# Patient Record
Sex: Female | Born: 1960 | Race: Black or African American | Hispanic: No | Marital: Married | Smoking: Never smoker
Health system: Southern US, Community
[De-identification: ages and names within clinical notes are randomized; demographics above are authoritative.]

## PROBLEM LIST (undated history)

## (undated) ENCOUNTER — Emergency Department (HOSPITAL_COMMUNITY): Discharge status: Absent without leave | Payer: Medicare Other

## (undated) DIAGNOSIS — R569 Unspecified convulsions: Secondary | ICD-10-CM

## (undated) DIAGNOSIS — I639 Cerebral infarction, unspecified: Secondary | ICD-10-CM

## (undated) DIAGNOSIS — E119 Type 2 diabetes mellitus without complications: Secondary | ICD-10-CM

## (undated) DIAGNOSIS — D509 Iron deficiency anemia, unspecified: Secondary | ICD-10-CM

## (undated) DIAGNOSIS — R519 Headache, unspecified: Secondary | ICD-10-CM

## (undated) DIAGNOSIS — R51 Headache: Secondary | ICD-10-CM

## (undated) DIAGNOSIS — E669 Obesity, unspecified: Secondary | ICD-10-CM

## (undated) DIAGNOSIS — I10 Essential (primary) hypertension: Secondary | ICD-10-CM

## (undated) HISTORY — DX: Unspecified convulsions: R56.9

## (undated) HISTORY — DX: Type 2 diabetes mellitus without complications: E11.9

## (undated) HISTORY — DX: Cerebral infarction, unspecified: I63.9

## (undated) HISTORY — DX: Headache: R51

## (undated) HISTORY — DX: Headache, unspecified: R51.9

---

## 1998-08-12 ENCOUNTER — Encounter: Payer: Self-pay | Admitting: Emergency Medicine

## 1998-08-12 ENCOUNTER — Emergency Department (HOSPITAL_COMMUNITY): Admission: EM | Admit: 1998-08-12 | Discharge: 1998-08-12 | Payer: Self-pay | Admitting: Emergency Medicine

## 2002-01-23 ENCOUNTER — Emergency Department (HOSPITAL_COMMUNITY): Admission: EM | Admit: 2002-01-23 | Discharge: 2002-01-24 | Payer: Self-pay | Admitting: Emergency Medicine

## 2004-08-12 ENCOUNTER — Emergency Department (HOSPITAL_COMMUNITY): Admission: EM | Admit: 2004-08-12 | Discharge: 2004-08-12 | Payer: Self-pay | Admitting: Emergency Medicine

## 2006-08-31 ENCOUNTER — Emergency Department (HOSPITAL_COMMUNITY): Admission: EM | Admit: 2006-08-31 | Discharge: 2006-08-31 | Payer: Self-pay | Admitting: Emergency Medicine

## 2007-10-27 ENCOUNTER — Emergency Department (HOSPITAL_COMMUNITY): Admission: EM | Admit: 2007-10-27 | Discharge: 2007-10-27 | Payer: Self-pay | Admitting: Family Medicine

## 2009-02-14 ENCOUNTER — Emergency Department (HOSPITAL_COMMUNITY): Admission: EM | Admit: 2009-02-14 | Discharge: 2009-02-14 | Payer: Self-pay | Admitting: Family Medicine

## 2010-04-25 HISTORY — PX: CARDIAC CATHETERIZATION: SHX172

## 2010-05-17 ENCOUNTER — Inpatient Hospital Stay (HOSPITAL_COMMUNITY)
Admission: EM | Admit: 2010-05-17 | Discharge: 2010-05-23 | DRG: 194 | Disposition: A | Discharge status: Home / Self Care | Payer: Self-pay | Attending: Internal Medicine | Admitting: Internal Medicine

## 2010-05-17 ENCOUNTER — Inpatient Hospital Stay (INDEPENDENT_AMBULATORY_CARE_PROVIDER_SITE_OTHER)
Admission: RE | Admit: 2010-05-17 | Discharge: 2010-05-17 | Disposition: A | Discharge status: Home / Self Care | Payer: Self-pay | Source: Ambulatory Visit | Attending: Family Medicine | Admitting: Family Medicine

## 2010-05-17 ENCOUNTER — Ambulatory Visit (INDEPENDENT_AMBULATORY_CARE_PROVIDER_SITE_OTHER): Payer: Self-pay

## 2010-05-17 ENCOUNTER — Emergency Department (HOSPITAL_COMMUNITY): Payer: Self-pay

## 2010-05-17 DIAGNOSIS — M79609 Pain in unspecified limb: Secondary | ICD-10-CM

## 2010-05-17 DIAGNOSIS — Z6841 Body Mass Index (BMI) 40.0 and over, adult: Secondary | ICD-10-CM

## 2010-05-17 DIAGNOSIS — J189 Pneumonia, unspecified organism: Principal | ICD-10-CM | POA: Diagnosis present

## 2010-05-17 DIAGNOSIS — R000 Tachycardia, unspecified: Secondary | ICD-10-CM | POA: Diagnosis present

## 2010-05-17 DIAGNOSIS — IMO0002 Reserved for concepts with insufficient information to code with codable children: Secondary | ICD-10-CM | POA: Diagnosis present

## 2010-05-17 DIAGNOSIS — M1710 Unilateral primary osteoarthritis, unspecified knee: Secondary | ICD-10-CM | POA: Diagnosis present

## 2010-05-17 DIAGNOSIS — Z8249 Family history of ischemic heart disease and other diseases of the circulatory system: Secondary | ICD-10-CM

## 2010-05-17 DIAGNOSIS — D72829 Elevated white blood cell count, unspecified: Secondary | ICD-10-CM | POA: Diagnosis present

## 2010-05-17 DIAGNOSIS — M5430 Sciatica, unspecified side: Secondary | ICD-10-CM

## 2010-05-17 DIAGNOSIS — E876 Hypokalemia: Secondary | ICD-10-CM | POA: Diagnosis present

## 2010-05-17 DIAGNOSIS — Z7982 Long term (current) use of aspirin: Secondary | ICD-10-CM

## 2010-05-17 DIAGNOSIS — D509 Iron deficiency anemia, unspecified: Secondary | ICD-10-CM | POA: Diagnosis present

## 2010-05-17 DIAGNOSIS — R0789 Other chest pain: Secondary | ICD-10-CM | POA: Diagnosis present

## 2010-05-17 DIAGNOSIS — I10 Essential (primary) hypertension: Secondary | ICD-10-CM | POA: Diagnosis present

## 2010-05-17 LAB — DIFFERENTIAL
Basophils Absolute: 0 10*3/uL (ref 0.0–0.1)
Basophils Relative: 0 % (ref 0–1)
Eosinophils Absolute: 0.3 10*3/uL (ref 0.0–0.7)
Eosinophils Relative: 2 % (ref 0–5)
Lymphocytes Relative: 37 % (ref 12–46)
Lymphs Abs: 5.1 10*3/uL — ABNORMAL HIGH (ref 0.7–4.0)
Monocytes Absolute: 1 10*3/uL (ref 0.1–1.0)
Monocytes Relative: 7 % (ref 3–12)
Neutro Abs: 7.5 10*3/uL (ref 1.7–7.7)
Neutrophils Relative %: 54 % (ref 43–77)

## 2010-05-17 LAB — POCT I-STAT, CHEM 8
Creatinine, Ser: 0.9 mg/dL (ref 0.4–1.2)
Glucose, Bld: 101 mg/dL — ABNORMAL HIGH (ref 70–99)
Hemoglobin: 11.6 g/dL — ABNORMAL LOW (ref 12.0–15.0)
Potassium: 3.8 mEq/L (ref 3.5–5.1)

## 2010-05-17 LAB — CBC
HCT: 30.6 % — ABNORMAL LOW (ref 36.0–46.0)
Hemoglobin: 9 g/dL — ABNORMAL LOW (ref 12.0–15.0)
MCH: 18.9 pg — ABNORMAL LOW (ref 26.0–34.0)
MCHC: 29.4 g/dL — ABNORMAL LOW (ref 30.0–36.0)
MCV: 64.2 fL — ABNORMAL LOW (ref 78.0–100.0)
Platelets: 425 10*3/uL — ABNORMAL HIGH (ref 150–400)
RBC: 4.77 MIL/uL (ref 3.87–5.11)
RDW: 18.2 % — ABNORMAL HIGH (ref 11.5–15.5)
WBC: 13.9 10*3/uL — ABNORMAL HIGH (ref 4.0–10.5)

## 2010-05-17 LAB — POCT CARDIAC MARKERS
CKMB, poc: 2 ng/mL (ref 1.0–8.0)
CKMB, poc: <1 ng/mL — ABNORMAL LOW (ref 1.0–8.0)
Myoglobin, poc: 69.9 ng/mL (ref 12–200)
Myoglobin, poc: 85.9 ng/mL (ref 12–200)
Troponin i, poc: <0.05 ng/mL (ref 0.00–0.09)
Troponin i, poc: <0.05 ng/mL (ref 0.00–0.09)

## 2010-05-18 ENCOUNTER — Observation Stay (HOSPITAL_COMMUNITY): Payer: Self-pay

## 2010-05-18 LAB — FERRITIN: Ferritin: 15 ng/mL (ref 10–291)

## 2010-05-18 LAB — CARDIAC PANEL(CRET KIN+CKTOT+MB+TROPI)
CK, MB: 2 ng/mL (ref 0.3–4.0)
Relative Index: 1.1 (ref 0.0–2.5)
Relative Index: 1.2 (ref 0.0–2.5)
Troponin I: 0.05 ng/mL (ref 0.00–0.06)

## 2010-05-18 LAB — CBC
HCT: 30 % — ABNORMAL LOW (ref 36.0–46.0)
MCH: 18.4 pg — ABNORMAL LOW (ref 26.0–34.0)
MCV: 64.1 fL — ABNORMAL LOW (ref 78.0–100.0)
Platelets: 440 10*3/uL — ABNORMAL HIGH (ref 150–400)
RBC: 4.68 MIL/uL (ref 3.87–5.11)

## 2010-05-18 LAB — URINE MICROSCOPIC-ADD ON

## 2010-05-18 LAB — BRAIN NATRIURETIC PEPTIDE: Pro B Natriuretic peptide (BNP): 58 pg/mL (ref 0.0–100.0)

## 2010-05-18 LAB — DIFFERENTIAL
Basophils Absolute: 0.1 10*3/uL (ref 0.0–0.1)
Basophils Relative: 1 % (ref 0–1)
Eosinophils Absolute: 0.2 10*3/uL (ref 0.0–0.7)
Lymphs Abs: 4.3 10*3/uL — ABNORMAL HIGH (ref 0.7–4.0)
Monocytes Relative: 7 % (ref 3–12)
Neutro Abs: 5.4 10*3/uL (ref 1.7–7.7)

## 2010-05-18 LAB — BASIC METABOLIC PANEL
BUN: 9 mg/dL (ref 6–23)
Chloride: 105 mEq/L (ref 96–112)
Creatinine, Ser: 0.71 mg/dL (ref 0.4–1.2)
Glucose, Bld: 118 mg/dL — ABNORMAL HIGH (ref 70–99)
Potassium: 3.6 mEq/L (ref 3.5–5.1)

## 2010-05-18 LAB — CK TOTAL AND CKMB (NOT AT ARMC): Relative Index: 1.2 (ref 0.0–2.5)

## 2010-05-18 LAB — URINALYSIS, ROUTINE W REFLEX MICROSCOPIC
Nitrite: NEGATIVE
Specific Gravity, Urine: 1.011 (ref 1.005–1.030)
Urobilinogen, UA: 0.2 mg/dL (ref 0.0–1.0)
pH: 7 (ref 5.0–8.0)

## 2010-05-18 LAB — IRON AND TIBC
Iron: 21 ug/dL — ABNORMAL LOW (ref 42–135)
Saturation Ratios: 7 % — ABNORMAL LOW (ref 20–55)
TIBC: 315 ug/dL (ref 250–470)
UIBC: 294 ug/dL

## 2010-05-18 LAB — TROPONIN I: Troponin I: 0.05 ng/mL (ref 0.00–0.06)

## 2010-05-19 ENCOUNTER — Observation Stay (HOSPITAL_COMMUNITY): Payer: Self-pay

## 2010-05-19 LAB — COMPREHENSIVE METABOLIC PANEL
ALT: 16 U/L (ref 0–35)
Albumin: 3.1 g/dL — ABNORMAL LOW (ref 3.5–5.2)
Alkaline Phosphatase: 59 U/L (ref 39–117)
BUN: 10 mg/dL (ref 6–23)
Chloride: 100 mEq/L (ref 96–112)
Glucose, Bld: 107 mg/dL — ABNORMAL HIGH (ref 70–99)
Potassium: 3.5 mEq/L (ref 3.5–5.1)
Sodium: 140 mEq/L (ref 135–145)
Total Bilirubin: 0.5 mg/dL (ref 0.3–1.2)
Total Protein: 7.9 g/dL (ref 6.0–8.3)

## 2010-05-19 LAB — LIPID PANEL
LDL Cholesterol: 37 mg/dL (ref 0–99)
Total CHOL/HDL Ratio: 2.5 RATIO
VLDL: 19 mg/dL (ref 0–40)

## 2010-05-19 LAB — CBC
HCT: 30.7 % — ABNORMAL LOW (ref 36.0–46.0)
Hemoglobin: 8.8 g/dL — ABNORMAL LOW (ref 12.0–15.0)
MCHC: 28.7 g/dL — ABNORMAL LOW (ref 30.0–36.0)
RBC: 4.8 MIL/uL (ref 3.87–5.11)
WBC: 12 10*3/uL — ABNORMAL HIGH (ref 4.0–10.5)

## 2010-05-20 ENCOUNTER — Observation Stay (HOSPITAL_COMMUNITY): Payer: Self-pay

## 2010-05-20 LAB — HEMOCCULT GUIAC POC 1CARD (OFFICE): Fecal Occult Bld: NEGATIVE

## 2010-05-20 MED ORDER — TECHNETIUM TC 99M TETROFOSMIN IV KIT
30.0000 | PACK | Freq: Once | INTRAVENOUS | Status: AC | PRN
Start: 2010-05-20 — End: 2010-05-20
  Administered 2010-05-20: 30 via INTRAVENOUS

## 2010-05-20 MED ORDER — TECHNETIUM TC 99M TETROFOSMIN IV KIT
30.0000 | PACK | Freq: Once | INTRAVENOUS | Status: AC | PRN
  Administered 2010-05-19: 30 via INTRAVENOUS

## 2010-05-21 ENCOUNTER — Observation Stay (HOSPITAL_COMMUNITY): Payer: Self-pay

## 2010-05-21 LAB — COMPREHENSIVE METABOLIC PANEL
ALT: 18 U/L (ref 0–35)
AST: 18 U/L (ref 0–37)
Albumin: 3.1 g/dL — ABNORMAL LOW (ref 3.5–5.2)
CO2: 30 mEq/L (ref 19–32)
Chloride: 100 mEq/L (ref 96–112)
Creatinine, Ser: 1.05 mg/dL (ref 0.4–1.2)
GFR calc Af Amer: >60 mL/min (ref >60)
GFR calc non Af Amer: 56 mL/min — ABNORMAL LOW (ref >60)
Potassium: 3.6 mEq/L (ref 3.5–5.1)
Sodium: 138 mEq/L (ref 135–145)
Total Bilirubin: 0.4 mg/dL (ref 0.3–1.2)

## 2010-05-21 LAB — CBC
HCT: 32.3 % — ABNORMAL LOW (ref 36.0–46.0)
Hemoglobin: 9.2 g/dL — ABNORMAL LOW (ref 12.0–15.0)
RBC: 5 MIL/uL (ref 3.87–5.11)
WBC: 11.7 10*3/uL — ABNORMAL HIGH (ref 4.0–10.5)

## 2010-05-21 LAB — D-DIMER, QUANTITATIVE: D-Dimer, Quant: 2.1 ug/mL-FEU — ABNORMAL HIGH (ref 0.00–0.48)

## 2010-05-21 MED ORDER — IOHEXOL 350 MG/ML SOLN
100.0000 mL | Freq: Once | INTRAVENOUS | Status: AC | PRN
  Administered 2010-05-21: 100 mL via INTRAVENOUS

## 2010-05-22 LAB — HEMOCCULT GUIAC POC 1CARD (OFFICE): Fecal Occult Bld: NEGATIVE

## 2010-05-23 LAB — CBC
HCT: 32.8 % — ABNORMAL LOW (ref 36.0–46.0)
Hemoglobin: 9.5 g/dL — ABNORMAL LOW (ref 12.0–15.0)
MCHC: 29 g/dL — ABNORMAL LOW (ref 30.0–36.0)
MCV: 65.6 fL — ABNORMAL LOW (ref 78.0–100.0)
RDW: 20.1 % — ABNORMAL HIGH (ref 11.5–15.5)

## 2010-05-23 LAB — BASIC METABOLIC PANEL
BUN: 16 mg/dL (ref 6–23)
CO2: 29 mEq/L (ref 19–32)
Calcium: 9.2 mg/dL (ref 8.4–10.5)
Glucose, Bld: 103 mg/dL — ABNORMAL HIGH (ref 70–99)
Potassium: 4 mEq/L (ref 3.5–5.1)
Sodium: 141 mEq/L (ref 135–145)

## 2010-05-24 LAB — CULTURE, BLOOD (ROUTINE X 2)
Culture  Setup Time: 201202241105
Culture: NO GROWTH

## 2010-06-05 NOTE — Discharge Summary (Signed)
[name], [name]                ACCOUNT NO.:  [id number]  MEDICAL RECORD NO.:  [id number]           PATIENT TYPE:  LOCATION:                                 FACILITY:  PHYSICIAN:  Erick Blinks, MD     DATE OF BIRTH:  Mar 22, 1961  DATE OF ADMISSION: DATE OF DISCHARGE:                              DISCHARGE SUMMARY   PRIMARY CARE PHYSICIAN:  The patient was seen in Urgent Care.  DISCHARGE DIAGNOSES: 1. Community-acquired pneumonia, treated. 2. Atypical chest pain with negative cardiac catheterization for     medical management. 3. Morbid obesity. 4. Microcytic anemia, iron-deficiency. 5. Right knee osteoarthritis. 6. Hypertension. 7. Hypokalemia.  DISCHARGE MEDICATIONS: 1. Aspirin 81 mg p.o. daily. 2. Ferrous sulfate 325 mg by mouth 3 times a day with meals. 3. Losartan 15 mg by mouth daily. 4. Metoprolol 100 mg p.o. b.i.d. 5. Oxycodone/APAP 5/325 mg 1-2 tablets by mouth q.3 h. p.r.n. as     needed. 6. Advil 1-2 tablets by mouth twice daily as needed. 7. Tylenol over-the-counter 1-2 tablet by mouth twice daily as needed. 8. Aspirin/caffeine over-the-counter 1-2 packets by mouth twice daily     as needed.  ADMISSION HISTORY:  A 50 year old African American female who presents to Urgent Care with complaints of back pain and knee pain.  She reported some chest pain with some palpation on the anterior chest wall.  EKG done at the Urgent Care showed sinus tachycardia with diffuse T-wave strain pattern.  She was sent to the emergency room for evaluation.  In the ER, she was found to be hypertensive with blood pressure of 195/87, also tachycardic with a heart rate of 115.  The patient was subsequently admitted for further evaluation.  For further details, please refer to the history and physical dictated by Dr. Phineas Douglas.  HOSPITAL COURSE: 1. Atypical chest pain.  The patient was seen in consultation by Dr.     Sharyn Lull from Cardiology.  She had a nuclear stress test done,  which     showed mild-to-moderate reversibility involving the anterior septum     and inferior lateral wall, which may be due to artifact.  She     subsequently underwent a cardiac catheterization on February 28,     which showed patent coronaries and preserved ejection fraction.     Medical management was recommended.  She also had a CT angio of her     chest, which was negative for any pulmonary embolism. 2. Community-acquired pneumonia.  The patient was treated empirically     with Levaquin.  She is completed a course of antibiotic.  She is     afebrile.  Does not have any shortness of breath or cough at this     time.  We will not send her out on any antibiotics. 3. Iron-deficiency anemia.  The patient was found to be significantly     microcytic with an MCV of 64 and hemoglobin of 9.  Iron level was     low at 21.  Ferritin was low as 15.  The patient was started on     iron supplementation.  She reports having heavy menstrual cycle,     which is likely contributing to this.  She will need to follow up     as an outpatient with her primary care doctor to repeat her CBC in     the next 2 to 3 weeks to ensure that her hemoglobin is stable. 4. Knee pain.  The patient had x-rays done, which showed arthritis.     She was advised for exercise and diet.  CONSULTATIONS:  Cardiology, Dr. Sharyn Lull.  PROCEDURES:  Cardiac catheterization on February 28 with patent coronaries and preserved ejection fraction.  DIAGNOSTIC IMAGING: 1. Chest x-ray on February 23 showed stable cardiomegaly with some     increasing mild perihilar and bibasilar interstitial edema. 2. Chest x-ray on February 24 showed mild congestive heart failure, is     slightly progressing with left lower lobe airspace disease, which     may be atelectasis or infiltrate. 3. Nuclear stress test on February 26 shows diminished of exam due to     the patient's body habitus.  There is mild-to-moderate     reversibility involving the  anterior septum and inferior lateral     wall, this may be a soft tissue attenuation artifact, myocardial     reversibility cannot be excluded, normal left ventricular systolic     function, EF of 65%. 4. Chest x-ray from February 26 shows cardiomegaly without edema. 5. X-ray of the knee on February 26 shows no acute bony pathology,     mild degenerative changes. 6. CT angio of the chest on February 27 shows no evidence of acute     pulmonary embolism, bilateral axillary lymphadenopathy.  DISCHARGE INSTRUCTIONS:  The patient should follow up with her primary care physician within the next 1-2 weeks.  We will have case management set her up with the primary care physician for followup.  She will also see Dr. Sharyn Lull next week and follow up.  She has been advised to continue on a low-salt, low-calorie diet, and has been advised to conduct her activity as tolerated.  She has been recommended to follow up in an exercise program.  Plan was discussed with the patient who was also in agreement.  Time spent on this discharge is 50 minutes.     Erick Blinks, MD     JM/MEDQ  D:  05/23/2010  T:  05/24/2010  Job:  [id number]  Electronically Signed by Durward Mallard MEMON  on 06/05/2010 06:06:25 PM

## 2010-06-12 NOTE — H&P (Signed)
[name], [name]                ACCOUNT NO.:  [id number]  MEDICAL RECORD NO.:  [id number]           PATIENT TYPE:  E  LOCATION:  [facility]                         FACILITY:  [facility]  PHYSICIAN:  Homero Fellers, MD   DATE OF BIRTH:  [birth year]  DATE OF ADMISSION:  05/17/2010 DATE OF DISCHARGE:                             HISTORY & PHYSICAL   PRIMARY CARE PHYSICIAN:  None.  CHIEF COMPLAINT:  Knee pain, back pain, and back spasm.  HISTORY OF PRESENT ILLNESS:  This is a 50 year old African American obese female who initially went to Urgent Care with back pain and knee pain with associated back and chest spasm.  She denied any definite chest pain, but she experienced some tenderness and palpation on chest wall.  She had an EKG done at Urgent Care, which showed sinus tachycardia, which diffusely flipped T-wave strain pattern.  The patient was subsequently admitted to the emergency room for further evaluation. She had two set of cardiac enzymes, which came back negative.  She denied any fever.  She has occasional cough.  No nausea or vomiting.  No diaphoresis.  She has mild leg swelling bilaterally.  No previous workup for coronary artery disease.  In the emergency room, her blood pressure was found to be elevated as high as 195/87.  She has not been previously diagnosed with high blood pressure.  She also had elevated heart rate up to 115 and she was tachypneic at times.  In view of risk factors for coronary artery disease, presence of leukocytes and abnormal chest x-ray which implied bibasilar infiltrates and a macrocytic anemia, the patient is going to be admitted for further workup and management.  PAST MEDICAL HISTORY:  None prior to presentation.  MEDICATIONS:  None.  ALLERGIES:  None.  SOCIAL HISTORY:  No smoking, alcohol or drugs.  FAMILY HISTORY:  Mother died of myocardial infarction, kidney and heart failure at age 58.  Ten-point review of systems is essentially  negative except as above.  The patient denies any urinary symptoms.  No fever. No headaches.  No neck swelling.  No abdominal pain, diarrhea, or constipation.  PHYSICAL EXAMINATION:  VITAL SIGNS:  Blood pressure 164/78 to 195/87, pulse 110-115, respirations 20-26, temperature is 98.2. GENERAL:  The patient is lying in bed comfortable in no distress. HEENT:  Pallor.  Extraocular muscles were intact. NECK:  Supple.  No JVD, adenopathy, or thyromegaly. LUNGS:  Show reduced breath sounds at the bases.  No wheezing or crackles. HEART:  S1-S2.  Regular rate and rhythm.  No murmurs, rubs or gallop. ABDOMEN:  Full, obese, soft, nontender.  Bowel sounds present.  No masses. EXTREMITIES:  Trace edema bilaterally. NEUROLOGIC:  No focal deficits. SKIN:  No rash or lesion.  LABORATORY:  Chemistries not available.  White count 13.9 with no significant shift, hemoglobin 9, MCV 64.2, platelet count 425. Urinalysis is pending.  BNP 58.  Chest x-ray showed bibasilar infiltrate versus interstitial edema.  EKG showed sinus tachycardia with strainpattern on diffusely filled T-waves.  ASSESSMENT:  This is a 50 year old woman admitted with, 1. Questionable bibasilar pneumonia.  I do not think she  has any     congestive heart failure going by the further.  BNP is within     normal limits.  Ioban rule out for leg swelling and these findings     on chest x-ray, I think a 2-D echo would be reasonable. 2. Abnormal EKG.  The patient with risk factors for coronary artery     disease including newly-diagnosed high blood pressure and positive     family history as well as obesity.  I will order nuclear stress     test to rule out ischemia. 3. Microcytic anemia.  The patient will give further anemia workup     including ferritin, iron and TIBC, retic count, fecal occult blood,     B12 and folic acid levels.  The patient would be on beta-blocker     for blood pressure.  We will also add hydralazine for very  severe     elevated blood pressure.  I will also add a low-dose aspirin, pain     medication and a deep venous thrombosis prophylaxis.  For possible     pneumonia, she will be given Levaquin.  Urinalysis and blood     culture will be sent.  Her condition is stable.     Homero Fellers, MD     FA/MEDQ  D:  05/18/2010  T:  05/18/2010  Job:  [id number]  Electronically Signed by Homero Fellers  on 06/12/2010 02:08:31 AM

## 2010-06-28 NOTE — Procedures (Signed)
[name], [name]                ACCOUNT NO.:  [id number]  MEDICAL RECORD NO.:  [id number]           PATIENT TYPE:  I  LOCATION:  2034                         FACILITY:  [facility]  PHYSICIAN:  Eduardo Osier. Sharyn Lull, M.D. DATE OF BIRTH:  07-02-1960  DATE OF PROCEDURE:  05/22/2010 DATE OF DISCHARGE:                           CARDIAC CATHETERIZATION   PROCEDURE:  Left cardiac catheterization with selective left and right coronary angiography, left ventriculography via right groin using Judkins technique.  INDICATIONS FOR PROCEDURE:  [name] is a 50 year old black female with past medical history significant for morbid obesity, positive family history of coronary artery disease.  She came to the ER via urgent care complaining of right knee, back, and leg spasm associated with chest pain and mild shortness of breath, and was noted to have elevated blood pressure and ST-T wave changes in lateral leads.  The patient gives history of exertional chest pain and exertional dyspnea, but states yesterday's pain increased with local pressure and movement.  Denies any nausea, vomiting, diaphoresis.  Also complains of cough with whitish phlegm.  No fever or chills.  Denies palpitation, lightheadedness, or syncope.  Denies PND, orthopnea, but complains of occasional leg swelling.  PAST MEDICAL HISTORY:  As above.  PAST SURGICAL HISTORY:  She had C-section x2 in the past.  ALLERGIES:  No known drug allergies.  MEDICATIONS:  None.  SOCIAL HISTORY:  She is divorced and widowed, has two children.  No history of smoking or alcohol abuse.  She works as Water engineer.  FAMILY HISTORY:  Mother died at the age of 63 due to questionable heart problems and lung problems.  She also had kidney failure.  Father died of old age.  She was diabetic, one sister is hypertensive.  PHYSICAL EXAMINATION:  VITAL SIGNS:  Blood pressure was 162/82, pulse was 92. HEENT:  Conjunctivae are pink. NECK:  Supple.  No  JVD. LUNGS:  Decreased breath sounds at bases. CARDIOVASCULAR:  S1 and S2 was normal.  There was soft systolic murmur and S4 gallop. ABDOMEN:  Soft, obese, nontender. EXTREMITIES:  There is no clubbing, cyanosis.  There was 2+ edema.  LABORATORY DATA:  Her hemoglobin was 8.6, hematocrit 30, white count of 10.8.  Potassium was 3.6, BUN 9, creatinine 0.71, glucose was 118.  Her two sets of cardiac enzymes, CPK-MB and troponin I were negative.  EKG showed normal sinus rhythm with septal Q waves, ST-T wave changes in the lateral leads, and nonspecific ST-T wave changes in the inferior leads. The patient was admitted to telemetry unit.  MI was ruled out by serial enzymes and EKG.  The patient subsequently underwent Persantine Myoview, which showed mild-to-moderate reversible ischemia in the anterior septum and inferolateral wall with EF of 65%.  The patient also had elevated D- dimers, had CT of the chest, which was negative for PE.  Discussed with the patient regarding Persantine Myoview result and left cath, possible PTCA stenting, its risks and benefits i.e. death, MI, stroke, need for emergency CABG.  Risk of restenosis, local vascular complications, etc., and consented for the procedure.  PROCEDURE:  After obtaining the  informed consent, the patient was brought to the cath lab and was placed on fluoroscopy table.  Right groin was prepped and draped in usual fashion.  Xylocaine 1% was used for local anesthesia in the right groin.  With the help of thin-wall needle, a 5-French arterial sheath was placed.  The sheath was aspirated and flushed.  Next, 5-French left Judkins catheter was advanced over the wire under fluoroscopic guidance up to the ascending aorta.  Wire was pulled out, the catheter was aspirated and connected to the manifold. Catheter was further advanced and engaged into left coronary ostium. Multiple views of the left system were taken.  Next, the catheter was disengaged  and was pulled out over the wire and was replaced with a 5- [place] right Judkins catheter, which was advanced over the wire under fluoroscopic guidance up to the ascending aorta, wire was pulled out, the catheter was aspirated and connected to the manifold.  Catheter was further advanced and engaged into right coronary ostium.  Multiple views of the right system were taken.  Next, catheter was disengaged and was pulled out over the wire and was replaced with 5-French pigtail catheter, which was advanced over the wire under fluoroscopic guidance into the ascending aorta, wire was pulled out, the catheter was aspirated and connected to the manifold.  Catheter was further advanced across the aortic valve into the LV.  LV pressures were recorded.  Next, LV graft was done in 30-degree RAO position.  Post angiographic pressures were recorded from LV and then pullback pressures were recorded from the aorta.  There was no gradient across the aortic valve. Next, the pigtail catheter was pulled out over the wire.  Sheaths were aspirated and flushed.  FINDINGS:  LV showed good LV systolic function, mild LVH, EF of 55-60%. Left main was patent.  LAD has 5-10% proximal stenosis.  Diagonal 1 is small, which is patent.  Ramus is moderate size, which is patent.  Left circumflex is large, which is patent, which tapers down in AV groove after giving off OM-2.  OM-1 is very small.  OM-2 is large, which is patent.  RCA is large, which is patent.  PDA and PLV branches were small, which were patent.  The patient tolerated the procedure well. There are no complications.  The patient was transferred to recovery room in stable condition.     Eduardo Osier. Sharyn Lull, M.D.     MNH/MEDQ  D:  05/22/2010  T:  05/23/2010  Job:  [id number]  cc:   [facility]  Electronically Signed by Rinaldo Cloud M.D. on 06/28/2010 10:24:30 PM

## 2011-03-19 ENCOUNTER — Emergency Department (HOSPITAL_COMMUNITY): Payer: Self-pay

## 2011-03-19 ENCOUNTER — Emergency Department (HOSPITAL_COMMUNITY)
Admission: EM | Admit: 2011-03-19 | Discharge: 2011-03-20 | Disposition: A | Discharge status: Home / Self Care | Payer: Self-pay | Attending: Emergency Medicine | Admitting: Emergency Medicine

## 2011-03-19 DIAGNOSIS — R51 Headache: Secondary | ICD-10-CM | POA: Insufficient documentation

## 2011-03-19 DIAGNOSIS — R062 Wheezing: Secondary | ICD-10-CM | POA: Insufficient documentation

## 2011-03-19 DIAGNOSIS — J40 Bronchitis, not specified as acute or chronic: Secondary | ICD-10-CM | POA: Insufficient documentation

## 2011-03-19 DIAGNOSIS — R070 Pain in throat: Secondary | ICD-10-CM | POA: Insufficient documentation

## 2011-03-19 DIAGNOSIS — J3489 Other specified disorders of nose and nasal sinuses: Secondary | ICD-10-CM | POA: Insufficient documentation

## 2011-03-19 DIAGNOSIS — R05 Cough: Secondary | ICD-10-CM | POA: Insufficient documentation

## 2011-03-19 DIAGNOSIS — I10 Essential (primary) hypertension: Secondary | ICD-10-CM | POA: Insufficient documentation

## 2011-03-19 DIAGNOSIS — R059 Cough, unspecified: Secondary | ICD-10-CM | POA: Insufficient documentation

## 2011-03-19 DIAGNOSIS — R000 Tachycardia, unspecified: Secondary | ICD-10-CM | POA: Insufficient documentation

## 2011-03-19 HISTORY — DX: Essential (primary) hypertension: I10

## 2011-03-19 MED ORDER — DEXAMETHASONE 4 MG PO TABS
10.0000 mg | ORAL_TABLET | ORAL | Status: AC
  Administered 2011-03-19: 10 mg via ORAL
  Filled 2011-03-19: qty 2.5

## 2011-03-19 MED ORDER — ALBUTEROL SULFATE HFA 108 (90 BASE) MCG/ACT IN AERS: 2.0000 | INHALATION_SPRAY | Freq: Four times a day (QID) | RESPIRATORY_TRACT | Status: DC

## 2011-03-19 MED ORDER — AZITHROMYCIN 250 MG PO TABS: 250.0000 mg | ORAL_TABLET | Freq: Every day | ORAL | Status: AC

## 2011-03-19 MED ORDER — AZITHROMYCIN 250 MG PO TABS
500.0000 mg | ORAL_TABLET | Freq: Once | ORAL | Status: AC
  Administered 2011-03-19: 500 mg via ORAL
  Filled 2011-03-19: qty 2

## 2011-03-19 MED ORDER — ALBUTEROL SULFATE (5 MG/ML) 0.5% IN NEBU
2.5000 mg | INHALATION_SOLUTION | Freq: Once | RESPIRATORY_TRACT | Status: AC
  Administered 2011-03-19: 2.5 mg via RESPIRATORY_TRACT
  Filled 2011-03-19: qty 0.5

## 2011-03-19 MED ORDER — PREDNISONE 10 MG PO TABS: 50.0000 mg | ORAL_TABLET | Freq: Every day | ORAL | Status: DC

## 2011-03-19 MED ORDER — LIDOCAINE VISCOUS 2 % MT SOLN
20.0000 mL | Freq: Once | OROMUCOSAL | Status: AC
  Administered 2011-03-19: 20 mL via OROMUCOSAL
  Filled 2011-03-19: qty 15

## 2011-03-19 NOTE — ED Provider Notes (Signed)
History     CSN: [id number]  Arrival date & time 03/19/11  2120   First MD Initiated Contact with Patient 03/19/11 2158      Chief Complaint  Patient presents with  . Sore Throat    (Consider location/radiation/quality/duration/timing/severity/associated sxs/prior treatment) HPI The patient presents with 2 days of sore throat, cough, congestion. She notes that her symptoms began gradually, since onset has been persistent. There is associated discomfort, described as burning up into the lower throat. This pain is not relieved by aspirin or ibuprofen, is nonradiating.  The patient denies any fevers, chills, vomiting, disorientation, or any chest pain. The patient has no noted medical problems, and notes that she had significant headache recently with multiple sick grandchildren Past Medical History  Diagnosis Date  . Hypertension     Past Surgical History  Procedure Date  . Cardiac catheterization     No family history on file.  History  Substance Use Topics  . Smoking status: Never Smoker   . Smokeless tobacco: Not on file  . Alcohol Use: No    OB History    Grav Para Term Preterm Abortions TAB SAB Ect Mult Living                  Review of Systems  Constitutional:       HPI  HENT:       HPI otherwise negative  Eyes: Negative.   Respiratory:       HPI, otherwise negative  Cardiovascular:       HPI, otherwise nmegative  Gastrointestinal: Negative for vomiting.  Genitourinary:       HPI, otherwise negative  Musculoskeletal:       HPI, otherwise negative  Skin: Negative.   Neurological: Negative for syncope.    Allergies  Review of patient's allergies indicates no known allergies.  Home Medications  No current outpatient prescriptions on file.  BP 216/113  Pulse 120  Temp(Src) 98.4 F (36.9 C) (Oral)  Resp 22  SpO2 99%  LMP 02/17/2011  Physical Exam  Nursing note and vitals reviewed. Constitutional: She is oriented to person, place, and time.  She appears well-developed and well-nourished. She appears distressed.  HENT:  Head: Normocephalic and atraumatic.  Eyes: Conjunctivae and EOM are normal. Pupils are equal, round, and reactive to light.  Cardiovascular: Regular rhythm.  Tachycardia present.   Pulmonary/Chest: Effort normal. No accessory muscle usage or stridor. She has no decreased breath sounds. She has wheezes.  Abdominal: Soft. She exhibits no distension.  Musculoskeletal: She exhibits no edema and no tenderness.  Neurological: She is alert and oriented to person, place, and time. No cranial nerve deficit.  Skin: Skin is warm and dry.  Psychiatric: She has a normal mood and affect.    ED Course  Procedures (including critical care time)  Labs Reviewed - No data to display No results found.   No diagnosis found.   11:31 PM Patient notes some improvement following ED interventions. 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  Chest x-ray, reviewed by me, consistent with bronchitis MDM  This previously well female now presents with several days of cough, sore throat, congestion. On exam she is in no distress, is afebrile. The patient's x-ray suggests bronchitis. The patient improved substantially following ED interventions, had decreased work of breathing, was notably more comfortable. She was discharged in stable condition to follow up with primary care physician for this episode is most consistent with bronchitis.  Gerhard Munch, MD 03/19/11 [phone]

## 2011-03-19 NOTE — ED Notes (Signed)
Patient presents with c/o sore throat for 2 days and it started burning today.  +cough unable to cough up anything

## 2011-03-19 NOTE — ED Notes (Signed)
Pt c/o sore throat, and congestion with SOB

## 2015-10-06 ENCOUNTER — Emergency Department (HOSPITAL_COMMUNITY): Payer: Medicaid Other

## 2015-10-06 ENCOUNTER — Inpatient Hospital Stay (HOSPITAL_COMMUNITY)
Admission: EM | Admit: 2015-10-06 | Discharge: 2015-10-12 | DRG: 065 | Disposition: A | Discharge status: Rehabilitation Facility | Payer: Medicaid Other | Attending: Neurology | Admitting: Neurology

## 2015-10-06 ENCOUNTER — Encounter (HOSPITAL_COMMUNITY): Payer: Self-pay

## 2015-10-06 DIAGNOSIS — E119 Type 2 diabetes mellitus without complications: Secondary | ICD-10-CM | POA: Diagnosis present

## 2015-10-06 DIAGNOSIS — R569 Unspecified convulsions: Secondary | ICD-10-CM

## 2015-10-06 DIAGNOSIS — N179 Acute kidney failure, unspecified: Secondary | ICD-10-CM | POA: Insufficient documentation

## 2015-10-06 DIAGNOSIS — E876 Hypokalemia: Secondary | ICD-10-CM | POA: Diagnosis present

## 2015-10-06 DIAGNOSIS — E785 Hyperlipidemia, unspecified: Secondary | ICD-10-CM | POA: Diagnosis present

## 2015-10-06 DIAGNOSIS — I119 Hypertensive heart disease without heart failure: Secondary | ICD-10-CM | POA: Diagnosis present

## 2015-10-06 DIAGNOSIS — I619 Nontraumatic intracerebral hemorrhage, unspecified: Principal | ICD-10-CM | POA: Diagnosis present

## 2015-10-06 DIAGNOSIS — Z6841 Body Mass Index (BMI) 40.0 and over, adult: Secondary | ICD-10-CM

## 2015-10-06 DIAGNOSIS — D509 Iron deficiency anemia, unspecified: Secondary | ICD-10-CM | POA: Diagnosis present

## 2015-10-06 DIAGNOSIS — I10 Essential (primary) hypertension: Secondary | ICD-10-CM | POA: Insufficient documentation

## 2015-10-06 DIAGNOSIS — R5383 Other fatigue: Secondary | ICD-10-CM

## 2015-10-06 DIAGNOSIS — I2510 Atherosclerotic heart disease of native coronary artery without angina pectoris: Secondary | ICD-10-CM | POA: Diagnosis present

## 2015-10-06 DIAGNOSIS — G4733 Obstructive sleep apnea (adult) (pediatric): Secondary | ICD-10-CM | POA: Diagnosis present

## 2015-10-06 DIAGNOSIS — I5189 Other ill-defined heart diseases: Secondary | ICD-10-CM | POA: Insufficient documentation

## 2015-10-06 DIAGNOSIS — I611 Nontraumatic intracerebral hemorrhage in hemisphere, cortical: Secondary | ICD-10-CM

## 2015-10-06 DIAGNOSIS — I639 Cerebral infarction, unspecified: Secondary | ICD-10-CM

## 2015-10-06 DIAGNOSIS — R0682 Tachypnea, not elsewhere classified: Secondary | ICD-10-CM | POA: Insufficient documentation

## 2015-10-06 DIAGNOSIS — R197 Diarrhea, unspecified: Secondary | ICD-10-CM | POA: Diagnosis present

## 2015-10-06 DIAGNOSIS — R7989 Other specified abnormal findings of blood chemistry: Secondary | ICD-10-CM

## 2015-10-06 DIAGNOSIS — D62 Acute posthemorrhagic anemia: Secondary | ICD-10-CM | POA: Insufficient documentation

## 2015-10-06 DIAGNOSIS — I610 Nontraumatic intracerebral hemorrhage in hemisphere, subcortical: Secondary | ICD-10-CM

## 2015-10-06 DIAGNOSIS — R062 Wheezing: Secondary | ICD-10-CM | POA: Insufficient documentation

## 2015-10-06 HISTORY — DX: Iron deficiency anemia, unspecified: D50.9

## 2015-10-06 LAB — CBC WITH DIFFERENTIAL/PLATELET
BASOS ABS: 0 10*3/uL (ref 0.0–0.1)
Basophils Relative: 0 %
EOS ABS: 0 10*3/uL (ref 0.0–0.7)
EOS PCT: 0 %
HCT: 37.2 % (ref 36.0–46.0)
HEMOGLOBIN: 10.9 g/dL — AB (ref 12.0–15.0)
Lymphocytes Relative: 13 %
Lymphs Abs: 1.7 10*3/uL (ref 0.7–4.0)
MCH: 21.3 pg — AB (ref 26.0–34.0)
MCHC: 29.3 g/dL — ABNORMAL LOW (ref 30.0–36.0)
MCV: 72.7 fL — ABNORMAL LOW (ref 78.0–100.0)
MONO ABS: 0.5 10*3/uL (ref 0.1–1.0)
Monocytes Relative: 4 %
NEUTROS PCT: 83 %
Neutro Abs: 10.5 10*3/uL — ABNORMAL HIGH (ref 1.7–7.7)
Platelets: 262 10*3/uL (ref 150–400)
RBC: 5.12 MIL/uL — AB (ref 3.87–5.11)
RDW: 17.6 % — ABNORMAL HIGH (ref 11.5–15.5)
WBC: 12.7 10*3/uL — AB (ref 4.0–10.5)

## 2015-10-06 LAB — I-STAT ARTERIAL BLOOD GAS, ED
Acid-Base Excess: 4 mmol/L — ABNORMAL HIGH (ref 0.0–2.0)
BICARBONATE: 30.9 meq/L — AB (ref 20.0–24.0)
O2 SAT: 89 %
PCO2 ART: 53.2 mmHg — AB (ref 35.0–45.0)
PO2 ART: 58 mmHg — AB (ref 80.0–100.0)
Patient temperature: 98.6
TCO2: 32 mmol/L (ref 0–100)
pH, Arterial: 7.371 (ref 7.350–7.450)

## 2015-10-06 LAB — COMPREHENSIVE METABOLIC PANEL
ALT: 15 U/L (ref 14–54)
AST: 21 U/L (ref 15–41)
Albumin: 3.3 g/dL — ABNORMAL LOW (ref 3.5–5.0)
Alkaline Phosphatase: 68 U/L (ref 38–126)
Anion gap: 7 (ref 5–15)
BILIRUBIN TOTAL: 0.3 mg/dL (ref 0.3–1.2)
BUN: 14 mg/dL (ref 6–20)
CALCIUM: 9.1 mg/dL (ref 8.9–10.3)
CHLORIDE: 104 mmol/L (ref 101–111)
CO2: 30 mmol/L (ref 22–32)
CREATININE: 0.89 mg/dL (ref 0.44–1.00)
Glucose, Bld: 104 mg/dL — ABNORMAL HIGH (ref 65–99)
Potassium: 3 mmol/L — ABNORMAL LOW (ref 3.5–5.1)
Sodium: 141 mmol/L (ref 135–145)
TOTAL PROTEIN: 8 g/dL (ref 6.5–8.1)

## 2015-10-06 LAB — I-STAT TROPONIN, ED
TROPONIN I, POC: 0.09 ng/mL — AB (ref 0.00–0.08)
Troponin i, poc: 0.14 ng/mL (ref 0.00–0.08)

## 2015-10-06 LAB — CBG MONITORING, ED: Glucose-Capillary: 109 mg/dL — ABNORMAL HIGH (ref 65–99)

## 2015-10-06 LAB — BRAIN NATRIURETIC PEPTIDE: B Natriuretic Peptide: 92.7 pg/mL (ref 0.0–100.0)

## 2015-10-06 MED ORDER — CLEVIDIPINE BUTYRATE 0.5 MG/ML IV EMUL: 0.0000 mg/h | INTRAVENOUS | Status: DC

## 2015-10-06 MED ORDER — ACETAMINOPHEN 650 MG RE SUPP: 650.0000 mg | RECTAL | Status: DC | PRN

## 2015-10-06 MED ORDER — ASPIRIN 81 MG PO CHEW: 324.0000 mg | CHEWABLE_TABLET | Freq: Once | ORAL | Status: DC

## 2015-10-06 MED ORDER — LABETALOL HCL 5 MG/ML IV SOLN
20.0000 mg | Freq: Once | INTRAVENOUS | Status: DC
  Filled 2015-10-06: qty 4

## 2015-10-06 MED ORDER — IOPAMIDOL (ISOVUE-370) INJECTION 76%
INTRAVENOUS | Status: AC
  Administered 2015-10-06: 100 mL
  Filled 2015-10-06: qty 100

## 2015-10-06 MED ORDER — NITROGLYCERIN 2 % TD OINT
0.5000 [in_us] | TOPICAL_OINTMENT | Freq: Four times a day (QID) | TRANSDERMAL | Status: DC
  Administered 2015-10-06 – 2015-10-10 (×13): 0.5 [in_us] via TOPICAL
  Filled 2015-10-06: qty 30
  Filled 2015-10-06: qty 1

## 2015-10-06 MED ORDER — SENNOSIDES-DOCUSATE SODIUM 8.6-50 MG PO TABS
1.0000 | ORAL_TABLET | Freq: Two times a day (BID) | ORAL | Status: DC
  Administered 2015-10-07 – 2015-10-12 (×5): 1 via ORAL
  Filled 2015-10-06 (×8): qty 1

## 2015-10-06 MED ORDER — SODIUM CHLORIDE 0.9 % IV SOLN
INTRAVENOUS | Status: DC
Start: 2015-10-07 — End: 2015-10-08
  Administered 2015-10-07 (×2): via INTRAVENOUS
  Administered 2015-10-08: 75 mL via INTRAVENOUS

## 2015-10-06 MED ORDER — STROKE: EARLY STAGES OF RECOVERY BOOK
Freq: Once | Status: AC
  Administered 2015-10-07: 01:00:00
  Filled 2015-10-06: qty 1

## 2015-10-06 MED ORDER — NITROGLYCERIN 0.4 MG SL SUBL
0.4000 mg | SUBLINGUAL_TABLET | SUBLINGUAL | Status: DC | PRN
  Administered 2015-10-06 (×3): 0.4 mg via SUBLINGUAL
  Filled 2015-10-06: qty 1

## 2015-10-06 MED ORDER — ACETAMINOPHEN 325 MG PO TABS
650.0000 mg | ORAL_TABLET | ORAL | Status: DC | PRN
  Administered 2015-10-07 – 2015-10-10 (×2): 650 mg via ORAL
  Filled 2015-10-06 (×2): qty 2

## 2015-10-06 NOTE — ED Notes (Signed)
Pt CBG, 109.

## 2015-10-06 NOTE — ED Notes (Signed)
Took pt off bedpan. Pt unable to void at this time.

## 2015-10-06 NOTE — ED Notes (Signed)
Per Dr. Liston Alba pads placed on pt.
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
## 2015-10-06 NOTE — ED Notes (Signed)
Placed pt on bed pan.

## 2015-10-06 NOTE — Consult Note (Signed)
Referring Physician: Dr. Laneta Simmers Primary Physician: Primary Cardiologist: Reason for Consultation: "Elevated trop"   HPI: 55 y/o african american woman with pmh of morbid obesity, HTN brought in with loss of consciousness and seizures. Her workup in ED showed new hemorrhagic stroke in right posterior parietal lobe consistent with intracerebral hemorrhage without any gross motor deficits. Her labs in ER showed mildly elevated troponins so cardiology was consulted. She denies any chest pain currently. She gets short of breath on mild to moderate activity. No prior CAD or MI in past. She has not seen a doctor for some time. She takes prn asa and nsaids for pain. She was given asa.  EKG shows SR with LVH and non specific ST changes. She had a nuclear stress test done in 2012 that was read as low risk with likely artifact.   Review of Systems:     Cardiac Review of Systems: {Y] = yes [ ]  = no  Chest Pain [    ]  Resting SOB [   ] Exertional SOB  [  ]  Orthopnea [  ]   Pedal Edema [   ]    Palpitations [  ] Syncope  [  ]   Presyncope [   ]  General Review of Systems: [Y] = yes [  ]=no Constitional: recent weight change [  ]; anorexia [  ]; fatigue [  ]; nausea [  ]; night sweats [  ]; fever [  ]; or chills [  ];                                                                     Eyes : blurred vision [  ]; diplopia [   ]; vision changes [  ];  Amaurosis fugax[  ]; Resp: cough [  ];  wheezing[  ];  hemoptysis[  ];  PND [  ];  GI:  gallstones[  ], vomiting[  ];  dysphagia[  ]; melena[  ];  hematochezia [  ]; heartburn[  ];   GU: kidney stones [  ]; hematuria[  ];   dysuria [  ];  nocturia[  ]; incontinence [  ];             Skin: rash, swelling[  ];, hair loss[  ];  peripheral edema[  ];  or itching[  ]; Musculosketetal: myalgias[  ];  joint swelling[  ];  joint erythema[  ];  joint pain[  ];  back pain[  ];  Heme/Lymph: bruising[  ];  bleeding[  ];  anemia[  ];  Neuro: TIA[  ];  headaches[  ];   stroke[  ];  vertigo[  ];  seizures[  ];   paresthesias[  ];  difficulty walking[  ];  Psych:depression[  ]; anxiety[  ];  Endocrine: diabetes[  ];  thyroid dysfunction[  ];  Other:  Past Medical History  Diagnosis Date  . Hypertension      (Not in a hospital admission)   . aspirin  324 mg Oral Once  . [START ON 10/07/2015] nitroGLYCERIN  0.5 inch Topical Q6H    Infusions:    No Known Allergies  Social History   Social History  . Marital Status: Married  Spouse Name: N/A  . Number of Children: N/A  . Years of Education: N/A   Occupational History  . Not on file.   Social History Main Topics  . Smoking status: Never Smoker   . Smokeless tobacco: Not on file  . Alcohol Use: No  . Drug Use: No  . Sexual Activity: Not on file   Other Topics Concern  . Not on file   Social History Narrative    No family history on file.  PHYSICAL EXAM: Filed Vitals:   10/06/15 2230 10/06/15 2245  BP: 151/87 136/78  Pulse: 96 90  Temp:    Resp: 22 16    No intake or output data in the 24 hours ending 10/06/15 2250  General:  Well appearing. No respiratory difficulty, obese  HEENT: normal Neck: supple. no JVD. Carotids 2+ bilat; no bruits. No lymphadenopathy or thryomegaly appreciated. Cor: PMI nondisplaced. Regular rate & rhythm. No rubs, gallops or murmurs. Lungs: clear Abdomen: soft, nontender, nondistended. No hepatosplenomegaly. No bruits or masses. Good bowel sounds. Extremities: no cyanosis, clubbing, rash, edema Neuro: alert & oriented x 3, cranial nerves grossly intact. moves all 4 extremities w/o difficulty. Affect pleasant.  ECG:  Results for orders placed or performed during the hospital encounter of 10/06/15 (from the past 24 hour(s))  POC CBG, ED     Status: Abnormal   Collection Time: 10/06/15  6:02 PM  Result Value Ref Range   Glucose-Capillary 109 (H) 65 - 99 mg/dL  CBC with Differential     Status: Abnormal   Collection Time: 10/06/15  6:12 PM    Result Value Ref Range   WBC 12.7 (H) 4.0 - 10.5 K/uL   RBC 5.12 (H) 3.87 - 5.11 MIL/uL   Hemoglobin 10.9 (L) 12.0 - 15.0 g/dL   HCT 37.2 36.0 - 46.0 %   MCV 72.7 (L) 78.0 - 100.0 fL   MCH 21.3 (L) 26.0 - 34.0 pg   MCHC 29.3 (L) 30.0 - 36.0 g/dL   RDW 17.6 (H) 11.5 - 15.5 %   Platelets 262 150 - 400 K/uL   Neutrophils Relative % 83 %   Lymphocytes Relative 13 %   Monocytes Relative 4 %   Eosinophils Relative 0 %   Basophils Relative 0 %   Neutro Abs 10.5 (H) 1.7 - 7.7 K/uL   Lymphs Abs 1.7 0.7 - 4.0 K/uL   Monocytes Absolute 0.5 0.1 - 1.0 K/uL   Eosinophils Absolute 0.0 0.0 - 0.7 K/uL   Basophils Absolute 0.0 0.0 - 0.1 K/uL  Comprehensive metabolic panel     Status: Abnormal   Collection Time: 10/06/15  6:12 PM  Result Value Ref Range   Sodium 141 135 - 145 mmol/L   Potassium 3.0 (L) 3.5 - 5.1 mmol/L   Chloride 104 101 - 111 mmol/L   CO2 30 22 - 32 mmol/L   Glucose, Bld 104 (H) 65 - 99 mg/dL   BUN 14 6 - 20 mg/dL   Creatinine, Ser 0.89 0.44 - 1.00 mg/dL   Calcium 9.1 8.9 - 10.3 mg/dL   Total Protein 8.0 6.5 - 8.1 g/dL   Albumin 3.3 (L) 3.5 - 5.0 g/dL   AST 21 15 - 41 U/L   ALT 15 14 - 54 U/L   Alkaline Phosphatase 68 38 - 126 U/L   Total Bilirubin 0.3 0.3 - 1.2 mg/dL   GFR calc non Af Amer >60 >60 mL/min   GFR calc Af Amer >60 >60 mL/min  Anion gap 7 5 - 15  Brain natriuretic peptide     Status: None   Collection Time: 10/06/15  6:12 PM  Result Value Ref Range   B Natriuretic Peptide 92.7 0.0 - 100.0 pg/mL  I-Stat Troponin, ED - 0, 3, 6 hours (not at [facility])     Status: Abnormal   Collection Time: 10/06/15  6:28 PM  Result Value Ref Range   Troponin i, poc 0.09 (HH) 0.00 - 0.08 ng/mL   Comment NOTIFIED PHYSICIAN    Comment 3          I-Stat Arterial Blood Gas, ED - (order at [facility] and [facility] only)     Status: Abnormal   Collection Time: 10/06/15  6:32 PM  Result Value Ref Range   pH, Arterial 7.371 7.350 - 7.450   pCO2 arterial 53.2 (H) 35.0 - 45.0 mmHg   pO2,  Arterial 58.0 (L) 80.0 - 100.0 mmHg   Bicarbonate 30.9 (H) 20.0 - 24.0 mEq/L   TCO2 32 0 - 100 mmol/L   O2 Saturation 89.0 %   Acid-Base Excess 4.0 (H) 0.0 - 2.0 mmol/L   Patient temperature 98.6 F    Collection site RADIAL, ALLEN'S TEST ACCEPTABLE    Drawn by RT    Sample type ARTERIAL   I-Stat Troponin, ED - 0, 3, 6 hours (not at [facility])     Status: Abnormal   Collection Time: 10/06/15  9:20 PM  Result Value Ref Range   Troponin i, poc 0.14 (HH) 0.00 - 0.08 ng/mL   Comment NOTIFIED PHYSICIAN    Comment 3           Ct Head Wo Contrast  10/06/2015  CLINICAL DATA:  Syncopal episodes several hours ago. EXAM: CT HEAD WITHOUT CONTRAST TECHNIQUE: Contiguous axial images were obtained from the base of the skull through the vertex without intravenous contrast. COMPARISON:  None. FINDINGS: Brain: Hyperdense mass within the posterior temporoparietal lobe measures 1.8 x 1.4 cm with surrounding low attenuation, image 12 of series 2. There is mild patchy low attenuation within the periventricular white matter compatible with chronic microvascular disease. Vascular: No hyperdense vessel or unexpected calcification. Skull: Negative for fracture or focal lesion. Sinuses/Orbits: No acute findings. Other: None. IMPRESSION: 1. Examination is positive for a hyperdense mass within the right posterior temporoparietal lobe. In the acute setting finding is concerning for a focal hemorrhagic infarct. 2. Mild chronic small vessel ischemic change. Critical Value/emergent results were called by telephone at the time of interpretation on 10/06/2015 at 7:04 pm to Dr. Laneta Simmers, who verbally acknowledged these results. Electronically Signed   By: Kerby Moors M.D.   On: 10/06/2015 19:05   Ct Angio Chest Pe W/cm &/or Wo Cm  10/06/2015  CLINICAL DATA:  55 year old female with shortness of breath and headache EXAM: CT ANGIOGRAPHY CHEST WITH CONTRAST TECHNIQUE: Multidetector CT imaging of the chest was performed using the standard  protocol during bolus administration of intravenous contrast. Multiplanar CT image reconstructions and MIPs were obtained to evaluate the vascular anatomy. CONTRAST:  100 cc Isovue 370 COMPARISON:  Chest radiograph dated 10/06/2015 and CT dated 05/21/2010 FINDINGS: Evaluation is limited due to streak artifact caused by patient's body habitus. There is mild diffuse ground-glass density throughout the lungs which may represent atelectatic changes or mild interstitial edema. There is no focal consolidation. No pleural effusion or pneumothorax. The central airways are patent. The thoracic aorta appears unremarkable. There is no aneurysmal dilatation or evidence of dissection. The origins of the great  vessels of the aortic arch appear patent. There is mild dilatation of the main pulmonary trunk indicative of a degree of pulmonary hypertension. There is no CT evidence of pulmonary embolism. There is moderate cardiomegaly with right ventricular hypertrophy. No pericardial effusion. There is no hilar or mediastinal adenopathy. The esophagus is grossly unremarkable. No thyroid nodules identified. There is no axillary or supraclavicular adenopathy. The chest wall soft tissues appear unremarkable. There is degenerative changes of the spine. No acute fracture. The visualized upper abdomen appears unremarkable. Review of the MIP images confirms the above findings. IMPRESSION: No CT evidence of pulmonary embolism. Cardiomegaly with mild edema or congestive changes. No focal consolidation. Electronically Signed   By: Anner Crete M.D.   On: 10/06/2015 19:23   Dg Chest Portable 1 View  10/06/2015  CLINICAL DATA:  New onset seizures. EXAM: PORTABLE CHEST 1 VIEW COMPARISON:  Radiographs of March 19, 2011. FINDINGS: Stable cardiomegaly. No pneumothorax or pleural effusion is noted. No acute pulmonary disease is noted. Bony thorax is unremarkable. IMPRESSION: No acute cardiopulmonary abnormality seen. Electronically Signed    By: Marijo Conception, M.D.   On: 10/06/2015 18:02     ASSESSMENT: Elevated troponin in setting of intracerebral hemorrhage and seizure likely type II MI  Due to her intra-cerebral hemorrhage she is not a candidate for antiplatelet or anticoagulant therapy at this time and will need neurology/neurosurgical evaluation before this therapy. She may have undiagnosed CAD but at this time conservative treatment with BP control and heart rate control is recommended    PLAN/DISCUSSION: 1. Conservative medical management with BP control and heart rate control. Can have nitrates and bb  2. Agree with Echo , cycle trop 3. Cardiology will folllow   Thankyou for the consult   [place]

## 2015-10-06 NOTE — H&P (Signed)
Admission H&P    Chief Complaint: Loss of consciousness with generalized seizure.   HPI: [name] is an 55 y.o. female with a history of hypertension brought to the emergency room following sudden onset of loss of consciousness and witnessed generalized seizure activity. Patient has no history of seizure disorder. There is also no history of syncope. She was confused on regaining consciousness. CT scan of her head showed acute right posterior parietal intracerebral hemorrhage. Patient has not experienced visual changes nor change in speech. She also has not experienced weakness and numbness involving the extremities. NIH stroke score was 0. She also complained of chest pain. CT scan of her chest showed no signs of an acute pulmonary embolus. Troponin was elevated at 0.09. Repeat troponin studies are pending. Cardiology was consulted.  LSN: 3:00 PM on 10/06/2015 tPA Given: No: Acute ICH mRankin:  Past Medical History  Diagnosis Date  . Hypertension     Past Surgical History  Procedure Laterality Date  . Cardiac catheterization      Family history: Reviewed and was noncontributory.  Social History:  reports that she has never smoked. She does not have any smokeless tobacco history on file. She reports that she does not drink alcohol or use illicit drugs.  Allergies: No Known Allergies  Medications: Preadmission medications were reviewed by me.  ROS: History obtained from the patient  General ROS: negative for - chills, fatigue, fever, night sweats, weight gain or weight loss Psychological ROS: negative for - behavioral disorder, hallucinations, memory difficulties, mood swings or suicidal ideation Ophthalmic ROS: negative for - blurry vision, double vision, eye pain or loss of vision ENT ROS: negative for - epistaxis, nasal discharge, oral lesions, sore throat, tinnitus or vertigo Allergy and Immunology ROS: negative for - hives or itchy/watery eyes Hematological and  Lymphatic ROS: negative for - bleeding problems, bruising or swollen lymph nodes Endocrine ROS: negative for - galactorrhea, hair pattern changes, polydipsia/polyuria or temperature intolerance Respiratory ROS: negative for - cough, hemoptysis, shortness of breath or wheezing Cardiovascular ROS: negative for - chest pain, dyspnea on exertion, edema or irregular heartbeat Gastrointestinal ROS: negative for - abdominal pain, diarrhea, hematemesis, nausea/vomiting or stool incontinence Genito-Urinary ROS: negative for - dysuria, hematuria, incontinence or urinary frequency/urgency Musculoskeletal ROS: negative for - joint swelling or muscular weakness Neurological ROS: as noted in HPI Dermatological ROS: negative for rash and skin lesion changes  Physical Examination: Blood pressure 128/72, pulse 92, temperature 98.3 F (36.8 C), temperature source Oral, resp. rate 20, height '5\' 3"'  (1.6 m), weight 113.399 kg (250 lb), last menstrual period 02/17/2011, SpO2 97 %.  HEENT-  Normocephalic, no lesions, without obvious abnormality.  Normal external eye and conjunctiva.  Normal TM's bilaterally.  Normal auditory canals and external ears. Normal external nose, mucus membranes and septum.  Normal pharynx. Neck supple with no masses, nodes, nodules or enlargement. Cardiovascular - regular rate and rhythm, S1, S2 normal, no murmur, click, rub or gallop Lungs - chest clear, no wheezing, rales, normal symmetric air entry Abdomen - soft, non-tender; bowel sounds normal; no masses,  no organomegaly Extremities - no joint deformities, effusion, or inflammation, mild edema involving distal legs and feet  Neurologic Examination: Mental Status: Alert, oriented, no acute distress.  Speech fluent without evidence of aphasia. Able to follow commands without difficulty. Cranial Nerves: II-Visual fields were normal. III/IV/VI-Pupils were equal and reacted normally to light. Extraocular movements were full and  conjugate.    V/VII-no facial numbness and no facial weakness. VIII-normal.  X-normal speech and symmetrical palatal movement. XI: trapezius strength/neck flexion strength normal bilaterally XII-midline tongue extension with normal strength. Motor: 5/5 bilaterally with normal tone and bulk Sensory: Normal throughout. Deep Tendon Reflexes: 1+ and symmetric. Plantars: Flexor bilaterally Cerebellar: Normal finger-to-nose testing. Carotid auscultation: Normal  Results for orders placed or performed during the hospital encounter of 10/06/15 (from the past 48 hour(s))  POC CBG, ED     Status: Abnormal   Collection Time: 10/06/15  6:02 PM  Result Value Ref Range   Glucose-Capillary 109 (H) 65 - 99 mg/dL  CBC with Differential     Status: Abnormal   Collection Time: 10/06/15  6:12 PM  Result Value Ref Range   WBC 12.7 (H) 4.0 - 10.5 K/uL   RBC 5.12 (H) 3.87 - 5.11 MIL/uL   Hemoglobin 10.9 (L) 12.0 - 15.0 g/dL   HCT 37.2 36.0 - 46.0 %   MCV 72.7 (L) 78.0 - 100.0 fL   MCH 21.3 (L) 26.0 - 34.0 pg   MCHC 29.3 (L) 30.0 - 36.0 g/dL   RDW 17.6 (H) 11.5 - 15.5 %   Platelets 262 150 - 400 K/uL   Neutrophils Relative % 83 %   Lymphocytes Relative 13 %   Monocytes Relative 4 %   Eosinophils Relative 0 %   Basophils Relative 0 %   Neutro Abs 10.5 (H) 1.7 - 7.7 K/uL   Lymphs Abs 1.7 0.7 - 4.0 K/uL   Monocytes Absolute 0.5 0.1 - 1.0 K/uL   Eosinophils Absolute 0.0 0.0 - 0.7 K/uL   Basophils Absolute 0.0 0.0 - 0.1 K/uL  Comprehensive metabolic panel     Status: Abnormal   Collection Time: 10/06/15  6:12 PM  Result Value Ref Range   Sodium 141 135 - 145 mmol/L   Potassium 3.0 (L) 3.5 - 5.1 mmol/L   Chloride 104 101 - 111 mmol/L   CO2 30 22 - 32 mmol/L   Glucose, Bld 104 (H) 65 - 99 mg/dL   BUN 14 6 - 20 mg/dL   Creatinine, Ser 0.89 0.44 - 1.00 mg/dL   Calcium 9.1 8.9 - 10.3 mg/dL   Total Protein 8.0 6.5 - 8.1 g/dL   Albumin 3.3 (L) 3.5 - 5.0 g/dL   AST 21 15 - 41 U/L   ALT 15 14 - 54 U/L    Alkaline Phosphatase 68 38 - 126 U/L   Total Bilirubin 0.3 0.3 - 1.2 mg/dL   GFR calc non Af Amer >60 >60 mL/min   GFR calc Af Amer >60 >60 mL/min    Comment: (NOTE) The eGFR has been calculated using the CKD EPI equation. This calculation has not been validated in all clinical situations. eGFR's persistently <60 mL/min signify possible Chronic Kidney Disease.    Anion gap 7 5 - 15  Brain natriuretic peptide     Status: None   Collection Time: 10/06/15  6:12 PM  Result Value Ref Range   B Natriuretic Peptide 92.7 0.0 - 100.0 pg/mL  I-Stat Troponin, ED - 0, 3, 6 hours (not at [facility])     Status: Abnormal   Collection Time: 10/06/15  6:28 PM  Result Value Ref Range   Troponin i, poc 0.09 (HH) 0.00 - 0.08 ng/mL   Comment NOTIFIED PHYSICIAN    Comment 3            Comment: Due to the release kinetics of cTnI, a negative result within the first hours of the onset of symptoms does not rule  out myocardial infarction with certainty. If myocardial infarction is still suspected, repeat the test at appropriate intervals.   I-Stat Arterial Blood Gas, ED - (order at [facility] and [facility] only)     Status: Abnormal   Collection Time: 10/06/15  6:32 PM  Result Value Ref Range   pH, Arterial 7.371 7.350 - 7.450   pCO2 arterial 53.2 (H) 35.0 - 45.0 mmHg   pO2, Arterial 58.0 (L) 80.0 - 100.0 mmHg   Bicarbonate 30.9 (H) 20.0 - 24.0 mEq/L   TCO2 32 0 - 100 mmol/L   O2 Saturation 89.0 %   Acid-Base Excess 4.0 (H) 0.0 - 2.0 mmol/L   Patient temperature 98.6 F    Collection site RADIAL, ALLEN'S TEST ACCEPTABLE    Drawn by RT    Sample type ARTERIAL   I-Stat Troponin, ED - 0, 3, 6 hours (not at [facility])     Status: Abnormal   Collection Time: 10/06/15  9:20 PM  Result Value Ref Range   Troponin i, poc 0.14 (HH) 0.00 - 0.08 ng/mL   Comment NOTIFIED PHYSICIAN    Comment 3            Comment: Due to the release kinetics of cTnI, a negative result within the first hours of the onset of symptoms does not rule  out myocardial infarction with certainty. If myocardial infarction is still suspected, repeat the test at appropriate intervals.    Ct Head Wo Contrast  10/06/2015  CLINICAL DATA:  Syncopal episodes several hours ago. EXAM: CT HEAD WITHOUT CONTRAST TECHNIQUE: Contiguous axial images were obtained from the base of the skull through the vertex without intravenous contrast. COMPARISON:  None. FINDINGS: Brain: Hyperdense mass within the posterior temporoparietal lobe measures 1.8 x 1.4 cm with surrounding low attenuation, image 12 of series 2. There is mild patchy low attenuation within the periventricular white matter compatible with chronic microvascular disease. Vascular: No hyperdense vessel or unexpected calcification. Skull: Negative for fracture or focal lesion. Sinuses/Orbits: No acute findings. Other: None. IMPRESSION: 1. Examination is positive for a hyperdense mass within the right posterior temporoparietal lobe. In the acute setting finding is concerning for a focal hemorrhagic infarct. 2. Mild chronic small vessel ischemic change. Critical Value/emergent results were called by telephone at the time of interpretation on 10/06/2015 at 7:04 pm to Dr. Laneta Simmers, who verbally acknowledged these results. Electronically Signed   By: Kerby Moors M.D.   On: 10/06/2015 19:05   Ct Angio Chest Pe W/cm &/or Wo Cm  10/06/2015  CLINICAL DATA:  55 year old female with shortness of breath and headache EXAM: CT ANGIOGRAPHY CHEST WITH CONTRAST TECHNIQUE: Multidetector CT imaging of the chest was performed using the standard protocol during bolus administration of intravenous contrast. Multiplanar CT image reconstructions and MIPs were obtained to evaluate the vascular anatomy. CONTRAST:  100 cc Isovue 370 COMPARISON:  Chest radiograph dated 10/06/2015 and CT dated 05/21/2010 FINDINGS: Evaluation is limited due to streak artifact caused by patient's body habitus. There is mild diffuse ground-glass density throughout  the lungs which may represent atelectatic changes or mild interstitial edema. There is no focal consolidation. No pleural effusion or pneumothorax. The central airways are patent. The thoracic aorta appears unremarkable. There is no aneurysmal dilatation or evidence of dissection. The origins of the great vessels of the aortic arch appear patent. There is mild dilatation of the main pulmonary trunk indicative of a degree of pulmonary hypertension. There is no CT evidence of pulmonary embolism. There is moderate cardiomegaly with right  ventricular hypertrophy. No pericardial effusion. There is no hilar or mediastinal adenopathy. The esophagus is grossly unremarkable. No thyroid nodules identified. There is no axillary or supraclavicular adenopathy. The chest wall soft tissues appear unremarkable. There is degenerative changes of the spine. No acute fracture. The visualized upper abdomen appears unremarkable. Review of the MIP images confirms the above findings. IMPRESSION: No CT evidence of pulmonary embolism. Cardiomegaly with mild edema or congestive changes. No focal consolidation. Electronically Signed   By: Anner Crete M.D.   On: 10/06/2015 19:23   Dg Chest Portable 1 View  10/06/2015  CLINICAL DATA:  New onset seizures. EXAM: PORTABLE CHEST 1 VIEW COMPARISON:  Radiographs of March 19, 2011. FINDINGS: Stable cardiomegaly. No pneumothorax or pleural effusion is noted. No acute pulmonary disease is noted. Bony thorax is unremarkable. IMPRESSION: No acute cardiopulmonary abnormality seen. Electronically Signed   By: Marijo Conception, M.D.   On: 10/06/2015 18:02    Assessment: 55 y.o. female with a history of hypertension presenting with acute right posterior parietal parenchymal cerebral hemorrhage, as well as a first-time generalized seizure.  Stroke Risk Factors - hypertension  Plan: 1. HgbA1c, fasting lipid panel 2. MRI, MRA  of the brain without contrast 3. PT consult, OT consult, Speech  consult 4. Echocardiogram 5. Carotid dopplers 6. Prophylactic therapy-None 7. Risk factor modification 8. Telemetry monitoring 9. EEG, routine adult study 10. No anti-epilepsy drug recommended at this point. Treatment will begin, however, if she has a recurrent seizure or EEG shows findings indicative of increased risk for future seizure activity.  C.R. Nicole Kindred, MD [facility] [phone]  10/06/2015, 9:52 PM

## 2015-10-06 NOTE — ED Notes (Signed)
This RN spoke with CT tech who asked if MD wanted CT with contrast of head or chest pending read results of CT head wo contrast.  Per Dr. Laneta Simmers CTA chest priority.

## 2015-10-06 NOTE — ED Notes (Signed)
Pt dropped to 88% on RA, placed on 4L, SpO2 came up to 98%.

## 2015-10-06 NOTE — ED Notes (Signed)
Patient presents to ed from work with complaints of having a seizure, she was standing at the sink and looked off into a daze, her coworker assisted her down to the ground and she then had a seizure lasting 1 minute and loss control of her bladder, the patient has no history of seizures, she is alert and oriented on arrival to the ed with complaints of left sided chest pain and a headache

## 2015-10-06 NOTE — ED Notes (Signed)
MD at bedside. 

## 2015-10-07 ENCOUNTER — Encounter (HOSPITAL_COMMUNITY): Payer: Self-pay | Admitting: *Deleted

## 2015-10-07 ENCOUNTER — Inpatient Hospital Stay (HOSPITAL_COMMUNITY): Payer: Medicaid Other

## 2015-10-07 DIAGNOSIS — I10 Essential (primary) hypertension: Secondary | ICD-10-CM | POA: Insufficient documentation

## 2015-10-07 DIAGNOSIS — I619 Nontraumatic intracerebral hemorrhage, unspecified: Secondary | ICD-10-CM

## 2015-10-07 DIAGNOSIS — R062 Wheezing: Secondary | ICD-10-CM | POA: Insufficient documentation

## 2015-10-07 LAB — URINALYSIS, ROUTINE W REFLEX MICROSCOPIC
BILIRUBIN URINE: NEGATIVE
GLUCOSE, UA: NEGATIVE mg/dL
KETONES UR: NEGATIVE mg/dL
LEUKOCYTES UA: NEGATIVE
NITRITE: NEGATIVE
Specific Gravity, Urine: 1.025 (ref 1.005–1.030)
pH: 5 (ref 5.0–8.0)

## 2015-10-07 LAB — URINE MICROSCOPIC-ADD ON

## 2015-10-07 LAB — COMPREHENSIVE METABOLIC PANEL
ALK PHOS: 59 U/L (ref 38–126)
ALT: 13 U/L — ABNORMAL LOW (ref 14–54)
ANION GAP: 7 (ref 5–15)
AST: 26 U/L (ref 15–41)
Albumin: 3.2 g/dL — ABNORMAL LOW (ref 3.5–5.0)
BUN: 13 mg/dL (ref 6–20)
CALCIUM: 8.8 mg/dL — AB (ref 8.9–10.3)
CHLORIDE: 106 mmol/L (ref 101–111)
CO2: 26 mmol/L (ref 22–32)
Creatinine, Ser: 0.91 mg/dL (ref 0.44–1.00)
GFR calc non Af Amer: >60 mL/min (ref >60)
Glucose, Bld: 100 mg/dL — ABNORMAL HIGH (ref 65–99)
Potassium: 3.7 mmol/L (ref 3.5–5.1)
SODIUM: 139 mmol/L (ref 135–145)
Total Bilirubin: 0.4 mg/dL (ref 0.3–1.2)
Total Protein: 7.8 g/dL (ref 6.5–8.1)

## 2015-10-07 LAB — CBC
HCT: 35.5 % — ABNORMAL LOW (ref 36.0–46.0)
HEMOGLOBIN: 10.3 g/dL — AB (ref 12.0–15.0)
MCH: 21.4 pg — AB (ref 26.0–34.0)
MCHC: 29 g/dL — ABNORMAL LOW (ref 30.0–36.0)
MCV: 73.8 fL — ABNORMAL LOW (ref 78.0–100.0)
PLATELETS: 264 10*3/uL (ref 150–400)
RBC: 4.81 MIL/uL (ref 3.87–5.11)
RDW: 17.9 % — ABNORMAL HIGH (ref 11.5–15.5)
WBC: 8.5 10*3/uL (ref 4.0–10.5)

## 2015-10-07 LAB — VAS US CAROTID
LCCAPSYS: 120 cm/s
LEFT VERTEBRAL DIAS: 20 cm/s
Left CCA dist dias: -26 cm/s
Left CCA dist sys: -107 cm/s
Left CCA prox dias: 22 cm/s
Left ICA dist dias: -28 cm/s
Left ICA dist sys: -70 cm/s
Left ICA prox dias: -25 cm/s
Left ICA prox sys: -61 cm/s
RCCADSYS: -81 cm/s
RIGHT ECA DIAS: 17 cm/s
Right CCA prox dias: 13 cm/s
Right CCA prox sys: 75 cm/s

## 2015-10-07 LAB — CK TOTAL AND CKMB (NOT AT ARMC)
CK TOTAL: 666 U/L — AB (ref 38–234)
CK, MB: 5.3 ng/mL — ABNORMAL HIGH (ref 0.5–5.0)
CK, MB: 4.4 ng/mL (ref 0.5–5.0)
CK, MB: 3.4 ng/mL (ref 0.5–5.0)
RELATIVE INDEX: 0.7 (ref 0.0–2.5)
Relative Index: 0.8 (ref 0.0–2.5)
Relative Index: 0.8 (ref 0.0–2.5)
Total CK: 548 U/L — ABNORMAL HIGH (ref 38–234)
Total CK: 507 U/L — ABNORMAL HIGH (ref 38–234)

## 2015-10-07 LAB — TROPONIN I
TROPONIN I: 0.17 ng/mL — AB (ref <0.03)
TROPONIN I: 0.08 ng/mL — AB (ref <0.03)
Troponin I: 0.1 ng/mL (ref <0.03)

## 2015-10-07 LAB — GLUCOSE, CAPILLARY
GLUCOSE-CAPILLARY: 110 mg/dL — AB (ref 65–99)
Glucose-Capillary: 128 mg/dL — ABNORMAL HIGH (ref 65–99)
Glucose-Capillary: 110 mg/dL — ABNORMAL HIGH (ref 65–99)

## 2015-10-07 LAB — MRSA PCR SCREENING: MRSA by PCR: NEGATIVE

## 2015-10-07 MED ORDER — ONDANSETRON HCL 4 MG/2ML IJ SOLN
4.0000 mg | Freq: Once | INTRAMUSCULAR | Status: AC
  Administered 2015-10-07: 4 mg via INTRAVENOUS

## 2015-10-07 MED ORDER — METOPROLOL TARTRATE 25 MG PO TABS
25.0000 mg | ORAL_TABLET | Freq: Two times a day (BID) | ORAL | Status: DC
  Administered 2015-10-07 – 2015-10-11 (×9): 25 mg via ORAL
  Filled 2015-10-07 (×10): qty 1

## 2015-10-07 MED ORDER — HYDROCODONE-ACETAMINOPHEN 5-325 MG PO TABS
1.0000 | ORAL_TABLET | Freq: Four times a day (QID) | ORAL | Status: DC | PRN
  Administered 2015-10-07 (×2): 2 via ORAL
  Filled 2015-10-07 (×2): qty 2

## 2015-10-07 MED ORDER — POTASSIUM CHLORIDE CRYS ER 20 MEQ PO TBCR
40.0000 meq | EXTENDED_RELEASE_TABLET | Freq: Once | ORAL | Status: AC
  Administered 2015-10-07: 40 meq via ORAL
  Filled 2015-10-07: qty 2

## 2015-10-07 MED ORDER — CETYLPYRIDINIUM CHLORIDE 0.05 % MT LIQD
7.0000 mL | Freq: Two times a day (BID) | OROMUCOSAL | Status: DC
  Administered 2015-10-07 – 2015-10-11 (×6): 7 mL via OROMUCOSAL

## 2015-10-07 MED ORDER — ONDANSETRON HCL 4 MG/2ML IJ SOLN
4.0000 mg | Freq: Four times a day (QID) | INTRAMUSCULAR | Status: DC | PRN
  Administered 2015-10-07 – 2015-10-08 (×3): 4 mg via INTRAVENOUS
  Filled 2015-10-07 (×4): qty 2

## 2015-10-07 MED ORDER — HYDROMORPHONE HCL 1 MG/ML IJ SOLN: 1.0000 mg | Freq: Four times a day (QID) | INTRAMUSCULAR | Status: DC | PRN

## 2015-10-07 MED ORDER — INSULIN ASPART 100 UNIT/ML ~~LOC~~ SOLN
0.0000 [IU] | Freq: Three times a day (TID) | SUBCUTANEOUS | Status: DC
  Administered 2015-10-07 – 2015-10-10 (×2): 1 [IU] via SUBCUTANEOUS

## 2015-10-07 MED ORDER — HYPROMELLOSE (GONIOSCOPIC) 2.5 % OP SOLN
2.0000 [drp] | OPHTHALMIC | Status: DC | PRN
  Administered 2015-10-07: 2 [drp] via OPHTHALMIC
  Filled 2015-10-07: qty 15

## 2015-10-07 MED ORDER — WHITE PETROLATUM GEL
Status: AC
  Filled 2015-10-07: qty 1

## 2015-10-07 MED ORDER — FLUTICASONE PROPIONATE 50 MCG/ACT NA SUSP
1.0000 | Freq: Every day | NASAL | Status: DC
  Administered 2015-10-07 – 2015-10-12 (×6): 1 via NASAL
  Filled 2015-10-07: qty 16

## 2015-10-07 MED ORDER — POTASSIUM CHLORIDE 10 MEQ/100ML IV SOLN: 10.0000 meq | INTRAVENOUS | Status: DC

## 2015-10-07 NOTE — Progress Notes (Signed)
Preliminary results by tech - Carotid Duplex Completed. No evidence of a significant stenosis noted in bilateral carotid arteries. Right vertebral artery not visualized. Left vertebral artery demonstrated antegrade flow.  Oda Cogan, BS, RDMS, RVT

## 2015-10-07 NOTE — Progress Notes (Addendum)
Pt had 13 beat run [place]. Neurology MD notified. Last K 3.0. Orders received to replace potassium. Will continue to monitor.

## 2015-10-07 NOTE — Progress Notes (Addendum)
STROKE TEAM PROGRESS NOTE   HISTORY OF PRESENT ILLNESS (per record) [name] is an 55 y.o. female with a history of hypertension brought to the emergency room following sudden onset of loss of consciousness and witnessed generalized seizure activity. Patient has no history of seizure disorder. There is also no history of syncope. She was confused on regaining consciousness. CT scan of her head showed acute right posterior parietal intracerebral hemorrhage. Patient has not experienced visual changes nor change in speech. She also has not experienced weakness and numbness involving the extremities. NIH stroke score was 0. She also complained of chest pain. CT scan of her chest showed no signs of an acute pulmonary embolus. Troponin was elevated at 0.09. Repeat troponin studies are pending. Cardiology was consulted.  LSN: 3:00 PM on 10/06/2015 tPA Given: No: Acute ICH mRankin:   SUBJECTIVE (INTERVAL HISTORY) Her daughter and cousin are at the bedside.  Overall she feels her condition is gradually worsening. She states she has a 7/10 all over headache.  She felt nauseated earlier in the AM but not now.  She also denies continued chest pain.  Remainder of ROS was completely negative.  Now off of Cleviprex   OBJECTIVE Temp:  [98.3 F (36.8 C)-98.8 F (37.1 C)] 98.8 F (37.1 C) (07/15 0800) Pulse Rate:  [90-108] 93 (07/15 1000) Cardiac Rhythm:  [-] Normal sinus rhythm (07/15 0800) Resp:  [8-36] 24 (07/15 1000) BP: (128-172)/(71-99) 159/94 mmHg (07/15 1000) SpO2:  [91 %-100 %] 98 % (07/15 1000) Weight:  [113.399 kg (250 lb)-120.8 kg (266 lb 5.1 oz)] 120.8 kg (266 lb 5.1 oz) (07/14 2345)  CBC:   Recent Labs Lab 10/06/15 1812 10/07/15 0814  WBC 12.7* 8.5  NEUTROABS 10.5*  --   HGB 10.9* 10.3*  HCT 37.2 35.5*  MCV 72.7* 73.8*  PLT 262 [id number]    Basic Metabolic Panel:   Recent Labs Lab 10/06/15 1812 10/07/15 0814  NA 141 139  K 3.0* 3.7  CL 104 106  CO2 30 26  GLUCOSE 104*  100*  BUN 14 13  CREATININE 0.89 0.91  CALCIUM 9.1 8.8*    Lipid Panel:     Component Value Date/Time   CHOL  05/19/2010 0520    93        ATP III CLASSIFICATION:  <200     mg/dL   Desirable  200-239  mg/dL   Borderline High  >=240    mg/dL   High          TRIG 95 05/19/2010 0520   HDL 37* 05/19/2010 0520   CHOLHDL 2.5 05/19/2010 0520   VLDL 19 05/19/2010 0520   LDLCALC  05/19/2010 0520    37        Total Cholesterol/HDL:CHD Risk Coronary Heart Disease Risk Table                     Men   Women  1/2 Average Risk   3.4   3.3  Average Risk       5.0   4.4  2 X Average Risk   9.6   7.1  3 X Average Risk  23.4   11.0        Use the calculated Patient Ratio above and the CHD Risk Table to determine the patient's CHD Risk.        ATP III CLASSIFICATION (LDL):  <100     mg/dL   Optimal  100-129  mg/dL   Near or  Above                    Optimal  130-159  mg/dL   Borderline  160-189  mg/dL   High  >190     mg/dL   Very High   HgbA1c: No results found for: HGBA1C Urine Drug Screen: No results found for: LABOPIA, COCAINSCRNUR, LABBENZ, AMPHETMU, THCU, LABBARB    IMAGING  Ct Head Wo Contrast 10/07/2015   1. Similar size of parenchymal hemorrhage centered at the posterior right temporal lobe, estimated volume 2.2 cc. Minimal localized edema without significant mass effect.  2. Moderate chronic microvascular ischemic disease.   Ct Head Wo Contrast 10/06/2015   1. Examination is positive for a hyperdense mass within the right posterior temporoparietal lobe. In the acute setting finding is concerning for a focal hemorrhagic infarct.  2. Mild chronic small vessel ischemic change.  Ct Angio Chest Pe W/cm &/or Wo Cm 10/06/2015   No CT evidence of pulmonary embolism. Cardiomegaly with mild edema or congestive changes. No focal consolidation.    Dg Chest Portable 1 View 10/06/2015   No acute cardiopulmonary abnormality seen.   PHYSICAL EXAM  Blood pressure 128/72, pulse  92, temperature 98.3 F (36.8 C), temperature source Oral, resp. rate 20, height 5\' 3"  (1.6 m), weight 113.399 kg (250 lb), last menstrual period 02/17/2011, SpO2 97 %.  HEENT- Normocephalic, no lesions, without obvious abnormality. Sclera extremely injected.  Normal external nose, mucus membranes and septum. Neck supple.   Cardiovascular - regular rate and rhythm, S1, S2 normal, no murmur, click, rub or gallop  Lungs - expiratory wheezing Right Upper Lung Field  Abdomen - soft, non-tender; bowel sounds normal; no masses, no organomegaly  Extremities - no joint deformities, effusion, or inflammation, mild edema involving distal legs and feet  Neurologic Examination: Mental Status: Alert, oriented, no acute distress. Speech fluent without evidence of aphasia. Able to follow commands without difficulty. Cranial Nerves: II-Visual fields were normal. III/IV/VI-Pupils were equal and reacted normally to light. Extraocular movements were full and conjugate.  V/VII-no facial numbness and no facial weakness. VIII-normal. X-normal speech and symmetrical palatal movement.  Sensory: Normal throughout.  Cerebellar and Gait testing deferred due to headache  ASSESSMENT/PLAN [name] is a 55 y.o. female with history of hypertension presenting with right temporal ICH and seizure. She did not receive IV t-PA due to [place].   Stroke:  ICH  Non-Dominant right likely secondary to hypertension  MRI  pending  MRA  pending  Carotid Doppler  pending  2D Echo  pending  LDL pending  HgbA1c pending  SCDs for VTE prophylaxis Diet Carb Modified Fluid consistency:: Thin; Room service appropriate?: Yes  No antithrombotic prior to admission, now on No antithrombotic  Patient counseled to be compliant with her antithrombotic medications:  N/A  Ongoing aggressive stroke risk factor management  Therapy recommendations:  pending  Disposition:   pending  Hypertension  Unstable  ICH:  Goal SBP< 160  Long-term BP goal normotensive  Hyperlipidemia  Home meds: N/A  LDL pending, goal < 7  Diabetes  HgbA1c pending, goal < 7.0  Controlled status pending  Other Stroke Risk Factors  No Advanced age  Cigarette smoker denies  ETOH use denied  Obesity, Body mass index is 53.76 kg/(m^2)., recommend weight loss, diet and exercise as appropriate   No Hx stroke/TIA  Positive Family hx stroke   No Coronary artery disease  No Migraines  No Diagnosis of obstructive sleep apnea;  However, patient positive for significant snoring  Other Active Problems  Hypokalemia; Repleted and will follow  Microcytic anemia:  Iron and TIBC pending  Leukocytosis:  Improved.  Will follow  Serial cardiac enzymes with elevation.  Cards following and believe numbers may not be related to ischemia. I ordered b-blocker for BP and rate control  Hospital day # 1  CRITICAL CARE NEUROLOGY ATTENDING NOTE Patient was seen and examined by me personally. I independently viewed imaging studies, participated in medical decision making and plan of care. The laboratory and radiographic studies were personally reviewed by me.  ROS completed by me personally and pertinent positives fully documented. Assessment and plan completed by me personally and  documented above.  Condition is unchanged   This patient is critically ill and at significant risk of neurological worsening, death and care requires constant monitoring of vital signs, hemodynamics,respiratory and cardiac monitoring, extensive review of multiple databases, frequent neurological assessment, discussion with family, other specialists and medical decision making of high complexity.  This critical care time does not reflect procedure time, or teaching time or supervisory time of PA/NP/Med Resident etc. but could involve care discussion time.  I spent 45 minutes of Neurocritical Care time in the  care of  this patient.  SIGNED BY: Dr. Elissa Hefty       To contact Stroke Continuity provider, please refer to [email or web address] After hours, contact General Neurology

## 2015-10-07 NOTE — Progress Notes (Signed)
Subjective:  Admitted with seizures and a large intracerebral hemorrhage last night.  Troponin is elevated.  No prior cardiac history and no history of much medical care previously.  Looks like she may have shortness of breath.  Troponins are mildly elevated.  Objective:  Vital Signs in the last 24 hours: BP 167/96 mmHg  Pulse 106  Temp(Src) 98.8 F (37.1 C) (Axillary)  Resp 15  Ht 4\' 11"  (1.499 m)  Wt 120.8 kg (266 lb 5.1 oz)  BMI 53.76 kg/m2  SpO2 94%  LMP 02/17/2011  Physical Exam: Morbidly obese black female in no acute distress Lungs:  Clear Cardiac:  Regular rhythm, normal S1 and S2, no S3 Abdomen:  Soft, nontender, no masses Extremities:  No edema present  Intake/Output from previous day: 07/14 0701 - 07/15 0700 In: 496.3 [I.V.:496.3] Out: 800 [Urine:800]  Weight Filed Weights   10/06/15 1706 10/06/15 2345  Weight: 113.399 kg (250 lb) 120.8 kg (266 lb 5.1 oz)    Lab Results: Basic Metabolic Panel:  Recent Labs  10/06/15 1812 10/07/15 0814  NA 141 139  K 3.0* 3.7  CL 104 106  CO2 30 26  GLUCOSE 104* 100*  BUN 14 13  CREATININE 0.89 0.91   CBC:  Recent Labs  10/06/15 1812 10/07/15 0814  WBC 12.7* 8.5  NEUTROABS 10.5*  --   HGB 10.9* 10.3*  HCT 37.2 35.5*  MCV 72.7* 73.8*  PLT 262 264   Cardiac Enzymes: Troponin (Point of Care Test)  Recent Labs  10/06/15 2120  TROPIPOC 0.14*   Cardiac Panel (last 3 results)  Recent Labs  10/07/15 0814  CKTOTAL 666*  CKMB 5.3*  TROPONINI 0.17*  RELINDX 0.8    Telemetry: Normal sinus rhythm  Assessment/Plan:  1.  Elevation of troponin that is likely due to intracerebral hemorrhage-I doubt that this is ischemia 2.  Probable undiagnosed hypertension previously and untreated 3.  Morbid obesity 4.  Elevation of CPK and MB likely due to seizure activity and possible early rhabdo.  Recommendations:  Clearly not a candidate for anticoagulation or any additional cardiovascular workup.  No  ischemic changes on EKG.  I would get an echocardiogram to assess LV wall thickness.     Kerry Hough  MD [facility]  10/07/2015, 9:56 AM

## 2015-10-07 NOTE — Progress Notes (Signed)
OT Cancellation Note  Patient Details Name: [name] MRN: [id number] DOB: 1960-03-30   Cancelled Treatment:    Reason Eval/Treat Not Completed: Patient not medically ready (active bedrest orders)  Binnie Kand M.S., OTR/L Pager: [phone]  10/07/2015, 7:30 AM

## 2015-10-07 NOTE — Progress Notes (Signed)
CRITICAL VALUE ALERT  Critical value received:  Troponin 0.17   Date of notification:  10/07/15  Time of notification:  0940  Critical value read back:Yes.    Nurse who received alert: J.Karie Georges, RN  MD notified (1st page):  Dr. Belenda Cruise   Time of first page:  [phone]  MD notified (2nd page):  Time of second page:  Responding MD:  Dr. Belenda Cruise   Time MD responded:  [phone]

## 2015-10-07 NOTE — Progress Notes (Signed)
PT Cancellation Note  Patient Details Name: [name] MRN: [id number] DOB: 08/10/1960   Cancelled Treatment:    Reason Eval/Treat Not Completed: Patient not medically ready (bedrest)   [name] 10/07/2015, 7:27 AM [name], PT DPT  [phone]

## 2015-10-08 ENCOUNTER — Inpatient Hospital Stay (HOSPITAL_COMMUNITY): Payer: Medicaid Other

## 2015-10-08 DIAGNOSIS — R5383 Other fatigue: Secondary | ICD-10-CM | POA: Insufficient documentation

## 2015-10-08 LAB — COMPREHENSIVE METABOLIC PANEL
ALK PHOS: 57 U/L (ref 38–126)
ALT: 20 U/L (ref 14–54)
ANION GAP: 5 (ref 5–15)
AST: 28 U/L (ref 15–41)
Albumin: 3.1 g/dL — ABNORMAL LOW (ref 3.5–5.0)
BUN: 15 mg/dL (ref 6–20)
CALCIUM: 9 mg/dL (ref 8.9–10.3)
CHLORIDE: 105 mmol/L (ref 101–111)
CO2: 30 mmol/L (ref 22–32)
Creatinine, Ser: 1.1 mg/dL — ABNORMAL HIGH (ref 0.44–1.00)
GFR calc non Af Amer: 56 mL/min — ABNORMAL LOW (ref >60)
Glucose, Bld: 113 mg/dL — ABNORMAL HIGH (ref 65–99)
Potassium: 4.1 mmol/L (ref 3.5–5.1)
SODIUM: 140 mmol/L (ref 135–145)
Total Bilirubin: 0.5 mg/dL (ref 0.3–1.2)
Total Protein: 7.8 g/dL (ref 6.5–8.1)

## 2015-10-08 LAB — IRON AND TIBC
Iron: 23 ug/dL — ABNORMAL LOW (ref 28–170)
SATURATION RATIOS: 7 % — AB (ref 10.4–31.8)
TIBC: 312 ug/dL (ref 250–450)
UIBC: 289 ug/dL

## 2015-10-08 LAB — BRAIN NATRIURETIC PEPTIDE: B Natriuretic Peptide: 160 pg/mL — ABNORMAL HIGH (ref 0.0–100.0)

## 2015-10-08 LAB — GLUCOSE, CAPILLARY
GLUCOSE-CAPILLARY: 109 mg/dL — AB (ref 65–99)
Glucose-Capillary: 89 mg/dL (ref 65–99)
Glucose-Capillary: 88 mg/dL (ref 65–99)
Glucose-Capillary: 94 mg/dL (ref 65–99)

## 2015-10-08 LAB — LIPID PANEL
CHOL/HDL RATIO: 2.7 ratio
Cholesterol: 112 mg/dL (ref 0–200)
HDL: 42 mg/dL (ref >40)
LDL CALC: 58 mg/dL (ref 0–99)
TRIGLYCERIDES: 62 mg/dL (ref <150)
VLDL: 12 mg/dL (ref 0–40)

## 2015-10-08 LAB — CBC
HCT: 33.1 % — ABNORMAL LOW (ref 36.0–46.0)
Hemoglobin: 9.2 g/dL — ABNORMAL LOW (ref 12.0–15.0)
MCH: 21.1 pg — AB (ref 26.0–34.0)
MCHC: 27.8 g/dL — AB (ref 30.0–36.0)
MCV: 75.9 fL — ABNORMAL LOW (ref 78.0–100.0)
PLATELETS: 263 10*3/uL (ref 150–400)
RBC: 4.36 MIL/uL (ref 3.87–5.11)
RDW: 18.1 % — AB (ref 11.5–15.5)
WBC: 9.9 10*3/uL (ref 4.0–10.5)

## 2015-10-08 LAB — URINE CULTURE

## 2015-10-08 MED ORDER — SALINE SPRAY 0.65 % NA SOLN
1.0000 | NASAL | Status: DC | PRN
  Filled 2015-10-08: qty 44

## 2015-10-08 MED ORDER — BISACODYL 10 MG RE SUPP: 10.0000 mg | Freq: Every day | RECTAL | Status: DC | PRN

## 2015-10-08 MED ORDER — SODIUM CHLORIDE 0.9 % IV SOLN
1000.0000 mg | Freq: Two times a day (BID) | INTRAVENOUS | Status: AC
  Administered 2015-10-08: 1000 mg via INTRAVENOUS
  Filled 2015-10-08: qty 10

## 2015-10-08 MED ORDER — LEVETIRACETAM 750 MG PO TABS
750.0000 mg | ORAL_TABLET | Freq: Two times a day (BID) | ORAL | Status: DC
  Administered 2015-10-08 – 2015-10-12 (×7): 750 mg via ORAL
  Filled 2015-10-08 (×7): qty 1

## 2015-10-08 MED ORDER — LABETALOL HCL 5 MG/ML IV SOLN
10.0000 mg | INTRAVENOUS | Status: DC | PRN
  Administered 2015-10-08: 10 mg via INTRAVENOUS
  Administered 2015-10-09 (×2): 20 mg via INTRAVENOUS
  Filled 2015-10-08 (×2): qty 4

## 2015-10-08 MED ORDER — PROMETHAZINE HCL 25 MG/ML IJ SOLN
12.5000 mg | Freq: Once | INTRAMUSCULAR | Status: AC
  Administered 2015-10-08: 12.5 mg via INTRAVENOUS
  Filled 2015-10-08: qty 1

## 2015-10-08 NOTE — Progress Notes (Signed)
Rehab Admissions Coordinator Note:  Patient was screened by Cleatrice Burke for appropriateness for an Inpatient Acute Rehab Consult per PT recommendation.   At this time, we are recommending Inpatient Rehab consult. Please place order.  Cleatrice Burke 10/08/2015, 4:40 PM  I can be reached at [phone].

## 2015-10-08 NOTE — Evaluation (Signed)
Physical Therapy Evaluation Patient Details Name: [name] MRN: [id number] DOB: 1960-12-18 Today's Date: 10/08/2015   History of Present Illness  pt is a 55 y/o female with h/o HTN admitted to ED after sudden loss of consciousness and witnessed generalized seizue activity.  CT scan showing a hemorrhagic infarct in the right posterior temporoparietal lobe.  Clinical Impression  Pt admitted with/for the above episode of unconsciousness and seizure activity.  Pt currently limited functionally due to the problems listed. ( See problems list.)   Pt will benefit from PT to maximize function and safety in order to get ready for next venue listed below.     Follow Up Recommendations CIR (may change once is alert and able to participate)    Equipment Recommendations  Other (comment) (TBA)    Recommendations for Other Services Rehab consult     Precautions / Restrictions Precautions Precautions: Fall      Mobility  Bed Mobility               General bed mobility comments: in recliner  Transfers Overall transfer level: Needs assistance Equipment used: 2 person hand held assist Transfers: Sit to/from Stand Sit to Stand: Min assist         General transfer comment: steady assist  Ambulation/Gait Ambulation/Gait assistance: Min assist;+2 physical assistance Ambulation Distance (Feet): 40 Feet Assistive device: 2 person hand held assist Gait Pattern/deviations: Step-through pattern Gait velocity: slow Gait velocity interpretation: Below normal speed for age/gender General Gait Details: Uncoordinated, waddling gait pattern.  Pt reporting when she looks left while ambulating she is becoming dizzy and nauseated.  Stairs            Wheelchair Mobility    Modified Rankin (Stroke Patients Only) Modified Rankin (Stroke Patients Only) Pre-Morbid Rankin Score: No symptoms Modified Rankin: Moderately severe disability     Balance Overall balance assessment: Needs  assistance Sitting-balance support: Feet supported;Single extremity supported;No upper extremity supported Sitting balance-Leahy Scale: Fair     Standing balance support: During functional activity;Bilateral upper extremity supported Standing balance-Leahy Scale: Poor Standing balance comment: general unsteadiness with slight tendency to list left                             Pertinent Vitals/Pain Pain Assessment: Faces Faces Pain Scale: Hurts little more Pain Location: headache Pain Descriptors / Indicators: Headache Pain Intervention(s): Monitored during session;Repositioned    Home Living Family/patient expects to be discharged to:: Private residence Living Arrangements: Spouse/significant other;Other relatives (brother and sister in law) Available Help at Discharge: Family;Available 24 hours/day Type of Home: House Home Access: Stairs to enter Entrance Stairs-Rails: Psychiatric nurse of Steps: several Home Layout: One level        Prior Function Level of Independence: Independent         Comments: working as Museum/gallery exhibitions officer        Extremity/Trunk Assessment   Upper Extremity Assessment: Defer to OT evaluation           Lower Extremity Assessment:  (bil mild proximal weakness, decrease coordination L )         Communication   Communication: No difficulties  Cognition Arousal/Alertness: Lethargic Behavior During Therapy: Flat affect Overall Cognitive Status: Difficult to assess                      General Comments General comments (skin integrity, edema, etc.): SpO2  on RA now steady in the upper 90's which earlier today was not the case.    Exercises        Assessment/Plan    PT Assessment Patient needs continued PT services  PT Diagnosis Difficulty walking;Generalized weakness   PT Problem List Decreased strength;Decreased activity tolerance;Decreased balance;Decreased  mobility;Decreased coordination  PT Treatment Interventions DME instruction;Gait training;Functional mobility training;Therapeutic activities;Balance training;Neuromuscular re-education;Patient/family education;Stair training   PT Goals (Current goals can be found in the Care Plan section) Acute Rehab PT Goals Patient Stated Goal: pt unable PT Goal Formulation: Patient unable to participate in goal setting Time For Goal Achievement: 10/22/15 Potential to Achieve Goals: Good    Frequency Min 3X/week   Barriers to discharge        Co-evaluation               End of Session   Activity Tolerance: Patient tolerated treatment well Patient left: in chair;with call bell/phone within reach;with family/visitor present Nurse Communication: Mobility status         Time: [id number] PT Time Calculation (min) (ACUTE ONLY): 27 min   Charges:   PT Evaluation $PT Eval Moderate Complexity: 1 Procedure PT Treatments $Therapeutic Activity: 8-22 mins   PT G Codes:        Ishana Blades, Tessie Fass 10/08/2015, 3:10 PM  10/08/2015  Donnella Sham, [place] [phone]  (pager)

## 2015-10-08 NOTE — Progress Notes (Addendum)
STROKE TEAM PROGRESS NOTE   HISTORY OF PRESENT ILLNESS (per record) [name] is an 55 y.o. female with a history of hypertension brought to the emergency room following sudden onset of loss of consciousness and witnessed generalized seizure activity. Patient has no history of seizure disorder. There is also no history of syncope. She was confused on regaining consciousness. CT scan of her head showed acute right posterior parietal intracerebral hemorrhage. Patient has not experienced visual changes nor change in speech. She also has not experienced weakness and numbness involving the extremities. NIH stroke score was 0. She also complained of chest pain. CT scan of her chest showed no signs of an acute pulmonary embolus. Troponin was elevated at 0.09. Repeat troponin studies are pending. Cardiology was consulted.  LSN: 3:00 PM on 10/06/2015 tPA Given: No: Acute ICH mRankin:   SUBJECTIVE (INTERVAL HISTORY) No Family at the bedside this morning.  Apparently patient received Phenergan around 5AM for nausea.  She has been lethargic for AM evaluations but will still respond correctly to questions.   OBJECTIVE Temp:  [97.2 F (36.2 C)-98.8 F (37.1 C)] 98.2 F (36.8 C) (07/16 0353) Pulse Rate:  [69-106] 88 (07/16 0700) Cardiac Rhythm:  [-] Normal sinus rhythm (07/15 2000) Resp:  [15-24] 18 (07/16 0700) BP: (125-167)/(78-104) 141/81 mmHg (07/16 0700) SpO2:  [74 %-100 %] 100 % (07/16 0700)  CBC:   Recent Labs Lab 10/06/15 1812 10/07/15 0814 10/08/15 0541  WBC 12.7* 8.5 9.9  NEUTROABS 10.5*  --   --   HGB 10.9* 10.3* 9.2*  HCT 37.2 35.5* 33.1*  MCV 72.7* 73.8* 75.9*  PLT 262 264 [id number]    Basic Metabolic Panel:   Recent Labs Lab 10/07/15 0814 10/08/15 0541  NA 139 140  K 3.7 4.1  CL 106 105  CO2 26 30  GLUCOSE 100* 113*  BUN 13 15  CREATININE 0.91 1.10*  CALCIUM 8.8* 9.0    Lipid Panel:     Component Value Date/Time   CHOL  05/19/2010 0520    93        ATP III  CLASSIFICATION:  <200     mg/dL   Desirable  200-239  mg/dL   Borderline High  >=240    mg/dL   High          TRIG 95 05/19/2010 0520   HDL 37* 05/19/2010 0520   CHOLHDL 2.5 05/19/2010 0520   VLDL 19 05/19/2010 0520   LDLCALC  05/19/2010 0520    37        Total Cholesterol/HDL:CHD Risk Coronary Heart Disease Risk Table                     Men   Women  1/2 Average Risk   3.4   3.3  Average Risk       5.0   4.4  2 X Average Risk   9.6   7.1  3 X Average Risk  23.4   11.0        Use the calculated Patient Ratio above and the CHD Risk Table to determine the patient's CHD Risk.        ATP III CLASSIFICATION (LDL):  <100     mg/dL   Optimal  100-129  mg/dL   Near or Above                    Optimal  130-159  mg/dL   Borderline  160-189  mg/dL  High  >190     mg/dL   Very High   HgbA1c: No results found for: HGBA1C Urine Drug Screen: No results found for: LABOPIA, COCAINSCRNUR, LABBENZ, AMPHETMU, THCU, LABBARB    IMAGING  Ct Head Wo Contrast 10/07/2015   1. Similar size of parenchymal hemorrhage centered at the posterior right temporal lobe, estimated volume 2.2 cc. Minimal localized edema without significant mass effect.  2. Moderate chronic microvascular ischemic disease.   Ct Head Wo Contrast 10/06/2015   1. Examination is positive for a hyperdense mass within the right posterior temporoparietal lobe. In the acute setting finding is concerning for a focal hemorrhagic infarct.  2. Mild chronic small vessel ischemic change.  Ct Angio Chest Pe W/cm &/or Wo Cm 10/06/2015   No CT evidence of pulmonary embolism. Cardiomegaly with mild edema or congestive changes. No focal consolidation.    Dg Chest Portable 1 View 10/06/2015   No acute cardiopulmonary abnormality seen.   Carotid Ultrasound 10/07/2015 No significant extracranial carotid artery stenosis demonstrated. Right vertebral artery was not clearly visualized. Left vertebral artery demonstrated antegrade  flow.    PHYSICAL EXAM HEENT- Normocephalic, no lesions, without obvious abnormality. Sclera extremely injected.  Normal external nose, mucus membranes and septum. Neck supple.   Cardiovascular - regular rate and rhythm, S1, S2 normal, no murmur, click, rub or gallop  Lungs - distant breath sounds  Abdomen - soft, non-tender; bowel sounds normal; no masses, no organomegaly  Extremities - no joint deformities, effusion, or inflammation, mild edema involving distal legs and feet  Neurologic Examination: Mental Status: Lethargic and requires noxious stimuli to awaken.  However, oriented x 4, no acute distress. Speech fluent without evidence of aphasia. Able to follow commands with repeat stimulation to achieve wakefulness. Cranial Nerves: III/IV/VI-Pupils were equal and reacted normally to light. OCRs intact  V/VII-grossly no facial weakness. VIII-normal. X-normal speech and symmetrical palatal movement. XII- protrudes tongue midline  Sensory: withdraws x 4  Cerebellar and Gait testing deferred due to LOC  ASSESSMENT/PLAN [name] is a 55 y.o. female with history of hypertension presenting with right temporal ICH and seizure. She did not receive IV t-PA due to [place].   Stroke:  ICH  Non-Dominant right likely secondary to hypertension  MRI  not performed  MRA  not performed  Carotid Doppler - as above - Right vertebral artery was not clearly visualized.  2D Echo  pending  LDL pending   HgbA1c pending  SCDs for VTE prophylaxis Diet Carb Modified Fluid consistency:: Thin; Room service appropriate?: Yes  No antithrombotic prior to admission, now on No antithrombotic  Patient counseled to be compliant with her antithrombotic medications:  N/A  Ongoing aggressive stroke risk factor management  Therapy recommendations:  pending  Disposition:  pending  Hypertension  Unstable  ICH:  Goal SBP< 160  Long-term BP goal  normotensive  Hyperlipidemia  Home meds: N/A  LDL pending, goal < 7  Diabetes  HgbA1c pending, goal < 7.0  Controlled status pending  Other Stroke Risk Factors  No Advanced age  Cigarette smoker denies  ETOH use denied  Obesity, Body mass index is 53.76 kg/(m^2)., recommend weight loss, diet and exercise as appropriate   No Hx stroke/TIA  Positive Family hx stroke   No Coronary artery disease  No Migraines  No Diagnosis of obstructive sleep apnea; However, patient positive for significant snoring  Other Active Problems  Hypokalemia resolved.  Microcytic anemia:  Iron and TIBC - low - Will not add  iron at this time as contraindicated with acute ICH  Leukocytosis: Resolved  Serial cardiac enzymes with elevation.  Cards following and believe numbers may not be related to ischemia. I ordered b-blocker for BP and rate control. Troponins - 0.17 -> 0.10 -> 0.08 (trending down)  CXR with: 1. Cardiomegaly with increasing interstitial edema.  2. Progressive low lung volumes.  Will check B-peptide   Patient will need to be evaluated for sleep apnea    Hospital day # 2  CRITICAL CARE NEUROLOGY ATTENDING NOTE Patient was seen and examined by me personally. I independently viewed imaging studies, participated in medical decision making and plan of care. The laboratory and radiographic studies were personally reviewed by me.  ROS completed by me personally and pertinent positives fully documented. Assessment and plan completed by me personally and  documented above.  Condition is unchanged  Plan today:  OOB to chair as tolerated  Add Norvasc 5mg  daily   Repeat CXR in AM  BNP added to AM labs.  pending  This patient is critically ill and at significant risk of neurological worsening, death and care requires constant monitoring of vital signs, hemodynamics,respiratory and cardiac monitoring, extensive review of multiple databases, frequent neurological assessment,  discussion with family, other specialists and medical decision making of high complexity.  This critical care time does not reflect procedure time, or teaching time or supervisory time of PA/NP/Med Resident etc. but could involve care discussion time.  I spent 45 minutes of Neurocritical Care time in the care of  this patient.  SIGNED BY: Dr. Elissa Hefty    ADDENDUM:  Patient with two observed spells during which she briefly stares and stops speaking 10-20 seconds in duration.  One in the AM and one in the afternoon.  First episode directly after brief O2 desaturation; however, the second was not related to other factors.  With complete return to baseline.  Keppra 1000mg  bolus given and 750mg  BID started.  Of note patient very likely has sleep apnea as well and will need formal evaluation.    Addendum:  Patient with new onset decreased responsiveness.  Ordered STAT head CT to be completed before 17:00 and will order cEEG.  Will follow-up on CT when done   To contact Stroke Continuity provider, please refer to [email or web address] After hours, contact General Neurology

## 2015-10-08 NOTE — Progress Notes (Signed)
During assessment, patient was found to be more difficult to arouse. Patient oriented times four, following commands in all extremeties, with pupils equal, 2 mm and sluggish, but more difficult to assess due to being obtunded. No weakness noted or any other neurological changes. Stroke services alerted of the concern. Orders received for a STAT CT of head without contrast. Will continue to monitor.

## 2015-10-08 NOTE — Progress Notes (Signed)
LTM EEG started per Dr Belenda Cruise. Dr Tasia Catchings (Neurohospitalist) made aware the study was running.

## 2015-10-08 NOTE — Progress Notes (Signed)
Subjective:  Minimal trop elevation trending down.  No c/o of SOB/chest pain.    Objective:  Vital Signs in the last 24 hours: BP 123/72 mmHg  Pulse 93  Temp(Src) 99.3 F (37.4 C) (Axillary)  Resp 20  Ht 4\' 11"  (1.499 m)  Wt 120.8 kg (266 lb 5.1 oz)  BMI 53.76 kg/m2  SpO2 100%  LMP 02/17/2011  Physical Exam: Morbidly obese black female in no acute distress, lethargic today Lungs:  Reduced breath sounds Cardiac:  Regular rhythm, normal S1 and S2, no S3 Extremities:  No edema present  Intake/Output from previous day: 07/15 0701 - 07/16 0700 In: 2265 [P.O.:540; I.V.:1725] Out: 525 [Urine:525]  Weight Filed Weights   10/06/15 1706 10/06/15 2345  Weight: 113.399 kg (250 lb) 120.8 kg (266 lb 5.1 oz)    Lab Results: Basic Metabolic Panel:  Recent Labs  10/07/15 0814 10/08/15 0541  NA 139 140  K 3.7 4.1  CL 106 105  CO2 26 30  GLUCOSE 100* 113*  BUN 13 15  CREATININE 0.91 1.10*   CBC:  Recent Labs  10/06/15 1812 10/07/15 0814 10/08/15 0541  WBC 12.7* 8.5 9.9  NEUTROABS 10.5*  --   --   HGB 10.9* 10.3* 9.2*  HCT 37.2 35.5* 33.1*  MCV 72.7* 73.8* 75.9*  PLT 262 264 263   Cardiac Enzymes: Troponin (Point of Care Test)  Recent Labs  10/06/15 2120  TROPIPOC 0.14*   Cardiac Panel (last 3 results)  Recent Labs  10/07/15 0814 10/07/15 1637 10/07/15 2038  CKTOTAL 666* 548* 507*  CKMB 5.3* 4.4 3.4  TROPONINI 0.17* 0.10* 0.08*  RELINDX 0.8 0.8 0.7    Telemetry: Normal sinus rhythm  Assessment/Plan:  1.  Elevation of troponin that is likely due to intracerebral hemorrhage-I doubt that this is ischemia 2.  Probable undiagnosed hypertension previously and untreated 3.  Morbid obesity 4.  Elevation of CPK and MB likely due to seizure activity and possible early rhabdo. 5. Weight gain  Recommendations:  Echo pending at this time.  I would reduce her IV fluids with weight gain.  Check BNP     W. Doristine Church  MD  [facility]  10/08/2015, 12:02 PM

## 2015-10-09 ENCOUNTER — Inpatient Hospital Stay (HOSPITAL_COMMUNITY): Payer: Medicaid Other

## 2015-10-09 DIAGNOSIS — I639 Cerebral infarction, unspecified: Secondary | ICD-10-CM

## 2015-10-09 DIAGNOSIS — R7989 Other specified abnormal findings of blood chemistry: Secondary | ICD-10-CM

## 2015-10-09 LAB — BASIC METABOLIC PANEL
Anion gap: 7 (ref 5–15)
BUN: 14 mg/dL (ref 6–20)
CALCIUM: 9 mg/dL (ref 8.9–10.3)
CHLORIDE: 105 mmol/L (ref 101–111)
CO2: 26 mmol/L (ref 22–32)
CREATININE: 0.92 mg/dL (ref 0.44–1.00)
GFR calc non Af Amer: >60 mL/min (ref >60)
Glucose, Bld: 91 mg/dL (ref 65–99)
Potassium: 4.3 mmol/L (ref 3.5–5.1)
SODIUM: 138 mmol/L (ref 135–145)

## 2015-10-09 LAB — GLUCOSE, CAPILLARY
GLUCOSE-CAPILLARY: 115 mg/dL — AB (ref 65–99)
GLUCOSE-CAPILLARY: 94 mg/dL (ref 65–99)
Glucose-Capillary: 102 mg/dL — ABNORMAL HIGH (ref 65–99)
Glucose-Capillary: 99 mg/dL (ref 65–99)
Glucose-Capillary: 127 mg/dL — ABNORMAL HIGH (ref 65–99)

## 2015-10-09 LAB — ECHOCARDIOGRAM COMPLETE
HEIGHTINCHES: 59 in
Weight: 4261.05 oz

## 2015-10-09 LAB — CBC
HCT: 33.9 % — ABNORMAL LOW (ref 36.0–46.0)
HEMOGLOBIN: 9.5 g/dL — AB (ref 12.0–15.0)
MCH: 21.1 pg — AB (ref 26.0–34.0)
MCHC: 28 g/dL — AB (ref 30.0–36.0)
MCV: 75.2 fL — ABNORMAL LOW (ref 78.0–100.0)
Platelets: 222 10*3/uL (ref 150–400)
RBC: 4.51 MIL/uL (ref 3.87–5.11)
RDW: 17.9 % — AB (ref 11.5–15.5)
WBC: 8.3 10*3/uL (ref 4.0–10.5)

## 2015-10-09 LAB — HEMOGLOBIN A1C
Hgb A1c MFr Bld: 5.7 % — ABNORMAL HIGH (ref 4.8–5.6)
MEAN PLASMA GLUCOSE: 117 mg/dL

## 2015-10-09 LAB — CK: CK TOTAL: 240 U/L — AB (ref 38–234)

## 2015-10-09 MED ORDER — IOPAMIDOL (ISOVUE-370) INJECTION 76%
INTRAVENOUS | Status: AC
Start: 2015-10-09 — End: 2015-10-09
  Administered 2015-10-09: 80 mL
  Filled 2015-10-09: qty 100

## 2015-10-09 MED ORDER — HEPARIN SODIUM (PORCINE) 5000 UNIT/ML IJ SOLN
5000.0000 [IU] | Freq: Three times a day (TID) | INTRAMUSCULAR | Status: DC
  Administered 2015-10-09 – 2015-10-10 (×5): 5000 [IU] via SUBCUTANEOUS
  Filled 2015-10-09 (×5): qty 1

## 2015-10-09 MED ORDER — CLONIDINE HCL 0.1 MG PO TABS
0.1000 mg | ORAL_TABLET | Freq: Three times a day (TID) | ORAL | Status: DC
  Administered 2015-10-09 – 2015-10-10 (×3): 0.1 mg via ORAL
  Filled 2015-10-09 (×2): qty 1

## 2015-10-09 MED ORDER — LABETALOL HCL 5 MG/ML IV SOLN
10.0000 mg | INTRAVENOUS | Status: DC | PRN
  Administered 2015-10-09 (×2): 20 mg via INTRAVENOUS
  Administered 2015-10-09: 40 mg via INTRAVENOUS
  Administered 2015-10-09: 20 mg via INTRAVENOUS
  Filled 2015-10-09: qty 4
  Filled 2015-10-09: qty 8
  Filled 2015-10-09 (×2): qty 4

## 2015-10-09 NOTE — Progress Notes (Signed)
VASCULAR LAB PRELIMINARY  PRELIMINARY  PRELIMINARY  PRELIMINARY  Bilateral lower extremity venous duplex has been completed.    Preliminary report:  Bilateral:  No evidence of DVT, superficial thrombosis, or Baker's Cyst.  Exam difficult due to depth of vessel and patients habitus  Janifer Adie, RVT, RDMS 10/09/2015, 5:28 PM

## 2015-10-09 NOTE — Progress Notes (Signed)
LTM D/C'd per Dr Erlinda Hong

## 2015-10-09 NOTE — Procedures (Signed)
Electroencephalogram report- LTM  Ordering Physician : Dr. Erlinda Hong EEG number: [phone]    Beginning date or time: 10/08/2015 5:00PM Ending date or time: 10/09/2015 8:39AM  Day of study: day 1  Medications include: Per EMR  HISTORY: This 24 hours of intensive EEG monitoring with simultaneous video monitoring was performed for this patient with intracranial hemorrhage and altered mental status. This EEG was requested to rule out subclinical electrographic seizures.  TECHNICAL DESCRIPTION:  The study consists of a continuous 16-channel multi-montage digital video EEG recording with twenty-one electrodes placed according to the International 10-20 System. Additional leads included eye leads, true temporal leads (T1, T2), and an EKG lead. Activation procedures were not done due to mental status.  REPORT: The background activity in this tracing consisted of polymorphic delta and theta background, with best of 6.5  Hz. The activity is symmetrical and reactive to tactile stimuli. Intermittently, diffuse rhythmic delta activity was seen, No focal slowing or epileptiform activity was identified.   IMPRESSION: This is an abnormal EEG due to: 1) Diffuse slowing - background slowing and diffuse rhythmic delta activity  CLINICAL CORRELATION: This EEG is consistent with non-specific diffuse cerebral dysfunction. No electrographic seizures were seen.

## 2015-10-09 NOTE — Progress Notes (Signed)
OT Cancellation Note  Patient Details Name: [name] MRN: [id number] DOB: 1960/12/30   Cancelled Treatment:    Reason Eval/Treat Not Completed: Patient at procedure or test/ unavailable (EEG continuous at this time) OT to check back after EEG stopped.  Vonita Moss   OTR/L Pager: [phone] Office: [phone] .  10/09/2015, 8:08 AM

## 2015-10-09 NOTE — Progress Notes (Signed)
STROKE TEAM PROGRESS NOTE   SUBJECTIVE (INTERVAL HISTORY) No Family at the bedside this morning. She continued to be lethargic for AM evaluations but will still respond correctly to questions and following commands. BP 169/83 this am. Of note, she received one dose labetalol at 5am. LTM EEG no seizure, will discontinue so that she can have CT or MRI.    OBJECTIVE Temp:  [98.3 F (36.8 C)-99.5 F (37.5 C)] 98.3 F (36.8 C) (07/17 0400) Pulse Rate:  [76-96] 81 (07/17 0800) Cardiac Rhythm:  [-]  Resp:  [16-27] 19 (07/17 0800) BP: (123-189)/(72-100) 169/83 mmHg (07/17 0800) SpO2:  [87 %-100 %] 97 % (07/17 0800)  CBC:   Recent Labs Lab 10/06/15 1812  10/08/15 0541 10/09/15 0317  WBC 12.7*  < > 9.9 8.3  NEUTROABS 10.5*  --   --   --   HGB 10.9*  < > 9.2* 9.5*  HCT 37.2  < > 33.1* 33.9*  MCV 72.7*  < > 75.9* 75.2*  PLT 262  < > 263 222  < > = values in this interval not displayed.  Basic Metabolic Panel:   Recent Labs Lab 10/08/15 0541 10/09/15 0317  NA 140 138  K 4.1 4.3  CL 105 105  CO2 30 26  GLUCOSE 113* 91  BUN 15 14  CREATININE 1.10* 0.92  CALCIUM 9.0 9.0    Lipid Panel:     Component Value Date/Time   CHOL 112 10/08/2015 0541   TRIG 62 10/08/2015 0541   HDL 42 10/08/2015 0541   CHOLHDL 2.7 10/08/2015 0541   VLDL 12 10/08/2015 0541   LDLCALC 58 10/08/2015 0541   HgbA1c: No results found for: HGBA1C Urine Drug Screen: No results found for: LABOPIA, COCAINSCRNUR, LABBENZ, AMPHETMU, THCU, LABBARB    IMAGING I have personally reviewed the radiological images below and agree with the radiology interpretations.  Ct Head Wo Contrast 10/07/2015   1. Similar size of parenchymal hemorrhage centered at the posterior right temporal lobe, estimated volume 2.2 cc. Minimal localized edema without significant mass effect.  2. Moderate chronic microvascular ischemic disease.   10/06/2015   1. Examination is positive for a hyperdense mass within the right posterior  temporoparietal lobe. In the acute setting finding is concerning for a focal hemorrhagic infarct.  2. Mild chronic small vessel ischemic change.  10/08/2015  IMPRESSION: 2 x 1.9 x 1.3 cm (volume = 2.6 cc) posterior right temporal lobe hemorrhage without significant change from prior examination (when utilizing similar landmarks). The degree of surrounding vasogenic edema is minimally more prominent. No new intracranial hemorrhage.   Ct Angio Chest Pe W/cm &/or Wo Cm 10/06/2015   No CT evidence of pulmonary embolism. Cardiomegaly with mild edema or congestive changes. No focal consolidation.   Carotid Ultrasound 10/07/2015 No significant extracranial carotid artery stenosis demonstrated. Right vertebral artery was not clearly visualized. Left vertebral artery demonstrated antegrade flow.  LTM EEG pending  TTE pending  LE venous doppler pending  CTA head and neck pending   PHYSICAL EXAM HEENT- Normocephalic, no lesions, without obvious abnormality. Sclera extremely injected.  Normal external nose, mucus membranes and septum. Neck supple.   Cardiovascular - regular rate and rhythm, S1, S2 normal, no murmur, click, rub or gallop  Lungs - distant breath sounds  Abdomen - soft, non-tender; bowel sounds normal; no masses, no organomegaly  Extremities - no joint deformities, effusion, or inflammation, mild edema involving distal legs and feet  Neurologic Examination: Mental Status: Lethargic and requires noxious stimuli  to awaken.  However, oriented x 3, no acute distress. Speech mild dysarthria due to level of consciousness without evidence of aphasia. Able to follow commands with repeat stimulation to achieve wakefulness. Cranial Nerves: III/IV/VI-Pupils were equal and reacted normally to light. Questionable for left VI partial palsy.  V/VII-grossly no facial weakness. VIII-normal. X-normal speech and symmetrical palatal movement. XII- protrudes tongue midline  Motor: LEs  withdraws equally, UEs against gravity but asterixis presents bilaterally  Sensation, cerebellar and Gait testing deferred due to lethargy and drowsiness.   ASSESSMENT/PLAN [name] is a 55 y.o. female with history of hypertension presenting with right temporal ICH and seizure. She did not receive IV t-PA due to [place].   ICH - right temporal small ICH, need to rule out vein of [place] venous thrombosis  MRI  Will order after CTA  CTA and CTV to evaluate cerebral vasculature  Carotid Doppler - Right VA not clearly visualized.  2D Echo  pending  LE venous doppler pending  LTM EEG no seizure preliminary read  LDL 58   HgbA1c pending  SCDs and heparin subq for VTE prophylaxis Diet Carb Modified Fluid consistency:: Thin; Room service appropriate?: Yes  No antithrombotic prior to admission, now on No antithrombotic. If CVT confirmed, she need anticoagulation.  Ongoing aggressive stroke risk factor management  Therapy recommendations:  pending  Disposition:  pending  Hypertension  Unstable ICH:  Goal SBP< 160 Add amlodipine Continue metoprolol 25mg  bid   Other Stroke Risk Factors  Morbid obesity, Body mass index is 53.76 kg/(m^2)., recommend weight loss, diet and exercise as appropriate   Positive Family hx stroke   Probable obstructive sleep apnea - need outpt evaluation  Other Active Problems  Hypokalemia resolved.  Microcytic anemia:  Iron and TIBC - low - Will not add iron at this time as contraindicated with acute [facility] day # 3  This patient is critically ill due to [place] and questionable for CVT and at significant risk of neurological worsening, death form cerebral edema, brain herniation, recurrent ICH, respiratory failure. This patient's care requires constant monitoring of vital signs, hemodynamics, respiratory and cardiac monitoring, review of multiple databases, neurological assessment, discussion with family, other specialists and medical  decision making of high complexity. I spent 40 minutes of neurocritical care time in the care of this patient.  Rosalin Hawking, MD PhD Stroke Neurology 10/09/2015 8:52 AM     To contact Stroke Continuity provider, please refer to [email or web address] After hours, contact General Neurology

## 2015-10-09 NOTE — Progress Notes (Signed)
PT Cancellation Note  Patient Details Name: [name] MRN: [id number] DOB: 12-Nov-1960   Cancelled Treatment:    Reason Eval/Treat Not Completed: Patient not medically ready (currently having a continuous EEG performed)   [name] 10/09/2015, 8:09 AM [name], [place] DPT  [phone]

## 2015-10-09 NOTE — Progress Notes (Signed)
Echocardiogram 2D Echocardiogram has been performed.  [name] 10/09/2015, 1:23 PM

## 2015-10-10 ENCOUNTER — Inpatient Hospital Stay (HOSPITAL_COMMUNITY): Payer: Medicaid Other

## 2015-10-10 DIAGNOSIS — G4733 Obstructive sleep apnea (adult) (pediatric): Secondary | ICD-10-CM

## 2015-10-10 LAB — BASIC METABOLIC PANEL
Anion gap: 7 (ref 5–15)
BUN: 14 mg/dL (ref 6–20)
CALCIUM: 9.3 mg/dL (ref 8.9–10.3)
CHLORIDE: 104 mmol/L (ref 101–111)
CO2: 29 mmol/L (ref 22–32)
CREATININE: 1 mg/dL (ref 0.44–1.00)
GFR calc non Af Amer: >60 mL/min (ref >60)
Glucose, Bld: 91 mg/dL (ref 65–99)
POTASSIUM: 4.2 mmol/L (ref 3.5–5.1)
SODIUM: 140 mmol/L (ref 135–145)

## 2015-10-10 LAB — CBC
HEMATOCRIT: 33.7 % — AB (ref 36.0–46.0)
HEMOGLOBIN: 9.5 g/dL — AB (ref 12.0–15.0)
MCH: 21.2 pg — AB (ref 26.0–34.0)
MCHC: 28.2 g/dL — AB (ref 30.0–36.0)
MCV: 75.2 fL — ABNORMAL LOW (ref 78.0–100.0)
Platelets: 250 10*3/uL (ref 150–400)
RBC: 4.48 MIL/uL (ref 3.87–5.11)
RDW: 18 % — ABNORMAL HIGH (ref 11.5–15.5)
WBC: 8.1 10*3/uL (ref 4.0–10.5)

## 2015-10-10 LAB — GLUCOSE, CAPILLARY
GLUCOSE-CAPILLARY: 115 mg/dL — AB (ref 65–99)
GLUCOSE-CAPILLARY: 118 mg/dL — AB (ref 65–99)
Glucose-Capillary: 131 mg/dL — ABNORMAL HIGH (ref 65–99)
Glucose-Capillary: 111 mg/dL — ABNORMAL HIGH (ref 65–99)

## 2015-10-10 MED ORDER — CLONIDINE HCL 0.1 MG PO TABS
0.2000 mg | ORAL_TABLET | Freq: Three times a day (TID) | ORAL | Status: DC
  Administered 2015-10-10 – 2015-10-11 (×5): 0.2 mg via ORAL
  Filled 2015-10-10 (×5): qty 2

## 2015-10-10 MED ORDER — CHLORTHALIDONE 25 MG PO TABS
25.0000 mg | ORAL_TABLET | Freq: Every day | ORAL | Status: DC
  Administered 2015-10-11 – 2015-10-12 (×2): 25 mg via ORAL
  Filled 2015-10-10 (×2): qty 1

## 2015-10-10 MED ORDER — AMLODIPINE BESYLATE 5 MG PO TABS: 5.0000 mg | ORAL_TABLET | Freq: Every day | ORAL | Status: DC

## 2015-10-10 NOTE — Progress Notes (Signed)
Subjective:  Now on the floor.  No complaints of shortness of breath or chest pain.  Still mildly lethargic.  Blood pressure remains elevated.  Echocardiogram shows concentric LVH with normal systolic function and no wall motion abnormalities.  Objective:  Vital Signs in the last 24 hours: BP 158/87 mmHg  Pulse 70  Temp(Src) 98.6 F (37 C) (Oral)  Resp 22  Ht 4\' 11"  (1.499 m)  Wt 120.8 kg (266 lb 5.1 oz)  BMI 53.76 kg/m2  SpO2 97%  LMP 02/17/2011  Physical Exam: Morbidly obese black female in no acute distress, lethargic today Lungs:  Reduced breath sounds Cardiac:  Regular rhythm, normal S1 and S2, no S3 Extremities:  No edema present  Intake/Output from previous day: 07/17 0701 - 07/18 0700 In: 260 [P.O.:260] Out: -   Weight Filed Weights   10/06/15 1706 10/06/15 2345  Weight: 113.399 kg (250 lb) 120.8 kg (266 lb 5.1 oz)    Lab Results: Basic Metabolic Panel:  Recent Labs  10/09/15 0317 10/10/15 0352  NA 138 140  K 4.3 4.2  CL 105 104  CO2 26 29  GLUCOSE 91 91  BUN 14 14  CREATININE 0.92 1.00   CBC:  Recent Labs  10/09/15 0317 10/10/15 0352  WBC 8.3 8.1  HGB 9.5* 9.5*  HCT 33.9* 33.7*  MCV 75.2* 75.2*  PLT 222 250   Cardiac Enzymes: Troponin (Point of Care Test) No results for input(s): TROPIPOC in the last 72 hours. Cardiac Panel (last 3 results)  Recent Labs  10/07/15 1637 10/07/15 2038 10/09/15 0317  CKTOTAL 548* 507* 240*  CKMB 4.4 3.4  --   TROPONINI 0.10* 0.08*  --   RELINDX 0.8 0.7  --     Telemetry: Normal sinus rhythm  Assessment/Plan:  1.  Elevation of troponin that is likely due to intracerebral hemorrhage-I doubt that this is ischemia 2.  Hypertensive heart disease with LVH 3.  Morbid obesity 4.  Elevation of CPK and MB likely due to seizure activity and possible early rhabdo. 5. Weight gain  Recommendations:  BNP was mildly elevated.  She has hypertensive heart disease according to echocardiogram.  Likely the  hemorrhage was hypertensive related.  Weight loss will help in the future.  She will need a diuretic added to her regimen as Blacks responded well to a diuretic-based regimen.  I would also add amlodipine.  The clonidine may be causing lethargy and might need to back down on this once the other medicines take effect.     Kerry Hough  MD [facility] Cardiology  10/10/2015, 12:29 PM

## 2015-10-10 NOTE — Progress Notes (Signed)
Physical Therapy Treatment Patient Details Name: [name] MRN: [id number] DOB: [birth year] Today's Date: 10/10/2015    History of Present Illness pt is a 55 y/o female with h/o HTN admitted to ED after sudden loss of consciousness and witnessed generalized seizue activity.  CT scan showing a hemorrhagic infarct in the right posterior temporoparietal lobe.    PT Comments    Patient seen in conjunction with OT therapist for session. Patient with limited overall mobility secondary to dizziness and balance deficits. Patient tolerated some ambulation and functional task performance but required assist of 2 for safety. Continue to recommend CIR post acute discharge. Will follow.  Follow Up Recommendations  CIR     Equipment Recommendations  Other (comment) (TBA)    Recommendations for Other Services Rehab consult     Precautions / Restrictions Precautions Precautions: Fall    Mobility  Bed Mobility Overal bed mobility: Needs Assistance Bed Mobility: Supine to Sit     Supine to sit: Min assist        Transfers Overall transfer level: Needs assistance Equipment used: Rolling walker (2 wheeled) Transfers: Sit to/from Stand Sit to Stand: Min assist;+2 safety/equipment         General transfer comment: performed from chair x3 and BSC x2, Multi modal cues for hand placement and safety, assist to power up to standing and stabilize  Ambulation/Gait Ambulation/Gait assistance: Min assist;+2 safety/equipment (Moderate assist upon reaching 20 ft due to dizziness ) Ambulation Distance (Feet): 20 Feet Assistive device: Rolling walker (2 wheeled) Gait Pattern/deviations: Step-through pattern;Decreased stride length;Trunk flexed;Wide base of support Gait velocity: slow   General Gait Details: significant dizziness (room spinning to left) reported in standing (required chair follow)   Stairs            Wheelchair Mobility    Modified Rankin (Stroke Patients  Only) Modified Rankin (Stroke Patients Only) Pre-Morbid Rankin Score: No symptoms Modified Rankin: Moderately severe disability     Balance   Sitting-balance support: Feet supported Sitting balance-Leahy Scale: Fair     Standing balance support: During functional activity Standing balance-Leahy Scale: Poor (posterior bias at times) Standing balance comment: reliance on assist and UE support                    Cognition Arousal/Alertness: Lethargic Behavior During Therapy: Flat affect Overall Cognitive Status:  (need to further assess)                      Exercises      General Comments General comments (skin integrity, edema, etc.): BP assessed in sitting EOB and standing 161/103 161/109      Pertinent Vitals/Pain Pain Assessment: Faces Faces Pain Scale: Hurts little more Pain Location: head Pain Descriptors / Indicators: Other (Comment) (nausea/dizziness) Pain Intervention(s): Limited activity within patient's tolerance    Home Living Family/patient expects to be discharged to:: Private residence Living Arrangements: Spouse/significant other;Other relatives (brother and sister in law) Available Help at Discharge: Family;Available 24 hours/day Type of Home: House Home Access: Stairs to enter Entrance Stairs-Rails: Right;Left Home Layout: One level        Prior Function Level of Independence: Independent      Comments: working as Psychologist, counselling   PT Goals (current goals can now be found in the care plan section) Acute Rehab PT Goals Patient Stated Goal: to be independent again and go back to work PT Goal Formulation: Patient unable to participate in goal setting Time For Goal Achievement:  10/22/15 Potential to Achieve Goals: Good Progress towards PT goals: Progressing toward goals    Frequency  Min 3X/week    PT Plan Current plan remains appropriate    Co-evaluation PT/OT/SLP Co-Evaluation/Treatment: Yes Reason for Co-Treatment:  For patient/therapist safety PT goals addressed during session: Mobility/safety with mobility OT goals addressed during session: ADL's and self-care     End of Session   Activity Tolerance: Patient tolerated treatment well Patient left: in chair;with call bell/phone within reach;with family/visitor present     Time: [id number] PT Time Calculation (min) (ACUTE ONLY): 24 min  Charges:  $Therapeutic Activity: 8-22 mins                    G CodesDuncan Dull Oct 20, 2015, 5:51 PM [name], [place] DPT  [phone]

## 2015-10-10 NOTE — Progress Notes (Signed)
STROKE TEAM PROGRESS NOTE   SUBJECTIVE (INTERVAL HISTORY) Patient denies hx of HTN. States she does not have a PCP and had not see doctors. Cousin arrived during rounds. She has no new complaints. She is more awake alert today and conversing well. She stated that she usually at home not able to sleep well at night because not able to breath due to airway obstruction. However, she has not had any doctor evaluation for OSA or any CPAP trials.    OBJECTIVE Temp:  [98.1 F (36.7 C)-98.8 F (37.1 C)] 98.6 F (37 C) (07/18 0800) Pulse Rate:  [39-88] 72 (07/18 0800) Cardiac Rhythm:  [-] Normal sinus rhythm (07/18 0800) Resp:  [15-25] 21 (07/18 0800) BP: (122-185)/(71-109) 159/83 mmHg (07/18 0800) SpO2:  [86 %-100 %] 98 % (07/18 0800)  CBC:   Recent Labs Lab 10/06/15 1812  10/09/15 0317 10/10/15 0352  WBC 12.7*  < > 8.3 8.1  NEUTROABS 10.5*  --   --   --   HGB 10.9*  < > 9.5* 9.5*  HCT 37.2  < > 33.9* 33.7*  MCV 72.7*  < > 75.2* 75.2*  PLT 262  < > 222 250  < > = values in this interval not displayed.  Basic Metabolic Panel:   Recent Labs Lab 10/09/15 0317 10/10/15 0352  NA 138 140  K 4.3 4.2  CL 105 104  CO2 26 29  GLUCOSE 91 91  BUN 14 14  CREATININE 0.92 1.00  CALCIUM 9.0 9.3    Lipid Panel:     Component Value Date/Time   CHOL 112 10/08/2015 0541   TRIG 62 10/08/2015 0541   HDL 42 10/08/2015 0541   CHOLHDL 2.7 10/08/2015 0541   VLDL 12 10/08/2015 0541   LDLCALC 58 10/08/2015 0541   HgbA1c:  Lab Results  Component Value Date   HGBA1C 5.7* 10/07/2015   Urine Drug Screen: No results found for: LABOPIA, COCAINSCRNUR, LABBENZ, AMPHETMU, THCU, LABBARB    IMAGING I have personally reviewed the radiological images below and agree with the radiology interpretations.  Ct Head Wo Contrast 10/07/2015   1. Similar size of parenchymal hemorrhage centered at the posterior right temporal lobe, estimated volume 2.2 cc. Minimal localized edema without significant mass  effect.  2. Moderate chronic microvascular ischemic disease.   10/06/2015   1. Examination is positive for a hyperdense mass within the right posterior temporoparietal lobe. In the acute setting finding is concerning for a focal hemorrhagic infarct.  2. Mild chronic small vessel ischemic change.  10/08/2015  IMPRESSION: 2 x 1.9 x 1.3 cm (volume = 2.6 cc) posterior right temporal lobe hemorrhage without significant change from prior examination (when utilizing similar landmarks). The degree of surrounding vasogenic edema is minimally more prominent. No new intracranial hemorrhage.   Ct Angio Chest Pe W/cm &/or Wo Cm 10/06/2015   No CT evidence of pulmonary embolism. Cardiomegaly with mild edema or congestive changes. No focal consolidation.   Carotid Ultrasound 10/07/2015 No significant extracranial carotid artery stenosis demonstrated. Right vertebral artery was not clearly visualized. Left vertebral artery demonstrated antegrade flow.  LTM EEG Diffuse slowing - background slowing and diffuse rhythmic delta activity. No electrographic seizures were seen.  TTE  - Left ventricle: The cavity size was normal. There was moderate concentric hypertrophy. Systolic function was normal. The estimated ejection fraction was in the range of 60% to 65%. Wall motion was normal; there were no regional wall motion abnormalities. Features are consistent with a pseudonormal left ventricular filling  pattern, with concomitant abnormal relaxation and increased filling pressure (grade 2 diastolic dysfunction). Doppler parameters are consistent with elevated ventricular end-diastolic filling pressure. - Aortic valve: Trileaflet; mildly thickened, mildly calcified leaflets. There was no regurgitation. - Aortic root: The aortic root was normal in size. - Mitral valve: Structurally normal valve. There was trivial regurgitation. - Left atrium: The atrium was normal in size. - Right ventricle: The cavity size was normal.  Wall thickness was normal. Systolic function was normal. - Right atrium: The atrium was normal in size. - Tricuspid valve: There was trivial regurgitation. - Pulmonic valve: There was no regurgitation. - Pulmonary arteries: Systolic pressure was within the normal range. - Inferior vena cava: The vessel was dilated. The respirophasic diameter changes were blunted (< 50%), consistent with elevated central venous pressure. - Pericardium, extracardiac: There was no pericardial effusion.  LE venous doppler  No evidence of DVT, superficial thrombosis, or Baker's Cyst.  CTA head and neck  10/09/2015 1. Negative for dural venous sinus thrombosis. The bilateral transverse and sigmoid sinuses are diminutive but patent. Both IJ bulbs are patent. Mildly prominent right jugular foramen pars nervosa incidentally noted.  2. Negative arterial findings on CTA head and neck aside from vessel tortuosity. Retropharyngeal course of both carotid arteries. 3. Stable appearance of posterior right hemisphere intra-axial hemorrhage since 10/06/2015. Mild surrounding edema with no significant mass effect. No associated cortical vein thrombosis is identified. No evidence of associated AVM. 4. Cardiomegaly re-demonstrated. Central pulmonary artery  enlargement may indicate a degree of pulmonary artery hypertension. Increased pulmonary septal thickening in the lung apices may indicate mild interstitial edema.  Mr Brain Wo Contrast  10/10/2015  IMPRESSION: 1. Stable size and appearance of posterior right temporal intraparenchymal hemorrhage with mild localized edema without significant mass effect. 2. Few additional scattered foci susceptibility artifact within the supratentorial and infratentorial brain, most consistent with small chronic micro hemorrhages. Underlying hypertensive etiology is favored. 3. Moderate chronic microvascular ischemic disease. 4. Empty sella.    PHYSICAL EXAM  Temp:  [98.1 F (36.7 C)-99.2 F  (37.3 C)] 99.2 F (37.3 C) (07/18 1228) Pulse Rate:  [39-82] 67 (07/18 1228) Resp:  [15-25] 20 (07/18 1228) BP: (122-179)/(71-110) 144/83 mmHg (07/18 1228) SpO2:  [86 %-100 %] 100 % (07/18 1228) FiO2 (%):  [2 %] 2 % (07/18 1228)  General - morbid obesity, well developed, in no apparent distress.  Ophthalmologic - Fundi not visualized due to eye movement.  Cardiovascular - Regular rate and rhythm.  Mental Status -  Level of arousal and orientation to time, place, and person were intact. Language including expression, naming, repetition, comprehension was assessed and found intact.  Cranial Nerves II - XII - II - Visual field intact OU. III, IV, VI - Extraocular movements intact. V - Facial sensation intact bilaterally. VII - Facial movement intact bilaterally. VIII - Hearing & vestibular intact bilaterally. X - Palate elevates symmetrically. XI - Chin turning & shoulder shrug intact bilaterally. XII - Tongue protrusion intact.  Motor Strength - The patient's strength was normal in all extremities and pronator drift was absent.  Bulk was normal and fasciculations were absent.   Motor Tone - Muscle tone was assessed at the neck and appendages and was normal.  Reflexes - The patient's reflexes were 1+ in all extremities and she had no pathological reflexes.  Sensory - Light touch, temperature/pinprick were assessed and were symmetrical.    Coordination - The patient had normal movements in the hands with no ataxia or dysmetria.  Tremor was absent.  Gait and Station - not tested.   ASSESSMENT/PLAN [name] is a 55 y.o. female with history of hypertension presenting with right temporal ICH and seizure. She did not receive IV t-PA due to [place].   ICH - right temporal small ICH, likely related to untreated HTN  MRI - right temporal stable ICH and no hemorrhagic infarct  Repeat CT x 3 - stable right temporal small ICH  CTA and CTV neg for AVM or venous sinus  thrombosis. Stable hemorrhage  Carotid Doppler - Right VA not clearly visualized.  2D Echo  EF 60-65%. No source of embolus   LE venous doppler negative for DVT  LTM EEG no seizure   LDL 58   HgbA1c 5.7  SCDs and heparin subq for VTE prophylaxis Diet Carb Modified Fluid consistency:: Thin; Room service appropriate?: Yes  No antithrombotic prior to admission  Ongoing aggressive stroke risk factor management  Therapy recommendations:  pending   Disposition:  pending  Hypertension  Unstable, still at high end  Goal SBP< 160  On amlodipine 5mg , clonidine 0.2mg  tid and metoprolol 25mg  bid   Other Stroke Risk Factors  Morbid obesity, Body mass index is 53.76 kg/(m^2)., recommend weight loss, diet and exercise as appropriate   Positive Family hx stroke   Probable obstructive sleep apnea - need outpt evaluation  Other Active Problems  Hypokalemia resolved.  Microcytic anemia:  Iron and TIBC - low - Will not add iron at this time as contraindicated with acute [facility] day # 4  This patient is critically ill due to [place] and questionable for CVT and at significant risk of neurological worsening, death form cerebral edema, brain herniation, recurrent ICH, respiratory failure. This patient's care requires constant monitoring of vital signs, hemodynamics, respiratory and cardiac monitoring, review of multiple databases, neurological assessment, discussion with family, other specialists and medical decision making of high complexity. I spent 35 minutes of neurocritical care time in the care of this patient.  Rosalin Hawking, MD PhD Stroke Neurology 10/10/2015 9:10 AM     To contact Stroke Continuity provider, please refer to [email or web address] After hours, contact General Neurology

## 2015-10-10 NOTE — Progress Notes (Signed)
Occupational Therapy Evaluation Patient Details Name: [name] MRN: [id number] DOB: [birth year] Today's Date: 10/10/2015    History of Present Illness pt is a 55 y/o female with h/o HTN admitted to ED after sudden loss of consciousness and witnessed generalized seizue activity.  CT scan showing a hemorrhagic infarct in the right posterior temporoparietal lobe.   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 Pt presents with functional deficits listed below and will benefit from CIR to maximize functional level of independence to facilitate safe D/C home. Pt continues to complain of "dizziness" with mobility. BP 161/103 sitting and 161/109 standing. Pt very motivated to return to PLOF and has a very supportive family. Will follow acutely to address established goals.     Follow Up Recommendations  CIR;Supervision/Assistance - 24 hour    Equipment Recommendations  3 in 1 bedside comode;Tub/shower bench    Recommendations for Other Services Rehab consult     Precautions / Restrictions Precautions Precautions: Fall      Mobility Bed Mobility Overal bed mobility: Needs Assistance Bed Mobility: Supine to Sit     Supine to sit: Min assist        Transfers Overall transfer level: Needs assistance Equipment used: Rolling walker (2 wheeled) Transfers: Sit to/from Stand Sit to Stand: Min assist;+2 safety/equipment         General transfer comment: performed from chair x3 and BSC x2, Multi modal cues for hand placement and safety, assist to power up to standing and stabilize    Balance   Sitting-balance support: Feet supported Sitting balance-Leahy Scale: Fair     Standing balance support: During functional activity Standing balance-Leahy Scale: Poor (posterior bias at times) Standing balance comment: reliance on assist and UE support                            ADL Overall ADL's : Needs  assistance/impaired Eating/Feeding: Modified independent   Grooming: Set up;Sitting;Supervision/safety   Upper Body Bathing: Set up;Supervision/ safety;Sitting   Lower Body Bathing: Moderate assistance;Sit to/from stand   Upper Body Dressing : Minimal assistance;Sitting Upper Body Dressing Details (indicate cue type and reason): pt falling posteriorly due to "dizziness" Lower Body Dressing: Moderate assistance;Sit to/from stand   Toilet Transfer: +2 for safety/equipment;Minimal assistance;RW;BSC   Toileting- Water quality scientist and Hygiene: Moderate assistance;Sit to/from stand       Functional mobility during ADLs: Minimal assistance;Rolling walker;Cueing for safety;Cueing for sequencing;+2 for safety/equipment General ADL Comments: limited at times by dizziness     Vision Vision Assessment?: Vision impaired- to be further tested in functional context Additional Comments: pt initially reporting visual deficits with increased difficulty seeing things on L sdie. As mobility progressed, pt reported improvements in vision. Will further assess. Pt had difficulty with sustained visual attention in addition to smooth pursuits and saccades. Need to further assess superior visual field.   Perception Perception Comments: will further assess.    Praxis Praxis Praxis-Other Comments: appears intact    Pertinent Vitals/Pain Pain Assessment: Faces Faces Pain Scale: Hurts little more Pain Location: head Pain Descriptors / Indicators: Other (Comment) (nausea/dizziness) Pain Intervention(s): Limited activity within patient's tolerance     Hand Dominance Right   Extremity/Trunk Assessment Upper Extremity Assessment Upper Extremity Assessment: Generalized weakness (using BUE for functional tasks)   Lower Extremity Assessment Lower Extremity Assessment: Generalized weakness;Defer to PT evaluation   Cervical / Trunk Assessment Cervical /  Trunk Assessment: Normal   Communication  Communication Communication: No difficulties   Cognition Arousal/Alertness: Lethargic Behavior During Therapy: Flat affect Overall Cognitive Status:  (need to further assess)                     General Comments       Exercises       Shoulder Instructions      Home Living Family/patient expects to be discharged to:: Private residence Living Arrangements: Spouse/significant other;Other relatives (brother and sister in law) Available Help at Discharge: Family;Available 24 hours/day Type of Home: House Home Access: Stairs to enter [employer] of Steps: several Entrance Stairs-Rails: Right;Left Home Layout: One level     Bathroom Shower/Tub: Teacher, early years/pre: Standard                Prior Functioning/Environment Level of Independence: Independent        Comments: working as Psychologist, counselling    OT Diagnosis: Generalized weakness;Cognitive deficits;Disturbance of vision;Acute pain   OT Problem List: Decreased strength;Decreased activity tolerance;Impaired balance (sitting and/or standing);Impaired vision/perception;Decreased cognition;Decreased safety awareness;Decreased knowledge of use of DME or AE;Cardiopulmonary status limiting activity;Obesity;Impaired UE functional use;Pain   OT Treatment/Interventions: Self-care/ADL training;Therapeutic exercise;Neuromuscular education;DME and/or AE instruction;Therapeutic activities;Cognitive remediation/compensation;Visual/perceptual remediation/compensation;Patient/family education;Balance training    OT Goals(Current goals can be found in the care plan section) Acute Rehab OT Goals Patient Stated Goal: to be independent again and go back to work OT Goal Formulation: With patient/family Time For Goal Achievement: 10/24/15 Potential to Achieve Goals: Good  OT Frequency: Min 2X/week   Barriers to D/C:            Co-evaluation PT/OT/SLP Co-Evaluation/Treatment: Yes Reason for  Co-Treatment: For patient/therapist safety   OT goals addressed during session: ADL's and self-care      End of Session Equipment Utilized During Treatment: Gait belt;Rolling walker Nurse Communication: Mobility status  Activity Tolerance: Patient tolerated treatment well Patient left: in chair;with call bell/phone within reach   Time: 1555-1619 OT Time Calculation (min): 24 min Charges:  OT General Charges $OT Visit: 1 Procedure OT Evaluation $OT Eval Moderate Complexity: 1 Procedure G-Codes:    Chuong Casebeer,HILLARY 10/18/15, 5:42 PM   [facility], OTR/L  [phone] 10/18/15

## 2015-10-11 DIAGNOSIS — N179 Acute kidney failure, unspecified: Secondary | ICD-10-CM

## 2015-10-11 DIAGNOSIS — R0682 Tachypnea, not elsewhere classified: Secondary | ICD-10-CM

## 2015-10-11 DIAGNOSIS — I5189 Other ill-defined heart diseases: Secondary | ICD-10-CM | POA: Insufficient documentation

## 2015-10-11 DIAGNOSIS — I519 Heart disease, unspecified: Secondary | ICD-10-CM

## 2015-10-11 DIAGNOSIS — I10 Essential (primary) hypertension: Secondary | ICD-10-CM

## 2015-10-11 DIAGNOSIS — I611 Nontraumatic intracerebral hemorrhage in hemisphere, cortical: Secondary | ICD-10-CM

## 2015-10-11 DIAGNOSIS — R5383 Other fatigue: Secondary | ICD-10-CM

## 2015-10-11 DIAGNOSIS — D62 Acute posthemorrhagic anemia: Secondary | ICD-10-CM

## 2015-10-11 LAB — BASIC METABOLIC PANEL
Anion gap: 5 (ref 5–15)
BUN: 14 mg/dL (ref 6–20)
CALCIUM: 9.3 mg/dL (ref 8.9–10.3)
CO2: 32 mmol/L (ref 22–32)
Chloride: 102 mmol/L (ref 101–111)
Creatinine, Ser: 0.94 mg/dL (ref 0.44–1.00)
GFR calc Af Amer: >60 mL/min (ref >60)
GLUCOSE: 104 mg/dL — AB (ref 65–99)
Potassium: 4.1 mmol/L (ref 3.5–5.1)
SODIUM: 139 mmol/L (ref 135–145)

## 2015-10-11 LAB — CBC
HCT: 32.6 % — ABNORMAL LOW (ref 36.0–46.0)
Hemoglobin: 9.1 g/dL — ABNORMAL LOW (ref 12.0–15.0)
MCH: 21 pg — AB (ref 26.0–34.0)
MCHC: 27.9 g/dL — ABNORMAL LOW (ref 30.0–36.0)
MCV: 75.1 fL — AB (ref 78.0–100.0)
PLATELETS: 266 10*3/uL (ref 150–400)
RBC: 4.34 MIL/uL (ref 3.87–5.11)
RDW: 17.9 % — AB (ref 11.5–15.5)
WBC: 8 10*3/uL (ref 4.0–10.5)

## 2015-10-11 LAB — GLUCOSE, CAPILLARY
Glucose-Capillary: 103 mg/dL — ABNORMAL HIGH (ref 65–99)
Glucose-Capillary: 131 mg/dL — ABNORMAL HIGH (ref 65–99)
Glucose-Capillary: 111 mg/dL — ABNORMAL HIGH (ref 65–99)
Glucose-Capillary: 111 mg/dL — ABNORMAL HIGH (ref 65–99)

## 2015-10-11 MED ORDER — AMLODIPINE BESYLATE 10 MG PO TABS
10.0000 mg | ORAL_TABLET | Freq: Every day | ORAL | Status: DC
  Administered 2015-10-11 – 2015-10-12 (×2): 10 mg via ORAL
  Filled 2015-10-11 (×2): qty 1

## 2015-10-11 NOTE — Progress Notes (Signed)
STROKE TEAM PROGRESS NOTE   SUBJECTIVE (INTERVAL HISTORY) Patient aunt is at bedside. BP better controlled. Pt still complains of difficulty sleep at night due to OSA. PT recommended CIR and CIR consult placed. Currently CIR pending placement.   OBJECTIVE Temp:  [98.1 F (36.7 C)-98.4 F (36.9 C)] 98.2 F (36.8 C) (07/19 0956) Pulse Rate:  [63-73] 72 (07/19 0956) Cardiac Rhythm:  [-] Normal sinus rhythm (07/19 0730) Resp:  [20-22] 22 (07/19 0956) BP: (134-153)/(71-84) 148/78 mmHg (07/19 0956) SpO2:  [98 %-100 %] 100 % (07/19 0956)  CBC:   Recent Labs Lab 10/06/15 1812  10/10/15 0352 10/11/15 0357  WBC 12.7*  < > 8.1 8.0  NEUTROABS 10.5*  --   --   --   HGB 10.9*  < > 9.5* 9.1*  HCT 37.2  < > 33.7* 32.6*  MCV 72.7*  < > 75.2* 75.1*  PLT 262  < > 250 266  < > = values in this interval not displayed.  Basic Metabolic Panel:   Recent Labs Lab 10/10/15 0352 10/11/15 0357  NA 140 139  K 4.2 4.1  CL 104 102  CO2 29 32  GLUCOSE 91 104*  BUN 14 14  CREATININE 1.00 0.94  CALCIUM 9.3 9.3    Lipid Panel:     Component Value Date/Time   CHOL 112 10/08/2015 0541   TRIG 62 10/08/2015 0541   HDL 42 10/08/2015 0541   CHOLHDL 2.7 10/08/2015 0541   VLDL 12 10/08/2015 0541   LDLCALC 58 10/08/2015 0541   HgbA1c:  Lab Results  Component Value Date   HGBA1C 5.7* 10/07/2015   Urine Drug Screen: No results found for: LABOPIA, COCAINSCRNUR, LABBENZ, AMPHETMU, THCU, LABBARB    IMAGING I have personally reviewed the radiological images below and agree with the radiology interpretations.  Ct Head Wo Contrast 10/07/2015   1. Similar size of parenchymal hemorrhage centered at the posterior right temporal lobe, estimated volume 2.2 cc. Minimal localized edema without significant mass effect.  2. Moderate chronic microvascular ischemic disease.   10/06/2015   1. Examination is positive for a hyperdense mass within the right posterior temporoparietal lobe. In the acute setting  finding is concerning for a focal hemorrhagic infarct.  2. Mild chronic small vessel ischemic change.  10/08/2015  IMPRESSION: 2 x 1.9 x 1.3 cm (volume = 2.6 cc) posterior right temporal lobe hemorrhage without significant change from prior examination (when utilizing similar landmarks). The degree of surrounding vasogenic edema is minimally more prominent. No new intracranial hemorrhage.   Ct Angio Chest Pe W/cm &/or Wo Cm 10/06/2015   No CT evidence of pulmonary embolism. Cardiomegaly with mild edema or congestive changes. No focal consolidation.   Carotid Ultrasound 10/07/2015 No significant extracranial carotid artery stenosis demonstrated. Right vertebral artery was not clearly visualized. Left vertebral artery demonstrated antegrade flow.  LTM EEG Diffuse slowing - background slowing and diffuse rhythmic delta activity. No electrographic seizures were seen.  TTE  - Left ventricle: The cavity size was normal. There was moderate concentric hypertrophy. Systolic function was normal. The estimated ejection fraction was in the range of 60% to 65%. Wall motion was normal; there were no regional wall motion abnormalities. Features are consistent with a pseudonormal left ventricular filling pattern, with concomitant abnormal relaxation and increased filling pressure (grade 2 diastolic dysfunction). Doppler parameters are consistent with elevated ventricular end-diastolic filling pressure. - Aortic valve: Trileaflet; mildly thickened, mildly calcified leaflets. There was no regurgitation. - Aortic root: The aortic root was  normal in size. - Mitral valve: Structurally normal valve. There was trivial regurgitation. - Left atrium: The atrium was normal in size. - Right ventricle: The cavity size was normal. Wall thickness was normal. Systolic function was normal. - Right atrium: The atrium was normal in size. - Tricuspid valve: There was trivial regurgitation. - Pulmonic valve: There was no  regurgitation. - Pulmonary arteries: Systolic pressure was within the normal range. - Inferior vena cava: The vessel was dilated. The respirophasic diameter changes were blunted (< 50%), consistent with elevated central venous pressure. - Pericardium, extracardiac: There was no pericardial effusion.  LE venous doppler  No evidence of DVT, superficial thrombosis, or Baker's Cyst.  CTA head and neck  10/09/2015 1. Negative for dural venous sinus thrombosis. The bilateral transverse and sigmoid sinuses are diminutive but patent. Both IJ bulbs are patent. Mildly prominent right jugular foramen pars nervosa incidentally noted.  2. Negative arterial findings on CTA head and neck aside from vessel tortuosity. Retropharyngeal course of both carotid arteries. 3. Stable appearance of posterior right hemisphere intra-axial hemorrhage since 10/06/2015. Mild surrounding edema with no significant mass effect. No associated cortical vein thrombosis is identified. No evidence of associated AVM. 4. Cardiomegaly re-demonstrated. Central pulmonary artery  enlargement may indicate a degree of pulmonary artery hypertension. Increased pulmonary septal thickening in the lung apices may indicate mild interstitial edema.  Mr Brain Wo Contrast  10/10/2015  IMPRESSION: 1. Stable size and appearance of posterior right temporal intraparenchymal hemorrhage with mild localized edema without significant mass effect. 2. Few additional scattered foci susceptibility artifact within the supratentorial and infratentorial brain, most consistent with small chronic micro hemorrhages. Underlying hypertensive etiology is favored. 3. Moderate chronic microvascular ischemic disease. 4. Empty sella.    PHYSICAL EXAM  Temp:  [98.1 F (36.7 C)-98.4 F (36.9 C)] 98.2 F (36.8 C) (07/19 0956) Pulse Rate:  [63-73] 72 (07/19 0956) Resp:  [20-22] 22 (07/19 0956) BP: (134-153)/(71-84) 148/78 mmHg (07/19 0956) SpO2:  [98 %-100 %] 100 % (07/19  0956)  General - morbid obesity, well developed, in no apparent distress.  Ophthalmologic - Fundi not visualized due to eye movement.  Cardiovascular - Regular rate and rhythm.  Mental Status -  Level of arousal and orientation to time, place, and person were intact. Language including expression, naming, repetition, comprehension was assessed and found intact.  Cranial Nerves II - XII - II - Visual field intact OU. III, IV, VI - Extraocular movements intact. V - Facial sensation intact bilaterally. VII - Facial movement intact bilaterally. VIII - Hearing & vestibular intact bilaterally. X - Palate elevates symmetrically. XI - Chin turning & shoulder shrug intact bilaterally. XII - Tongue protrusion intact.  Motor Strength - The patient's strength was normal in all extremities and pronator drift was absent.  Bulk was normal and fasciculations were absent.   Motor Tone - Muscle tone was assessed at the neck and appendages and was normal.  Reflexes - The patient's reflexes were 1+ in all extremities and she had no pathological reflexes.  Sensory - Light touch, temperature/pinprick were assessed and were symmetrical.    Coordination - The patient had normal movements in the hands with no ataxia or dysmetria.  Tremor was absent.  Gait and Station - not tested.   ASSESSMENT/PLAN Ms. ARORA STOUGH is a 56 y.o. female with history of hypertension presenting with right temporal ICH and seizure. She did not receive IV t-PA due to [place].   ICH - right temporal small ICH, likely  related to untreated HTN  MRI - right temporal stable ICH and no hemorrhagic infarct  Repeat CT x 3 - stable right temporal small ICH  CTA and CTV neg for AVM or venous sinus thrombosis. Stable hemorrhage  Carotid Doppler - Right VA not clearly visualized.  2D Echo  EF 60-65%. No source of embolus   LE venous doppler negative for DVT  LTM EEG no seizure   LDL 58   HgbA1c 5.7  SCDs and heparin subq  for VTE prophylaxis Diet Carb Modified Fluid consistency:: Thin; Room service appropriate?: Yes  No antithrombotic prior to admission  Ongoing aggressive stroke risk factor management  Therapy recommendations:  CIR   Disposition:  pending  Hypertension  Stable now Goal SBP< 160 On amlodipine 10mg , hygroton 25mg , clonidine 0.2mg  tid and metoprolol 25mg  bid  Appreciate cardiology recs  Other Stroke Risk Factors  Morbid obesity, Body mass index is 53.76 kg/(m^2)., recommend weight loss, diet and exercise as appropriate   Positive Family hx stroke   Obstructive sleep apnea - need outpt evaluation  Other Active Problems  Hypokalemia resolved.  Microcytic anemia:  Iron and TIBC - low - Will not add iron at this time as contraindicated with acute [facility] day # 5   Rosalin Hawking, MD PhD Stroke Neurology 10/11/2015 6:59 PM     To contact Stroke Continuity provider, please refer to [email or web address] After hours, contact General Neurology

## 2015-10-11 NOTE — Progress Notes (Signed)
Subjective:  Sitting up in chair outside of bed and no complaints.  No shortness of breath or chest pain.  Objective:  Vital Signs in the last 24 hours: BP 134/75 mmHg  Pulse 73  Temp(Src) 98.4 F (36.9 C) (Axillary)  Resp 20  Ht 4\' 11"  (1.499 m)  Wt 120.8 kg (266 lb 5.1 oz)  BMI 53.76 kg/m2  SpO2 100%  LMP 02/17/2011  Physical Exam: Morbidly obese black female in no acute distress, Lungs:  Reduced breath sounds Cardiac:  Regular rhythm, normal S1 and S2, no S3 Extremities:  No edema present  Intake/Output from previous day:    Weight Filed Weights   10/06/15 1706 10/06/15 2345  Weight: 113.399 kg (250 lb) 120.8 kg (266 lb 5.1 oz)    Lab Results: Basic Metabolic Panel:  Recent Labs  10/10/15 0352 10/11/15 0357  NA 140 139  K 4.2 4.1  CL 104 102  CO2 29 32  GLUCOSE 91 104*  BUN 14 14  CREATININE 1.00 0.94   CBC:  Recent Labs  10/10/15 0352 10/11/15 0357  WBC 8.1 8.0  HGB 9.5* 9.1*  HCT 33.7* 32.6*  MCV 75.2* 75.1*  PLT 250 266   Cardiac Enzymes: Cardiac Panel (last 3 results)  Recent Labs  10/09/15 0317  CKTOTAL 240*    Telemetry: Normal sinus rhythm  Assessment/Plan:  1.  Elevation of troponin that is likely due to intracerebral hemorrhage-I doubt that this is ischemia 2.  Hypertensive heart disease with LVH 3.  Morbid obesity  Recommendations:  Blood pressure is coming under better control at this time.  She will really need to have carbohydrate restriction and weight reduction and also should have a social work consult to arrange primary care follow-up prior to discharge.     Kerry Hough  MD [facility] Cardiology  10/11/2015, 8:48 AM

## 2015-10-11 NOTE — Consult Note (Signed)
Physical Medicine and Rehabilitation Consult Reason for Consult: Right temporal ICH with seizure Referring Physician: Dr.Xu   HPI: [name] is a 55 y.o. right handed female with history of hypertension. Per chart review patient lives with spouse and family. One level home with several entry steps. 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 Admitted 10/06/2015 with loss of consciousness and generalized seizure. Cranial CT scan showed acute right posterior parietal intracerebral hemorrhage measuring 19 x 18 x 13 mm without significant mass effect. CT of the chest with no pulmonary emboli. CT angiogram head and neck negative for dural venous sinus thrombosis. Negative arterial findings. Echocardiogram with ejection fraction 123456 grade 2 diastolic dysfunction. Elevated troponin 0.09 with cardiology services consulted at this time recommended conservative management. Follow-up MRI of the brain 10/10/2015 stable appearance. EEG showed no seizure activity. Nonspecific diffuse cerebral dysfunction. Maintained on Keppra for seizure prophylaxis. Subcutaneous heparin added for DVT prophylaxis 10/09/2015. Tolerating a regular diet.  Review of Systems  Constitutional: Negative for fever and chills.  HENT: Negative for hearing loss.   Eyes: Negative for blurred vision and double vision.  Respiratory: Negative for cough and shortness of breath.   Cardiovascular: Negative for chest pain and palpitations.  Gastrointestinal: Positive for constipation. Negative for nausea and vomiting.  Musculoskeletal: Positive for joint pain.  Neurological: Positive for seizures and headaches.  All other systems reviewed and are negative.  Past Medical History  Diagnosis Date  . Hypertension    Past Surgical History  Procedure Laterality Date  . Cardiac catheterization     No pertinent family history. Social History:  reports that she has never smoked. She does not have any smokeless  tobacco history on file. She reports that she does not drink alcohol or use illicit drugs. Allergies: No Known Allergies Medications Prior to Admission  Medication Sig Dispense Refill  . fluticasone (FLONASE) 50 MCG/ACT nasal spray Place 1 spray into both nostrils daily as needed for allergies or rhinitis.    . predniSONE (DELTASONE) 10 MG tablet Take 5 tablets (50 mg total) by mouth daily. (Patient not taking: Reported on 10/06/2015) 20 tablet 0    Home: Home Living Family/patient expects to be discharged to:: Private residence Living Arrangements: Spouse/significant other, Other relatives (brother and sister in Sports coach) Available Help at Discharge: Family, Available 24 hours/day Type of Home: House Home Access: Stairs to enter [employer] of Steps: several Entrance Stairs-Rails: Right, Left Home Layout: One level Bathroom Shower/Tub: Chiropodist: Standard  Functional History: Prior Function Level of Independence: Independent Comments: working as Industrial/product designer Status:  Mobility: Bed Mobility Overal bed mobility: Needs Assistance Bed Mobility: Supine to Sit Supine to sit: Min assist General bed mobility comments: in recliner Transfers Overall transfer level: Needs assistance Equipment used: Rolling walker (2 wheeled) Transfers: Sit to/from Stand Sit to Stand: Min assist, +2 safety/equipment General transfer comment: performed from chair x3 and BSC x2, Multi modal cues for hand placement and safety, assist to power up to standing and stabilize Ambulation/Gait Ambulation/Gait assistance: Min assist, +2 safety/equipment (Moderate assist upon reaching 20 ft due to dizziness ) Ambulation Distance (Feet): 20 Feet Assistive device: Rolling walker (2 wheeled) Gait Pattern/deviations: Step-through pattern, Decreased stride length, Trunk flexed, Wide base of support General Gait Details: significant dizziness reported in standing (required  chair follow) Gait velocity: slow Gait velocity interpretation: Below normal speed for age/gender    ADL: ADL Overall ADL's : Needs assistance/impaired Eating/Feeding: Modified independent  Grooming: Set up, Sitting, Supervision/safety Upper Body Bathing: Set up, Supervision/ safety, Sitting Lower Body Bathing: Moderate assistance, Sit to/from stand Upper Body Dressing : Minimal assistance, Sitting Upper Body Dressing Details (indicate cue type and reason): pt falling posteriorly due to "dizziness" Lower Body Dressing: Moderate assistance, Sit to/from stand Toilet Transfer: +2 for safety/equipment, Minimal assistance, RW, BSC Toileting- Clothing Manipulation and Hygiene: Moderate assistance, Sit to/from stand Functional mobility during ADLs: Minimal assistance, Rolling walker, Cueing for safety, Cueing for sequencing, +2 for safety/equipment General ADL Comments: limited at times by dizziness  Cognition: Cognition Overall Cognitive Status:  (need to further assess) Orientation Level: Oriented X4 Cognition Arousal/Alertness: Lethargic Behavior During Therapy: Flat affect Overall Cognitive Status:  (need to further assess) Difficult to assess due to: Level of arousal  Blood pressure 148/78, pulse 72, temperature 98.2 F (36.8 C), temperature source Axillary, resp. rate 22, height 4\' 11"  (1.499 m), weight 120.8 kg (266 lb 5.1 oz), last menstrual period 02/17/2011, SpO2 100 %. Physical Exam  Vitals reviewed. Constitutional: She is oriented to person, place, and time. She appears well-developed.  86 -year-old obese female  HENT:  Head: Normocephalic and atraumatic.  Eyes: EOM are normal. Right eye exhibits no discharge. Left eye exhibits no discharge.  Neck: Normal range of motion. Neck supple. No thyromegaly present.  Cardiovascular: Normal rate and regular rhythm.   Respiratory: Effort normal and breath sounds normal. No respiratory distress.  GI: Soft. Bowel sounds are normal.  She exhibits no distension.  Musculoskeletal: She exhibits edema. She exhibits no tenderness.  Neurological: She is oriented to person, place, and time.  Follows full commands. Good awareness of deficits Lethargic after receiving medications Sensation intact to light touch DTRs appear symmetric, although obscured due to habitus +Dysarthria Motor: 4+-5/5 throughout Neg dysmetria/ataxia Decreased [place] b/l UE  Skin: Skin is warm and dry.  Psychiatric: She has a normal mood and affect. Her behavior is normal.    Results for orders placed or performed during the hospital encounter of 10/06/15 (from the past 24 hour(s))  Glucose, capillary     Status: Abnormal   Collection Time: 10/10/15  4:24 PM  Result Value Ref Range   Glucose-Capillary 115 (H) 65 - 99 mg/dL  Glucose, capillary     Status: Abnormal   Collection Time: 10/10/15  9:09 PM  Result Value Ref Range   Glucose-Capillary 118 (H) 65 - 99 mg/dL   Comment 1 Notify RN    Comment 2 Document in Chart   CBC     Status: Abnormal   Collection Time: 10/11/15  3:57 AM  Result Value Ref Range   WBC 8.0 4.0 - 10.5 K/uL   RBC 4.34 3.87 - 5.11 MIL/uL   Hemoglobin 9.1 (L) 12.0 - 15.0 g/dL   HCT 32.6 (L) 36.0 - 46.0 %   MCV 75.1 (L) 78.0 - 100.0 fL   MCH 21.0 (L) 26.0 - 34.0 pg   MCHC 27.9 (L) 30.0 - 36.0 g/dL   RDW 17.9 (H) 11.5 - 15.5 %   Platelets 266 150 - 400 K/uL  Basic metabolic panel     Status: Abnormal   Collection Time: 10/11/15  3:57 AM  Result Value Ref Range   Sodium 139 135 - 145 mmol/L   Potassium 4.1 3.5 - 5.1 mmol/L   Chloride 102 101 - 111 mmol/L   CO2 32 22 - 32 mmol/L   Glucose, Bld 104 (H) 65 - 99 mg/dL   BUN 14 6 - 20 mg/dL  Creatinine, Ser 0.94 0.44 - 1.00 mg/dL   Calcium 9.3 8.9 - 10.3 mg/dL   GFR calc non Af Amer >60 >60 mL/min   GFR calc Af Amer >60 >60 mL/min   Anion gap 5 5 - 15  Glucose, capillary     Status: Abnormal   Collection Time: 10/11/15  6:07 AM  Result Value Ref Range    Glucose-Capillary 103 (H) 65 - 99 mg/dL   Comment 1 Notify RN    Comment 2 Document in Chart   Glucose, capillary     Status: Abnormal   Collection Time: 10/11/15 11:27 AM  Result Value Ref Range   Glucose-Capillary 131 (H) 65 - 99 mg/dL   Mr Brain Wo Contrast  10/10/2015  CLINICAL DATA:  Initial evaluation for acute intracranial hemorrhage. EXAM: MRI HEAD WITHOUT CONTRAST TECHNIQUE: Multiplanar, multiecho pulse sequences of the brain and surrounding structures were obtained without intravenous contrast. COMPARISON:  Multiple prior CTs from 10/09/2015 as well as earlier. FINDINGS: Cerebral volume within normal limits for patient age. Patchy and confluent T2/FLAIR hyperintensity within the periventricular and deep white matter both cerebral hemispheres most consistent with moderate chronic small vessel ischemic disease. Small vessel type changes present within the pons as well. Previously identified posterior right temporal lobe hemorrhage again seen, measuring 20 x 18 x 15 mm on today's study, not appreciably changed from prior. Internal layering hematocrit level noted. Mild localized surrounding edema without significant mass effect. No evidence for adjacent cortical vein thrombosis on SWI sequence. Major dural sinuses are grossly patent. No underlying mass lesion identified on this noncontrast examination. No abnormal foci of restricted diffusion to suggest acute intracranial infarct. Gray-white matter differentiation maintained. Major intracranial vascular flow voids are preserved. Multiple additional subcentimeter foci susceptibility artifact are seen involving both cerebral hemispheres, left greater than right. These are somewhat peripherally located. Few small foci noted within the bilateral cerebellar hemispheres as well. Findings favored to reflect small chronic micro hemorrhages. Underlying hypertension is suspected, although amyloid angiopathy could also be considered. No mass lesion or midline  shift. No hydrocephalus. No extra-axial fluid collection. Craniocervical junction normal. Scattered multilevel degenerative spondylolysis noted within the visualized upper cervical spine without significant stenosis. Incidental note made of an empty sella. No acute abnormality about the globes and orbits. Paranasal sinuses are clear. No mastoid effusion. Inner ear structures grossly unremarkable. Diffusely decreased bone marrow signal intensity on T1 weighted sequence, nonspecific, but may be related to underlying obesity. Scalp soft tissues demonstrate no acute abnormality. IMPRESSION: 1. Stable size and appearance of posterior right temporal intraparenchymal hemorrhage with mild localized edema without significant mass effect. 2. Few additional scattered foci susceptibility artifact within the supratentorial and infratentorial brain, most consistent with small chronic micro hemorrhages. Underlying hypertensive etiology is favored. 3. Moderate chronic microvascular ischemic disease. 4. Empty sella. Electronically Signed   By: Jeannine Boga M.D.   On: 10/10/2015 07:08    Assessment/Plan: Diagnosis: Right temporal ICH with seizure Labs and images independently reviewed.  Records reviewed and summated above. Stroke: Continue secondary stroke prophylaxis and Risk Factor Modification listed below:   Antiplatelet therapy:   Blood Pressure Management:  Continue current medication with prn's with permisive HTN per primary team Statin Agent:   Prediabetes management:    1. Does the need for close, 24 hr/day medical supervision in concert with the patient's rehab needs make it unreasonable for this patient to be served in a less intensive setting? Yes  2. Co-Morbidities requiring supervision/potential complications: HTN (monitor and  provide prns in accordance with increased physical exertion and pain), grade 2 diastolic dysfunction (monitor for fluid overload), tachypnea (monitor RR and O2 Sats with  increased physical exertion), AKI (avoid nephrotoxic meds), ABLA (transfuse if necessary to ensure appropriate perfusion for increased activity tolerance) 3. Due to safety, disease management, medication administration and patient education, does the patient require 24 hr/day rehab nursing? Yes 4. Does the patient require coordinated care of a physician, rehab nurse, PT (1-2 hrs/day, 5 days/week) and OT (1-2 hrs/day, 5 days/week) to address physical and functional deficits in the context of the above medical diagnosis(es)? Yes Addressing deficits in the following areas: balance, endurance, locomotion, strength, transferring, toileting and psychosocial support 5. Can the patient actively participate in an intensive therapy program of at least 3 hrs of therapy per day at least 5 days per week? Yes 6. The potential for patient to make measurable gains while on inpatient rehab is excellent 7. Anticipated functional outcomes upon discharge from inpatient rehab are modified independent and supervision  with PT, modified independent and supervision with OT, n/a with SLP. 8. Estimated rehab length of stay to reach the above functional goals is: 12-16 days. 9. Does the patient have adequate social supports and living environment to accommodate these discharge functional goals? Yes 10. Anticipated D/C setting: Home 11. Anticipated post D/C treatments: HH therapy and Home excercise program 12. Overall Rehab/Functional Prognosis: good  RECOMMENDATIONS: This patient's condition is appropriate for continued rehabilitative care in the following setting: CIR Patient has agreed to participate in recommended program. Yes Note that insurance prior authorization may be required for reimbursement for recommended care.  Comment: Rehab Admissions Coordinator to follow up.  Delice Lesch, MD 10/11/2015

## 2015-10-12 ENCOUNTER — Inpatient Hospital Stay (HOSPITAL_COMMUNITY)
Admission: RE | Admit: 2015-10-12 | Discharge: 2015-10-21 | DRG: 091 | Disposition: A | Discharge status: Home Health | Payer: Medicaid Other | Source: Intra-hospital | Attending: Physical Medicine & Rehabilitation | Admitting: Physical Medicine & Rehabilitation

## 2015-10-12 ENCOUNTER — Encounter (HOSPITAL_COMMUNITY): Payer: Self-pay | Admitting: Physical Medicine and Rehabilitation

## 2015-10-12 DIAGNOSIS — R32 Unspecified urinary incontinence: Secondary | ICD-10-CM | POA: Diagnosis not present

## 2015-10-12 DIAGNOSIS — E119 Type 2 diabetes mellitus without complications: Secondary | ICD-10-CM | POA: Insufficient documentation

## 2015-10-12 DIAGNOSIS — E876 Hypokalemia: Secondary | ICD-10-CM | POA: Diagnosis present

## 2015-10-12 DIAGNOSIS — D509 Iron deficiency anemia, unspecified: Secondary | ICD-10-CM | POA: Diagnosis present

## 2015-10-12 DIAGNOSIS — I612 Nontraumatic intracerebral hemorrhage in hemisphere, unspecified: Secondary | ICD-10-CM

## 2015-10-12 DIAGNOSIS — R29700 NIHSS score 0: Secondary | ICD-10-CM | POA: Diagnosis present

## 2015-10-12 DIAGNOSIS — Z6841 Body Mass Index (BMI) 40.0 and over, adult: Secondary | ICD-10-CM

## 2015-10-12 DIAGNOSIS — Z8249 Family history of ischemic heart disease and other diseases of the circulatory system: Secondary | ICD-10-CM

## 2015-10-12 DIAGNOSIS — E669 Obesity, unspecified: Secondary | ICD-10-CM | POA: Insufficient documentation

## 2015-10-12 DIAGNOSIS — R2689 Other abnormalities of gait and mobility: Secondary | ICD-10-CM | POA: Diagnosis present

## 2015-10-12 DIAGNOSIS — Z833 Family history of diabetes mellitus: Secondary | ICD-10-CM

## 2015-10-12 DIAGNOSIS — M25569 Pain in unspecified knee: Secondary | ICD-10-CM | POA: Diagnosis present

## 2015-10-12 DIAGNOSIS — I951 Orthostatic hypotension: Secondary | ICD-10-CM | POA: Diagnosis present

## 2015-10-12 DIAGNOSIS — I10 Essential (primary) hypertension: Secondary | ICD-10-CM | POA: Diagnosis present

## 2015-10-12 DIAGNOSIS — E1169 Type 2 diabetes mellitus with other specified complication: Secondary | ICD-10-CM | POA: Insufficient documentation

## 2015-10-12 DIAGNOSIS — F4321 Adjustment disorder with depressed mood: Secondary | ICD-10-CM | POA: Diagnosis present

## 2015-10-12 DIAGNOSIS — R7303 Prediabetes: Secondary | ICD-10-CM | POA: Diagnosis present

## 2015-10-12 DIAGNOSIS — R569 Unspecified convulsions: Secondary | ICD-10-CM | POA: Diagnosis present

## 2015-10-12 DIAGNOSIS — I619 Nontraumatic intracerebral hemorrhage, unspecified: Secondary | ICD-10-CM | POA: Diagnosis present

## 2015-10-12 DIAGNOSIS — Z9981 Dependence on supplemental oxygen: Secondary | ICD-10-CM

## 2015-10-12 DIAGNOSIS — R262 Difficulty in walking, not elsewhere classified: Secondary | ICD-10-CM | POA: Diagnosis present

## 2015-10-12 LAB — CBC
HEMATOCRIT: 33.8 % — AB (ref 36.0–46.0)
Hemoglobin: 9.5 g/dL — ABNORMAL LOW (ref 12.0–15.0)
MCH: 20.9 pg — ABNORMAL LOW (ref 26.0–34.0)
MCHC: 28.1 g/dL — ABNORMAL LOW (ref 30.0–36.0)
MCV: 74.4 fL — AB (ref 78.0–100.0)
Platelets: 290 10*3/uL (ref 150–400)
RBC: 4.54 MIL/uL (ref 3.87–5.11)
RDW: 17.8 % — AB (ref 11.5–15.5)
WBC: 6.4 10*3/uL (ref 4.0–10.5)

## 2015-10-12 LAB — GLUCOSE, CAPILLARY
GLUCOSE-CAPILLARY: 116 mg/dL — AB (ref 65–99)
GLUCOSE-CAPILLARY: 97 mg/dL (ref 65–99)
GLUCOSE-CAPILLARY: 112 mg/dL — AB (ref 65–99)
Glucose-Capillary: 113 mg/dL — ABNORMAL HIGH (ref 65–99)

## 2015-10-12 LAB — BASIC METABOLIC PANEL
ANION GAP: 4 — AB (ref 5–15)
BUN: 15 mg/dL (ref 6–20)
CALCIUM: 9.6 mg/dL (ref 8.9–10.3)
CO2: 32 mmol/L (ref 22–32)
Chloride: 103 mmol/L (ref 101–111)
Creatinine, Ser: 0.97 mg/dL (ref 0.44–1.00)
GLUCOSE: 102 mg/dL — AB (ref 65–99)
POTASSIUM: 4.4 mmol/L (ref 3.5–5.1)
Sodium: 139 mmol/L (ref 135–145)

## 2015-10-12 MED ORDER — TRAZODONE HCL 50 MG PO TABS: 25.0000 mg | ORAL_TABLET | Freq: Every evening | ORAL | Status: DC | PRN

## 2015-10-12 MED ORDER — POLYSACCHARIDE IRON COMPLEX 150 MG PO CAPS
150.0000 mg | ORAL_CAPSULE | Freq: Two times a day (BID) | ORAL | Status: DC
  Administered 2015-10-13 – 2015-10-20 (×16): 150 mg via ORAL
  Filled 2015-10-12 (×16): qty 1

## 2015-10-12 MED ORDER — CLONIDINE HCL 0.1 MG PO TABS
0.2000 mg | ORAL_TABLET | Freq: Two times a day (BID) | ORAL | Status: DC
  Administered 2015-10-12: 0.2 mg via ORAL
  Filled 2015-10-12: qty 2

## 2015-10-12 MED ORDER — INSULIN ASPART 100 UNIT/ML ~~LOC~~ SOLN
0.0000 [IU] | Freq: Three times a day (TID) | SUBCUTANEOUS | Status: DC
  Administered 2015-10-16: 1 [IU] via SUBCUTANEOUS

## 2015-10-12 MED ORDER — HYPROMELLOSE (GONIOSCOPIC) 2.5 % OP SOLN
2.0000 [drp] | OPHTHALMIC | Status: DC | PRN
  Filled 2015-10-12: qty 15

## 2015-10-12 MED ORDER — NITROGLYCERIN 0.4 MG SL SUBL: 0.4000 mg | SUBLINGUAL_TABLET | SUBLINGUAL | Status: DC | PRN

## 2015-10-12 MED ORDER — CETYLPYRIDINIUM CHLORIDE 0.05 % MT LIQD
7.0000 mL | Freq: Two times a day (BID) | OROMUCOSAL | Status: DC
  Administered 2015-10-13 – 2015-10-21 (×15): 7 mL via OROMUCOSAL

## 2015-10-12 MED ORDER — ONDANSETRON HCL 4 MG/2ML IJ SOLN: 4.0000 mg | Freq: Four times a day (QID) | INTRAMUSCULAR | Status: DC | PRN

## 2015-10-12 MED ORDER — FLUTICASONE PROPIONATE 50 MCG/ACT NA SUSP
1.0000 | Freq: Every day | NASAL | Status: DC
  Administered 2015-10-13 – 2015-10-21 (×9): 1 via NASAL
  Filled 2015-10-12: qty 16

## 2015-10-12 MED ORDER — ACETAMINOPHEN 325 MG PO TABS
325.0000 mg | ORAL_TABLET | ORAL | Status: DC | PRN
  Administered 2015-10-13 – 2015-10-18 (×4): 650 mg via ORAL
  Filled 2015-10-12 (×4): qty 2

## 2015-10-12 MED ORDER — AMLODIPINE BESYLATE 10 MG PO TABS
10.0000 mg | ORAL_TABLET | Freq: Every day | ORAL | Status: DC
  Administered 2015-10-13 – 2015-10-21 (×9): 10 mg via ORAL
  Filled 2015-10-12 (×9): qty 1

## 2015-10-12 MED ORDER — GUAIFENESIN-DM 100-10 MG/5ML PO SYRP: 5.0000 mL | ORAL_SOLUTION | Freq: Four times a day (QID) | ORAL | Status: DC | PRN

## 2015-10-12 MED ORDER — SENNOSIDES-DOCUSATE SODIUM 8.6-50 MG PO TABS: 1.0000 | ORAL_TABLET | Freq: Every evening | ORAL | Status: DC | PRN

## 2015-10-12 MED ORDER — METOPROLOL TARTRATE 50 MG PO TABS
50.0000 mg | ORAL_TABLET | Freq: Two times a day (BID) | ORAL | Status: DC
  Administered 2015-10-12 – 2015-10-21 (×18): 50 mg via ORAL
  Filled 2015-10-12 (×18): qty 1

## 2015-10-12 MED ORDER — FLEET ENEMA 7-19 GM/118ML RE ENEM: 1.0000 | ENEMA | Freq: Once | RECTAL | Status: DC | PRN

## 2015-10-12 MED ORDER — SALINE SPRAY 0.65 % NA SOLN
1.0000 | NASAL | Status: DC | PRN
  Administered 2015-10-14 – 2015-10-19 (×5): 1 via NASAL
  Filled 2015-10-12: qty 44

## 2015-10-12 MED ORDER — ENOXAPARIN SODIUM 40 MG/0.4ML ~~LOC~~ SOLN
40.0000 mg | SUBCUTANEOUS | Status: DC
  Administered 2015-10-13 – 2015-10-20 (×8): 40 mg via SUBCUTANEOUS
  Filled 2015-10-12 (×9): qty 0.4

## 2015-10-12 MED ORDER — ONDANSETRON HCL 4 MG PO TABS: 4.0000 mg | ORAL_TABLET | Freq: Four times a day (QID) | ORAL | Status: DC | PRN

## 2015-10-12 MED ORDER — ALUM & MAG HYDROXIDE-SIMETH 200-200-20 MG/5ML PO SUSP
30.0000 mL | ORAL | Status: DC | PRN
  Administered 2015-10-13 – 2015-10-16 (×2): 30 mL via ORAL
  Filled 2015-10-12 (×2): qty 30

## 2015-10-12 MED ORDER — BISACODYL 10 MG RE SUPP: 10.0000 mg | Freq: Every day | RECTAL | Status: DC | PRN

## 2015-10-12 MED ORDER — LEVETIRACETAM 750 MG PO TABS
750.0000 mg | ORAL_TABLET | Freq: Two times a day (BID) | ORAL | Status: DC
  Administered 2015-10-12 – 2015-10-13 (×2): 750 mg via ORAL
  Filled 2015-10-12 (×3): qty 1

## 2015-10-12 MED ORDER — DIPHENHYDRAMINE HCL 12.5 MG/5ML PO ELIX: 12.5000 mg | ORAL_SOLUTION | Freq: Four times a day (QID) | ORAL | Status: DC | PRN

## 2015-10-12 MED ORDER — CHLORTHALIDONE 25 MG PO TABS
25.0000 mg | ORAL_TABLET | Freq: Every day | ORAL | Status: DC
  Administered 2015-10-13 – 2015-10-21 (×9): 25 mg via ORAL
  Filled 2015-10-12 (×9): qty 1

## 2015-10-12 MED ORDER — CLONIDINE HCL 0.1 MG PO TABS
0.2000 mg | ORAL_TABLET | Freq: Two times a day (BID) | ORAL | Status: DC
  Administered 2015-10-12 – 2015-10-16 (×8): 0.2 mg via ORAL
  Filled 2015-10-12 (×8): qty 2

## 2015-10-12 MED ORDER — METOPROLOL TARTRATE 50 MG PO TABS
50.0000 mg | ORAL_TABLET | Freq: Two times a day (BID) | ORAL | Status: DC
  Administered 2015-10-12: 50 mg via ORAL
  Filled 2015-10-12: qty 1

## 2015-10-12 NOTE — H&P (Signed)
  Physical Medicine and Rehabilitation Admission H&P    Chief Complaint  Patient presents with  . Dizziness and difficulty walking     HPI:  [name] is a 55 year old RH-female with history of morbid obesity, HTN, iron deficiency anemia who was admitted on 10/06/15 with sudden LOC and witnessed seizure activity.  She was confused on awakening and was started on Keppra and cleviprex for treatment.  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 NIHSS 0.  She did complain of chest pain and CTA chest negative for PE. Cardiology consulted due to elevated troponin and felt that elevation likely due to ICH. She had low risk nuclear stress test in 2012 --does get SOB with mild to moderate activity but no prior hx CAD.  Cardiac echo with EF 60-65%, grade 2 diastolic dysfunction and LVH without wall abnormality.  Medications adjusted for management fo hypertensive hear diseaseBLE dopplers negative for DVT. MRI/MRA brain with stable right temporal IPH with mild edema and evidence of chronic small micro hemorrhages. Neurology felt that ICH due to uncontrolled HTN and SBP goal  <160. Family concerned about ongoing issues with sedation but Dr. Xu recommends keeping higher dose Keppra due to patient seizing on lower dose.  Patient with concerns about lethargy due to BP meds and clonidine tapered to help with symptoms.  Therapy initiated and patient limited by dizziness, intermittent nausea and balance deficits. CIR recommended for follow up therapy.   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  HENT: Negative for hearing loss.   Eyes: Positive for blurred vision. Negative for double vision.  Respiratory: Positive for shortness of breath. Negative for cough.   Cardiovascular: Negative for chest pain, palpitations and leg swelling.  Gastrointestinal: Positive for heartburn and diarrhea. Negative for nausea and  vomiting.  Genitourinary: Negative for dysuria.  Skin: Negative for rash.  Neurological: Positive for dizziness and weakness. Negative for speech change and headaches.  Psychiatric/Behavioral: Negative for depression and memory loss. The patient is not nervous/anxious.       Past Medical History  Diagnosis Date  . Hypertension   . Iron deficiency anemia     Past Surgical History  Procedure Laterality Date  . Cardiac catheterization  04/2010    Dr. Harwani    Family History  Problem Relation Age of Onset  . Diabetes Father   . Hypertension Father   . Hypertension Sister       Social History:   Married. Works as a HH CNA. Lives with husband (caregiver for 88 year old mother) and brother with TBI. She reports that she has never smoked. She does not have any smokeless tobacco history on file. She reports that she does not drink alcohol or use illicit drugs.    Allergies: No Known Allergies    Medications Prior to Admission  Medication Sig Dispense Refill  . fluticasone (FLONASE) 50 MCG/ACT nasal spray Place 1 spray into both nostrils daily as needed for allergies or rhinitis.    . predniSONE (DELTASONE) 10 MG tablet Take 5 tablets (50 mg total) by mouth daily. (Patient not taking: Reported on 10/06/2015) 20 tablet 0    Home: Home Living Family/patient expects to be discharged to:: Private residence Living Arrangements: Spouse/significant other, Other relatives (brother and sister in law) Available Help at Discharge: Family, Available 24 hours/day Type of Home: House Home Access: Stairs to enter Entrance Stairs-Number of Steps: several   Entrance Stairs-Rails: Right, Left Home Layout: One level Bathroom Shower/Tub: Tub/shower unit Bathroom Toilet: Standard   Functional History: Prior Function Level of Independence: Independent Comments: working as nursing assistant  Functional Status:  Mobility: Bed Mobility Overal bed mobility: Needs Assistance Bed Mobility:  Supine to Sit Supine to sit: Min assist General bed mobility comments: in recliner Transfers Overall transfer level: Needs assistance Equipment used: Rolling walker (2 wheeled) Transfers: Sit to/from Stand Sit to Stand: Min assist, +2 safety/equipment General transfer comment: performed from chair x3 and BSC x2, Multi modal cues for hand placement and safety, assist to power up to standing and stabilize Ambulation/Gait Ambulation/Gait assistance: Min assist, +2 safety/equipment (Moderate assist upon reaching 20 ft due to dizziness ) Ambulation Distance (Feet): 20 Feet Assistive device: Rolling walker (2 wheeled) Gait Pattern/deviations: Step-through pattern, Decreased stride length, Trunk flexed, Wide base of support General Gait Details: significant dizziness reported in standing (required chair follow) Gait velocity: slow Gait velocity interpretation: Below normal speed for age/gender    ADL: ADL Overall ADL's : Needs assistance/impaired Eating/Feeding: Modified independent Grooming: Set up, Sitting, Supervision/safety Upper Body Bathing: Set up, Supervision/ safety, Sitting Lower Body Bathing: Moderate assistance, Sit to/from stand Upper Body Dressing : Minimal assistance, Sitting Upper Body Dressing Details (indicate cue type and reason): pt falling posteriorly due to "dizziness" Lower Body Dressing: Moderate assistance, Sit to/from stand Toilet Transfer: +2 for safety/equipment, Minimal assistance, RW, BSC Toileting- Clothing Manipulation and Hygiene: Moderate assistance, Sit to/from stand Functional mobility during ADLs: Minimal assistance, Rolling walker, Cueing for safety, Cueing for sequencing, +2 for safety/equipment General ADL Comments: limited at times by dizziness  Cognition: Cognition Overall Cognitive Status:  (need to further assess) Orientation Level: Oriented X4 Cognition Arousal/Alertness: Lethargic Behavior During Therapy: Flat affect Overall Cognitive  Status:  (need to further assess) Difficult to assess due to: Level of arousal  Physical Exam: Blood pressure 136/89, pulse 63, temperature 98.2 F (36.8 C), temperature source Oral, resp. rate 24, height 4' 11" (1.499 m), weight 120.8 kg (266 lb 5.1 oz), last menstrual period 02/17/2011, SpO2 100 %. Physical Exam  Nursing note and vitals reviewed. Constitutional: She is oriented to person, place, and time. She appears well-developed and well-nourished. Nasal cannula in place.  HENT:  Head: Normocephalic and atraumatic.  Mouth/Throat: Oropharynx is clear and moist.  Eyes: Conjunctivae are normal. Pupils are equal, round, and reactive to light.  Neck: Normal range of motion. Neck supple.  Cardiovascular: Normal rate and regular rhythm.   Murmur heard. Respiratory: Effort normal and breath sounds normal. No stridor. No respiratory distress.  GI: Soft. Bowel sounds are normal. She exhibits no distension. There is no tenderness.  Musculoskeletal: She exhibits no edema or tenderness.  Neurological: She is alert and oriented to person, place, and time.  Skin: Skin is warm and dry.  Psychiatric: She has a normal mood and affect. Her behavior is normal. Thought content normal.  Motor strength is 4/5, bilateral deltoid, biceps, triceps, grip, hip flexor, knee extensors, ankle dose of Effexor. Sensation intact to light touch in bilateral upper and lower limbs  Results for orders placed or performed during the hospital encounter of 10/06/15 (from the past 48 hour(s))  Glucose, capillary     Status: Abnormal   Collection Time: 10/10/15  4:24 PM  Result Value Ref Range   Glucose-Capillary 115 (H) 65 - 99 mg/dL  Glucose, capillary     Status: Abnormal   Collection Time: 10/10/15  9:09 PM  Result Value Ref Range     Glucose-Capillary 118 (H) 65 - 99 mg/dL   Comment 1 Notify RN    Comment 2 Document in Chart   CBC     Status: Abnormal   Collection Time: 10/11/15  3:57 AM  Result Value Ref Range     WBC 8.0 4.0 - 10.5 K/uL   RBC 4.34 3.87 - 5.11 MIL/uL   Hemoglobin 9.1 (L) 12.0 - 15.0 g/dL   HCT 32.6 (L) 36.0 - 46.0 %   MCV 75.1 (L) 78.0 - 100.0 fL   MCH 21.0 (L) 26.0 - 34.0 pg   MCHC 27.9 (L) 30.0 - 36.0 g/dL   RDW 17.9 (H) 11.5 - 15.5 %   Platelets 266 150 - 400 K/uL  Basic metabolic panel     Status: Abnormal   Collection Time: 10/11/15  3:57 AM  Result Value Ref Range   Sodium 139 135 - 145 mmol/L   Potassium 4.1 3.5 - 5.1 mmol/L   Chloride 102 101 - 111 mmol/L   CO2 32 22 - 32 mmol/L   Glucose, Bld 104 (H) 65 - 99 mg/dL   BUN 14 6 - 20 mg/dL   Creatinine, Ser 0.94 0.44 - 1.00 mg/dL   Calcium 9.3 8.9 - 10.3 mg/dL   GFR calc non Af Amer >60 >60 mL/min   GFR calc Af Amer >60 >60 mL/min    Comment: (NOTE) The eGFR has been calculated using the CKD EPI equation. This calculation has not been validated in all clinical situations. eGFR's persistently <60 mL/min signify possible Chronic Kidney Disease.    Anion gap 5 5 - 15  Glucose, capillary     Status: Abnormal   Collection Time: 10/11/15  6:07 AM  Result Value Ref Range   Glucose-Capillary 103 (H) 65 - 99 mg/dL   Comment 1 Notify RN    Comment 2 Document in Chart   Glucose, capillary     Status: Abnormal   Collection Time: 10/11/15 11:27 AM  Result Value Ref Range   Glucose-Capillary 131 (H) 65 - 99 mg/dL  Glucose, capillary     Status: Abnormal   Collection Time: 10/11/15  4:45 PM  Result Value Ref Range   Glucose-Capillary 111 (H) 65 - 99 mg/dL  Glucose, capillary     Status: Abnormal   Collection Time: 10/11/15  9:16 PM  Result Value Ref Range   Glucose-Capillary 111 (H) 65 - 99 mg/dL   Comment 1 Notify RN    Comment 2 Document in Chart   CBC     Status: Abnormal   Collection Time: 10/12/15  2:38 AM  Result Value Ref Range   WBC 6.4 4.0 - 10.5 K/uL   RBC 4.54 3.87 - 5.11 MIL/uL   Hemoglobin 9.5 (L) 12.0 - 15.0 g/dL   HCT 33.8 (L) 36.0 - 46.0 %   MCV 74.4 (L) 78.0 - 100.0 fL   MCH 20.9 (L) 26.0 -  34.0 pg   MCHC 28.1 (L) 30.0 - 36.0 g/dL   RDW 17.8 (H) 11.5 - 15.5 %   Platelets 290 150 - 400 K/uL  Basic metabolic panel     Status: Abnormal   Collection Time: 10/12/15  2:38 AM  Result Value Ref Range   Sodium 139 135 - 145 mmol/L   Potassium 4.4 3.5 - 5.1 mmol/L   Chloride 103 101 - 111 mmol/L   CO2 32 22 - 32 mmol/L   Glucose, Bld 102 (H) 65 - 99 mg/dL   BUN 15 6 - 20   mg/dL   Creatinine, Ser 0.97 0.44 - 1.00 mg/dL   Calcium 9.6 8.9 - 10.3 mg/dL   GFR calc non Af Amer >60 >60 mL/min   GFR calc Af Amer >60 >60 mL/min    Comment: (NOTE) The eGFR has been calculated using the CKD EPI equation. This calculation has not been validated in all clinical situations. eGFR's persistently <60 mL/min signify possible Chronic Kidney Disease.    Anion gap 4 (L) 5 - 15  Glucose, capillary     Status: Abnormal   Collection Time: 10/12/15  6:25 AM  Result Value Ref Range   Glucose-Capillary 113 (H) 65 - 99 mg/dL   Comment 1 Notify RN    Comment 2 Document in Chart   Glucose, capillary     Status: Abnormal   Collection Time: 10/12/15 10:35 AM  Result Value Ref Range   Glucose-Capillary 116 (H) 65 - 99 mg/dL   Comment 1 Notify RN    Comment 2 Document in Chart    No results found.     Medical Problem List and Plan: 1.  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  DVT Prophylaxis/Anticoagulation: Pharmaceutical: Lovenox 3. Pain Management: tylenol prn for knee pain.  4. Mood: LCSW to follow for evaluation and support.  5. Neuropsych: This patient is capable of making decisions on her own behalf. 6. Skin/Wound Care:  Routine pressure relief measures 7. Fluids/Electrolytes/Nutrition: Monitor I/O. Check lytes in am.  8. HTN: Monitor bid. Continue catapres, hygroton, and metoprolol 9. Iron deficiency anemia: Add iron supplement.  10. New onset seizures: On Keppra bid.  11. Diarrhea: discontinue laxatives.  12.  Prediabetes/Morbid obesity:  Encourage weight loss. Will consult dietician to help educate on diet.  13. Dizziness:   Check orthostatic vitals.    Post Admission Physician Evaluation: 1. Functional deficits secondary  to right temporal intracranial hemorrhage. 2. Patient is admitted to receive collaborative, interdisciplinary care between the physiatrist, rehab nursing staff, and therapy team. 3. Patient's level of medical complexity and substantial therapy needs in context of that medical necessity cannot be provided at a lesser intensity of care such as a SNF. 4. Patient has experienced substantial functional loss from his/her baseline which was documented above under the "Functional History" and "Functional Status" headings.  Judging by the patient's diagnosis, physical exam, and functional history, the patient has potential for functional progress which will result in measurable gains while on inpatient rehab.  These gains will be of substantial and practical use upon discharge  in facilitating mobility and self-care at the household level. 5. Physiatrist will provide 24 hour management of medical needs as well as oversight of the therapy plan/treatment and provide guidance as appropriate regarding the interaction of the two. 6. 24 hour rehab nursing will assist with bladder management, bowel management, safety, skin/wound care, disease management, medication administration, pain management and patient education  and help integrate therapy concepts, techniques,education, etc. 7. PT will assess and treat for/with: pre gait, gait training, endurance , safety, equipment, neuromuscular re education.   Goals are: Modified independent. 8. OT will assess and treat for/with: ADLs, Cognitive perceptual skills, Neuromuscular re education, safety, endurance, equipment.   Goals are: Mod I. Therapy may proceed with showering this patient. 9. SLP will assess and treat for/with: NA.  Goals are: NA. 10. Case  Management and Social Worker will assess and treat for psychological issues and discharge planning. 11. Team conference will be held weekly to assess progress toward   goals and to determine barriers to discharge. 12. Patient will receive at least 3 hours of therapy per day at least 5 days per week. 13. ELOS: 7-10 days       14. Prognosis:  excellent     Andrew E. Kirsteins M.D. [facility] FAAPM&R (Sports Med, Neuromuscular Med) Diplomate Am Board of Electrodiagnostic Med   10/12/2015 

## 2015-10-12 NOTE — Progress Notes (Signed)
Ankit Lorie Phenix, MD Physician Signed Physical Medicine and Rehabilitation Consult Note 10/11/2015 11:53 AM  Related encounter: ED to Hosp-Admission (Current) from 10/06/2015 in [place] Collapse All        Physical Medicine and Rehabilitation Consult Reason for Consult: Right temporal ICH with seizure Referring Physician: Dr.Xu   HPI: [name] is a 55 y.o. right handed female with history of hypertension. Per chart review patient lives with spouse and family. One level home with several entry steps. 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 Admitted 10/06/2015 with loss of consciousness and generalized seizure. Cranial CT scan showed acute right posterior parietal intracerebral hemorrhage measuring 19 x 18 x 13 mm without significant mass effect. CT of the chest with no pulmonary emboli. CT angiogram head and neck negative for dural venous sinus thrombosis. Negative arterial findings. Echocardiogram with ejection fraction 123456 grade 2 diastolic dysfunction. Elevated troponin 0.09 with cardiology services consulted at this time recommended conservative management. Follow-up MRI of the brain 10/10/2015 stable appearance. EEG showed no seizure activity. Nonspecific diffuse cerebral dysfunction. Maintained on Keppra for seizure prophylaxis. Subcutaneous heparin added for DVT prophylaxis 10/09/2015. Tolerating a regular diet.  Review of Systems  Constitutional: Negative for fever and chills.  HENT: Negative for hearing loss.  Eyes: Negative for blurred vision and double vision.  Respiratory: Negative for cough and shortness of breath.  Cardiovascular: Negative for chest pain and palpitations.  Gastrointestinal: Positive for constipation. Negative for nausea and vomiting.  Musculoskeletal: Positive for joint pain.  Neurological: Positive for seizures and headaches.  All other systems reviewed and are negative.  Past  Medical History  Diagnosis Date  . Hypertension    Past Surgical History  Procedure Laterality Date  . Cardiac catheterization     No pertinent family history. Social History:  reports that she has never smoked. She does not have any smokeless tobacco history on file. She reports that she does not drink alcohol or use illicit drugs. Allergies: No Known Allergies Medications Prior to Admission  Medication Sig Dispense Refill  . fluticasone (FLONASE) 50 MCG/ACT nasal spray Place 1 spray into both nostrils daily as needed for allergies or rhinitis.    . predniSONE (DELTASONE) 10 MG tablet Take 5 tablets (50 mg total) by mouth daily. (Patient not taking: Reported on 10/06/2015) 20 tablet 0    Home: Home Living Family/patient expects to be discharged to:: Private residence Living Arrangements: Spouse/significant other, Other relatives (brother and sister in Sports coach) Available Help at Discharge: Family, Available 24 hours/day Type of Home: House Home Access: Stairs to enter [employer] of Steps: several Entrance Stairs-Rails: Right, Left Home Layout: One level Bathroom Shower/Tub: Chiropodist: Standard  Functional History: Prior Function Level of Independence: Independent Comments: working as Industrial/product designer Status:  Mobility: Bed Mobility Overal bed mobility: Needs Assistance Bed Mobility: Supine to Sit Supine to sit: Min assist General bed mobility comments: in recliner Transfers Overall transfer level: Needs assistance Equipment used: Rolling walker (2 wheeled) Transfers: Sit to/from Stand Sit to Stand: Min assist, +2 safety/equipment General transfer comment: performed from chair x3 and BSC x2, Multi modal cues for hand placement and safety, assist to power up to standing and stabilize Ambulation/Gait Ambulation/Gait assistance: Min assist, +2 safety/equipment (Moderate assist upon reaching 20 ft due to  dizziness ) Ambulation Distance (Feet): 20 Feet Assistive device: Rolling walker (2 wheeled) Gait Pattern/deviations: Step-through pattern, Decreased stride length, Trunk  flexed, Wide base of support General Gait Details: significant dizziness reported in standing (required chair follow) Gait velocity: slow Gait velocity interpretation: Below normal speed for age/gender    ADL: ADL Overall ADL's : Needs assistance/impaired Eating/Feeding: Modified independent Grooming: Set up, Sitting, Supervision/safety Upper Body Bathing: Set up, Supervision/ safety, Sitting Lower Body Bathing: Moderate assistance, Sit to/from stand Upper Body Dressing : Minimal assistance, Sitting Upper Body Dressing Details (indicate cue type and reason): pt falling posteriorly due to "dizziness" Lower Body Dressing: Moderate assistance, Sit to/from stand Toilet Transfer: +2 for safety/equipment, Minimal assistance, RW, BSC Toileting- Clothing Manipulation and Hygiene: Moderate assistance, Sit to/from stand Functional mobility during ADLs: Minimal assistance, Rolling walker, Cueing for safety, Cueing for sequencing, +2 for safety/equipment General ADL Comments: limited at times by dizziness  Cognition: Cognition Overall Cognitive Status: (need to further assess) Orientation Level: Oriented X4 Cognition Arousal/Alertness: Lethargic Behavior During Therapy: Flat affect Overall Cognitive Status: (need to further assess) Difficult to assess due to: Level of arousal  Blood pressure 148/78, pulse 72, temperature 98.2 F (36.8 C), temperature source Axillary, resp. rate 22, height 4\' 11"  (1.499 m), weight 120.8 kg (266 lb 5.1 oz), last menstrual period 02/17/2011, SpO2 100 %. Physical Exam  Vitals reviewed. Constitutional: She is oriented to person, place, and time. She appears well-developed.  32 -year-old obese female  HENT:  Head: Normocephalic and atraumatic.  Eyes: EOM are normal. Right eye exhibits no  discharge. Left eye exhibits no discharge.  Neck: Normal range of motion. Neck supple. No thyromegaly present.  Cardiovascular: Normal rate and regular rhythm.  Respiratory: Effort normal and breath sounds normal. No respiratory distress.  GI: Soft. Bowel sounds are normal. She exhibits no distension.  Musculoskeletal: She exhibits edema. She exhibits no tenderness.  Neurological: She is oriented to person, place, and time.  Follows full commands. Good awareness of deficits Lethargic after receiving medications Sensation intact to light touch DTRs appear symmetric, although obscured due to habitus +Dysarthria Motor: 4+-5/5 throughout Neg dysmetria/ataxia Decreased Brady b/l UE  Skin: Skin is warm and dry.  Psychiatric: She has a normal mood and affect. Her behavior is normal.     Lab Results Last 24 Hours    Results for orders placed or performed during the hospital encounter of 10/06/15 (from the past 24 hour(s))  Glucose, capillary Status: Abnormal   Collection Time: 10/10/15 4:24 PM  Result Value Ref Range   Glucose-Capillary 115 (H) 65 - 99 mg/dL  Glucose, capillary Status: Abnormal   Collection Time: 10/10/15 9:09 PM  Result Value Ref Range   Glucose-Capillary 118 (H) 65 - 99 mg/dL   Comment 1 Notify RN    Comment 2 Document in Chart   CBC Status: Abnormal   Collection Time: 10/11/15 3:57 AM  Result Value Ref Range   WBC 8.0 4.0 - 10.5 K/uL   RBC 4.34 3.87 - 5.11 MIL/uL   Hemoglobin 9.1 (L) 12.0 - 15.0 g/dL   HCT 32.6 (L) 36.0 - 46.0 %   MCV 75.1 (L) 78.0 - 100.0 fL   MCH 21.0 (L) 26.0 - 34.0 pg   MCHC 27.9 (L) 30.0 - 36.0 g/dL   RDW 17.9 (H) 11.5 - 15.5 %   Platelets 266 150 - 400 K/uL  Basic metabolic panel Status: Abnormal   Collection Time: 10/11/15 3:57 AM  Result Value Ref Range   Sodium 139 135 - 145 mmol/L   Potassium 4.1 3.5 - 5.1 mmol/L   Chloride  102 101 - 111 mmol/L  CO2 32 22 - 32 mmol/L   Glucose, Bld 104 (H) 65 - 99 mg/dL   BUN 14 6 - 20 mg/dL   Creatinine, Ser 0.94 0.44 - 1.00 mg/dL   Calcium 9.3 8.9 - 10.3 mg/dL   GFR calc non Af Amer >60 >60 mL/min   GFR calc Af Amer >60 >60 mL/min   Anion gap 5 5 - 15  Glucose, capillary Status: Abnormal   Collection Time: 10/11/15 6:07 AM  Result Value Ref Range   Glucose-Capillary 103 (H) 65 - 99 mg/dL   Comment 1 Notify RN    Comment 2 Document in Chart   Glucose, capillary Status: Abnormal   Collection Time: 10/11/15 11:27 AM  Result Value Ref Range   Glucose-Capillary 131 (H) 65 - 99 mg/dL      Imaging Results (Last 48 hours)    Mr Brain Wo Contrast  10/10/2015 CLINICAL DATA: Initial evaluation for acute intracranial hemorrhage. EXAM: MRI HEAD WITHOUT CONTRAST TECHNIQUE: Multiplanar, multiecho pulse sequences of the brain and surrounding structures were obtained without intravenous contrast. COMPARISON: Multiple prior CTs from 10/09/2015 as well as earlier. FINDINGS: Cerebral volume within normal limits for patient age. Patchy and confluent T2/FLAIR hyperintensity within the periventricular and deep white matter both cerebral hemispheres most consistent with moderate chronic small vessel ischemic disease. Small vessel type changes present within the pons as well. Previously identified posterior right temporal lobe hemorrhage again seen, measuring 20 x 18 x 15 mm on today's study, not appreciably changed from prior. Internal layering hematocrit level noted. Mild localized surrounding edema without significant mass effect. No evidence for adjacent cortical vein thrombosis on SWI sequence. Major dural sinuses are grossly patent. No underlying mass lesion identified on this noncontrast examination. No abnormal foci of restricted diffusion to suggest acute intracranial infarct. Gray-white matter differentiation  maintained. Major intracranial vascular flow voids are preserved. Multiple additional subcentimeter foci susceptibility artifact are seen involving both cerebral hemispheres, left greater than right. These are somewhat peripherally located. Few small foci noted within the bilateral cerebellar hemispheres as well. Findings favored to reflect small chronic micro hemorrhages. Underlying hypertension is suspected, although amyloid angiopathy could also be considered. No mass lesion or midline shift. No hydrocephalus. No extra-axial fluid collection. Craniocervical junction normal. Scattered multilevel degenerative spondylolysis noted within the visualized upper cervical spine without significant stenosis. Incidental note made of an empty sella. No acute abnormality about the globes and orbits. Paranasal sinuses are clear. No mastoid effusion. Inner ear structures grossly unremarkable. Diffusely decreased bone marrow signal intensity on T1 weighted sequence, nonspecific, but may be related to underlying obesity. Scalp soft tissues demonstrate no acute abnormality. IMPRESSION: 1. Stable size and appearance of posterior right temporal intraparenchymal hemorrhage with mild localized edema without significant mass effect. 2. Few additional scattered foci susceptibility artifact within the supratentorial and infratentorial brain, most consistent with small chronic micro hemorrhages. Underlying hypertensive etiology is favored. 3. Moderate chronic microvascular ischemic disease. 4. Empty sella. Electronically Signed By: Jeannine Boga M.D. On: 10/10/2015 07:08     Assessment/Plan: Diagnosis: Right temporal ICH with seizure Labs and images independently reviewed. Records reviewed and summated above. Stroke: Continue secondary stroke prophylaxis and Risk Factor Modification listed below:  Antiplatelet therapy:  Blood Pressure Management: Continue current medication with prn's with permisive HTN per  primary team Statin Agent:  Prediabetes management:   1. Does the need for close, 24 hr/day medical supervision in concert with the patient's rehab needs make it unreasonable for this patient to be served  in a less intensive setting? Yes  2. Co-Morbidities requiring supervision/potential complications: HTN (monitor and provide prns in accordance with increased physical exertion and pain), grade 2 diastolic dysfunction (monitor for fluid overload), tachypnea (monitor RR and O2 Sats with increased physical exertion), AKI (avoid nephrotoxic meds), ABLA (transfuse if necessary to ensure appropriate perfusion for increased activity tolerance) 3. Due to safety, disease management, medication administration and patient education, does the patient require 24 hr/day rehab nursing? Yes 4. Does the patient require coordinated care of a physician, rehab nurse, PT (1-2 hrs/day, 5 days/week) and OT (1-2 hrs/day, 5 days/week) to address physical and functional deficits in the context of the above medical diagnosis(es)? Yes Addressing deficits in the following areas: balance, endurance, locomotion, strength, transferring, toileting and psychosocial support 5. Can the patient actively participate in an intensive therapy program of at least 3 hrs of therapy per day at least 5 days per week? Yes 6. The potential for patient to make measurable gains while on inpatient rehab is excellent 7. Anticipated functional outcomes upon discharge from inpatient rehab are modified independent and supervision with PT, modified independent and supervision with OT, n/a with SLP. 8. Estimated rehab length of stay to reach the above functional goals is: 12-16 days. 9. Does the patient have adequate social supports and living environment to accommodate these discharge functional goals? Yes 10. Anticipated D/C setting: Home 11. Anticipated post D/C treatments: HH therapy and Home excercise program 12. Overall Rehab/Functional  Prognosis: good  RECOMMENDATIONS: This patient's condition is appropriate for continued rehabilitative care in the following setting: CIR Patient has agreed to participate in recommended program. Yes Note that insurance prior authorization may be required for reimbursement for recommended care.  Comment: Rehab Admissions Coordinator to follow up.  Delice Lesch, MD 10/11/2015       Revision History     Date/Time User Provider Type Action   10/11/2015 3:26 PM Ankit Lorie Phenix, MD Physician Sign   10/11/2015 12:06 PM Cathlyn Parsons, PA-C Physician Assistant Pend   View Details Report       Routing History     Date/Time From To Method   10/11/2015 3:26 PM Ankit Lorie Phenix, MD No Pcp Per Patient In Basket

## 2015-10-12 NOTE — Progress Notes (Signed)
Physical Therapy Treatment Patient Details Name: [name] MRN: [id number] DOB: 25-Jun-1960 Today's Date: 10/12/2015    History of Present Illness pt is a 55 y/o female with h/o HTN admitted to ED after sudden loss of consciousness and witnessed generalized seizue activity.  CT scan showing a hemorrhagic infarct in the right posterior temporoparietal lobe.    PT Comments    Patient is progressing toward mobility goals. Min guard/min A for OOB mobility. Dizziness with ambulation but no c/o nausea this session. Current plan remains appropriate.   Follow Up Recommendations  CIR     Equipment Recommendations  Other (comment)    Recommendations for Other Services Rehab consult     Precautions / Restrictions Precautions Precautions: Fall Restrictions Weight Bearing Restrictions: No    Mobility  Bed Mobility               General bed mobility comments: OOB in chair upon arrival  Transfers Overall transfer level: Needs assistance Equipment used: Rolling walker (2 wheeled) Transfers: Sit to/from Stand Sit to Stand: Min assist;+2 safety/equipment         General transfer comment: cues for hand placement; assist to power up into standing; c/o dizziness upon standing that subsided with brief period of standing  Ambulation/Gait Ambulation/Gait assistance: Min assist Ambulation Distance (Feet): 175 Feet Assistive device: Rolling walker (2 wheeled) Gait Pattern/deviations: Step-through pattern;Drifts right/left Gait velocity: slow   General Gait Details: cues for posture and gaze stabilization; c/o intermittent dizziness while ambulating but no nausea this session; several standing rest periods needed; pt with tendency to drift toward R side   Stairs            Wheelchair Mobility    Modified Rankin (Stroke Patients Only) Modified Rankin (Stroke Patients Only) Pre-Morbid Rankin Score: No symptoms Modified Rankin: Moderately severe disability      Balance     Sitting balance-Leahy Scale: Fair       Standing balance-Leahy Scale: Poor Standing balance comment: reliant on RW                    Cognition Arousal/Alertness: Awake/alert Behavior During Therapy: WFL for tasks assessed/performed Overall Cognitive Status: Within Functional Limits for tasks assessed                      Exercises      General Comments General comments (skin integrity, edema, etc.): SpO2 in upper 80s on RA at rest; 2L O2 via nasal canula donned througout session      Pertinent Vitals/Pain Pain Assessment: No/denies pain    Home Living                      Prior Function            PT Goals (current goals can now be found in the care plan section) Acute Rehab PT Goals Patient Stated Goal: to be independent again and go back to work Progress towards PT goals: Progressing toward goals    Frequency  Min 3X/week    PT Plan Current plan remains appropriate    Co-evaluation             End of Session Equipment Utilized During Treatment: Gait belt;Oxygen Activity Tolerance: Patient tolerated treatment well Patient left: in chair;with call bell/phone within reach;with family/visitor present     Time: 1426-1450 PT Time Calculation (min) (ACUTE ONLY): 24 min  Charges:  $Gait Training: 8-22 mins $Therapeutic Activity: 8-22  mins                    G Codes:      Salina April, PTA Pager: [phone]   10/12/2015, 5:12 PM

## 2015-10-12 NOTE — Progress Notes (Signed)
Discharge orders received, Pt for discharge to CIR Family at bedside to assist patient with discharge. Staff bought pt downstairs via wheelchair.

## 2015-10-12 NOTE — Progress Notes (Signed)
Gunnar Fusi Rehab Admission Coordinator Signed Physical Medicine and Rehabilitation PMR Pre-admission 10/12/2015 3:20 PM  Related encounter: ED to Hosp-Admission (Current) from 10/06/2015 in [place] Collapse All   PMR Admission Coordinator Pre-Admission Assessment  Patient: [name] is an 55 y.o., female MRN: [id number] DOB: May 07, 1960 Height: 4\' 11"  (149.9 cm) Weight: 120.8 kg (266 lb 5.1 oz)  Insurance Information HMO: PPO: PCP: IPA: 80/20: OTHER:  PRIMARY: uninsured Policy#: Subscriber:  CM Name: Phone#: Fax#:  Pre-Cert#: Employer:  Benefits: Phone #: Name:  Eff. Date: Deduct: Out of Pocket Max: Life Max:  CIR: SNF:  Outpatient: Co-Pay:  Home Health: Co-Pay:  DME: Co-Pay:  Providers:   Medicaid Application Date: request to initiate 10/12/15 Case Manager:  Disability Application Date: Case Worker:   Emergency Contact Information Contact Information    Name Relation Home Work Mobile   Ludvigsen,Tyrone Spouse [phone]     Robinson,Shelitha Daughter [phone]     Robinson,Latoya Daughter [phone]       Current Medical History  Patient Admitting Diagnosis: Right temporal ICH with seizure  History of Present Illness: [name] is a 55 year old RH-female with history of morbid obesity, HTN, iron deficiency anemia who was admitted on 10/06/15 with sudden LOC and witnessed seizure activity. She was started on Keppra and cleviprex for treatment. 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 NIHSS 0. She did complain of chest pain and CTA chest negative for PE. Cardiology consulted due to elevated troponin and felt that  elevation likely due to [place]. She had low risk nuclear stress test in 2012; does get SOB with mild to moderate activity but no prior history of CAD. Cardiac echo with EF [id number], grade 2 diastolic dysfunction and LVH without wall abnormality. Medications adjusted for management fo hypertensive hear disease BLE dopplers negative for DVT. MRI/MRA brain with stable right temporal IPH with mild edema and evidence of chronic small micro hemorrhages. Neurology felt that [place] due to uncontrolled HTN and SBP goal <160. Family concerned about ongoing issues with sedation but Dr. Erlinda Hong recommends keeping higher dose Keppra due to patient seizing on lower dose. Patient with concerns about lethargy due to BP meds and clonidine taper. Therapy initiated and patient limited by dizziness, intermittent nausea and balance deficits. CIR recommended for follow up therapy.   NIH Total: 0    Past Medical History  Past Medical History  Diagnosis Date  . Hypertension   . Iron deficiency anemia     Family History  family history is not on file.  Prior Rehab/Hospitalizations:  Has the patient had major surgery during 100 days prior to admission? No  Current Medications   Current facility-administered medications:  . acetaminophen (TYLENOL) tablet 650 mg, 650 mg, Oral, Q4H PRN, 650 mg at 10/10/15 1433 **OR** [DISCONTINUED] acetaminophen (TYLENOL) suppository 650 mg, 650 mg, Rectal, Q4H PRN, Wallie Char . amLODipine (NORVASC) tablet 10 mg, 10 mg, Oral, Daily, Jacolyn Reedy, MD, 10 mg at 10/12/15 1046 . antiseptic oral rinse (CPC / CETYLPYRIDINIUM CHLORIDE 0.05%) solution 7 mL, 7 mL, Mouth Rinse, BID, Wallie Char, 7 mL at 10/11/15 1022 . bisacodyl (DULCOLAX) suppository 10 mg, 10 mg, Rectal, Daily PRN, Wallie Char . chlorthalidone (HYGROTON) tablet 25 mg, 25 mg, Oral, Daily, Jacolyn Reedy, MD, 25 mg at 10/12/15 1046 . cloNIDine (CATAPRES) tablet 0.2 mg, 0.2 mg, Oral, BID, Jacolyn Reedy, MD, 0.2 mg at 10/12/15 1046 . fluticasone (FLONASE) 50  MCG/ACT nasal spray 1 spray, 1 spray, Each Nare, Daily, Wallie Char, 1 spray at 10/12/15 1000 . heparin injection 5,000 Units, 5,000 Units, Subcutaneous, Q8H, Rosalin Hawking, MD, 5,000 Units at 10/10/15 2129 . hydroxypropyl methylcellulose / hypromellose (ISOPTO TEARS / GONIOVISC) 2.5 % ophthalmic solution 2 drop, 2 drop, Both Eyes, PRN, Early Chars Rinehuls, PA-C, 2 drop at 10/07/15 1702 . insulin aspart (novoLOG) injection 0-9 Units, 0-9 Units, Subcutaneous, TID WC, David L Rinehuls, PA-C, 1 Units at 10/10/15 0804 . labetalol (NORMODYNE,TRANDATE) injection 10-40 mg, 10-40 mg, Intravenous, Q10 min PRN, Rosalin Hawking, MD, 20 mg at 10/09/15 2236 . levETIRAcetam (KEPPRA) tablet 750 mg, 750 mg, Oral, BID, Catha Gosselin, MD, 750 mg at 10/12/15 1046 . metoprolol (LOPRESSOR) tablet 50 mg, 50 mg, Oral, BID, Jacolyn Reedy, MD, 50 mg at 10/12/15 1046 . nitroGLYCERIN (NITROSTAT) SL tablet 0.4 mg, 0.4 mg, Sublingual, Q5 min PRN, Geronimo Boot, MD, 0.4 mg at 10/06/15 2023 . ondansetron (ZOFRAN) injection 4 mg, 4 mg, Intravenous, Q6H PRN, Catha Gosselin, MD, 4 mg at 10/08/15 0435 . senna-docusate (Senokot-S) tablet 1 tablet, 1 tablet, Oral, BID, Wallie Char, 1 tablet at 10/12/15 1046 . sodium chloride (OCEAN) 0.65 % nasal spray 1 spray, 1 spray, Each Nare, PRN, Wallie Char  Patients Current Diet: Diet Carb Modified Fluid consistency:: Thin; Room service appropriate?: Yes  Precautions / Restrictions Precautions Precautions: Fall Restrictions Weight Bearing Restrictions: No   Has the patient had 2 or more falls or a fall with injury in the past year?Yes patient reports a fall in her bathroom 1-2 months ago that hurt her head and neck, though she never sought medical treatment for it  Prior Activity Level Community (5-7x/wk): Prior to admission patient was in the community daily driving and working as a Programmer, 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 None of the above  Prior Functional Level Prior Function Level of Independence: Independent Comments: working as Psychologist, counselling  Self Care: Did the patient need help bathing, dressing, using the toilet or eating? Independent  Indoor Mobility: Did the patient need assistance with walking from room to room (with or without device)? Independent  Stairs: Did the patient need assistance with internal or external stairs (with or without device)? Independent  Functional Cognition: Did the patient need help planning regular tasks such as shopping or remembering to take medications? Independent  Current Functional Level Cognition  Overall Cognitive Status: (need to further assess) Difficult to assess due to: Level of arousal Orientation Level: Oriented X4   Extremity Assessment (includes Sensation/Coordination)  Upper Extremity Assessment: Generalized weakness (using BUE for functional tasks)  Lower Extremity Assessment: Generalized weakness, Defer to PT evaluation    ADLs  Overall ADL's : Needs assistance/impaired Eating/Feeding: Modified independent Grooming: Set up, Sitting, Supervision/safety Upper Body Bathing: Set up, Supervision/ safety, Sitting Lower Body Bathing: Moderate assistance, Sit to/from stand Upper Body Dressing : Minimal assistance, Sitting Upper Body Dressing Details (indicate cue type and reason): pt falling posteriorly due to "dizziness" Lower Body Dressing: Moderate assistance, Sit to/from stand Toilet Transfer: +2 for safety/equipment, Minimal assistance, RW, BSC Toileting- Clothing Manipulation and Hygiene: Moderate assistance, Sit to/from stand Functional mobility during ADLs: Minimal assistance, Rolling walker, Cueing  for safety, Cueing for sequencing, +2 for safety/equipment General ADL Comments: limited at times by dizziness  Mobility  Overal bed mobility: Needs Assistance Bed Mobility: Supine to Sit Supine to sit: Min assist General bed mobility comments: in recliner    Transfers  Overall transfer level: Needs assistance Equipment used: Rolling walker (2 wheeled) Transfers: Sit to/from Stand Sit to Stand: Min assist, +2 safety/equipment General transfer comment: performed from chair x3 and BSC x2, Multi modal cues for hand placement and safety, assist to power up to standing and stabilize    Ambulation / Gait / Stairs / Wheelchair Mobility  Ambulation/Gait Ambulation/Gait assistance: Min assist, +2 safety/equipment (Moderate assist upon reaching 20 ft due to dizziness ) Ambulation Distance (Feet): 20 Feet Assistive device: Rolling walker (2 wheeled) Gait Pattern/deviations: Step-through pattern, Decreased stride length, Trunk flexed, Wide base of support General Gait Details: significant dizziness reported in standing (required chair follow) Gait velocity: slow Gait velocity interpretation: Below normal speed for age/gender    Posture / Balance Balance Overall balance assessment: Needs assistance Sitting-balance support: Feet supported Sitting balance-Leahy Scale: Fair Standing balance support: During functional activity Standing balance-Leahy Scale: Poor (posterior bias at times) Standing balance comment: reliance on assist and UE support    Special needs/care consideration BiPAP/CPAP: No MD recommended follow up for outpatient sleep study per patient report  CPM: No Continuous Drip IV: No Dialysis: No  Life Vest: No Oxygen: Yes currently on 2L [place], none PTA Special Bed: No Trach Size: No Wound Vac (area): No  Skin: WDL  Bowel mgmt: 10/12/15 Bladder mgmt: intermittent continence, patient reports sensation with new  urgency Diabetic mgmt: [id number]     Previous Home Environment Living Arrangements: Spouse/significant other, Other relatives (brother and sister in law) Available Help at Discharge: Family, Available 24 hours/day Type of Home: House Home Layout: One level Home Access: Stairs to enter Entrance Stairs-Rails: Right, Left Entrance Stairs-Number of Steps: several Bathroom Shower/Tub: Chiropodist: Standard Home Care Services: No  Discharge Living Setting Plans for Discharge Living Setting: Patient's home Type of Home at Discharge: House Discharge Home Layout: One level Discharge Home Access: Stairs to enter Entrance Stairs-Rails: Can reach both Entrance Stairs-Number of Steps: 4 Discharge Bathroom Shower/Tub: Tub/shower unit, Curtain Discharge Bathroom Toilet: Standard Discharge Bathroom Accessibility: Yes How Accessible: Accessible via walker Does the patient have any problems obtaining your medications?: Yes (Describe) (did not seek medical care in the past due to the expense )  Social/Family/Support Systems Patient Roles: Spouse, Partner, Other (Comment) (sibling) Anticipated Caregiver: Khailey Salmond, [phone] Anticipated Caregiver's Contact Information: Larena Sox [phone] Ability/Limitations of Caregiver: husband available 24/7 and able to provide light physical assist  Caregiver Availability: 24/7 Discharge Plan Discussed with Primary Caregiver: No (discussed with patient and sister who is also able to help ) Is Caregiver In Agreement with Plan?: Yes (per report) Does Caregiver/Family have Issues with Lodging/Transportation while Pt is in Rehab?: No  Goals/Additional Needs Patient/Family Goal for Rehab: PT/OT Supervision-Mod I  Expected length of stay: 12-16 days Cultural Considerations: None Dietary Needs: Heart Healthy and Carb Mod  Equipment Needs: TBD Special Service Needs: Development worker, community for disability and Medicaid contacted  7/20 Additional Information: [place] [facility] [employer] are supportive  Pt/Family Agrees to Admission and willing to participate: Yes Program Orientation Provided & Reviewed with Pt/Caregiver Including Roles & Responsibilities: Yes Additional Information Needs: Assist with medical follow up and Rx expenses  Information Needs to be Provided By: Development worker, community and CSW  Decrease burden of Care through IP rehab admission: No  Possible need for SNF placement upon discharge: No  Patient  Condition: This patient's condition remains as documented in the consult dated 10/11/15, in which the Rehabilitation Physician determined and documented that the patient's condition is appropriate for intensive rehabilitative care in an inpatient rehabilitation facility. Will admit to inpatient rehab today.  Preadmission Screen Completed By: Gunnar Fusi, 10/12/2015 3:20 PM ______________________________________________________________________  Discussed status with Dr. Letta Pate on 10/12/15 at 1530 and received telephone approval for admission today.  Admission Coordinator: Gunnar Fusi, time 1530/Date 10/12/15          Cosigned by: Charlett Blake, MD at 10/12/2015 3:56 PM  Revision History     Date/Time User Provider Type Action   10/12/2015 3:56 PM Charlett Blake, MD Physician Cosign   10/12/2015 3:34 PM Gunnar Fusi Rehab Admission Coordinator Sign

## 2015-10-12 NOTE — Progress Notes (Signed)
Subjective:  Currently lying in bed.  She complains that some of her blood pressure medicine is making her sleepy.  Blood pressure is coming under better control.  Objective:  Vital Signs in the last 24 hours: BP 146/80 mmHg  Pulse 69  Temp(Src) 97.3 F (36.3 C) (Axillary)  Resp 20  Ht 4\' 11"  (1.499 m)  Wt 120.8 kg (266 lb 5.1 oz)  BMI 53.76 kg/m2  SpO2 94%  LMP 02/17/2011  Physical Exam: Morbidly obese black female in no acute distress, Lungs:  Reduced breath sounds Cardiac:  Regular rhythm, normal S1 and S2, no S3 Extremities:  No edema present  Intake/Output from previous day:    Weight Filed Weights   10/06/15 1706 10/06/15 2345  Weight: 113.399 kg (250 lb) 120.8 kg (266 lb 5.1 oz)    Lab Results: Basic Metabolic Panel:  Recent Labs  10/11/15 0357 10/12/15 0238  NA 139 139  K 4.1 4.4  CL 102 103  CO2 32 32  GLUCOSE 104* 102*  BUN 14 15  CREATININE 0.94 0.97   CBC:  Recent Labs  10/11/15 0357 10/12/15 0238  WBC 8.0 6.4  HGB 9.1* 9.5*  HCT 32.6* 33.8*  MCV 75.1* 74.4*  PLT 266 290   Telemetry: Normal sinus rhythmrate around 75  Assessment/Plan:  1.  Hypertensive heart disease with LVH 2.  Lethargy that may be due to high-dose clonidine 3.  Morbid obesity  Recommendations:  Blood pressure is coming under better control at this time.  With the side effects of dry mouth and lethargy don't think clonidine and will be a good long-term option for her.  I will increase her beta blocker and her diuretic based regimen as Blacks respond better to a diuretic based regimen.  Weight loss certainly should help.again. Needs to arrange primary care follow-up on Discharge.   We'll try to cut back on clonidine some to help with lethargy.  Kerry Hough  MD [facility]  10/12/2015, 8:30 AM

## 2015-10-12 NOTE — PMR Pre-admission (Signed)
PMR Admission Coordinator Pre-Admission Assessment  Patient: [name] is an 55 y.o., female MRN: [id number] DOB: July 17, 1960 Height: 4\' 11"  (149.9 cm) Weight: 120.8 kg (266 lb 5.1 oz)              Insurance Information HMO:     PPO:      PCP:      IPA:      80/20:      OTHER:  PRIMARY: uninsured        Policy#:       Subscriber:  CM Name:       Phone#:      Fax#:  Pre-Cert#:       Employer:  Benefits:  Phone #:      Name:  Eff. Date:      Deduct:       Out of Pocket Max:       Life Max:  CIR:       SNF:  Outpatient:      Co-Pay:  Home Health:       Co-Pay:  DME:      Co-Pay:  Providers:   Medicaid Application Date: request to initiate 10/12/15      Case Manager:  Disability Application Date:       Case Worker:   Emergency Contact Information Contact Information    Name Relation Home Work Mobile   Lore,Tyrone Spouse [phone]     Robinson,Shelitha Daughter [phone]     Robinson,Latoya Daughter [phone]       Current Medical History  Patient Admitting Diagnosis: Right temporal ICH with seizure  History of Present Illness: [name] is a 54 year old RH-female with history of morbid obesity, HTN, iron deficiency anemia who was admitted on 10/06/15 with sudden LOC and witnessed seizure activity. She was started on Keppra and cleviprex for treatment. 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 NIHSS 0. She did complain of chest pain and CTA chest negative for PE. Cardiology consulted due to elevated troponin and felt that elevation likely due to [place]. She had low risk nuclear stress test in 2012; does get SOB with mild to moderate activity but no prior history of CAD. Cardiac echo with EF [id number], grade 2 diastolic dysfunction and LVH without wall abnormality. Medications adjusted for management fo hypertensive hear disease BLE dopplers negative for DVT. MRI/MRA brain with stable right temporal IPH with mild edema and evidence of chronic small micro  hemorrhages. Neurology felt that [place] due to uncontrolled HTN and SBP goal <160. Family concerned about ongoing issues with sedation but Dr. Erlinda Hong recommends keeping higher dose Keppra due to patient seizing on lower dose. Patient with concerns about lethargy due to BP meds and clonidine taper. Therapy initiated and patient limited by dizziness, intermittent nausea and balance deficits. CIR recommended for follow up therapy.   NIH Total: 0    Past Medical History  Past Medical History  Diagnosis Date  . Hypertension   . Iron deficiency anemia     Family History  family history is not on file.  Prior Rehab/Hospitalizations:  Has the patient had major surgery during 100 days prior to admission? No  Current Medications   Current facility-administered medications:  .  acetaminophen (TYLENOL) tablet 650 mg, 650 mg, Oral, Q4H PRN, 650 mg at 10/10/15 1433 **OR** [DISCONTINUED] acetaminophen (TYLENOL) suppository 650 mg, 650 mg, Rectal, Q4H PRN, Wallie Char .  amLODipine (NORVASC) tablet 10 mg, 10 mg, Oral, Daily, Jacolyn Reedy, MD, 10 mg  at 10/12/15 1046 .  antiseptic oral rinse (CPC / CETYLPYRIDINIUM CHLORIDE 0.05%) solution 7 mL, 7 mL, Mouth Rinse, BID, Wallie Char, 7 mL at 10/11/15 1022 .  bisacodyl (DULCOLAX) suppository 10 mg, 10 mg, Rectal, Daily PRN, Wallie Char .  chlorthalidone (HYGROTON) tablet 25 mg, 25 mg, Oral, Daily, Jacolyn Reedy, MD, 25 mg at 10/12/15 1046 .  cloNIDine (CATAPRES) tablet 0.2 mg, 0.2 mg, Oral, BID, Jacolyn Reedy, MD, 0.2 mg at 10/12/15 1046 .  fluticasone (FLONASE) 50 MCG/ACT nasal spray 1 spray, 1 spray, Each Nare, Daily, Wallie Char, 1 spray at 10/12/15 1000 .  heparin injection 5,000 Units, 5,000 Units, Subcutaneous, Q8H, Rosalin Hawking, MD, 5,000 Units at 10/10/15 2129 .  hydroxypropyl methylcellulose / hypromellose (ISOPTO TEARS / GONIOVISC) 2.5 % ophthalmic solution 2 drop, 2 drop, Both Eyes, PRN, Early Chars Rinehuls, PA-C, 2 drop at 10/07/15  1702 .  insulin aspart (novoLOG) injection 0-9 Units, 0-9 Units, Subcutaneous, TID WC, David L Rinehuls, PA-C, 1 Units at 10/10/15 0804 .  labetalol (NORMODYNE,TRANDATE) injection 10-40 mg, 10-40 mg, Intravenous, Q10 min PRN, Rosalin Hawking, MD, 20 mg at 10/09/15 2236 .  levETIRAcetam (KEPPRA) tablet 750 mg, 750 mg, Oral, BID, Catha Gosselin, MD, 750 mg at 10/12/15 1046 .  metoprolol (LOPRESSOR) tablet 50 mg, 50 mg, Oral, BID, Jacolyn Reedy, MD, 50 mg at 10/12/15 1046 .  nitroGLYCERIN (NITROSTAT) SL tablet 0.4 mg, 0.4 mg, Sublingual, Q5 min PRN, Geronimo Boot, MD, 0.4 mg at 10/06/15 2023 .  ondansetron (ZOFRAN) injection 4 mg, 4 mg, Intravenous, Q6H PRN, Catha Gosselin, MD, 4 mg at 10/08/15 0435 .  senna-docusate (Senokot-S) tablet 1 tablet, 1 tablet, Oral, BID, Wallie Char, 1 tablet at 10/12/15 1046 .  sodium chloride (OCEAN) 0.65 % nasal spray 1 spray, 1 spray, Each Nare, PRN, Wallie Char  Patients Current Diet: Diet Carb Modified Fluid consistency:: Thin; Room service appropriate?: Yes  Precautions / Restrictions Precautions Precautions: Fall Restrictions Weight Bearing Restrictions: No   Has the patient had 2 or more falls or a fall with injury in the past year?Yes patient reports a fall in her bathroom 1-2 months ago that hurt her head and neck, though she never sought medical treatment for it  Prior Activity Level Community (5-7x/wk): Prior to admission patient was in the community daily driving and working as a Programmer, 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 None of the above  Prior Functional Level Prior Function Level of Independence: Independent Comments: working as Psychologist, counselling  Self Care: Did the patient need help bathing, dressing, using the toilet or  eating? Independent  Indoor Mobility: Did the patient need assistance with walking from room to room (with or without device)? Independent  Stairs: Did the patient need assistance with internal or external stairs (with or without device)? Independent  Functional Cognition: Did the patient need help planning regular tasks such as shopping or remembering to take medications? Independent  Current Functional Level Cognition  Overall Cognitive Status:  (need to further assess) Difficult to assess due to: Level of arousal Orientation Level: Oriented X4    Extremity Assessment (includes Sensation/Coordination)  Upper Extremity Assessment: Generalized weakness (using BUE for functional tasks)  Lower Extremity Assessment: Generalized weakness, Defer to PT evaluation    ADLs  Overall ADL's : Needs  assistance/impaired Eating/Feeding: Modified independent Grooming: Set up, Sitting, Supervision/safety Upper Body Bathing: Set up, Supervision/ safety, Sitting Lower Body Bathing: Moderate assistance, Sit to/from stand Upper Body Dressing : Minimal assistance, Sitting Upper Body Dressing Details (indicate cue type and reason): pt falling posteriorly due to "dizziness" Lower Body Dressing: Moderate assistance, Sit to/from stand Toilet Transfer: +2 for safety/equipment, Minimal assistance, RW, BSC Toileting- Clothing Manipulation and Hygiene: Moderate assistance, Sit to/from stand Functional mobility during ADLs: Minimal assistance, Rolling walker, Cueing for safety, Cueing for sequencing, +2 for safety/equipment General ADL Comments: limited at times by dizziness    Mobility  Overal bed mobility: Needs Assistance Bed Mobility: Supine to Sit Supine to sit: Min assist General bed mobility comments: in recliner    Transfers  Overall transfer level: Needs assistance Equipment used: Rolling walker (2 wheeled) Transfers: Sit to/from Stand Sit to Stand: Min assist, +2 safety/equipment General  transfer comment: performed from chair x3 and BSC x2, Multi modal cues for hand placement and safety, assist to power up to standing and stabilize    Ambulation / Gait / Stairs / Wheelchair Mobility  Ambulation/Gait Ambulation/Gait assistance: Min assist, +2 safety/equipment (Moderate assist upon reaching 20 ft due to dizziness ) Ambulation Distance (Feet): 20 Feet Assistive device: Rolling walker (2 wheeled) Gait Pattern/deviations: Step-through pattern, Decreased stride length, Trunk flexed, Wide base of support General Gait Details: significant dizziness reported in standing (required chair follow) Gait velocity: slow Gait velocity interpretation: Below normal speed for age/gender    Posture / Balance Balance Overall balance assessment: Needs assistance Sitting-balance support: Feet supported Sitting balance-Leahy Scale: Fair Standing balance support: During functional activity Standing balance-Leahy Scale: Poor (posterior bias at times) Standing balance comment: reliance on assist and UE support    Special needs/care consideration BiPAP/CPAP: No MD recommended follow up for outpatient sleep study per patient report  CPM: No Continuous Drip IV: No Dialysis: No         Life Vest: No Oxygen: Yes currently on 2L [place], none PTA Special Bed: No Trach Size: No Wound Vac (area): No       Skin: WDL                               Bowel mgmt: 10/12/15 Bladder mgmt: intermittent continence, patient reports sensation with new urgency Diabetic mgmt: [id number]     Previous Home Environment Living Arrangements: Spouse/significant other, Other relatives (brother and sister in law) Available Help at Discharge: Family, Available 24 hours/day Type of Home: House Home Layout: One level Home Access: Stairs to enter Entrance Stairs-Rails: Right, Left Entrance Stairs-Number of Steps: several Bathroom Shower/Tub: Chiropodist: Standard Home Care Services: No  Discharge  Living Setting Plans for Discharge Living Setting: Patient's home Type of Home at Discharge: House Discharge Home Layout: One level Discharge Home Access: Stairs to enter Entrance Stairs-Rails: Can reach both Entrance Stairs-Number of Steps: 4 Discharge Bathroom Shower/Tub: Tub/shower unit, Curtain Discharge Bathroom Toilet: Standard Discharge Bathroom Accessibility: Yes How Accessible: Accessible via walker Does the patient have any problems obtaining your medications?: Yes (Describe) (did not seek medical care in the past due to the expense )  Social/Family/Support Systems Patient Roles: Spouse, Partner, Other (Comment) (sibling) Anticipated Caregiver: Ulrike Commons, [phone] Anticipated Caregiver's Contact Information: Larena Sox [phone] Ability/Limitations of Caregiver: husband available 24/7 and able to provide light physical assist  Caregiver Availability: 24/7 Discharge Plan Discussed with Primary Caregiver: No (discussed with patient and sister  who is also able to help ) Is Caregiver In Agreement with Plan?: Yes (per report) Does Caregiver/Family have Issues with Lodging/Transportation while Pt is in Rehab?: No  Goals/Additional Needs Patient/Family Goal for Rehab: PT/OT Supervision-Mod I  Expected length of stay: 12-16 days Cultural Considerations: None Dietary Needs: Heart Healthy and Carb Mod  Equipment Needs: TBD Special Service Needs: Development worker, community for disability and Medicaid contacted 7/20 Additional Information: Sisters Olin Hauser and Darwin are supportive  Pt/Family Agrees to Admission and willing to participate: Yes Program Orientation Provided & Reviewed with Pt/Caregiver Including Roles  & Responsibilities: Yes Additional Information Needs: Assist with medical follow up and Rx expenses  Information Needs to be Provided By: Development worker, community and CSW  Decrease burden of Care through IP rehab admission: No  Possible need for SNF placement upon  discharge: No  Patient Condition: This patient's condition remains as documented in the consult dated 10/11/15, in which the Rehabilitation Physician determined and documented that the patient's condition is appropriate for intensive rehabilitative care in an inpatient rehabilitation facility. Will admit to inpatient rehab today.  Preadmission Screen Completed By:  Gunnar Fusi, 10/12/2015 3:20 PM ______________________________________________________________________   Discussed status with Dr. Letta Pate on 10/12/15 at 1530 and received telephone approval for admission today.  Admission Coordinator:  Gunnar Fusi, time 1530/Date 10/12/15

## 2015-10-12 NOTE — Discharge Summary (Addendum)
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 Elevated troponin, secondary to ICH.   Past Medical History  Diagnosis Date  . Hypertension   . Iron deficiency anemia    Past Surgical History  Procedure Laterality Date  . Cardiac catheterization  04/2010    Dr. Terrence Dupont    Medications to be continued on Rehab . amLODipine  10 mg Oral Daily  . antiseptic oral rinse  7 mL Mouth Rinse BID  . chlorthalidone  25 mg Oral Daily  . cloNIDine  0.2 mg Oral BID  . fluticasone  1 spray Each Nare Daily  . heparin subcutaneous  5,000 Units Subcutaneous Q8H  . insulin aspart  0-9 Units Subcutaneous TID WC  . levETIRAcetam  750 mg Oral BID  . metoprolol tartrate  50 mg Oral BID  . senna-docusate  1 tablet Oral BID    LABORATORY STUDIES CBC    Component Value Date/Time   WBC 6.4 10/12/2015 0238   RBC 4.54 10/12/2015 0238   RBC 4.38 05/18/2010 0113   HGB 9.5* 10/12/2015 0238   HCT 33.8* 10/12/2015 0238   PLT 290 10/12/2015 0238   MCV 74.4* 10/12/2015 0238   MCH 20.9* 10/12/2015 0238   MCHC 28.1* 10/12/2015 0238   RDW 17.8* 10/12/2015 0238   LYMPHSABS 1.7 10/06/2015 1812   MONOABS 0.5 10/06/2015 1812   EOSABS 0.0 10/06/2015 1812   BASOSABS 0.0 10/06/2015 1812    CMP    Component Value Date/Time   NA 139 10/12/2015 0238   K 4.4 10/12/2015 0238   CL 103 10/12/2015 0238   CO2 32 10/12/2015 0238   GLUCOSE 102* 10/12/2015 0238   BUN 15 10/12/2015 0238   CREATININE 0.97 10/12/2015 0238   CALCIUM 9.6 10/12/2015 0238   PROT 7.8 10/08/2015 0541   ALBUMIN 3.1* 10/08/2015 0541   AST 28 10/08/2015 0541   ALT 20 10/08/2015 0541   ALKPHOS 57 10/08/2015 0541   BILITOT 0.5 10/08/2015 0541   GFRNONAA >60 10/12/2015 0238   GFRAA >60 10/12/2015 0238   COAGSNo results found for: INR, PROTIME Lipid Panel    Component Value Date/Time   CHOL 112 10/08/2015 0541   TRIG 62 10/08/2015 0541   HDL 42 10/08/2015 0541   CHOLHDL 2.7 10/08/2015 0541   VLDL 12 10/08/2015 0541   LDLCALC 58 10/08/2015 0541   HgbA1C  Lab Results  Component Value Date   HGBA1C 5.7* 10/07/2015   Cardiac Panel (last 3 results) No results for input(s): CKTOTAL, CKMB, TROPONINI, RELINDX in the last 72 hours. Urinalysis    Component Value Date/Time   COLORURINE YELLOW 10/07/2015 0121   APPEARANCEUR CLEAR 10/07/2015 0121   LABSPEC 1.025 10/07/2015 0121   PHURINE 5.0  10/07/2015 0121   GLUCOSEU NEGATIVE 10/07/2015 0121   HGBUR TRACE* 10/07/2015 0121   BILIRUBINUR NEGATIVE 10/07/2015 0121   KETONESUR NEGATIVE 10/07/2015 0121   PROTEINUR >300* 10/07/2015 0121   UROBILINOGEN 0.2 05/18/2010 0237   NITRITE NEGATIVE 10/07/2015 0121   LEUKOCYTESUR NEGATIVE 10/07/2015 0121   Urine Drug Screen No results found for: LABOPIA, COCAINSCRNUR, LABBENZ, AMPHETMU, THCU, LABBARB  Alcohol Level No results found for: [facility]   SIGNIFICANT DIAGNOSTIC STUDIES Ct Head Wo Contrast 10/07/2015  1. Similar size of parenchymal hemorrhage centered at the posterior right temporal lobe, estimated volume 2.2 cc. Minimal localized edema without significant mass effect.  2. Moderate chronic microvascular ischemic disease.   10/06/2015  1. Examination is positive for a hyperdense mass within the right  posterior temporoparietal lobe. In the acute setting finding is concerning for a focal hemorrhagic infarct.  2. Mild chronic small vessel ischemic change.  10/08/2015  2 x 1.9 x 1.3 cm (volume = 2.6 cc) posterior right temporal lobe hemorrhage without significant change from prior examination (when utilizing similar landmarks). The degree of surrounding vasogenic edema is minimally more prominent. No new intracranial hemorrhage.   CTA head and neck  10/09/2015 1. Negative for dural venous sinus thrombosis. The bilateral transverse and sigmoid sinuses are diminutive but patent. Both IJ bulbs are patent. Mildly prominent right jugular foramen pars nervosa incidentally noted.  2. Negative arterial findings on CTA head and neck aside from vessel tortuosity. Retropharyngeal course of both carotid arteries. 3. Stable appearance of posterior right hemisphere intra-axial hemorrhage since 10/06/2015. Mild surrounding edema with no significant mass effect. No associated cortical vein thrombosis is identified. No evidence of associated AVM. 4. Cardiomegaly re-demonstrated. Central pulmonary artery enlargement may indicate a degree of pulmonary artery hypertension. Increased pulmonary septal thickening in the lung apices may indicate mild interstitial edema.  Mr Brain Wo Contrast 10/10/2015 IMPRESSION: 1. Stable size and appearance of posterior right temporal intraparenchymal hemorrhage with mild localized edema without significant mass effect. 2. Few additional scattered foci susceptibility artifact within the supratentorial and infratentorial brain, most consistent with small chronic micro hemorrhages. Underlying hypertensive etiology is favored. 3. Moderate chronic microvascular ischemic disease. 4. Empty sella  Ct Angio Chest Pe W/cm &/or Wo Cm 10/06/2015  No CT evidence of pulmonary embolism. Cardiomegaly with mild edema or congestive changes. No focal consolidation.   Carotid  Ultrasound 10/07/2015 No significant extracranial carotid artery stenosis demonstrated. Right vertebral artery was not clearly visualized. Left vertebral artery demonstrated antegrade flow.  LTM EEG Diffuse slowing - background slowing and diffuse rhythmic delta activity. No electrographic seizures were seen.  TTE  - Left ventricle: The cavity size was normal. There was moderate concentric hypertrophy. Systolic function was normal. The estimated ejection fraction was in the range of 60% to 65%. Wall motion was normal; there were no regional wall motion abnormalities. Features are consistent with a pseudonormal left ventricular filling pattern, with concomitant abnormal relaxation and increased filling pressure (grade 2 diastolic dysfunction). Doppler parameters are consistent with elevated ventricular end-diastolic filling pressure. - Aortic valve: Trileaflet; mildly thickened, mildly calcified leaflets. There was no regurgitation. - Aortic root: The aortic root was normal in size. - Mitral valve: Structurally normal valve. There was trivial regurgitation. - Left atrium: The atrium was normal in size. - Right ventricle: The cavity size was normal. Wall thickness was normal. Systolic function was normal. - Right atrium: The atrium was normal in size. - Tricuspid valve: There was trivial regurgitation. - Pulmonic valve: There was no  regurgitation. - Pulmonary arteries: Systolic pressure was within the normal range. - Inferior vena cava: The vessel was dilated. The respirophasic diameter changes were blunted (< 50%), consistent with elevated central venous pressure. - Pericardium, extracardiac: There was no pericardial effusion.  LE venous doppler  No evidence of DVT, superficial thrombosis, or Baker's Cyst.     HISTORY OF PRESENT ILLNESS [name] is an 55 y.o. female with a history of hypertension brought to the emergency room following sudden onset of loss of consciousness and  witnessed generalized seizure activity (LKW 10/06/2015 at 1500). Patient has no history of seizure disorder. There is also no history of syncope. She was confused on regaining consciousness. CT scan of her head showed acute right posterior parietal intracerebral hemorrhage. Patient has not experienced visual changes nor change in speech. She also has not experienced weakness and numbness involving the extremities. NIH stroke score was 0. She also complained of chest pain. CT scan of her chest showed no signs of an acute pulmonary embolus. Troponin was elevated at 0.09. Repeat troponin studies are pending. Cardiology was consulted. tPA was not administered secondary to acute ICH. She was admitted to neuro ICU for further evaluation and treatment.    HOSPITAL COURSE [name] is a 55 y.o. female with history of hypertension presenting with right temporal ICH and seizure. She did not receive IV t-PA due to [place].   ICH - right temporal small ICH, likely related to untreated HTN  MRI - right temporal stable ICH and no hemorrhagic infarct  Need to repeat MRI with and without contrast and MRA as outpt on follow up  Repeat CT x 3 - stable right temporal small ICH  CTA and CTV neg for AVM or venous sinus thrombosis. Stable hemorrhage  Carotid Doppler - Right VA not clearly visualized.  2D Echo EF 60-65%. No source of embolus  LE venous doppler negative for DVT  LTM EEG no seizure   LDL 58  HgbA1c 5.7  No antithrombotic prior to admission  Therapy recommendations: CIR  Disposition: CIR  Hypertension Hypertensive Heart disease with LVH  BP 166/90 on arrival  At discharge, Stable  On amlodipine 10mg , hygroton 25mg , clonidine 0.2mg  tid and metoprolol 25mg  bid   In hospital goal < 160, long-term goal normotensive  cardiology consulted   Seizure  On admission  Started on Keppra  Increased to 750mg  bid due to concerning for seizure on keppra 500mg  bid  Lethargic on  Keppra but agree to continue current dose  LT EEG negative for seizure  Continue to monitor  No driving until seizure free for 6 months and under physician's care.   Other Stroke Risk Factors  Morbid obesity, Body mass index is 53.76 kg/(m^2)., recommend weight loss, diet and exercise as appropriate   Positive Family hx stroke   Obstructive sleep apnea - need outpt evaluation  Other Active Problems  Hypokalemia resolved.  Microcytic anemia: Iron and TIBC - low - Will not add iron at this time as contraindicated with acute ICH  Elevated troponin, secondary to [place]. Doubt ischemia. No further workup   DISCHARGE EXAM Blood pressure 136/89, pulse 63, temperature 98.2 F (36.8 C), temperature source Oral, resp. rate 24, height 4\' 11"  (1.499 m), weight 120.8 kg (266 lb 5.1 oz), last menstrual period 02/17/2011, SpO2 100 %. General - morbid obesity, well developed, in no apparent distress.  Ophthalmologic - Fundi not visualized due to eye movement.  Cardiovascular - Regular rate and rhythm.  Mental  Status -  Level of arousal and orientation to time, place, and person were intact. Language including expression, naming, repetition, comprehension was assessed and found intact.  Cranial Nerves II - XII - II - Visual field intact OU. III, IV, VI - Extraocular movements intact. V - Facial sensation intact bilaterally. VII - Facial movement intact bilaterally. VIII - Hearing & vestibular intact bilaterally. X - Palate elevates symmetrically. XI - Chin turning & shoulder shrug intact bilaterally. XII - Tongue protrusion intact.  Motor Strength - The patient's strength was normal in all extremities and pronator drift was absent. Bulk was normal and fasciculations were absent.  Motor Tone - Muscle tone was assessed at the neck and appendages and was normal.  Reflexes - The patient's reflexes were 1+ in all extremities and she had no pathological reflexes.  Sensory - Light  touch, temperature/pinprick were assessed and were symmetrical.   Coordination - The patient had normal movements in the hands with no ataxia or dysmetria. Tremor was absent.  Gait and Station - not tested.   Discharge Diet  Diet Carb Modified Fluid consistency:: Thin; Room service appropriate?: Yes liquids  DISCHARGE PLAN  Disposition:  Transfer to [facility] for ongoing PT, OT and ST  Due to hemorrhage and risk of bleeding, do not take aspirin, aspirin-containing medications, or ibuprofen products   OP sleep study evaluation needed. Dr. Erlinda Hong can arrange at follow up  Recommend ongoing risk factor control by Primary Care Physician at time of discharge from inpatient rehabilitation.  Follow-up No PCP Per Patient in 2 weeks following discharge from rehab.  Follow-up with Dr. Rosalin Hawking, Stroke Clinic in 2 months, office to schedule an appointment.   45 minutes were spent preparing discharge.  [place]on [place] for Pager information 10/12/2015 3:20 PM   I, the attending vascular neurologist, have personally obtained a history, examined the patient, evaluated laboratory data, individually viewed imaging studies and agree with radiology interpretations. Together with the NP/PA, we formulated the assessment and plan of care which reflects our mutual decision.  I have made any additions or clarifications directly to the above note and agree with the findings and plan as currently documented.   Right temporal small ICH, etiology not for sure, but likely due to HTN as other conditions so far ruled out. But need follow up MRI with and w/o contrast and MRA / possible MRV. Also need outpt sleep study for OSA. Long term BP goal < 140. Continue keppra for seizure prevention. No driving until seizure free for 6 months. Ready for ICH this time.  Rosalin Hawking, MD PhD Stroke Neurology 10/12/2015 5:44 PM

## 2015-10-12 NOTE — Progress Notes (Addendum)
Inpatient Rehabilitation  Met with patient and sister at bedside to discuss team's recommendation for IP Rehab.  I shared booklet and answered questions.  Patient concerned about being uninsured; I contacted assigned financial counselor, Wendall Stade and requested that disability and Medicaid application process be started.    Update: patient medically cleared to admit to IP Rehab today.  Admission paperwork completed and plan to admit today.  Please call with questions.    Carmelia Roller., CCC/SLP Admission Coordinator  [place]  Cell [phone]

## 2015-10-12 NOTE — Progress Notes (Signed)
STROKE TEAM PROGRESS NOTE   SUBJECTIVE (INTERVAL HISTORY) Patient up in chair at the bedside. Pt stated that she was sleepy after taking keppra. And family concerned about ongoing sedation. Discussed Keppra dosage. Due to her seizure on presentation and concerned for recurrent seizure - would like to keep higher dose as she seized on lower dose. She is in agreement.    OBJECTIVE Temp:  [97.3 F (36.3 C)-98.6 F (37 C)] 98.6 F (37 C) (07/20 0940) Pulse Rate:  [62-75] 75 (07/20 0940) Cardiac Rhythm:  [-] Normal sinus rhythm (07/19 2000) Resp:  [20-22] 22 (07/20 0940) BP: (136-151)/(70-96) 151/70 mmHg (07/20 0940) SpO2:  [94 %-100 %] 100 % (07/20 0940)  CBC:   Recent Labs Lab 10/06/15 1812  10/11/15 0357 10/12/15 0238  WBC 12.7*  < > 8.0 6.4  NEUTROABS 10.5*  --   --   --   HGB 10.9*  < > 9.1* 9.5*  HCT 37.2  < > 32.6* 33.8*  MCV 72.7*  < > 75.1* 74.4*  PLT 262  < > 266 290  < > = values in this interval not displayed.  Basic Metabolic Panel:   Recent Labs Lab 10/11/15 0357 10/12/15 0238  NA 139 139  K 4.1 4.4  CL 102 103  CO2 32 32  GLUCOSE 104* 102*  BUN 14 15  CREATININE 0.94 0.97  CALCIUM 9.3 9.6    Lipid Panel:     Component Value Date/Time   CHOL 112 10/08/2015 0541   TRIG 62 10/08/2015 0541   HDL 42 10/08/2015 0541   CHOLHDL 2.7 10/08/2015 0541   VLDL 12 10/08/2015 0541   LDLCALC 58 10/08/2015 0541   HgbA1c:  Lab Results  Component Value Date   HGBA1C 5.7* 10/07/2015   Urine Drug Screen: No results found for: LABOPIA, COCAINSCRNUR, LABBENZ, AMPHETMU, THCU, LABBARB    IMAGING I have personally reviewed the radiological images below and agree with the radiology interpretations.   Ct Head Wo Contrast 10/07/2015   1. Similar size of parenchymal hemorrhage centered at the posterior right temporal lobe, estimated volume 2.2 cc. Minimal localized edema without significant mass effect.  2. Moderate chronic microvascular ischemic disease.    10/06/2015   1. Examination is positive for a hyperdense mass within the right posterior temporoparietal lobe. In the acute setting finding is concerning for a focal hemorrhagic infarct.  2. Mild chronic small vessel ischemic change.  10/08/2015  IMPRESSION: 2 x 1.9 x 1.3 cm (volume = 2.6 cc) posterior right temporal lobe hemorrhage without significant change from prior examination (when utilizing similar landmarks). The degree of surrounding vasogenic edema is minimally more prominent. No new intracranial hemorrhage.   Ct Angio Chest Pe W/cm &/or Wo Cm 10/06/2015   No CT evidence of pulmonary embolism. Cardiomegaly with mild edema or congestive changes. No focal consolidation.   Carotid Ultrasound 10/07/2015 No significant extracranial carotid artery stenosis demonstrated. Right vertebral artery was not clearly visualized. Left vertebral artery demonstrated antegrade flow.  LTM EEG Diffuse slowing - background slowing and diffuse rhythmic delta activity. No electrographic seizures were seen.  TTE  - Left ventricle: The cavity size was normal. There was moderate concentric hypertrophy. Systolic function was normal. The estimated ejection fraction was in the range of 60% to 65%. Wall motion was normal; there were no regional wall motion abnormalities. Features are consistent with a pseudonormal left ventricular filling pattern, with concomitant abnormal relaxation and increased filling pressure (grade 2 diastolic dysfunction). Doppler parameters are consistent with  elevated ventricular end-diastolic filling pressure. - Aortic valve: Trileaflet; mildly thickened, mildly calcified leaflets. There was no regurgitation. - Aortic root: The aortic root was normal in size. - Mitral valve: Structurally normal valve. There was trivial regurgitation. - Left atrium: The atrium was normal in size. - Right ventricle: The cavity size was normal. Wall thickness was normal. Systolic function was normal. -  Right atrium: The atrium was normal in size. - Tricuspid valve: There was trivial regurgitation. - Pulmonic valve: There was no regurgitation. - Pulmonary arteries: Systolic pressure was within the normal range. - Inferior vena cava: The vessel was dilated. The respirophasic diameter changes were blunted (< 50%), consistent with elevated central venous pressure. - Pericardium, extracardiac: There was no pericardial effusion.  LE venous doppler  No evidence of DVT, superficial thrombosis, or Baker's Cyst.  CTA head and neck  10/09/2015 1. Negative for dural venous sinus thrombosis. The bilateral transverse and sigmoid sinuses are diminutive but patent. Both IJ bulbs are patent. Mildly prominent right jugular foramen pars nervosa incidentally noted.  2. Negative arterial findings on CTA head and neck aside from vessel tortuosity. Retropharyngeal course of both carotid arteries. 3. Stable appearance of posterior right hemisphere intra-axial hemorrhage since 10/06/2015. Mild surrounding edema with no significant mass effect. No associated cortical vein thrombosis is identified. No evidence of associated AVM. 4. Cardiomegaly re-demonstrated. Central pulmonary artery  enlargement may indicate a degree of pulmonary artery hypertension. Increased pulmonary septal thickening in the lung apices may indicate mild interstitial edema.  Mr Brain Wo Contrast 10/10/2015  IMPRESSION: 1. Stable size and appearance of posterior right temporal intraparenchymal hemorrhage with mild localized edema without significant mass effect. 2. Few additional scattered foci susceptibility artifact within the supratentorial and infratentorial brain, most consistent with small chronic micro hemorrhages. Underlying hypertensive etiology is favored. 3. Moderate chronic microvascular ischemic disease. 4. Empty sella.    PHYSICAL EXAM  Temp:  [97.3 F (36.3 C)-98.6 F (37 C)] 98.6 F (37 C) (07/20 0940) Pulse Rate:  [62-75] 75  (07/20 0940) Resp:  [20-22] 22 (07/20 0940) BP: (136-151)/(70-96) 151/70 mmHg (07/20 0940) SpO2:  [94 %-100 %] 100 % (07/20 0940)  General - morbid obesity, well developed, in no apparent distress.  Ophthalmologic - Fundi not visualized due to eye movement.  Cardiovascular - Regular rate and rhythm.  Mental Status -  Level of arousal and orientation to time, place, and person were intact. Language including expression, naming, repetition, comprehension was assessed and found intact.  Cranial Nerves II - XII - II - Visual field intact OU. III, IV, VI - Extraocular movements intact. V - Facial sensation intact bilaterally. VII - Facial movement intact bilaterally. VIII - Hearing & vestibular intact bilaterally. X - Palate elevates symmetrically. XI - Chin turning & shoulder shrug intact bilaterally. XII - Tongue protrusion intact.  Motor Strength - The patient's strength was normal in all extremities and pronator drift was absent.  Bulk was normal and fasciculations were absent.   Motor Tone - Muscle tone was assessed at the neck and appendages and was normal.  Reflexes - The patient's reflexes were 1+ in all extremities and she had no pathological reflexes.  Sensory - Light touch, temperature/pinprick were assessed and were symmetrical.    Coordination - The patient had normal movements in the hands with no ataxia or dysmetria.  Tremor was absent.  Gait and Station - not tested.   ASSESSMENT/PLAN Ms. RHEMY LANNOM is a 55 y.o. female with history of hypertension presenting  with right temporal ICH and seizure. She did not receive IV t-PA due to [place].   ICH - right temporal small ICH, likely related to untreated HTN  MRI - right temporal stable ICH and no hemorrhagic infarct  Repeat CT x 3 - stable right temporal small ICH  CTA and CTV neg for AVM or venous sinus thrombosis. Stable hemorrhage  Carotid Doppler - Right VA not clearly visualized.  2D Echo  EF 60-65%. No  source of embolus   LE venous doppler negative for DVT  LTM EEG no seizure   LDL 58   HgbA1c 5.7  SCDs and heparin subq for VTE prophylaxis Diet Carb Modified Fluid consistency:: Thin; Room service appropriate?: Yes  No antithrombotic prior to admission  Ongoing aggressive stroke risk factor management  Therapy recommendations:  CIR   Disposition:  Pending. Medically ready for transfer once bed available.   Hypertension  Remains Stable At Goal SBP< 160 On amlodipine 10mg , hygroton 25mg , clonidine 0.2mg  tid and metoprolol 25mg  bid  Appreciate cardiology recs  Seizure  On admission  Started on Keppra  Increased to 750mg  bid due to concerning for seizure on keppra 500mg  bid  Lethargic on Keppra but agree to continue current dose  LT EEG negative for seizure  Other Stroke Risk Factors  Morbid obesity, Body mass index is 53.76 kg/(m^2)., recommend weight loss, diet and exercise as appropriate   Positive Family hx stroke   Obstructive sleep apnea - need outpt evaluation  Other Active Problems  Hypokalemia resolved.  Microcytic anemia:  Iron and TIBC - low - Will not add iron at this time as contraindicated with acute [facility] day # 6  Rosalin Hawking, MD PhD Stroke Neurology 10/12/2015 5:41 PM  To contact Stroke Continuity provider, please refer to [email or web address] After hours, contact General Neurology

## 2015-10-13 ENCOUNTER — Inpatient Hospital Stay (HOSPITAL_COMMUNITY): Payer: Self-pay | Admitting: Occupational Therapy

## 2015-10-13 ENCOUNTER — Inpatient Hospital Stay (HOSPITAL_COMMUNITY): Payer: Self-pay | Admitting: Physical Therapy

## 2015-10-13 DIAGNOSIS — I951 Orthostatic hypotension: Secondary | ICD-10-CM | POA: Insufficient documentation

## 2015-10-13 DIAGNOSIS — R7303 Prediabetes: Secondary | ICD-10-CM

## 2015-10-13 DIAGNOSIS — E1169 Type 2 diabetes mellitus with other specified complication: Secondary | ICD-10-CM | POA: Insufficient documentation

## 2015-10-13 DIAGNOSIS — R7309 Other abnormal glucose: Secondary | ICD-10-CM

## 2015-10-13 DIAGNOSIS — E876 Hypokalemia: Secondary | ICD-10-CM

## 2015-10-13 DIAGNOSIS — I611 Nontraumatic intracerebral hemorrhage in hemisphere, cortical: Secondary | ICD-10-CM

## 2015-10-13 DIAGNOSIS — E119 Type 2 diabetes mellitus without complications: Secondary | ICD-10-CM | POA: Insufficient documentation

## 2015-10-13 DIAGNOSIS — D509 Iron deficiency anemia, unspecified: Secondary | ICD-10-CM | POA: Diagnosis present

## 2015-10-13 DIAGNOSIS — Z9981 Dependence on supplemental oxygen: Secondary | ICD-10-CM

## 2015-10-13 DIAGNOSIS — I619 Nontraumatic intracerebral hemorrhage, unspecified: Secondary | ICD-10-CM

## 2015-10-13 DIAGNOSIS — E669 Obesity, unspecified: Secondary | ICD-10-CM

## 2015-10-13 LAB — GLUCOSE, CAPILLARY
GLUCOSE-CAPILLARY: 107 mg/dL — AB (ref 65–99)
Glucose-Capillary: 90 mg/dL (ref 65–99)
Glucose-Capillary: 111 mg/dL — ABNORMAL HIGH (ref 65–99)
Glucose-Capillary: 97 mg/dL (ref 65–99)

## 2015-10-13 LAB — CBC WITH DIFFERENTIAL/PLATELET
BASOS ABS: 0.1 10*3/uL (ref 0.0–0.1)
BASOS PCT: 1 %
Eosinophils Absolute: 0.3 10*3/uL (ref 0.0–0.7)
Eosinophils Relative: 4 %
HEMATOCRIT: 32.4 % — AB (ref 36.0–46.0)
HEMOGLOBIN: 9.5 g/dL — AB (ref 12.0–15.0)
Lymphocytes Relative: 31 %
Lymphs Abs: 2 10*3/uL (ref 0.7–4.0)
MCH: 21.3 pg — AB (ref 26.0–34.0)
MCHC: 29.3 g/dL — AB (ref 30.0–36.0)
MCV: 72.8 fL — ABNORMAL LOW (ref 78.0–100.0)
MONOS PCT: 13 %
Monocytes Absolute: 0.8 10*3/uL (ref 0.1–1.0)
NEUTROS ABS: 3.2 10*3/uL (ref 1.7–7.7)
NEUTROS PCT: 51 %
Platelets: 304 10*3/uL (ref 150–400)
RBC: 4.45 MIL/uL (ref 3.87–5.11)
RDW: 17.9 % — ABNORMAL HIGH (ref 11.5–15.5)
WBC: 6.4 10*3/uL (ref 4.0–10.5)

## 2015-10-13 LAB — COMPREHENSIVE METABOLIC PANEL
ALBUMIN: 2.9 g/dL — AB (ref 3.5–5.0)
ALK PHOS: 49 U/L (ref 38–126)
ALT: 33 U/L (ref 14–54)
ANION GAP: 4 — AB (ref 5–15)
AST: 32 U/L (ref 15–41)
BILIRUBIN TOTAL: 0.4 mg/dL (ref 0.3–1.2)
BUN: 12 mg/dL (ref 6–20)
CALCIUM: 9.4 mg/dL (ref 8.9–10.3)
CO2: 35 mmol/L — AB (ref 22–32)
CREATININE: 0.92 mg/dL (ref 0.44–1.00)
Chloride: 100 mmol/L — ABNORMAL LOW (ref 101–111)
GFR calc Af Amer: >60 mL/min (ref >60)
GFR calc non Af Amer: >60 mL/min (ref >60)
GLUCOSE: 95 mg/dL (ref 65–99)
Potassium: 3.4 mmol/L — ABNORMAL LOW (ref 3.5–5.1)
SODIUM: 139 mmol/L (ref 135–145)
TOTAL PROTEIN: 7.3 g/dL (ref 6.5–8.1)

## 2015-10-13 LAB — OCCULT BLOOD X 1 CARD TO LAB, STOOL: FECAL OCCULT BLD: POSITIVE — AB

## 2015-10-13 MED ORDER — LEVETIRACETAM 750 MG PO TABS
750.0000 mg | ORAL_TABLET | Freq: Two times a day (BID) | ORAL | Status: DC
  Administered 2015-10-13 – 2015-10-21 (×16): 750 mg via ORAL
  Filled 2015-10-13 (×17): qty 1

## 2015-10-13 NOTE — Interval H&P Note (Signed)
[name] was admitted today to Inpatient Rehabilitation with the diagnosis of right temporal intracranial hemorrhage, complicated by morbid obesity.  The patient's history has been reviewed, patient examined, and there is no change in status.  Patient continues to be appropriate for intensive inpatient rehabilitation.  I have reviewed the patient's chart and labs.  Questions were answered to the patient's satisfaction. The PAPE has been reviewed and assessment remains appropriate.  [place] Cork Lorie Phenix 10/13/2015, 9:31 AM

## 2015-10-13 NOTE — H&P (View-Only) (Signed)
Physical Medicine and Rehabilitation Admission H&P    Chief Complaint  Patient presents with  . Dizziness and difficulty walking     HPI:  [name] is a 55 year old RH-female with history of morbid obesity, HTN, iron deficiency anemia who was admitted on 10/06/15 with sudden LOC and witnessed seizure activity.  She was confused on awakening and was started on Keppra and cleviprex for treatment.  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 NIHSS 0.  She did complain of chest pain and CTA chest negative for PE. Cardiology consulted due to elevated troponin and felt that elevation likely due to [place]. She had low risk nuclear stress test in 2012 --does get SOB with mild to moderate activity but no prior hx CAD.  Cardiac echo with EF 10-17%, grade 2 diastolic dysfunction and LVH without wall abnormality.  Medications adjusted for management fo hypertensive hear diseaseBLE dopplers negative for DVT. MRI/MRA brain with stable right temporal IPH with mild edema and evidence of chronic small micro hemorrhages. Neurology felt that [place] due to uncontrolled HTN and SBP goal  <160. Family concerned about ongoing issues with sedation but Dr. Erlinda Hong recommends keeping higher dose Keppra due to patient seizing on lower dose.  Patient with concerns about lethargy due to BP meds and clonidine tapered to help with symptoms.  Therapy initiated and patient limited by dizziness, intermittent nausea and balance deficits. CIR recommended for follow up therapy.   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  HENT: Negative for hearing loss.   Eyes: Positive for blurred vision. Negative for double vision.  Respiratory: Positive for shortness of breath. Negative for cough.   Cardiovascular: Negative for chest pain, palpitations and leg swelling.  Gastrointestinal: Positive for heartburn and diarrhea. Negative for nausea and  vomiting.  Genitourinary: Negative for dysuria.  Skin: Negative for rash.  Neurological: Positive for dizziness and weakness. Negative for speech change and headaches.  Psychiatric/Behavioral: Negative for depression and memory loss. The patient is not nervous/anxious.       Past Medical History  Diagnosis Date  . Hypertension   . Iron deficiency anemia     Past Surgical History  Procedure Laterality Date  . Cardiac catheterization  04/2010    Dr. Terrence Dupont    Family History  Problem Relation Age of Onset  . Diabetes Father   . Hypertension Father   . Hypertension Sister       Social History:   Married. Works as a Consulting civil engineer. Lives with husband (caregiver for 25 year old mother) and brother with TBI. She reports that she has never smoked. She does not have any smokeless tobacco history on file. She reports that she does not drink alcohol or use illicit drugs.    Allergies: No Known Allergies    Medications Prior to Admission  Medication Sig Dispense Refill  . fluticasone (FLONASE) 50 MCG/ACT nasal spray Place 1 spray into both nostrils daily as needed for allergies or rhinitis.    . predniSONE (DELTASONE) 10 MG tablet Take 5 tablets (50 mg total) by mouth daily. (Patient not taking: Reported on 10/06/2015) 20 tablet 0    Home: Home Living Family/patient expects to be discharged to:: Private residence Living Arrangements: Spouse/significant other, Other relatives (brother and sister in Sports [employer]) Available Help at Discharge: Family, Available 24 hours/day Type of Home: House Home Access: Stairs to enter [employer] of Steps: several  Entrance Stairs-Rails: Right, Left Home Layout: One level Bathroom Shower/Tub: Chiropodist: Standard   Functional History: Prior Function Level of Independence: Independent Comments: working as Water quality scientist Status:  Mobility: Bed Mobility Overal bed mobility: Needs Assistance Bed Mobility:  Supine to Sit Supine to sit: Min assist General bed mobility comments: in recliner Transfers Overall transfer level: Needs assistance Equipment used: Rolling walker (2 wheeled) Transfers: Sit to/from Stand Sit to Stand: Min assist, +2 safety/equipment General transfer comment: performed from chair x3 and BSC x2, Multi modal cues for hand placement and safety, assist to power up to standing and stabilize Ambulation/Gait Ambulation/Gait assistance: Min assist, +2 safety/equipment (Moderate assist upon reaching 20 ft due to dizziness ) Ambulation Distance (Feet): 20 Feet Assistive device: Rolling walker (2 wheeled) Gait Pattern/deviations: Step-through pattern, Decreased stride length, Trunk flexed, Wide base of support General Gait Details: significant dizziness reported in standing (required chair follow) Gait velocity: slow Gait velocity interpretation: Below normal speed for age/gender    ADL: ADL Overall ADL's : Needs assistance/impaired Eating/Feeding: Modified independent Grooming: Set up, Sitting, Supervision/safety Upper Body Bathing: Set up, Supervision/ safety, Sitting Lower Body Bathing: Moderate assistance, Sit to/from stand Upper Body Dressing : Minimal assistance, Sitting Upper Body Dressing Details (indicate cue type and reason): pt falling posteriorly due to "dizziness" Lower Body Dressing: Moderate assistance, Sit to/from stand Toilet Transfer: +2 for safety/equipment, Minimal assistance, RW, BSC Toileting- Clothing Manipulation and Hygiene: Moderate assistance, Sit to/from stand Functional mobility during ADLs: Minimal assistance, Rolling walker, Cueing for safety, Cueing for sequencing, +2 for safety/equipment General ADL Comments: limited at times by dizziness  Cognition: Cognition Overall Cognitive Status:  (need to further assess) Orientation Level: Oriented X4 Cognition Arousal/Alertness: Lethargic Behavior During Therapy: Flat affect Overall Cognitive  Status:  (need to further assess) Difficult to assess due to: Level of arousal  Physical Exam: Blood pressure 136/89, pulse 63, temperature 98.2 F (36.8 C), temperature source Oral, resp. rate 24, height _0  (1.499 m), weight 120.8 kg (266 lb 5.1 oz), last menstrual period 02/17/2011, SpO2 100 %. Physical Exam  Nursing note and vitals reviewed. Constitutional: She is oriented to person, place, and time. She appears well-developed and well-nourished. Nasal cannula in place.  HENT:  Head: Normocephalic and atraumatic.  Mouth/Throat: Oropharynx is clear and moist.  Eyes: Conjunctivae are normal. Pupils are equal, round, and reactive to light.  Neck: Normal range of motion. Neck supple.  Cardiovascular: Normal rate and regular rhythm.   Murmur heard. Respiratory: Effort normal and breath sounds normal. No stridor. No respiratory distress.  GI: Soft. Bowel sounds are normal. She exhibits no distension. There is no tenderness.  Musculoskeletal: She exhibits no edema or tenderness.  Neurological: She is alert and oriented to person, place, and time.  Skin: Skin is warm and dry.  Psychiatric: She has a normal mood and affect. Her behavior is normal. Thought content normal.  Motor strength is 4/5, bilateral deltoid, biceps, triceps, grip, hip flexor, knee extensors, ankle dose of Effexor. Sensation intact to light touch in bilateral upper and lower limbs  Results for orders placed or performed during the hospital encounter of 10/06/15 (from the past 48 hour(s))  Glucose, capillary     Status: Abnormal   Collection Time: 10/10/15  4:24 PM  Result Value Ref Range   Glucose-Capillary 115 (H) 65 - 99 mg/dL  Glucose, capillary     Status: Abnormal   Collection Time: 10/10/15  9:09 PM  Result Value Ref Range  Glucose-Capillary 118 (H) 65 - 99 mg/dL   Comment 1 Notify RN    Comment 2 Document in Chart   CBC     Status: Abnormal   Collection Time: 10/11/15  3:57 AM  Result Value Ref Range     WBC 8.0 4.0 - 10.5 K/uL   RBC 4.34 3.87 - 5.11 MIL/uL   Hemoglobin 9.1 (L) 12.0 - 15.0 g/dL   HCT 32.6 (L) 36.0 - 46.0 %   MCV 75.1 (L) 78.0 - 100.0 fL   MCH 21.0 (L) 26.0 - 34.0 pg   MCHC 27.9 (L) 30.0 - 36.0 g/dL   RDW 17.9 (H) 11.5 - 15.5 %   Platelets 266 150 - 400 K/uL  Basic metabolic panel     Status: Abnormal   Collection Time: 10/11/15  3:57 AM  Result Value Ref Range   Sodium 139 135 - 145 mmol/L   Potassium 4.1 3.5 - 5.1 mmol/L   Chloride 102 101 - 111 mmol/L   CO2 32 22 - 32 mmol/L   Glucose, Bld 104 (H) 65 - 99 mg/dL   BUN 14 6 - 20 mg/dL   Creatinine, Ser 0.94 0.44 - 1.00 mg/dL   Calcium 9.3 8.9 - 10.3 mg/dL   GFR calc non Af Amer >60 >60 mL/min   GFR calc Af Amer >60 >60 mL/min    Comment: (NOTE) The eGFR has been calculated using the CKD EPI equation. This calculation has not been validated in all clinical situations. eGFR's persistently <60 mL/min signify possible Chronic Kidney Disease.    Anion gap 5 5 - 15  Glucose, capillary     Status: Abnormal   Collection Time: 10/11/15  6:07 AM  Result Value Ref Range   Glucose-Capillary 103 (H) 65 - 99 mg/dL   Comment 1 Notify RN    Comment 2 Document in Chart   Glucose, capillary     Status: Abnormal   Collection Time: 10/11/15 11:27 AM  Result Value Ref Range   Glucose-Capillary 131 (H) 65 - 99 mg/dL  Glucose, capillary     Status: Abnormal   Collection Time: 10/11/15  4:45 PM  Result Value Ref Range   Glucose-Capillary 111 (H) 65 - 99 mg/dL  Glucose, capillary     Status: Abnormal   Collection Time: 10/11/15  9:16 PM  Result Value Ref Range   Glucose-Capillary 111 (H) 65 - 99 mg/dL   Comment 1 Notify RN    Comment 2 Document in Chart   CBC     Status: Abnormal   Collection Time: 10/12/15  2:38 AM  Result Value Ref Range   WBC 6.4 4.0 - 10.5 K/uL   RBC 4.54 3.87 - 5.11 MIL/uL   Hemoglobin 9.5 (L) 12.0 - 15.0 g/dL   HCT 33.8 (L) 36.0 - 46.0 %   MCV 74.4 (L) 78.0 - 100.0 fL   MCH 20.9 (L) 26.0 -  34.0 pg   MCHC 28.1 (L) 30.0 - 36.0 g/dL   RDW 17.8 (H) 11.5 - 15.5 %   Platelets 290 150 - 400 K/uL  Basic metabolic panel     Status: Abnormal   Collection Time: 10/12/15  2:38 AM  Result Value Ref Range   Sodium 139 135 - 145 mmol/L   Potassium 4.4 3.5 - 5.1 mmol/L   Chloride 103 101 - 111 mmol/L   CO2 32 22 - 32 mmol/L   Glucose, Bld 102 (H) 65 - 99 mg/dL   BUN 15 6 - 20  mg/dL   Creatinine, Ser 0.97 0.44 - 1.00 mg/dL   Calcium 9.6 8.9 - 10.3 mg/dL   GFR calc non Af Amer >60 >60 mL/min   GFR calc Af Amer >60 >60 mL/min    Comment: (NOTE) The eGFR has been calculated using the CKD EPI equation. This calculation has not been validated in all clinical situations. eGFR's persistently <60 mL/min signify possible Chronic Kidney Disease.    Anion gap 4 (L) 5 - 15  Glucose, capillary     Status: Abnormal   Collection Time: 10/12/15  6:25 AM  Result Value Ref Range   Glucose-Capillary 113 (H) 65 - 99 mg/dL   Comment 1 Notify RN    Comment 2 Document in Chart   Glucose, capillary     Status: Abnormal   Collection Time: 10/12/15 10:35 AM  Result Value Ref Range   Glucose-Capillary 116 (H) 65 - 99 mg/dL   Comment 1 Notify RN    Comment 2 Document in Chart    No results found.     Medical Problem List and Plan: 1.  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  DVT Prophylaxis/Anticoagulation: Pharmaceutical: Lovenox 3. Pain Management: tylenol prn for knee pain.  4. Mood: LCSW to follow for evaluation and support.  5. Neuropsych: This patient is capable of making decisions on her own behalf. 6. Skin/Wound Care:  Routine pressure relief measures 7. Fluids/Electrolytes/Nutrition: Monitor I/O. Check lytes in am.  8. HTN: Monitor bid. Continue catapres, hygroton, and metoprolol 9. Iron deficiency anemia: Add iron supplement.  10. New onset seizures: On Keppra bid.  11. Diarrhea: discontinue laxatives.  12.  Prediabetes/Morbid obesity:  Encourage weight loss. Will consult dietician to help educate on diet.  13. Dizziness:   Check orthostatic vitals.    Post Admission Physician Evaluation: 1. Functional deficits secondary  to right temporal intracranial hemorrhage. 2. Patient is admitted to receive collaborative, interdisciplinary care between the physiatrist, rehab nursing staff, and therapy team. 3. Patient's level of medical complexity and substantial therapy needs in context of that medical necessity cannot be provided at a lesser intensity of care such as a SNF. 4. Patient has experienced substantial functional loss from his/her baseline which was documented above under the "Functional History" and "Functional Status" headings.  Judging by the patient's diagnosis, physical exam, and functional history, the patient has potential for functional progress which will result in measurable gains while on inpatient rehab.  These gains will be of substantial and practical use upon discharge  in facilitating mobility and self-care at the household level. 5. Physiatrist will provide 24 hour management of medical needs as well as oversight of the therapy plan/treatment and provide guidance as appropriate regarding the interaction of the two. 6. 24 hour rehab nursing will assist with bladder management, bowel management, safety, skin/wound care, disease management, medication administration, pain management and patient education  and help integrate therapy concepts, techniques,education, etc. 7. PT will assess and treat for/with: pre gait, gait training, endurance , safety, equipment, neuromuscular re education.   Goals are: Modified independent. 8. OT will assess and treat for/with: ADLs, Cognitive perceptual skills, Neuromuscular re education, safety, endurance, equipment.   Goals are: Mod I. Therapy may proceed with showering this patient. 9. SLP will assess and treat for/with: NA.  Goals are: NA. 10. Case  Management and Social Worker will assess and treat for psychological issues and discharge planning. 11. Team conference will be held weekly to assess progress toward  goals and to determine barriers to discharge. 12. Patient will receive at least 3 hours of therapy per day at least 5 days per week. 13. ELOS: 7-10 days       14. Prognosis:  excellent     Charlett Blake M.D. [facility] FAAPM&R (Sports Med, Neuromuscular Med) Diplomate [place] Board of Electrodiagnostic Med   10/12/2015

## 2015-10-13 NOTE — Progress Notes (Signed)
Patient information reviewed and entered into eRehab system by Amarilis Belflower, RN, CRRN, PPS Coordinator.  Information including medical coding and functional independence measure will be reviewed and updated through discharge.    

## 2015-10-13 NOTE — Evaluation (Signed)
Physical Therapy Assessment and Plan  Patient Details  Name: [name] MRN: [id number] Date of Birth: April 11, 1960  PT Diagnosis: Abnormality of gait, Difficulty walking, Dizziness and giddiness and Muscle weakness Rehab Potential: Good ELOS: 7-10 days    Today's Date: 10/13/2015 PT Individual Time:   800-901 AND 1401-1502 Calculated individual PT treatment time: 61 min and 61 min    Problem List:  Patient Active Problem List   Diagnosis Date Noted  . Diastolic dysfunction   . Tachypnea   . AKI (acute kidney injury) ([place])   . Acute blood loss anemia   . Lethargy   . Wheezing   . Essential hypertension   . ICH (intracerebral hemorrhage) ([place]) 10/06/2015    Past Medical History:  Past Medical History  Diagnosis Date  . Hypertension   . Iron deficiency anemia    Past Surgical History:  Past Surgical History  Procedure Laterality Date  . Cardiac catheterization  04/2010    Dr. Terrence Dupont    Assessment & Plan Clinical Impression: Patient is a 55 year old RH-female with history of morbid obesity, HTN, iron deficiency anemia who was admitted on 10/06/15 with sudden LOC and witnessed seizure activity. She was confused on awakening and was started on Keppra and cleviprex for treatment. 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 NIHSS 0. She did complain of chest pain and CTA chest negative for PE. Cardiology consulted due to elevated troponin and felt that elevation likely due to [place]. She had low risk nuclear stress test in 2012 --does get SOB with mild to moderate activity but no prior hx CAD. Cardiac echo with EF 64-84%, grade 2 diastolic dysfunction and LVH without wall abnormality. Medications adjusted for management fo hypertensive hear diseaseBLE dopplers negative for DVT. MRI/MRA brain with stable right temporal IPH with mild edema and evidence of chronic small micro hemorrhages. Neurology felt that [place] due to uncontrolled HTN and SBP goal <160. Family  concerned about ongoing issues with sedation but Dr. Erlinda Hong recommends keeping higher dose Keppra due to patient seizing on lower dose. Patient with concerns about lethargy due to BP meds and clonidine tapered to help with symptoms.  Patient transferred to CIR on 10/12/2015 .   Patient currently requires min with mobility secondary to muscle weakness, decreased cardiorespiratoy endurance and decreased oxygen support, central origin and peripheral and decreased standing balance and decreased balance strategies.  Prior to hospitalization, patient was modified independent  with mobility and lived with   in a House home.  Home access is 4Stairs to enter.  Patient will benefit from skilled PT intervention to maximize safe functional mobility, minimize fall risk and decrease caregiver burden for planned discharge home with intermittent assist.  Anticipate patient will benefit from follow up [facility] at discharge.  PT - End of Session Activity Tolerance: Tolerates 10 - 20 min activity with multiple rests Endurance Deficit: Yes PT Assessment Rehab Potential (ACUTE/IP ONLY): Good Barriers to Discharge: Inaccessible home environment PT Patient demonstrates impairments in the following area(s): Balance;Motor;Safety PT Transfers Functional Problem(s): Bed Mobility;Bed to Chair;Car;Furniture;Floor PT Locomotion Functional Problem(s): Ambulation;Wheelchair Mobility;Stairs PT Plan PT Intensity: Minimum of 1-2 x/day ,45 to 90 minutes PT Frequency: 5 out of 7 days PT Duration Estimated Length of Stay: 7-10 days  PT Treatment/Interventions: Ambulation/gait training;Balance/vestibular training;Cognitive remediation/compensation;Community reintegration;Discharge planning;Disease management/prevention;DME/adaptive equipment instruction;Neuromuscular re-education;Functional mobility training;Pain management;Patient/family education;Psychosocial support;Skin care/wound management;Splinting/orthotics;Stair training;Therapeutic  Exercise;UE/LE Strength taining/ROM;UE/LE Coordination activities;Therapeutic Activities;Visual/perceptual remediation/compensation;Wheelchair propulsion/positioning PT Transfers Anticipated Outcome(s): Mod I with LRAD PT Locomotion Anticipated  Outcome(s): Supervision with LRAD.  PT Recommendation Follow Up Recommendations: Home health PT Patient destination: Home Equipment Recommended: Rolling walker with 5" wheels     Skilled Therapeutic Intervention Patient received sitting EOB. PT performed Evaluation and initiated treatment intervention. See below for results.   PT instructed patient in Car transfer with min A from sedan height. PT provided mod cues for proper technique and improved positioning to allow improved movement of BLE into and out of car. Following car transfer patient reports pulsing pain on R superrior region of head. BP assessed 169/92. RN made aware of increased BP.   See below for gait and transfers  Patient left sitting in [facility] with call bell in reach and all needs met.   Session 2.   Gait for 29f with RW without O2, desat to 84%, 1 minute to increase >88%. Gait training for 332fx 2 without supplemental O2 with patient focused on pursed lip breathing; patient remained >90% SpO2.  PT instructed patient in gait without stable gaze with head turns and nods with RW. Patient noted significant increase in dizziness with any head movement out of midline. PT instructed patient in gaze stabillization strategies while ambulating and with bed mobility to deminish effect of increased dizziness with mobility.   Gait over uneven surface 1075fith min A from PT as well as mod cues for AD management.   PT instructed patient in Berg balance assessment.  Patient demonstrates increased fall risk as noted by score of  35 /56 on Berg Balance Scale.  (<36= high risk for falls, close to 100%; 37-45 significant >80%; 46-51 moderate >50%; 52-55 lower >25%)   Patient returned to room with call  bell in reach and all needs met.    PT Evaluation Precautions/Restrictions Precautions Precautions: Fall Restrictions Weight Bearing Restrictions: No General Chart Reviewed: Yes Additional Pertinent History: HTN  Vital SignsTherapy Vitals Temp: 98.6 F (37 C) Temp Source: Oral Pulse Rate: 70 Resp: (!) 22 BP: (!) 144/83 mmHg Patient Position (if appropriate): Lying Oxygen Therapy SpO2: 100 % O2 Device: Nasal Cannula O2 Flow Rate (L/min): 2 L/min Pain Pain Assessment Pain Assessment: No/denies pain Pain Score: 0-No pain Home Living/Prior Functioning Home Living Available Help at Discharge: Family;Available 24 hours/day Type of Home: House Home Access: Stairs to enter [employer] Steps: 4 Entrance Stairs-Rails: Right;Left;Can reach both Home Layout: One level Bathroom Shower/Tub: TubChiropodisttandard Prior Function Level of Independence: Independent with basic ADLs  Able to Take Stairs?: Yes Driving: Yes Vocation: Full time employment Vocation Requirements: slight lifting and walking.  Comments: working as nurPsychologist, counsellingsion/Perception    Patient reports blurred vision.  Nystagmus noted at end range for R lateral tracking, not noted on L side. Significant increase in dizziness with head turns, but no nystagmus noted.  Cognition Orientation Level: Oriented X4 Memory: Impaired Memory Impairment: Decreased recall of new information Awareness: Appears intact Problem Solving: Appears intact Safety/Judgment: Impaired Sensation Sensation Light Touch: Appears Intact Proprioception: Appears Intact Coordination Gross Motor Movements are Fluid and Coordinated: Yes Fine Motor Movements are Fluid and Coordinated: Yes Motor  Motor Motor: Within Functional Limits Motor - Skilled Clinical Observations: Generalized strength deficit  Mobility Bed Mobility Bed Mobility: Rolling Right;Rolling Left;Supine to Sit;Sit to  Supine Rolling Right: 4: Min guard Rolling Left: 4: Min guard Supine to Sit: 4: Min assist Sit to Supine: 4: Min assist Transfers Transfers: Yes Sit to Stand: 5: Supervision Stand Pivot Transfers: 4: Min assPsychologist, occupationaltails:  Verbal cues for precautions/safety Locomotion  Ambulation Ambulation: Yes Ambulation/Gait Assistance: 4: Min assist Ambulation Distance (Feet): 95 Feet Assistive device: Rolling walker Ambulation/Gait Assistance Details: Verbal cues for precautions/safety Gait Gait: Yes Gait Pattern: Impaired Gait Pattern: Shuffle;Decreased step length - right;Decreased step length - left Stairs / Additional Locomotion Stairs: Yes Stairs Assistance: 4: Min assist Stair Management Technique: Two rails Number of Stairs: 8 Height of Stairs: 6  Trunk/Postural Assessment     Balance Balance Balance Assessed: Yes Static Sitting Balance Static Sitting - Level of Assistance: 6: Modified independent (Device/Increase time) Dynamic Sitting Balance Dynamic Sitting - Level of Assistance: 5: Stand by assistance Static Standing Balance Static Standing - Level of Assistance: 5: Stand by assistance Dynamic Standing Balance Dynamic Standing - Level of Assistance: 4: Min assist Extremity Assessment      RLE Assessment RLE Assessment: Exceptions to [facility] (Grossly 4+/5 throughout LE. mild decreased ROM with hip flexion due to body habitus. ) LLE Assessment LLE Assessment: Exceptions to [facility] (Grossly 4+/5 throughout LE. mild decreased ROM with hip flexion due to body habitus)   See Function Navigator for Current Functional Status.   Refer to Care Plan for Long Term Goals  Recommendations for other services: None  Discharge Criteria: Patient will be discharged from PT if patient refuses treatment 3 consecutive times without medical reason, if treatment goals not met, if there is a change in medical status, if patient makes no progress towards goals or if patient is  discharged from hospital.  The above assessment, treatment plan, treatment alternatives and goals were discussed and mutually agreed upon: by patient and by family  Lorie Phenix 10/13/2015, 9:06 AM

## 2015-10-13 NOTE — Progress Notes (Addendum)
[place] PHYSICAL MEDICINE & REHABILITATION     PROGRESS NOTE  Subjective/Complaints:  Pt sitting up at the edge of her bed.  She had difficulty initially sleeping, but then slept well.  She is upset because she had some incontinence overnight.   ROS: [name] CP, SOB, N/V/D.  Objective: Vital Signs: Blood pressure 144/83, pulse 70, temperature 98.6 F (37 C), temperature source Oral, resp. rate 22, last menstrual period 02/17/2011, SpO2 100 %. No results found.  Recent Labs  10/12/15 0238 10/13/15 0525  WBC 6.4 6.4  HGB 9.5* 9.5*  HCT 33.8* 32.4*  PLT 290 304    Recent Labs  10/12/15 0238 10/13/15 0525  NA 139 139  K 4.4 3.4*  CL 103 100*  GLUCOSE 102* 95  BUN 15 12  CREATININE 0.97 0.92  CALCIUM 9.6 9.4   CBG (last 3)   Recent Labs  10/12/15 1644 10/12/15 2112 10/13/15 0646  GLUCAP 97 112* 90    Wt Readings from Last 3 Encounters:  10/06/15 120.8 kg (266 lb 5.1 oz)    Physical Exam:  BP 144/83 mmHg  Pulse 70  Temp(Src) 98.6 F (37 C) (Oral)  Resp 22  SpO2 100%  LMP 02/17/2011 Constitutional: She appears well-developed and well-nourished. Nasal cannula in place. Morbidly obese.  HENT: Normocephalic and atraumatic.  Mouth/Throat: Oropharynx is clear and moist.  Eyes: Conjunctivae and EOM are normal. Cardiovascular: Normal rate and regular rhythm.Murmur heard. Respiratory: Effort normal and breath sounds normal. No stridor. No respiratory distress.  GI: Soft. Bowel sounds are normal. She exhibits no distension. There is no tenderness.  Musculoskeletal: She exhibits no edema or tenderness.  Neurological: She is alert and oriented.  Motor strength is 4+/5, bilateral deltoid, biceps, triceps, grip, hip flexor, knee extensors, ankle dorsi/plantar flexors. Skin: Skin is warm and dry.  Psychiatric: She has a normal mood and affect. Her behavior is normal. Thought content normal.    Assessment/Plan: 1. Functional deficits secondary to right temporal  intracranial hemorrhage, complicated by morbid obesity which require 3+ hours per day of interdisciplinary therapy in a comprehensive inpatient rehab setting. Physiatrist is providing close team supervision and 24 hour management of active medical problems listed below. Physiatrist and rehab team continue to assess barriers to discharge/monitor patient progress toward functional and medical goals.  Function:  Bathing Bathing position      Bathing parts      Bathing assist        Upper Body Dressing/Undressing Upper body dressing                    Upper body assist        Lower Body Dressing/Undressing Lower body dressing                                  Lower body assist        Toileting Toileting          Toileting assist     Transfers Chair/bed transfer             Locomotion Ambulation           Wheelchair          Cognition Comprehension    Expression    Social Interaction    Problem Solving    Memory      Medical Problem List and Plan: 1. Gait disorder secondary to right temporal intracranial hemorrhage, complicated by  morbid obesity  Begin CIR 2. DVT Prophylaxis/Anticoagulation: Pharmaceutical: Lovenox 3. Pain Management: tylenol prn for knee pain.  4. Mood: LCSW to follow for evaluation and support.  5. Neuropsych: This patient is capable of making decisions on her own behalf. 6. Skin/Wound Care: Routine pressure relief measures 7. Fluids/Electrolytes/Nutrition: Monitor I/O.   Mild hypokalemia: Will cont to monitor 8. HTN: Monitor bid. Continue catapres, hygroton, and metoprolol  Stable at present 9. Iron deficiency anemia: Added iron supplement.   Hb 9.5 on 7/21 10. New onset seizures: Cont Keppra bid.  11. Diarrhea: discontinued laxatives.  12. Prediabetes/Morbid obesity: Encourage weight loss. Will consult dietician to help educate on diet.  13. Dizziness:Check orthostatic vitals.  14. Supplemental  Oxygen dependent: Will wean as tolerated  LOS (Days) 1 A FACE TO FACE EVALUATION WAS PERFORMED  [name] [name] 10/13/2015 9:22 AM

## 2015-10-13 NOTE — Progress Notes (Signed)
Moderate amount of bright red blood in pt's urine. Small amount also noted on tissue paper from wiping. Pt stated that this was the color of her urine prior to admission. Husband also concerned about how drowsy, pt get after Keppra is administered. Algis Liming, PA made aware. Per Pam, place collection container in toilet for urine collection. Also, agreeable to changing Keppra dosage time from 8A/8P to 6A/ 6P to see if this will alleviate some of husband's concern about drowsiness, and pt possibly missing therapy sessions.

## 2015-10-13 NOTE — IPOC Note (Addendum)
Overall Plan of Care [facility]) Patient Details Name: [name] MRN: [id number] DOB: June 02, 1960  Admitting Diagnosis: [facility] Problems: Active Problems:   ICH (intracerebral hemorrhage) (H[facility])   Hypokalemia   Anemia, iron deficiency   Super obese ([facility])   Prediabetes   Orthostatic hypotension   Supplemental oxygen dependent     Functional Problem List: Nursing    PT Balance, Motor, Safety  OT Balance, Cognition, Edema, Endurance, Motor, Pain, Perception, Safety, Sensory, Skin Integrity, Vision  SLP    TR         Basic ADL's: OT Eating, Grooming, Bathing, Dressing, Toileting     Advanced  ADL's: OT       Transfers: PT Bed Mobility, Bed to Chair, Car, [employer], Futures trader, Metallurgist: PT Ambulation, Emergency planning/management officer, Stairs     Additional Impairments: OT Fuctional Use of Upper Extremity  SLP        TR      Anticipated Outcomes Item Anticipated Outcome  Self Feeding independent  Swallowing      Basic self-care  mod I  Toileting  mod I   Bathroom Transfers mod I  Bowel/Bladder     Transfers  Mod I with LRAD  Locomotion  Supervision with LRAD.   Communication     Cognition     Pain     Safety/Judgment      Therapy Plan: PT Intensity: Minimum of 1-2 x/day ,45 to 90 minutes PT Frequency: 5 out of 7 days PT Duration Estimated Length of Stay: 7-10 days  OT Intensity: Minimum of 1-2 x/day, 45 to 90 minutes OT Frequency: 5 out of 7 days OT Duration/Estimated Length of Stay: 7-10 days         Team Interventions: Nursing Interventions    PT interventions Ambulation/gait training, Training and development officer, Cognitive remediation/compensation, Community reintegration, Discharge planning, Disease management/prevention, DME/adaptive equipment instruction, Neuromuscular re-education, Functional mobility training, Pain management, Patient/family education, Psychosocial support, Skin care/wound management,  Splinting/orthotics, Stair training, Therapeutic Exercise, UE/LE Strength taining/ROM, UE/LE Coordination activities, Therapeutic Activities, Visual/perceptual remediation/compensation, Wheelchair propulsion/positioning  OT Interventions Training and development officer, Cognitive remediation/compensation, Academic librarian, Discharge planning, Disease mangement/prevention, Engineer, drilling, Functional mobility training, Neuromuscular re-education, Pain management, Patient/family education, Psychosocial support, Self Care/advanced ADL retraining, Skin care/wound managment, Therapeutic Exercise, Visual/perceptual remediation/compensation, Wheelchair propulsion/positioning, UE/LE Coordination activities, UE/LE Strength taining/ROM, Therapeutic Activities  SLP Interventions    TR Interventions    SW/CM Interventions Discharge Planning, Psychosocial Support, Patient/Family Education    Team Discharge Planning: Destination: PT-Home ,OT- Home , SLP-  Projected Follow-up: PT-Home health PT, OT-  Other (comment) (TBD), SLP-  Projected Equipment Needs: PT-Rolling walker with 5" wheels, OT- Other (comment) (TBD), SLP-  Equipment Details: PT- , OT-  Patient/family involved in discharge planning: PT- Patient,  OT-Patient, Family member/caregiver, SLP-   MD ELOS: 7-10 days. Medical Rehab Prognosis:  Good Assessment: 55 year old RH-female with history of morbid obesity, HTN, iron deficiency anemia who was admitted on 10/06/15 with sudden LOC and witnessed seizure activity. She was confused on awakening and was started on Keppra and cleviprex for treatment. 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 NIHSS 0. She did complain of chest pain and CTA chest negative for PE. Cardiology consulted due to elevated troponin and felt that elevation likely due to [place]. She had low risk nuclear stress test in 2012 --does get SOB with mild to moderate activity but no prior hx CAD. Cardiac  echo with EF 60-65%,  grade 2 diastolic dysfunction and LVH without wall abnormality. Medications adjusted for management fo hypertensive hear diseaseBLE dopplers negative for DVT. MRI/MRA brain with stable right temporal IPH with mild edema and evidence of chronic small micro hemorrhages. Neurology felt that [place] due to uncontrolled HTN and SBP goal <160. Family concerned about ongoing issues with sedation but Dr. Erlinda Hong recommends keeping higher dose Keppra due to patient seizing on lower dose. Patient with concerns about lethargy due to BP meds and clonidine tapered to help with symptoms. Therapy initiated and patient limited by dizziness, intermittent nausea and balance deficits. Will set goals for Mod I with therapies.   See Team Conference Notes for weekly updates to the plan of care

## 2015-10-13 NOTE — Evaluation (Signed)
Occupational Therapy Assessment and Plan & Session Note   Patient Details  Name: [name] MRN: [id number] Date of Birth: [birth year]  OT Diagnosis: abnormal posture, acute pain, cognitive deficits, disturbance of vision and muscle weakness (generalized) Rehab Potential: Rehab Potential (ACUTE ONLY): Good ELOS: 7-10 days   Today's Date: 10/13/2015 OT Individual Time: 1005-1105 OT Individual Time Calculation (min): 60 min    Missed Time: 30 minutes secondary too lethargic to safely continue session  Problem List:  Patient Active Problem List   Diagnosis Date Noted  . Hypokalemia   . Anemia, iron deficiency   . Super obese ([place])   . Prediabetes   . Orthostatic hypotension   . Supplemental oxygen dependent   . Diastolic dysfunction   . Tachypnea   . AKI (acute kidney injury) ([place])   . Acute blood loss anemia   . Lethargy   . Wheezing   . Essential hypertension   . ICH (intracerebral hemorrhage) ([place]) 10/06/2015    Past Medical History:  Past Medical History  Diagnosis Date  . Hypertension   . Iron deficiency anemia    Past Surgical History:  Past Surgical History  Procedure Laterality Date  . Cardiac catheterization  04/2010    Dr. Terrence Dupont    Assessment & Plan Clinical Impression: [name] is a 55 y.o. right handed female with history of hypertension. Per chart review patient lives with spouse and family. One level home with several entry steps. 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 Admitted 10/06/2015 with loss of consciousness and generalized seizure. Cranial CT scan showed acute right posterior parietal intracerebral hemorrhage measuring 19 x 18 x 13 mm without significant mass effect. CT of the chest with no pulmonary emboli. CT angiogram head and neck negative for dural venous sinus thrombosis. Negative arterial findings. Echocardiogram with ejection fraction 19% grade 2 diastolic dysfunction. Elevated troponin 0.09 with cardiology  services consulted at this time recommended conservative management. Follow-up MRI of the brain 10/10/2015 stable appearance. EEG showed no seizure activity. Nonspecific diffuse cerebral dysfunction. Maintained on Keppra for seizure prophylaxis. Subcutaneous heparin added for DVT prophylaxis 10/09/2015. Tolerating a regular diet. Patient transferred to CIR on 10/12/2015 .    Patient currently requires min to mod assist with basic self-care skills and IADL secondary to muscle weakness and muscle joint tightness and decreased standing balance, decreased postural control and decreased balance strategies. Prior to hospitalization, patient could complete ADLs with min assist (husband assisting with socks & shoes), but overall independent with functional ambulation and mobility.   Patient will benefit from skilled intervention to increase independence with basic self-care skills prior to discharge home with care partner.  Anticipate patient will require intermittent supervision and additional OT is TBD.  OT - End of Session Activity Tolerance: Tolerates 10 - 20 min activity with multiple rests Endurance Deficit: Yes Endurance Deficit Description: limited by lethargy due to keppra? this session OT Assessment Rehab Potential (ACUTE ONLY): Good Barriers to Discharge:  (none known at this time) OT Patient demonstrates impairments in the following area(s): Balance;Cognition;Edema;Endurance;Motor;Pain;Perception;Safety;Sensory;Skin Integrity;Vision OT Basic ADL's Functional Problem(s): Eating;Grooming;Bathing;Dressing;Toileting OT Transfers Functional Problem(s): Toilet;Tub/Shower OT Additional Impairment(s): Fuctional Use of Upper Extremity OT Plan OT Intensity: Minimum of 1-2 x/day, 45 to 90 minutes OT Frequency: 5 out of 7 days OT Duration/Estimated Length of Stay: 7-10 days OT Treatment/Interventions: Balance/vestibular training;Cognitive remediation/compensation;Community reintegration;Discharge  planning;Disease mangement/prevention;DME/adaptive equipment instruction;Functional mobility training;Neuromuscular re-education;Pain management;Patient/family education;Psychosocial support;Self Care/advanced ADL retraining;Skin care/wound managment;Therapeutic Exercise;Visual/perceptual remediation/compensation;Wheelchair propulsion/positioning;UE/LE Coordination activities;UE/LE Strength  taining/ROM;Therapeutic Activities OT Self Feeding Anticipated Outcome(s): independent OT Basic Self-Care Anticipated Outcome(s): mod I OT Toileting Anticipated Outcome(s): mod I OT Bathroom Transfers Anticipated Outcome(s): mod I OT Recommendation Patient destination: Home Follow Up Recommendations: Other (comment) (TBD) Equipment Recommended: Other (comment) (TBD)   Skilled Therapeutic Intervention Initial 1:1 OT evaluation completed, but limited due to lethargy. Upon entering room, pt found seated in w/c with husband present. Pt talking with therapist and oriented X4, but with some difficulties with expressing self. Therefore, recommending SLP evaluation. Pt stood with RW and ambulated into BR for toilet transfer. At this time, pt started to become drowsy, but was functional. Pt with blood in urine, notified RN of this. Pt then ambulated to shower and transferred onto tub transfer bench in walk-in shower. At this point, pt with increased drowsiness and barely able to keep eyes open. Clinically did not believe shower was safe due to patient's lethargy. Assisted pt out of shower with mod assist, pt ambulated to bed with min assist, and required mod assist to lay sit to supine. Pt with request for oxygen because she felt out of breath and pt asleep by the time oxygen was placed. RN and scheduling team aware of situation and missed time. Left pt supine in bed with all needs within reach, bed alarm set, and husband present at bedside.    OT Evaluation Precautions/Restrictions  Precautions Precautions:  Fall Restrictions Weight Bearing Restrictions: No   General Chart Reviewed: No OT Amount of Missed Time: 30 Minutes Family/Caregiver Present: Yes (Husband, Russell)     Pain Pain Assessment Pain Assessment: 0-10 Pain Score: 5  Pain Type: Acute pain Pain Location: Head Pain Orientation: Mid;Medial Pain Descriptors / Indicators: Headache Pain Intervention(s): RN made aware (RN administerred pain meds prior to session)   Home Living/Prior Functioning Home Living Available Help at Discharge: Family, Available 24 hours/day Type of Home: House Home Access: Stairs to enter [employer] of Steps: 4 Entrance Stairs-Rails: Right, Left, Can reach both Home Layout: One level Bathroom Shower/Tub: Tub/shower unit, Architectural technologist: Standard  Lives With: Spouse IADL History Homemaking Responsibilities: No Occupation: Full time employment Type of Occupation: Pt works 2 jobs as a Programmer, 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  Comments: working as Museum/gallery curator- History Baseline Vision/History: Wears glasses Wears Glasses: Distance only Patient Visual Report: Blurring of vision ("when I get woozy") Vision- Assessment Vision Assessment?: Vision impaired- to be further tested in functional context Additional Comments: Pt reports that she feels there is a change because her glassess tend to bother her now. Will further assess vision in functional context. Pt with no complaints of blurring of vision except when she feels woozy, according to her.  Perception Comments: will further assess   Cognition Overall Cognitive Status: Within Functional Limits for tasks assessed Arousal/Alertness: Awake/alert Orientation Level:  Person;Place;Situation Person: Oriented Place: Oriented Situation: Oriented Year: 2017 Month: July Day of Week: Correct Memory: Impaired Memory Impairment: Decreased recall of new information Immediate Memory Recall: Sock;Blue;Bed Attention: Selective Selective Attention: Appears intact Awareness: Appears intact Problem Solving: Appears intact Safety/Judgment: Impaired   Sensation Sensation Light Touch: Appears Intact Proprioception: Appears Intact Coordination Gross Motor Movements are Fluid and Coordinated: Yes Fine Motor Movements are Fluid and Coordinated: Yes   Motor  Motor Motor: Within Functional  Limits Motor - Skilled Clinical Observations: Generalized strength deficit   Mobility  - See PT evaluation  Trunk/Postural Assessment  Cervical Assessment Cervical Assessment: Within Functional Limits Thoracic Assessment Thoracic Assessment: Within Functional Limits Lumbar Assessment Lumbar Assessment: Within Functional Limits Postural Control Postural Control: Within Functional Limits    Balance Balance Balance Assessed: Yes Static Sitting Balance Static Sitting - Level of Assistance: 6: Modified independent (Device/Increase time) Dynamic Sitting Balance Dynamic Sitting - Level of Assistance: 5: Stand by assistance Static Standing Balance Static Standing - Level of Assistance: 5: Stand by assistance Dynamic Standing Balance Dynamic Standing - Level of Assistance: 4: Min assist   Extremity/Trunk Assessment RUE Assessment RUE Assessment: Within Functional Limits (can benefit from functional RUE HEP) LUE Assessment LUE Assessment: Within Functional Limits (can benefit from functional LUE HEP)   See Function Navigator for Current Functional Status.   Refer to Care Plan for Long Term Goals  Recommendations for other services: Other: Speech Evaluation and Vestibular evaluation  Discharge Criteria: Patient will be discharged from OT if patient refuses  treatment 3 consecutive times without medical reason, if treatment goals not met, if there is a change in medical status, if patient makes no progress towards goals or if patient is discharged from hospital.  The above assessment, treatment plan, treatment alternatives and goals were discussed and mutually agreed upon: by patient and by family  Chrys Racer , MS, OTR/L, CLT  10/13/2015, 1:25 PM

## 2015-10-13 NOTE — Plan of Care (Signed)
Problem: Food- and Nutrition-Related Knowledge Deficit (NB-1.1) Goal: Nutrition education Formal process to instruct or train a patient/client in a skill or to impart knowledge to help patients/clients voluntarily manage or modify food choices and eating behavior to maintain or improve health. Outcome: Completed/Met Date Met:  10/13/15  RD consulted for nutrition education regarding carbohydrate modified diet and weight loss.     Lab Results  Component Value Date    HGBA1C 5.7* 10/07/2015    RD provided "Carbohydrate Counting for People with Diabetes" and "Weight Loss Tips" handout from the Academy of Nutrition and Dietetics. Discussed different food groups and their effects on blood sugar, emphasizing carbohydrate-containing foods. Provided list of carbohydrates and recommended serving sizes of common foods.  Discussed importance of controlled and consistent carbohydrate intake throughout the day. Provided examples of ways to balance meals/snacks and encouraged intake of high-fiber, whole grain complex carbohydrates. Pt reports usually drinking large amounts of Mountain Dew and sweet tea. Discussed diabetic friendly drink options. Pt reports disliking diet drinks however is open to trying splenda to sweeten drinks.Teach back method used.  Expect fair compliance.  Body Mass Index: 53.9 kg/(m^2). Pt meets criteria for morbid obesity based on current BMI.  Current diet order is carbohydrate modifed, patient is consuming approximately 100% of meals at this time. Labs and medications reviewed. No further nutrition interventions warranted at this time. RD contact information provided. If additional nutrition issues arise, please re-consult RD.  Corrin Parker, MS, RD, LDN Pager # [phone] After hours/ weekend pager # [phone]

## 2015-10-14 ENCOUNTER — Inpatient Hospital Stay (HOSPITAL_COMMUNITY): Payer: Self-pay | Admitting: Physical Therapy

## 2015-10-14 DIAGNOSIS — R32 Unspecified urinary incontinence: Secondary | ICD-10-CM

## 2015-10-14 LAB — CBC
HEMATOCRIT: 36.2 % (ref 36.0–46.0)
HEMOGLOBIN: 10.6 g/dL — AB (ref 12.0–15.0)
MCH: 21.1 pg — ABNORMAL LOW (ref 26.0–34.0)
MCHC: 29.3 g/dL — ABNORMAL LOW (ref 30.0–36.0)
MCV: 72 fL — ABNORMAL LOW (ref 78.0–100.0)
Platelets: 349 10*3/uL (ref 150–400)
RBC: 5.03 MIL/uL (ref 3.87–5.11)
RDW: 17.7 % — ABNORMAL HIGH (ref 11.5–15.5)
WBC: 6.1 10*3/uL (ref 4.0–10.5)

## 2015-10-14 LAB — GLUCOSE, CAPILLARY
GLUCOSE-CAPILLARY: 106 mg/dL — AB (ref 65–99)
GLUCOSE-CAPILLARY: 108 mg/dL — AB (ref 65–99)
GLUCOSE-CAPILLARY: 119 mg/dL — AB (ref 65–99)
Glucose-Capillary: 113 mg/dL — ABNORMAL HIGH (ref 65–99)

## 2015-10-14 LAB — URINALYSIS, ROUTINE W REFLEX MICROSCOPIC
Bilirubin Urine: NEGATIVE
Glucose, UA: NEGATIVE mg/dL
Ketones, ur: NEGATIVE mg/dL
NITRITE: NEGATIVE
PROTEIN: NEGATIVE mg/dL
SPECIFIC GRAVITY, URINE: 1.007 (ref 1.005–1.030)
pH: 6.5 (ref 5.0–8.0)

## 2015-10-14 LAB — URINE MICROSCOPIC-ADD ON

## 2015-10-14 MED ORDER — POTASSIUM CHLORIDE CRYS ER 10 MEQ PO TBCR
10.0000 meq | EXTENDED_RELEASE_TABLET | Freq: Every day | ORAL | Status: DC
  Administered 2015-10-14 – 2015-10-16 (×3): 10 meq via ORAL
  Filled 2015-10-14 (×3): qty 1

## 2015-10-14 MED ORDER — DOCUSATE SODIUM 100 MG PO CAPS
100.0000 mg | ORAL_CAPSULE | Freq: Two times a day (BID) | ORAL | Status: DC
  Filled 2015-10-14 (×4): qty 1

## 2015-10-14 NOTE — Progress Notes (Signed)
[facility] PHYSICAL MEDICINE & REHABILITATION     PROGRESS NOTE  Subjective/Complaints:  C/o bladder incont, no dysuria Bowels are cont one bloody stool (pt states laxatives have caused this in the past), subsequent stools reported as normal  ROS: [name] CP, SOB, N/V/D.  Objective: Vital Signs: Blood pressure 164/75, pulse 81, temperature 98.3 F (36.8 C), temperature source Oral, resp. rate 20, weight 119.75 kg (264 lb), last menstrual period 02/17/2011, SpO2 96 %. No results found.  Recent Labs  10/13/15 0525 10/14/15 0435  WBC 6.4 6.1  HGB 9.5* 10.6*  HCT 32.4* 36.2  PLT 304 349    Recent Labs  10/12/15 0238 10/13/15 0525  NA 139 139  K 4.4 3.4*  CL 103 100*  GLUCOSE 102* 95  BUN 15 12  CREATININE 0.97 0.92  CALCIUM 9.6 9.4   CBG (last 3)   Recent Labs  10/13/15 1620 10/13/15 2036 10/14/15 0706  GLUCAP 97 107* 106*    Wt Readings from Last 3 Encounters:  10/14/15 119.75 kg (264 lb)  10/06/15 120.8 kg (266 lb 5.1 oz)    Physical Exam:  BP 164/75 mmHg  Pulse 81  Temp(Src) 98.3 F (36.8 C) (Oral)  Resp 20  Wt 119.75 kg (264 lb)  SpO2 96%  LMP 02/17/2011 Constitutional: She appears well-developed and well-nourished. Nasal cannula in place. Morbidly obese.  HENT: Normocephalic and atraumatic.  Mouth/Throat: Oropharynx is clear and moist.  Eyes: Conjunctivae and EOM are normal. Cardiovascular: Normal rate and regular rhythm.Murmur heard. Respiratory: Effort normal and breath sounds normal. No stridor. No respiratory distress.  GI: Soft. Bowel sounds are normal. She exhibits no distension. There is no tenderness.  Musculoskeletal: She exhibits no edema or tenderness.  Neurological: She is alert and oriented.  Motor strength is 4+/5, bilateral deltoid, biceps, triceps, grip, hip flexor, knee extensors, ankle dorsi/plantar flexors. Skin: Skin is warm and dry.  Psychiatric: She has a normal mood and affect. Her behavior is normal. Thought content  normal.    Assessment/Plan: 1. 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 Physiatrist is providing close team supervision and 24 hour management of active medical problems listed below. Physiatrist and rehab team continue to assess barriers to discharge/monitor patient progress toward functional and medical goals.  Function:  Bathing Bathing position Bathing activity did not occur: N/A (unable due to lethargy)    Bathing parts      Bathing assist        Upper Body Dressing/Undressing Upper body dressing Upper body dressing/undressing activity did not occur: N/A (unable due to lethargy)                  Upper body assist        Lower Body Dressing/Undressing Lower body dressing Lower body dressing/undressing activity did not occur: N/A (unable due to lethargy)                                Lower body assist        Toileting Toileting     Toileting steps completed by helper: Adjust clothing prior to toileting, Performs perineal hygiene, Adjust clothing after toileting Toileting Assistive Devices: Grab bar or rail  Toileting assist Assist level: Touching or steadying assistance (Pt.75%)   Transfers Chair/bed transfer   Chair/bed transfer method: Stand pivot Chair/bed transfer assist  level: Touching or steadying assistance (Pt > 75%)       Locomotion Ambulation     Max distance: 18ft Assist level: Touching or steadying assistance (Pt > 75%)   Wheelchair   Type: Manual Max wheelchair distance: 160ft Assist Level: Supervision or verbal cues  Cognition Comprehension Comprehension assist level: Follows basic conversation/direction with extra time/assistive device  Expression Expression assist level: Expresses basic 90% of the time/requires cueing < 10% of the time.  Social Interaction  Social Interaction assist level: Interacts appropriately 90% of the time - Needs monitoring or encouragement for participation or interaction.  Problem Solving Problem solving assist level: Solves basic 90% of the time/requires cueing < 10% of the time  Memory Memory assist level: Recognizes or recalls 75 - 89% of the time/requires cueing 10 - 24% of the time    Medical Problem List and Plan: 1. 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 DVT Prophylaxis/Anticoagulation: Pharmaceutical: Lovenox 3. Pain Management: tylenol prn for knee pain.  4. Mood: LCSW to follow for evaluation and support.  5. Neuropsych: This patient is capable of making decisions on her own behalf. 6. Skin/Wound Care: Routine pressure relief measures 7. Fluids/Electrolytes/Nutrition: Monitor I/O.   Mild hypokalemia: on chlorthalidone, will add KCL 8. HTN: Monitor bid. Continue catapres, hygroton, and metoprolol  Stable at present 9. Iron deficiency anemia: Added iron supplement.   Hb 9.5 on 7/21 10. New onset seizures: Cont Keppra bid.  11. Diarrhea: discontinued laxatives.  12. Prediabetes/Morbid obesity: Encourage weight loss. Will consult dietician to help educate on diet.  13. Dizziness:Check orthostatic vitals.  14. Supplemental Oxygen dependent: Will wean as tolerated 15.Marland Kitchen Urinary incont- check UA Hematochezia16.  recheck CBC, monitor for recurrent, may need to hold lovenox if it recurs LOS (Days) 2 A FACE TO FACE EVALUATION WAS PERFORMED  Alysia Penna E 10/14/2015 7:20 AM

## 2015-10-14 NOTE — Progress Notes (Signed)
Physical Therapy Session Note  Patient Details  Name: [name] MRN: [id number] Date of Birth: 22-Sep-1960  Today's Date: 10/14/2015 PT Individual Time: 1001-1044 PT Individual Time Calculation (min): 43 min   Short Term Goals: Week 1:  PT Short Term Goal 1 (Week 1): STG=LTG due to ELOS  Skilled Therapeutic Interventions/Progress Updates:    Patient received sitting in [facility] and agreeable to PT.   PT instructed patient and husband in upper body and lower body dressing with sit<>stand x2 using RW to don pants and underpants. PT provided min A with increased dizziness while looking down to adjust waist on pants.   PT transported to rehab gym in [facility].  VOR 1 and 2 training with gaze stabilization to target at 33f with head turns for 45 seconds x 2 and head nods 30seconds x 2. PT instructed patient to reduce speed of movement if dizziness present. Patient noted to have difficulty maintaining gaze on target with head nods.   PT instructed patient in gait training for 1145f and 12529fith RW and min A progressing to supervision A from PT. Patient complained of increased dizziness with looking down at RW [facility]. PT instructed patient in gaze stabilization which reduced dizziness in turns and for remainder of gait training.     Patient remained on room air throughout PT treatment and was noted to have one desat<88%  which improved to 93 with pursed lip breathing of 30 seconds.    [facility] and all needs met.     Therapy Documentation Precautions:  Precautions Precautions: Fall Restrictions Weight Bearing Restrictions: No General:   Vital Signs: Therapy Vitals Temp: 98.3 F (36.8 C) Temp Source: Oral Pulse Rate: 71 Resp: 20 BP: (!) 148/71 mmHg Patient Position (if appropriate): Orthostatic Vitals Oxygen Therapy SpO2: 93 % O2 Device: Not Delivered Pain: Pain Assessment Pain Assessment: 0-10 Pain Score: 7  Pain Type: Acute pain Pain  Location: Head Pain Descriptors / Indicators: Headache Pain Frequency: Intermittent Pain Onset: Gradual Pain Intervention(s): Medication (See eMAR) Multiple Pain Sites: No   See Function Navigator for Current Functional Status.   Therapy/Group: Individual Therapy  AusLorie Phenix22/2017, 10:48 AM

## 2015-10-15 ENCOUNTER — Inpatient Hospital Stay (HOSPITAL_COMMUNITY): Payer: Medicaid Other | Admitting: *Deleted

## 2015-10-15 ENCOUNTER — Inpatient Hospital Stay (HOSPITAL_COMMUNITY): Payer: Self-pay | Admitting: Occupational Therapy

## 2015-10-15 LAB — GLUCOSE, CAPILLARY
GLUCOSE-CAPILLARY: 113 mg/dL — AB (ref 65–99)
Glucose-Capillary: 115 mg/dL — ABNORMAL HIGH (ref 65–99)
Glucose-Capillary: 121 mg/dL — ABNORMAL HIGH (ref 65–99)
Glucose-Capillary: 158 mg/dL — ABNORMAL HIGH (ref 65–99)

## 2015-10-15 LAB — URINE CULTURE

## 2015-10-15 NOTE — Progress Notes (Signed)
[place] PHYSICAL MEDICINE & REHABILITATION     PROGRESS NOTE  Subjective/Complaints:  Patient states her nurse noted a hemorrhoid. Patient had a shower today. No further blood in stool. ROS: Negative CP, SOB, N/V/D.  Objective: Vital Signs: Blood pressure 139/79, pulse 69, temperature 98.1 F (36.7 C), temperature source Oral, resp. rate 18, weight 118.9 kg (262 lb 3.2 oz), last menstrual period 02/17/2011, SpO2 93 %. No results found.  Recent Labs  10/13/15 0525 10/14/15 0435  WBC 6.4 6.1  HGB 9.5* 10.6*  HCT 32.4* 36.2  PLT 304 349    Recent Labs  10/13/15 0525  NA 139  K 3.4*  CL 100*  GLUCOSE 95  BUN 12  CREATININE 0.92  CALCIUM 9.4   CBG (last 3)   Recent Labs  10/14/15 1640 10/14/15 2214 10/15/15 0722  GLUCAP 108* 119* 115*    Wt Readings from Last 3 Encounters:  10/15/15 118.9 kg (262 lb 3.2 oz)  10/06/15 120.8 kg (266 lb 5.1 oz)    Physical Exam:  BP 139/79 (BP Location: Left Arm)   Pulse 69   Temp 98.1 F (36.7 C) (Oral)   Resp 18   Wt 118.9 kg (262 lb 3.2 oz)   LMP 02/17/2011   SpO2 93%   BMI 52.96 kg/m  Constitutional: She appears well-developed and well-nourished. Nasal cannula in place. Morbidly obese.  HENT: Normocephalic and atraumatic.  Mouth/Throat: Oropharynx is clear and moist.  Eyes: Conjunctivae and EOM are normal. Cardiovascular: Normal rate and regular rhythm.Murmur heard. Respiratory: Effort normal and breath sounds normal. No stridor. No respiratory distress.  GI: Soft. Bowel sounds are normal. She exhibits no distension. There is no tenderness.  Musculoskeletal: She exhibits no edema or tenderness.  Neurological: She is alert and oriented.  Motor strength is 4+/5, bilateral deltoid, biceps, triceps, grip, hip flexor, knee extensors, ankle dorsi/plantar flexors. Skin: Skin is warm and dry.  Psychiatric: She has a normal mood and affect. Her behavior is normal. Thought content normal.    Assessment/Plan: 1.  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 Physiatrist is providing close team supervision and 24 hour management of active medical problems listed below. Physiatrist and rehab team continue to assess barriers to discharge/monitor patient progress toward functional and medical goals.  Function:  Bathing Bathing position Bathing activity did not occur: N/A (unable due to lethargy)    Bathing parts      Bathing assist        Upper Body Dressing/Undressing Upper body dressing Upper body dressing/undressing activity did not occur: N/A (unable due to lethargy)                  Upper body assist        Lower Body Dressing/Undressing Lower body dressing Lower body dressing/undressing activity did not occur: N/A (unable due to lethargy)                                Lower body assist        Toileting Toileting     Toileting steps completed by helper: Adjust clothing prior to toileting, Performs perineal hygiene, Adjust clothing after toileting Toileting Assistive Devices: Grab bar or rail  Toileting assist Assist level: Touching or steadying assistance (Pt.75%)   Transfers Chair/bed transfer   Chair/bed transfer method: Stand pivot  Chair/bed transfer assist level: Touching or steadying assistance (Pt > 75%)       Locomotion Ambulation     Max distance: 144ft Assist level: Touching or steadying assistance (Pt > 75%)   Wheelchair   Type: Manual Max wheelchair distance: 111ft Assist Level: Supervision or verbal cues  Cognition Comprehension Comprehension assist level: Follows basic conversation/direction with no assist  Expression Expression assist level: Expresses basic needs/ideas: With extra time/assistive device  Social Interaction Social Interaction assist level: Interacts appropriately  90% of the time - Needs monitoring or encouragement for participation or interaction.  Problem Solving Problem solving assist level: Solves basic problems with no assist  Memory Memory assist level: Recognizes or recalls 90% of the time/requires cueing < 10% of the time    Medical Problem List and Plan: 1. 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 DVT Prophylaxis/Anticoagulation: Pharmaceutical: Lovenox 3. Pain Management: tylenol prn for knee pain.  4. Mood: LCSW to follow for evaluation and support.  5. Neuropsych: This patient is capable of making decisions on her own behalf. 6. Skin/Wound Care: Routine pressure relief measures 7. Fluids/Electrolytes/Nutrition: Monitor I/O.   Mild hypokalemia: on chlorthalidone,on KCL, labs in am 8. HTN: Monitor bid. Continue catapres, hygroton, and metoprolol  Stable at present 9. Iron deficiency anemia: Added iron supplement.   Hb 9.5 on 7/21. Hgb 10.6 on 7/22 10. New onset seizures: Cont Keppra bid.  11. Diarrhea: discontinued laxatives.  12. Prediabetes/Morbid obesity: Encourage weight loss. Will consult dietician to help educate on diet.  13. Dizziness:Check orthostatic vitals.  14. Supplemental Oxygen dependent: Will wean as tolerated 15.Marland Kitchen Urinary incont- check UA 16.Hematochezia- likely hemmorhoid      monitor CBC, monitor for recurrent, may need to hold lovenox if it recurs LOS (Days) 3 A FACE TO FACE EVALUATION WAS PERFORMED  Charlett Blake 10/15/2015 9:31 AM

## 2015-10-15 NOTE — Progress Notes (Addendum)
Physical Therapy Session Note  Patient Details  Name: [name] MRN: [id number] Date of Birth: [birth year]  Today's Date: 10/15/2015     Skilled Therapeutic Interventions/Progress Updates: Session I 0930-1030 (60 min )  Session focused on training in gait, transfers and activity tolerance w/o onset of dizziness and vertigo. Gait training 2 x 150 -see below for details. Stair training 2 x 4, ascended step to pattern, descends with min A sideways. Patient needed a rest break on top of stair case due to onset of dizziness-cued to focus gaze on one point.  NuStep 1 x 10 min with one rest break, activity performed in order to increase strength, and reciprocal movements necessary for appropriate gait pattern .  Sit to stand x 5 with Supervision, standing with alternating head movements x 5 for 1 min in order to increase balance. Stepp ups on 4 inch steps , no AD for standing support -min to mod A form therapist due to posterior balance loss.  Dix Hallpike maneuvers in order to decrease symptoms of vertigo.   Session II 1456-1544 (47 min )  Session focused on gait training and navigating in small spaces,including in and out of bathroom around obstacles, no LOB noted during gait, long distance 2 x 150 feet . All gait with RW and Supervision.  Standing and dynamic standing balance training with ball bounces against the wall, no LOB , but frequent breaks needed due to spells of dizziness.  Patient requested to use the bathroom,. Min to mod A needed with don/doff pants due to inability to bend down and pull up pants from floor.  Educated patient on importance of regular healthcare checks, participation in therapy interventions. Patient reported having urge incontinence at night, educated on pelvic floor therapy and possibly asking her primary physician post hospital d/c for a referral to see one.   Therapy Documentation Precautions:  Precautions Precautions: Fall Restrictions Weight Bearing  Restrictions: No Locomotion : Ambulation Ambulation: Yes Ambulation/Gait Assistance: 4: Min guard Ambulation Distance (Feet): 150 Feet Assistive device: Rolling walker Ambulation/Gait Assistance Details: Verbal cues for technique Gait Gait: Yes Gait Pattern: Impaired Gait Pattern: Step-to pattern Gait velocity: decreased Stairs / Additional Locomotion Stairs: Yes Stairs Assistance: 4: Min assist Stairs Assistance Details: Verbal cues for sequencing;Verbal cues for technique Stair Management Technique: Two rails Number of Stairs: 4 Height of Stairs: 6    See Function Navigator for Current Functional Status.   Therapy/Group: Individual Therapy  [name] 10/15/2015, 12:43 PM

## 2015-10-15 NOTE — Progress Notes (Signed)
PRN nose spray given at 0525. Complained of dizziness this am, "room spinning". Ortho vitals with pressures trending up when changing positions. [name] A

## 2015-10-15 NOTE — Progress Notes (Signed)
Occupational Therapy Session Note  Patient Details  Name: [name] MRN: [id number] Date of Birth: September 01, 1960  Today's Date: 10/15/2015 OT Individual Time: [id number] and [id number]  OT Individual Time Calculation (min): 46 min and 60 minutes    Short Term Goals: Week 1:  OT Short Term Goal 1 (Week 1): STGs=LTGs due to ELOS   Skilled Therapeutic Interventions/Progress Updates:   Pt was up in w/c upon skilled OT arrival and agreeable to take shower. Pt reported no dizziness or drowsiness. Pt completed functional transfer from w/c to shower bench with steadying assistance. Pt was provided LH sponge due to difficultly reaching LEs and back and instructed on use during shower. Pt completed UB/LB bathing with steadying assistance during standing for pericare. Pt reported having to void after shower, and completed transfer to toilet with [place]. Pt able to complete all steps of toileitng with balance support. UB/LB dressing completed while on [facility] with instruction on use of sock aide. Pt was able to demonstrate safe use of sock aide to don socks. Pts vision was formally assessed with glasses and no significant deficits in peripheral vision, visual field scanning, or tracking observed. Pt was educated on importance of wearing glasses for ambulating at home and during functional tasks to optimize safety with verbalized understanding. Pt left with husband at end of tx in w/c brushing teeth at sink. Pt instructed on adaptive tech throughout session to decrease dizziness with verbalized helpfulness.   2nd Session 1:1 tx 46 minutes Pt was found in recliner with sister in law upon skilled OT arrival. Tx focused on improving balance for increasing level of independence for ADLs. Pt participated in a static standing laundry folding task with bilateral UE demands and steadying assistance; after folding several items, pt ambulated with HHA and Min A to dresser to put clothing items away x5 occasions. Pt required  one seated rest break due to fatigue. Was able to independently use strategies to decrease dizziness. Pt reported that swift movements, especially to left side increase dizziness. As do tasks that require pt to "look down." Pt left in recliner at end of tx with all needs within reach and bilateral LEs elevated.No c/o pain.  .   Therapy Documentation Precautions:  Precautions Precautions: Fall Restrictions Weight Bearing Restrictions: No General:   Vital Signs: Therapy Vitals Temp: 98.3 F (36.8 C) Temp Source: Oral Pulse Rate: 62 Resp: 16 BP: (!) 141/81 Patient Position (if appropriate): Sitting Oxygen Therapy SpO2: 96 % O2 Device: Not Delivered Pain: No c/o pain      :    See Function Navigator for Current Functional Status.   Therapy/Group: Individual Therapy  [name] 10/15/2015, 4:52 PM

## 2015-10-16 ENCOUNTER — Inpatient Hospital Stay (HOSPITAL_COMMUNITY): Payer: Self-pay

## 2015-10-16 ENCOUNTER — Inpatient Hospital Stay (HOSPITAL_COMMUNITY): Payer: Self-pay | Admitting: Occupational Therapy

## 2015-10-16 ENCOUNTER — Inpatient Hospital Stay (HOSPITAL_COMMUNITY): Payer: Medicaid Other | Admitting: Physical Therapy

## 2015-10-16 DIAGNOSIS — I10 Essential (primary) hypertension: Secondary | ICD-10-CM

## 2015-10-16 LAB — BASIC METABOLIC PANEL
ANION GAP: 9 (ref 5–15)
BUN: 17 mg/dL (ref 6–20)
CO2: 33 mmol/L — ABNORMAL HIGH (ref 22–32)
Calcium: 9.6 mg/dL (ref 8.9–10.3)
Chloride: 96 mmol/L — ABNORMAL LOW (ref 101–111)
Creatinine, Ser: 1.12 mg/dL — ABNORMAL HIGH (ref 0.44–1.00)
GFR calc Af Amer: >60 mL/min (ref >60)
GFR, EST NON AFRICAN AMERICAN: 55 mL/min — AB (ref >60)
Glucose, Bld: 104 mg/dL — ABNORMAL HIGH (ref 65–99)
POTASSIUM: 3.4 mmol/L — AB (ref 3.5–5.1)
SODIUM: 138 mmol/L (ref 135–145)

## 2015-10-16 LAB — CBC WITH DIFFERENTIAL/PLATELET
BASOS ABS: 0.1 10*3/uL (ref 0.0–0.1)
Basophils Relative: 1 %
Eosinophils Absolute: 0.1 10*3/uL (ref 0.0–0.7)
Eosinophils Relative: 2 %
HEMATOCRIT: 38.4 % (ref 36.0–46.0)
HEMOGLOBIN: 11.7 g/dL — AB (ref 12.0–15.0)
LYMPHS ABS: 2.4 10*3/uL (ref 0.7–4.0)
LYMPHS PCT: 38 %
MCH: 21.7 pg — ABNORMAL LOW (ref 26.0–34.0)
MCHC: 30.5 g/dL (ref 30.0–36.0)
MCV: 71.2 fL — ABNORMAL LOW (ref 78.0–100.0)
MONOS PCT: 10 %
Monocytes Absolute: 0.6 10*3/uL (ref 0.1–1.0)
Neutro Abs: 3.2 10*3/uL (ref 1.7–7.7)
Neutrophils Relative %: 49 %
Platelets: 363 10*3/uL (ref 150–400)
RBC: 5.39 MIL/uL — AB (ref 3.87–5.11)
RDW: 17.9 % — AB (ref 11.5–15.5)
WBC: 6.4 10*3/uL (ref 4.0–10.5)

## 2015-10-16 LAB — GLUCOSE, CAPILLARY
GLUCOSE-CAPILLARY: 106 mg/dL — AB (ref 65–99)
GLUCOSE-CAPILLARY: 94 mg/dL (ref 65–99)
Glucose-Capillary: 102 mg/dL — ABNORMAL HIGH (ref 65–99)
Glucose-Capillary: 135 mg/dL — ABNORMAL HIGH (ref 65–99)

## 2015-10-16 LAB — OCCULT BLOOD X 1 CARD TO LAB, STOOL: Fecal Occult Bld: NEGATIVE

## 2015-10-16 MED ORDER — CLONIDINE HCL 0.1 MG PO TABS
0.1000 mg | ORAL_TABLET | Freq: Two times a day (BID) | ORAL | Status: AC
  Administered 2015-10-16 – 2015-10-18 (×4): 0.1 mg via ORAL
  Filled 2015-10-16 (×4): qty 1

## 2015-10-16 NOTE — Progress Notes (Signed)
Occupational Therapy Note  Patient Details  Name: [name] MRN: [id number] Date of Birth: 04-Apr-1960  Today's Date: 10/16/2015 OT Individual Time: 1100-1127 OT Individual Time Calculation (min): 27 min    Pt denied pain Individual therapy  Pt alseep in recliner upon arrival.  Pt c/o "everything is bright yellow" when she opened her eyes.  Pt stated this resolved after a couple of minutes.  Attempted to assess pt's vision but was unsuccessful secondary to pt exhibiting difficulty keeping eyes open.  Educated pt on use of AE for bathing/dressing tasks.  Educated pt on safety awareness.    [name] [facility] 10/16/2015, 11:28 AM

## 2015-10-16 NOTE — Progress Notes (Signed)
[place] PHYSICAL MEDICINE & REHABILITATION     PROGRESS NOTE  Subjective/Complaints:  Pt seen ambulating with RW from restroom. Pt states she had a great weekend, except for yesterday when she became depressed.   Spoke to pt about depression, however, she does not want any medication at present.    ROS: Negative CP, SOB, N/V/D.  Objective: Vital Signs: Blood pressure (!) 151/63, pulse 73, temperature 98.1 F (36.7 C), temperature source Oral, resp. rate 18, weight 120.9 kg (266 lb 8 oz), last menstrual period 02/17/2011, SpO2 98 %. No results found.  Recent Labs  10/14/15 0435  WBC 6.1  HGB 10.6*  HCT 36.2  PLT 349   No results for input(s): NA, K, CL, GLUCOSE, BUN, CREATININE, CALCIUM in the last 72 hours.  Invalid input(s): CO CBG (last 3)   Recent Labs  10/15/15 1627 10/15/15 2108 10/16/15 0630  GLUCAP 121* 158* 102*    Wt Readings from Last 3 Encounters:  10/16/15 120.9 kg (266 lb 8 oz)  10/06/15 120.8 kg (266 lb 5.1 oz)    Physical Exam:  BP (!) 151/63 (BP Location: Left Arm)   Pulse 73   Temp 98.1 F (36.7 C) (Oral)   Resp 18   Wt 120.9 kg (266 lb 8 oz)   LMP 02/17/2011   SpO2 98%   BMI 53.83 kg/m  Constitutional: She appears well-developed and well-nourished. Nasal cannula in place. Morbidly obese.  HENT: Normocephalic and atraumatic.  Mouth/Throat: Oropharynx is clear and moist.  Eyes: Conjunctivae and EOM are normal. Cardiovascular: Normal rate and regular rhythm.Murmur heard. Respiratory: Effort normal and breath sounds normal. No stridor. No respiratory distress.  GI: Soft. Bowel sounds are normal. She exhibits no distension. There is no tenderness.  Musculoskeletal: She exhibits no edema or tenderness.  Neurological: She is alert and oriented.  Motor strength is 4+/5, bilateral deltoid, biceps, triceps, grip, hip flexor, knee extensors, ankle dorsi/plantar flexors. Skin: Skin is warm and dry.  Psychiatric: She has a normal mood and  affect. Her behavior is normal. Thought content normal.    Assessment/Plan: 1. 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 Physiatrist is providing close team supervision and 24 hour management of active medical problems listed below. Physiatrist and rehab team continue to assess barriers to discharge/monitor patient progress toward functional and medical goals.  Function:  Bathing Bathing position Bathing activity did not occur: N/A (unable due to lethargy) Position: Shower  Bathing parts Body parts bathed by patient: Right arm, Left arm, Chest, Abdomen, Front perineal area, Buttocks, Right upper leg, Left upper leg, Right lower leg, Left lower leg, Back    Bathing assist Assist Level: Touching or steadying assistance(Pt > 75%)      Upper Body Dressing/Undressing Upper body dressing Upper body dressing/undressing activity did not occur: N/A (unable due to lethargy) What is the patient wearing?: Pull over shirt/dress     Pull over shirt/dress - Perfomed by patient: Thread/unthread right sleeve, Thread/unthread left sleeve, Put head through opening, Pull shirt over trunk          Upper body assist Assist Level: Supervision or verbal cues      Lower Body Dressing/Undressing Lower body dressing Lower body dressing/undressing activity did not occur: N/A (unable due to lethargy) What is the patient wearing?: Underwear, Pants, Non-skid slipper socks Underwear - Performed by patient: Thread/unthread right underwear leg, Thread/unthread left underwear  leg, Pull underwear up/down   Pants- Performed by patient: Thread/unthread right pants leg, Thread/unthread left pants leg, Pull pants up/down   Non-skid slipper socks- Performed by patient: Don/doff right sock, Don/doff left sock                    Lower body assist  Assist for lower body dressing: Touching or steadying assistance (Pt > 75%)      Toileting Toileting   Toileting steps completed by patient: Performs perineal hygiene, Adjust clothing prior to toileting Toileting steps completed by helper: Adjust clothing after toileting Toileting Assistive Devices: Grab bar or rail  Toileting assist Assist level: Touching or steadying assistance (Pt.75%)   Transfers Chair/bed transfer   Chair/bed transfer method: Ambulatory Chair/bed transfer assist level: Touching or steadying assistance (Pt > 75%) Chair/bed transfer assistive device: Environmental consultant, Other (HHA)     Locomotion Ambulation     Max distance: 120ft Assist level: Touching or steadying assistance (Pt > 75%)   Wheelchair   Type: Manual Max wheelchair distance: 176ft Assist Level: Supervision or verbal cues  Cognition Comprehension Comprehension assist level: Follows complex conversation/direction with extra time/assistive device (Simultaneous filing. User may not have seen previous data.)  Expression Expression assist level: Expresses complex ideas: With no assist  Social Interaction Social Interaction assist level: Interacts appropriately with others - No medications needed.  Problem Solving Problem solving assist level: Solves complex problems: Recognizes & self-corrects  Memory Memory assist level: Complete Independence: No helper    Medical Problem List and Plan: 1. 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 DVT Prophylaxis/Anticoagulation: Pharmaceutical: Lovenox 3. Pain Management: tylenol prn for knee pain.  4. Mood: LCSW to follow for evaluation and support.  5. Neuropsych: This patient is capable of making decisions on her own behalf.  She notes depression, but refuses medication at present 6. Skin/Wound Care: Routine pressure relief measures 7. Fluids/Electrolytes/Nutrition: Monitor I/O.   Mild  hypokalemia: 3.4 on 7/21  Labs pending for this AM. 8. HTN: Monitor bid. Continue catapres, hygroton, and metoprolol  Labile at present, will consider adjustment of meds if persistently elevated 9. Iron deficiency anemia: Added iron supplement.   Hb 10.6 on 7/22. 10. New onset seizures: Cont Keppra bid.  11. Diarrhea: discontinued laxatives.  12. Prediabetes/Morbid obesity: Encourage weight loss. Will consult dietician to help educate on diet.   CBGs fairly well controlled 13. Dizziness:Check orthostatic vitals.  14. Supplemental Oxygen dependent: Will wean as tolerated 15.Marland Kitchen Urinary incont- check UA 16.Hematochezia- likely hemmorhoid  Hb stable  17. Super Obese  Body mass index is 53.83 kg/m.  Diet and exercise education  Will cont to encourage weight loss to increase endurance and promote overall health  LOS (Days) 4 A FACE TO FACE EVALUATION WAS PERFORMED  Ankit Lorie Phenix 10/16/2015 9:02 AM

## 2015-10-16 NOTE — Progress Notes (Signed)
Social Work Social Work Assessment and Plan  Patient Details  Name: [name] MRN: [id number] Date of Birth: 1960/07/28  Today's Date: 10/13/2015  Problem List:  Patient Active Problem List   Diagnosis Date Noted  . Hypokalemia   . Anemia, iron deficiency   . Super obese ([place])   . Prediabetes   . Orthostatic hypotension   . Supplemental oxygen dependent   . Diastolic dysfunction   . Tachypnea   . AKI (acute kidney injury) ([place])   . Acute blood loss anemia   . Lethargy   . Wheezing   . Benign essential HTN   . ICH (intracerebral hemorrhage) ([place]) 10/06/2015   Past Medical History:  Past Medical History:  Diagnosis Date  . Hypertension   . Iron deficiency anemia    Past Surgical History:  Past Surgical History:  Procedure Laterality Date  . CARDIAC CATHETERIZATION  04/2010   Dr. Terrence Dupont   Social History:  reports that she has never smoked. She does not have any smokeless tobacco history on file. She reports that she does not drink alcohol or use drugs.  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 Caregiver Availability: 24/7 Family Dynamics: Pt describes her husband as "extremely supportive" to both her and the other family members.  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 Length of Employment:  ("all my working life") Return to Work Plans: TBD;  pt concerned about stress of job and is considering if need to do a SSD application while here. Legal Hisotry/Current Legal Issues: None Guardian/Conservator: None - per MD, pt capable of making decisions on her own behalf   Abuse/Neglect Physical Abuse: Denies Verbal Abuse: Denies Sexual Abuse: Denies Exploitation of patient/patient's resources: Denies Self-Neglect: Denies  Emotional Status Pt's affect, behavior adn adjustment status: Pt very pleasant and humorous at times.  She does become tearful briefly as she asks, "Am I going to get worse?  Am I going to be a burden on my family?"  Provided support and we talked about her life of being a caregiver to others and now having to accept assist from others.  She denies any s/s of depression and declines idea of any medication.  Will refer for neuropsychology eval if LOS allows.   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  Good understanding of her deficits/ need for CIR. 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  Primary change is that pt is only employed family member in the household. Pt/family expectations/goals: "I just don't want to be a burden."  [employer]: None Premorbid Home Care/DME  Agencies: None Transportation available at discharge: yes Resource referrals recommended: Neuropsychology, Support group (specify)  Discharge Planning Living Arrangements: Spouse/significant other, Other relatives Support Systems: Spouse/significant other, Other relatives, Children, Friends/neighbors Type of Residence: Private residence Insurance Resources: Teacher, adult education Resources: Employment Financial Screen Referred: Previously completed Living Expenses: Education officer, community Management: Patient Does the patient have any problems obtaining your medications?: Yes (Describe) (no insurance) Home Management: Pt and spouse Patient/Family Preliminary Plans: Pt to return home with her husband and family.  Husband able to provide light assistance. Social Work Anticipated Follow Up Needs: HH/OP, Support Group Expected length of stay: 7-10 days  Clinical Impression Very pleasant woman here following an ICH.  Motivated for therapies but concerned about being a burden on her family after dc.  She is herself a CNA and admits it is hard to have the roles "reversed" on her.  Excellent family support and anticipating a short LOS.  Parry Po 10/13/2015, 4:22 PM

## 2015-10-16 NOTE — Progress Notes (Signed)
Physical Therapy Session Note  Patient Details  Name: [name] MRN: [id number] Date of Birth: 05-14-1960  Today's Date: 10/16/2015 PT Individual Time: [id number] and 1345-1435 PT Individual Time Calculation (min): 60 min and 50 min   Short Term Goals: Week 1:  PT Short Term Goal 1 (Week 1): STG=LTG due to ELOS  Skilled Therapeutic Interventions/Progress Updates:    Treatment 1: Pt received in handoff from OT & agreeable to session, reporting 4/10 pressure in headache that decreases when she blows her nose. Pt reports dizziness with movement; educated pt on potential vestibular cause of dizziness & scheduled vestibular eval this week. Pt's BP at rest (sitting) = 112/55 mmHg. Session focused on gait & stair training. Throughout session pt completed all sit<>stand transfers with supervision & cuing to reach back for seat before transferring stand<>sit. Gait training x 100 ft + 60 ft + 150 ft with RW & supervision; pt demonstrates decreased step length BLE, toe out RLE, & decreased gait speed. Pt focuses on specific spot during ambulation to help reduce dizziness but then begins to veer R & has to correct. Stair training completed on 6" steps, 4 steps x 2 trials, with L ascending rail. Pt reports she negotiates stairs at home with single rail therefore activity completed to simulate home environment. Educated pt to step out farther with L foot when descending stairs to allow sufficient room for RLE without crossing BLE. Pt able to return demonstrate but has to look at feet during task in order not to step too wide towards the edge of the step. Utilized cybex kinetron in sitting for BLE strengthening. Educated pt on pursed lip breathing to help reduce respirations. At end of session pt left sitting in recliner with all needs within reach & husband present. Throughout session pt required multiple rest breaks 2/2 dizziness.   Treatment 2: Pt received in recliner & agreeable to PT, denying c/o pain. Pt  reports she does not remember some therapy sessions prior to lunch & does not remember lunch tray arriving; pt does note she received pain medicine during mid morning & this may have made her drowsy. Gait training x 50 ft room>ortho gym with RW & supervision. Pt able to perform car transfer from sedan simulated height with supervision & simulated grab bar. Pt reports it is easier for her to get into sedan versus SUV prior to admission & this therapist recommended that she travel home in [place] at d/c and pt voiced understanding. Completed gait training with SPC & LBQC & pt holding AD in LUE as she reports RUE feels weaker. Gait training x 40 ft with each device with min A & maximum cuing for sequencing. Pt with lateral sway & losses of balance requiring Mod A x 1 occasion to correct. Pt reports she feels more comfortable ambulating with RW at this time. Gait training x 150 ft back to room with RW & supervision A; therapist educated pt to scan environment instead of focusing on single object & pt with less veering to R. At end of session pt left sitting in recliner with all needs within reach.   Therapy Documentation Precautions:  Precautions Precautions: Fall Restrictions Weight Bearing Restrictions: No  Pain: Pain Assessment Pain Assessment: 0-10 Pain Score: 4  Pain Location: Head Pain Descriptors / Indicators: Pressure Pain Intervention(s): Ambulation/increased activity;Distraction   See Function Navigator for Current Functional Status.   Therapy/Group: Individual Therapy  Waunita Schooner 10/16/2015, 8:00 AM

## 2015-10-16 NOTE — Care Management Note (Signed)
Inpatient Rehabilitation Center Individual Statement of Services  Patient Name:  [name]  Date:  10/16/2015  Welcome to the [facility].  Our goal is to provide you with an individualized program based on your diagnosis and situation, designed to meet your specific needs.  With this comprehensive rehabilitation program, you will be expected to participate in at least 3 hours of rehabilitation therapies Monday-Friday, with modified therapy programming on the weekends.  Your rehabilitation program will include the following services:  Physical Therapy (PT), Occupational Therapy (OT), Speech Therapy (ST), 24 hour per day rehabilitation nursing, Therapeutic Recreaction (TR), Neuropsychology, Case Management (Social Worker), Rehabilitation Medicine, Nutrition Services and Pharmacy Services  Weekly team conferences will be held on Wednesdays to discuss your progress.  Your Social Worker will talk with you frequently to get your input and to update you on team discussions.  Team conferences with you and your family in attendance may also be held.  Expected length of stay: 7-10 days  Overall anticipated outcome: mod independent  Depending on your progress and recovery, your program may change. Your Social Worker will coordinate services and will keep you informed of any changes. Your Social Worker's name and contact numbers are listed  below.  The following services may also be recommended but are not provided by the [facility] will be made to provide these services after discharge if needed.  Arrangements include referral to agencies that provide these services.  Your insurance has been verified to be:  None Your primary doctor is:  None  Pertinent information will be shared with your doctor and your insurance  company.  Social Worker:  Luke, [place] or ([phone]   Information discussed with and copy given to patient by: Lennart Pall, 10/16/2015, 4:24 PM

## 2015-10-16 NOTE — Progress Notes (Signed)
Occupational Therapy Session Note  Patient Details  Name: [name] MRN: [id number] Date of Birth: 1960/03/26  Today's Date: 10/16/2015 OT Individual Time: 0810-0905 OT Individual Time Calculation (min): 55 min    Short Term Goals: Week 1:  OT Short Term Goal 1 (Week 1): STGs=LTGs due to ELOS   Skilled Therapeutic Interventions/Progress Updates:  Patient found seated on elevated toilet seat in BR with RN present. Pt with no direct complaints of pain, but complaints of dizziness and some pressure in head after RN took blood pressures; notified RN of this. Pt ambulated out of BR and to the sink. Pt stood at sink for grooming tasks of washing face and brushing teeth. Pt performed UB/LB bathing and dressing in sit to/from stand position. Pt required assistance for LB ADLs, encouraged pt to try AE to increase independence. As the session progressed, pt commented that she's starting to feel "a little better than earlier". Pt with decreased memory and unable to remember this therapist from iniital evaluation, "I remember your face, but don't know where I know you from". Pt also unable to remember Dr when he came in to speak with her. After B&D, pt ambulated back into BR with supervision and increased dizziness ("little"). Focused skilled intervention on ADL retraining, sit to/from stands, dynamic standing balance/tolerance/endurance, breathing techniques when pt seemed out of breath, and overall activity tolerance/endurance. During ADL, pt took increased time, but able to complete ADL safely and effectively. At end of session, left pt seated in recliner with PT present for next therapy session.   Continue to recommend a vestibular evaluation to help figure out cause of dizziness during transitional movements.  Therapy Documentation Precautions:  Precautions Precautions: Fall Restrictions Weight Bearing Restrictions: No  Vital Signs: Therapy Vitals Temp: 98.1 F (36.7 C) Temp Source:  Oral Pulse Rate: 73 Resp: 18 BP: (!) 151/63 Patient Position (if appropriate): Sitting Oxygen Therapy SpO2: 98 %  See Function Navigator for Current Functional Status.   Therapy/Group: Individual Therapy  Chrys Racer , MS, OTR/L, CLT  10/16/2015, 9:12 AM

## 2015-10-17 ENCOUNTER — Inpatient Hospital Stay (HOSPITAL_COMMUNITY): Payer: Self-pay | Admitting: Physical Therapy

## 2015-10-17 ENCOUNTER — Inpatient Hospital Stay (HOSPITAL_COMMUNITY): Payer: Medicaid Other | Admitting: Physical Therapy

## 2015-10-17 ENCOUNTER — Inpatient Hospital Stay (HOSPITAL_COMMUNITY): Payer: Self-pay

## 2015-10-17 DIAGNOSIS — N3944 Nocturnal enuresis: Secondary | ICD-10-CM

## 2015-10-17 LAB — GLUCOSE, CAPILLARY: Glucose-Capillary: 103 mg/dL — ABNORMAL HIGH (ref 65–99)

## 2015-10-17 MED ORDER — POTASSIUM CHLORIDE CRYS ER 20 MEQ PO TBCR
20.0000 meq | EXTENDED_RELEASE_TABLET | Freq: Every day | ORAL | Status: DC
  Administered 2015-10-17 – 2015-10-21 (×5): 20 meq via ORAL
  Filled 2015-10-17 (×5): qty 1

## 2015-10-17 NOTE — Progress Notes (Signed)
Occupational Therapy Note  Patient Details  Name: [name] MRN: [id number] Date of Birth: Aug 07, 1960  Today's Date: 10/17/2015 OT Individual Time: 1100-1200 OT Individual Time Calculation (min): 60 min    Pt denied pain Individual Therapy  Pt resting in recliner upon arrival.  Pt stated she "cleaned up" and dressed earlier in the morning.  Pt amb with RW to ADL apartment and engaged in simple home mgmt tasks to challenge standing balance and safety awareness.  Pt issued a walker bag to use at discharge.  Pt attempted to make up bed by placing comforter from floor onto bed.  Pt had to stop task secondary to increased dizziness. Pt said her dizziness is worse when looking down. Pt is scheduled for vestibular eval later in the day.  Pt sat EOB for approx 4 mins before returning to room and recliner.  Pt remained in recliner with all needs within reach.  Focus on activity tolerance, functional amb with RW, RW safety awareness, and standing balance.   Leotis Shames [facility] 10/17/2015, 12:18 PM

## 2015-10-17 NOTE — Progress Notes (Signed)
[place] PHYSICAL MEDICINE & REHABILITATION     PROGRESS NOTE  Subjective/Complaints:  Pt sitting up at the edge of her bed eaiting breakfast.  She is upset because she had 3 incontinent urine episodes overnight.  She also complains of dizziness on standing.   ROS: +Dizziness with standing. Negative CP, SOB, N/V/D.  Objective: Vital Signs: Blood pressure (!) 131/52, pulse 97, temperature 98 F (36.7 C), temperature source Oral, resp. rate 20, weight 119.8 kg (264 lb 1.8 oz), last menstrual period 02/17/2011, SpO2 98 %. No results found.  Recent Labs  10/16/15 1005  WBC 6.4  HGB 11.7*  HCT 38.4  PLT 363    Recent Labs  10/16/15 1005  NA 138  K 3.4*  CL 96*  GLUCOSE 104*  BUN 17  CREATININE 1.12*  CALCIUM 9.6   CBG (last 3)   Recent Labs  10/16/15 1612 10/16/15 2039 10/17/15 0644  GLUCAP 106* 94 103*    Wt Readings from Last 3 Encounters:  10/17/15 119.8 kg (264 lb 1.8 oz)  10/06/15 120.8 kg (266 lb 5.1 oz)    Physical Exam:  BP (!) 131/52 (BP Location: Right Arm)   Pulse 97   Temp 98 F (36.7 C) (Oral)   Resp 20   Wt 119.8 kg (264 lb 1.8 oz)   LMP 02/17/2011   SpO2 98%   BMI 53.34 kg/m  Constitutional: She appears well-developed and well-nourished. Nasal cannula in place. Morbidly obese.  HENT: Normocephalic and atraumatic.  Mouth/Throat: Oropharynx is clear and moist.  Eyes: Conjunctivae and EOM are normal. Cardiovascular: Normal rate and regular rhythm.Murmur heard. Respiratory: Effort normal and breath sounds normal. No stridor. No respiratory distress.  GI: Soft. Bowel sounds are normal. She exhibits no distension. There is no tenderness.  Musculoskeletal: She exhibits no edema or tenderness.  Neurological: She is alert and oriented.  Motor strength is 4+/5, bilateral deltoid, biceps, triceps, grip, hip flexor, knee extensors, ankle dorsi/plantar flexors. Skin: Skin is warm and dry.  Psychiatric: She has a normal mood and affect. Her  behavior is normal. Thought content normal.    Assessment/Plan: 1. 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 Physiatrist is providing close team supervision and 24 hour management of active medical problems listed below. Physiatrist and rehab team continue to assess barriers to discharge/monitor patient progress toward functional and medical goals.  Function:  Bathing Bathing position Bathing activity did not occur: N/A (unable due to lethargy) Position: Shower  Bathing parts Body parts bathed by patient: Right arm, Left arm, Chest, Abdomen, Front perineal area, Buttocks, Right upper leg, Left upper leg, Right lower leg, Left lower leg, Back    Bathing assist Assist Level: Touching or steadying assistance(Pt > 75%)      Upper Body Dressing/Undressing Upper body dressing Upper body dressing/undressing activity did not occur: N/A (unable due to lethargy) What is the patient wearing?: Pull over shirt/dress     Pull over shirt/dress - Perfomed by patient: Thread/unthread right sleeve, Thread/unthread left sleeve, Put head through opening, Pull shirt over trunk          Upper body assist Assist Level: Supervision or verbal cues      Lower Body Dressing/Undressing Lower body dressing Lower body dressing/undressing activity did not occur: N/A (unable due to lethargy) What is the patient wearing?: Underwear, Pants, Non-skid slipper socks Underwear - Performed by patient: Thread/unthread right  underwear leg, Thread/unthread left underwear leg, Pull underwear up/down   Pants- Performed by patient: Thread/unthread right pants leg, Thread/unthread left pants leg, Pull pants up/down   Non-skid slipper socks- Performed by patient: Don/doff right sock, Don/doff left sock                    Lower body assist Assist for  lower body dressing: Touching or steadying assistance (Pt > 75%)      Toileting Toileting   Toileting steps completed by patient: Adjust clothing prior to toileting, Performs perineal hygiene Toileting steps completed by helper: Adjust clothing after toileting Toileting Assistive Devices: Grab bar or rail  Toileting assist Assist level: Touching or steadying assistance (Pt.75%)   Transfers Chair/bed transfer   Chair/bed transfer method: Ambulatory Chair/bed transfer assist level: Touching or steadying assistance (Pt > 75%) Chair/bed transfer assistive device: Environmental consultant, Other (HHA)     Locomotion Ambulation     Max distance: 150 ft Assist level: Supervision or verbal cues   Wheelchair   Type: Manual Max wheelchair distance: 129ft Assist Level: Supervision or verbal cues  Cognition Comprehension Comprehension assist level: Follows basic conversation/direction with no assist  Expression Expression assist level: Expresses basic needs/ideas: With no assist  Social Interaction Social Interaction assist level: Interacts appropriately 75 - 89% of the time - Needs redirection for appropriate language or to initiate interaction.  Problem Solving Problem solving assist level: Solves basic problems with no assist  Memory Memory assist level: Recognizes or recalls 75 - 89% of the time/requires cueing 10 - 24% of the time    Medical Problem List and Plan: 1. 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 DVT Prophylaxis/Anticoagulation: Pharmaceutical: Lovenox 3. Pain Management: tylenol prn for knee pain.  4. Mood: LCSW to follow for evaluation and support.  5. Neuropsych: This patient is capable of making decisions on her own behalf.  She notes depression, but refuses medication at present 6. Skin/Wound Care: Routine pressure relief measures 7. Fluids/Electrolytes/Nutrition: Monitor I/O.   Mild hypokalemia: 3.4 on  7/24, supplement increased 8. HTN: Monitor bid. Continue catapres, hygroton, and metoprolol 9. Iron deficiency anemia: Added iron supplement.   Hb 11.7 on 7/24. 10. New onset seizures: Cont Keppra bid.  11. Diarrhea: discontinued laxatives.  12. Prediabetes/Morbid obesity: Encourage weight loss. Will consult dietician to help educate on diet.   CBGs fairly well controlled, changed to daily 13. Dizziness:Check orthostatic vitals.  14. Supplemental Oxygen dependent: Will wean as tolerated 15.Marland Kitchen Urinary incont- check UA 16.Hematochezia- Resolved  Likely hemmorhoid 17. Super Obese  Body mass index is 53.34 kg/m.  Diet and exercise education  Will cont to encourage weight loss to increase endurance and promote overall health 18. Urinary incontinence   Pt refusing medications at present  Timed voiding  PVRs pending  LOS (Days) 5 A FACE TO FACE EVALUATION WAS PERFORMED  Ankit Lorie Phenix 10/17/2015 8:13 AM

## 2015-10-17 NOTE — Progress Notes (Signed)
Physical Therapy Session Note  Patient Details  Name: [name] MRN: [id number] Date of Birth: December 07, 1960  Today's Date: 10/17/2015 PT Individual Time: 1420-1530 PT Individual Time Calculation (min): 70 min    Short Term Goals: Week 1:  PT Short Term Goal 1 (Week 1): STG=LTG due to ELOS   Skilled Therapeutic Interventions/Progress Updates:  Pt received asleep in recliner; pt continues to report dizziness.  Educated pt on purpose of vestibular evaluation.  Patient agreeable.  Pt denied pain.  Pt performed sit > stand and ambulated to gym with RW and min A due to continued presence of R lateral drift during gait and running into objects on R with RW.  Pt participated in vestibular evaluation as documented below.  Pt returned to room ambulating with RW and min A and returned to recliner with husband present.  Provided pt with target letter cards in room to begin HEP; primary PT educated on recommendations.  1. History and Physical Examination    Pt reports acute episode of room and head spinning vertigo that first occurred on the day she "woke up" in the hospital after her hemorrhage.  She does not recall the 4 days prior when she was admitted to the hospital.  Pt reports initially she had constant vertigo and diplopia; now she reports intermittent episodes of spinning with sudden head movements in bed, in sitting and standing.  She reports that her diplopia has improved but continues to c/o blurred vision especially near vision.  She denies nausea, vomiting, or changes in hearing.  She also reports a fall in the bathroom at home 3-4 months prior due to falling asleep in the bathroom but no symptoms of vertigo s/p fall, only neck pain.    Glasses: Y for reading    2. Vestibular Assessment                           Gross neck ROM WFL but guarded  Eye Alignment WFL  Oculomotor ROM WFL  Spontaneous  Nystagmus (room light and vision occluded) None seen in room light  Gaze holding  nystagmus(room light and vision occluded) None seen in room light  Smooth pursuit WFL  Saccades WFL but reports feeling dizzy with vertical saccades  Vergence Impaired   VOR Cancellation WFL  Pressure Tests (vision occluded) N/T  VOR slow WFL  Head Thrust Test Negative  Head Shaking Test (vision occluded) N/T  Dynamic Visual Acuity         N/T  Rt. Hallpike Dix Negative  Lt. Hallpike Dix Negative  Rt. Roll Test Negative  Lt. Roll Test  Negative  MSQ Most provocative movements include: head rotation and pitches, nose to R and L knee, supine > sit, pivoting in standing  Cover-Cross Cover (if indicated) R eye rests in outward position when covered  Head-Neck Differentiation Test (if indicated) N/T    3. Findings:  Patient signs and symptoms consistent with motion sensitivity, central vertigo, impaired visual-vestibular interactions and impaired subjective postural vertical.  4. Recommendations for Treatment: Habituation for motion sensitivity with pt performing 5 >> 10 reps seated <> down to R/L elbow on edge of bed with gaze stabilized on visual letter target 5-6 feet away; Gaze Stability training with pt performing 30 seconds each horizontal and vertical head turns beginning in fully supported sitting >> unsupported sitting but feet supported >> unsupported feet and back in sitting; Balance: reorientation to midline and L lateral weight shift training.  Therapy Documentation Precautions:  Precautions Precautions: Fall Restrictions Weight Bearing Restrictions: No   See Function Navigator for Current Functional Status.   Therapy/Group: Individual Therapy  Raylene [facility] 10/17/2015, 6:11 PM

## 2015-10-17 NOTE — Progress Notes (Signed)
Physical Therapy Session Note  Patient Details  Name: [name] MRN: [id number] Date of Birth: 1960-07-12  Today's Date: 10/17/2015 PT Individual Time: 0904-1002 PT Individual Time Calculation (min): 58 min    Short Term Goals: Week 1:  PT Short Term Goal 1 (Week 1): STG=LTG due to ELOS  Skilled Therapeutic Interventions/Progress Updates:    Pt received bathing at sink in chair & agreeable to PT. Pt reported 5/10 pain in head but reports being premedicated. Pt completed bathing at sink while standing and sitting with supervision A. Pt able to complete other grooming tasks (brushing teeth, hair) in same manner. Discussed vestibular therapy treatment scheduled later today with pt & husband. Gait training x 50 ft room>rehab apartment with RW & supervision with cuing for appropriate hand placement on RW. In apartment pt completed transfer from low, soft couch with supervision & armrests, and bed mobility (rolling L<>R, supine<>sit) with supervision A. Educated pt not to sit in center of couch, instead sit on ends so she can utilize armrests to push up on. Educated pt on bridging & rolling in bed, as well as leg positioning to assist with technique & ease of task. Pt reports & demonstrated her method of getting in/out of bed, as she lays down on her stomach by climbing into bed. Pt also reports she kneels on knees at home, educated pt not to do this and instead to sit in chairs & pt voiced understanding.  Educated pt & husband on need to ensure clear pathways & to remove all throw rugs in their home to limit tripping hazards & they voiced understanding. Gait training throughout obstacle course with pt weaving between cones, walking laterally, ambulating over uneven surfaces & stepping over obstacles. Pt required cuing for approximation of RW & feet to obstacles. Gait training x 100 ft back to room with RW, supervision & significantly decreased gait speed. At end of session pt left sitting in recliner  with all needs within reach & husband present to supervise.   Therapy Documentation Precautions:  Precautions Precautions: Fall Restrictions Weight Bearing Restrictions: No  Pain: Pain Assessment Pain Assessment: 0-10 Pain Score: 5  Pain Location: Head Pain Intervention(s): Ambulation/increased activity (premedicated per pt)   See Function Navigator for Current Functional Status.   Therapy/Group: Individual Therapy  Waunita Schooner 10/17/2015, 7:51 AM

## 2015-10-18 ENCOUNTER — Inpatient Hospital Stay (HOSPITAL_COMMUNITY): Payer: Medicaid Other | Admitting: Physical Therapy

## 2015-10-18 ENCOUNTER — Inpatient Hospital Stay (HOSPITAL_COMMUNITY): Payer: Medicaid Other | Admitting: Occupational Therapy

## 2015-10-18 ENCOUNTER — Inpatient Hospital Stay (HOSPITAL_COMMUNITY): Payer: Medicaid Other | Admitting: Speech Pathology

## 2015-10-18 ENCOUNTER — Inpatient Hospital Stay (HOSPITAL_COMMUNITY): Payer: Self-pay | Admitting: Physical Therapy

## 2015-10-18 LAB — GLUCOSE, CAPILLARY: Glucose-Capillary: 112 mg/dL — ABNORMAL HIGH (ref 65–99)

## 2015-10-18 NOTE — Progress Notes (Signed)
[name] PHYSICAL MEDICINE & REHABILITATION     PROGRESS NOTE  Subjective/Complaints:  Pt sitting at the edge of her bed working with SLP.  She states she slept well and feel "much better" today regarding her depression.  She states she spoke to some friend who went through similar experiences.    ROS:  Negative CP, SOB, N/V/D.  Objective: Vital Signs: Blood pressure 133/80, pulse 83, temperature 98.4 F (36.9 C), temperature source Oral, resp. rate 18, weight 120 kg (264 lb 8.8 oz), last menstrual period 02/17/2011, SpO2 96 %. No results found.  Recent Labs  10/16/15 1005  WBC 6.4  HGB 11.7*  HCT 38.4  PLT 363    Recent Labs  10/16/15 1005  NA 138  K 3.4*  CL 96*  GLUCOSE 104*  BUN 17  CREATININE 1.12*  CALCIUM 9.6   CBG (last 3)   Recent Labs  10/16/15 2039 10/17/15 0644 10/18/15 0642  GLUCAP 94 103* 112*    Wt Readings from Last 3 Encounters:  10/18/15 120 kg (264 lb 8.8 oz)  10/06/15 120.8 kg (266 lb 5.1 oz)    Physical Exam:  BP 133/80 (BP Location: Left Wrist)   Pulse 83   Temp 98.4 F (36.9 C) (Oral)   Resp 18   Wt 120 kg (264 lb 8.8 oz)   LMP 02/17/2011   SpO2 96%   BMI 53.43 kg/m  Constitutional: She appears well-developed and well-nourished. Nasal cannula in place. Morbidly obese.  HENT: Normocephalic and atraumatic.  Mouth/Throat: Oropharynx is clear and moist.  Eyes: Conjunctivae and EOM are normal. Cardiovascular: Normal rate and regular rhythm.Murmur heard. Respiratory: Effort normal and breath sounds normal. No stridor. No respiratory distress.  GI: Soft. Bowel sounds are normal. She exhibits no distension. There is no tenderness.  Musculoskeletal: She exhibits no edema or tenderness.  Neurological: She is alert and oriented.  Motor strength is 4+/5, bilateral deltoid, biceps, triceps, grip, hip flexor, knee extensors, ankle dorsi/plantar flexors. Skin: Skin is warm and dry.  Psychiatric: She has a normal mood and affect. Her  behavior is normal. Thought content normal.    Assessment/Plan: 1. 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 Physiatrist is providing close team supervision and 24 hour management of active medical problems listed below. Physiatrist and rehab team continue to assess barriers to discharge/monitor patient progress toward functional and medical goals.  Function:  Bathing Bathing position Bathing activity did not occur: N/A (unable due to lethargy) Position: Shower  Bathing parts Body parts bathed by patient: Right arm, Left arm, Chest, Abdomen, Front perineal area, Buttocks, Right upper leg, Left upper leg, Right lower leg, Left lower leg, Back    Bathing assist Assist Level: Touching or steadying assistance(Pt > 75%)      Upper Body Dressing/Undressing Upper body dressing Upper body dressing/undressing activity did not occur: N/A (unable due to lethargy) What is the patient wearing?: Pull over shirt/dress     Pull over shirt/dress - Perfomed by patient: Thread/unthread right sleeve, Thread/unthread left sleeve, Put head through opening, Pull shirt over trunk          Upper body assist Assist Level: Supervision or verbal cues      Lower Body Dressing/Undressing Lower body dressing Lower body dressing/undressing activity did not occur: N/A (unable due to lethargy) What is the patient wearing?: Underwear, Pants, Non-skid slipper socks Underwear - Performed  by patient: Thread/unthread right underwear leg, Thread/unthread left underwear leg, Pull underwear up/down   Pants- Performed by patient: Thread/unthread right pants leg, Thread/unthread left pants leg, Pull pants up/down   Non-skid slipper socks- Performed by patient: Don/doff right sock, Don/doff left sock                    Lower body assist Assist for  lower body dressing: Touching or steadying assistance (Pt > 75%)      Toileting Toileting   Toileting steps completed by patient: Adjust clothing prior to toileting, Performs perineal hygiene, Adjust clothing after toileting Toileting steps completed by helper: Adjust clothing after toileting Toileting Assistive Devices: Grab bar or rail  Toileting assist Assist level: Touching or steadying assistance (Pt.75%)   Transfers Chair/bed transfer   Chair/bed transfer method: Ambulatory Chair/bed transfer assist level: Touching or steadying assistance (Pt > 75%) Chair/bed transfer assistive device: Medical sales representative     Max distance: 150 ft Assist level: Touching or steadying assistance (Pt > 75%)   Wheelchair   Type: Manual Max wheelchair distance: 122ft Assist Level: Supervision or verbal cues  Cognition Comprehension Comprehension assist level: Follows complex conversation/direction with no assist  Expression Expression assist level: Expresses complex ideas: With no assist  Social Interaction Social Interaction assist level: Interacts appropriately with others - No medications needed.  Problem Solving Problem solving assist level: Solves complex problems: Recognizes & self-corrects  Memory Memory assist level: Complete Independence: No helper    Medical Problem List and Plan: 1. 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 DVT Prophylaxis/Anticoagulation: Pharmaceutical: Lovenox 3. Pain Management: tylenol prn for knee pain.  4. Mood: LCSW to follow for evaluation and support.  5. Neuropsych: This patient is capable of making decisions on her own behalf.  She notes depression, but refuses medication at present.  She appears to be doing better at present.   6. Skin/Wound Care: Routine pressure relief measures 7. Fluids/Electrolytes/Nutrition: Monitor I/O.   Mild hypokalemia: 3.4 on 7/24,  supplement increased 8. HTN: Monitor bid. Continue catapres, hygroton, and metoprolol 9. Iron deficiency anemia: Added iron supplement.   Hb 11.7 on 7/24. 10. New onset seizures: Cont Keppra bid.  11. Diarrhea: discontinued laxatives.  12. Prediabetes/Morbid obesity: Encourage weight loss. Will consult dietician to help educate on diet.   CBGs fairly well controlled, changed to daily checks 13. Dizziness:Check orthostatic vitals.  14. Supplemental Oxygen dependent: Will wean as tolerated 15.Marland Kitchen Urinary incont- check UA 16.Hematochezia- Resolved  Likely hemmorhoid 17. Super Obese  Body mass index is 53.43 kg/m.  Diet and exercise education  Will cont to encourage weight loss to increase endurance and promote overall health 18. Urinary incontinence   Pt refusing medications at present  Timed voiding  PVRs pending  LOS (Days) 6 A FACE TO FACE EVALUATION WAS PERFORMED  Envy Meno Lorie Phenix 10/18/2015 9:01 AM

## 2015-10-18 NOTE — Evaluation (Signed)
Speech Language Pathology Assessment and Plan  Patient Details  Name: [name] MRN: [id number] Date of Birth: 12-17-1960  SLP Diagnosis: Cognitive Impairments  Rehab Potential: Excellent ELOS: 7 days    Today's Date: 10/18/2015 SLP Individual Time:  88- 900 60 minutes      Problem List:  Patient Active Problem List   Diagnosis Date Noted  . Nocturnal enuresis   . Hypokalemia   . Anemia, iron deficiency   . Super obese ([place])   . Prediabetes   . Orthostatic hypotension   . Supplemental oxygen dependent   . Diastolic dysfunction   . Tachypnea   . AKI (acute kidney injury) ([place])   . Acute blood loss anemia   . Lethargy   . Wheezing   . Benign essential HTN   . ICH (intracerebral hemorrhage) ([place]) 10/06/2015   Past Medical History:  Past Medical History:  Diagnosis Date  . Hypertension   . Iron deficiency anemia    Past Surgical History:  Past Surgical History:  Procedure Laterality Date  . CARDIAC CATHETERIZATION  04/2010   Dr. Terrence Dupont    Assessment / Plan / Recommendation Clinical Impression   [name] is a 55 year old RH-female with history of morbid obesity, HTN, iron deficiency anemia who was admitted on 10/06/15 with sudden LOC and witnessed seizure activity.  She was confused on awakening and was started on Keppra and cleviprex for treatment.  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 NIHSS 0.  She did complain of chest pain and CTA chest negative for PE. Cardiology consulted due to elevated troponin and felt that elevation likely due to [place]. She had low risk nuclear stress test in 2012 --does get SOB with mild to moderate activity but no prior hx CAD.  Cardiac echo with EF 21-11%, grade 2 diastolic dysfunction and LVH without wall abnormality.  Medications adjusted for management fo hypertensive hear diseaseBLE dopplers negative for DVT. MRI/MRA brain with stable right temporal IPH with mild edema and evidence of chronic small micro  hemorrhages. Neurology felt that [place] due to uncontrolled HTN and SBP goal  <160. Family concerned about ongoing issues with sedation but Dr. Erlinda Hong recommends keeping higher dose Keppra due to patient seizing on lower dose.   Pt participated in cognitive-linguistic assessment. Speech and language were judged to be WNL with the exception of increased time for written language. Pt scored a 27/30 on the [facility] Cognitive Assessment which was [facility], however pt indicated that mental work seems much more laborious since her stroke and she has noticed decreased speed of processing. Pt manages high level cognitive tasks in her daily life- medications/financial and household, therefore it was felt pt could benefit from SLP services to maximize functional independence with high level tasks. Pt verbalized agreement and appreciation.    Skilled Therapeutic Interventions          Discussed compensatory strategies for memory- pt was able to successfully demonstrate visualization and association as beneficial tools with min A. Min A with functional math word problems.   SLP Assessment  Patient will need skilled [place] [facility] during CIR admission    Recommendations  Patient destination: Home Follow up Recommendations: None Equipment Recommended: None recommended by SLP    SLP Frequency 1 to 3 out of 7 days   SLP Duration  SLP Intensity  SLP Treatment/Interventions 7 days  Minumum of 1-2 x/day, 30 to 90 minutes  Cognitive remediation/compensation;Speech/Language facilitation;Medication managment;Functional tasks;Patient/family education    Pain  Pain Assessment Pain Assessment: No/denies pain  Prior Functioning    Function:  Eating Eating     Eating Assist Level: No help, No cues           Cognition Comprehension Comprehension assist level: Follows complex conversation/direction with no assist  Expression   Expression assist level: Expresses complex 90% of the time/cues <  10% of the time  Social Interaction Social Interaction assist level: Interacts appropriately with others with medication or extra time (anti-anxiety, antidepressant).  Problem Solving Problem solving assist level: Solves complex problems: With extra time  Memory Memory assist level: Assistive device: No helper   Short Term Goals: Week 1: SLP Short Term Goal 1 (Week 1): Pt complete medication management tasks at mod I. SLP Short Term Goal 2 (Week 1): Pt demonstrate money management tasks at mod I. SLP Short Term Goal 3 (Week 1): Pt demonstrate function recall of new information with use of compensatory strategies at mod I.  Refer to Care Plan for Long Term Goals  Recommendations for other services: None  Discharge Criteria: Patient will be discharged from SLP if patient refuses treatment 3 consecutive times without medical reason, if treatment goals not met, if there is a change in medical status, if patient makes no progress towards goals or if patient is discharged from hospital.  The above assessment, treatment plan, treatment alternatives and goals were discussed and mutually agreed upon: by patient  Vinetta Bergamo MA, CCC-SLP 10/18/2015, 5:03 PM

## 2015-10-18 NOTE — Progress Notes (Signed)
Physical Therapy Session Note  Patient Details  Name: [name] MRN: [id number] Date of Birth: 05/14/1960  Today's Date: 10/18/2015 PT Individual Time: 1100-1200 PT Individual Time Calculation (min): 60 min    Short Term Goals: Week 1:  PT Short Term Goal 1 (Week 1): STG=LTG due to ELOS  Skilled Therapeutic Interventions/Progress Updates:     Patient received sitting in recliner and agreeable to PT.   PT instructed patient in vestibular/NMR re-education with sit>sidelying With visual stabilization on target x 6 to R and L. Patient utilized bed rails for sit<>sidelying transfer and elected to leave BLE below bed. Patient noted ot have increased difficulty with gaze stabilization and increased dizziness with Sit<>sidlying to the L.   VOR 1 and 2 for 45 seconds x 3 each direction with BLE and back supported . Patient noted to have increased difficulty with gaze stabilization with head nods compared to rotations, but reports increased dizziness with each bout of rotations. PT educated patient on proper speed of movement and using rest breaks as needed a the end of each bout.   Gait in hall for 76ft x 2 with rail supported on LUE to attempt re-orientation to midline and prevent lateral veer to R. Patient noted to required visual and tactile cues to improve straight gait trajectory as well as increased WC through the LUE as stable surface.   Gait with RW x  165ft, 64ft, 68ft x2. Supervision A form PT with min cues for gaze stabilization  In turns as well as to use visual markers in hall including door ways and walls rather than the floor to improve perception of midline.   Patient left sitting in recliner with Call bell in reach. At end of session.          Therapy Documentation Precautions:  Precautions Precautions: Fall Restrictions Weight Bearing Restrictions: No Pain: Pain Assessment Pain Assessment: No/denies pain  See Function Navigator for Current Functional  Status.   Therapy/Group: Individual Therapy  Lorie Phenix 10/18/2015, 12:30 PM

## 2015-10-18 NOTE — Progress Notes (Signed)
Occupational Therapy Session Note  Patient Details  Name: [name] MRN: [id number] Date of Birth: 07-Dec-1960  Today's Date: 10/18/2015 OT Individual Time: [id number] OT Individual Time Calculation (min): 60 min     Short Term Goals:Week 1:  OT Short Term Goal 1 (Week 1): STGs=LTGs due to ELOS   Skilled Therapeutic Interventions/Progress Updates:    Pt seen for skilled OT to facilitate functional mobility and activity tolerance.  Pt received in chair stating she was up early to brush teeth and sponge bathe. Pt declined shower but continued to dress from recliner using AE as needed. Reviewed with pt the recommended vestibular exercises and discussed adapting the suggestions to daily activities/ movement. Pt then integrated the suggestions with mobility to ADL apt with sit to stand to chair, BSC, tub bench. Once on tub bench pt was not able to push hips back onto bench due to short legs. Pt then tried stepping over tub ledge. She was able to do so with contact guard A on hips and using grab bars. Pt has grab bars and shower chair at home. Spouse will be able to assist pt with this transfer.    Therapy Documentation Precautions:  Precautions Precautions: Fall Restrictions Weight Bearing Restrictions: No   Pain: Pain Assessment Pain Assessment: No/denies pain ADL:    See Function Navigator for Current Functional Status.   Therapy/Group: Individual Therapy  SAGUIER,JULIA 10/18/2015, 8:28 AM

## 2015-10-18 NOTE — Progress Notes (Signed)
Physical Therapy Session Note  Patient Details  Name: [name] MRN: [id number] Date of Birth: May 25, 1960  Today's Date: 10/18/2015 PT Individual Time: 1330-1401 PT Individual Time Calculation (min): 31 min    Short Term Goals: Week 1:  PT Short Term Goal 1 (Week 1): STG=LTG due to ELOS  Skilled Therapeutic Interventions/Progress Updates:    Pt received in recliner & agreeable to PT, denying c/o pain. Pt c/o being tired and was very drowsy during session, occasionally falling asleep during rest breaks. Gait training x 100 ft + 100 ft with RW & supervision with pt demonstrating decreased gait speed, decreased step length BLE & decreased stride length. Educated pt to walk on left side of hallway to utilize wall as reference for midline/straight path. While sitting on mat table pt performed lateral pushups, 5 reps x 2 sets to both L & R sides, on elbow while stabilizing gaze on specific spot. Pt reported dizziness when performing R lateral pushups. At end of session pt left sitting in recliner with all needs within reach.  During session pt's BP = 144/71 mmHg (L forearm) and RN notified.  Therapy Documentation Precautions:  Precautions Precautions: Fall Restrictions Weight Bearing Restrictions: No  Pain: Pain Assessment Pain Assessment: No/denies pain   See Function Navigator for Current Functional Status.   Therapy/Group: Individual Therapy  Waunita Schooner 10/18/2015, 1:43 PM

## 2015-10-19 ENCOUNTER — Inpatient Hospital Stay (HOSPITAL_COMMUNITY): Payer: Self-pay | Admitting: Speech Pathology

## 2015-10-19 ENCOUNTER — Encounter (HOSPITAL_COMMUNITY): Payer: Self-pay

## 2015-10-19 ENCOUNTER — Inpatient Hospital Stay (HOSPITAL_COMMUNITY): Payer: Self-pay | Admitting: Physical Therapy

## 2015-10-19 ENCOUNTER — Inpatient Hospital Stay (HOSPITAL_COMMUNITY): Payer: Medicaid Other | Admitting: Occupational Therapy

## 2015-10-19 DIAGNOSIS — F4321 Adjustment disorder with depressed mood: Secondary | ICD-10-CM

## 2015-10-19 LAB — GLUCOSE, CAPILLARY: GLUCOSE-CAPILLARY: 109 mg/dL — AB (ref 65–99)

## 2015-10-19 LAB — CREATININE, SERUM
Creatinine, Ser: 1.01 mg/dL — ABNORMAL HIGH (ref 0.44–1.00)
GFR calc Af Amer: >60 mL/min (ref >60)
GFR calc non Af Amer: >60 mL/min (ref >60)

## 2015-10-19 NOTE — Progress Notes (Signed)
Speech Language Pathology Daily Session Note  Patient Details  Name: [name] MRN: [id number] Date of Birth: [birth year]  Today's Date: 10/19/2015  SLP Individual Time: 1130-1200 SLP Individual Time: 1500-1530 SLP Individual Time Calculation (min): 60 min   Short Term Goals: Week 1: SLP Short Term Goal 1 (Week 1): Pt complete medication management tasks at mod I. SLP Short Term Goal 2 (Week 1): Pt demonstrate money management tasks at mod I. SLP Short Term Goal 3 (Week 1): Pt demonstrate function recall of new information with use of compensatory strategies at mod I.  Skilled Therapeutic Interventions:Pt seen for skilled treatment to target cognitive goals. Pt completed money counting with 100% acc and increased time. Pt completed functional math reasoning questions with 8/10 acc in the initial session. Upon return in the second session, pt was able to identify her previous errors and self correct. Additional reasoning questions of increased complexity were then completed with min A for accurate setup.    Function:  Eating Eating                 Cognition Comprehension Comprehension assist level: Follows complex conversation/direction with extra time/assistive device  Expression   Expression assist level: Expresses complex 90% of the time/cues < 10% of the time  Social Interaction Social Interaction assist level: Interacts appropriately with others - No medications needed.  Problem Solving Problem solving assist level: Solves complex 90% of the time/cues < 10% of the time  Memory Memory assist level: Recognizes or recalls 90% of the time/requires cueing < 10% of the time    Pain Pain Assessment Pain Assessment: No/denies pain  Therapy/Group: Individual Therapy  Vinetta Bergamo MA, CCC-SLP 10/19/2015, 3:57 PM

## 2015-10-19 NOTE — Discharge Summary (Signed)
Physician Discharge Summary  Patient ID: [name] MRN: [id number] DOB/AGE: [birth year] 55 y.o.  Admit date: 10/12/2015 Discharge date: 10/20/2015  Discharge Diagnoses:  Principal Problem:   ICH (intracerebral hemorrhage) ([place]) Active Problems:   Hypokalemia   Anemia, iron deficiency   Super obese (HCC)   Prediabetes   Orthostatic hypotension   Adjustment disorder with depressed mood   Discharged Condition: stable.   Significant Diagnostic Studies:   Labs:  Basic Metabolic Panel:  Recent Labs Lab 10/16/15 1005 10/19/15 0502 10/20/15 0647  NA 138  --  139  K 3.4*  --  3.6  CL 96*  --  100*  CO2 33*  --  31  GLUCOSE 104*  --  101*  BUN 17  --  22*  CREATININE 1.12* 1.01* 1.02*  CALCIUM 9.6  --  9.6    CBC:  Recent Labs Lab 10/14/15 0435 10/16/15 1005 10/20/15 0647  WBC 6.1 6.4 7.1  NEUTROABS  --  3.2 2.9  HGB 10.6* 11.7* 11.7*  HCT 36.2 38.4 39.0  MCV 72.0* 71.2* 72.8*  PLT 349 363 362    CBG:  Recent Labs Lab 10/16/15 2039 10/17/15 0644 10/18/15 0642 10/19/15 0643 10/20/15 0631  GLUCAP 94 103* 112* 109* 110*    Brief HPI:   [name] is a 55 year old RH-female with history of morbid obesity, HTN, iron deficiency anemia who was admitted on 10/06/15 with sudden LOC and witnessed seizure activity.  She was confused on awakening and was started on Keppra and cleviprex for treatment.  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 NIHSS 0.  She did complain of chest pain and CTA chest negative for PE. Cardiology consulted due to elevated troponin and felt that elevation likely due to [place].  MRI/MRA brain with stable right temporal IPH with mild edema and evidence of chronic small micro hemorrhages. Neurology felt that [place] due to uncontrolled HTN and SBP goal  <160.   Patient with concerns about lethargy due to BP meds and clonidine being tapered.  Therapy initiated and patient limited by dizziness, intermittent nausea and balance deficits.  CIR recommended for follow up therapy.    Hospital Course: SAACHI [name] was admitted to rehab 10/12/2015 for inpatient therapies to consist of PT, ST and OT at least three hours five days a week. Past admission physiatrist, therapy team and rehab RN have worked together to provide customized collaborative inpatient rehab.  Blood pressures were monitored on bid basis and clonidine was weaned off due to continued complaints of lethargy.  Blood pressures are well controlled on current regimen. Follow up labs revealed mild hypokalemia and  K dur was added for supplement.  She has been seizure free during her stay.  Nursing did report episode of hematochezia that was felt to be due to hemorrhoids. She was started on iron due to history of iron deficiency anemia and follow up CBC has shown  improvement in H/H.   Her Po intake has been good and blood sugars have been stable on CM diet. She has been educated on carb counting as well as weight loss diet. . She has had episodic enuresis but has declined medication for treatment.   Dr. Beverly Gust was consulted for input on mood and felt that patient was experiencing depression secondary to adjustment issues. Patient declined anti-depressant and was advised to follow up for complete neuropsychological evaluation in 3-6 months if cognitive symptoms do not resolve completely.  She has made good progress during  her rehab stay and supervision is recommended due to intermittent dizziness. She will continue to receive follow up HHPT, HHST and HHOT by [facility] after discharge.    Rehab course: During patient's stay in rehab weekly team conferences were held to monitor patient's progress, set goals and discuss barriers to discharge. She required min assist and min-mod assist for ADL tasks. She reported decrease in rate of processing since stroke and ST has focused on use of compensatory strategies as well as money management tasks. She has had improvement in activity  tolerance, balance, postural control, as well as ability to compensate for deficits. She requires set up assist and is able to complete ADL tasks at modified independent to supervision level. She is ambulating 25' with RW and supervision. She is able to express needs, self correct errors and complete majority of cognitive tasks at modified independent level.  Family education was completed with husband who will provide supervision after discharge.     Disposition:  Home   Diet: Heart Healthy. Weight loss diet.   Special Instructions: 1. Drink plenty of fluids.  2. No driving for 6 months--needs to be cleared by Neurology.    Discharge Instructions    Ambulatory referral to Physical Medicine Rehab    Complete by:  As directed   1 -2 week follow up       Medication List    TAKE these medications   amLODipine 10 MG tablet Commonly known as:  NORVASC Take 1 tablet (10 mg total) by mouth daily.   chlorthalidone 25 MG tablet Commonly known as:  HYGROTON Take 1 tablet (25 mg total) by mouth daily.   fluticasone 50 MCG/ACT nasal spray Commonly known as:  FLONASE Place 1 spray into both nostrils daily as needed for allergies or rhinitis.   hydroxypropyl methylcellulose / hypromellose 2.5 % ophthalmic solution Commonly known as:  ISOPTO TEARS / GONIOVISC Place 2 drops into both eyes as needed (irritation).   iron polysaccharides 150 MG capsule Commonly known as:  NIFEREX Take 1 capsule (150 mg total) by mouth 2 times daily at 12 noon and 4 pm.   levETIRAcetam 750 MG tablet Commonly known as:  KEPPRA Take 1 tablet (750 mg total) by mouth 2 (two) times daily.   metoprolol 50 MG tablet Commonly known as:  LOPRESSOR Take 1 tablet (50 mg total) by mouth 2 (two) times daily.   potassium chloride SA 20 MEQ tablet Commonly known as:  K-DUR,KLOR-CON Take 1 tablet (20 mEq total) by mouth daily.      Follow-up [facility] Follow up on  10/23/2015.   Why:  @ 10:30 am with Zettie Pho, PA (arrive @ 10:15) To establish with a primary care practice. Will need follow up sleep study. Needs follow up MRI/MRA with and without contrast/MRV of brain --check with neuro on timing.  Contact information: [place] [phone]       Ankit Anil Patel, MD .   Specialty:  Physical Medicine and Rehabilitation Why:  office will call you with follow up appointment Contact information: [address] [place] [address] [phone]        Xu,Jindong, MD Follow up in 3 day(s).   Specialty:  Neurology Why:  call 3 days for follow up appointment in 4 weeks.  Contact information: [address] [place] [address] [phone]  SignedBary Leriche 10/20/2015, 6:56 PM

## 2015-10-19 NOTE — Progress Notes (Signed)
[facility] PHYSICAL MEDICINE & REHABILITATION     PROGRESS NOTE  Subjective/Complaints:  Pt working with PT this AM.  She is in better spirits and is looking forward to her discharge this Sat.   ROS:  Negative CP, SOB, N/V/D.  Objective: Vital Signs: Blood pressure (!) 144/76, pulse 71, temperature 98.1 F (36.7 C), temperature source Oral, resp. rate 18, weight 118.9 kg (262 lb 2 oz), last menstrual period 02/17/2011, SpO2 97 %. No results found.  Recent Labs  10/16/15 1005  WBC 6.4  HGB 11.7*  HCT 38.4  PLT 363    Recent Labs  10/16/15 1005 10/19/15 0502  NA 138  --   K 3.4*  --   CL 96*  --   GLUCOSE 104*  --   BUN 17  --   CREATININE 1.12* 1.01*  CALCIUM 9.6  --    CBG (last 3)   Recent Labs  10/17/15 0644 10/18/15 0642 10/19/15 0643  GLUCAP 103* 112* 109*    Wt Readings from Last 3 Encounters:  10/19/15 118.9 kg (262 lb 2 oz)  10/06/15 120.8 kg (266 lb 5.1 oz)    Physical Exam:  BP (!) 144/76   Pulse 71   Temp 98.1 F (36.7 C) (Oral)   Resp 18   Wt 118.9 kg (262 lb 2 oz)   LMP 02/17/2011   SpO2 97%   BMI 52.94 kg/m  Constitutional: She appears well-developed and well-nourished. Morbidly obese.  HENT: Normocephalic and atraumatic.  Mouth/Throat: Oropharynx is clear and moist.  Eyes: Conjunctivae and EOM are normal. Cardiovascular: Normal rate and regular rhythm.Murmur heard. Respiratory: Effort normal and breath sounds normal. No stridor. No respiratory distress.  GI: Soft. Bowel sounds are normal. She exhibits no distension. There is no tenderness.  Musculoskeletal: She exhibits no edema or tenderness.  Neurological: She is alert and oriented.  Motor strength is 4+/5, bilateral deltoid, biceps, triceps, grip, hip flexor, knee extensors, ankle dorsi/plantar flexors. Skin: Skin is warm and dry.  Psychiatric: She has a normal mood and affect. Her behavior is normal. Thought content normal.   Assessment/Plan: 1. Functional deficits  secondary to right temporal intracranial hemorrhage, complicated by morbid obesity which require 3+ hours per day of interdisciplinary therapy in a comprehensive inpatient rehab setting. Physiatrist is providing close team supervision and 24 hour management of active medical problems listed below. Physiatrist and rehab team continue to assess barriers to discharge/monitor patient progress toward functional and medical goals.  Function:  Bathing Bathing position Bathing activity did not occur: N/A (unable due to lethargy) Position: Shower  Bathing parts Body parts bathed by patient: Right arm, Left arm, Chest, Abdomen, Front perineal area, Buttocks, Right upper leg, Left upper leg, Right lower leg, Left lower leg, Back    Bathing assist Assist Level: Set up      Upper Body Dressing/Undressing Upper body dressing Upper body dressing/undressing activity did not occur: N/A (unable due to lethargy) What is the patient wearing?: Pull over shirt/dress     Pull over shirt/dress - Perfomed by patient: Thread/unthread right sleeve, Thread/unthread left sleeve, Put head through opening, Pull shirt over trunk          Upper body assist Assist Level: Set up      Lower Body Dressing/Undressing Lower body dressing Lower body dressing/undressing activity did not occur: N/A (unable due to lethargy) What is the patient wearing?: Underwear, Pants, Non-skid slipper socks Underwear - Performed by patient: Thread/unthread right underwear leg, Thread/unthread left underwear leg,  Pull underwear up/down   Pants- Performed by patient: Thread/unthread right pants leg, Thread/unthread left pants leg, Pull pants up/down   Non-skid slipper socks- Performed by patient: Don/doff right sock, Don/doff left sock                    Lower body assist Assist for lower body dressing: Set up      Toileting Toileting   Toileting steps completed by patient: Adjust clothing prior to toileting, Performs  perineal hygiene, Adjust clothing after toileting Toileting steps completed by helper: Adjust clothing after toileting Toileting Assistive Devices: Grab bar or rail  Toileting assist Assist level: Supervision or verbal cues   Transfers Chair/bed transfer   Chair/bed transfer method: Ambulatory Chair/bed transfer assist level: Supervision or verbal cues Chair/bed transfer assistive device: Medical sales representative     Max distance: 150 ft Assist level: Supervision or verbal cues   Wheelchair   Type: Manual Max wheelchair distance: 1108ft Assist Level: Supervision or verbal cues  Cognition Comprehension Comprehension assist level: Follows complex conversation/direction with no assist  Expression Expression assist level: Expresses complex ideas: With no assist  Social Interaction Social Interaction assist level: Interacts appropriately with others - No medications needed.  Problem Solving Problem solving assist level: Solves complex problems: Recognizes & self-corrects  Memory Memory assist level: Complete Independence: No helper    Medical Problem List and Plan: 1. 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 DVT Prophylaxis/Anticoagulation: Pharmaceutical: Lovenox 3. Pain Management: tylenol prn for knee pain.  4. Mood: LCSW to follow for evaluation and support.  5. Neuropsych: This patient is capable of making decisions on her own behalf.  She notes depression, but refuses medication at present.  She appears to be doing better at present.   6. Skin/Wound Care: Routine pressure relief measures 7. Fluids/Electrolytes/Nutrition: Monitor I/O.   Mild hypokalemia: 3.4 on 7/24, supplement increased 8. HTN: Monitor bid. Continue catapres, hygroton, and metoprolol 9. Iron deficiency anemia: Added iron supplement.   Hb 11.7 on 7/24. 10. New onset seizures: Cont Keppra bid.  11. Diarrhea: discontinued  laxatives.  12. Prediabetes/Morbid obesity: Encourage weight loss. Will consult dietician to help educate on diet.   CBGs fairly well controlled, d/c on 7/28 13. Dizziness:Check orthostatic vitals.  14. Supplemental Oxygen dependent: Will wean as tolerated 15. Urinary incont- check UA 16.Hematochezia- Resolved  Likely hemmorhoid 17. Super Obese  Body mass index is 52.94 kg/m.  Diet and exercise education  Will cont to encourage weight loss to increase endurance and promote overall health 18. Urinary incontinence   Pt refusing medications at present  Timed voiding  PVRs remain pending  LOS (Days) 7 A FACE TO FACE EVALUATION WAS PERFORMED  Montarius Kitagawa Lorie Phenix 10/19/2015 9:16 AM

## 2015-10-19 NOTE — Progress Notes (Signed)
Physical Therapy Session Note  Patient Details  Name: [name] MRN: [id number] Date of Birth: [birth year]  Today's Date: 10/19/2015 PT Individual Time: 0801-0900 AND [phone] PT Individual Time Calculation (min): 59 min AND 33 min     Short Term Goals: Week 1:  PT Short Term Goal 1 (Week 1): STG=LTG due to ELOS  Skilled Therapeutic Interventions/Progress Updates:     Patient received sitting EOB Finishing breakfast, and agreeable to PT.  PT assisted patient in donning Lower body with min A to loop pants over feet and no assist with Upper body dressing. Patient utilized sit<>stand with RW with supervision A to pull pants to waist. Patient reports mild increase in dizziness with looking down to locate pants.  Patient ambulated in room for 63ft to get to BR for bowel moveemnt with RW and no cues or assistance from PT.   Toilet transfer with use of grab bars with supervision A.  Perineal hygiene without cues or assistance from PT.   Gait to rehab gym x 163ft and 121ft with supervision A from PT. Patient demonstrated improved gait speed and improved straight trajectory with increased orientation to min midline.   Endurance training on nustep level 2x 2 min, level 6 x 4 min, level 5x 2 min and level 2 x 2 min cool down.   Car transfer instructed by PT with supervision A. Mod cues for improved UE placement and pelvic positioning to allow LE to vcome into car with difficulty from body habitus. Patient note to have signifciant head rotation and vertical movement without any increase in dizziness.   Session 2:   Patient received sitting in WC and agreeable to PT.  PT instructed patient in gait training for 118ft x 2 with supervision A and min cues for increased WB through LUE to encourage improved awareness of midline orientation. Patient demonstrated decreased veer to R following instruction from PT and improved gait speed .   PT instructed patient in Berg balance test; See below for  results.  Patient left sitting in Recliner with call bell in reach.       Therapy Documentation Precautions:  Precautions Precautions: Fall Restrictions Weight Bearing Restrictions: No General:   Vital Signs: Therapy Vitals Temp: 98.1 F (36.7 C) Temp Source: Oral Pulse Rate: 72 Resp: 18 BP: 109/65 Patient Position (if appropriate): Sitting Oxygen Therapy SpO2: 97 % O2 Device: Not Delivered Pain: Pain Assessment Pain Assessment: No/denies pain  Balance: Balance Balance Assessed: Yes Standardized Balance Assessment Standardized Balance Assessment: Berg Balance Test Berg Balance Test Sit to Stand: Able to stand without using hands and stabilize independently Standing Unsupported: Able to stand safely 2 minutes Sitting with Back Unsupported but Feet Supported on Floor or Stool: Able to sit safely and securely 2 minutes Stand to Sit: Sits safely with minimal use of hands Transfers: Able to transfer safely, definite need of hands Standing Unsupported with Eyes Closed: Able to stand 10 seconds safely Standing Ubsupported with Feet Together: Able to place feet together independently and stand for 1 minute with supervision From Standing, Reach Forward with Outstretched Arm: Can reach confidently >25 cm (10") From Standing Position, Pick up Object from Floor: Able to pick up shoe, needs supervision From Standing Position, Turn to Look Behind Over each Shoulder: Looks behind one side only/other side shows less weight shift Turn 360 Degrees: Able to turn 360 degrees safely but slowly Standing Unsupported, Alternately Place Feet on Step/Stool: Able to stand independently and complete 8 steps >20 seconds Standing  Unsupported, One Foot in Front: Able to plae foot ahead of the other independently and hold 30 seconds Standing on One Leg: Tries to lift leg/unable to hold 3 seconds but remains standing independently Total Score: 45 Patient demonstrates increased fall risk as noted  by score of  45 /56 on Berg Balance Scale.  (<36= high risk for falls, close to 100%; 37-45 significant >80%; 46-51 moderate >50%; 52-55 lower >25%)   See Function Navigator for Current Functional Status.   Therapy/Group: Individual Therapy  Lorie Phenix 10/19/2015, 6:17 PM

## 2015-10-19 NOTE — Progress Notes (Signed)
Occupational Therapy Session Note  Patient Details  Name: [name] MRN: [id number] Date of Birth: September 26, 1960  Today's Date: 10/19/2015 OT Individual Time: 1000-1100 OT Individual Time Calculation (min): 60 min     Short Term Goals: Week 1:  OT Short Term Goal 1 (Week 1): STGs=LTGs due to ELOS   Skilled Therapeutic Interventions/Progress Updates:     Treatment session with focus on self care skills. Pt received seated in recliner of room, awake, alert, and agreeable to therapy. Pt stated feeling dizzy but at overall manageable rating. Pt ambulated to shower using RW with close supervision / [place] for safety.   Discussed education pt received thus far regarding management of vertigo symptoms such as looking at fixed object directly in front of visual field. Pt stated this to be helpful thus far and demonstrated carryover while completing showering tasks. Verbal cues given to pt to apply this learning x3 during shower due to pt turning around while talking, and when pt bending over to clean BLEs rather than using long handled sponge. Pt verbalized understanding.   Energy conservation ideas problem solved with pt for improved occupational performance and engagement. Pt completed showering and dressing tasks with intermittent rest breaks to decrease feelings of dizziness and reserve energy for later tasks.   Pt ambulated back to recliner in room with close S and left with all needs in reach.   Therapy Documentation Precautions:  Precautions Precautions: Fall Restrictions Weight Bearing Restrictions: No General:   Vital Signs: Therapy Vitals Temp: 98.1 F (36.7 C) Temp Source: Oral Pulse Rate: 72 Resp: 18 BP: 109/65 Patient Position (if appropriate): Sitting Oxygen Therapy SpO2: 97 % O2 Device: Not Delivered Pain:   Pt with no c/o pain.   See Function Navigator for Current Functional Status.   Therapy/Group: Individual Therapy  Dierdre Searles 10/19/2015, 3:18 PM

## 2015-10-20 ENCOUNTER — Inpatient Hospital Stay (HOSPITAL_COMMUNITY): Payer: Medicaid Other | Admitting: Occupational Therapy

## 2015-10-20 ENCOUNTER — Inpatient Hospital Stay (HOSPITAL_COMMUNITY): Payer: Self-pay | Admitting: Physical Therapy

## 2015-10-20 ENCOUNTER — Inpatient Hospital Stay (HOSPITAL_COMMUNITY): Payer: Medicaid Other | Admitting: Speech Pathology

## 2015-10-20 DIAGNOSIS — F4323 Adjustment disorder with mixed anxiety and depressed mood: Secondary | ICD-10-CM

## 2015-10-20 LAB — BASIC METABOLIC PANEL
Anion gap: 8 (ref 5–15)
BUN: 22 mg/dL — AB (ref 6–20)
CO2: 31 mmol/L (ref 22–32)
Calcium: 9.6 mg/dL (ref 8.9–10.3)
Chloride: 100 mmol/L — ABNORMAL LOW (ref 101–111)
Creatinine, Ser: 1.02 mg/dL — ABNORMAL HIGH (ref 0.44–1.00)
GFR calc Af Amer: >60 mL/min (ref >60)
GLUCOSE: 101 mg/dL — AB (ref 65–99)
POTASSIUM: 3.6 mmol/L (ref 3.5–5.1)
Sodium: 139 mmol/L (ref 135–145)

## 2015-10-20 LAB — CBC WITH DIFFERENTIAL/PLATELET
BASOS PCT: 0 %
Basophils Absolute: 0 10*3/uL (ref 0.0–0.1)
EOS ABS: 0.1 10*3/uL (ref 0.0–0.7)
EOS PCT: 2 %
HCT: 39 % (ref 36.0–46.0)
Hemoglobin: 11.7 g/dL — ABNORMAL LOW (ref 12.0–15.0)
LYMPHS ABS: 3.6 10*3/uL (ref 0.7–4.0)
Lymphocytes Relative: 50 %
MCH: 21.8 pg — AB (ref 26.0–34.0)
MCHC: 30 g/dL (ref 30.0–36.0)
MCV: 72.8 fL — ABNORMAL LOW (ref 78.0–100.0)
MONOS PCT: 7 %
Monocytes Absolute: 0.5 10*3/uL (ref 0.1–1.0)
NEUTROS ABS: 2.9 10*3/uL (ref 1.7–7.7)
Neutrophils Relative %: 41 %
PLATELETS: 362 10*3/uL (ref 150–400)
RBC: 5.36 MIL/uL — ABNORMAL HIGH (ref 3.87–5.11)
RDW: 17.7 % — ABNORMAL HIGH (ref 11.5–15.5)
WBC: 7.1 10*3/uL (ref 4.0–10.5)

## 2015-10-20 LAB — GLUCOSE, CAPILLARY: GLUCOSE-CAPILLARY: 110 mg/dL — AB (ref 65–99)

## 2015-10-20 MED ORDER — POLYSACCHARIDE IRON COMPLEX 150 MG PO CAPS: 150.0000 mg | ORAL_CAPSULE | Freq: Two times a day (BID) | ORAL | 0 refills | Status: DC

## 2015-10-20 MED ORDER — POTASSIUM CHLORIDE CRYS ER 20 MEQ PO TBCR: 20.0000 meq | EXTENDED_RELEASE_TABLET | Freq: Every day | ORAL | 0 refills | Status: DC

## 2015-10-20 MED ORDER — LEVETIRACETAM 750 MG PO TABS: 750.0000 mg | ORAL_TABLET | Freq: Two times a day (BID) | ORAL | 0 refills | Status: DC

## 2015-10-20 MED ORDER — AMLODIPINE BESYLATE 10 MG PO TABS: 10.0000 mg | ORAL_TABLET | Freq: Every day | ORAL | 0 refills | Status: DC

## 2015-10-20 MED ORDER — METOPROLOL TARTRATE 50 MG PO TABS: 50.0000 mg | ORAL_TABLET | Freq: Two times a day (BID) | ORAL | 0 refills | Status: DC

## 2015-10-20 MED ORDER — CHLORTHALIDONE 25 MG PO TABS: 25.0000 mg | ORAL_TABLET | Freq: Every day | ORAL | 0 refills | Status: DC

## 2015-10-20 MED ORDER — HYPROMELLOSE (GONIOSCOPIC) 2.5 % OP SOLN: 2.0000 [drp] | OPHTHALMIC | 12 refills | Status: DC | PRN

## 2015-10-20 NOTE — Discharge Instructions (Signed)
Inpatient Rehab Discharge Instructions  [name] Discharge date and time: 10/21/15   Activities/Precautions/ Functional Status: Activity: no lifting, driving, or strenuous exercise for till cleared by MD.  Diet: cardiac diet and diabetic diet Wound Care: none needed   Functional status:  ___ No restrictions     ___ Walk up steps independently _X__ 24/7 supervision/assistance   ___ Walk up steps with assistance ___ Intermittent supervision/assistance  ___ Bathe/dress independently _X__ Walk with walker    _X__ Bathe/dress with assistance ___ Walk Independently    ___ Shower independently ___ Walk with assistance    ___ Shower with assistance _X__ No alcohol     ___ Return to work/school ________   COMMUNITY REFERRALS UPON DISCHARGE:    Home Health:   PT     OT     ST                       Agency: [facility] Phone: [phone]   Medical Equipment/Items Ordered: walker, tub bench                                                     Agency/Supplier: [facility] RESOURCES FOR PATIENT/FAMILY:  Support Groups:  Stroke Support Group                                2nd Thursday of each month @ 3:00 pm                              [facility] Day[facility]                              Contact:  Benay Pillow, [place] @ [phone]    Special Instructions: 1. NO driving for 6 months--has to be seizure free and get clearance from Neurology prior to resuming driving.  2. Drink plenty of fluids. 3.No smoking. 4. Take medications as prescribed.   STROKE/TIA DISCHARGE INSTRUCTIONS SMOKING Cigarette smoking nearly doubles your risk of having a stroke & is the single most alterable risk factor  If you smoke or have smoked in the last 12 months, you are advised to quit smoking for your health.  Most of the excess cardiovascular risk related to smoking disappears within a year of stopping.  Ask you doctor about anti-smoking  medications  [place] Quit Line: 1-800-QUIT NOW  Free Smoking Cessation Classes [phone]  CHOLESTEROL Know your levels; limit fat & cholesterol in your diet  Lipid Panel     Component Value Date/Time   CHOL 112 10/08/2015 0541   TRIG 62 10/08/2015 0541   HDL 42 10/08/2015 0541   CHOLHDL 2.7 10/08/2015 0541   VLDL 12 10/08/2015 0541   LDLCALC 58 10/08/2015 0541      Many patients benefit from treatment even if their cholesterol is at goal.  Goal: Total Cholesterol (CHOL) less than 160  Goal:  Triglycerides (TRIG) less than 150  Goal:  HDL greater than 40  Goal:  LDL (LDLCALC) less than 100   BLOOD PRESSURE American Stroke Association blood pressure target is less that 120/80 mm/Hg  Your  discharge blood pressure is:  BP: 121/70  Monitor your blood pressure  Limit your salt and alcohol intake  Many individuals will require more than one medication for high blood pressure  DIABETES (A1c is a blood sugar average for last 3 months) Goal HGBA1c is under 7% (HBGA1c is blood sugar average for last 3 months)  Diabetes: No known diagnosis of diabetes    Lab Results  Component Value Date   HGBA1C 5.7 (H) 10/07/2015     Your HGBA1c can be lowered with medications, healthy diet, and exercise.  Check your blood sugar as directed by your physician  Call your physician if you experience unexplained or low blood sugars.  PHYSICAL ACTIVITY/REHABILITATION Goal is 30 minutes at least 4 days per week  Activity: No driving, Therapies:  See above Return to work: N/A  Activity decreases your risk of heart attack and stroke and makes your heart stronger.  It helps control your weight and blood pressure; helps you relax and can improve your mood.  Participate in a regular exercise program.  Talk with your doctor about the best form of exercise for you (dancing, walking, swimming, cycling).  DIET/WEIGHT Goal is to maintain a healthy weight  Your discharge diet is: Diet Carb Modified  Fluid consistency:: Thin; Room service appropriate?: Yes  liquids Your height is:  Height: 5\' 3"  (160 cm) Your current weight is: Weight: 116.6 kg (257 lb 0.9 oz) Your Body Mass Index (BMI) is:  BMI (Calculated): 46.5  Following the type of diet specifically designed for you will help prevent another stroke.  Your goal weight is: 141 lbs  Your goal Body Mass Index (BMI) is 19-24.  Healthy food habits can help reduce 3 risk factors for stroke:  High cholesterol, hypertension, and excess weight.  RESOURCES Stroke/Support Group:  Call [phone]   STROKE EDUCATION PROVIDED/REVIEWED AND GIVEN TO PATIENT Stroke warning signs and symptoms How to activate emergency medical system (call 911). Medications prescribed at discharge. Need for follow-up after discharge. Personal risk factors for stroke. Pneumonia vaccine given:  Flu vaccine given:  My questions have been answered, the writing is legible, and I understand these instructions.  I will adhere to these goals & educational materials that have been provided to me after my discharge from the hospital.    FOR SAFETY, [place].  ABSOLUTELY NO DRIVING UNTIL OTHERWISE DIRECTED BY A PHYSICIAN. CAR KEYS SHOULD BE HIDDEN IN A SAFE LOCATION IF NEEDED.  DUE TO RISK OF INJURY, PLEASE REMOVE GUNS, KNIVES, OR ANY OTHER POTENTIALLY DANGEROUS OBJECTS AND HAZARDOUS MATERIALS FROM THE HOME.    My questions have been answered and I understand these instructions. I will adhere to these goals and the provided educational materials after my discharge from the hospital.  Patient/Caregiver Signature _______________________________ Date __________  Clinician Signature _______________________________________ Date __________  Please bring this form and your medication list with you to all your follow-up doctor's appointments.

## 2015-10-20 NOTE — Progress Notes (Signed)
[facility] PHYSICAL MEDICINE & REHABILITATION     PROGRESS NOTE  Subjective/Complaints:  Pt sitting up in her chair.  She is looking forward to going home tomorrow.  She is still upset about incontinence overnight, however, still refuses medications.  Educated on fluid restriction at night and timed voiding.    ROS:  Denies CP, SOB, N/V/D.  Objective: Vital Signs: Blood pressure 116/68, pulse 86, temperature 98.5 F (36.9 C), temperature source Oral, resp. rate 18, height 5\' 3"  (1.6 m), weight 116.6 kg (257 lb 0.9 oz), last menstrual period 02/17/2011, SpO2 100 %. No results found.  Recent Labs  10/20/15 0647  WBC 7.1  HGB 11.7*  HCT 39.0  PLT 362    Recent Labs  10/19/15 0502 10/20/15 0647  NA  --  139  K  --  3.6  CL  --  100*  GLUCOSE  --  101*  BUN  --  22*  CREATININE 1.01* 1.02*  CALCIUM  --  9.6   CBG (last 3)   Recent Labs  10/18/15 0642 10/19/15 0643  GLUCAP 112* 109*    Wt Readings from Last 3 Encounters:  10/20/15 116.6 kg (257 lb 0.9 oz)  10/06/15 120.8 kg (266 lb 5.1 oz)    Physical Exam:  BP 116/68   Pulse 86   Temp 98.5 F (36.9 C) (Oral)   Resp 18   Ht 5\' 3"  (1.6 m)   Wt 116.6 kg (257 lb 0.9 oz)   LMP 02/17/2011   SpO2 100%   BMI 45.54 kg/m  Constitutional: She appears well-developed and well-nourished. Morbidly obese.  HENT: Normocephalic and atraumatic.  Mouth/Throat: Oropharynx is clear and moist.  Eyes: Conjunctivae and EOM are normal. Cardiovascular: Normal rate and regular rhythm.Murmur heard. Respiratory: Effort normal and breath sounds normal. No stridor. No respiratory distress.  GI: Soft. Bowel sounds are normal. She exhibits no distension. There is no tenderness.  Musculoskeletal: She exhibits no edema or tenderness.  Neurological: She is alert and oriented.  Motor strength is 4+/5, bilateral deltoid, biceps, triceps, hip flexor,  5/5 bilateral hand grip,knee extensors, ankle dorsi/plantar flexors. Skin: Skin is  warm and dry.  Psychiatric: She has a normal mood and affect. Her behavior is normal. Thought content normal.   Assessment/Plan: 1. 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 Physiatrist is providing close team supervision and 24 hour management of active medical problems listed below. Physiatrist and rehab team continue to assess barriers to discharge/monitor patient progress toward functional and medical goals.  Function:  Bathing Bathing position Bathing activity did not occur: N/A (unable due to lethargy) Position: Shower  Bathing parts Body parts bathed by patient: Right arm, Left arm, Chest, Abdomen, Front perineal area, Buttocks, Right upper leg, Left upper leg, Right lower leg, Left lower leg Body parts bathed by helper: Back  Bathing assist Assist Level: Set up   Set up : To obtain items  Upper Body Dressing/Undressing Upper body dressing Upper body dressing/undressing activity did not occur: N/A (unable due to lethargy) What is the patient wearing?: Pull over shirt/dress, Bra Bra - Perfomed by patient: Thread/unthread right bra strap, Thread/unthread left bra strap, Hook/unhook bra (pull down sports bra)   Pull over shirt/dress - Perfomed by patient: Thread/unthread right sleeve, Thread/unthread left sleeve, Put head through opening, Pull shirt over trunk          Upper  body assist Assist Level: Set up      Lower Body Dressing/Undressing Lower body dressing Lower body dressing/undressing activity did not occur: N/A (unable due to lethargy) What is the patient wearing?: Underwear, Pants, Socks, Shoes Underwear - Performed by patient: Thread/unthread right underwear leg, Thread/unthread left underwear leg, Pull underwear up/down   Pants- Performed by patient: Thread/unthread right pants leg, Thread/unthread left  pants leg, Pull pants up/down   Non-skid slipper socks- Performed by patient: Don/doff right sock, Don/doff left sock     Socks - Performed by helper: Don/doff right sock, Don/doff left sock Shoes - Performed by patient: Don/doff right shoe, Don/doff left shoe            Lower body assist Assist for lower body dressing: Touching or steadying assistance (Pt > 75%)   Set up : To obtain clothing/put away  Toileting Toileting   Toileting steps completed by patient: Adjust clothing prior to toileting, Performs perineal hygiene, Adjust clothing after toileting Toileting steps completed by helper: Adjust clothing after toileting Toileting Assistive Devices: Grab bar or rail  Toileting assist Assist level: Supervision or verbal cues   Transfers Chair/bed transfer   Chair/bed transfer method: Ambulatory Chair/bed transfer assist level: Supervision or verbal cues Chair/bed transfer assistive device: Armrests, Medical sales representative     Max distance: 168ft Assist level: Supervision or verbal cues   Wheelchair   Type: Manual Max wheelchair distance: 150ft Assist Level: Supervision or verbal cues  Cognition Comprehension Comprehension assist level: Follows complex conversation/direction with extra time/assistive device  Expression Expression assist level: Expresses complex 90% of the time/cues < 10% of the time  Social Interaction Social Interaction assist level: Interacts appropriately with others - No medications needed.  Problem Solving Problem solving assist level: Solves complex 90% of the time/cues < 10% of the time  Memory Memory assist level: Recognizes or recalls 90% of the time/requires cueing < 10% of the time    Medical Problem List and Plan: 1. 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 DVT Prophylaxis/Anticoagulation: Pharmaceutical: Lovenox 3. Pain Management: tylenol prn for knee pain.   4. Mood: LCSW to follow for evaluation and support.  5. Neuropsych: This patient is capable of making decisions on her own behalf.  She notes depression, but refuses medication at present.  She appears to be doing better at present.   6. Skin/Wound Care: Routine pressure relief measures 7. Fluids/Electrolytes/Nutrition: Monitor I/O.   Mild hypokalemia: Resolved on 7/28, cont to supplement 8. HTN: Monitor bid. Continue catapres, hygroton, and metoprolol 9. Iron deficiency anemia: Added iron supplement.   Hb 11.7 on 7/28. 10. New onset seizures: Cont Keppra bid.  11. Diarrhea: discontinued laxatives.  12. Prediabetes/Morbid obesity: Encourage weight loss. Will consult dietician to help educate on diet.   CBGs fairly well controlled, d/ced on 7/28 13. Dizziness:Check orthostatic vitals.  14. Supplemental Oxygen dependent: Will wean as tolerated 15. Urinary incont- check UA 16.Hematochezia- Resolved  Likely hemmorhoid 17. Super Obese  Body mass index is 45.54 kg/m.  Diet and exercise education  Will cont to encourage weight loss to increase endurance and promote overall health 18. Urinary incontinence   Pt refusing medications at present  Educated on night time fluid restriction and timed voiding  PVRs remain pending  LOS (Days) 8 A FACE TO FACE EVALUATION WAS PERFORMED  Kynzie Polgar Lorie Phenix 10/20/2015 9:05 AM

## 2015-10-20 NOTE — Progress Notes (Signed)
Occupational Therapy Session Note  Patient Details  Name: KUMIKO SAUTTER MRN: [id number] Date of Birth: 1961/03/03  Today's Date: 10/20/2015 OT Individual Time: [id number] minutes    Short Term Goals: Week 1:  OT Short Term Goal 1 (Week 1): STGs=LTGs due to ELOS   Skilled Therapeutic Interventions/Progress Updates:    Treatment session with focus on self care skills and pt education. Pt completed bathing and dressing at sink at overall Mod I. Pt ambulated to ADL apartment with RW under S. Reviewed previous pt education given regarding tub/shower combo transfers. Problem solved pt transfers to/from tub shower with tub bench as opposed to shower chair with pt demonstrating current transfer method. Recommended tub bench transfers for improved safety and decreased burden of care. Pt verbalized agreement.   Pt demonstrated simulated simple meal prep in kitchen. Pt reports assist from husband when retrieving items from high cupboards. Problem solved item retrieval and moving when using RW in kitchen in addition to general safety measures. Pt verbalized agreement.   Left pt in recliner with all needs in reach and husband in room.   Therapy Documentation Precautions:  Precautions Precautions: Fall Restrictions Weight Bearing Restrictions: No General:   Vital Signs: Therapy Vitals Pulse Rate: 86 BP: 116/68 Pain:   ADL:   Exercises:   Other Treatments:    See Function Navigator for Current Functional Status.   Therapy/Group: Individual Therapy  Dierdre Searles 10/20/2015, 12:16 PM

## 2015-10-20 NOTE — Consult Note (Signed)
PSYCHODIAGNOSTIC EVALUATION - CONFIDENTIAL [facility]   Mrs. [name] is a 55 year old, African American woman, who was seen for an initial psychodiagnostic examination to assess for potential depression, anxiety, or other mental illness in the setting of intracerebral hemorrhage and seizure.    During the session, Mrs. [name] acknowledged temporary depression, onset of which is typically in the morning and is triggered by worries over becoming a burden on her family, loss of independence, and cognitive changes.  She noted that she has been forgetful of some of her caretakers or doctors in the hospital, has been disoriented at times, and has noticed word-finding problems.  Upon questioning, she also reported slowed processing speed and worry over her ability to multi-task.  Mrs. [name] said that during depressive episodes she has wished, at times, that she would have been killed by her stroke, though she did not report active suicidal ideation.  She stated that her depression usually resolves when she is able to talk to a nurse, aide, or family member and regain her positivity.  She has also been speaking with a friend who had a stroke in order to get support from someone who has been through something similar.  Mrs. [name] commented at one point that she has felt that her husband does not understand fully the ramifications of her stroke (e.g. urinary incontinence) and she is worried about potentially wetting the bed she shares with him and having him become upset.  She said that it is helpful when her husband gives her positive feedback (e.g. "you did great today") and it would be nice if he would do that more.  Mrs. [name] was encouraged to seek further support as an outpatient and she expressed interest in participating in individual psychotherapy post-discharge.  She stated that in the meantime, she will work toward continuing to make those around her smile, as that is one of her  greatest joys in life.    IMPRESSION:  Mrs. [name] seems to be experiencing depressive symptoms secondary to adjustment to illness.  During today's session time was spent validating and normalizing her concerns.  She likely does not require medication to aid in mood enhancement at this time.  Education was provided regarding risk for worsening depression post-discharge and Mrs. [name] agreed to inform her care team should she notice symptoms of increasing depression.  In addition, contact information for individual psychotherapy in her area should be included in her discharge paperwork.  From a cognitive standpoint, Mrs. [name] described multiple symptoms that have likely resulted from her stroke.  She was advised to seek a comprehensive outpatient neuropsychological evaluation in 3-6 months if her cognitive symptoms do not completely resolve before then.    DIAGNOSIS:   Adjustment disorder with depressed mood  Marlane Hatcher, Psy.D.  Clinical Neuropsychologist

## 2015-10-20 NOTE — Progress Notes (Signed)
Speech Language Pathology Discharge Summary  Patient Details  Name: LAURAN ROMANSKI MRN: [id number] Date of Birth: [birth year]  Today's Date: 10/20/2015 SLP Individual Time: 1300-1400 SLP Individual Time Calculation (min): 60 min    Skilled Therapeutic Interventions:  Pt completed moderate-complex functional math word problems with 80% mod I. With reminder to double check her work, pt was able to increase accuracy to 90%. Pt was aware of difficulty with 2 items that were incorrect and was able to complete them accurately with min A, ex. pt had confused 1hr 40 min with 140 min. Discussed need for supervision with financial and medication management due to requirement for 100% acc. Pt verbalized agreement and indicated that her daughter would be a suitable person to help her. Recall of daily activities was 100% acc at mod I.    Patient has met 2 of 2 long term goals.  Patient to discharge at overall Modified Independent level.  Reasons goals not met: Pt generally met 2 of 2 long term goals, but she would benefit from supervision with complex cogntive tasks such as medication management and financial management to insure there are no mistakes made. Pt would benefit from outpatient therapy services to maximize her ability to function independently.    Clinical Impression/Discharge Summary: Pt is able to complete the majority of cognitive tasks at mod I, but continues to demonstrate slow information processing and feels that she has not returned to baseline. Recommend Outpatient SLP services.  Care Partner:  Caregiver Able to Provide Assistance: Yes  Type of Caregiver Assistance: Cognitive  Recommendation:  Outpatient SLP  Rationale for SLP Follow Up: Maximize cognitive function and independence;Reduce caregiver burden   Equipment:     Reasons for discharge: Discharged from hospital   Patient/Family Agrees with Progress Made and Goals Achieved: Yes   Function:  Eating Eating   Modified  Consistency Diet: No Eating Assist Level: No help, No cues           Cognition Comprehension Comprehension assist level: Follows complex conversation/direction with extra time/assistive device  Expression   Expression assist level: Expresses complex ideas: With extra time/assistive device  Social Interaction Social Interaction assist level: Interacts appropriately with others - No medications needed.  Problem Solving Problem solving assist level: Solves complex problems: With extra time  Memory Memory assist level: Recognizes or recalls 90% of the time/requires cueing < 10% of the time   Vinetta Bergamo MA, CCC-SLP 10/20/2015, 3:35 PM

## 2015-10-20 NOTE — Progress Notes (Signed)
Physical Therapy Discharge Summary  Patient Details  Name: [name] MRN: [id number] Date of Birth: 10/30/1960  Today's Date: 10/20/2015 PT Individual Time:  -  [id number] Calculated individual treatment time: 59 min        Patient has met 10 of 11 long term goals due to improved activity tolerance, improved balance, improved postural control, ability to compensate for deficits and improved awareness.  Patient to discharge at an ambulatory level Supervision.   Patient's care partner is independent to provide the necessary physical assistance at discharge.  Reasons goals not met: Continued dizziness secondary to movement sensitivity prevents independent bed mobility.  Recommendation:  Patient will benefit from ongoing skilled PT services in home health setting to continue to advance safe functional mobility, address ongoing impairments in Balance, endurance, gait, and minimize fall risk.  Equipment: RW  Reasons for discharge: RW  Patient/family agrees with progress made and goals achieved: Yes   PT treatment:  Patient instructed in grad day activities; see below for results.   Patient instructed by PT in car transfer with supervision A from PT for safety and gaze stabilization to reduce dizziness. Gait training over level surface for 191f, 1567f [id number] [id number] RW and supervision A. Patient continues to note slight tilt in orientation to midline, but demonstrates decreased veer to R with gait.  Pt instructed patient in gait over uneven surface for 10ft15fh RW and supervision A with min cues for AD management.   Gait speed measured: 0.2m/s63mh RW.   Patient returned to room and left sitting in recliner with call ball in reach.   PT Discharge Precautions/RestrictionsPrecautions Precautions: Fall Precaution Comments: Due to dizziness Restrictions Weight Bearing Restrictions: No Vital Signs Therapy Vitals Temp: 98.3 F (36.8 C) Temp Source: Oral Pulse Rate:  73 Resp: 16 BP: 121/70 Patient Position (if appropriate): Sitting Oxygen Therapy SpO2: 100 % O2 Device: Not Delivered Pain Pain Assessment Pain Assessment: No/denies pain Pain Score: 0-No pain Vision/Perception  Vision - Assessment Additional Comments: Pt with no c/o visual disturbances or changes.   Cognition Overall Cognitive Status: Within Functional Limits for tasks assessed Arousal/Alertness: Awake/alert Orientation Level: Oriented X4 Attention: Alternating Selective Attention: Appears intact Alternating Attention: Appears intact Memory: Appears intact Awareness: Appears intact Problem Solving: Appears intact Executive Function: Reasoning Reasoning: Appears intact Organizing: Impaired Organizing Impairment: Verbal complex Self Correcting: Impaired Self Correcting Impairment: Verbal complex Safety/Judgment: Appears intact Sensation Sensation Light Touch: Appears Intact Hot/Cold: Appears Intact Proprioception: Appears Intact Coordination Gross Motor Movements are Fluid and Coordinated: Yes Fine Motor Movements are Fluid and Coordinated: Yes Finger Nose Finger Test: [facility]  [facility]  Motor Motor: Within Functional Limits Motor - Skilled Clinical Observations: Generalized strength deficit  Mobility Bed Mobility Bed Mobility: Rolling Right;Rolling Left;Supine to Sit;Sit to Supine Rolling Right: 6: Modified independent (Device/Increase time) Rolling Left: 6: Modified independent (Device/Increase time) Supine to Sit: 6: Modified independent (Device/Increase time) Sit to Supine: 6: Modified independent (Device/Increase time) Transfers Transfers: Yes Sit to Stand: 6: Modified independent (Device/Increase time) Stand Pivot Transfers: 5: Supervision Stand Pivot Transfer Details: Verbal cues for precautions/safety Locomotion  Ambulation Ambulation/Gait Assistance: 5: Supervision Ambulation Distance (Feet): 160 Feet Assistive device: Rolling walker Ambulation/Gait  Assistance Details: Verbal cues for precautions/safety Gait Gait: Yes Gait Pattern: Wide base of support Stairs / Additional Locomotion Stairs: Yes Stairs Assistance: 5: Supervision Stairs Assistance Details: Verbal cues for technique;Verbal cues for precautions/safety Stair Management Technique: Two rails Number of Stairs: 12 Height of Stairs: 6 Ramp: 5: Supervision  Curb: 5: Psychiatric nurse: No  Trunk/Postural Assessment  Cervical Assessment Cervical Assessment: Within Functional Limits Thoracic Assessment Thoracic Assessment: Within Functional Limits Lumbar Assessment Lumbar Assessment: Within Functional Limits Postural Control Postural Control: Within Functional Limits  Balance Static Sitting Balance Static Sitting - Level of Assistance: 6: Modified independent (Device/Increase time) Dynamic Sitting Balance Dynamic Sitting - Level of Assistance: 6: Modified independent (Device/Increase time) Static Standing Balance Static Standing - Level of Assistance: 6: Modified independent (Device/Increase time) Dynamic Standing Balance Dynamic Standing - Level of Assistance: 6: Modified independent (Device/Increase time) Extremity Assessment  RUE Assessment RUE Assessment: Within Functional Limits LUE Assessment LUE Assessment: Within Functional Limits RLE Assessment RLE Assessment: Within Functional Limits LLE Assessment LLE Assessment: Within Functional Limits   See Function Navigator for Current Functional Status.  Lorie Phenix 10/20/2015, 3:31 PM

## 2015-10-20 NOTE — Progress Notes (Signed)
Occupational Therapy Discharge Summary  Patient Details  Name: [name] MRN: [id number] Date of Birth: 22-Jun-1960  Today's Date: 10/20/2015   Patient has met 10 of 10 long term goals due to improved activity tolerance, improved balance, ability to compensate for deficits and improved awareness.  Patient to discharge at overall Modified Independent level, supervision with mobility due to intermittent dizziness. Patient's care partner is independent to provide the necessary physical assistance at discharge.    Recommendation:  Patient will benefit from ongoing skilled OT services in home health setting to continue to advance functional skills in the area of BADL and iADL.  Equipment: Tub shower bench.   Reasons for discharge: treatment goals met and discharge from hospital  Patient/family agrees with progress made and goals achieved: Yes  OT Discharge Precautions/Restrictions Precautions Precautions: Fall Precaution Comments: Due to dizziness Restrictions Weight Bearing Restrictions: No General   Vital Signs Therapy Vitals Temp: 98.3 F (36.8 C) Temp Source: Oral Pulse Rate: 73 Resp: 16 BP: 121/70 Patient Position (if appropriate): Sitting Oxygen Therapy SpO2: 100 % O2 Device: Not Delivered Pain Pain Assessment Pain Assessment: No/denies pain Pain Score: 0-No pain ADL   Vision/Perception  Vision- History Baseline Vision/History: Wears glasses Wears Glasses: Distance only Patient Visual Report: Blurring of vision (When dizzy. No change from baseline otherwise.) Vision- Assessment Vision Assessment?: No apparent visual deficits Additional Comments: Pt with no c/o visual disturbances or changes.   Cognition Overall Cognitive Status: Within Functional Limits for tasks assessed Arousal/Alertness: Awake/alert Orientation Level: Oriented X4 Attention: Alternating Selective Attention: Appears intact Alternating Attention: Appears intact Memory: Appears  intact Awareness: Appears intact Problem Solving: Appears intact Executive Function: Reasoning Reasoning: Appears intact Organizing: Impaired Organizing Impairment: Verbal complex Self Correcting: Impaired Self Correcting Impairment: Verbal complex Safety/Judgment: Appears intact Sensation Sensation Light Touch: Appears Intact Hot/Cold: Appears Intact Proprioception: Appears Intact Coordination Gross Motor Movements are Fluid and Coordinated: Yes Fine Motor Movements are Fluid and Coordinated: Yes Finger Nose Finger Test: [facility]  Motor  Motor Motor: Within Functional Limits Motor - Skilled Clinical Observations: Generalized strength deficit Mobility  Bed Mobility Bed Mobility: Rolling Right;Rolling Left;Supine to Sit;Sit to Supine Rolling Right: 6: Modified independent (Device/Increase time) Rolling Left: 6: Modified independent (Device/Increase time) Supine to Sit: 6: Modified independent (Device/Increase time) Sit to Supine: 6: Modified independent (Device/Increase time) Transfers Sit to Stand: 6: Modified independent (Device/Increase time)  Trunk/Postural Assessment  Cervical Assessment Cervical Assessment: Within Functional Limits Thoracic Assessment Thoracic Assessment: Within Functional Limits Lumbar Assessment Lumbar Assessment: Within Functional Limits Postural Control Postural Control: Within Functional Limits  Balance Static Sitting Balance Static Sitting - Level of Assistance: 6: Modified independent (Device/Increase time) Dynamic Sitting Balance Dynamic Sitting - Level of Assistance: 6: Modified independent (Device/Increase time) Static Standing Balance Static Standing - Level of Assistance: 6: Modified independent (Device/Increase time) Dynamic Standing Balance Dynamic Standing - Level of Assistance: 6: Modified independent (Device/Increase time) Extremity/Trunk Assessment RUE Assessment RUE Assessment: Within Functional Limits LUE Assessment LUE  Assessment: Within Functional Limits   See Function Navigator for Current Functional Status.  Dierdre Searles 10/20/2015, 3:10 PM

## 2015-10-21 ENCOUNTER — Inpatient Hospital Stay (HOSPITAL_COMMUNITY): Payer: Self-pay | Admitting: Occupational Therapy

## 2015-10-21 NOTE — Progress Notes (Signed)
[place] PHYSICAL MEDICINE & REHABILITATION     PROGRESS NOTE  Subjective/Complaints:  Had questions about medications. Going home today.    ROS:  Denies CP, SOB, N/V/D.  Objective: Vital Signs: Blood pressure 139/69, pulse (!) 104, temperature 98.1 F (36.7 C), resp. rate 18, height _0  (1.6 m), weight 116.6 kg (257 lb 0.9 oz), last menstrual period 02/17/2011, SpO2 100 %. No results found.  Recent Labs  10/20/15 0647  WBC 7.1  HGB 11.7*  HCT 39.0  PLT 362    Recent Labs  10/19/15 0502 10/20/15 0647  NA  --  139  K  --  3.6  CL  --  100*  GLUCOSE  --  101*  BUN  --  22*  CREATININE 1.01* 1.02*  CALCIUM  --  9.6   CBG (last 3)   Recent Labs  10/19/15 0643 10/20/15 0631  GLUCAP 109* 110*    Wt Readings from Last 3 Encounters:  10/20/15 116.6 kg (257 lb 0.9 oz)  10/06/15 120.8 kg (266 lb 5.1 oz)    Physical Exam:  BP 139/69   Pulse (!) 104   Temp 98.1 F (36.7 C)   Resp 18   Ht _1  (1.6 m)   Wt 116.6 kg (257 lb 0.9 oz)   LMP 02/17/2011   SpO2 100%   BMI 45.54 kg/m  Constitutional: She appears well-developed and well-nourished. Morbidly obese.  HENT: Normocephalic and atraumatic.  Mouth/Throat: Oropharynx is clear and moist.  Eyes: Conjunctivae and EOM are normal. Cardiovascular: Normal rate and regular rhythm.Murmur heard. Respiratory: Effort normal and breath sounds normal. No stridor. No respiratory distress.  GI: Soft. Bowel sounds are normal. She exhibits no distension. There is no tenderness.  Musculoskeletal: She exhibits no edema or tenderness.  Neurological: She is alert and oriented.  Motor strength is 4+/5, bilateral deltoid, biceps, triceps, hip flexor,  5/5 bilateral hand grip,knee extensors, ankle dorsi/plantar flexors.  NEURO EXAM STABLE Skin: Skin is warm and dry.  Psychiatric: She has a normal mood and affect. Her behavior is normal. Thought content normal.   Assessment/Plan: 1. 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 Physiatrist is providing close team supervision and 24 hour management of active medical problems listed below. Physiatrist and rehab team continue to assess barriers to discharge/monitor patient progress toward functional and medical goals.  Function:  Bathing Bathing position Bathing activity did not occur: N/A (unable due to lethargy) Position: Wheelchair/chair at sink  Bathing parts Body parts bathed by patient: Right arm, Left arm, Chest, Abdomen, Front perineal area, Buttocks, Right upper leg, Left upper leg, Right lower leg, Left lower leg Body parts bathed by helper: Back  Bathing assist Assist Level: More than reasonable time   Set up : To obtain items  Upper Body Dressing/Undressing Upper body dressing Upper body dressing/undressing activity did not occur: N/A (unable due to lethargy) What is the patient wearing?: Bra, Pull over shirt/dress Bra - Perfomed by patient: Thread/unthread right bra strap, Thread/unthread left bra strap, Hook/unhook bra (pull down sports bra)   Pull over shirt/dress - Perfomed by patient: Thread/unthread right sleeve, Thread/unthread left sleeve, Put head through opening, Pull shirt over trunk          Upper body assist Assist Level: No help, No cues      Lower Body Dressing/Undressing Lower body dressing Lower body dressing/undressing activity did  not occur: N/A (unable due to lethargy) What is the patient wearing?: Underwear, Pants, Non-skid slipper socks Underwear - Performed by patient: Thread/unthread right underwear leg, Thread/unthread left underwear leg, Pull underwear up/down   Pants- Performed by patient: Thread/unthread right pants leg, Thread/unthread left pants leg, Pull pants up/down   Non-skid slipper socks- Performed by patient: Don/doff right sock, Don/doff left  sock     Socks - Performed by helper: Don/doff right sock, Don/doff left sock Shoes - Performed by patient: Don/doff right shoe, Don/doff left shoe            Lower body assist Assist for lower body dressing: More than reasonable time   Set up : To obtain clothing/put away  Toileting Toileting   Toileting steps completed by patient: Adjust clothing prior to toileting, Performs perineal hygiene, Adjust clothing after toileting Toileting steps completed by helper: Adjust clothing after toileting Toileting Assistive Devices: Grab bar or rail  Toileting assist Assist level: Supervision or verbal cues   Transfers Chair/bed transfer   Chair/bed transfer method: Stand pivot Chair/bed transfer assist level: No Help, no cues, assistive device, takes more than a reasonable amount of time Chair/bed transfer assistive device: Medical sales representative     Max distance: 165 Assist level: Supervision or verbal cues   Wheelchair Wheelchair activity did not occur: N/A Type: Manual Max wheelchair distance: 145f Assist Level: Supervision or verbal cues  Cognition Comprehension Comprehension assist level: Follows basic conversation/direction with extra time/assistive device  Expression Expression assist level: Expresses complex ideas: With no assist  Social Interaction Social Interaction assist level: Interacts appropriately with others - No medications needed.  Problem Solving Problem solving assist level: Solves complex problems: With extra time  Memory Memory assist level: Recognizes or recalls 90% of the time/requires cueing < 10% of the time    Medical Problem List and Plan: 1. Gait disorder secondary to right temporal intracranial hemorrhage, complicated by morbid obesity  -home today. Goals met 2. DVT Prophylaxis/Anticoagulation: Pharmaceutical: Lovenox 3. Pain Management: tylenol prn for knee pain.  4. Mood: LCSW to follow for evaluation and support.  5.  Neuropsych: This patient is capable of making decisions on her own behalf.  She notes depression, but refuses medication at present.  She appears to be doing better at present.   6. Skin/Wound Care: Routine pressure relief measures 7. Fluids/Electrolytes/Nutrition: Monitor I/O.   Mild hypokalemia: Resolved on 7/28, cont to supplement 8. HTN: Monitor bid. Continue catapres, hygroton, and metoprolol 9. Iron deficiency anemia: Added iron supplement.   Hb 11.7 on 7/28. 10. New onset seizures: Cont Keppra bid.  11. Diarrhea: discontinued laxatives.  12. Prediabetes/Morbid obesity: Encourage weight loss. Will consult dietician to help educate on diet.   CBGs fairly well controlled  -reviewed diet and general health issues with patient today 13. Dizziness:Check orthostatic vitals.  14. Supplemental Oxygen dependent: Will wean as tolerated 15. Urinary incont- check UA 16.Hematochezia- Resolved  Likely hemmorhoid 17. Super Obese  Body mass index is 45.54 kg/m.  Diet and exercise education  Will cont to encourage weight loss to increase endurance and promote overall health 18. Urinary incontinence   Pt refusing medications at present  Educated on night time fluid restriction and timed voiding    LOS (Days) 9 A FACE TO FACE EVALUATION WAS PERFORMED  [name] T 10/21/2015 7:48 AM

## 2015-10-21 NOTE — Progress Notes (Signed)
10/21/15 1025 nursing Patient discharged to home per wheelchair accompanied by family member and NT. Discharge instructions printed and discussed with patient by Adonis Huguenin RN assisted by Glenis Smoker RN. Questions answered by RN no further questions.

## 2015-10-23 ENCOUNTER — Encounter: Payer: Self-pay | Admitting: Physician Assistant

## 2015-10-23 ENCOUNTER — Ambulatory Visit: Payer: Self-pay | Attending: Internal Medicine | Admitting: Physician Assistant

## 2015-10-23 VITALS — BP 129/79 | HR 66 | Temp 98.4°F | Resp 16 | Ht 59.0 in | Wt 257.0 lb

## 2015-10-23 DIAGNOSIS — I619 Nontraumatic intracerebral hemorrhage, unspecified: Secondary | ICD-10-CM

## 2015-10-23 NOTE — Patient Instructions (Signed)
Work hard on your therapy Make the necessary diet changes Take all meds as prescribed Get an appt with Dr. Erlinda Hong for 4 weeks

## 2015-10-23 NOTE — Progress Notes (Signed)
Social Work  Discharge Note  The overall goal for the admission was met for:   Discharge location: Yes - home with family to provide 24/7 assistance  Length of Stay: Yes - 9 days  Discharge activity level: Yes - supervision  Home/community participation: Yes  Services provided included: MD, RD, PT, OT, SLP, RN, TR, Pharmacy, Neuropsych and SW  Financial Services: Medicaid and SSD apps pending  Follow-up services arranged: Home Health: PT, OT, ST via [place], DME: rolling walker, tub bench via [place], Other: referred to [facility] medication assistance and to [place] and Patient/Family has no preference for HH/DME agencies  Comments (or additional information):  Patient/Family verbalized understanding of follow-up arrangements: Yes  Individual responsible for coordination of the follow-up plan: pt  Confirmed correct DME delivered: Latravion Graves 10/21/2015    Keiasha Diep

## 2015-10-23 NOTE — Patient Care Conference (Signed)
Inpatient RehabilitationTeam Conference and Plan of Care Update Date: 10/18/2015   Time: 2:00 PM    Patient Name: [name]      Medical Record Number: [id number]  Date of Birth: 1960-06-02 Sex: Female         Room/Bed: 4M12C/4M12C-01 Payor Info: Payor: MEDICAID POTENTIAL / Plan: MEDICAID POTENTIAL / Product Type: *No Product type* /    Admitting Diagnosis: ICH  Admit Date/Time:  10/12/2015  8:05 PM Admission Comments: No comment available   Primary Diagnosis:  ICH (intracerebral hemorrhage) ([place]) Principal Problem: ICH (intracerebral hemorrhage) ([place])  Patient Active Problem List   Diagnosis Date Noted  . Adjustment disorder with depressed mood 10/19/2015  . Nocturnal enuresis   . Hypokalemia   . Anemia, iron deficiency   . Super obese ([place])   . Prediabetes   . Orthostatic hypotension   . Supplemental oxygen dependent   . Diastolic dysfunction   . Tachypnea   . AKI (acute kidney injury) ([place])   . Acute blood loss anemia   . Lethargy   . Wheezing   . Benign essential HTN   . ICH (intracerebral hemorrhage) ([place]) 10/06/2015    Expected Discharge Date: Expected Discharge Date: 10/21/15  Team Members Present: Physician leading conference: Dr. Delice Lesch Social Worker Present: Ovidio Kin, LCSW Nurse Present: Heather Roberts, RN PT Present: Lavone Nian, PT OT Present: Roanna Epley, COTA SLP Present: Gunnar Fusi, SLP PPS Coordinator present : Daiva Nakayama, RN, CRRN     Current Status/Progress Goal Weekly Team Focus  Medical   Gait disorder secondary to right temporal intracranial hemmorrhage, complicated by morbid obesity  Improve mobility, safety, depression  see above   Bowel/Bladder   Continent of b/b.  LBM 10/17/15.  Bladder urgency.  Patient to be continent of Bowel/Bladder while in [facility]  Assess b/b function and timed toilet patient q 2 hrs.   Swallow/Nutrition/ Hydration             ADL's   Supervision/steady A overall; fatigues quickly; dizziness with  activity; cognitive deficits??  mod I  activity tolerance, safety awareness, BADL retraining, pt education   Mobility   supervision for ambulation with RW, supervision for stair negotiation, supervision for standing balance  mod I standing balance, Mod I bed mobility, supervision car transfers, supervision for ambulation & stair negotiation  pt education, standing balance, gait training   Communication             Safety/Cognition/ Behavioral Observations            Pain   Denied any pain tonight but sometimes complains of headache.  <3   Assess and treat pain q shift and as needed   Skin   Minor bruises  Skin to be free of new skin breakdown/infection while in [facility]  Assess skin q shift and prn    Rehab Goals Patient on target to meet rehab goals: Yes *See Care Plan and progress notes for long and short-term goals.  Barriers to Discharge: Hypokalemia, ABLA, Prediabetes, Urinary incontinence, Depression    Possible Resolutions to Barriers:  Monitor labs, pt refusing meds - psychological support, timed voiding    Discharge Planning/Teaching Needs:  Plan home with husband and family who can provide 24/7 assistance.  Teaching has been ongoing.   Team Discussion:  Pt making good gains and on target to meet supervision goals.  No concerns.  Revisions to Treatment Plan:  None   Continued Need for Acute Rehabilitation Level of Care: The patient requires  daily medical management by a physician with specialized training in physical medicine and rehabilitation for the following conditions: Daily direction of a multidisciplinary physical rehabilitation program to ensure safe treatment while eliciting the highest outcome that is of practical value to the patient.: Yes Daily medical management of patient stability for increased activity during participation in an intensive rehabilitation regime.: Yes Daily analysis of laboratory values and/or radiology reports with any subsequent need for  medication adjustment of medical intervention for : Neurological problems;Urological problems;Mood/behavior problems  Pamlea Finder 10/18/2015, 3:52 PM

## 2015-10-23 NOTE — Progress Notes (Signed)
[name]  [id number]  [id number]  DOB - [birth year]  Chief Complaint  Patient presents with  . Hospitalization Follow-up       Subjective:   [name] is a 55 y.o. female here today for establishment of care. She presented to the emergency department on 10/06/2015 with decreased level of consciousness, confusion, and possibly a seizure. She was treated in the emergency room with Keppra and Cleviprex. She was admitted. A CT scan of her head showed an acute right posterior parietal intracranial hemorrhage an MRI showed chronic small micro hemorrhages. She was seen by neurology. Her hospital course was complicated by positive troponin but cleared by cardiology. She also has had an adjustment disorder with depressed mood.They felt that this is all attributed to her stroke. She was sent to rehabilitation on 10/12/2015 and discharged on 10/20/2015. She had ongoing physical, speech and occupational therapies.  Since discharge she's been trying to do better with her diet. She has been compliant with her medications. Her blood pressure has been great. She is struggling with depression.   ROS: GEN: denies fever or chills, denies change in weight Skin: denies lesions or rashes HEENT: denies headache, earache, epistaxis, sore throat, or neck pain LUNGS: denies SHOB, dyspnea, PND, orthopnea CV: denies CP or palpitations ABD: denies abd pain, N or V EXT: denies muscle spasms or swelling; no pain in lower ext, no weakness NEURO: denies numbness or tingling, denies sz, +stroke or TIA   ALLERGIES: No Known Allergies  PAST MEDICAL HISTORY: Past Medical History:  Diagnosis Date  . Hypertension   . Iron deficiency anemia     PAST SURGICAL HISTORY: Past Surgical History:  Procedure Laterality Date  . CARDIAC CATHETERIZATION  04/2010   Dr. Terrence Dupont    MEDICATIONS AT HOME: Prior to Admission medications   Medication Sig Start Date End Date Taking? Authorizing Provider    amLODipine (NORVASC) 10 MG tablet Take 1 tablet (10 mg total) by mouth daily. 10/20/15  Yes Ivan Anchors Love, PA-C  chlorthalidone (HYGROTON) 25 MG tablet Take 1 tablet (25 mg total) by mouth daily. 10/20/15  Yes Ivan Anchors Love, PA-C  fluticasone (FLONASE) 50 MCG/ACT nasal spray Place 1 spray into both nostrils daily as needed for allergies or rhinitis.   Yes Historical Provider, MD  iron polysaccharides (NIFEREX) 150 MG capsule Take 1 capsule (150 mg total) by mouth 2 times daily at 12 noon and 4 pm. 10/20/15  Yes Ivan Anchors Love, PA-C  levETIRAcetam (KEPPRA) 750 MG tablet Take 1 tablet (750 mg total) by mouth 2 (two) times daily. 10/20/15  Yes Ivan Anchors Love, PA-C  metoprolol (LOPRESSOR) 50 MG tablet Take 1 tablet (50 mg total) by mouth 2 (two) times daily. 10/20/15  Yes Ivan Anchors Love, PA-C  potassium chloride SA (K-DUR,KLOR-CON) 20 MEQ tablet Take 1 tablet (20 mEq total) by mouth daily. 10/20/15  Yes Ivan Anchors Love, PA-C  hydroxypropyl methylcellulose / hypromellose (ISOPTO TEARS / GONIOVISC) 2.5 % ophthalmic solution Place 2 drops into both eyes as needed (irritation). Patient not taking: Reported on 10/23/2015 10/20/15   Ivan Anchors Love, PA-C     Objective:   Vitals:   10/23/15 1030  BP: 129/79  Pulse: 66  Resp: 16  Temp: 98.4 F (36.9 C)  TempSrc: Oral  SpO2: 96%  Weight: 257 lb (116.6 kg)  Height: 4\' 11"  (1.499 m)    Exam General appearance : Awake, alert, not in any distress. Speech Clear. Not toxic looking HEENT: Atraumatic and Normocephalic, pupils  equally reactive to light and accomodation Neck: supple, no JVD. No cervical lymphadenopathy.  Chest:Good air entry bilaterally, no added sounds  CVS: S1 S2 regular, no murmurs.  Abdomen: Bowel sounds present, Non tender and not distended with no gaurding, rigidity or rebound. Extremities: B/L Lower Ext shows no edema, both legs are warm to touch Neurology: Awake alert, and oriented X 3, CN II-XII intact, grossly normal and Non focal Skin:No  Rash Wounds:N/A  Data Review Lab Results  Component Value Date   HGBA1C 5.7 (H) 10/07/2015     Assessment & Plan  1. S/p recent [place]  -Cont Home PT  -Dietary changes and weight loss encouraged  -RF modification  -Keep appt with neuro in 4 weeks  -no driving or working until cleared by Neuro  -Neuro to advise when repeat imaging needed 2. HTN  -cont current meds  -DASH diet 3. Pre DM  -discussed weight loss and dietary restrictions   4. Adjustment D/O with depressed mood  -patience, add SSRI if needed in future  Return in about 2 weeks (around 11/06/2015).  The patient was given clear instructions to go to ER or return to medical center if symptoms don't improve, worsen or new problems develop. The patient verbalized understanding. The patient was told to call to get lab results if they haven't heard anything in the next week.   This note has been created with Surveyor, quantity. Any transcriptional errors are unintentional.    Zettie Pho, PA-C [facility] and [facility] [place], [place]   10/23/2015, 11:29 AM

## 2015-10-23 NOTE — Progress Notes (Signed)
Pt here for hospital F/U for stroke. Pt reports pain in both arms and shoulders rated at a 7. Pt has taken medications today.

## 2015-10-24 ENCOUNTER — Telehealth: Payer: Self-pay

## 2015-10-24 NOTE — Telephone Encounter (Signed)
1. Are you/is patient experiencing any problems since coming home? Are there any questions regarding any aspect of care? No issues.  2. Are there any questions regarding medications administration/dosing? Are meds being taken as prescribed? Patient should review meds with caller to confirm. Meds confirmed.  3. Have there been any falls? No falls.  4. Has Home Health been to the house and/or have they contacted you? If not, have you tried to contact them? Can we help you contact them? Home health appointment pending.  5. Are bowels and bladder emptying properly? Are there any unexpected incontinence issues? If applicable, is patient following bowel/bladder programs? No issues with bowels. Frequent urinating at night.  6. Any fevers, problems with breathing, unexpected pain? No issues.  7. Are there any skin problems or new areas of breakdown? No issues.  8. Has the patient/family member arranged specialty MD follow up (ie cardiology/neurology/renal/surgical/etc)?  Can we help arrange? Has made appointment with Dr. Erlinda Hong. Already has seen PCP.  9. Does the patient need any other services or support that we can help arrange? Denies services. 10. Are caregivers following through as expected in assisting the patient? Husband is assisting pt.  11. Has the patient quit smoking, drinking alcohol, or using drugs as recommended? Pt is not smoking, drinking alcohol, or using drugs.   Spoke with pt. She is aware of appointment on 11/02/15 @ 11:40 am. Pt packet will be mailed.

## 2015-11-02 ENCOUNTER — Encounter: Payer: Medicaid Other | Attending: Physical Medicine & Rehabilitation | Admitting: Physical Medicine & Rehabilitation

## 2015-11-02 ENCOUNTER — Encounter: Payer: Self-pay | Admitting: Physical Medicine & Rehabilitation

## 2015-11-02 VITALS — BP 101/70 | HR 69 | Resp 17

## 2015-11-02 DIAGNOSIS — I629 Nontraumatic intracranial hemorrhage, unspecified: Secondary | ICD-10-CM | POA: Diagnosis present

## 2015-11-02 DIAGNOSIS — R7303 Prediabetes: Secondary | ICD-10-CM | POA: Diagnosis not present

## 2015-11-02 DIAGNOSIS — R269 Unspecified abnormalities of gait and mobility: Secondary | ICD-10-CM | POA: Diagnosis not present

## 2015-11-02 DIAGNOSIS — I69249 Monoplegia of lower limb following other nontraumatic intracranial hemorrhage affecting unspecified side: Secondary | ICD-10-CM | POA: Insufficient documentation

## 2015-11-02 DIAGNOSIS — Z5189 Encounter for other specified aftercare: Secondary | ICD-10-CM | POA: Insufficient documentation

## 2015-11-02 DIAGNOSIS — R569 Unspecified convulsions: Secondary | ICD-10-CM | POA: Diagnosis not present

## 2015-11-02 DIAGNOSIS — I611 Nontraumatic intracerebral hemorrhage in hemisphere, cortical: Secondary | ICD-10-CM

## 2015-11-02 DIAGNOSIS — M62838 Other muscle spasm: Secondary | ICD-10-CM

## 2015-11-02 DIAGNOSIS — I69398 Other sequelae of cerebral infarction: Secondary | ICD-10-CM

## 2015-11-02 DIAGNOSIS — F32A Depression, unspecified: Secondary | ICD-10-CM

## 2015-11-02 DIAGNOSIS — F329 Major depressive disorder, single episode, unspecified: Secondary | ICD-10-CM | POA: Diagnosis not present

## 2015-11-02 DIAGNOSIS — I10 Essential (primary) hypertension: Secondary | ICD-10-CM | POA: Diagnosis not present

## 2015-11-02 NOTE — Progress Notes (Signed)
Subjective:    Patient ID: [name], female    DOB: 12-04-1960, 55 y.o.   MRN: [id number]  HPI  55 year old RH-female with history of morbid obesity, HTN, iron deficiency anemia who presents for transitional care management after being discharged from CIR after right temporal intracranial hemorrhage. Admit date: 10/12/2015 Discharge date: 10/20/2015 At discharge, she was instructed to abstain from driving, which she has been complaint.  She states she is drinking fluids.  She went to her PCP.  She has scheduled an appointment with Neurology. In the hospital she had some depression, which she refused medication for, but that has improved.  Her BP has been "great".  She denies seizures. Denies SOB.  She denies incontinence, which was an issue in the hospital. She complains of muscle spasms.  DME: Shower chair, reacher Therapies: 2/week Mobility: Walker at all times  Pain Inventory Average Pain 10 Pain Right Now 4 My pain is stabbing  In the last 24 hours, has pain interfered with the following? General activity 4 Relation with others NA Enjoyment of life NA What TIME of day is your pain at its worst? morning Sleep (in general) Fair  Pain is worse with: inactivity and some activites Pain improves with: heat/ice Relief from Meds: NA  Mobility walk without assistance ability to climb steps?  yes do you drive?  yes Do you have any goals in this area?  yes  Function Do you have any goals in this area?  no  Neuro/Psych bladder control problems weakness numbness tremor tingling spasms depression anxiety  Prior Studies Any changes since last visit?  no  Physicians involved in your care Primary care Dr. Janne Napoleon   Family History  Problem Relation Age of Onset  . Diabetes Father   . Hypertension Father   . Hypertension Sister    Social History   Social History  . Marital status: Married    Spouse name: N/A  . Number of children: N/A  . Years of  education: N/A   Social History Main Topics  . Smoking status: Never Smoker  . Smokeless tobacco: Never Used  . Alcohol use No  . Drug use: No  . Sexual activity: Not Asked   Other Topics Concern  . None   Social History Narrative  . None   Past Surgical History:  Procedure Laterality Date  . CARDIAC CATHETERIZATION  04/2010   Dr. Terrence Dupont   Past Medical History:  Diagnosis Date  . Hypertension   . Iron deficiency anemia    BP 101/70   Pulse 69   Resp 17   LMP 02/17/2011   SpO2 93%   Opioid Risk Score:   Fall Risk Score:  `1  Depression screen PHQ 2/9  Depression screen [facility] 2/9 11/02/2015 10/23/2015  Decreased Interest 3 3  Down, Depressed, Hopeless 1 3  PHQ - 2 Score 4 6  Altered sleeping 3 3  Tired, decreased energy 3 3  Change in appetite 1 3  Feeling bad or failure about yourself  0 3  Trouble concentrating 3 3  Moving slowly or fidgety/restless 3 3  Suicidal thoughts 0 3  PHQ-9 Score 17 27  Difficult doing work/chores Extremely dIfficult -    Review of Systems  Constitutional:       Bladder control problems   Neurological: Positive for tremors, weakness and numbness.       Tingling Spasms    Psychiatric/Behavioral: Positive for dysphoric mood. The patient is nervous/anxious.  All other systems reviewed and are negative.      Objective:   Physical Exam Constitutional: She appears well-developed and well-nourished. Morbidly obese.  HENT: Normocephalic and atraumatic.  Eyes: Conjunctivae and EOM are normal. Cardiovascular: Normal rate and regular rhythm.Murmur heard. Respiratory: Effort normal and breath sounds normal. No stridor. No respiratory distress.  GI: Soft. Bowel sounds are normal. She exhibits no distension. There is no tenderness.  Musculoskeletal: She exhibits no edema or tenderness.  Neurological: She is alert and oriented.  Motor: 4+/5 proximally, 5/5 distally Skin: Skin is warm and dry. Callous b/l knees Psychiatric: She has  a normal mood and affect. Her behavior is normal. Thought content normal.     Assessment & Plan:  55 year old RH-female with history of morbid obesity, HTN, iron deficiency anemia who presents for transitional care management after being discharged from CIR after right temporal intracranial hemorrhage.  1.  S/p right temporal intracranial hemorrhage  Cont therapies  Cont to follow with Neurology  Cont meds   2. Depression   Pt states she is managing with her chaplain and scriptures  Does not want medications at present and states that it is improving  3. HTN  Cont meds  4. New onset seizures  Cont meds  5. Prediabetes  Cont diet and lifestyle modification  6. Morbid obesity  Cont to encourage weight loss  7. Dizziness  Resolved  8. Incontinence  Per pt, resolved  9. Muscle spasms  Pt refusing medications  Encouraged ROM and stretching  10. Abnormality of gait - post stroke  Cont walker for safety  Cont therapies  Referrals reviewed Meds reviewed All questions answered

## 2015-11-07 ENCOUNTER — Ambulatory Visit: Payer: Medicaid Other | Attending: Internal Medicine | Admitting: Internal Medicine

## 2015-11-07 ENCOUNTER — Encounter: Payer: Self-pay | Admitting: Internal Medicine

## 2015-11-07 DIAGNOSIS — Z79899 Other long term (current) drug therapy: Secondary | ICD-10-CM | POA: Insufficient documentation

## 2015-11-07 DIAGNOSIS — R569 Unspecified convulsions: Secondary | ICD-10-CM

## 2015-11-07 DIAGNOSIS — M25511 Pain in right shoulder: Secondary | ICD-10-CM | POA: Insufficient documentation

## 2015-11-07 DIAGNOSIS — F4321 Adjustment disorder with depressed mood: Secondary | ICD-10-CM | POA: Insufficient documentation

## 2015-11-07 DIAGNOSIS — E669 Obesity, unspecified: Secondary | ICD-10-CM

## 2015-11-07 DIAGNOSIS — I10 Essential (primary) hypertension: Secondary | ICD-10-CM | POA: Diagnosis not present

## 2015-11-07 DIAGNOSIS — R7303 Prediabetes: Secondary | ICD-10-CM | POA: Diagnosis not present

## 2015-11-07 DIAGNOSIS — D509 Iron deficiency anemia, unspecified: Secondary | ICD-10-CM

## 2015-11-07 DIAGNOSIS — R400 Somnolence: Secondary | ICD-10-CM | POA: Insufficient documentation

## 2015-11-07 DIAGNOSIS — R197 Diarrhea, unspecified: Secondary | ICD-10-CM | POA: Insufficient documentation

## 2015-11-07 DIAGNOSIS — G4710 Hypersomnia, unspecified: Secondary | ICD-10-CM

## 2015-11-07 DIAGNOSIS — I611 Nontraumatic intracerebral hemorrhage in hemisphere, cortical: Secondary | ICD-10-CM

## 2015-11-07 DIAGNOSIS — R0683 Snoring: Secondary | ICD-10-CM | POA: Diagnosis not present

## 2015-11-07 MED ORDER — METFORMIN HCL ER 500 MG PO TB24: 500.0000 mg | ORAL_TABLET | Freq: Every day | ORAL | 3 refills | Status: DC

## 2015-11-07 NOTE — Progress Notes (Signed)
Pt is in the office today for establish care Pt is not in any pain today

## 2015-11-07 NOTE — Patient Instructions (Addendum)
- financial aid packet.   Diabetes Mellitus and Food It is important for you to manage your blood sugar (glucose) level. Your blood glucose level can be greatly affected by what you eat. Eating healthier foods in the appropriate amounts throughout the day at about the same time each day will help you control your blood glucose level. It can also help slow or prevent worsening of your diabetes mellitus. Healthy eating may even help you improve the level of your blood pressure and reach or maintain a healthy weight.  General recommendations for healthful eating and cooking habits include:  Eating meals and snacks regularly. Avoid going long periods of time without eating to lose weight.  Eating a diet that consists mainly of plant-based foods, such as fruits, vegetables, nuts, legumes, and whole grains.  Using low-heat cooking methods, such as baking, instead of high-heat cooking methods, such as deep frying. Work with your dietitian to make sure you understand how to use the Nutrition Facts information on food labels. HOW CAN FOOD AFFECT ME? Carbohydrates Carbohydrates affect your blood glucose level more than any other type of food. Your dietitian will help you determine how many carbohydrates to eat at each meal and teach you how to count carbohydrates. Counting carbohydrates is important to keep your blood glucose at a healthy level, especially if you are using insulin or taking certain medicines for diabetes mellitus. Alcohol Alcohol can cause sudden decreases in blood glucose (hypoglycemia), especially if you use insulin or take certain medicines for diabetes mellitus. Hypoglycemia can be a life-threatening condition. Symptoms of hypoglycemia (sleepiness, dizziness, and disorientation) are similar to symptoms of having too much alcohol.  If your health care provider has given you approval to drink alcohol, do so in moderation and use the following guidelines:  Women should not have more than  one drink per day, and men should not have more than two drinks per day. One drink is equal to:  12 oz of beer.  5 oz of wine.  1 oz of hard liquor.  Do not drink on an empty stomach.  Keep yourself hydrated. Have water, diet soda, or unsweetened iced tea.  Regular soda, juice, and other mixers might contain a lot of carbohydrates and should be counted. WHAT FOODS ARE NOT RECOMMENDED? As you make food choices, it is important to remember that all foods are not the same. Some foods have fewer nutrients per serving than other foods, even though they might have the same number of calories or carbohydrates. It is difficult to get your body what it needs when you eat foods with fewer nutrients. Examples of foods that you should avoid that are high in calories and carbohydrates but low in nutrients include:  Trans fats (most processed foods list trans fats on the Nutrition Facts label).  Regular soda.  Juice.  Candy.  Sweets, such as cake, pie, doughnuts, and cookies.  Fried foods. WHAT FOODS CAN I EAT? Eat nutrient-rich foods, which will nourish your body and keep you healthy. The food you should eat also will depend on several factors, including:  The calories you need.  The medicines you take.  Your weight.  Your blood glucose level.  Your blood pressure level.  Your cholesterol level. You should eat a variety of foods, including:  Protein.  Lean cuts of meat.  Proteins low in saturated fats, such as fish, egg whites, and beans. Avoid processed meats.  Fruits and vegetables.  Fruits and vegetables that may help control blood glucose  levels, such as apples, mangoes, and yams.  Dairy products.  Choose fat-free or low-fat dairy products, such as milk, yogurt, and cheese.  Grains, bread, pasta, and rice.  Choose whole grain products, such as multigrain bread, whole oats, and brown rice. These foods may help control blood pressure.  Fats.  Foods containing  healthful fats, such as nuts, avocado, olive oil, canola oil, and fish. DOES EVERYONE WITH DIABETES MELLITUS HAVE THE SAME MEAL PLAN? Because every person with diabetes mellitus is different, there is not one meal plan that works for everyone. It is very important that you meet with a dietitian who will help you create a meal plan that is just right for you.   This information is not intended to replace advice given to you by your health care provider. Make sure you discuss any questions you have with your health care provider.   Document Released: 12/06/2004 Document Revised: 04/01/2014 Document Reviewed: 02/05/2013 Elsevier Interactive Patient Education 2016 [place] for Eating Away From Home If You Have Diabetes Controlling your level of blood glucose, also known as blood sugar, can be challenging. It can be even more difficult when you do not prepare your own meals. The following tips can help you manage your diabetes when you eat away from home. PLANNING AHEAD Plan ahead if you know you will be eating away from home: Ask your health care provider how to time meals and medicine if you are taking insulin. Make a list of restaurants near you that offer healthy choices. If they have a carry-out menu, take it home and plan what you will order ahead of time. Look up the restaurant you want to eat at online. Many chain and fast-food restaurants list nutritional information online. Use this information to choose the healthiest options and to calculate how many carbohydrates will be in your meal. Use a carbohydrate-counting book or mobile app to look up the carbohydrate content and serving size of the foods you want to eat. Become familiar with serving sizes and learn to recognize how many servings are in a portion. This will allow you to estimate how many carbohydrates you can eat. FREE FOODS A "free food" is any food or drink that has less than 5 g of carbohydrates per serving. Free  foods include: Many vegetables. Hard boiled eggs. Nuts or seeds. Olives. Cheeses. Meats. These types of foods make good appetizer choices and are often available at salad bars. Lemon juice, vinegar, or a low-calorie salad dressing of fewer than 20 calories per serving can be used as a "free" salad dressing.  CHOICES TO REDUCE CARBOHYDRATES Substitute nonfat sweetened yogurt with a sugar-free yogurt. Yogurt made from soy milk may also be used, but you will still want a sugar-free or plain option to choose a lower carbohydrate amount. Ask your server to take away the bread basket or chips from your table. Order fresh fruit. A salad bar often offers fresh fruit choices. Avoid canned fruit because it is usually packed in sugar or syrup. Order a salad, and eat it without dressing. Or, create a "free" salad dressing. Ask for substitutions. For example, instead of [place] fries, request an order of a vegetable such as salad, green beans, or broccoli. OTHER TIPS  If you take insulin, take the insulin once your food arrives to your table. This will ensure your insulin and food are timed correctly. Ask your server about the portion size before your order, and ask for a take-out box if  the portion has more servings than you should have. When your food comes, leave the amount you should have on the plate, and put the rest in the take-out box. Consider splitting an entree with someone and ordering a side salad.   This information is not intended to replace advice given to you by your health care provider. Make sure you discuss any questions you have with your health care provider.   Document Released: 03/11/2005 Document Revised: 11/30/2014 Document Reviewed: 06/08/2013 Elsevier Interactive Patient Education 2016 [place] DASH stands for "Dietary Approaches to Stop Hypertension." The DASH eating plan is a healthy eating plan that has been shown to reduce high blood pressure  (hypertension). Additional health benefits may include reducing the risk of type 2 diabetes mellitus, heart disease, and stroke. The DASH eating plan may also help with weight loss. WHAT DO I NEED TO KNOW ABOUT THE DASH EATING PLAN? For the DASH eating plan, you will follow these general guidelines:  Choose foods with a percent daily value for sodium of less than 5% (as listed on the food label).  Use salt-free seasonings or herbs instead of table salt or sea salt.  Check with your health care provider or pharmacist before using salt substitutes.  Eat lower-sodium products, often labeled as "lower sodium" or "no salt added."  Eat fresh foods.  Eat more vegetables, fruits, and low-fat dairy products.  Choose whole grains. Look for the word "whole" as the first word in the ingredient list.  Choose fish and skinless chicken or [place] more often than red meat. Limit fish, poultry, and meat to 6 oz (170 g) each day.  Limit sweets, desserts, sugars, and sugary drinks.  Choose heart-healthy fats.  Limit cheese to 1 oz (28 g) per day.  Eat more home-cooked food and less restaurant, buffet, and fast food.  Limit fried foods.  Cook foods using methods other than frying.  Limit canned vegetables. If you do use them, rinse them well to decrease the sodium.  When eating at a restaurant, ask that your food be prepared with less salt, or no salt if possible. WHAT FOODS CAN I EAT? Seek help from a dietitian for individual calorie needs. Grains Whole grain or whole wheat bread. Brown rice. Whole grain or whole wheat pasta. Quinoa, bulgur, and whole grain cereals. Low-sodium cereals. Corn or whole wheat flour tortillas. Whole grain cornbread. Whole grain crackers. Low-sodium crackers. Vegetables Fresh or frozen vegetables (raw, steamed, roasted, or grilled). Low-sodium or reduced-sodium tomato and vegetable juices. Low-sodium or reduced-sodium tomato sauce and paste. Low-sodium or  reduced-sodium canned vegetables.  Fruits All fresh, canned (in natural juice), or frozen fruits. Meat and Other Protein Products Ground beef (85% or leaner), grass-fed beef, or beef trimmed of fat. Skinless chicken or [place]. Ground chicken or [place]. Pork trimmed of fat. All fish and seafood. Eggs. Dried beans, peas, or lentils. Unsalted nuts and seeds. Unsalted canned beans. Dairy Low-fat dairy products, such as skim or 1% milk, 2% or reduced-fat cheeses, low-fat ricotta or cottage cheese, or plain low-fat yogurt. Low-sodium or reduced-sodium cheeses. Fats and Oils Tub margarines without trans fats. Light or reduced-fat mayonnaise and salad dressings (reduced sodium). Avocado. Safflower, olive, or canola oils. Natural peanut or almond butter. Other Unsalted popcorn and pretzels. The items listed above may not be a complete list of recommended foods or beverages. Contact your dietitian for more options. WHAT FOODS ARE NOT RECOMMENDED? Grains White bread. White pasta. White rice. Refined  cornbread. Bagels and croissants. Crackers that contain trans fat. Vegetables Creamed or fried vegetables. Vegetables in a cheese sauce. Regular canned vegetables. Regular canned tomato sauce and paste. Regular tomato and vegetable juices. Fruits Dried fruits. Canned fruit in light or heavy syrup. Fruit juice. Meat and Other Protein Products Fatty cuts of meat. Ribs, chicken wings, bacon, sausage, bologna, salami, chitterlings, fatback, hot dogs, bratwurst, and packaged luncheon meats. Salted nuts and seeds. Canned beans with salt. Dairy Whole or 2% milk, cream, half-and-half, and cream cheese. Whole-fat or sweetened yogurt. Full-fat cheeses or blue cheese. Nondairy creamers and whipped toppings. Processed cheese, cheese spreads, or cheese curds. Condiments Onion and garlic salt, seasoned salt, table salt, and sea salt. Canned and packaged gravies. Worcestershire sauce. Tartar sauce. Barbecue sauce. Teriyaki  sauce. Soy sauce, including reduced sodium. Steak sauce. Fish sauce. Oyster sauce. Cocktail sauce. Horseradish. Ketchup and mustard. Meat flavorings and tenderizers. Bouillon cubes. Hot sauce. Tabasco sauce. Marinades. Taco seasonings. Relishes. Fats and Oils Butter, stick margarine, lard, shortening, ghee, and bacon fat. Coconut, palm kernel, or palm oils. Regular salad dressings. Other Pickles and olives. Salted popcorn and pretzels. The items listed above may not be a complete list of foods and beverages to avoid. Contact your dietitian for more information. WHERE CAN I FIND MORE INFORMATION? National Heart, Lung, and Blood Institute: travelstabloid.com   This information is not intended to replace advice given to you by your health care provider. Make sure you discuss any questions you have with your health care provider.   Document Released: 02/28/2011 Document Revised: 04/01/2014 Document Reviewed: 01/13/2013 Elsevier Interactive Patient Education [employer].

## 2015-11-07 NOTE — Progress Notes (Signed)
[name], is a 55 y.o. female  [id number]  [id number]  DOB - [birth year]  CC: No chief complaint on file.      HPI: [name] is a 55 y.o. female right hand dominant,  here today to establish medical care., last seen in clinic on 10/23/15 w/ PA, w/ signf. 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   Per pt, with persistent right sided weakness and right sided shoulder pains since stroke. Pt is using walker, and leans heavily on her rue still.    C/o of increasing daytime somnolence, snores at night, increasing fatigue. Never had sleep study to r/o OSA.  Patient has No headache, No chest pain, No abdominal pain - No Nausea, No new weakness tingling or numbness, No Cough - SOB.  Significant c/o of loose stools after eats.  No si/hi/avh.  Here w/ her husband.   Review of Systems: Per HPI, o/w all systems reviewed and negative.  No Known Allergies Past Medical History:  Diagnosis Date  . Hypertension   . Iron deficiency anemia    Current Outpatient Prescriptions on File Prior to Visit  Medication Sig Dispense Refill  . amLODipine (NORVASC) 10 MG tablet Take 1 tablet (10 mg total) by mouth daily. 30 tablet 0  . chlorthalidone (HYGROTON) 25 MG tablet Take 1 tablet (25 mg total) by mouth daily. 30 tablet 0  . fluticasone (FLONASE) 50 MCG/ACT nasal spray Place 1 spray into both nostrils daily as needed for allergies or rhinitis.    . hydroxypropyl methylcellulose / hypromellose (ISOPTO TEARS / GONIOVISC) 2.5 % ophthalmic solution Place 2 drops into both eyes as needed (irritation). 15 mL 12  . iron polysaccharides (NIFEREX) 150 MG capsule  Take 1 capsule (150 mg total) by mouth 2 times daily at 12 noon and 4 pm. 60 capsule 0  . levETIRAcetam (KEPPRA) 750 MG tablet Take 1 tablet (750 mg total) by mouth 2 (two) times daily. 60 tablet 0  . metoprolol (LOPRESSOR) 50 MG tablet Take 1 tablet (50 mg total) by mouth 2 (two) times daily. 60 tablet 0  . potassium chloride SA (K-DUR,KLOR-CON) 20 MEQ tablet Take 1 tablet (20 mEq total) by mouth daily. 30 tablet 0   No current facility-administered medications on file prior to visit.    Family History  Problem Relation Age of Onset  . Diabetes Father   . Hypertension Father   . Hypertension Sister    Social History   Social History  . Marital status: Married    Spouse name: N/A  . Number of children: N/A  . Years of education: N/A   Occupational History  . Not on file.   Social History Main Topics  . Smoking status: Never Smoker  . Smokeless tobacco: Never Used  . Alcohol use No  . Drug use: No  . Sexual activity: Not on file   Other Topics Concern  . Not on file   Social History Narrative  . No narrative on file    Objective:  There were no vitals filed for this visit.  There were no vitals filed for this visit.  BP Readings from Last 3 Encounters:  11/02/15 101/70  10/23/15 129/79  10/21/15  139/76    Physical Exam: Constitutional: Patient appears well-developed and well-nourished. No distress. AAOx3, morbid obese, pleasant HENT: Normocephalic, atraumatic, External right and left ear normal. Oropharynx is clear and moist.  Eyes: Conjunctivae and EOM are normal. PERRL, no scleral icterus. Neck: Normal ROM. Neck supple. No JVD.  CVS: RRR, S1/S2 +, no murmurs, no gallops, no carotid bruit.  Pulmonary: Effort and breath sounds normal, no stridor, rhonchi, wheezes, rales.  Abdominal: Soft. BS +, no distension, tenderness, rebound or guarding.  Musculoskeletal: Normal range of motion on LUE, limited ROM right shoulder to only perpendicular to floor. No edema.  Mild ttp right shoulder diffusely, no particular articular facet ttp.  LE: bilat/ no c/c/e, pulses 2+ bilateral. Neuro: Alert.  muscle tone coordination wnl. No cranial nerve deficit grossly. Skin: Skin is warm and dry. No rash noted. Not diaphoretic. No erythema. No pallor. Psychiatric: Normal mood and affect. Behavior, judgment, thought content normal.  Lab Results  Component Value Date   WBC 7.1 10/20/2015   HGB 11.7 (L) 10/20/2015   HCT 39.0 10/20/2015   MCV 72.8 (L) 10/20/2015   PLT 362 10/20/2015   Lab Results  Component Value Date   CREATININE 1.02 (H) 10/20/2015   BUN 22 (H) 10/20/2015   NA 139 10/20/2015   K 3.6 10/20/2015   CL 100 (L) 10/20/2015   CO2 31 10/20/2015    Lab Results  Component Value Date   HGBA1C 5.7 (H) 10/07/2015   Lipid Panel     Component Value Date/Time   CHOL 112 10/08/2015 0541   TRIG 62 10/08/2015 0541   HDL 42 10/08/2015 0541   CHOLHDL 2.7 10/08/2015 0541   VLDL 12 10/08/2015 0541   LDLCALC 58 10/08/2015 0541       Depression screen PHQ 2/9 11/02/2015 10/23/2015  Decreased Interest 3 3  Down, Depressed, Hopeless 1 3  PHQ - 2 Score 4 6  Altered sleeping 3 3  Tired, decreased energy 3 3  Change in appetite 1 3  Feeling bad or failure about yourself  0 3  Trouble concentrating 3 3  Moving slowly or fidgety/restless 3 3  Suicidal thoughts 0 3  PHQ-9 Score 17 27  Difficult doing work/chores Extremely dIfficult -    Assessment and plan:   1. Benign essential HTN - Pt did not take her bp meds this am, and bp in nml range, pt had soft nml bp at [facility] on 11/02/15. - will dc kdur and chlorathalidone for now. - dash diet recd.  2. Adjustment disorder with depressed mood Pt doing better each day, not on current rx, no si/hi/avh.   3. Sp recent ICH - has neuro appt in Sept., recd keep close f/u - currently  Not on ASA  4. Anemia, iron deficiency On iron, no c/o of constipation - needs colonoscopy, currently uninsured and wking on  Medicaid.  5. Prediabetes a1c 5.7 (10/07/15) dw low carb diet w/ pt, and increase exercise - has significant c/o of diarrhea, will prescribed metformin xr 500 qdy.  6. Super obese ([place]) Metformin may help w/ some minor weightloss, recd increase exercise, calorie restrictions, small frequent meals.  7.  Day time somnolence, snoring, in setting of superobesity. Suspect osa, sleep study ordered.  8. Health maintenance - recd pap smear , pt admits to having none in years - recd MM and colonoscopy, currently uninsured. - recd pneumooccal and tdap - pt declined today.   Return in about 3 months (around 02/07/2016) for pap smear when  ever.  The patient was given clear instructions to go to ER or return to medical center if symptoms don't improve, worsen or new problems develop. The patient verbalized understanding. The patient was told to call to get lab results if they haven't heard anything in the next week.    This note has been created with Surveyor, quantity. Any transcriptional errors are unintentional.   Maren Reamer, MD, [place], [place]   11/07/2015, 9:23 AM

## 2015-11-10 DIAGNOSIS — R531 Weakness: Secondary | ICD-10-CM

## 2015-11-10 DIAGNOSIS — I119 Hypertensive heart disease without heart failure: Secondary | ICD-10-CM

## 2015-11-10 DIAGNOSIS — I69298 Other sequelae of other nontraumatic intracranial hemorrhage: Secondary | ICD-10-CM

## 2015-11-10 DIAGNOSIS — R269 Unspecified abnormalities of gait and mobility: Secondary | ICD-10-CM

## 2015-11-20 ENCOUNTER — Telehealth: Payer: Self-pay | Admitting: Internal Medicine

## 2015-11-20 MED ORDER — METOPROLOL TARTRATE 50 MG PO TABS: 50.0000 mg | ORAL_TABLET | Freq: Two times a day (BID) | ORAL | 0 refills | Status: DC

## 2015-11-20 MED ORDER — AMLODIPINE BESYLATE 10 MG PO TABS: 10.0000 mg | ORAL_TABLET | Freq: Every day | ORAL | 2 refills | Status: DC

## 2015-11-20 MED ORDER — LEVETIRACETAM 750 MG PO TABS: 750.0000 mg | ORAL_TABLET | Freq: Two times a day (BID) | ORAL | 0 refills | Status: DC

## 2015-11-20 MED ORDER — AMLODIPINE BESYLATE 10 MG PO TABS
10.0000 mg | ORAL_TABLET | Freq: Every day | ORAL | 2 refills | Status: DC
Start: 2015-11-20 — End: 2015-11-20

## 2015-11-20 MED FILL — AMLODIPINE BESYLATE 10 MG T: 10 | 30 days supply | Qty: 30 | Fill #0

## 2015-11-20 MED FILL — levETIRAcetam 750 MG TABS: 750 | 30 days supply | Qty: 60 | Fill #0

## 2015-11-20 MED FILL — ?METOPROLOL 50 MG TABLET: 50 | 30 days supply | Qty: 60 | Fill #0

## 2015-11-20 NOTE — Telephone Encounter (Signed)
Patient is needing a refill for metoprolol, keppra and amlodipine. Patient would like to use [facility] Please follow up.

## 2015-11-20 NOTE — Telephone Encounter (Signed)
Requested refills sent to [facility] pharmacy.

## 2015-11-29 ENCOUNTER — Encounter: Payer: Self-pay | Admitting: Neurology

## 2015-11-29 ENCOUNTER — Ambulatory Visit (INDEPENDENT_AMBULATORY_CARE_PROVIDER_SITE_OTHER): Payer: Self-pay | Admitting: Neurology

## 2015-11-29 VITALS — BP 138/83 | HR 73 | Ht <= 58 in | Wt 261.0 lb

## 2015-11-29 DIAGNOSIS — I10 Essential (primary) hypertension: Secondary | ICD-10-CM

## 2015-11-29 DIAGNOSIS — I611 Nontraumatic intracerebral hemorrhage in hemisphere, cortical: Secondary | ICD-10-CM

## 2015-11-29 DIAGNOSIS — E669 Obesity, unspecified: Secondary | ICD-10-CM

## 2015-11-29 DIAGNOSIS — G4733 Obstructive sleep apnea (adult) (pediatric): Secondary | ICD-10-CM

## 2015-11-29 DIAGNOSIS — M25511 Pain in right shoulder: Secondary | ICD-10-CM

## 2015-11-29 NOTE — Patient Instructions (Signed)
-   MRI brian and MRA to rule out other structural lesion to explain the brian bleed - will need to do sleep study to rule out sleep apnea - will refer to Dr. Letta Pate for right shoulder pain - Follow up with your primary care physician for stroke risk factor modification. Recommend maintain blood pressure goal <130/80, diabetes with hemoglobin A1c goal below 7.0% and lipids with LDL cholesterol goal below 70 mg/dL.  - check BP at home and record - refer to outp PT/OT/speech - continue the keppra for seizure control - According to [place] law, you can not drive until seizure free for 6 months and under physician's care.  - Please maintain seizure precautions. Do not participate in activities where a loss of awareness could hurt you or someone else.  No swimming alone, no tub bathing, no hot tubs, no driving, no operating motorized vehicles(cars, ATVs, motorbikes, etc), lawnmowers or power tools.  No standing at heights, such as rooftops, ladders or stairs. No sliding boards, monkey bars or swings, or climbing trees.  Avoid hot objects such as stoves, heaters, open fires.  Wear a helmet when riding a bicycle, scooter, skateboard, etc. and avoid areas of traffic.  Set water heater to 120 degrees or less. - follow up in 4 months

## 2015-11-29 NOTE — Progress Notes (Signed)
STROKE NEUROLOGY FOLLOW UP NOTE  NAME: [name] DOB: [birth year]  REASON FOR VISIT: stroke follow up HISTORY FROM: pt and daughter and chart  Today we had the pleasure of seeing [name] in follow-up at our [facility]rology [facility]. Pt was accompanied by daughter.   History Summary [name] is a 55 y.o. female with history of morbid obesity and hypertension admitted on 10/06/15 for LOC and GTC seizure. CT showed right temporal small ICH, and repeat CT stable hematoma. CTA and CTV head without AVM or CVT. CUS, DVT screening and TTE unremarkable. LTM  EEG no seizure. LD 58 and A1C 5.7. She was put on keppra 750mg  bid. She was discharged to CIR with planning outpt sleep study.   Interval History During the interval time, the patient has been doing well. No seizure activity. She is not driving so far. However, she complains of right shoulder pain with limited ROM at right UE. Walks with cane at home. BP today 137/78 and also stable at home. BP controlled well with 2 BP meds.   REVIEW OF SYSTEMS: Full 14 system review of systems performed and notable only for those listed below and in HPI above, all others are negative:  Constitutional:   Cardiovascular: leg swelling Ear/Nose/Throat:   Skin: rash Eyes: eye discharge   Respiratory:   Gastroitestinal:   Genitourinary:  Hematology/Lymphatic:   Endocrine:  Musculoskeletal:  Aching muscles, walking difficulty Allergy/Immunology:   Neurological:  Dizziness, HA, numbness, weakness Psychiatric: confusion, depression, anxious Sleep: snoring  The following represents the patient's updated allergies and side effects list: Allergies  Allergen Reactions  . Eggs Or Egg-Derived Products     The neurologically relevant items on the patient's problem list were reviewed on today's visit.  Neurologic Examination  A problem focused neurological exam (12 or more points of the single system neurologic examination, vital signs counts as  1 point, cranial nerves count for 8 points) was performed.  Blood pressure 138/83, pulse 73, height 4\' 10"  (1.473 m), weight 261 lb (118.4 kg), last menstrual period 02/17/2011.  General - morbid obesity, well developed, in no apparent distress.  Ophthalmologic - Fundi not visualized due to small pupils.  Cardiovascular - Regular rate and rhythm with no murmur.  Mental Status -  Level of arousal and orientation to time, place, and person were intact. Language including expression, naming, repetition, comprehension was assessed and found intact. Fund of Knowledge was assessed and was intact.  Cranial Nerves II - XII - II - Visual field intact OU. III, IV, VI - Extraocular movements intact. V - Facial sensation intact bilaterally. VII - Facial movement intact bilaterally. VIII - Hearing & vestibular intact bilaterally. X - Palate elevates symmetrically. XI - Chin turning & shoulder shrug intact bilaterally. XII - Tongue protrusion intact.  Motor Strength - The patient's strength was RUE 4/5 due to shoulder pain, and RLE 5/5, LUE and LLE 5/5.  Bulk was normal and fasciculations were absent.   Motor Tone - Muscle tone was assessed at the neck and appendages and was normal.  Reflexes - The patient's reflexes were 1+ in all extremities and she had no pathological reflexes.  Sensory - Light touch, temperature/pinprick were assessed and were normal.    Coordination - The patient had normal movements in the hands with no ataxia or dysmetria.  Tremor was absent.  Gait and Station - walk with cane, slow, broad base gait.   Functional score  mRS = 2  0 - No symptoms.   1 - No significant disability. Able to carry out all usual activities, despite some symptoms.   2 - Slight disability. Able to look after own affairs without assistance, but unable to carry out all previous activities.   3 - Moderate disability. Requires some help, but able to walk unassisted.   4 - Moderately  severe disability. Unable to attend to own bodily needs without assistance, and unable to walk unassisted.   5 - Severe disability. Requires constant nursing care and attention, bedridden, incontinent.   6 - Dead.   NIH Stroke Scale   Level Of Consciousness 0=Alert; keenly responsive 1=Not alert, but arousable by minor stimulation 2=Not alert, requires repeated stimulation 3=Responds only with reflex movements 0  LOC Questions to Month and Age 18=Answers both questions correctly 1=Answers one question correctly 2=Answers neither question correctly 0  LOC Commands      -Open/Close eyes     -Open/close grip 0=Performs both tasks correctly 1=Performs one task correctly 2=Performs neighter task correctly 0  Best Gaze 0=Normal 1=Partial gaze palsy 2=Forced deviation, or total gaze paresis 0  Visual 0=No visual loss 1=Partial hemianopia 2=Complete hemianopia 3=Bilateral hemianopia (blind including cortical blindness) 0  Facial Palsy 0=Normal symmetrical movement 1=Minor paralysis (asymmetry) 2=Partial paralysis (lower face) 3=Complete paralysis (upper and lower face) 1  Motor  0=No drift, limb holds posture for full 10 seconds 1=Drift, limb holds posture, no drift to bed 2=Some antigravity effort, cannot maintain posture, drifts to bed 3=No effort against gravity, limb falls 4=No movement Right Arm 1     Leg 0    Left Arm 0     Leg 0  Limb Ataxia 0=Absent 1=Present in one limb 2=Present in two limbs 0  Sensory 0=Normal 1=Mild to moderate sensory loss 2=Severe to total sensory loss 0  Best Language 0=No aphasia, normal 1=Mild to moderate aphasia 2=Mute, global aphasia 3=Mute, global aphasia 0  Dysarthria 0=Normal 1=Mild to moderate 2=Severe, unintelligible or mute/anarthric 0  Extinction/Neglect 0=No abnormality 1=Extinction to bilateral simultaneous stimulation 2=Profound neglect 0  Total   2     Data reviewed: I personally reviewed the images and agree with  the radiology interpretations.  Ct Head Wo Contrast 10/07/2015  1. Similar size of parenchymal hemorrhage centered at the posterior right temporal lobe, estimated volume 2.2 cc. Minimal localized edema without significant mass effect.  2. Moderate chronic microvascular ischemic disease.   10/06/2015  1. Examination is positive for a hyperdense mass within the right posterior temporoparietal lobe. In the acute setting finding is concerning for a focal hemorrhagic infarct.  2. Mild chronic small vessel ischemic change.  10/08/2015  2 x 1.9 x 1.3 cm (volume = 2.6 cc) posterior right temporal lobe hemorrhage without significant change from prior examination (when utilizing similar landmarks). The degree of surrounding vasogenic edema is minimally more prominent. No new intracranial hemorrhage.   CTA head and neck  10/09/2015 1. Negative for dural venous sinus thrombosis. The bilateral transverse and sigmoid sinuses are diminutive but patent. Both IJ bulbs are patent. Mildly prominent right jugular foramen pars nervosa incidentally noted.  2. Negative arterial findings on CTA head and neck aside from vessel tortuosity. Retropharyngeal course of both carotid arteries. 3. Stable appearance of posterior right hemisphere intra-axial hemorrhage since 10/06/2015. Mild surrounding edema with no significant mass effect. No associated cortical vein thrombosis is identified. No evidence of associated AVM. 4. Cardiomegaly re-demonstrated. Central pulmonary artery enlargement may indicate a degree of pulmonary artery hypertension. Increased  pulmonary septal thickening in the lung apices may indicate mild interstitial edema.  Mr Brain Wo Contrast 10/10/2015 IMPRESSION: 1. Stable size and appearance of posterior right temporal intraparenchymal hemorrhage with mild localized edema without significant mass effect. 2. Few additional scattered foci susceptibility artifact within the supratentorial and  infratentorial brain, most consistent with small chronic micro hemorrhages. Underlying hypertensive etiology is favored. 3. Moderate chronic microvascular ischemic disease. 4. Empty sella  Ct Angio Chest Pe W/cm &/or Wo Cm 10/06/2015  No CT evidence of pulmonary embolism. Cardiomegaly with mild edema or congestive changes. No focal consolidation.   Carotid Ultrasound 10/07/2015 No significant extracranial carotid artery stenosis demonstrated. Right vertebral artery was not clearly visualized. Left vertebral artery demonstrated antegrade flow.  LTM EEG Diffuse slowing - background slowing and diffuse rhythmic delta activity. No electrographic seizures were seen.  TTE  - Left ventricle: The cavity size was normal. There was moderate concentric hypertrophy. Systolic function was normal. The estimated ejection fraction was in the range of 60% to 65%. Wall motion was normal; there were no regional wall motion abnormalities. Features are consistent with a pseudonormal left ventricular filling pattern, with concomitant abnormal relaxation and increased filling pressure (grade 2 diastolic dysfunction). Doppler parameters are consistent with elevated ventricular end-diastolic filling pressure. - Aortic valve: Trileaflet; mildly thickened, mildly calcified leaflets. There was no regurgitation. - Aortic root: The aortic root was normal in size. - Mitral valve: Structurally normal valve. There was trivial regurgitation. - Left atrium: The atrium was normal in size. - Right ventricle: The cavity size was normal. Wall thickness was normal. Systolic function was normal. - Right atrium: The atrium was normal in size. - Tricuspid valve: There was trivial regurgitation. - Pulmonic valve: There was no regurgitation. - Pulmonary arteries: Systolic pressure was within the normal range. - Inferior vena cava: The vessel was dilated. The respirophasic diameter changes were blunted (< 50%), consistent with  elevated central venous pressure. - Pericardium, extracardiac: There was no pericardial effusion.  LE venous doppler  No evidence of DVT, superficial thrombosis, or Baker's Cyst.  Component     Latest Ref Rng & Units 10/07/2015 10/08/2015  Cholesterol     0 - 200 mg/dL  112  Triglycerides     <150 mg/dL  62  HDL Cholesterol     >40 mg/dL  42  Total CHOL/HDL Ratio     RATIO  2.7  VLDL     0 - 40 mg/dL  12  LDL (calc)     0 - 99 mg/dL  58  Hemoglobin A1C     4.8 - 5.6 % 5.7 (H)   Mean Plasma Glucose     mg/dL 117     Assessment: As you may recall, she is a 55 y.o. African American female with PMH of morbid obesity and hypertension admitted on 10/06/15 for LOC and GTC seizure. CT showed right temporal small ICH, and repeat CT stable hematoma. CTA and CTV head without AVM or CVT. CUS, DVT screening and TTE unremarkable. LTM  EEG no seizure. LD 58 and A1C 5.7. She was put on keppra 750mg  bid. She was discharged to CIR with planning outpt sleep study. During the interval time, the patient has no seizure activity. Complains of right shoulder pain with limited ROM at right UE. Will refer to Dr. Letta Pate. Repeat MRI and MRA to rule out underlying cause of ICH. Will do sleep study for evaluation of OSA. Pt again educated on no driving until seizure free for 6  months.   Plan:  - MRI brian and MRA to rule out other structural lesion to explain the ICH - will need to do sleep study to rule out sleep apnea - will refer to Dr. Letta Pate for right shoulder pain - Follow up with your primary care physician for stroke risk factor modification. Recommend maintain blood pressure goal <130/80, diabetes with hemoglobin A1c goal below 7.0% and lipids with LDL cholesterol goal below 70 mg/dL.  - check BP at home and record - refer to outp PT/OT/speech - continue the keppra for seizure control - According to [place] law, pt can not drive until seizure free for 6 months and under physician's care.  - Please  maintain seizure precautions.  - follow up in 4 months  I spent more than 25 minutes of face to face time with the patient. Greater than 50% of time was spent in counseling and coordination of care. We discussed further [place] work up, no driving, sleep study for OSA.    Orders Placed This Encounter  Procedures  . MR Brain W Wo Contrast    Standing Status:   Future    Standing Expiration Date:   01/30/2017    Order Specific Question:   Reason for Exam (SYMPTOM  OR DIAGNOSIS REQUIRED)    Answer:   ICH follow up    Order Specific Question:   Preferred imaging location?    Answer:   Internal    Order Specific Question:   Does the patient have a pacemaker or implanted devices?    Answer:   No    Order Specific Question:   What is the patient's sedation requirement?    Answer:   No Sedation  . MR MRA HEAD WO CONTRAST    Standing Status:   Future    Standing Expiration Date:   01/30/2017    Order Specific Question:   Reason for Exam (SYMPTOM  OR DIAGNOSIS REQUIRED)    Answer:   ICH follow up    Order Specific Question:   Preferred imaging location?    Answer:   Internal    Order Specific Question:   Does the patient have a pacemaker or implanted devices?    Answer:   No    Order Specific Question:   What is the patient's sedation requirement?    Answer:   No Sedation  . Ambulatory referral to Sleep Studies    Referral Priority:   Routine    Referral Type:   Consultation    Referral Reason:   Specialty Services Required    Number of Visits Requested:   1  . Ambulatory referral to Physical Medicine Rehab    Referral Priority:   Routine    Referral Type:   Rehabilitation    Referral Reason:   Specialty Services Required    Referred to Provider:   Charlett Blake, MD    Requested Specialty:   Physical Medicine and Rehabilitation    Number of Visits Requested:   1  . Ambulatory referral to Physical Therapy    Referral Priority:   Routine    Referral Type:   Physical Medicine     Referral Reason:   Specialty Services Required    Requested Specialty:   Physical Therapy    Number of Visits Requested:   1  . Ambulatory referral to Occupational Therapy    Referral Priority:   Routine    Referral Type:   Occupational Therapy    Referral Reason:  Specialty Services Required    Requested Specialty:   Occupational Therapy    Number of Visits Requested:   1  . Ambulatory referral to Speech Therapy    Referral Priority:   Routine    Referral Type:   Speech Therapy    Referral Reason:   Specialty Services Required    Requested Specialty:   Speech Pathology    Number of Visits Requested:   1    Meds ordered this encounter  Medications  . chlorthalidone (HYGROTON) 25 MG tablet    Sig: Take 25 mg by mouth daily.    Refill:  0  . KLOR-CON M20 20 MEQ tablet    Sig: Take 20 mEq by mouth daily.    Refill:  0    Patient Instructions  - MRI brian and MRA to rule out other structural lesion to explain the brian bleed - will need to do sleep study to rule out sleep apnea - will refer to Dr. Letta Pate for right shoulder pain - Follow up with your primary care physician for stroke risk factor modification. Recommend maintain blood pressure goal <130/80, diabetes with hemoglobin A1c goal below 7.0% and lipids with LDL cholesterol goal below 70 mg/dL.  - check BP at home and record - refer to outp PT/OT/speech - continue the keppra for seizure control - According to [place] law, you can not drive until seizure free for 6 months and under physician's care.  - Please maintain seizure precautions. Do not participate in activities where a loss of awareness could hurt you or someone else.  No swimming alone, no tub bathing, no hot tubs, no driving, no operating motorized vehicles(cars, ATVs, motorbikes, etc), lawnmowers or power tools.  No standing at heights, such as rooftops, ladders or stairs. No sliding boards, monkey bars or swings, or climbing trees.  Avoid hot objects such as stoves,  heaters, open fires.  Wear a helmet when riding a bicycle, scooter, skateboard, etc. and avoid areas of traffic.  Set water heater to 120 degrees or less. - follow up in 4 months     Rosalin Hawking, MD PhD [facility] [address] [place],  [address] [phone]

## 2015-12-12 ENCOUNTER — Ambulatory Visit
Admission: RE | Admit: 2015-12-12 | Discharge: 2015-12-12 | Disposition: A | Discharge status: Home / Self Care | Payer: Self-pay | Source: Ambulatory Visit | Attending: Neurology | Admitting: Neurology

## 2015-12-12 DIAGNOSIS — I611 Nontraumatic intracerebral hemorrhage in hemisphere, cortical: Secondary | ICD-10-CM

## 2015-12-12 MED ORDER — GADOBENATE DIMEGLUMINE 529 MG/ML IV SOLN
20.0000 mL | Freq: Once | INTRAVENOUS | Status: AC | PRN
  Administered 2015-12-12: 20 mL via INTRAVENOUS

## 2015-12-18 ENCOUNTER — Telehealth: Payer: Self-pay

## 2015-12-18 NOTE — Telephone Encounter (Signed)
LFt vm for patient to call back about MRI results.

## 2015-12-18 NOTE — Telephone Encounter (Signed)
-----   Message from Rosalin Hawking, MD sent at 12/14/2015  7:01 PM EDT ----- Could you please let the patient know that the MRI test done recently in our office showed resolved bleeding and no underlying tumor or vascular abnormalities. Please continue current treatment. Thanks.  Rosalin Hawking, MD PhD Stroke Neurology 12/14/2015 7:01 PM

## 2015-12-19 ENCOUNTER — Other Ambulatory Visit: Payer: Self-pay | Admitting: Internal Medicine

## 2015-12-19 MED FILL — AMLODIPINE BESYLATE 10 MG T: 10 | 30 days supply | Qty: 30 | Fill #1

## 2015-12-19 MED FILL — levETIRAcetam 750 MG TABS: 750 | 30 days supply | Qty: 60 | Fill #0

## 2015-12-19 NOTE — Telephone Encounter (Signed)
Notes Recorded by Marval Regal, RN on 12/19/2015 at 10:57 AM EDT Rn call patient about her MRI results. let the patient know that the MRI test done recently in our office showed resolved bleeding and no underlying tumor or vascular abnormalities. Please continue current treatment. PT verbalized understanding.  ------

## 2015-12-21 ENCOUNTER — Other Ambulatory Visit: Payer: Self-pay | Admitting: Internal Medicine

## 2015-12-22 MED FILL — ?METOPROLOL 50 MG TABLET: 50 | 30 days supply | Qty: 60 | Fill #0

## 2016-01-16 MED FILL — AMLODIPINE BESYLATE 10 MG T: 10 | 30 days supply | Qty: 30 | Fill #2

## 2016-01-18 ENCOUNTER — Ambulatory Visit: Payer: Medicaid Other

## 2016-01-18 ENCOUNTER — Ambulatory Visit: Payer: Medicaid Other | Admitting: Occupational Therapy

## 2016-01-18 ENCOUNTER — Encounter: Payer: Self-pay | Admitting: Rehabilitation

## 2016-01-18 ENCOUNTER — Ambulatory Visit: Payer: Medicaid Other | Attending: Neurology | Admitting: Rehabilitation

## 2016-01-18 DIAGNOSIS — R2689 Other abnormalities of gait and mobility: Secondary | ICD-10-CM | POA: Insufficient documentation

## 2016-01-18 DIAGNOSIS — R41842 Visuospatial deficit: Secondary | ICD-10-CM | POA: Insufficient documentation

## 2016-01-18 DIAGNOSIS — R471 Dysarthria and anarthria: Secondary | ICD-10-CM

## 2016-01-18 DIAGNOSIS — M25511 Pain in right shoulder: Secondary | ICD-10-CM | POA: Diagnosis present

## 2016-01-18 DIAGNOSIS — M6281 Muscle weakness (generalized): Secondary | ICD-10-CM | POA: Diagnosis present

## 2016-01-18 DIAGNOSIS — I69115 Cognitive social or emotional deficit following nontraumatic intracerebral hemorrhage: Secondary | ICD-10-CM

## 2016-01-18 DIAGNOSIS — R2681 Unsteadiness on feet: Secondary | ICD-10-CM | POA: Insufficient documentation

## 2016-01-18 DIAGNOSIS — R4701 Aphasia: Secondary | ICD-10-CM | POA: Insufficient documentation

## 2016-01-18 DIAGNOSIS — R278 Other lack of coordination: Secondary | ICD-10-CM

## 2016-01-18 DIAGNOSIS — I69253 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 non-dominant side: Secondary | ICD-10-CM | POA: Diagnosis present

## 2016-01-18 NOTE — Therapy (Addendum)
[facility] [address] [place], [place], [address] Phone: [phone]   Fax:  [phone]  Occupational Therapy Evaluation  Patient Details  Name: [name] MRN: [id number] Date of Birth: 1961/01/11 Referring Provider: Dr. Erlinda Hong  Encounter Date: 01/18/2016      OT End of Session - 01/18/16 1310    Visit Number 1   Number of Visits 17   Date for OT Re-Evaluation 03/17/16   Authorization Type Self pay, MCD pending   Authorization - Visit Number 1   Authorization - Number of Visits 10   OT Start Time 1105   OT Stop Time 1145   OT Time Calculation (min) 40 min   Activity Tolerance Patient tolerated treatment well   Behavior During Therapy [facility] for tasks assessed/performed      Past Medical History:  Diagnosis Date  . Headache   . Hypertension   . Iron deficiency anemia   . Seizures ([place])   . Stroke [facility])     Past Surgical History:  Procedure Laterality Date  . CARDIAC CATHETERIZATION  04/2010   Dr. Terrence Dupont    There were no vitals filed for this visit.      Subjective Assessment - 01/18/16 1107    Subjective  Pt reports significant right shoulder pain    Patient Stated Goals reduce pain and be able to move right arm   Currently in Pain? Yes   Pain Score 10-Worst pain ever   Pain Location Shoulder   Pain Orientation Right   Pain Descriptors / Indicators Throbbing   Pain Type Chronic pain   Pain Onset More than a month ago   Pain Frequency Constant   Aggravating Factors  malpositioning, weather changes   Pain Relieving Factors repositioning           [facility] OT Assessment - 01/18/16 1113      Assessment   Diagnosis CVA   Referring Provider Dr. Erlinda Hong   Onset Date 10/06/15   Prior Therapy CIR 7/20-7/28/17     Precautions   Precautions Fall     Balance Screen   Has the patient fallen in the past 6 months Yes   How many times? 1   Has the patient had a decrease in activity level because of  a fear of falling?  No   Is the patient reluctant to leave their home because of a fear of falling?  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), Nurse, learning disability as Industrial/product designer, and another CNA job   Leisure liked to go to [employer]     ADL   Eating/Feeding Independent   Grooming Modified independent  needs prn assist with hair   Upper Body Bathing Modified independent  has shower bench and long handled sponge   Lower Body Bathing Modified independent   Upper Body Dressing Minimal assistance   Lower Body Dressing Minimal assistance   Toilet Tranfer Modified independent  pt is walking holding onto furniture, encouraged to use cane   Tub/Shower Transfer Modified independent     IADL   Shopping Needs to be accompanied on any shopping trip   Light Housekeeping Performs light daily tasks such as dishwashing, bed making  Meal Prep Able to complete simple warm meal prep  with supervision   Medication Management Is responsible for taking medication in correct dosages at correct time   Financial Management Requires supervision/minimal cuing  husband is assists     Mobility   Mobility Status Independent  supervision     Written Expression   Dominant Hand Left     Vision - History   Baseline Vision Wears glasses all the time     Vision Assessment   Vision Assessment Vision impaired  _ to be further tested in functional context   Comment Pt reports blurry vision, double at times, increased difficulty reading     Cognition   Overall Cognitive Status Impaired/Different from baseline  to be further assessed in a functional context   Memory Impaired   Memory Impairment Decreased short term memory     Sensation   Light Touch Impaired  Detail   Light Touch Impaired Details Impaired RUE     Coordination   Gross Motor Movements are Fluid and Coordinated No   Fine Motor Movements are Fluid and Coordinated No   9 Hole Peg Test Right;Left     ROM / Strength   AROM / PROM / Strength AROM     AROM   Overall AROM  Deficits;Due to pain   Overall AROM Comments Right shoulder flexion 70*, R shoulder abduction 60* limited by pain, remaining A/ROM is grossly WFLS  strength not tested due to pain     Strength   Overall Strength Deficits;Unable to assess;Due to pain     Hand Function   Right Hand Gross Grasp Impaired   Right Hand Grip (lbs) 20 lbs   Left Hand Gross Grasp Functional   Left Hand Grip (lbs) 60 lbs         Therapist encouraged pt to contact Dr. Jeannett Senior lregarding her shoulder pain as pt may benefit from medication. Pt reports she is unable to sleep due to pain.                OT Short Term Goals - 01/18/16 1301      OT SHORT TERM GOAL #1   Title I with inital HEP. due 02/17/16   Baseline dependent   Time 4   Period Weeks   Status New     OT SHORT TERM GOAL #2   Title Pt will verbalize understanding of RUE positioning to minimize pain and risk for injury.   Baseline dependent   Time 4   Period Weeks   Status New     OT SHORT TERM GOAL #3   Title Pt will verbalize understanding of compensatory strategies for short term memory deficits.   Baseline dependent   Time 4   Period Weeks   Status New     OT SHORT TERM GOAL #4   Title Pt will demonstrate 90 shoulder flexion with pain less than or equal to 4/10 for functional reach.   Baseline 70*   Time 4   Period Weeks   Status New     OT SHORT TERM GOAL #5   Title Pt will perform dressing modified independently.   Baseline min A   Time 4   Period Weeks   Status New            OT Long Term Goals - 01/18/16 1304      OT LONG TERM GOAL #1   Title Pt will perform mod complex cooking/ home management at a modified indpendent  level demonstrating good safety awareness. due 03/17/16   Baseline performing light home management and basic cooking with assistance   Time 8   Period Weeks   Status New     OT LONG TERM GOAL #2   Title Pt will demonstrate ability to perform tabletop and environmental scanning tasks with 95% better accuracy and no significant diplopia.   Baseline Pt reports difficulty reading due to significant diplopia, pt does not perform effective environmental scanning due to c/o of diplopia   Time 8   Period Weeks   Status New     OT LONG TERM GOAL #3   Title Pt will resume use of RUE as a non dominant assist with ADLs/ IADLs at least 50% of the time with pain no greater than 3/10   Baseline unable to use RUE consistently  due to severe pain   Time 8   Period Weeks   Status New     OT LONG TERM GOAL #4   Title Pt will demonstrate improved fine motor coordiantion as evidenced by decreasing RUE 9 hole peg test score by 4 secs from eval.   Baseline to be assessed   Time 8   Period Weeks   Status New     OT LONG TERM GOAL #5   Title Pt will demonstrate ability to perform  a physical and cognitive task simultaneously with 90% or better accuracy.   Baseline to be further assessed, impaired attention   Time 8   Period Weeks   Status New                    Plan - 01/18/16 1255    Clinical Impression Statement Pt is a 55 y.o female with PMH including obesity, HTN, and iron deficiency anemia s/p right posterior parietal [id number]) with seizures on 10/06/15. Pt received CIR from 10/12/15-10/20/15. Pt was d/c home and received home health OT. Pt presents with right UE pain, decreased ROM, decreased strength, decreased coordination, decreased blance, cognitive and visuospatial deficits which imped performance of ADLs/ IADLs. Pt can benefit from skille occupational therapy to address these deficits.    Rehab Potential Good   OT Frequency 2x / week  plus eval   OT Duration 8 weeks   OT  Treatment/Interventions Self-care/ADL training;Fluidtherapy;Moist Heat;DME and/or AE instruction;Splinting;Patient/family education;Balance training;Therapeutic exercises;Therapeutic Higher education careers adviser;Therapeutic exercise;Ultrasound;Cryotherapy;Iontophoresis;Neuromuscular education;Functional Mobility Training;Passive range of motion;Cognitive remediation/compensation;Visual/perceptual remediation/compensation;Manual Therapy;Electrical Stimulation;Parrafin   Plan  test 9 hole peg test and record, pain reduction strategies for right shoulder/, education regarding positioning Future visits -assess tabletop   Consulted and Agree with Plan of Care Patient;Family member/caregiver   Family Member Consulted husband      Patient will benefit from skilled therapeutic intervention in order to improve the following deficits and impairments:  Abnormal gait, Decreased coordination, Decreased range of motion, Impaired sensation, Increased edema, Decreased safety awareness, Decreased endurance, Decreased activity tolerance, Decreased knowledge of precautions, Pain, Impaired UE functional use, Decreased balance, Decreased cognition, Decreased mobility, Decreased strength, Impaired vision/preception, Impaired perceived functional ability  Visit Diagnosis: Other lack of coordination  Unsteadiness on feet  Acute pain of right shoulder  Muscle weakness (generalized)  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 Obstructive sleep apnea 11/29/2015  . Right shoulder pain 11/29/2015  . Adjustment disorder with depressed mood 10/19/2015  . Nocturnal enuresis   . Hypokalemia   . Anemia,  iron deficiency   . Super obese ([place])   . Prediabetes   . Orthostatic hypotension   . Supplemental oxygen dependent   . Diastolic dysfunction   . Tachypnea   . AKI (acute kidney  injury) ([place])   . Acute blood loss anemia   . Lethargy   . Wheezing   . Benign essential HTN   . ICH (intracerebral hemorrhage) ([place]) 10/06/2015    [place]din 01/18/2016, 1:10 PM Theone Murdoch, OTR/L [phone] Phone: [phone] 1:13 PM 10/26/17[facility] [address] [place], [place], [address] Phone: [phone]   Fax:  [phone]  Name: [name] MRN: [id number] Date of Birth: [birth year]

## 2016-01-18 NOTE — Therapy (Signed)
[facility] [address] [place], [place], [address] Phone: [phone]   Fax:  [phone]  Physical Therapy Evaluation  Patient Details  Name: [name] MRN: [id number] Date of Birth: [birth year] Referring Provider: Rosalin Hawking, MD  Encounter Date: 01/18/2016      PT End of Session - 01/18/16 1613    Visit Number 1   Number of Visits 17   Date for PT Re-Evaluation 03/18/16   Authorization Type Possible MCD-waiting to hear from Lattie Haw   PT Start Time 1016   PT Stop Time 1100   PT Time Calculation (min) 44 min   Activity Tolerance Patient tolerated treatment well   Behavior During Therapy [facility] for tasks assessed/performed      Past Medical History:  Diagnosis Date  . Headache   . Hypertension   . Iron deficiency anemia   . Seizures ([place])   . Stroke [facility])     Past Surgical History:  Procedure Laterality Date  . CARDIAC CATHETERIZATION  04/2010   Dr. Terrence Dupont    There were no vitals filed for this visit.       Subjective Assessment - 01/18/16 1027    Subjective "My shoulder hurts so bad, its making me wake up at night, tossing and turning trying to get comfortable.  I haven't gotten my balance back yet either."    Patient is accompained by: Family member  husband   Limitations House hold activities;Walking   Currently in Pain? Yes   Pain Score 7    Pain Location Shoulder   Pain Orientation Right   Pain Descriptors / Indicators Throbbing  like something is poking her   Pain Type Chronic pain   Pain Radiating Towards down to forearm   Pain Onset More than a month ago   Pain Frequency Constant   Aggravating Factors  malpositioning   Pain Relieving Factors re-positioning, OTC meds            [facility] PT Assessment - 01/18/16 1034      Assessment   Medical Diagnosis CVA   Referring Provider Rosalin Hawking, MD   Onset Date/Surgical Date 10/06/15   Hand Dominance Left   Prior Therapy acute, IP  rehab, HHPT     Precautions   Precautions Fall     Balance Screen   Has the patient fallen in the past 6 months No   Has the patient had a decrease in activity level because of a fear of falling?  Yes   Is the patient reluctant to leave their home because of a fear of falling?  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), Nurse, learning disability as Industrial/product designer, and another CNA job   Leisure liked to go to [employer]  takes care of husband's mother, and disabled brother     Cognition   Overall Cognitive Status Within Functional Limits for tasks assessed  Sensation   Light Touch Impaired Detail   Light Touch Impaired Details Impaired RUE;Impaired RLE   Hot/Cold Appears Intact   Proprioception Appears Intact     Coordination   Gross Motor Movements are Fluid and Coordinated Yes  in LEs   Fine Motor Movements are Fluid and Coordinated Yes  in LEs     ROM / Strength   AROM / PROM / Strength Strength     Strength   Overall Strength Deficits   Overall Strength Comments grossly WFL except R hip flex 3+/5, R knee flex 2+/5  difficult to fully assess due to body habitus     Transfers   Transfers Sit to Stand;Stand to Sit   Sit to Stand 6: Modified independent (Device/Increase time)   Stand to Sit 6: Modified independent (Device/Increase time)     Ambulation/Gait   Ambulation/Gait Yes   Ambulation/Gait Assistance 5: Supervision;4:  Min guard   Ambulation Distance (Feet) 100 Feet   Assistive device Straight cane   Gait Pattern Step-through pattern;Decreased stride length;Lateral hip instability;Trunk flexed;Wide base of support   Ambulation Surface Level;Indoor   Gait velocity .98 ft/sec   Gait Comments deferred stairs due to BP concerns                           PT Education - 01/18/16 1612    Education provided Yes   Education Details PT recommends continuing to check BP and if continues to be elevated, notify MD.  Education on evaluation, POC, goals   Person(s) Educated Patient;Spouse   Methods Explanation   Comprehension Verbalized understanding          PT Short Term Goals - 01/18/16 1629      PT SHORT TERM GOAL #1   Title Pt will initiate HEP in order to indicate improved functional mobility.  (Target Date: 02/15/16)   Baseline dependent   Time 4   Period Weeks     PT SHORT TERM GOAL #2   Title Will assess BERG balance test and improve score by 4 points in order to indicate decreased fall risk.     Baseline unable to assess due to time constraint   Time 4   Period Weeks   Status New     PT SHORT TERM GOAL #3   Title Pt will improve gait speed to 1.58 ft/sec in order to indicate decreased fall risk and improved efficiency of gait.     Baseline .98 ft/sec   Time 4   Period Weeks   Status New     PT SHORT TERM GOAL #4   Title Will assess 6MWT and improve distance by 150' in order to indicate improved functional endurance.     Time 4   Period Weeks   Status New     PT SHORT TERM GOAL #5   Title Pt will ambulate 300' w/ LRAD at mod I level over indoor surfaces in order to indicate safe home negotiation.     Baseline 100' w/ SPC at min/guard level   Time 4   Period Weeks   Status New           PT Long Term Goals - 01/18/16 1633      PT LONG TERM GOAL #1   Title Pt will be independent with HEP in order to indicate improved functional mobility.  (Target Date:  03/14/16)   Baseline dependent   Time 8   Period Weeks  Status New     PT LONG TERM GOAL #2   Title Pt will improve BERG balance test score 8 points from baseline in order to indicate decreased fall risk.     Baseline unable to assess due to time constraint   Time 8   Period Weeks   Status New     PT LONG TERM GOAL #3   Title Pt will increase gait speed to 2.18 ft/sec w/ LRAD in order to indicate decreased fall risk and improved efficiency of gait.     Baseline .98 ft/sec   Time 8   Period Weeks   Status New     PT LONG TERM GOAL #4   Title Pt will verbalize return to community fitness program in order to indicate ongoing work on functional endurance.     Baseline currently not involved in program.    Time 8   Period Weeks   Status New     PT LONG TERM GOAL #5   Title Pt will ambulate 500' w/ LRAD at mod I level over unlevel paved surfaces (including ramp/curb) in order to indicate safe return to community.     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  Upon PT evaluation, note that she reports feeling headache and some dizziness, therefore BP checked prior to activity and was 137/96 and following 9m walk test was 142/79.  Gait speed was .98 ft/sec indicative of recurrent fall risk.   Pt is of evolving presentsation and moderate complexity from PT POC standpoing.  Pt will greatly benefit from skilled OP neuro PT in order to address deficits.    Rehab Potential Good   Clinical Impairments Affecting Rehab Potential possible visit limitations   PT Frequency 2x / week   PT Duration 8 weeks   PT  Treatment/Interventions ADLs/Self Care Home Management;Electrical Stimulation;DME Instruction;Gait training;Stair training;Functional mobility training;Therapeutic activities;Neuromuscular re-education;Balance training;Therapeutic exercise;Patient/family education;Orthotic Fit/Training;Vestibular   PT Next Visit Plan BERG, 6MWT, initiate HEP for functional strength, balance, add walking program   Consulted and Agree with Plan of Care Patient;Family member/caregiver   Family Member Consulted husband      Patient will benefit from skilled therapeutic intervention in order to improve the following deficits and impairments:  Abnormal gait, Decreased activity tolerance, Decreased balance, Decreased mobility, Decreased strength, Dizziness, Impaired perceived functional ability, Impaired flexibility, Improper body mechanics, Postural dysfunction, Impaired UE functional use  Visit Diagnosis: 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 non-dominant side ([place]) - Plan: PT plan of care cert/re-cert  Unsteadiness on feet - Plan: PT plan of care cert/re-cert  Other abnormalities of gait and mobility - Plan: PT plan of care cert/re-cert  Muscle weakness (generalized) - Plan: PT plan of care cert/re-cert      G-Codes - [id number]    Functional Assessment Tool Used Gait speed .98 ft/sec w/ SPC at min/guard level   Functional Limitation Mobility: Walking and moving around   Mobility: Walking and Moving Around Current Status [id number]) At least 60 percent but less than 80 percent impaired, limited or restricted   Mobility: Walking and  Moving Around Goal Status [phone]) At least 20 percent but less than 40 percent impaired, limited or restricted       Problem List Patient Active Problem List   Diagnosis Date Noted  . Obstructive sleep apnea 11/29/2015  . Right shoulder pain 11/29/2015  . Adjustment disorder with depressed mood 10/19/2015  . Nocturnal  enuresis   . Hypokalemia   . Anemia, iron deficiency   . Super obese ([place])   . Prediabetes   . Orthostatic hypotension   . Supplemental oxygen dependent   . Diastolic dysfunction   . Tachypnea   . AKI (acute kidney injury) ([place])   . Acute blood loss anemia   . Lethargy   . Wheezing   . Benign essential HTN   . ICH (intracerebral hemorrhage) ([place]) 10/06/2015    Cameron Sprang, PT, MPT [facility] [address] [place], [place], [address] Phone: [phone]   Fax:  [phone] 01/18/16, 4:43 PM  Name: [name] MRN: [id number] Date of Birth: [birth year]

## 2016-01-18 NOTE — Therapy (Signed)
[facility] [address] [place], [place], [address] Phone: [phone]   Fax:  [phone]  Speech Language Pathology Evaluation  Patient Details  Name: [name] MRN: [id number] Date of Birth: 01-19-1961 Referring Provider: Debbe Odea MD  Encounter Date: 01/18/2016      End of Session - 01/18/16 1144    Visit Number 1   Number of Visits 17   Date for SLP Re-Evaluation 04/05/16   SLP Start Time 0932   SLP Stop Time  1016   SLP Time Calculation (min) 44 min   Activity Tolerance Patient tolerated treatment well      Past Medical History:  Diagnosis Date  . Headache   . Hypertension   . Iron deficiency anemia   . Seizures ([place])   . Stroke [facility])     Past Surgical History:  Procedure Laterality Date  . CARDIAC CATHETERIZATION  04/2010   Dr. Terrence Dupont    There were no vitals filed for this visit.      Subjective Assessment - 01/18/16 1142    Subjective Pt reports [place] worked with "slurred speech, finding my words, and math."   Patient is accompained by: Family member  husband   Currently in Pain? No/denies            SLP Evaluation [facility] - 01/18/16 0950      SLP Visit Information   SLP Received On 01/18/16   Referring Provider Debbe Odea MD   Onset Date 10-07-15   Medical Diagnosis CVA     Subjective   Subjective Pt with CVA in July 2017. Is a tech/CNA.     Prior Functional Status   Cognitive/Linguistic Baseline Within functional limits   Type of Home House    Lives With Spouse   Vocation Full time employment     Cognition   Overall Cognitive Status Within Functional Limits for tasks assessed     Auditory Comprehension   Overall Auditory Comprehension Appears within functional limits for tasks assessed     Verbal Expression   Overall Verbal Expression Impaired   Naming Impairment   Confrontation 75-100% accurate   Other Naming Comments Pt with occasional hesitations/pauses  in conversation of abnormal length. Pt usually compensated successfully. Speech functional at simple conversation.   Effective Techniques Phonemic cues;Semantic cues;Open ended questions     Motor Speech   Overall Motor Speech Impaired   Phonation Low vocal intensity  64dB average in conversation, likely poor breath support   Intelligibility Intelligible   Effective Techniques Increased vocal intensity     Standardized Assessments   Standardized Assessments  Boston Naming Test-2nd edition   [place] Naming Test-2nd edition  44/60 (>51/60 is WNL)                         SLP Education - 01/18/16 1144    Education provided Yes   Education Details pt speaking at low volume, ST eval results   Person(s) Educated Patient;Spouse   Methods Explanation   Comprehension Verbalized understanding          SLP Short Term Goals - 01/18/16 1148      SLP SHORT TERM GOAL #1   Title pt will incr speech volume to average 69dB for 18/20 sentence responses over three sessions   Time 4   Period Weeks   Status New     SLP SHORT TERM GOAL #2   Title pt will name 8-10 items in  mod complex category with modified independence over three sessions   Time 4   Period Weeks   Status New     SLP SHORT TERM GOAL #3   Title pt will demo WFL 5 minutes mod-complex conversation with rare min A over two sessions   Time 4   Period Weeks   Status New          SLP Long Term Goals - 02-01-16 1204      SLP LONG TERM GOAL #1   Title pt will demo WFL 10 minutes mod complex conversation with modified independence (using compensations) in three sessions   Time 8   Period Weeks   Status New     SLP LONG TERM GOAL #2   Title pt will name at least average of 6 items in complex/abstract category over three sessions   Time 8   Period Weeks   Status New     SLP LONG TERM GOAL #3   Title pt will incr speech volume in 10 minutes mod complex conversation to average 70dB over three sessions    Time 8   Period Weeks   Status New          Plan - 01-Feb-2016 1145    Clinical Impression Statement Pt presents today with expressive aphasia c/b anomia/dysnomia, as well as low speaking volume (average 64dB in 6 minute conversation) likely due to reduced breath support. She uses language compensatory measures of description, synonym usage, and circumlocution, but had some instances today of freezing and attempting to generate the correct word for too long before trying compensations.   Speech Therapy Frequency 2x / week   Duration --  8 weeks   Treatment/Interventions Compensatory strategies;Functional tasks;Cueing hierarchy;Internal/external aids;Multimodal communcation approach;SLP instruction and feedback;Language facilitation;Patient/family education   Potential to Achieve Goals Good   Consulted and Agree with Plan of Care Patient      Patient will benefit from skilled therapeutic intervention in order to improve the following deficits and impairments:   Aphasia  Dysarthria and anarthria      G-Codes - February 01, 2016 07/07/06    Functional Assessment Tool Used NOMS - 5 (35% impaired)   Functional Limitations Spoken language expressive   Spoken Language Expression Current Status [phone]) At least 20 percent but less than 40 percent impaired, limited or restricted   Spoken Language Expression Goal Status [id number]) At least 1 percent but less than 20 percent impaired, limited or restricted      Problem List Patient Active Problem List   Diagnosis Date Noted  . Obstructive sleep apnea 11/29/2015  . Right shoulder pain 11/29/2015  . Adjustment disorder with depressed mood 10/19/2015  . Nocturnal enuresis   . Hypokalemia   . Anemia, iron deficiency   . Super obese ([place])   . Prediabetes   . Orthostatic hypotension   . Supplemental oxygen dependent   . Diastolic dysfunction   . Tachypnea   . AKI (acute kidney injury) ([place])   . Acute blood loss anemia   . Lethargy   . Wheezing   .  Benign essential HTN   . ICH (intracerebral hemorrhage) ([place]) 10/06/2015    [facility] ,[place], [place]  01-Feb-2016, 12:09 PM  [facility] [place] [address] [place], [place], [address] Phone: [phone]   Fax:  [phone]  Name: [name] MRN: [id number] Date of Birth: [birth year]

## 2016-01-18 NOTE — Patient Instructions (Signed)
We will work on your word finding and improving your loudness in speech.

## 2016-01-19 ENCOUNTER — Telehealth: Payer: Self-pay

## 2016-01-19 NOTE — Telephone Encounter (Signed)
Patient called stating that she has started therapy  and was told by her therapist that her arm is inflamed. She was wondering if she could get mild pain medication so she could do her therapy.  Patient does have an upcoming appointment 11/10  Please advise.

## 2016-01-20 NOTE — Telephone Encounter (Signed)
She may need to see a physician.  I don't have any documentation of and "inflamfed arm" on my last visit.  If this is a new finding, she needs to see her PCP, or possibly urgent cause as the etiology could be a number of things. Thanks.

## 2016-01-23 ENCOUNTER — Ambulatory Visit: Payer: Medicaid Other | Admitting: Physical Therapy

## 2016-01-23 ENCOUNTER — Encounter: Payer: Self-pay | Admitting: Physical Therapy

## 2016-01-23 DIAGNOSIS — I69253 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 non-dominant side: Secondary | ICD-10-CM | POA: Diagnosis not present

## 2016-01-23 DIAGNOSIS — R2681 Unsteadiness on feet: Secondary | ICD-10-CM

## 2016-01-23 DIAGNOSIS — M6281 Muscle weakness (generalized): Secondary | ICD-10-CM

## 2016-01-23 DIAGNOSIS — R2689 Other abnormalities of gait and mobility: Secondary | ICD-10-CM

## 2016-01-23 MED FILL — ?METOPROLOL 50 MG TABLET: 50 | 30 days supply | Qty: 60 | Fill #1

## 2016-01-23 MED FILL — levETIRAcetam 750 MG TABS: 750 | 30 days supply | Qty: 60 | Fill #1

## 2016-01-23 NOTE — Therapy (Addendum)
[facility] [address] [place], [place], [address] Phone: [phone]   Fax:  [phone]  Physical Therapy Treatment  Patient Details  Name: [name] MRN: [id number] Date of Birth: 25-Jun-1960 Referring Provider: Rosalin Hawking, MD  Encounter Date: 01/23/2016      PT End of Session - 01/23/16 1517    Visit Number 2   Number of Visits 17   Date for PT Re-Evaluation 03/18/16   Authorization Type Possible MCD-waiting to hear from [place]   PT Start Time [id number]   PT Stop Time 1504   PT Time Calculation (min) 49 min   Equipment Utilized During Treatment Gait belt   Activity Tolerance Patient tolerated treatment well   Behavior During Therapy [facility] for tasks assessed/performed      Past Medical History:  Diagnosis Date  . Headache   . Hypertension   . Iron deficiency anemia   . Seizures ([place])   . Stroke [facility])     Past Surgical History:  Procedure Laterality Date  . CARDIAC CATHETERIZATION  04/2010   Dr. Terrence Dupont    There were no vitals filed for this visit.      Subjective Assessment - 01/23/16 1419    Subjective Patient reports her shoulder is hurting. It work her up [place] and she had to take medecine. She is having headaches off and on but not one at present.   Patient is accompained by: Family member   Limitations House hold activities;Walking   Currently in Pain? Yes   Pain Score 9    Pain Location Shoulder   Pain Orientation Right   Pain Type Chronic pain   Pain Onset More than a month ago   Pain Frequency Constant           [facility] PT Assessment - 01/23/16 1422      6 Minute Walk- Baseline   6 Minute Walk- Baseline yes   BP (mmHg) 134/87   HR (bpm) 82   02 Sat (%RA) 99 %   Modified Borg Scale for Dyspnea 0- Nothing at all   Perceived Rate of Exertion (Borg) 6-     6 Minute walk- Post Test   6 Minute Walk Post Test yes   BP (mmHg) 124/80   HR (bpm) 80   02 Sat (%RA) 99 %   Modified  Borg Scale for Dyspnea 3- Moderate shortness of breath or breathing difficulty   Perceived Rate of Exertion (Borg) 14-     6 minute walk test results    Aerobic Endurance Distance Walked 308   Endurance additional comments Ambulated with striaght cane; No rest breaks required; Pt demonstrates significantly decreased gait speed; Step-through pattern;Decreased stride length;Lateral hip instability;Trunk flexed;Wide base of support     Berg Balance Test   Sit to Stand Able to stand  independently using hands   Standing Unsupported Able to stand 2 minutes with supervision   Sitting with Back Unsupported but Feet Supported on Floor or Stool Able to sit safely and securely 2 minutes   Stand to Sit Controls descent by using hands   Transfers Able to transfer safely, definite need of hands   Standing Unsupported with Eyes Closed Able to stand 10 seconds safely   Standing Ubsupported with Feet Together Able to place feet together independently and stand for 1 minute with supervision  could not place feet completely together   From Standing, Reach Forward with Outstretched Arm Can reach forward >12 cm safely (5")  From Standing Position, Pick up Object from Floor Unable to try/needs assist to keep balance   From Standing Position, Turn to Look Behind Over each Shoulder Needs assist to keep from losing balance and falling  LOB when retruning to front after looking behind R shoulder   Turn 360 Degrees Needs close supervision or verbal cueing   Standing Unsupported, Alternately Place Feet on Step/Stool Needs assistance to keep from falling or unable to try   Standing Unsupported, One Foot in [place] to take small step independently and hold 30 seconds   Standing on One Leg Unable to try or needs assist to prevent fall   Total Score 29           PT Short Term Goals - 01/23/16 1525      PT SHORT TERM GOAL #1   Title Pt will initiate HEP in order to indicate improved functional mobility.   (Target Date: 02/15/16)   Baseline dependent   Time 4   Period Weeks     PT SHORT TERM GOAL #2   Title Will assess BERG balance test and improve score by 4 points in order to indicate decreased fall risk.     Baseline 01/22/16 Score: 29   Time 4   Period Weeks   Status New     PT SHORT TERM GOAL #3   Title Pt will improve gait speed to 1.58 ft/sec in order to indicate decreased fall risk and improved efficiency of gait.     Baseline .98 ft/sec   Time 4   Period Weeks   Status New     PT SHORT TERM GOAL #4   Title Will assess 6MWT and improve distance by 150' in order to indicate improved functional endurance.     Baseline 01/23/16 Pt ambulated 308 ft during 6MWT   Time 4   Period Weeks   Status New     PT SHORT TERM GOAL #5   Title Pt will ambulate 300' w/ LRAD at mod I level over indoor surfaces in order to indicate safe home negotiation.     Baseline 100' w/ SPC at min/guard level   Time 4   Period Weeks   Status New           PT Long Term Goals - 01/18/16 1633      PT LONG TERM GOAL #1   Title Pt will be independent with HEP in order to indicate improved functional mobility.  (Target Date: 03/14/16)   Baseline dependent   Time 8   Period Weeks   Status New     PT LONG TERM GOAL #2   Title Pt will improve BERG balance test score 8 points from baseline in order to indicate decreased fall risk.     Baseline unable to assess due to time constraint   Time 8   Period Weeks   Status New     PT LONG TERM GOAL #3   Title Pt will increase gait speed to 2.18 ft/sec w/ LRAD in order to indicate decreased fall risk and improved efficiency of gait.     Baseline .98 ft/sec   Time 8   Period Weeks   Status New     PT LONG TERM GOAL #4   Title Pt will verbalize return to community fitness program in order to indicate ongoing work on functional endurance.     Baseline currently not involved in program.    Time 8   Period Weeks  Status New     PT LONG TERM GOAL #5    Title Pt will ambulate 500' w/ LRAD at mod I level over unlevel paved surfaces (including ramp/curb) in order to indicate safe return to community.     Baseline min/guard 100' w/ SPC over indoor surfaces   Time 8   Period Weeks   Status New           Plan - 01/23/16 1518    Clinical Impression Statement 6 min walk test and berg balance test performed today to assess patient's baseline balance and endourance. Pt demonstrates significant fall risk per results of 6MWT and BERG and should benefit from continued PT to improve balance, strength, activity tolerance, and prevent future falls.   Rehab Potential Good   Clinical Impairments Affecting Rehab Potential possible visit limitations   PT Frequency 2x / week   PT Duration 8 weeks   PT Treatment/Interventions ADLs/Self Care Home Management;Electrical Stimulation;DME Instruction;Gait training;Stair training;Functional mobility training;Therapeutic activities;Neuromuscular re-education;Balance training;Therapeutic exercise;Patient/family education;Orthotic Fit/Training;Vestibular   PT Next Visit Plan initiate HEP for functional strength, balance, add walking program   Consulted and Agree with Plan of Care Patient;Family member/caregiver   Family Member Consulted husband      Patient will benefit from skilled therapeutic intervention in order to improve the following deficits and impairments:  Abnormal gait, Decreased activity tolerance, Decreased balance, Decreased mobility, Decreased strength, Dizziness, Impaired perceived functional ability, Impaired flexibility, Improper body mechanics, Postural dysfunction, Impaired UE functional use  Visit Diagnosis: 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 Obstructive sleep apnea 11/29/2015  . Right shoulder pain 11/29/2015  . Adjustment disorder with depressed mood 10/19/2015  . Nocturnal enuresis   . Hypokalemia   . Anemia, iron deficiency   . Super obese ([place])   . Prediabetes   . Orthostatic hypotension   . Supplemental oxygen dependent   . Diastolic dysfunction   . Tachypnea   . AKI (acute kidney injury) ([place])   . Acute blood loss anemia   . Lethargy   . Wheezing   . Benign essential HTN   . ICH (intracerebral hemorrhage) ([place]) 10/06/2015    Benjiman Core, SPTA 01/24/2016, 4:18 PM  [facility] [address] [place], [place], [address] Phone: [phone]   Fax:  [phone]  Name: [name] MRN: [id number] Date of Birth: 11-02-1960   Addendum: Agree with documentation above and attestation per [place], PTA.  Billie Ruddy, PT, DPT [facility] [address] [place] , [place], [address] Phone: [phone]   Fax:  [phone] 01/25/16, 7:58 AM

## 2016-01-26 ENCOUNTER — Telehealth: Payer: Self-pay | Admitting: Rehabilitation

## 2016-01-26 ENCOUNTER — Ambulatory Visit: Payer: Medicaid Other | Attending: Neurology | Admitting: Rehabilitation

## 2016-01-26 ENCOUNTER — Encounter: Payer: Self-pay | Admitting: Rehabilitation

## 2016-01-26 VITALS — BP 107/64

## 2016-01-26 DIAGNOSIS — R2681 Unsteadiness on feet: Secondary | ICD-10-CM | POA: Diagnosis present

## 2016-01-26 DIAGNOSIS — M25511 Pain in right shoulder: Secondary | ICD-10-CM | POA: Diagnosis present

## 2016-01-26 DIAGNOSIS — R278 Other lack of coordination: Secondary | ICD-10-CM | POA: Diagnosis present

## 2016-01-26 DIAGNOSIS — R471 Dysarthria and anarthria: Secondary | ICD-10-CM | POA: Insufficient documentation

## 2016-01-26 DIAGNOSIS — R41842 Visuospatial deficit: Secondary | ICD-10-CM | POA: Insufficient documentation

## 2016-01-26 DIAGNOSIS — M6281 Muscle weakness (generalized): Secondary | ICD-10-CM | POA: Insufficient documentation

## 2016-01-26 DIAGNOSIS — I69115 Cognitive social or emotional deficit following nontraumatic intracerebral hemorrhage: Secondary | ICD-10-CM | POA: Diagnosis present

## 2016-01-26 DIAGNOSIS — R41841 Cognitive communication deficit: Secondary | ICD-10-CM | POA: Insufficient documentation

## 2016-01-26 DIAGNOSIS — I69253 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 non-dominant side: Secondary | ICD-10-CM | POA: Insufficient documentation

## 2016-01-26 DIAGNOSIS — R4701 Aphasia: Secondary | ICD-10-CM | POA: Diagnosis present

## 2016-01-26 DIAGNOSIS — R2689 Other abnormalities of gait and mobility: Secondary | ICD-10-CM | POA: Diagnosis present

## 2016-01-26 NOTE — Telephone Encounter (Signed)
I will let her know.    Thanks so much! Cameron Sprang, PT, MPT [facility] [address] [place], [place], [address] Phone: [phone]   Fax:  [phone] 01/26/16, 4:43 PM

## 2016-01-26 NOTE — Telephone Encounter (Signed)
Dr. Erlinda Hong,   I am working with [facility] for OP neuro PT.  Note that she continues to have increased pain in R shoulder, 10/10 pain at times that is limiting mobility and causing decreased sleep at night.  Note in chart that she was looking to get referral to Dr. Letta Pate at [facility].  I just wanted to follow up with you and see if she could possibly get referral to this clinic for shoulder pain management.    Thanks,  Cameron Sprang, PT, MPT [facility] [address] [place], [place], [address] Phone: [phone]   Fax:  [phone] 01/26/16, 12:03 PM

## 2016-01-26 NOTE — Therapy (Signed)
[facility] [address] [place], [place], [address] Phone: [phone]   Fax:  [phone]  Physical Therapy Treatment  Patient Details  Name: [name] MRN: [id number] Date of Birth: 1960/11/16 Referring Provider: Rosalin Hawking, MD  Encounter Date: 01/26/2016      PT End of Session - 01/26/16 0857    Visit Number 3   Number of Visits 17   Date for PT Re-Evaluation 03/18/16   Authorization Type Possible MCD-waiting to get number from pt (understands that she is self pay until then)   PT Start Time 0846   PT Stop Time 0930   PT Time Calculation (min) 44 min   Equipment Utilized During Treatment Gait belt   Activity Tolerance Patient tolerated treatment well   Behavior During Therapy [facility] for tasks assessed/performed      Past Medical History:  Diagnosis Date  . Headache   . Hypertension   . Iron deficiency anemia   . Seizures ([place])   . Stroke [facility])     Past Surgical History:  Procedure Laterality Date  . CARDIAC CATHETERIZATION  04/2010   Dr. Terrence Dupont    Vitals:   01/26/16 1154  BP: 107/64        Subjective Assessment - 01/26/16 0850    Subjective Reports increased pain today in R shoulder.    Patient is accompained by: Family member   Limitations House hold activities;Walking   Currently in Pain? Yes   Pain Score 9    Pain Location Shoulder   Pain Orientation Right   Pain Descriptors / Indicators Throbbing   Pain Type Chronic pain   Pain Onset More than a month ago   Pain Frequency Constant   Aggravating Factors  sleeping on it   Pain Relieving Factors took OTC meds this morning.             TE:  Initiated HEP for BLE strengthening, see HEP.  Also discussed adding walking program to HEP to address endurance/poor activity tolerance.  See pt instruction for details.    Self Care:  Discussed shoulder pain and PT to send message to Dr. Erlinda Hong regarding referral to OP pain clinic regarding  pain.  Also went over sleeping positions to alleviate pain provide proper positioning for RUE and also note pt with feeling of dizziness during session.  BP WFL (low for pt) however she felt better with continued standing and activity.  Education to monitor at home and notify MD if low/high with symptoms.  Pt verbalized understanding.                       PT Education - 01/26/16 0857    Education provided Yes   Education Details HEP, see pt instruction    Person(s) Educated Patient   Methods Explanation   Comprehension Verbalized understanding          PT Short Term Goals - 01/23/16 1525      PT SHORT TERM GOAL #1   Title Pt will initiate HEP in order to indicate improved functional mobility.  (Target Date: 02/15/16)   Baseline dependent   Time 4   Period Weeks     PT SHORT TERM GOAL #2   Title Will assess BERG balance test and improve score by 4 points in order to indicate decreased fall risk.     Baseline 01/22/16 Score: 29   Time 4   Period Weeks   Status New  PT SHORT TERM GOAL #3   Title Pt will improve gait speed to 1.58 ft/sec in order to indicate decreased fall risk and improved efficiency of gait.     Baseline .98 ft/sec   Time 4   Period Weeks   Status New     PT SHORT TERM GOAL #4   Title Will assess 6MWT and improve distance by 150' in order to indicate improved functional endurance.     Baseline 01/23/16 Pt ambulated 308 ft during 6MWT   Time 4   Period Weeks   Status New     PT SHORT TERM GOAL #5   Title Pt will ambulate 300' w/ LRAD at mod I level over indoor surfaces in order to indicate safe home negotiation.     Baseline 100' w/ SPC at min/guard level   Time 4   Period Weeks   Status New           PT Long Term Goals - 01/18/16 1633      PT LONG TERM GOAL #1   Title Pt will be independent with HEP in order to indicate improved functional mobility.  (Target Date: 03/14/16)   Baseline dependent   Time 8   Period Weeks    Status New     PT LONG TERM GOAL #2   Title Pt will improve BERG balance test score 8 points from baseline in order to indicate decreased fall risk.     Baseline unable to assess due to time constraint   Time 8   Period Weeks   Status New     PT LONG TERM GOAL #3   Title Pt will increase gait speed to 2.18 ft/sec w/ LRAD in order to indicate decreased fall risk and improved efficiency of gait.     Baseline .98 ft/sec   Time 8   Period Weeks   Status New     PT LONG TERM GOAL #4   Title Pt will verbalize return to community fitness program in order to indicate ongoing work on functional endurance.     Baseline currently not involved in program.    Time 8   Period Weeks   Status New     PT LONG TERM GOAL #5   Title Pt will ambulate 500' w/ LRAD at mod I level over unlevel paved surfaces (including ramp/curb) in order to indicate safe return to community.     Baseline min/guard 100' w/ SPC over indoor surfaces   Time 8   Period Weeks   Status New               Plan - 01/26/16 [id number]    Clinical Impression Statement Skilled session focused on initiation of HEP for BLE strengthening, discussion of walking program for endurance.  Note that she continues to have some dizziness during session.  BP was 107/64 during session.  Unable to obtain a standing pressure, however pt with no symptoms once in standing.     Rehab Potential Good   Clinical Impairments Affecting Rehab Potential possible visit limitations   PT Frequency 2x / week   PT Duration 8 weeks   PT Treatment/Interventions ADLs/Self Care Home Management;Electrical Stimulation;DME Instruction;Gait training;Stair training;Functional mobility training;Therapeutic activities;Neuromuscular re-education;Balance training;Therapeutic exercise;Patient/family education;Orthotic Fit/Training;Vestibular   PT Next Visit Plan Add balance to HEP as able, work on gait speed, endurance.    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 Obstructive sleep apnea 11/29/2015  . Right shoulder pain 11/29/2015  . Adjustment disorder with depressed mood 10/19/2015  . Nocturnal enuresis   . Hypokalemia   . Anemia, iron deficiency   . Super obese ([place])   . Prediabetes   . Orthostatic hypotension   . Supplemental oxygen dependent   . Diastolic dysfunction   . Tachypnea   . AKI (acute kidney injury) ([place])   . Acute blood loss anemia   . Lethargy   . Wheezing   . Benign essential HTN   . ICH (intracerebral hemorrhage) ([place]) 10/06/2015   Cameron Sprang, PT, MPT [facility] [address] [place], [place], [address] Phone: [phone]   Fax:  [phone] 01/26/16, 12:00 PM  Name: [name] MRN: [id number] Date of Birth: 08-24-1960

## 2016-01-26 NOTE — Patient Instructions (Addendum)
Functional Quadriceps: Sit to Stand    Sit on edge of chair, feet flat on floor. Stand upright, extending knees fully. (Do this from your shower seat so that you are close enough to the ground).  Do without your hands!! Repeat __10__ times per set. Do __1__ sets per session. Do _2___ sessions per day.  http://orth.exer.us/735   Copyright  VHI. All rights reserved.    Adduction: Hip - Knees Together (Hook-Lying)    Lie with hips and knees bent, towel roll between knees. Push knees together. Hold for __5_ seconds. Rest for _5__ seconds. Repeat _10_ times. Do _2__ times a day.   Copyright  VHI. All rights reserved.   Bracing With Bridging (Hook-Lying)    With neutral spine, tighten pelvic floor and abdominals and hold. Lift bottom. Repeat _10__ times. Do _2__ times a day.  Don't forget to breath!!  Copyright  VHI. All rights reserved.    Walking Program:    Start with 6 minutes and walk a path in your home for this amount of time.  Make sure you time yourself.  Do this twice a day for a week.  If at the end of the week, 6 mins is easier, bump up to 7 mins the next week and so fourth and so on to show that you are progressing.    ABDUCTION: Standing (Active)    Stand facing counter top for support, feet flat. Lift right leg out to side. No weight on ankle.   Complete _1__ sets of __10_ repetitions on each side. Perform _2__ sessions per day.  http://gtsc.exer.us/111   Copyright  VHI. All rights reserved.

## 2016-01-26 NOTE — Telephone Encounter (Signed)
I referred her to Dr. Letta Pate back to 11/2015. I tracked through this referral, seems like they did not schedule her with Dr. Letta Pate because she has already established with [name] in the same office before. So she does not need any referral to see PMR physicians and she just need to call [name] office and make an appointment for follow up. [name] will take care of the shoulder pain. Could you please let her know? Thanks a lot.  Rosalin Hawking, MD PhD Stroke Neurology 01/26/2016 2:01 PM

## 2016-01-29 ENCOUNTER — Ambulatory Visit: Payer: Medicaid Other | Admitting: Rehabilitation

## 2016-01-29 ENCOUNTER — Ambulatory Visit: Payer: Medicaid Other | Admitting: *Deleted

## 2016-01-29 ENCOUNTER — Encounter: Payer: Self-pay | Admitting: Rehabilitation

## 2016-01-29 DIAGNOSIS — I69253 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 non-dominant side: Secondary | ICD-10-CM

## 2016-01-29 DIAGNOSIS — R2689 Other abnormalities of gait and mobility: Secondary | ICD-10-CM

## 2016-01-29 DIAGNOSIS — M6281 Muscle weakness (generalized): Secondary | ICD-10-CM

## 2016-01-29 DIAGNOSIS — I69115 Cognitive social or emotional deficit following nontraumatic intracerebral hemorrhage: Secondary | ICD-10-CM

## 2016-01-29 DIAGNOSIS — R2681 Unsteadiness on feet: Secondary | ICD-10-CM

## 2016-01-29 DIAGNOSIS — R4701 Aphasia: Secondary | ICD-10-CM

## 2016-01-29 NOTE — Therapy (Signed)
[facility] [address] [place], [place], [address] Phone: [phone]   Fax:  [phone]  Speech Language Pathology Treatment  Patient Details  Name: [name] MRN: [id number] Date of Birth: [birth year] Referring Provider: Debbe Odea MD  Encounter Date: 01/29/2016      End of Session - 01/29/16 1116    Visit Number 2   Number of Visits 17   Date for SLP Re-Evaluation 04/05/16   SLP Start Time 0845   SLP Stop Time  0930   SLP Time Calculation (min) 45 min   Activity Tolerance Patient tolerated treatment well      Past Medical History:  Diagnosis Date  . Headache   . Hypertension   . Iron deficiency anemia   . Seizures ([place])   . Stroke [facility])     Past Surgical History:  Procedure Laterality Date  . CARDIAC CATHETERIZATION  04/2010   Dr. Terrence Dupont    There were no vitals filed for this visit.      Subjective Assessment - 01/29/16 0918    Subjective "I'm doing good."   Patient is accompained by: Family member  spouse               ADULT SLP TREATMENT - 01/29/16 0920      General Information   Behavior/Cognition Alert;Cooperative;Pleasant mood   Patient Positioning Upright in chair     Treatment Provided   Treatment provided Cognitive-Linquistic     Pain Assessment   Pain Assessment 0-10   Pain Score 4    Pain Location R shoulder      Cognitive-Linquistic Treatment   Treatment focused on Cognition;Aphasia   Skilled Treatment Pt seen for skilled SLP treatment with focus on word-finding and functional reasoning/organization per pt request. Pt required intermittent min A to organize information appropriately to allow for success with functional math word problems. Min verbal cueing to encourage use of compensatory strategies of description during episodes of word-finding difficulty.      Assessment / Recommendations / Plan   Plan Continue with current plan of care;Goals updated      Progression Toward Goals   Progression toward goals Progressing toward goals            SLP Short Term Goals - 01/29/16 1119      SLP SHORT TERM GOAL #2   Title pt will name 8-10 items in mod complex category with modified independence over three sessions   Baseline 11/6   Status On-going     SLP SHORT TERM GOAL #4   Title Pt to complete moderately complex reasoning tasks at mod I level and >80% acc over 2 sessions.   Time 4   Period Weeks   Status New          SLP Long Term Goals - 01/18/16 1204      SLP LONG TERM GOAL #1   Title pt will demo WFL 10 minutes mod complex conversation with modified independence (using compensations) in three sessions   Time 8   Period Weeks   Status New     SLP LONG TERM GOAL #2   Title pt will name at least average of 6 items in complex/abstract category over three sessions   Time 8   Period Weeks   Status New     SLP LONG TERM GOAL #3   Title pt will incr speech volume in 10 minutes mod complex conversation to average 70dB over three sessions  Time 8   Period Weeks   Status New          Plan - 01/29/16 1117    Clinical Impression Statement Pt requested to work on cognitive limitations this date so she can contribute to the household functioning (as well as speech). Will add cognitive goals to maximize pt's functional independence.    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 Obstructive sleep apnea 11/29/2015  . Right shoulder pain 11/29/2015  . Adjustment disorder with depressed mood 10/19/2015  . Nocturnal enuresis   . Hypokalemia   . Anemia, iron deficiency   . Super obese ([place])   . Prediabetes   . Orthostatic hypotension   . Supplemental oxygen dependent   . Diastolic dysfunction   . Tachypnea   . AKI (acute kidney injury) ([place])   . Acute blood loss anemia   . Lethargy   . Wheezing   . Benign essential HTN   . ICH (intracerebral hemorrhage) ([place]) 10/06/2015    Vinetta Bergamo MA, CCC-SLP 01/29/2016, [facility] [address] [place], [place], [address] Phone: [phone]   Fax:  [phone]   Name: [name] MRN: [id number] Date of Birth: [birth year]

## 2016-01-29 NOTE — Therapy (Signed)
[facility] [address] [place], [place], [address] Phone: [phone]   Fax:  [phone]  Physical Therapy Treatment  Patient Details  Name: BRITTANNIE SZAFRANSKI MRN: [id number] Date of Birth: [birth year] Referring Provider: Rosalin Hawking, MD  Encounter Date: 01/29/2016      PT End of Session - 01/29/16 0808    Visit Number 4   Number of Visits 17   Date for PT Re-Evaluation 03/18/16   Authorization Type Possible MCD-waiting to get number from pt (understands that she is self pay until then)   PT Start Time 0805  Pt late to appt   PT Stop Time 0845   PT Time Calculation (min) 40 min   Equipment Utilized During Treatment Gait belt   Activity Tolerance Patient tolerated treatment well   Behavior During Therapy [facility] for tasks assessed/performed      Past Medical History:  Diagnosis Date  . Headache   . Hypertension   . Iron deficiency anemia   . Seizures ([place])   . Stroke [facility])     Past Surgical History:  Procedure Laterality Date  . CARDIAC CATHETERIZATION  04/2010   Dr. Terrence Dupont    There were no vitals filed for this visit.      Subjective Assessment - 01/29/16 0806    Subjective "I had a busy weekend, I went to church and to my sister's house."    Patient is accompained by: Family member   Limitations House hold activities;Walking   Currently in Pain? Yes   Pain Score 5    Pain Location Shoulder   Pain Orientation Right   Pain Descriptors / Indicators Throbbing   Pain Type Chronic pain   Pain Radiating Towards down to forearm   Pain Onset More than a month ago   Pain Frequency Constant   Aggravating Factors  sleeping on it   Pain Relieving Factors OTC meds                         [facility] Adult PT Treatment/Exercise - 01/29/16 0001      Transfers   Transfers Sit to Stand;Stand to Sit   Sit to Stand 6: Modified independent (Device/Increase time)   Stand to Sit 6: Modified independent  (Device/Increase time)     Ambulation/Gait   Ambulation/Gait Yes   Ambulation/Gait Assistance 4: Min guard;4: Min assist  to facilitate increased gait speed   Ambulation/Gait Assistance Details Gait training during session with emphasis on increased gait speed.  Iniitally performed with R HHA with PT manually facilitating gait speed with cues for increased RLE activation when in stance and also for increased L step length.  Performed 230' in this manner.  Then switched to rollator in order to continue to work on gait speed as PT felt this device safe for pt and also unstable enough to increase gait speed.  Pt did very well x 230' at S to min/guard level (again PT intermittently assisting for increased gait speed).  Discussed benefits of this walker for gait speed and also for endurance deficits at this time, however PT hesitant to recommend as this would be out of pocket cost and would like to continue to work on endurance and balance in therapy.     Assistive device 4-wheeled walker;1 person hand held assist   Gait Pattern Step-through pattern;Decreased stride length;Lateral hip instability;Trunk flexed;Wide base of support   Ambulation Surface Level;Indoor   Gait Comments seated rest break in  between gait trials with pt reporting feeling dizzy.  BP was 123/79, therefore allowed rest break and provided education on importance of breath control throughout mobility tasks.       High Level Balance   High Level Balance Activities Marching forwards;Marching backwards;Tandem walking;Other (comment)   High Level Balance Comments Performed forwards/backwards tandem walking x 2 reps along counter top, marching forwards/backwards x 2 reps along counter top.  Provided these for HEP to work on balance at home.  Pt verbalized understanding.  Cues for RLE activation during R stance.                  PT Education - 01/29/16 0808    Education provided Yes   Education Details additions to HEP, see pt  instruction   Person(s) Educated Patient;Spouse   Methods Explanation;Demonstration;Handout   Comprehension Verbalized understanding;Returned demonstration          PT Short Term Goals - 01/23/16 1525      PT SHORT TERM GOAL #1   Title Pt will initiate HEP in order to indicate improved functional mobility.  (Target Date: 02/15/16)   Baseline dependent   Time 4   Period Weeks     PT SHORT TERM GOAL #2   Title Will assess BERG balance test and improve score by 4 points in order to indicate decreased fall risk.     Baseline 01/22/16 Score: 29   Time 4   Period Weeks   Status New     PT SHORT TERM GOAL #3   Title Pt will improve gait speed to 1.58 ft/sec in order to indicate decreased fall risk and improved efficiency of gait.     Baseline .98 ft/sec   Time 4   Period Weeks   Status New     PT SHORT TERM GOAL #4   Title Will assess 6MWT and improve distance by 150' in order to indicate improved functional endurance.     Baseline 01/23/16 Pt ambulated 308 ft during 6MWT   Time 4   Period Weeks   Status New     PT SHORT TERM GOAL #5   Title Pt will ambulate 300' w/ LRAD at mod I level over indoor surfaces in order to indicate safe home negotiation.     Baseline 100' w/ SPC at min/guard level   Time 4   Period Weeks   Status New           PT Long Term Goals - 01/18/16 1633      PT LONG TERM GOAL #1   Title Pt will be independent with HEP in order to indicate improved functional mobility.  (Target Date: 03/14/16)   Baseline dependent   Time 8   Period Weeks   Status New     PT LONG TERM GOAL #2   Title Pt will improve BERG balance test score 8 points from baseline in order to indicate decreased fall risk.     Baseline unable to assess due to time constraint   Time 8   Period Weeks   Status New     PT LONG TERM GOAL #3   Title Pt will increase gait speed to 2.18 ft/sec w/ LRAD in order to indicate decreased fall risk and improved efficiency of gait.      Baseline .98 ft/sec   Time 8   Period Weeks   Status New     PT LONG TERM GOAL #4   Title Pt will verbalize return to community  fitness program in order to indicate ongoing work on functional endurance.     Baseline currently not involved in program.    Time 8   Period Weeks   Status New     PT LONG TERM GOAL #5   Title Pt will ambulate 500' w/ LRAD at mod I level over unlevel paved surfaces (including ramp/curb) in order to indicate safe return to community.     Baseline min/guard 100' w/ SPC over indoor surfaces   Time 8   Period Weeks   Status New               Plan - 01/29/16 1238    Clinical Impression Statement Skilled session focused on addition of balance exercises to HEP and also gait training for improved gait speed and gait mechanics.  Tolerated well with some dizziness, but BP WFL.     Rehab Potential Good   Clinical Impairments Affecting Rehab Potential possible visit limitations   PT Frequency 2x / week   PT Duration 8 weeks   PT Treatment/Interventions ADLs/Self Care Home Management;Electrical Stimulation;DME Instruction;Gait training;Stair training;Functional mobility training;Therapeutic activities;Neuromuscular re-education;Balance training;Therapeutic exercise;Patient/family education;Orthotic Fit/Training;Vestibular   PT Next Visit Plan Add balance to HEP as able, work on gait speed, endurance.    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 Obstructive sleep apnea 11/29/2015  . Right shoulder pain 11/29/2015  . Adjustment disorder with depressed mood 10/19/2015  . Nocturnal enuresis   . Hypokalemia   . Anemia, iron deficiency   . Super obese ([place])   . Prediabetes   . Orthostatic hypotension   . Supplemental oxygen dependent   . Diastolic dysfunction   . Tachypnea   . AKI (acute kidney injury) ([place])   . Acute blood loss anemia   . Lethargy   . Wheezing   . Benign essential HTN   . ICH (intracerebral hemorrhage) ([place]) 10/06/2015    Cameron Sprang, PT, MPT [facility] [address] [place], [place], [address] Phone: [phone]   Fax:  [phone] 01/29/16, 12:40 PM  Name: KYNDALL DROWN MRN: [id number] Date of Birth: 26-Aug-1960

## 2016-01-29 NOTE — Patient Instructions (Signed)
Tandem Walking      Copyright  VHI. All rights reserved.  Walk with each foot directly in front of other, heel of one foot touching toes of other foot with each step. Both feet straight ahead. Then perform backwards along counter top x 4 reps (down and back is one rep).     Stand at counter top"[employer] love a [employer] along counter top.  Using a chair if necessary, march forwards down counter top and then backwards with hand on counter top.   Repeat _4___ times. Do __1-2__ sessions per day.  http://gt2.exer.us/345    SINGLE LIMB STANCE    Stand facing counter top for support.  Stance: single leg on floor. Raise leg. Hold _15__ seconds. Repeat with other leg. _3__ reps per set, _1-2__ sets per day, _5-7__ days per week  Copyright  VHI. All rights reserved.

## 2016-01-30 ENCOUNTER — Ambulatory Visit: Payer: Medicaid Other | Admitting: *Deleted

## 2016-01-30 DIAGNOSIS — R4701 Aphasia: Secondary | ICD-10-CM

## 2016-01-30 DIAGNOSIS — I69115 Cognitive social or emotional deficit following nontraumatic intracerebral hemorrhage: Secondary | ICD-10-CM

## 2016-01-30 DIAGNOSIS — I69253 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 non-dominant side: Secondary | ICD-10-CM | POA: Diagnosis not present

## 2016-01-30 NOTE — Therapy (Signed)
[facility] [address] [place], [place], [address] Phone: [phone]   Fax:  [phone]  Speech Language Pathology Treatment  Patient Details  Name: [name] MRN: [id number] Date of Birth: 1960/05/16 Referring Provider: Debbe Odea MD  Encounter Date: 01/30/2016      End of Session - 01/30/16 1256    Visit Number 3   Number of Visits 17   Date for SLP Re-Evaluation 04/05/16   SLP Start Time [id number]   SLP Stop Time  1240   SLP Time Calculation (min) 45 min   Activity Tolerance Patient tolerated treatment well      Past Medical History:  Diagnosis Date  . Headache   . Hypertension   . Iron deficiency anemia   . Seizures ([place])   . Stroke [facility])     Past Surgical History:  Procedure Laterality Date  . CARDIAC CATHETERIZATION  04/2010   Dr. Terrence Dupont    There were no vitals filed for this visit.      Subjective Assessment - 01/30/16 1246    Subjective He tells me my voice fades away   Patient is accompained by: Family member  husband [name]   Currently in Pain? Yes   Pain Score 5    Pain Location Shoulder   Pain Orientation Right   Pain Descriptors / Indicators Aching   Pain Type Chronic pain               ADULT SLP TREATMENT - 01/30/16 0001      General Information   Behavior/Cognition Alert;Cooperative;Pleasant mood   Patient Positioning Upright in chair     Treatment Provided   Treatment provided Cognitive-Linquistic     Pain Assessment   Pain Assessment 0-10   Pain Score 5    Pain Location right shoulder     Cognitive-Linquistic Treatment   Treatment focused on Cognition;Aphasia;Patient/family/caregiver education   Skilled Treatment Pt seen for skilled ST therapy focusing on established goals including increased vocal intensity, word finding, and effective communication. 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 During 15 minute conversation, pt effectively communicated this information without obvious issue with word finding. She indicated later that there were 2 occasions where she couldn't think of the word she wanted to use, but substituted it for another.. Pt and her husband indicated pt has a tendency to "fade away" regarding vocal intensity. SLP reviewed and provided written strategies and exercises to facilitate diaphragmatic breathing, and awareness of fatigue.     Assessment / Recommendations / Plan   Plan Continue with current plan of care     Progression Toward Goals   Progression toward goals Progressing toward goals          SLP Education - 01/30/16 1255    Education provided Yes   Education Details diaphragmatic breathing exercises, increasing awareness of breathing, loudness, fatigue   Person(s) Educated Patient;Spouse   Methods Explanation;Demonstration;Handout   Comprehension Verbalized understanding          SLP Short Term Goals - 01/30/16 1259      SLP SHORT TERM GOAL #1   Title pt will incr speech volume to average 69dB for 18/20 sentence responses over three sessions   Baseline adequate vocal intensity for quiet environment   Time 3   Status On-going     SLP  SHORT TERM GOAL #2   Title pt will name 8-10 items in mod complex category with modified independence over three sessions   Time 3   Period Weeks   Status On-going     SLP SHORT TERM GOAL #3   Title pt will demo [facility] 5 minutes mod-complex conversation with rare min A over two sessions   Baseline 15 minute conversation with no cues 01/30/16   Time 3   Period Weeks   Status On-going     SLP SHORT TERM GOAL #4   Title Pt to complete moderately complex reasoning tasks at mod I level and >80% acc over 2 sessions.   Time 4   Period Weeks   Status New          SLP Long Term Goals - 01/30/16 1301      SLP LONG TERM GOAL #1   Title pt  will demo WFL 10 minutes mod complex conversation with modified independence (using compensations) in three sessions   Baseline 15 minute conversation wtih independent use of strategies to compensate for word finding errors   Time 7   Period Weeks   Status On-going     SLP LONG TERM GOAL #2   Title pt will name at least average of 6 items in complex/abstract category over three sessions   Time 7   Period Weeks   Status On-going     SLP LONG TERM GOAL #3   Title pt will incr speech volume in 10 minutes mod complex conversation to average 70dB over three sessions   Baseline 15 minute conversation with adequate vocal intensity in quiet environment.   Time 7   Period Weeks   Status On-going          Plan - 01/30/16 1256    Clinical Impression Statement Pt is making progress toward short and long term goals, with functional and effective communication for 15 minutes with minimal word finding issues and adequate volume for quiet environment. Written information was provided on increasing breath support with diaphragmatic breathing exercises. Pt is motivated to improve and return to prior level of function.   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 Obstructive sleep apnea 11/29/2015  . Right  shoulder pain 11/29/2015  . Adjustment disorder with depressed mood 10/19/2015  . Nocturnal enuresis   . Hypokalemia   . Anemia, iron deficiency   . Super obese ([place])   . Prediabetes   . Orthostatic hypotension   . Supplemental oxygen dependent   . Diastolic dysfunction   . Tachypnea   . AKI (acute kidney injury) ([place])   . Acute blood loss anemia   . Lethargy   . Wheezing   . Benign essential HTN   . ICH (intracerebral hemorrhage) ([place]) 10/06/2015  Quandarius Nill B. [place], MSP, CCC-SLP  Shonna Chock 01/30/2016, 1:04 PM  [facility] [address] [place], [place], [address] Phone:  [phone]   Fax:  [phone]   Name: [name] MRN: [id number] Date of Birth: [birth year]

## 2016-01-30 NOTE — Patient Instructions (Signed)
Ways to help "fading away" of your voice strength  1. Diaphragmatic breathing rather than clavicular (shallow) breathing - do exercises 2-3x/day  2. Focus on taking a deep breath when talking with someone  3. Practice talking on "fumes", where you keep talking even thought you really need to take a breath. Just to see what it feels like.  4. Call to [facility] from another room, using deep breathing techniques first.   5. Try to pay attention to how it feels when you are:    Loud vs Soft  Have enough air vs running out  Deep breathing vs shallow breathing  6. Breathe in through the nose, out through the mouth - if possible  7. Try to find 10-15 minutes a day where you are focusing on relaxation/breathing

## 2016-02-02 ENCOUNTER — Ambulatory Visit (HOSPITAL_COMMUNITY)
Admission: RE | Admit: 2016-02-02 | Discharge: 2016-02-02 | Disposition: A | Discharge status: Home / Self Care | Payer: Medicaid Other | Source: Ambulatory Visit | Attending: Physical Medicine & Rehabilitation | Admitting: Physical Medicine & Rehabilitation

## 2016-02-02 ENCOUNTER — Encounter: Payer: Medicaid Other | Attending: Physical Medicine & Rehabilitation | Admitting: Physical Medicine & Rehabilitation

## 2016-02-02 ENCOUNTER — Encounter: Payer: Self-pay | Admitting: Physical Medicine & Rehabilitation

## 2016-02-02 VITALS — BP 115/76 | HR 75

## 2016-02-02 DIAGNOSIS — M25511 Pain in right shoulder: Secondary | ICD-10-CM

## 2016-02-02 DIAGNOSIS — R569 Unspecified convulsions: Secondary | ICD-10-CM | POA: Insufficient documentation

## 2016-02-02 DIAGNOSIS — M62838 Other muscle spasm: Secondary | ICD-10-CM | POA: Diagnosis not present

## 2016-02-02 DIAGNOSIS — F329 Major depressive disorder, single episode, unspecified: Secondary | ICD-10-CM

## 2016-02-02 DIAGNOSIS — I69249 Monoplegia of lower limb following other nontraumatic intracranial hemorrhage affecting unspecified side: Secondary | ICD-10-CM | POA: Insufficient documentation

## 2016-02-02 DIAGNOSIS — I69398 Other sequelae of cerebral infarction: Secondary | ICD-10-CM

## 2016-02-02 DIAGNOSIS — R269 Unspecified abnormalities of gait and mobility: Secondary | ICD-10-CM | POA: Diagnosis not present

## 2016-02-02 DIAGNOSIS — R7303 Prediabetes: Secondary | ICD-10-CM | POA: Diagnosis not present

## 2016-02-02 DIAGNOSIS — I611 Nontraumatic intracerebral hemorrhage in hemisphere, cortical: Secondary | ICD-10-CM

## 2016-02-02 DIAGNOSIS — I10 Essential (primary) hypertension: Secondary | ICD-10-CM

## 2016-02-02 DIAGNOSIS — I629 Nontraumatic intracranial hemorrhage, unspecified: Secondary | ICD-10-CM | POA: Insufficient documentation

## 2016-02-02 DIAGNOSIS — Z5189 Encounter for other specified aftercare: Secondary | ICD-10-CM | POA: Insufficient documentation

## 2016-02-02 MED ORDER — DICLOFENAC SODIUM 1 % TD GEL: 2.0000 g | Freq: Four times a day (QID) | TRANSDERMAL | 1 refills | Status: DC

## 2016-02-02 MED FILL — DICLOFENAC SODIUM 1% GEL: 1 | 12 days supply | Qty: 100 | Fill #0

## 2016-02-02 NOTE — Progress Notes (Signed)
Subjective:    Patient ID: [name], female    DOB: 01-30-1961, 55 y.o.   MRN: [id number]  HPI  55 year old RH-female with history of morbid obesity, HTN, iron deficiency anemia who presents for follow up after right temporal intracranial hemorrhage.  Last clinic visit 01/20/16.  She saw Neurology and a repeat MRI was performed, which showed persistent bleed, improving. Her muscle spasms have improved. She is still in therapies, but OT was placed on hold due to edema in her right arm.  She is managing her depressive symptoms.  No repeat seizures. Continues to try to lose weight.  She has graduated to using a cane for ambulation from a walker.    Pain Inventory Average Pain 5 Pain Right Now 8 My pain is sharp, dull, stabbing, tingling and aching  In the last 24 hours, has pain interfered with the following? General activity 7 Relation with others 5 Enjoyment of life 7 What TIME of day is your pain at its worst? morning, night Sleep (in general) Fair  Pain is worse with: inactivity and some activites Pain improves with: heat/ice and medication Relief from Meds: 0  Mobility walk with assistance use a cane how many minutes can you walk? 10 do you drive?  no Do you have any goals in this area?  yes  Function not employed: date last employed 10-06-15 I need assistance with the following:  meal prep, household duties and shopping Do you have any goals in this area?  yes  Neuro/Psych weakness spasms dizziness  Prior Studies Any changes since last visit?  yes  Physicians involved in your care na   Family History  Problem Relation Age of Onset  . Diabetes Father   . Hypertension Father   . Hypertension Sister   . Stroke Brother    Social History   Social History  . Marital status: Married    Spouse name: N/A  . Number of children: N/A  . Years of education: N/A   Social History Main Topics  . Smoking status: Never Smoker  . Smokeless tobacco: Never Used    . Alcohol use No  . Drug use: No  . Sexual activity: Not Asked   Other Topics Concern  . None   Social History Narrative  . None   Past Surgical History:  Procedure Laterality Date  . CARDIAC CATHETERIZATION  04/2010   Dr. Terrence Dupont   Past Medical History:  Diagnosis Date  . Headache   . Hypertension   . Iron deficiency anemia   . Seizures ([place])   . Stroke ([place])    BP 115/76   Pulse 75   LMP 02/17/2011   SpO2 98%   Opioid Risk Score:   Fall Risk Score:  `1  Depression screen PHQ 2/9  Depression screen [facility] 2/9 11/07/2015 11/02/2015 10/23/2015  Decreased Interest (No Data) 3 3  Down, Depressed, Hopeless (No Data) 1 3  PHQ - 2 Score - 4 6  Altered sleeping 2 3 3   Tired, decreased energy 3 3 3   Change in appetite 2 1 3   Feeling bad or failure about yourself  0 0 3  Trouble concentrating 2 3 3   Moving slowly or fidgety/restless 0 3 3  Suicidal thoughts 0 0 3  PHQ-9 Score - 17 27  Difficult doing work/chores - Extremely dIfficult -    Review of Systems  Constitutional:       Bladder control problems   HENT: Negative.   Eyes:  Negative.   Respiratory: Negative.   Cardiovascular: Negative.   Gastrointestinal: Negative.   Endocrine: Negative.   Genitourinary: Negative.   Musculoskeletal: Negative.   Skin: Negative.   Neurological: Positive for dizziness and weakness.       Tingling Spasms    Hematological: Negative.   Psychiatric/Behavioral: Negative.   All other systems reviewed and are negative.      Objective:   Physical Exam Constitutional: She appears well-developed and well-nourished. NAD. Morbidly obese.  HENT: Normocephalic and atraumatic.  Eyes: EOMI. No discharge.  Cardiovascular: RRR. No JVD. Respiratory: Effort normal and breath sounds normal. No stridor. No respiratory distress.  GI: Soft. Bowel sounds are normal. She exhibits no distension. There is no tenderness.  Musculoskeletal:  Edema difficult to assess in RUE due to habitus TTP  right shoulder > AC joint. Neurological: She is alert and oriented.  Motor: RUE: limited due to pain, otherwise 5/5 throughout. Skin: Skin is warm and dry.  Psychiatric: She has a normal mood and affect. Her behavior is normal. Thought content normal.     Assessment & Plan:  55 year old RH-female with history of morbid obesity, HTN, iron deficiency anemia who presents for follow up after right temporal intracranial hemorrhage.  1.  S/p right temporal intracranial hemorrhage  Cont therapies  Cont to follow with Neurology  Cont meds   2. Depression   Pt states she is managing with her chaplain and scriptures  Does not want medications at present, states she does better when she is able to go to church.  3. HTN  Cont meds  Under control at present  4. New onset seizures  Cont meds  5. Prediabetes  Cont diet and lifestyle modification  States she continues to eat well  6. Morbid obesity  Cont to encourage weight loss  7. Muscle spasms  Pt refusing medications, but willing to try if necessary.  Will consider Robaxin in future  Encouraged ROM and stretching  8. Abnormality of gait - post stroke  Graduated to cane, cont cane  Cont therapies  9. Right shoulder pain  Most tender at [facility] joint  Will order Xray of AC joint  Voltaren gel ordered

## 2016-02-05 ENCOUNTER — Encounter: Payer: Self-pay | Admitting: Occupational Therapy

## 2016-02-05 ENCOUNTER — Ambulatory Visit: Payer: Medicaid Other | Admitting: Occupational Therapy

## 2016-02-05 ENCOUNTER — Ambulatory Visit: Payer: Medicaid Other | Admitting: *Deleted

## 2016-02-05 ENCOUNTER — Ambulatory Visit: Payer: Medicaid Other | Admitting: Physical Therapy

## 2016-02-05 DIAGNOSIS — R278 Other lack of coordination: Secondary | ICD-10-CM

## 2016-02-05 DIAGNOSIS — I69253 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 non-dominant side: Secondary | ICD-10-CM

## 2016-02-05 DIAGNOSIS — I69115 Cognitive social or emotional deficit following nontraumatic intracerebral hemorrhage: Secondary | ICD-10-CM

## 2016-02-05 DIAGNOSIS — M6281 Muscle weakness (generalized): Secondary | ICD-10-CM

## 2016-02-05 DIAGNOSIS — M25511 Pain in right shoulder: Secondary | ICD-10-CM

## 2016-02-05 DIAGNOSIS — R41842 Visuospatial deficit: Secondary | ICD-10-CM

## 2016-02-05 DIAGNOSIS — R2689 Other abnormalities of gait and mobility: Secondary | ICD-10-CM

## 2016-02-05 NOTE — Therapy (Signed)
[facility] [address] [place], [place], [address] Phone: [phone]   Fax:  [phone]  Speech Language Pathology Treatment  Patient Details  Name: [name] MRN: [id number] Date of Birth: 09-Feb-1961 Referring Provider: Debbe Odea MD  Encounter Date: 02/05/2016      End of Session - 02/05/16 1501    SLP Start Time 1315   SLP Stop Time  1400   SLP Time Calculation (min) 45 min   Activity Tolerance Patient tolerated treatment well      Past Medical History:  Diagnosis Date  . Headache   . Hypertension   . Iron deficiency anemia   . Seizures ([place])   . Stroke [facility])     Past Surgical History:  Procedure Laterality Date  . CARDIAC CATHETERIZATION  04/2010   Dr. Terrence Dupont    There were no vitals filed for this visit.      Subjective Assessment - 02/05/16 1321    Patient is accompained by: --  spouse   Pain Score 5    Pain Location Shoulder   Pain Orientation Right   Pain Descriptors / Indicators Aching   Pain Type Chronic pain   Pain Onset More than a month ago               ADULT SLP TREATMENT - 02/05/16 1324      General Information   Behavior/Cognition Alert;Cooperative;Pleasant mood   Patient Positioning Upright in chair     Treatment Provided   Treatment provided Cognitive-Linquistic     Cognitive-Linquistic Treatment   Treatment focused on Cognition;Patient/family/caregiver education   Skilled Treatment Moderately complex deductive reasoning task with min A for inital organization, however pt was able to complete the remainder of the task at mod I level due to increased time.  Sustained/focused attention  WNL for visual tasks. Auditory attention was significantly more challenging despite strategy of note taking. Visual attention with reading allowed for improved gains with novel information.     Assessment / Recommendations / Plan   Plan Continue with current plan of care      Progression Toward Goals   Progression toward goals Progressing toward goals            SLP Short Term Goals - 02/05/16 1506      SLP SHORT TERM GOAL #1   Title pt will incr speech volume to average 69dB for 18/20 sentence responses over three sessions   Baseline adequate vocal intensity for quiet environment   Time 3   Status On-going     SLP SHORT TERM GOAL #2   Title pt will name 8-10 items in mod complex category with modified independence over three sessions   Time 3   Period Weeks   Status On-going     SLP SHORT TERM GOAL #3   Title pt will demo [facility] 5 minutes mod-complex conversation with rare min A over two sessions   Baseline 15 minute conversation with no cues 01/30/16   Time 3   Period Weeks   Status On-going     SLP SHORT TERM GOAL #4   Title Pt to complete moderately complex reasoning tasks at mod I level and >80% acc over 2 sessions.   Time 4   Period Weeks   Status New          SLP Long Term Goals - 02/05/16 1506      SLP LONG TERM GOAL #1   Title pt will demo [facility]  10 minutes mod complex conversation with modified independence (using compensations) in three sessions   Baseline 15 minute conversation wtih independent use of strategies to compensate for word finding errors   Time 7   Period Weeks   Status On-going     SLP LONG TERM GOAL #2   Title pt will name at least average of 6 items in complex/abstract category over three sessions   Time 7   Period Weeks   Status On-going     SLP LONG TERM GOAL #3   Title pt will incr speech volume in 10 minutes mod complex conversation to average 70dB over three sessions   Baseline 15 minute conversation with adequate vocal intensity in quiet environment.   Time 7   Period Weeks   Status On-going          Plan - 02/05/16 1502    Clinical Impression Statement Excellent gains noted with reasoning. Pt continues to c/o difficulty with divided attention/recall of novel information. Visual skills are  stronger than auditory skills.   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 Obstructive sleep apnea 11/29/2015  . Right shoulder pain 11/29/2015  . Adjustment disorder with depressed mood 10/19/2015  . Nocturnal enuresis   . Hypokalemia   . Anemia, iron deficiency   . Super obese ([place])   . Prediabetes   . Orthostatic hypotension   . Supplemental oxygen dependent   . Diastolic dysfunction   . Tachypnea   . AKI (acute kidney injury) ([place])   . Acute blood loss anemia   . Lethargy   . Wheezing   . Benign essential HTN   . ICH (intracerebral hemorrhage) ([place]) 10/06/2015    Vinetta Bergamo MA, CCC-SLP 02/05/2016, 3:07 PM  [facility] [address] [place], [place], [address] Phone: [phone]   Fax:  [phone]   Name: [name] MRN: [id number] Date of Birth: 1960/12/18

## 2016-02-05 NOTE — Therapy (Signed)
[facility] [address] [place], [place], [address] Phone: [phone]   Fax:  [phone]  Occupational Therapy Treatment  Patient Details  Name: [name] MRN: [id number] Date of Birth: 08/11/1960 Referring Provider: Dr. Erlinda Hong  Encounter Date: 02/05/2016      OT End of Session - 02/05/16 1546    Visit Number 2   Number of Visits 17   Date for OT Re-Evaluation 03/17/16   Authorization Type self pay, mcd pending   OT Start Time 1447   OT Stop Time 1530   OT Time Calculation (min) 43 min   Activity Tolerance Patient tolerated treatment well      Past Medical History:  Diagnosis Date  . Headache   . Hypertension   . Iron deficiency anemia   . Seizures ([place])   . Stroke [facility])     Past Surgical History:  Procedure Laterality Date  . CARDIAC CATHETERIZATION  04/2010   Dr. Terrence Dupont    There were no vitals filed for this visit.      Subjective Assessment - 02/05/16 1453    Subjective  I got a precription cream for my shoulder and it helps   Pertinent History see epic - pt with significant shoulder pain   Patient Stated Goals reduce pain and be able to move right arm   Currently in Pain? No/denies  pt has prescription cream that she says "took pain away"                      OT Treatments/Exercises (OP) - 02/05/16 0001      ADLs   ADL Comments Discussed shoulder pain following ICH, shoulder alignment and shoulder integrity. Pt and husband verbalized understanding.  Demonstrated and had pt and husband return demonstrate bed positioning on back and on left side with scapula and shoulder girdle aligned and supported. Pt reported the shoulder felt "much better" and that she was more comfortable.  Took pics of supine and L sidelying on cell phone to improve carry over at home.  Discouraged pt from sleeping on R side for the present moment as pt is unable to align shoulder to lay on it and pt  reports increased pain when she does lie on it.  Pt in agreement.      Neurological Re-education Exercises   Other Exercises 1 Neuro re ed in sitting to address low to mid reach in closed chain activity with UE ranger, adding resistance to end of range in various planes.  Also addressed bilateral low to mid reach in closed chain using ball with cues for pt to place pressure between her hands into the ball in order to activiate proximal muscles - pt able to complete approximately 85* of shoulder flexion without pain., 10 reps x 2.  If pt remains painfree with this activity through next session could be incorporated into HEP.                  OT Education - 02/05/16 1544    Education provided Yes   Education Details bed positioning for RUE in supine and left sidelying   Person(s) Educated Patient;Spouse   Methods Explanation;Demonstration;Verbal cues;Other (comment)  pt took picture on cell phone   Comprehension Verbalized understanding;Returned demonstration          OT Short Term Goals - 02/05/16 1545      OT SHORT TERM GOAL #1   Title I with inital HEP. due 02/17/16  Baseline dependent   Time 4   Period Weeks   Status On-going     OT SHORT TERM GOAL #2   Title Pt will verbalize understanding of RUE positioning to minimize pain and risk for injury.   Baseline dependent   Time 4   Period Weeks   Status On-going     OT SHORT TERM GOAL #3   Title Pt will verbalize understanding of compensatory strategies for short term memory deficits.   Baseline dependent   Time 4   Period Weeks   Status On-going     OT SHORT TERM GOAL #4   Title Pt will demonstrate 90 shoulder flexion with pain less than or equal to 4/10 for functional reach.   Baseline 70*   Time 4   Period Weeks   Status On-going     OT SHORT TERM GOAL #5   Title Pt will perform dressing modified independently.   Baseline min A   Time 4   Period Weeks   Status On-going           OT Long Term  Goals - 02/05/16 1545      OT LONG TERM GOAL #1   Title Pt will perform mod complex cooking/ home management at a modified indpendent level demonstrating good safety awareness. due 03/17/16   Baseline performing light home management and basic cooking with assistance   Time 8   Period Weeks   Status On-going     OT LONG TERM GOAL #2   Title Pt will demonstrate ability to perform tabletop and environmental scanning tasks with 95% better accuracy and no significant diplopia.   Baseline Pt reports difficulty reading due to significant diplopia, pt does not perform effective environmental scanning due to c/o of diplopia   Time 8   Period Weeks   Status On-going     OT LONG TERM GOAL #3   Title Pt will resume use of RUE as a non dominant assist with ADLs/ IADLs at least 50% of the time with pain no greater than 3/10   Baseline unable to use RUE consistently  due to severe pain   Time 8   Period Weeks   Status On-going     OT LONG TERM GOAL #4   Title Pt will demonstrate improved fine motor coordiantion as evidenced by decreasing RUE 9 hole peg test score by 4 secs from eval.   Baseline to be assessed   Time 8   Period Weeks   Status On-going     OT LONG TERM GOAL #5   Title Pt will demonstrate ability to perform  a physical and cognitive task simultaneously with 90% or better accuracy.   Baseline to be further assessed, impaired attention   Time 8   Period Weeks   Status On-going               Plan - 02/05/16 1545    Clinical Impression Statement Pt progressing toward goals.  Reviewed OT goals with pt and husband today - pt in agreement with goals and plan.   Rehab Potential Good   OT Frequency 2x / week   OT Duration 8 weeks   OT Treatment/Interventions Self-care/ADL training;Fluidtherapy;Moist Heat;DME and/or AE instruction;Splinting;Patient/family education;Balance training;Therapeutic exercises;Therapeutic Higher education careers adviser;Therapeutic  exercise;Ultrasound;Cryotherapy;Iontophoresis;Neuromuscular education;Functional Mobility Training;Passive range of motion;Cognitive remediation/compensation;Visual/perceptual remediation/compensation;Manual Therapy;Electrical Stimulation;Parrafin   Plan check carry over for positioning RUE at home, initiate HEP   Consulted and Agree with Plan of Care Patient;Family member/caregiver   Family Member  Consulted husband      Patient will benefit from skilled therapeutic intervention in order to improve the following deficits and impairments:     Visit Diagnosis: 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 Obstructive sleep apnea 11/29/2015  . Right shoulder pain 11/29/2015  . Adjustment disorder with depressed mood 10/19/2015  . Nocturnal enuresis   . Hypokalemia   . Anemia, iron deficiency   . Super obese ([place])   . Prediabetes   . Orthostatic hypotension   . Supplemental oxygen dependent   . Diastolic dysfunction   . Tachypnea   . AKI (acute kidney injury) ([place])   . Acute blood loss anemia   . Lethargy   . Wheezing   . Benign essential HTN   . ICH (intracerebral hemorrhage) ([place]) 10/06/2015    Quay Burow, OTR/L 02/05/2016, 3:48 PM  [facility] [address] [place], [place], [address] Phone: [phone]   Fax:  [phone]  Name: [name] MRN: [id number] Date of Birth: 01/31/1961

## 2016-02-05 NOTE — Therapy (Signed)
[facility] [address] [place], [place], [address] Phone: [phone]   Fax:  [phone]  Physical Therapy Treatment  Patient Details  Name: NUHA VALDIVIESO MRN: [id number] Date of Birth: [birth year] Referring Provider: Rosalin Hawking, MD  Encounter Date: 02/05/2016      PT End of Session - 02/05/16 1655    Visit Number 5   Number of Visits 17   Date for PT Re-Evaluation 03/18/16   Authorization Type Possible MCD-waiting to get number from pt (understands that she is self pay until then)   PT Start Time 1402   PT Stop Time 1446   PT Time Calculation (min) 44 min      Past Medical History:  Diagnosis Date  . Headache   . Hypertension   . Iron deficiency anemia   . Seizures ([place])   . Stroke [facility])     Past Surgical History:  Procedure Laterality Date  . CARDIAC CATHETERIZATION  04/2010   Dr. Terrence Dupont    There were no vitals filed for this visit.      Subjective Assessment - 02/05/16 1623    Subjective Pt wearing tennis shoes today - states she wore these shoes as asked to do by primary PT, Cameron Sprang; states she usually wears crocs because they are not as heavy   Patient is accompained by: Family member   Limitations House hold activities;Walking                         [facility] Adult PT Treatment/Exercise - 02/05/16 1416      Ambulation/Gait   Ambulation/Gait Yes   Ambulation/Gait Assistance 5: Supervision   Ambulation/Gait Assistance Details gait trained inside // bars with use of mirror for visual feedback - cues to increase step length and speed; pt gait trained 4 rep forward and then backward with cues to keep RLE abducted during backward step;     Ambulation Distance (Feet) 80 Feet  inside bars; 120' with cane around  track   Assistive device Straight cane  small rubber quad tip on base   Gait Pattern Step-through pattern   Ambulation Surface Level;Indoor     High Level Balance    High Level Balance Activities Side stepping;Direction changes;Turns   High Level Balance Comments Performed initially with UE support, then progressed to without UE support     Exercises   Exercises Knee/Hip     Knee/Hip Exercises: Aerobic   Recumbent Bike Pt performed 3" on [employer] - performed from bariatric chair  -UE's and LE's used; pt did c/o mild dizziness after this exercise but subsided after approx. 1" seated rest period     Knee/Hip Exercises: Standing   Heel Raises Both;1 set;10 reps  RLE only 10 reps   Forward Step Up Right;1 set;10 reps;Step Height: 6"   Other Standing Knee Exercises Pt performed standing hip flexion, extension and abduction with 3# weight 10 reps each leg with UE support     NeuroRe-ed:  Alternate tap ups to 6" step with UE support initially then progressing without UE support - with CGA  Stepping down from // bars to floor with LUE and pushing up with RLE for R quad eccentric strengthening x 10 reps Marching in place without UE support  Ankle sways x 10 reps without UE support             PT Short Term Goals - 01/23/16 1525      PT  SHORT TERM GOAL #1   Title Pt will initiate HEP in order to indicate improved functional mobility.  (Target Date: 02/15/16)   Baseline dependent   Time 4   Period Weeks     PT SHORT TERM GOAL #2   Title Will assess BERG balance test and improve score by 4 points in order to indicate decreased fall risk.     Baseline 01/22/16 Score: 29   Time 4   Period Weeks   Status New     PT SHORT TERM GOAL #3   Title Pt will improve gait speed to 1.58 ft/sec in order to indicate decreased fall risk and improved efficiency of gait.     Baseline .98 ft/sec   Time 4   Period Weeks   Status New     PT SHORT TERM GOAL #4   Title Will assess 6MWT and improve distance by 150' in order to indicate improved functional endurance.     Baseline 01/23/16 Pt ambulated 308 ft during 6MWT   Time 4   Period Weeks   Status New      PT SHORT TERM GOAL #5   Title Pt will ambulate 300' w/ LRAD at mod I level over indoor surfaces in order to indicate safe home negotiation.     Baseline 100' w/ SPC at min/guard level   Time 4   Period Weeks   Status New           PT Long Term Goals - 01/18/16 1633      PT LONG TERM GOAL #1   Title Pt will be independent with HEP in order to indicate improved functional mobility.  (Target Date: 03/14/16)   Baseline dependent   Time 8   Period Weeks   Status New     PT LONG TERM GOAL #2   Title Pt will improve BERG balance test score 8 points from baseline in order to indicate decreased fall risk.     Baseline unable to assess due to time constraint   Time 8   Period Weeks   Status New     PT LONG TERM GOAL #3   Title Pt will increase gait speed to 2.18 ft/sec w/ LRAD in order to indicate decreased fall risk and improved efficiency of gait.     Baseline .98 ft/sec   Time 8   Period Weeks   Status New     PT LONG TERM GOAL #4   Title Pt will verbalize return to community fitness program in order to indicate ongoing work on functional endurance.     Baseline currently not involved in program.    Time 8   Period Weeks   Status New     PT LONG TERM GOAL #5   Title Pt will ambulate 500' w/ LRAD at mod I level over unlevel paved surfaces (including ramp/curb) in order to indicate safe return to community.     Baseline min/guard 100' w/ SPC over indoor surfaces   Time 8   Period Weeks   Status New               Plan - 02/05/16 1655    Clinical Impression Statement Pt has decreased activity tolerance; required frequent short rest periods due to fatigue:  pt reported R knee felt "shaky" with weight bearing exercises but pt declined seated rest period - was able to rest in standing position inside // bars with UE support; gait velocity appears to be decreased due to  lack of confidence with balance and RLE strength                                                                                                                        Rehab Potential Good   PT Frequency 2x / week   PT Duration 8 weeks   PT Treatment/Interventions ADLs/Self Care Home Management;Electrical Stimulation;DME Instruction;Gait training;Stair training;Functional mobility training;Therapeutic activities;Neuromuscular re-education;Balance training;Therapeutic exercise;Patient/family education;Orthotic Fit/Training;Vestibular   PT Next Visit Plan add balance to HEP; continue gait/RLE strengthening   Consulted and Agree with Plan of Care Patient;Family member/caregiver   Family Member Consulted husband      Patient will benefit from skilled therapeutic intervention in order to improve the following deficits and impairments:  Abnormal gait, Decreased activity tolerance, Decreased balance, Decreased mobility, Decreased strength, Dizziness, Impaired perceived functional ability, Impaired flexibility, Improper body mechanics, Postural dysfunction, Impaired UE functional use  Visit Diagnosis: Other abnormalities of gait and mobility  Muscle weakness (generalized)     Problem List Patient Active Problem List   Diagnosis Date Noted  . Obstructive sleep apnea 11/29/2015  . Right shoulder pain 11/29/2015  . Adjustment disorder with depressed mood 10/19/2015  . Nocturnal enuresis   . Hypokalemia   . Anemia, iron deficiency   . Super obese ([place])   . Prediabetes   . Orthostatic hypotension   . Supplemental oxygen dependent   . Diastolic dysfunction   . Tachypnea   . AKI (acute kidney injury) ([place])   . Acute blood loss anemia   . Lethargy   . Wheezing   . Benign essential HTN   . ICH (intracerebral hemorrhage) ([place]) 10/06/2015    Tequan Redmon, [place], PT 02/05/2016, 5:04 PM  [facility] [address] [place], [place], [address] Phone: [phone]   Fax:  [phone]  Name: ASSATA SHOAFF MRN: [id number] Date  of Birth: [birth year]

## 2016-02-07 ENCOUNTER — Ambulatory Visit: Payer: Medicaid Other | Admitting: Occupational Therapy

## 2016-02-07 ENCOUNTER — Ambulatory Visit: Payer: Medicaid Other

## 2016-02-07 ENCOUNTER — Encounter: Payer: Self-pay | Admitting: Physical Therapy

## 2016-02-07 ENCOUNTER — Ambulatory Visit: Payer: Medicaid Other | Admitting: Physical Therapy

## 2016-02-07 DIAGNOSIS — R278 Other lack of coordination: Secondary | ICD-10-CM

## 2016-02-07 DIAGNOSIS — M25511 Pain in right shoulder: Secondary | ICD-10-CM

## 2016-02-07 DIAGNOSIS — R471 Dysarthria and anarthria: Secondary | ICD-10-CM

## 2016-02-07 DIAGNOSIS — R41842 Visuospatial deficit: Secondary | ICD-10-CM

## 2016-02-07 DIAGNOSIS — I69253 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 non-dominant side: Secondary | ICD-10-CM

## 2016-02-07 DIAGNOSIS — R41841 Cognitive communication deficit: Secondary | ICD-10-CM

## 2016-02-07 DIAGNOSIS — R2689 Other abnormalities of gait and mobility: Secondary | ICD-10-CM

## 2016-02-07 DIAGNOSIS — M6281 Muscle weakness (generalized): Secondary | ICD-10-CM

## 2016-02-07 DIAGNOSIS — R4701 Aphasia: Secondary | ICD-10-CM

## 2016-02-07 DIAGNOSIS — R2681 Unsteadiness on feet: Secondary | ICD-10-CM

## 2016-02-07 NOTE — Patient Instructions (Addendum)
Seated hold a ball between your hands , raise arms up to below shoulder height without elevating shoulder or arching back,then  lower ball back to your lap. Keep elbows straight.   Perform 2 sets of 10 reps 1x day  Seated hold a ball in between both hands, straighten elbows, then bend elbows and bring ball to your chest     Perform 2 sets of 10 reps 1x day   Coordination Activities  Perform the following activities for 20 minutes 1 times per day with right hand(s).   Flip cards 1 at a time as fast as you can.  Deal cards with your thumb (Hold deck in hand and push card off top with thumb).  Pick up coins and place in container or coin bank.  Pick up coins and stack.  Pick up coins one at a time until you get 5-10 in your hand, then move coins from palm to fingertips to stack one at a time.

## 2016-02-07 NOTE — Therapy (Signed)
[facility] [address] [place], [place], [address] Phone: [phone]   Fax:  [phone]  Occupational Therapy Treatment  Patient Details  Name: [name] MRN: [id number] Date of Birth: [birth year] Referring Provider: Dr. Erlinda Hong  Encounter Date: 02/07/2016      OT End of Session - 02/07/16 1646    Visit Number 3   Number of Visits 17   Date for OT Re-Evaluation 03/17/16   Authorization Type self pay, mcd pending   OT Start Time 1405   OT Stop Time 1445   OT Time Calculation (min) 40 min   Activity Tolerance Patient tolerated treatment well   Behavior During Therapy [facility] for tasks assessed/performed      Past Medical History:  Diagnosis Date  . Headache   . Hypertension   . Iron deficiency anemia   . Seizures ([place])   . Stroke [facility])     Past Surgical History:  Procedure Laterality Date  . CARDIAC CATHETERIZATION  04/2010   Dr. Terrence Dupont    There were no vitals filed for this visit.      Subjective Assessment - 02/07/16 1645    Pertinent History see epic - pt with significant shoulder pain   Patient Stated Goals reduce pain and be able to move right arm   Currently in Pain? Yes   Pain Score 4    Pain Location Shoulder   Pain Orientation Right   Pain Descriptors / Indicators Aching   Pain Type Neuropathic pain   Pain Onset More than a month ago   Pain Frequency Intermittent   Aggravating Factors  malpositioning   Pain Relieving Factors repositioning   Multiple Pain Sites No          supine closed chain shoulder flexion and chest press with medium ball, min facilitation v.c. For positioning Seated pt was instructed in HEP, min v.c for positioning.                    OT Education - 02/07/16 1643    Education provided Yes   Education Details ball HEP seated, functional use / coordination activities for right hand   Person(s) Educated Patient   Methods  Explanation;Demonstration;Verbal cues;Handout   Comprehension Verbalized understanding;Returned demonstration;Verbal cues required          OT Short Term Goals - 02/05/16 1545      OT SHORT TERM GOAL #1   Title I with inital HEP. due 02/17/16   Baseline dependent   Time 4   Period Weeks   Status On-going     OT SHORT TERM GOAL #2   Title Pt will verbalize understanding of RUE positioning to minimize pain and risk for injury.   Baseline dependent   Time 4   Period Weeks   Status On-going     OT SHORT TERM GOAL #3   Title Pt will verbalize understanding of compensatory strategies for short term memory deficits.   Baseline dependent   Time 4   Period Weeks   Status On-going     OT SHORT TERM GOAL #4   Title Pt will demonstrate 90 shoulder flexion with pain less than or equal to 4/10 for functional reach.   Baseline 70*   Time 4   Period Weeks   Status On-going     OT SHORT TERM GOAL #5   Title Pt will perform dressing modified independently.   Baseline min A   Time 4   Period  Weeks   Status On-going           OT Long Term Goals - 02/05/16 1545      OT LONG TERM GOAL #1   Title Pt will perform mod complex cooking/ home management at a modified indpendent level demonstrating good safety awareness. due 03/17/16   Baseline performing light home management and basic cooking with assistance   Time 8   Period Weeks   Status On-going     OT LONG TERM GOAL #2   Title Pt will demonstrate ability to perform tabletop and environmental scanning tasks with 95% better accuracy and no significant diplopia.   Baseline Pt reports difficulty reading due to significant diplopia, pt does not perform effective environmental scanning due to c/o of diplopia   Time 8   Period Weeks   Status On-going     OT LONG TERM GOAL #3   Title Pt will resume use of RUE as a non dominant assist with ADLs/ IADLs at least 50% of the time with pain no greater than 3/10   Baseline unable to use  RUE consistently  due to severe pain   Time 8   Period Weeks   Status On-going     OT LONG TERM GOAL #4   Title Pt will demonstrate improved fine motor coordiantion as evidenced by decreasing RUE 9 hole peg test score by 4 secs from eval.   Baseline to be assessed   Time 8   Period Weeks   Status On-going     OT LONG TERM GOAL #5   Title Pt will demonstrate ability to perform  a physical and cognitive task simultaneously with 90% or better accuracy.   Baseline to be further assessed, impaired attention   Time 8   Period Weeks   Status On-going               Plan - 02/07/16 1644    Clinical Impression Statement Pt is progresing towards goals with improved overall pain.   Rehab Potential Good   OT Frequency 2x / week   OT Duration 8 weeks   Plan check on how HEP is going   Consulted and Agree with Plan of Care Patient      Patient will benefit from skilled therapeutic intervention in order to improve the following deficits and impairments:  Abnormal gait, Decreased coordination, Decreased range of motion, Impaired sensation, Increased edema, Decreased safety awareness, Decreased endurance, Decreased activity tolerance, Decreased knowledge of precautions, Pain, Impaired UE functional use, Decreased balance, Decreased cognition, Decreased mobility, Decreased strength, Impaired vision/preception, Impaired perceived functional ability  Visit Diagnosis: Muscle weakness (generalized)  Other lack of coordination  Acute pain of right shoulder  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 Obstructive sleep apnea 11/29/2015  . Right shoulder pain 11/29/2015  . Adjustment disorder with depressed mood 10/19/2015  . Nocturnal enuresis   . Hypokalemia   . Anemia, iron deficiency   . Super obese ([place])   .  Prediabetes   . Orthostatic hypotension   . Supplemental oxygen dependent   . Diastolic dysfunction   . Tachypnea   . AKI (acute kidney injury) ([place])   . Acute blood loss anemia   . Lethargy   . Wheezing   . Benign essential HTN   . ICH (intracerebral hemorrhage) ([place]) 10/06/2015    Jaykub Mackins  02/07/2016, 4:47 PM Theone Murdoch, OTR/L [phone] Phone: [phone] 4:48 PM 02/07/16 [facility] [address] [place], [place], [address] Phone: [phone]   Fax:  [phone]  Name: [name] MRN: [id number] Date of Birth: Aug 07, 1960

## 2016-02-07 NOTE — Therapy (Signed)
[facility] [address] [place], [place], [address] Phone: [phone]   Fax:  [phone]  Speech Language Pathology Treatment  Patient Details  Name: [name] MRN: [id number] Date of Birth: 1960-04-27 Referring Provider: Debbe Odea MD  Encounter Date: 02/07/2016      End of Session - 02/07/16 1358    Visit Number 5   Number of Visits 17   Date for SLP Re-Evaluation 04/05/16   SLP Start Time 64   SLP Stop Time  1400   SLP Time Calculation (min) 42 min   Activity Tolerance Patient tolerated treatment well      Past Medical History:  Diagnosis Date  . Headache   . Hypertension   . Iron deficiency anemia   . Seizures ([place])   . Stroke [facility])     Past Surgical History:  Procedure Laterality Date  . CARDIAC CATHETERIZATION  04/2010   Dr. Terrence Dupont    There were no vitals filed for this visit.      Subjective Assessment - 02/07/16 1322    Subjective "We did who was married to who and some other thing." (pt, re: last session)               ADULT SLP TREATMENT - 02/07/16 1323      General Information   Behavior/Cognition Alert;Cooperative;Pleasant mood     Treatment Provided   Treatment provided Cognitive-Linquistic     Pain Assessment   Pain Assessment 0-10   Pain Score 4    Pain Location rt shoulder   Pain Descriptors / Indicators Tender   Pain Intervention(s) Monitored during session     Cognitive-Linquistic Treatment   Treatment focused on Cognition   Skilled Treatment SLP facilitated pt's linguistic relationships and linguistic reasoning in min-mod complex linguistic reasoning puzzle (rhyming pairs). Pt req'd min verbal cues usually, and mod verbal cues occasionally. Pt agreed with SLP that this task would not have taken her as long premorbidly.      Assessment / Recommendations / Plan   Plan Continue with current plan of care     Progression Toward Goals   Progression  toward goals Progressing toward goals            SLP Short Term Goals - 02/07/16 1403      SLP SHORT TERM GOAL #1   Title pt will incr speech volume to average 69dB for 18/20 sentence responses over three sessions   Baseline adequate vocal intensity for quiet environment   Time 3   Status On-going     SLP SHORT TERM GOAL #2   Title pt will name 8-10 items in mod complex category with modified independence over three sessions   Time 3   Period Weeks   Status On-going     SLP SHORT TERM GOAL #3   Title pt will demo [facility] 5 minutes mod-complex conversation with rare min A over two sessions   Baseline 15 minute conversation with no cues 01/30/16   Time 3   Period Weeks   Status On-going     SLP SHORT TERM GOAL #4   Title Pt to complete moderately complex reasoning tasks at mod I level and >80% acc over 2 sessions.   Time 3   Period Weeks   Status On-going          SLP Long Term Goals - 02/07/16 1403      SLP LONG TERM GOAL #1   Title pt will demo  WFL 10 minutes mod complex conversation with modified independence (using compensations) in three sessions   Baseline 15 minute conversation wtih independent use of strategies to compensate for word finding errors   Time 7   Period Weeks   Status On-going     SLP LONG TERM GOAL #2   Title pt will name at least average of 6 items in complex/abstract category over three sessions   Time 7   Period Weeks   Status On-going     SLP LONG TERM GOAL #3   Title pt will incr speech volume in 10 minutes mod complex conversation to average 70dB over three sessions   Baseline 15 minute conversation with adequate vocal intensity in quiet environment.   Time 7   Period Weeks   Status On-going          Plan - 02/07/16 1358    Clinical Impression Statement Pt workedtoday with mod complex linguistic reasoning/relationship task and she req'd usual SLP verbal cues. Skilled ST remains necessary to address cognitive-linguistic skills back  to as close to baseline as possible.    Speech Therapy Frequency 2x / week   Treatment/Interventions Compensatory strategies;Functional tasks;Cueing hierarchy;Internal/external aids;Multimodal communcation approach;SLP instruction and feedback;Language facilitation;Patient/family education;Cognitive reorganization   Potential to Achieve Goals Good      Patient will benefit from skilled therapeutic intervention in order to improve the following deficits and impairments:   Cognitive communication deficit  Aphasia  Dysarthria and anarthria    Problem List Patient Active Problem List   Diagnosis Date Noted  . Obstructive sleep apnea 11/29/2015  . Right shoulder pain 11/29/2015  . Adjustment disorder with depressed mood 10/19/2015  . Nocturnal enuresis   . Hypokalemia   . Anemia, iron deficiency   . Super obese ([place])   . Prediabetes   . Orthostatic hypotension   . Supplemental oxygen dependent   . Diastolic dysfunction   . Tachypnea   . AKI (acute kidney injury) ([place])   . Acute blood loss anemia   . Lethargy   . Wheezing   . Benign essential HTN   . ICH (intracerebral hemorrhage) ([place]) 10/06/2015    Carrier ,[place], [place]  02/07/2016, 2:06 PM  [facility] [address] [place], [place], [address] Phone: [phone]   Fax:  [phone]   Name: [name] MRN: [id number] Date of Birth: December 06, 1960

## 2016-02-07 NOTE — Patient Instructions (Signed)
  Please complete the assigned speech therapy homework and return it to your next session.  

## 2016-02-08 NOTE — Therapy (Signed)
[facility] [address] [place], [place], [address] Phone: [phone]   Fax:  [phone]  Physical Therapy Treatment  Patient Details  Name: [name] MRN: [id number] Date of Birth: 1960/11/17 Referring Provider: Rosalin Hawking, MD  Encounter Date: 02/07/2016      PT End of Session - 02/07/16 1453    Visit Number 6   Number of Visits 17   Date for PT Re-Evaluation 03/18/16   Authorization Type Possible MCD-waiting to get number from pt (understands that she is self pay until then)   PT Start Time 1448   PT Stop Time 1530   PT Time Calculation (min) 42 min   Equipment Utilized During Treatment Gait belt   Activity Tolerance Patient tolerated treatment well   Behavior During Therapy [facility] for tasks assessed/performed      Past Medical History:  Diagnosis Date  . Headache   . Hypertension   . Iron deficiency anemia   . Seizures ([place])   . Stroke [facility])     Past Surgical History:  Procedure Laterality Date  . CARDIAC CATHETERIZATION  04/2010   Dr. Terrence Dupont    There were no vitals filed for this visit.      Subjective Assessment - 02/07/16 1451    Subjective No new complaints. To clinic with sneakers on again today. No falls, does report some "bumps" into the wall.    Patient is accompained by: Family member   Limitations House hold activities;Walking   Currently in Pain? Yes   Pain Score --  not pain, just numb   Pain Location Neck   Pain Orientation Right   Pain Descriptors / Indicators Numbness   Pain Type Chronic pain   Pain Onset More than a month ago   Pain Frequency Constant   Aggravating Factors  lying on arm   Pain Relieving Factors medication              [facility] Adult PT Treatment/Exercise - 02/07/16 1456      Transfers   Transfers Sit to Stand;Stand to Sit   Sit to Stand 6: Modified independent (Device/Increase time);With upper extremity assist;From bed;From chair/3-in-1   Stand to Sit 6: Modified independent (Device/Increase time);With upper extremity assist;To bed;To chair/3-in-1     Ambulation/Gait   Ambulation/Gait Yes   Ambulation/Gait Assistance 5: Supervision   Ambulation/Gait Assistance Details cues for increased bil step length/increased stride length with gait. no balance issues noted today. continues to have decreased gait speed.   Ambulation Distance (Feet) 230 Feet   Assistive device Straight cane  with rubber quad tip   Gait Pattern Step-through pattern;Decreased stride length;Narrow base of support;Decreased arm swing - right;Decreased arm swing - left   Ambulation Surface Level;Indoor     High Level Balance   High Level Balance Activities Side stepping;Marching forwards;Marching backwards;Tandem walking  tandem gait fwd/bwd, toe walking fwd/bwd   High Level Balance Comments at counter top with intermittent UE support: 2-3 laps each with min guard assist to min assist for balance     Knee/Hip Exercises: Aerobic   Nustep x 4 extremities at level 4 with goal >/- 30 steps per minute x 8 minutes for strengthening and activity tolerance              PT Short Term Goals - 01/23/16 1525      PT SHORT TERM GOAL #1   Title Pt will initiate HEP in order to indicate improved functional mobility.  (Target Date: 02/15/16)  Baseline dependent   Time 4   Period Weeks     PT SHORT TERM GOAL #2   Title Will assess BERG balance test and improve score by 4 points in order to indicate decreased fall risk.     Baseline 01/22/16 Score: 29   Time 4   Period Weeks   Status New     PT SHORT TERM GOAL #3   Title Pt will improve gait speed to 1.58 ft/sec in order to indicate decreased fall risk and improved efficiency of gait.     Baseline .98 ft/sec   Time 4   Period Weeks   Status New     PT SHORT TERM GOAL #4   Title Will assess 6MWT and improve distance by 150' in order to indicate improved functional endurance.     Baseline 01/23/16 Pt  ambulated 308 ft during 6MWT   Time 4   Period Weeks   Status New     PT SHORT TERM GOAL #5   Title Pt will ambulate 300' w/ LRAD at mod I level over indoor surfaces in order to indicate safe home negotiation.     Baseline 100' w/ SPC at min/guard level   Time 4   Period Weeks   Status New           PT Long Term Goals - 01/18/16 1633      PT LONG TERM GOAL #1   Title Pt will be independent with HEP in order to indicate improved functional mobility.  (Target Date: 03/14/16)   Baseline dependent   Time 8   Period Weeks   Status New     PT LONG TERM GOAL #2   Title Pt will improve BERG balance test score 8 points from baseline in order to indicate decreased fall risk.     Baseline unable to assess due to time constraint   Time 8   Period Weeks   Status New     PT LONG TERM GOAL #3   Title Pt will increase gait speed to 2.18 ft/sec w/ LRAD in order to indicate decreased fall risk and improved efficiency of gait.     Baseline .98 ft/sec   Time 8   Period Weeks   Status New     PT LONG TERM GOAL #4   Title Pt will verbalize return to community fitness program in order to indicate ongoing work on functional endurance.     Baseline currently not involved in program.    Time 8   Period Weeks   Status New     PT LONG TERM GOAL #5   Title Pt will ambulate 500' w/ LRAD at mod I level over unlevel paved surfaces (including ramp/curb) in order to indicate safe return to community.     Baseline min/guard 100' w/ SPC over indoor surfaces   Time 8   Period Weeks   Status New            Plan - 02/07/16 1453    Clinical Impression Statement today's skilled session continued to address gait deficits, balance and strengthening without any issues reported. Pt is making steady progress toward goals and should benefit from continued PT to progress toward unmet goals.   Rehab Potential Good   PT Frequency 2x / week   PT Duration 8 weeks   PT Treatment/Interventions ADLs/Self  Care Home Management;Electrical Stimulation;DME Instruction;Gait training;Stair training;Functional mobility training;Therapeutic activities;Neuromuscular re-education;Balance training;Therapeutic exercise;Patient/family education;Orthotic Fit/Training;Vestibular   PT Next Visit Plan continue  gait/RLE strengthening, high level balance activities   Consulted and Agree with Plan of Care Patient;Family member/caregiver   Family Member Consulted husband      Patient will benefit from skilled therapeutic intervention in order to improve the following deficits and impairments:  Abnormal gait, Decreased activity tolerance, Decreased balance, Decreased mobility, Decreased strength, Dizziness, Impaired perceived functional ability, Impaired flexibility, Improper body mechanics, Postural dysfunction, Impaired UE functional use  Visit Diagnosis: Muscle weakness (generalized)  Other lack of coordination  Unsteadiness on feet  Other abnormalities of gait and mobility     Problem List Patient Active Problem List   Diagnosis Date Noted  . Obstructive sleep apnea 11/29/2015  . Right shoulder pain 11/29/2015  . Adjustment disorder with depressed mood 10/19/2015  . Nocturnal enuresis   . Hypokalemia   . Anemia, iron deficiency   . Super obese ([place])   . Prediabetes   . Orthostatic hypotension   . Supplemental oxygen dependent   . Diastolic dysfunction   . Tachypnea   . AKI (acute kidney injury) ([place])   . Acute blood loss anemia   . Lethargy   . Wheezing   . Benign essential HTN   . ICH (intracerebral hemorrhage) ([place]) 10/06/2015    Willow Ora, PTA, [facility] [address], [place], [place] [address] [phone] 02/08/16, 10:53 AM   Name: [name] MRN: [id number] Date of Birth: February 25, 1961

## 2016-02-12 ENCOUNTER — Encounter: Payer: Self-pay | Admitting: Physical Therapy

## 2016-02-12 ENCOUNTER — Ambulatory Visit: Payer: Medicaid Other | Admitting: Physical Therapy

## 2016-02-12 ENCOUNTER — Ambulatory Visit: Payer: Medicaid Other | Admitting: Occupational Therapy

## 2016-02-12 DIAGNOSIS — M6281 Muscle weakness (generalized): Secondary | ICD-10-CM

## 2016-02-12 DIAGNOSIS — R2681 Unsteadiness on feet: Secondary | ICD-10-CM

## 2016-02-12 DIAGNOSIS — R2689 Other abnormalities of gait and mobility: Secondary | ICD-10-CM

## 2016-02-12 DIAGNOSIS — I69253 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 non-dominant side: Secondary | ICD-10-CM | POA: Diagnosis not present

## 2016-02-12 DIAGNOSIS — R278 Other lack of coordination: Secondary | ICD-10-CM

## 2016-02-12 DIAGNOSIS — M25511 Pain in right shoulder: Secondary | ICD-10-CM

## 2016-02-12 DIAGNOSIS — R41842 Visuospatial deficit: Secondary | ICD-10-CM

## 2016-02-12 NOTE — Therapy (Signed)
[facility] [address] [place], [place], [address] Phone: [phone]   Fax:  [phone]  Physical Therapy Treatment  Patient Details  Name: [name] MRN: [id number] Date of Birth: 12-Mar-1961 Referring Provider: Rosalin Hawking, MD  Encounter Date: 02/12/2016      PT End of Session - 02/12/16 1619    Visit Number 7   Number of Visits 17   Date for PT Re-Evaluation 03/18/16   Authorization Type Possible MCD-waiting to get number from pt (understands that she is self pay until then)   PT Start Time 1103   PT Stop Time 1145   PT Time Calculation (min) 42 min   Equipment Utilized During Treatment Gait belt   Activity Tolerance Patient tolerated treatment well   Behavior During Therapy [facility] for tasks assessed/performed      Past Medical History:  Diagnosis Date  . Headache   . Hypertension   . Iron deficiency anemia   . Seizures ([place])   . Stroke [facility])     Past Surgical History:  Procedure Laterality Date  . CARDIAC CATHETERIZATION  04/2010   Dr. Terrence Dupont    There were no vitals filed for this visit.      Subjective Assessment - 02/12/16 1107    Subjective Pt continues to stumble at times but is able to catch herself.   Currently in Pain? Yes   Pain Score 4    Pain Location Shoulder   Pain Orientation Right   Pain Descriptors / Indicators Aching   Pain Onset More than a month ago   Pain Frequency Intermittent            [facility] PT Assessment - 02/12/16 0001      6 minute walk test results    Aerobic Endurance Distance Walked 500   Endurance additional comments Ambulated with straight cane;  one rest break due to gym being very crowded; Step-through pattern;Decreased stride length;Lateral hip instability;Trunk flexed;Wide base of support                     [facility] Adult PT Treatment/Exercise - 02/12/16 0001      Ambulation/Gait   Ambulation/Gait Yes   Ambulation/Gait Assistance  5: Supervision   Ambulation/Gait Assistance Details 32mn walk   Ambulation Distance (Feet) 500 Feet   Assistive device Straight cane   Gait Pattern Step-through pattern;Decreased stride length;Narrow base of support;Decreased arm swing - right;Decreased arm swing - left   Ambulation Surface Level;Indoor     Knee/Hip Exercises: Aerobic   Nustep x 4 extremities at level 4 with goal >/- 30 steps per minute x 8 minutes for strengthening and activity tolerance             Balance Exercises - 02/12/16 1132      Balance Exercises: Standing   Step Ups 4 inch;Intermittent UE support;Forward  + Alternate foot taps             PT Short Term Goals - 02/12/16 1621      PT SHORT TERM GOAL #1   Title Pt will initiate HEP in order to indicate improved functional mobility.  (Target Date: 02/15/16)   Baseline dependent   Time 4   Period Weeks     PT SHORT TERM GOAL #2   Title Will assess BERG balance test and improve score by 4 points in order to indicate decreased fall risk.     Baseline 01/22/16 Score: 29   Time 4  Period Weeks   Status New     PT SHORT TERM GOAL #3   Title Pt will improve gait speed to 1.58 ft/sec in order to indicate decreased fall risk and improved efficiency of gait.     Baseline .98 ft/sec   Time 4   Period Weeks   Status New     PT SHORT TERM GOAL #4   Title Will assess 6MWT and improve distance by 150' in order to indicate improved functional endurance.     Baseline 02/12/16 Pt ambulated 500 ft during 6MWT   Time 4   Period Weeks   Status Achieved     PT SHORT TERM GOAL #5   Title Pt will ambulate 300' w/ LRAD at mod I level over indoor surfaces in order to indicate safe home negotiation.     Baseline 100' w/ SPC at min/guard level   Time 4   Period Weeks   Status New           PT Long Term Goals - 01/18/16 1633      PT LONG TERM GOAL #1   Title Pt will be independent with HEP in order to indicate improved functional mobility.   (Target Date: 03/14/16)   Baseline dependent   Time 8   Period Weeks   Status New     PT LONG TERM GOAL #2   Title Pt will improve BERG balance test score 8 points from baseline in order to indicate decreased fall risk.     Baseline unable to assess due to time constraint   Time 8   Period Weeks   Status New     PT LONG TERM GOAL #3   Title Pt will increase gait speed to 2.18 ft/sec w/ LRAD in order to indicate decreased fall risk and improved efficiency of gait.     Baseline .98 ft/sec   Time 8   Period Weeks   Status New     PT LONG TERM GOAL #4   Title Pt will verbalize return to community fitness program in order to indicate ongoing work on functional endurance.     Baseline currently not involved in program.    Time 8   Period Weeks   Status New     PT LONG TERM GOAL #5   Title Pt will ambulate 500' w/ LRAD at mod I level over unlevel paved surfaces (including ramp/curb) in order to indicate safe return to community.     Baseline min/guard 100' w/ SPC over indoor surfaces   Time 8   Period Weeks   Status New               Plan - 02/12/16 1621    Clinical Impression Statement Met STG #4 for 6MWT. Pt progressed with intermittant UE support to no UE support with 4"step ups leading with each foot this session.  Continues to require periodic seated rest breaks during dynamic standing balance.   Rehab Potential Good   PT Frequency 2x / week   PT Duration 8 weeks   PT Treatment/Interventions ADLs/Self Care Home Management;Electrical Stimulation;DME Instruction;Gait training;Stair training;Functional mobility training;Therapeutic activities;Neuromuscular re-education;Balance training;Therapeutic exercise;Patient/family education;Orthotic Fit/Training;Vestibular   PT Next Visit Plan Check STGs, continue gait/RLE strengthening, high level balance activities   Consulted and Agree with Plan of Care Patient;Family member/caregiver   Family Member Consulted husband       Patient will benefit from skilled therapeutic intervention in order to improve the following deficits and impairments:  Abnormal  gait, Decreased activity tolerance, Decreased balance, Decreased mobility, Decreased strength, Dizziness, Impaired perceived functional ability, Impaired flexibility, Improper body mechanics, Postural dysfunction, Impaired UE functional use  Visit Diagnosis: 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 Obstructive sleep apnea 11/29/2015  . Right shoulder pain 11/29/2015  . Adjustment disorder with depressed mood 10/19/2015  . Nocturnal enuresis   . Hypokalemia   . Anemia, iron deficiency   . Super obese ([place])   . Prediabetes   . Orthostatic hypotension   . Supplemental oxygen dependent   . Diastolic dysfunction   . Tachypnea   . AKI (acute kidney injury) ([place])   . Acute blood loss anemia   . Lethargy   . Wheezing   . Benign essential HTN   . ICH (intracerebral hemorrhage) ([place]) 10/06/2015    Bjorn Loser, PTA  02/12/16, 4:27 PM [facility] [address] [place], [place], [address] Phone: [phone]   Fax:  [phone]  Name: [name] MRN: [id number] Date of Birth: 12-25-1960

## 2016-02-12 NOTE — Therapy (Signed)
[facility] [address] [place], [place], [address] Phone: [phone]   Fax:  [phone]  Occupational Therapy Treatment  Patient Details  Name: [name] MRN: [id number] Date of Birth: Oct 01, 1960 Referring Provider: Dr. Erlinda Hong  Encounter Date: 02/12/2016      OT End of Session - 02/12/16 1224    Visit Number 4   Number of Visits 17   Date for OT Re-Evaluation 03/17/16   Authorization Type self pay, mcd pending   Authorization Time Period pt is checking on when she will be approved, she may reduce frequency while she is self pay   OT Start Time 1150   OT Stop Time 1230   OT Time Calculation (min) 40 min      Past Medical History:  Diagnosis Date  . Headache   . Hypertension   . Iron deficiency anemia   . Seizures ([place])   . Stroke [facility])     Past Surgical History:  Procedure Laterality Date  . CARDIAC CATHETERIZATION  04/2010   Dr. Terrence Dupont    There were no vitals filed for this visit.        Treatment: supine closed chain shoulder flexion and chest press with ball, min facilitation/ v.c for proper positioning,  closed chain shoulder flexion seated for shoulder flexion mid range with ball min facilitation for shoulder/ scapular positioning.  Unilateral shoulder flexion to place large pegs in pegboard with RUE, min v.c./ facilitation to avoid compensatory positioning. Discussion with pt/ husband regarding medicaid approval and that pt is financially responsible until approved for Mcd. Pt/ husband to re-evaluate frequency of therapy.                      OT Short Term Goals - 02/05/16 1545      OT SHORT TERM GOAL #1   Title I with inital HEP. due 02/17/16   Baseline dependent   Time 4   Period Weeks   Status On-going     OT SHORT TERM GOAL #2   Title Pt will verbalize understanding of RUE positioning to minimize pain and risk for injury.   Baseline dependent   Time 4   Period Weeks   Status On-going     OT SHORT TERM GOAL #3   Title Pt will verbalize understanding of compensatory strategies for short term memory deficits.   Baseline dependent   Time 4   Period Weeks   Status On-going     OT SHORT TERM GOAL #4   Title Pt will demonstrate 90 shoulder flexion with pain less than or equal to 4/10 for functional reach.   Baseline 70*   Time 4   Period Weeks   Status On-going     OT SHORT TERM GOAL #5   Title Pt will perform dressing modified independently.   Baseline min A   Time 4   Period Weeks   Status On-going           OT Long Term Goals - 02/05/16 1545      OT LONG TERM GOAL #1   Title Pt will perform mod complex cooking/ home management at a modified indpendent level demonstrating good safety awareness. due 03/17/16   Baseline performing light home management and basic cooking with assistance   Time 8   Period Weeks   Status On-going     OT LONG TERM GOAL #2   Title Pt will demonstrate ability to perform tabletop and environmental  scanning tasks with 95% better accuracy and no significant diplopia.   Baseline Pt reports difficulty reading due to significant diplopia, pt does not perform effective environmental scanning due to c/o of diplopia   Time 8   Period Weeks   Status On-going     OT LONG TERM GOAL #3   Title Pt will resume use of RUE as a non dominant assist with ADLs/ IADLs at least 50% of the time with pain no greater than 3/10   Baseline unable to use RUE consistently  due to severe pain   Time 8   Period Weeks   Status On-going     OT LONG TERM GOAL #4   Title Pt will demonstrate improved fine motor coordiantion as evidenced by decreasing RUE 9 hole peg test score by 4 secs from eval.   Baseline to be assessed   Time 8   Period Weeks   Status On-going     OT LONG TERM GOAL #5   Title Pt will demonstrate ability to perform  a physical and cognitive task simultaneously with 90% or better accuracy.   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 Obstructive sleep apnea 11/29/2015  . Right shoulder pain 11/29/2015  . Adjustment disorder with depressed mood 10/19/2015  . Nocturnal enuresis   . Hypokalemia   . Anemia, iron deficiency   . Super obese ([place])   . Prediabetes   . Orthostatic hypotension   . Supplemental oxygen dependent   . Diastolic dysfunction   . Tachypnea   . AKI (acute kidney injury) ([place])   . Acute blood loss anemia   . Lethargy   . Wheezing   . Benign essential HTN   . ICH (intracerebral hemorrhage) ([place]) 10/06/2015    RINE,KATHRYN 02/12/2016, 12:25 PM  [facility] [address] [place], [place], [address] Phone: [phone]   Fax:  [phone]  Name: GIZEL FILLER MRN: [id number] Date of Birth: 09-Jun-1960

## 2016-02-13 ENCOUNTER — Ambulatory Visit: Payer: Self-pay | Admitting: Internal Medicine

## 2016-02-13 ENCOUNTER — Ambulatory Visit: Payer: Medicaid Other | Admitting: Physical Therapy

## 2016-02-13 ENCOUNTER — Ambulatory Visit: Payer: Medicaid Other | Admitting: Occupational Therapy

## 2016-02-19 ENCOUNTER — Other Ambulatory Visit: Payer: Self-pay | Admitting: Internal Medicine

## 2016-02-19 ENCOUNTER — Encounter: Payer: Self-pay | Admitting: Physical Therapy

## 2016-02-19 ENCOUNTER — Encounter: Payer: Self-pay | Admitting: Occupational Therapy

## 2016-02-19 ENCOUNTER — Ambulatory Visit: Payer: Medicaid Other | Admitting: Occupational Therapy

## 2016-02-19 ENCOUNTER — Ambulatory Visit: Payer: Medicaid Other

## 2016-02-19 ENCOUNTER — Ambulatory Visit: Payer: Medicaid Other | Admitting: Physical Therapy

## 2016-02-19 DIAGNOSIS — M6281 Muscle weakness (generalized): Secondary | ICD-10-CM

## 2016-02-19 DIAGNOSIS — I69253 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 non-dominant side: Secondary | ICD-10-CM | POA: Diagnosis not present

## 2016-02-19 DIAGNOSIS — R4701 Aphasia: Secondary | ICD-10-CM

## 2016-02-19 DIAGNOSIS — R471 Dysarthria and anarthria: Secondary | ICD-10-CM

## 2016-02-19 DIAGNOSIS — M25511 Pain in right shoulder: Secondary | ICD-10-CM

## 2016-02-19 DIAGNOSIS — R2681 Unsteadiness on feet: Secondary | ICD-10-CM

## 2016-02-19 DIAGNOSIS — R2689 Other abnormalities of gait and mobility: Secondary | ICD-10-CM

## 2016-02-19 DIAGNOSIS — R41841 Cognitive communication deficit: Secondary | ICD-10-CM

## 2016-02-19 DIAGNOSIS — R278 Other lack of coordination: Secondary | ICD-10-CM

## 2016-02-19 MED FILL — ?AMLODIPINE BESYLATE 10 MG: 10 | 30 days supply | Qty: 30 | Fill #0

## 2016-02-19 NOTE — Therapy (Signed)
[facility] [address] [place], [place], [address] Phone: [phone]   Fax:  [phone]  Physical Therapy Treatment  Patient Details  Name: [name] MRN: [id number] Date of Birth: 1961-02-17 Referring Provider: Rosalin Hawking, MD  Encounter Date: 02/19/2016      PT End of Session - 02/19/16 1319    Visit Number 8   Number of Visits 17   Date for PT Re-Evaluation 03/18/16   Authorization Type Possible MCD-waiting to get number from pt (understands that she is self pay until then)   PT Start Time 1230   PT Stop Time 1315   PT Time Calculation (min) 45 min   Equipment Utilized During Treatment Gait belt   Activity Tolerance Patient tolerated treatment well   Behavior During Therapy [facility] for tasks assessed/performed      Past Medical History:  Diagnosis Date  . Headache   . Hypertension   . Iron deficiency anemia   . Seizures ([place])   . Stroke [facility])     Past Surgical History:  Procedure Laterality Date  . CARDIAC CATHETERIZATION  04/2010   Dr. Terrence Dupont    There were no vitals filed for this visit.      Subjective Assessment - 02/19/16 1235    Patient is accompained by: Family member   Limitations House hold activities;Walking   Pain Score 5    Pain Location Shoulder   Pain Orientation Right   Pain Type Chronic pain   Pain Radiating Towards down to forearm   Pain Onset More than a month ago           [facility] PT Assessment - 02/19/16 1245      Berg Balance Test   Sit to Stand Able to stand  independently using hands   Standing Unsupported Able to stand safely 2 minutes   Sitting with Back Unsupported but Feet Supported on Floor or Stool Able to sit safely and securely 2 minutes   Stand to Sit Controls descent by using hands   Transfers Able to transfer safely, definite need of hands   Standing Unsupported with Eyes Closed Able to stand 10 seconds safely   Standing Ubsupported with Feet  Together Able to place feet together independently and stand for 1 minute with supervision  cannot get feet completely together   From Standing, Reach Forward with Outstretched Arm Can reach forward >12 cm safely (5")  5"   From Standing Position, Pick up Object from Floor Unable to pick up and needs supervision   From Standing Position, Turn to Look Behind Over each Shoulder Needs supervision when turning   Turn 360 Degrees Needs close supervision or verbal cueing   Standing Unsupported, Alternately Place Feet on Step/Stool Able to complete 4 steps without aid or supervision   Standing Unsupported, One Foot in Front Able to plae foot ahead of the other independently and hold 30 seconds   Standing on One Leg Tries to lift leg/unable to hold 3 seconds but remains standing independently   Total Score 36           [facility] Adult PT Treatment/Exercise - 02/19/16 1326      Ambulation/Gait   Ambulation/Gait Yes   Ambulation/Gait Assistance 4: Min guard   Ambulation/Gait Assistance Details VC for increased speed and step length, and to correct anterior pelvic tilt during gait.   Ambulation Distance (Feet) 345 Feet   Assistive device Straight cane   Gait Pattern Step-through pattern;Decreased stride length;Narrow  base of support;Decreased arm swing - right;Decreased arm swing - left  pelvis anteriorly tilted   Ambulation Surface Level;Indoor   Gait velocity 1.22           PT Short Term Goals - 02/19/16 1237      PT SHORT TERM GOAL #1   Title Pt will initiate HEP in order to indicate improved functional mobility.  (Target Date: 02/15/16)   Baseline 02/19/2016 Met   Time --   Period --   Status Achieved     PT SHORT TERM GOAL #2   Title Will assess BERG balance test and improve score by 4 points in order to indicate decreased fall risk.     Baseline 02/19/16 Scored 36 today   Time --   Period --   Status Achieved     PT SHORT TERM GOAL #3   Title Pt will improve gait speed to  1.58 ft/sec in order to indicate decreased fall risk and improved efficiency of gait.     Baseline 11/27- improved to 1.22 ft/sec, not to goal   Time --   Period --   Status Partially Met     PT SHORT TERM GOAL #4   Title Will assess 6MWT and improve distance by 150' in order to indicate improved functional endurance.     Baseline 02/12/16 Pt ambulated 500 ft during 6MWT   Time --   Period --   Status Achieved     PT SHORT TERM GOAL #5   Title Pt will ambulate 300' w/ LRAD at mod I level over indoor surfaces in order to indicate safe home negotiation.     Baseline 11/27: 335 w/ SPC at min guard   Time 4   Period --   Status Partially Met           PT Long Term Goals - 01/18/16 1633      PT LONG TERM GOAL #1   Title Pt will be independent with HEP in order to indicate improved functional mobility.  (Target Date: 03/14/16)   Baseline dependent   Time 8   Period Weeks   Status New     PT LONG TERM GOAL #2   Title Pt will improve BERG balance test score 8 points from baseline in order to indicate decreased fall risk.     Baseline unable to assess due to time constraint   Time 8   Period Weeks   Status New     PT LONG TERM GOAL #3   Title Pt will increase gait speed to 2.18 ft/sec w/ LRAD in order to indicate decreased fall risk and improved efficiency of gait.     Baseline .98 ft/sec   Time 8   Period Weeks   Status New     PT LONG TERM GOAL #4   Title Pt will verbalize return to community fitness program in order to indicate ongoing work on functional endurance.     Baseline currently not involved in program.    Time 8   Period Weeks   Status New     PT LONG TERM GOAL #5   Title Pt will ambulate 500' w/ LRAD at mod I level over unlevel paved surfaces (including ramp/curb) in order to indicate safe return to community.     Baseline min/guard 100' w/ SPC over indoor surfaces   Time 8   Period Weeks   Status New           Plan - 02/19/16  1320    Clinical  Impression Statement Today's skilled session focused on checking short-term goals. Patient has achieved 3 of 5 STGs and has made progress towards remaining STGs. She should benefit from continued PT to achieve unmet goals.   Rehab Potential Good   Clinical Impairments Affecting Rehab Potential possible visit limitations   PT Frequency 2x / week   PT Duration 8 weeks   PT Treatment/Interventions ADLs/Self Care Home Management;Electrical Stimulation;DME Instruction;Gait training;Stair training;Functional mobility training;Therapeutic activities;Neuromuscular re-education;Balance training;Therapeutic exercise;Patient/family education;Orthotic Fit/Training;Vestibular   PT Next Visit Plan continue gait/RLE strengthening, high level balance activities, proper technique for picking up objects off floor.   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 Obstructive sleep apnea 11/29/2015  . Right shoulder pain 11/29/2015  . Adjustment disorder with depressed mood 10/19/2015  . Nocturnal enuresis   . Hypokalemia   . Anemia, iron deficiency   . Super obese ([place])   . Prediabetes   . Orthostatic hypotension   . Supplemental oxygen dependent   . Diastolic dysfunction    . Tachypnea   . AKI (acute kidney injury) ([place])   . Acute blood loss anemia   . Lethargy   . Wheezing   . Benign essential HTN   . ICH (intracerebral hemorrhage) ([place]) 10/06/2015    Benjiman Core, SPTA 02/19/2016, 3:52 PM  [facility] [address] [place], [place], [address] Phone: [phone]   Fax:  [phone]  Name: BILAN TEDESCO MRN: [id number] Date of Birth: Dec 31, 1960

## 2016-02-19 NOTE — Therapy (Signed)
[facility] [address] [place], [place], [address] Phone: [phone]   Fax:  [phone]  Speech Language Pathology Treatment  Patient Details  Name: [name] MRN: [id number] Date of Birth: 1960-09-27 Referring Provider: Debbe Odea MD  Encounter Date: 02/19/2016      End of Session - 02/19/16 1718    Visit Number 6   Number of Visits 17   Date for SLP Re-Evaluation 04/05/16   SLP Start Time 1149   SLP Stop Time  1230   SLP Time Calculation (min) 41 min   Activity Tolerance Patient tolerated treatment well      Past Medical History:  Diagnosis Date  . Headache   . Hypertension   . Iron deficiency anemia   . Seizures ([place])   . Stroke [facility])     Past Surgical History:  Procedure Laterality Date  . CARDIAC CATHETERIZATION  04/2010   Dr. Terrence Dupont    There were no vitals filed for this visit.             ADULT SLP TREATMENT - 02/19/16 1407      General Information   Behavior/Cognition Alert;Cooperative;Pleasant mood     Treatment Provided   Treatment provided Cognitive-Linquistic     Cognitive-Linquistic Treatment   Treatment focused on Aphasia;Dysarthria   Skilled Treatment SLP worked with pt on her dysarthria by having her improve/increase her effort level when talking, to at least 70dB in sentence responses. Pt req'd usual min cues faded to occasional min verbal cues in order to maintain loudness at a WNL level. She req'd min verbal cues for naming in mod complex categories.             SLP Short Term Goals - 02/19/16 1219      SLP SHORT TERM GOAL #1   Title pt will incr speech volume to average 69dB for 18/20 sentence responses over three sessions   Baseline adequate vocal intensity for quiet environment   Time 1   Status On-going     SLP SHORT TERM GOAL #2   Title pt will name 8-10 items in mod complex category with modified independence over three sessions   Time 1   Period Weeks   Status On-going     SLP SHORT TERM GOAL #3   Title pt will demo [facility] 5 minutes mod-complex conversation with rare min A over two sessions   Period Weeks   Status Achieved     SLP SHORT TERM GOAL #4   Title Pt to complete moderately complex reasoning tasks at mod I level and >80% acc over 2 sessions.   Time 1   Period Weeks   Status On-going          SLP Long Term Goals - 02/19/16 1218      SLP LONG TERM GOAL #1   Title pt will demo WFL 10 minutes mod complex conversation with modified independence (using compensations) in three sessions   Baseline 15 minute conversation wtih independent use of strategies to compensate for word finding errors   Time 5   Period Weeks   Status On-going     SLP LONG TERM GOAL #2   Title pt will name at least average of 6 items in complex/abstract category over three sessions   Time 5   Period Weeks   Status On-going     SLP LONG TERM GOAL #3   Title pt will incr speech volume in 10 minutes mod complex  conversation to average 70dB over three sessions   Baseline 15 minute conversation with adequate vocal intensity in quiet environment.   Time 5   Period Weeks   Status On-going          Plan - 02/19/16 1719    Clinical Impression Statement Verbal cues from SLP assisted pt in incr'ing her loudness in structured tasks to wNL. Pt again req'd usual SLP verbal cues when working with linguistic reasoning task/wordfinding task. Skilled ST remains necessary to improve cognitive-linguistic skills back to as close to baseline as possible.    Speech Therapy Frequency 2x / week   Duration --  5 weeks   Treatment/Interventions Compensatory strategies;Functional tasks;Cueing hierarchy;Internal/external aids;Multimodal communcation approach;SLP instruction and feedback;Language facilitation;Patient/family education;Cognitive reorganization   Potential to Achieve Goals Good      Patient will benefit from skilled therapeutic intervention  in order to improve the following deficits and impairments:   Aphasia  Dysarthria and anarthria  Cognitive communication deficit    Problem List Patient Active Problem List   Diagnosis Date Noted  . Obstructive sleep apnea 11/29/2015  . Right shoulder pain 11/29/2015  . Adjustment disorder with depressed mood 10/19/2015  . Nocturnal enuresis   . Hypokalemia   . Anemia, iron deficiency   . Super obese ([place])   . Prediabetes   . Orthostatic hypotension   . Supplemental oxygen dependent   . Diastolic dysfunction   . Tachypnea   . AKI (acute kidney injury) ([place])   . Acute blood loss anemia   . Lethargy   . Wheezing   . Benign essential HTN   . ICH (intracerebral hemorrhage) ([place]) 10/06/2015    [facility] ,[place], CCC-SLP  02/19/2016, 5:22 PM  [facility] [address] [place], [place], [address] Phone: [phone]   Fax:  [phone]   Name: [name] MRN: [id number] Date of Birth: 1960-04-08

## 2016-02-19 NOTE — Therapy (Signed)
[facility] [address] [place], [place], [address] Phone: [phone]   Fax:  [phone]  Occupational Therapy Treatment  Patient Details  Name: [name] MRN: [id number] Date of Birth: 1960/09/02 Referring Provider: Dr. Erlinda Hong  Encounter Date: 02/19/2016      OT End of Session - 02/19/16 1254    Visit Number 5   Number of Visits 17   Date for OT Re-Evaluation 03/17/16   Authorization Type self pay, mcd pending   Authorization Time Period pt is checking on when she will be approved, she may reduce frequency while she is self pay   OT Start Time 1105   OT Stop Time 1144   OT Time Calculation (min) 39 min   Activity Tolerance Patient tolerated treatment well      Past Medical History:  Diagnosis Date  . Headache   . Hypertension   . Iron deficiency anemia   . Seizures ([place])   . Stroke [facility])     Past Surgical History:  Procedure Laterality Date  . CARDIAC CATHETERIZATION  04/2010   Dr. Terrence Dupont    There were no vitals filed for this visit.      Subjective Assessment - 02/19/16 1108    Subjective  I guess I want to go to one time a week as this will get expensive   Pertinent History see epic - pt with significant shoulder pain   Patient Stated Goals reduce pain and be able to move right arm   Currently in Pain? Yes   Pain Score 5    Pain Location Shoulder   Pain Orientation Right   Pain Descriptors / Indicators Tightness   Pain Type Chronic pain   Pain Onset More than a month ago   Pain Frequency Intermittent   Aggravating Factors  malpositioning   Pain Relieving Factors repositioning                      OT Treatments/Exercises (OP) - 02/19/16 0001      Neurological Re-education Exercises   Other Exercises 1 Neuro re ed to address RUE mid to overhead reach in supine initially in closed chain then progressing to open chain unilateral reach. Transitioned into sitting to address  mid to beginning high unilateral reach. Pt able to achieve 85* of shoulder flexion with very minimal compensations - pt's performance improves with distraction. Pt reporting 4/10 pain in upper arm.                   OT Short Term Goals - 02/19/16 1252      OT SHORT TERM GOAL #1   Title I with inital HEP. due 02/17/16   Baseline dependent   Time 4   Period Weeks   Status On-going     OT SHORT TERM GOAL #2   Title Pt will verbalize understanding of RUE positioning to minimize pain and risk for injury.   Baseline dependent   Time 4   Period Weeks   Status Achieved     OT SHORT TERM GOAL #3   Title Pt will verbalize understanding of compensatory strategies for short term memory deficits.   Baseline dependent   Time 4   Period Weeks   Status On-going     OT SHORT TERM GOAL #4   Title Pt will demonstrate 90 shoulder flexion with pain less than or equal to 4/10 for functional reach.   Baseline 70*   Time 4  Period Weeks   Status On-going     OT SHORT TERM GOAL #5   Title Pt will perform dressing modified independently.   Baseline min A   Time 4   Period Weeks   Status Achieved           OT Long Term Goals - 02/19/16 1252      OT LONG TERM GOAL #1   Title Pt will perform mod complex cooking/ home management at a modified indpendent level demonstrating good safety awareness. due 03/17/16   Baseline performing light home management and basic cooking with assistance   Time 8   Period Weeks   Status On-going     OT LONG TERM GOAL #2   Title Pt will demonstrate ability to perform tabletop and environmental scanning tasks with 95% better accuracy and no significant diplopia.   Baseline Pt reports difficulty reading due to significant diplopia, pt does not perform effective environmental scanning due to c/o of diplopia   Time 8   Period Weeks   Status On-going     OT LONG TERM GOAL #3   Title Pt will resume use of RUE as a non dominant assist with ADLs/  IADLs at least 50% of the time with pain no greater than 3/10   Baseline unable to use RUE consistently  due to severe pain   Time 8   Period Weeks   Status On-going     OT LONG TERM GOAL #4   Title Pt will demonstrate improved fine motor coordiantion as evidenced by decreasing RUE 9 hole peg test score by 4 secs from eval.   Baseline to be assessed   Time 8   Period Weeks   Status On-going     OT LONG TERM GOAL #5   Title Pt will demonstrate ability to perform  a physical and cognitive task simultaneously with 90% or better accuracy.   Baseline to be further assessed, impaired attention   Time 8   Period Weeks   Status On-going               Plan - 02/19/16 1252    Clinical Impression Statement Pt progressing with RUE functional use.  Pt has decided to reduce frequency for all therapies to 1 x/wk due to awaiting Medicaid approval and fearful of potential expenses.    Rehab Potential Good   OT Frequency 1x / week   OT Duration 8 weeks   OT Treatment/Interventions Self-care/ADL training;Fluidtherapy;Moist Heat;DME and/or AE instruction;Splinting;Patient/family education;Balance training;Therapeutic exercises;Therapeutic Higher education careers adviser;Therapeutic exercise;Ultrasound;Cryotherapy;Iontophoresis;Neuromuscular education;Functional Mobility Training;Passive range of motion;Cognitive remediation/compensation;Visual/perceptual remediation/compensation;Manual Therapy;Electrical Stimulation;Parrafin   Plan URE functional use, neuro re ed   Consulted and Agree with Plan of Care Patient      Patient will benefit from skilled therapeutic intervention in order to improve the following deficits and impairments:  Abnormal gait, Decreased coordination, Decreased range of motion, Impaired sensation, Increased edema, Decreased safety awareness, Decreased endurance, Decreased activity tolerance, Decreased knowledge of precautions, Pain, Impaired UE functional use, Decreased balance,  Decreased cognition, Decreased mobility, Decreased strength, Impaired vision/preception, Impaired perceived functional ability  Visit Diagnosis: Muscle weakness (generalized)  Other lack of coordination  Acute pain of right shoulder  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 Obstructive sleep apnea 11/29/2015  . Right shoulder pain 11/29/2015  . Adjustment disorder with depressed mood 10/19/2015  . Nocturnal enuresis   .  Hypokalemia   . Anemia, iron deficiency   . Super obese ([place])   . Prediabetes   . Orthostatic hypotension   . Supplemental oxygen dependent   . Diastolic dysfunction   . Tachypnea   . AKI (acute kidney injury) ([place])   . Acute blood loss anemia   . Lethargy   . Wheezing   . Benign essential HTN   . ICH (intracerebral hemorrhage) ([place]) 10/06/2015    Quay Burow, OTR/L 02/19/2016, 12:56 PM  [facility] [address] [place], [place], [address] Phone: [phone]   Fax:  [phone]  Name: [name] MRN: [id number] Date of Birth: Jan 01, 1961

## 2016-02-20 ENCOUNTER — Ambulatory Visit: Payer: Medicaid Other | Attending: Internal Medicine | Admitting: Internal Medicine

## 2016-02-20 ENCOUNTER — Encounter: Payer: Self-pay | Admitting: Internal Medicine

## 2016-02-20 VITALS — BP 129/84 | HR 74 | Temp 98.1°F | Resp 16 | Wt 264.8 lb

## 2016-02-20 DIAGNOSIS — G4733 Obstructive sleep apnea (adult) (pediatric): Secondary | ICD-10-CM

## 2016-02-20 DIAGNOSIS — I639 Cerebral infarction, unspecified: Secondary | ICD-10-CM

## 2016-02-20 DIAGNOSIS — K529 Noninfective gastroenteritis and colitis, unspecified: Secondary | ICD-10-CM | POA: Insufficient documentation

## 2016-02-20 DIAGNOSIS — E669 Obesity, unspecified: Secondary | ICD-10-CM | POA: Diagnosis not present

## 2016-02-20 DIAGNOSIS — I10 Essential (primary) hypertension: Secondary | ICD-10-CM | POA: Diagnosis not present

## 2016-02-20 DIAGNOSIS — R7303 Prediabetes: Secondary | ICD-10-CM | POA: Diagnosis not present

## 2016-02-20 DIAGNOSIS — Z79899 Other long term (current) drug therapy: Secondary | ICD-10-CM | POA: Diagnosis not present

## 2016-02-20 DIAGNOSIS — Z114 Encounter for screening for human immunodeficiency virus [HIV]: Secondary | ICD-10-CM | POA: Insufficient documentation

## 2016-02-20 DIAGNOSIS — D509 Iron deficiency anemia, unspecified: Secondary | ICD-10-CM | POA: Diagnosis not present

## 2016-02-20 DIAGNOSIS — Z23 Encounter for immunization: Secondary | ICD-10-CM

## 2016-02-20 DIAGNOSIS — Z1159 Encounter for screening for other viral diseases: Secondary | ICD-10-CM

## 2016-02-20 DIAGNOSIS — Z7984 Long term (current) use of oral hypoglycemic drugs: Secondary | ICD-10-CM | POA: Diagnosis not present

## 2016-02-20 LAB — GLUCOSE, POCT (MANUAL RESULT ENTRY): POC Glucose: 120 mg/dl — AB (ref 70–99)

## 2016-02-20 LAB — POCT GLYCOSYLATED HEMOGLOBIN (HGB A1C): Hemoglobin A1C: 5.8

## 2016-02-20 MED ORDER — METOPROLOL TARTRATE 50 MG PO TABS: 50.0000 mg | ORAL_TABLET | Freq: Two times a day (BID) | ORAL | 3 refills | Status: DC

## 2016-02-20 MED ORDER — CHLORTHALIDONE 25 MG PO TABS: 25.0000 mg | ORAL_TABLET | Freq: Every day | ORAL | 3 refills | Status: DC

## 2016-02-20 MED ORDER — AMLODIPINE BESYLATE 10 MG PO TABS: 10.0000 mg | ORAL_TABLET | Freq: Every day | ORAL | 3 refills | Status: DC

## 2016-02-20 MED ORDER — LEVETIRACETAM 750 MG PO TABS: 750.0000 mg | ORAL_TABLET | Freq: Two times a day (BID) | ORAL | 2 refills | Status: DC

## 2016-02-20 MED ORDER — METFORMIN HCL ER 500 MG PO TB24: 500.0000 mg | ORAL_TABLET | Freq: Every day | ORAL | 3 refills | Status: DC

## 2016-02-20 MED FILL — ?AMLODIPINE BESYLATE 10 MG: 10 | 30 days supply | Qty: 30 | Fill #0

## 2016-02-20 MED FILL — CHLORTHALIDONE 25 MG TABLET: 25 | 30 days supply | Qty: 30 | Fill #0

## 2016-02-20 MED FILL — METFORMIN HCL ER 500 MG TAB: 500 | 30 days supply | Qty: 30 | Fill #0

## 2016-02-20 MED FILL — levETIRAcetam 750 MG TABS: 750 | 30 days supply | Qty: 60 | Fill #0

## 2016-02-20 MED FILL — METOPROLOL TARTRATE 50 MG T: 50 | 30 days supply | Qty: 60 | Fill #0

## 2016-02-20 NOTE — Progress Notes (Signed)
Pt pain level is a 4 Pt states pain is coming from right shoulder Pt states her shoulder has been hurting since she was in the hospital

## 2016-02-20 NOTE — Patient Instructions (Addendum)
Td Vaccine (Tetanus and Diphtheria): What You Need to Know 1. Why get vaccinated? Tetanus  and diphtheria are very serious diseases. They are rare in the [place] today, but people who do become infected often have severe complications. Td vaccine is used to protect adolescents and adults from both of these diseases. Both tetanus and diphtheria are infections caused by bacteria. Diphtheria spreads from person to person through coughing or sneezing. Tetanus-causing bacteria enter the body through cuts, scratches, or wounds. TETANUS (lockjaw) causes painful muscle tightening and stiffness, usually all over the body.  It can lead to tightening of muscles in the head and neck so you can't open your mouth, swallow, or sometimes even breathe. Tetanus kills about 1 out of every 10 people who are infected even after receiving the best medical care. DIPHTHERIA can cause a thick coating to form in the back of the throat.  It can lead to breathing problems, paralysis, heart failure, and death. Before vaccines, as many as 200,000 cases of diphtheria and hundreds of cases of tetanus were reported in the [place] each year. Since vaccination began, reports of cases for both diseases have dropped by about 99%. 2. Td vaccine Td vaccine can protect adolescents and adults from tetanus and diphtheria. Td is usually given as a booster dose every 10 years but it can also be given earlier after a severe and dirty wound or burn. Another vaccine, called Tdap, which protects against pertussis in addition to tetanus and diphtheria, is sometimes recommended instead of Td vaccine. Your doctor or the person giving you the vaccine can give you more information. Td may safely be given at the same time as other vaccines. 3. Some people should not get this vaccine  A person who has ever had a life-threatening allergic reaction after a previous dose of any tetanus or diphtheria containing vaccine, OR has a severe allergy  to any part of this vaccine, should not get Td vaccine. Tell the person giving the vaccine about any severe allergies.  Talk to your doctor if you:  had severe pain or swelling after any vaccine containing diphtheria or tetanus,  ever had a condition called Guillain Barre Syndrome (GBS),  aren't feeling well on the day the shot is scheduled. 4. What are the risks from Td vaccine? With any medicine, including vaccines, there is a chance of side effects. These are usually mild and go away on their own. Serious reactions are also possible but are rare. Most people who get Td vaccine do not have any problems with it. Mild problems following Td vaccine: (Did not interfere with activities)  Pain where the shot was given (about 8 people in 10)  Redness or swelling where the shot was given (about 1 person in 4)  Mild fever (rare)  Headache (about 1 person in 4)  Tiredness (about 1 person in 4) Moderate problems following Td vaccine: (Interfered with activities, but did not require medical attention)  Fever over 102F (rare) Severe problems following Td vaccine: (Unable to perform usual activities; required medical attention)  Swelling, severe pain, bleeding and/or redness in the arm where the shot was given (rare). Problems that could happen after any vaccine:  People sometimes faint after a medical procedure, including vaccination. Sitting or lying down for about 15 minutes can help prevent fainting, and injuries caused by a fall. Tell your doctor if you feel dizzy, or have vision changes or ringing in the ears.  Some people get severe pain in the shoulder  and have difficulty moving the arm where a shot was given. This happens very rarely.  Any medication can cause a severe allergic reaction. Such reactions from a vaccine are very rare, estimated at fewer than 1 in a million doses, and would happen within a few minutes to a few hours after the vaccination. As with any medicine, there  is a very remote chance of a vaccine causing a serious injury or death. The safety of vaccines is always being monitored. For more information, visit: http://www.aguilar.org/ 5. What if there is a serious reaction? What should I look for? Look for anything that concerns you, such as signs of a severe allergic reaction, very high fever, or unusual behavior. Signs of a severe allergic reaction can include hives, swelling of the face and throat, difficulty breathing, a fast heartbeat, dizziness, and weakness. These would usually start a few minutes to a few hours after the vaccination. What should I do?  If you think it is a severe allergic reaction or other emergency that can't wait, call 9-1-1 or get the person to the nearest hospital. Otherwise, call your doctor.  Afterward, the reaction should be reported to the Vaccine Adverse Event Reporting System (VAERS). Your doctor might file this report, or you can do it yourself through the VAERS web site at www.vaers.SamedayNews.es, or by calling 731-470-6710.  VAERS does not give medical advice. 6. The National Vaccine Injury Compensation Program The [employer] Vaccine Injury Compensation Program (VICP) is a federal program that was created to compensate people who may have been injured by certain vaccines. Persons who believe they may have been injured by a vaccine can learn about the program and about filing a claim by calling 8250254281 or visiting the [place] website at GoldCloset.com.ee. There is a time limit to file a claim for compensation. 7. How can I learn more?  Ask your doctor. He or she can give you the vaccine package insert or suggest other sources of information.  Call your local or state health department.  Contact the Centers for Disease Control and Prevention (CDC):  Call 930-477-8497 (1-800-CDC-INFO)  Visit CDC's website at http://hunter.com/ CDC Td Vaccine VIS (07/04/15) This information is not intended to  replace advice given to you by your health care provider. Make sure you discuss any questions you have with your health care provider. Document Released: 01/06/2006 Document Revised: 11/30/2015 Document Reviewed: 11/30/2015 Elsevier Interactive Patient Education  2017 Elsevier Inc.   -  Low-Sodium Eating Plan Sodium raises blood pressure and causes water to be held in the body. Getting less sodium from food will help lower your blood pressure, reduce any swelling, and protect your heart, liver, and kidneys. We get sodium by adding salt (sodium chloride) to food. Most of our sodium comes from canned, boxed, and frozen foods. Restaurant foods, fast foods, and pizza are also very high in sodium. Even if you take medicine to lower your blood pressure or to reduce fluid in your body, getting less sodium from your food is important. What is my plan? Most people should limit their sodium intake to 2,300 mg a day. Your health care provider recommends that you limit your sodium intake to 2,000 mg a day. What do I need to know about this eating plan? For the low-sodium eating plan, you will follow these general guidelines:  Choose foods with a % Daily Value for sodium of less than 5% (as listed on the food label).  Use salt-free seasonings or herbs instead of table salt or  sea salt.  Check with your health care provider or pharmacist before using salt substitutes.  Eat fresh foods.  Eat more vegetables and fruits.  Limit canned vegetables. If you do use them, rinse them well to decrease the sodium.  Limit cheese to 1 oz (28 g) per day.  Eat lower-sodium products, often labeled as "lower sodium" or "no salt added."  Avoid foods that contain monosodium glutamate (MSG). MSG is sometimes added to [place] food and some canned foods.  Check food labels (Nutrition Facts labels) on foods to learn how much sodium is in one serving.  Eat more home-cooked food and less restaurant, buffet, and fast  food.  When eating at a restaurant, ask that your food be prepared with less salt, or no salt if possible. How do I read food labels for sodium information? The Nutrition Facts label lists the amount of sodium in one serving of the food. If you eat more than one serving, you must multiply the listed amount of sodium by the number of servings. Food labels may also identify foods as:  Sodium free-Less than 5 mg in a serving.  Very low sodium-35 mg or less in a serving.  Low sodium-140 mg or less in a serving.  Light in sodium-50% less sodium in a serving. For example, if a food that usually has 300 mg of sodium is changed to become light in sodium, it will have 150 mg of sodium.  Reduced sodium-25% less sodium in a serving. For example, if a food that usually has 400 mg of sodium is changed to reduced sodium, it will have 300 mg of sodium. What foods can I eat? Grains  Low-sodium cereals, including oats, puffed wheat and rice, and shredded wheat cereals. Low-sodium crackers. Unsalted rice and pasta. Lower-sodium bread. Vegetables  Frozen or fresh vegetables. Low-sodium or reduced-sodium canned vegetables. Low-sodium or reduced-sodium tomato sauce and paste. Low-sodium or reduced-sodium tomato and vegetable juices. Fruits  Fresh, frozen, and canned fruit. Fruit juice. Meat and Other Protein Products  Low-sodium canned tuna and salmon. Fresh or frozen meat, poultry, seafood, and fish. Lamb. Unsalted nuts. Dried beans, peas, and lentils without added salt. Unsalted canned beans. Homemade soups without salt. Eggs. Dairy  Milk. Soy milk. Ricotta cheese. Low-sodium or reduced-sodium cheeses. Yogurt. Condiments  Fresh and dried herbs and spices. Salt-free seasonings. Onion and garlic powders. Low-sodium varieties of mustard and ketchup. Fresh or refrigerated horseradish. Lemon juice. Fats and Oils  Reduced-sodium salad dressings. Unsalted butter. Other  Unsalted popcorn and pretzels. The  items listed above may not be a complete list of recommended foods or beverages. Contact your dietitian for more options.  What foods are not recommended? Grains  Instant hot cereals. Bread stuffing, pancake, and biscuit mixes. Croutons. Seasoned rice or pasta mixes. Noodle soup cups. Boxed or frozen macaroni and cheese. Self-rising flour. Regular salted crackers. Vegetables  Regular canned vegetables. Regular canned tomato sauce and paste. Regular tomato and vegetable juices. Frozen vegetables in sauces. Salted [place] fries. Olives. Angie Fava. Relishes. Sauerkraut. Salsa. Meat and Other Protein Products  Salted, canned, smoked, spiced, or pickled meats, seafood, or fish. Bacon, ham, sausage, hot dogs, corned beef, chipped beef, and packaged luncheon meats. Salt pork. Jerky. Pickled herring. Anchovies, regular canned tuna, and sardines. Salted nuts. Dairy  Processed cheese and cheese spreads. Cheese curds. Blue cheese and cottage cheese. Buttermilk. Condiments  Onion and garlic salt, seasoned salt, table salt, and sea salt. Canned and packaged gravies. Worcestershire sauce. Tartar sauce. Barbecue sauce. Teriyaki  sauce. Soy sauce, including reduced sodium. Steak sauce. Fish sauce. Oyster sauce. Cocktail sauce. Horseradish that you find on the shelf. Regular ketchup and mustard. Meat flavorings and tenderizers. Bouillon cubes. Hot sauce. Tabasco sauce. Marinades. Taco seasonings. Relishes. Fats and Oils  Regular salad dressings. Salted butter. Margarine. Ghee. Bacon fat. Other  Potato and tortilla chips. Corn chips and puffs. Salted popcorn and pretzels. Canned or dried soups. Pizza. Frozen entrees and pot pies. The items listed above may not be a complete list of foods and beverages to avoid. Contact your dietitian for more information.  This information is not intended to replace advice given to you by your health care provider. Make sure you discuss any questions you have with your health care  provider. Document Released: 08/31/2001 Document Revised: 08/17/2015 Document Reviewed: 01/13/2013 Elsevier Interactive Patient Education  2017 Elsevier Inc.  -  Hypertension Hypertension is another name for high blood pressure. High blood pressure forces your heart to work harder to pump blood. A blood pressure reading has two numbers, which includes a higher number over a lower number (example: 110/72). Follow these instructions at home:  Have your blood pressure rechecked by your doctor.  Only take medicine as told by your doctor. Follow the directions carefully. The medicine does not work as well if you skip doses. Skipping doses also puts you at risk for problems.  Do not smoke.  Monitor your blood pressure at home as told by your doctor. Contact a doctor if:  You think you are having a reaction to the medicine you are taking.  You have repeat headaches or feel dizzy.  You have puffiness (swelling) in your ankles.  You have trouble with your vision. Get help right away if:  You get a very bad headache and are confused.  You feel weak, numb, or faint.  You get chest or belly (abdominal) pain.  You throw up (vomit).  You cannot breathe very well. This information is not intended to replace advice given to you by your health care provider. Make sure you discuss any questions you have with your health care provider. Document Released: 08/28/2007 Document Revised: 08/17/2015 Document Reviewed: 01/01/2013 Elsevier Interactive Patient Education  2017 [place] Maintenance for Postmenopausal Women Introduction Menopause is a normal process in which your reproductive ability comes to an end. This process happens gradually over a span of months to years, usually between the ages of 69 and 35. Menopause is complete when you have missed 12 consecutive menstrual periods. It is important to talk with your health care provider about some of the most common  conditions that affect postmenopausal women, such as heart disease, cancer, and bone loss (osteoporosis). Adopting a healthy lifestyle and getting preventive care can help to promote your health and wellness. Those actions can also lower your chances of developing some of these common conditions. What should I know about menopause? During menopause, you may experience a number of symptoms, such as:  Moderate-to-severe hot flashes.  Night sweats.  Decrease in sex drive.  Mood swings.  Headaches.  Tiredness.  Irritability.  Memory problems.  Insomnia. Choosing to treat or not to treat menopausal changes is an individual decision that you make with your health care provider. What should I know about hormone replacement therapy and supplements? Hormone therapy products are effective for treating symptoms that are associated with menopause, such as hot flashes and night sweats. Hormone replacement carries certain risks, especially as you become older. If you  are thinking about using estrogen or estrogen with progestin treatments, discuss the benefits and risks with your health care provider. What should I know about heart disease and stroke? Heart disease, heart attack, and stroke become more likely as you age. This may be due, in part, to the hormonal changes that your body experiences during menopause. These can affect how your body processes dietary fats, triglycerides, and cholesterol. Heart attack and stroke are both medical emergencies. There are many things that you can do to help prevent heart disease and stroke:  Have your blood pressure checked at least every 1-2 years. High blood pressure causes heart disease and increases the risk of stroke.  If you are 74-54 years old, ask your health care provider if you should take aspirin to prevent a heart attack or a stroke.  Do not use any tobacco products, including cigarettes, chewing tobacco, or electronic cigarettes. If you need help  quitting, ask your health care provider.  It is important to eat a healthy diet and maintain a healthy weight.  Be sure to include plenty of vegetables, fruits, low-fat dairy products, and lean protein.  Avoid eating foods that are high in solid fats, added sugars, or salt (sodium).  Get regular exercise. This is one of the most important things that you can do for your health.  Try to exercise for at least 150 minutes each week. The type of exercise that you do should increase your heart rate and make you sweat. This is known as moderate-intensity exercise.  Try to do strengthening exercises at least twice each week. Do these in addition to the moderate-intensity exercise.  Know your numbers.Ask your health care provider to check your cholesterol and your blood glucose. Continue to have your blood tested as directed by your health care provider. What should I know about cancer screening? There are several types of cancer. Take the following steps to reduce your risk and to catch any cancer development as early as possible. Breast Cancer  Practice breast self-awareness.  This means understanding how your breasts normally appear and feel.  It also means doing regular breast self-exams. Let your health care provider know about any changes, no matter how small.  If you are 59 or older, have a clinician do a breast exam (clinical breast exam or CBE) every year. Depending on your age, family history, and medical history, it may be recommended that you also have a yearly breast X-ray (mammogram).  If you have a family history of breast cancer, talk with your health care provider about genetic screening.  If you are at high risk for breast cancer, talk with your health care provider about having an MRI and a mammogram every year.  Breast cancer (BRCA) gene test is recommended for women who have family members with BRCA-related cancers. Results of the assessment will determine the need for  genetic counseling and BRCA1 and for BRCA2 testing. BRCA-related cancers include these types:  Breast. This occurs in males or females.  Ovarian.  Tubal. This may also be called fallopian tube cancer.  Cancer of the abdominal or pelvic lining (peritoneal cancer).  Prostate.  Pancreatic. Cervical, Uterine, and Ovarian Cancer  Your health care provider may recommend that you be screened regularly for cancer of the pelvic organs. These include your ovaries, uterus, and vagina. This screening involves a pelvic exam, which includes checking for microscopic changes to the surface of your cervix (Pap test).  For women ages 21-65, health care providers may recommend a  pelvic exam and a Pap test every three years. For women ages 49-65, they may recommend the Pap test and pelvic exam, combined with testing for human papilloma virus (HPV), every five years. Some types of HPV increase your risk of cervical cancer. Testing for HPV may also be done on women of any age who have unclear Pap test results.  Other health care providers may not recommend any screening for nonpregnant women who are considered low risk for pelvic cancer and have no symptoms. Ask your health care provider if a screening pelvic exam is right for you.  If you have had past treatment for cervical cancer or a condition that could lead to cancer, you need Pap tests and screening for cancer for at least 20 years after your treatment. If Pap tests have been discontinued for you, your risk factors (such as having a new sexual partner) need to be reassessed to determine if you should start having screenings again. Some women have medical problems that increase the chance of getting cervical cancer. In these cases, your health care provider may recommend that you have screening and Pap tests more often.  If you have a family history of uterine cancer or ovarian cancer, talk with your health care provider about genetic screening.  If you have  vaginal bleeding after reaching menopause, tell your health care provider.  There are currently no reliable tests available to screen for ovarian cancer. Lung Cancer  Lung cancer screening is recommended for adults 104-41 years old who are at high risk for lung cancer because of a history of smoking. A yearly low-dose CT scan of the lungs is recommended if you:  Currently smoke.  Have a history of at least 30 pack-years of smoking and you currently smoke or have quit within the past 15 years. A pack-year is smoking an average of one pack of cigarettes per day for one year. Yearly screening should:  Continue until it has been 15 years since you quit.  Stop if you develop a health problem that would prevent you from having lung cancer treatment. Colorectal Cancer  This type of cancer can be detected and can often be prevented.  Routine colorectal cancer screening usually begins at age 18 and continues through age 84.  If you have risk factors for colon cancer, your health care provider may recommend that you be screened at an earlier age.  If you have a family history of colorectal cancer, talk with your health care provider about genetic screening.  Your health care provider may also recommend using home test kits to check for hidden blood in your stool.  A small camera at the end of a tube can be used to examine your colon directly (sigmoidoscopy or colonoscopy). This is done to check for the earliest forms of colorectal cancer.  Direct examination of the colon should be repeated every 5-10 years until age 22. However, if early forms of precancerous polyps or small growths are found or if you have a family history or genetic risk for colorectal cancer, you may need to be screened more often. Skin Cancer  Check your skin from head to toe regularly.  Monitor any moles. Be sure to tell your health care provider:  About any new moles or changes in moles, especially if there is a change  in a mole's shape or color.  If you have a mole that is larger than the size of a pencil eraser.  If any of your family members has  a history of skin cancer, especially at a young age, talk with your health care provider about genetic screening.  Always use sunscreen. Apply sunscreen liberally and repeatedly throughout the day.  Whenever you are outside, protect yourself by wearing long sleeves, pants, a wide-brimmed hat, and sunglasses. What should I know about osteoporosis? Osteoporosis is a condition in which bone destruction happens more quickly than new bone creation. After menopause, you may be at an increased risk for osteoporosis. To help prevent osteoporosis or the bone fractures that can happen because of osteoporosis, the following is recommended:  If you are 72-56 years old, get at least 1,000 mg of calcium and at least 600 mg of vitamin D per day.  If you are older than age 35 but younger than age 42, get at least 1,200 mg of calcium and at least 600 mg of vitamin D per day.  If you are older than age 21, get at least 1,200 mg of calcium and at least 800 mg of vitamin D per day. Smoking and excessive alcohol intake increase the risk of osteoporosis. Eat foods that are rich in calcium and vitamin D, and do weight-bearing exercises several times each week as directed by your health care provider. What should I know about how menopause affects my mental health? Depression may occur at any age, but it is more common as you become older. Common symptoms of depression include:  Low or sad mood.  Changes in sleep patterns.  Changes in appetite or eating patterns.  Feeling an overall lack of motivation or enjoyment of activities that you previously enjoyed.  Frequent crying spells. Talk with your health care provider if you think that you are experiencing depression. What should I know about immunizations? It is important that you get and maintain your immunizations. These  include:  Tetanus, diphtheria, and pertussis (Tdap) booster vaccine.  Influenza every year before the flu season begins.  Pneumonia vaccine.  Shingles vaccine. Your health care provider may also recommend other immunizations. This information is not intended to replace advice given to you by your health care provider. Make sure you discuss any questions you have with your health care provider. Document Released: 05/03/2005 Document Revised: 09/29/2015 Document Reviewed: 12/13/2014  2017 Elsevier

## 2016-02-20 NOTE — Progress Notes (Signed)
[name], is a 55 y.o. female  [id number]  [id number]  DOB - April 24, 1960  Chief Complaint  Patient presents with  . Follow-up        Subjective:   [name] is a 55 y.o. female here today for a follow up visit, last seen 11/07/15, for right temp ICH, repeat MRI stable, and htn/predm.  No c/o otherwise, still doing physical therapy.  Has not heard about her Medicaid status, almost 90 days now she states.  Co of right shoulder pain, 4/10 pain now, better w/ voltarin gel.   She is taking all her meds. States has chronic diarrhea, even before metformin. Able to take all meds as prescribed.  Still unable to get sleep study due to no insurance at this time.   Patient has No headache, No chest pain, No abdominal pain - No Nausea, No new weakness tingling or numbness, No Cough - SOB.  No problems updated.  ALLERGIES: Allergies  Allergen Reactions  . Eggs Or Egg-Derived Products   . Gadolinium Derivatives Itching    Immediately after gad injection pt. complained of tongue numbness and lower lip/ then had rt side of body tingling/ pt. Was assessed by Dr. Jeralyn Ruths before leaving/ no medications were administered/    PAST MEDICAL HISTORY: Past Medical History:  Diagnosis Date  . Headache   . Hypertension   . Iron deficiency anemia   . Seizures ([place])   . Stroke [facility])     MEDICATIONS AT HOME: Prior to Admission medications   Medication Sig Start Date End Date Taking? Authorizing Provider  amLODipine (NORVASC) 10 MG tablet Take 1 tablet (10 mg total) by mouth daily. 02/20/16   Maren Reamer, MD  chlorthalidone (HYGROTON) 25 MG tablet Take 1 tablet (25 mg total) by mouth daily. 02/20/16   Maren Reamer, MD  diclofenac sodium (VOLTAREN) 1 % GEL Apply 2 g topically 4 (four) times daily. 02/02/16   Ankit Lorie Phenix, MD  fluticasone (FLONASE) 50 MCG/ACT nasal spray Place 1 spray into both nostrils daily as needed for allergies or rhinitis.    Historical Provider,  MD  hydroxypropyl methylcellulose / hypromellose (ISOPTO TEARS / GONIOVISC) 2.5 % ophthalmic solution Place 2 drops into both eyes as needed (irritation). 10/20/15   Bary Leriche, PA-C  iron polysaccharides (NIFEREX) 150 MG capsule Take 1 capsule (150 mg total) by mouth 2 times daily at 12 noon and 4 pm. 10/20/15   Ivan Anchors Love, PA-C  KLOR-CON M20 20 MEQ tablet Take 20 mEq by mouth daily. 10/20/15   Historical Provider, MD  levETIRAcetam (KEPPRA) 750 MG tablet Take 1 tablet (750 mg total) by mouth 2 (two) times daily. 02/20/16   Maren Reamer, MD  metFORMIN (GLUCOPHAGE XR) 500 MG 24 hr tablet Take 1 tablet (500 mg total) by mouth daily with breakfast. 02/20/16   Maren Reamer, MD  metoprolol (LOPRESSOR) 50 MG tablet Take 1 tablet (50 mg total) by mouth 2 (two) times daily. 02/20/16   Maren Reamer, MD     Objective:   Vitals:   02/20/16 0924  BP: 129/84  Pulse: 74  Resp: 16  Temp: 98.1 F (36.7 C)  TempSrc: Oral  SpO2: 99%  Weight: 264 lb 12.8 oz (120.1 kg)    Exam General appearance : Awake, alert, not in any distress. Speech Clear. Not toxic looking, pleasant. Morbid obese. HEENT: Atraumatic and Normocephalic, pupils equally reactive to light. Neck: supple, no JVD.  Chest:Good air entry bilaterally, no  added sounds. CVS: S1 S2 regular, no murmurs/gallups or rubs. Abdomen: Bowel sounds active, obese, Non tender and not distended with no gaurding, rigidity or rebound. Extremities: B/L Lower Ext shows no edema, both legs are warm to touch Neurology: Awake alert, and oriented X 3, CN II-XII grossly intact, Non focal Skin:No Rash  Data Review Lab Results  Component Value Date   HGBA1C 5.8 02/20/2016   HGBA1C 5.7 (H) 10/07/2015    Depression screen PHQ 2/9 02/20/2016 11/07/2015 11/02/2015 10/23/2015  Decreased Interest 1 (No Data) 3 3  Down, Depressed, Hopeless 1 (No Data) 1 3  PHQ - 2 Score 2 - 4 6  Altered sleeping 2 2 3 3   Tired, decreased energy 1 3 3 3   Change in  appetite 2 2 1 3   Feeling bad or failure about yourself  1 0 0 3  Trouble concentrating 0 2 3 3   Moving slowly or fidgety/restless 0 0 3 3  Suicidal thoughts 0 0 0 3  PHQ-9 Score 8 - 17 27  Difficult doing work/chores - - Extremely dIfficult -      Assessment & Plan   1. Cerebrovascular accident (CVA), unspecified mechanism ([place]) - continue PT, encouraged to keep fu w/ Neuro - bp mostly at goal <130/80, recd low salt diet - CBC with Differential - BASIC METABOLIC PANEL WITH GFR - renewed Keppra 750bid for now until f/u w/ neuro  2. htn - recd low salt diet, increase exercise - renewed norvasc 10, lopressor 50bid, and chlorathalidone 25 wd - rechk bmp prior to renewing kdur - Prediabetes - low carb diet discussed for weight loss, - renewed metformin. - POCT glucose (manual entry) - POCT glycosylated hemoglobin (Hb A1C)  3. Need for hepatitis C screening test - Hepatitis C antibody  4. Encounter for screening for HIV - HIV antibody (with reflex)  5. Obstructive sleep apnea, suspected Needs insurance prior to sleep study  6. Super obese ([place]) Low carb diet discussed for weight loss, lc/hf/if diet sheet provided  7. Iron def anemia - will rechk cbc prior to refilling iron suppl.  8. tdap today    Patient have been counseled extensively about nutrition and exercise  Return in about 4 weeks (around 03/19/2016), or if symptoms worsen or fail to improve, for pap.  The patient was given clear instructions to go to ER or return to medical center if symptoms don't improve, worsen or new problems develop. The patient verbalized understanding. The patient was told to call to get lab results if they haven't heard anything in the next week.   This note has been created with Surveyor, quantity. Any transcriptional errors are unintentional.   Maren Reamer, MD, [place] [facility] [place], [place]   02/20/2016, 9:49 AM

## 2016-02-21 ENCOUNTER — Telehealth: Payer: Self-pay

## 2016-02-21 ENCOUNTER — Other Ambulatory Visit: Payer: Self-pay | Admitting: Internal Medicine

## 2016-02-21 DIAGNOSIS — D509 Iron deficiency anemia, unspecified: Secondary | ICD-10-CM

## 2016-02-21 LAB — CBC WITH DIFFERENTIAL/PLATELET
BASOS ABS: 0 {cells}/uL (ref 0–200)
BASOS PCT: 0 %
EOS ABS: 152 {cells}/uL (ref 15–500)
EOS PCT: 2 %
HCT: 37.8 % (ref 35.0–45.0)
HEMOGLOBIN: 11.6 g/dL — AB (ref 11.7–15.5)
LYMPHS ABS: 3268 {cells}/uL (ref 850–3900)
Lymphocytes Relative: 43 %
MCH: 23.6 pg — AB (ref 27.0–33.0)
MCHC: 30.7 g/dL — ABNORMAL LOW (ref 32.0–36.0)
MCV: 76.8 fL — ABNORMAL LOW (ref 80.0–100.0)
MPV: 8.8 fL (ref 7.5–12.5)
Monocytes Absolute: 532 cells/uL (ref 200–950)
Monocytes Relative: 7 %
NEUTROS ABS: 3648 {cells}/uL (ref 1500–7800)
Neutrophils Relative %: 48 %
Platelets: 370 10*3/uL (ref 140–400)
RBC: 4.92 MIL/uL (ref 3.80–5.10)
RDW: 16.7 % — ABNORMAL HIGH (ref 11.0–15.0)
WBC: 7.6 10*3/uL (ref 3.8–10.8)

## 2016-02-21 LAB — HEPATITIS C ANTIBODY: HCV Ab: NEGATIVE

## 2016-02-21 LAB — BASIC METABOLIC PANEL WITH GFR
BUN: 18 mg/dL (ref 7–25)
CHLORIDE: 103 mmol/L (ref 98–110)
CO2: 31 mmol/L (ref 20–31)
CREATININE: 0.86 mg/dL (ref 0.50–1.05)
Calcium: 9.2 mg/dL (ref 8.6–10.4)
GFR, Est African American: 88 mL/min (ref >=60)
GFR, Est Non African American: 76 mL/min (ref >=60)
Glucose, Bld: 113 mg/dL — ABNORMAL HIGH (ref 65–99)
POTASSIUM: 4.1 mmol/L (ref 3.5–5.3)
SODIUM: 141 mmol/L (ref 135–146)

## 2016-02-21 LAB — HIV ANTIBODY (ROUTINE TESTING W REFLEX): HIV: NONREACTIVE

## 2016-02-21 MED ORDER — POLYSACCHARIDE IRON COMPLEX 150 MG PO CAPS: 150.0000 mg | ORAL_CAPSULE | Freq: Two times a day (BID) | ORAL | 0 refills | Status: DC

## 2016-02-21 NOTE — Telephone Encounter (Signed)
Contacted pt to go over lab results pt is aware of results and doesn't have any questions or concerns 

## 2016-02-22 ENCOUNTER — Encounter: Payer: Self-pay | Admitting: *Deleted

## 2016-02-22 ENCOUNTER — Ambulatory Visit: Payer: Self-pay | Admitting: Physical Therapy

## 2016-02-22 ENCOUNTER — Ambulatory Visit: Payer: Medicaid Other | Attending: Internal Medicine

## 2016-02-22 ENCOUNTER — Encounter: Payer: Self-pay | Admitting: Occupational Therapy

## 2016-02-22 DIAGNOSIS — D509 Iron deficiency anemia, unspecified: Secondary | ICD-10-CM | POA: Diagnosis present

## 2016-02-22 NOTE — Progress Notes (Signed)
Patient here for lab visit only 

## 2016-02-22 NOTE — Addendum Note (Signed)
Addended by: Octaviano Glow on: 02/22/2016 09:28 AM   Modules accepted: Orders

## 2016-02-23 LAB — IRON,TIBC AND FERRITIN PANEL
%SAT: 9 % — AB (ref 11–50)
FERRITIN: 77 ng/mL (ref 10–232)
Iron: 26 ug/dL — ABNORMAL LOW (ref 45–160)
TIBC: 275 ug/dL (ref 250–450)

## 2016-02-26 ENCOUNTER — Other Ambulatory Visit: Payer: Self-pay | Admitting: Internal Medicine

## 2016-02-27 ENCOUNTER — Encounter: Payer: Self-pay | Admitting: *Deleted

## 2016-02-27 ENCOUNTER — Ambulatory Visit: Payer: Self-pay | Admitting: Physical Therapy

## 2016-02-27 ENCOUNTER — Encounter: Payer: Self-pay | Admitting: Occupational Therapy

## 2016-02-29 ENCOUNTER — Ambulatory Visit: Payer: Medicaid Other | Admitting: Physical Therapy

## 2016-02-29 ENCOUNTER — Encounter: Payer: Self-pay | Admitting: Physical Therapy

## 2016-02-29 ENCOUNTER — Ambulatory Visit: Payer: Medicaid Other | Admitting: Occupational Therapy

## 2016-02-29 ENCOUNTER — Ambulatory Visit: Payer: Medicaid Other | Attending: Neurology | Admitting: Speech Pathology

## 2016-02-29 DIAGNOSIS — M6281 Muscle weakness (generalized): Secondary | ICD-10-CM

## 2016-02-29 DIAGNOSIS — R278 Other lack of coordination: Secondary | ICD-10-CM | POA: Insufficient documentation

## 2016-02-29 DIAGNOSIS — I69253 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 non-dominant side: Secondary | ICD-10-CM | POA: Diagnosis present

## 2016-02-29 DIAGNOSIS — R471 Dysarthria and anarthria: Secondary | ICD-10-CM

## 2016-02-29 DIAGNOSIS — R2681 Unsteadiness on feet: Secondary | ICD-10-CM | POA: Insufficient documentation

## 2016-02-29 DIAGNOSIS — R41842 Visuospatial deficit: Secondary | ICD-10-CM | POA: Insufficient documentation

## 2016-02-29 DIAGNOSIS — M25511 Pain in right shoulder: Secondary | ICD-10-CM | POA: Diagnosis present

## 2016-02-29 DIAGNOSIS — I69115 Cognitive social or emotional deficit following nontraumatic intracerebral hemorrhage: Secondary | ICD-10-CM | POA: Insufficient documentation

## 2016-02-29 DIAGNOSIS — R2689 Other abnormalities of gait and mobility: Secondary | ICD-10-CM | POA: Diagnosis present

## 2016-02-29 DIAGNOSIS — R4701 Aphasia: Secondary | ICD-10-CM

## 2016-02-29 NOTE — Therapy (Signed)
[facility] [address] [place], [place], [address] Phone: [phone]   Fax:  [phone]  Speech Language Pathology Treatment  Patient Details  Name: [name] MRN: [id number] Date of Birth: 25-Mar-1961 Referring Provider: Debbe Odea MD  Encounter Date: 02/29/2016      End of Session - 02/29/16 1401    Visit Number 7   Number of Visits 17   Date for SLP Re-Evaluation 04/05/16   SLP Start Time [id number]   SLP Stop Time  1401   SLP Time Calculation (min) 44 min   Activity Tolerance Patient tolerated treatment well      Past Medical History:  Diagnosis Date  . Headache   . Hypertension   . Iron deficiency anemia   . Seizures ([place])   . Stroke [facility])     Past Surgical History:  Procedure Laterality Date  . CARDIAC CATHETERIZATION  04/2010   Dr. Terrence Dupont    There were no vitals filed for this visit.      Subjective Assessment - 02/29/16 1322    Subjective "I did my homework in therapy last time"               ADULT SLP TREATMENT - 02/29/16 1323      General Information   Behavior/Cognition Alert;Cooperative;Pleasant mood     Treatment Provided   Treatment provided Cognitive-Linquistic     Pain Assessment   Pain Assessment 0-10   Pain Score 6    Pain Location right shoulder   Pain Descriptors / Indicators Tender   Pain Intervention(s) Monitored during session     Cognitive-Linquistic Treatment   Treatment focused on Aphasia;Dysarthria   Skilled Treatment Facilitated loud volume with breath support and verbal cues to feel like she is talking too loud  in structured description task - average 69dB - Trained pt in compensations for aphasia descriibing items for ST to guess. Mildly complex naming tasks with rare min A - pt utilize compensations during naming tasks with min questioning cues.      Progression Toward Goals   Progression toward goals Progressing toward goals             SLP Short Term Goals - 02/29/16 1359      SLP SHORT TERM GOAL #1   Title pt will incr speech volume to average 69dB for 18/20 sentence responses over three sessions   Baseline adequate vocal intensity for quiet environment   Time 1   Status Achieved     SLP SHORT TERM GOAL #2   Title pt will name 8-10 items in mod complex category with modified independence over three sessions   Baseline 11/6, 12/7,   Time 1   Period Weeks   Status On-going     SLP SHORT TERM GOAL #3   Title pt will demo [facility] 5 minutes mod-complex conversation with rare min A over two sessions   Baseline 15 minute conversation with no cues 01/30/16, 12/7   Period Weeks   Status Achieved     SLP SHORT TERM GOAL #4   Title Pt to complete moderately complex reasoning tasks at mod I level and >80% acc over 2 sessions.   Time 1   Period Weeks   Status On-going          SLP Long Term Goals - 02/29/16 1401      SLP LONG TERM GOAL #1   Title pt will demo WFL 10 minutes mod complex conversation with modified  independence (using compensations) in three sessions   Baseline 15 minute conversation wtih independent use of strategies to compensate for word finding errors   Time 4   Period Weeks   Status On-going     SLP LONG TERM GOAL #2   Title pt will name at least average of 6 items in complex/abstract category over three sessions   Time 4   Period Weeks   Status On-going     SLP LONG TERM GOAL #3   Title pt will incr speech volume in 10 minutes mod complex conversation to average 70dB over three sessions   Baseline 15 minute conversation with adequate vocal intensity in quiet environment.   Time 4   Period Weeks   Status On-going          Plan - 02/29/16 1358    Clinical Impression Statement Pt demonstrated loudness average of 69 dB with min to mod verbal cues. Compensations for aphasia with questioning cues. Continue skilled ST to maximize intellgilbity and verbal expression .   Speech Therapy  Frequency 2x / week   Treatment/Interventions Compensatory strategies;Functional tasks;Cueing hierarchy;Internal/external aids;Multimodal communcation approach;SLP instruction and feedback;Language facilitation;Patient/family education;Cognitive reorganization   Potential to Achieve Goals Good   Consulted and Agree with Plan of Care Patient;Family member/caregiver      Patient will benefit from skilled therapeutic intervention in order to improve the following deficits and impairments:   Aphasia  Dysarthria and anarthria    Problem List Patient Active Problem List   Diagnosis Date Noted  . Obstructive sleep apnea 11/29/2015  . Right shoulder pain 11/29/2015  . Adjustment disorder with depressed mood 10/19/2015  . Nocturnal enuresis   . Hypokalemia   . Anemia, iron deficiency   . Super obese ([place])   . Prediabetes   . Orthostatic hypotension   . Supplemental oxygen dependent   . Diastolic dysfunction   . Tachypnea   . AKI (acute kidney injury) ([place])   . Acute blood loss anemia   . Lethargy   . Wheezing   . Benign essential HTN   . ICH (intracerebral hemorrhage) ([place]) 10/06/2015    [name] 02/29/2016, 2:02 PM  [facility] [address] [place], [place], [address] Phone: [phone]   Fax:  [phone]   Name: [name] MRN: [id number] Date of Birth: 01/29/1961

## 2016-02-29 NOTE — Therapy (Signed)
[facility] [address] [employer] [place], [place], [address] Phone: [phone]   Fax:  [phone]  Occupational Therapy Treatment  Patient Details  Name: [name] MRN: [id number] Date of Birth: [birth year] Referring Provider: Dr. Erlinda Hong  Encounter Date: 02/29/2016      OT End of Session - 02/29/16 1557    Visit Number 6   Number of Visits 17   Date for OT Re-Evaluation 03/17/16   Authorization Type self pay, [facility] pending   Authorization Time Period pt is checking on when she will be approved, she may reduce frequency while she is self pay   OT Start Time 1410   OT Stop Time 1448   OT Time Calculation (min) 38 min   Activity Tolerance Patient tolerated treatment well      Past Medical History:  Diagnosis Date  . Headache   . Hypertension   . Iron deficiency anemia   . Seizures ([place])   . Stroke [facility])     Past Surgical History:  Procedure Laterality Date  . CARDIAC CATHETERIZATION  04/2010   Dr. Terrence Dupont    There were no vitals filed for this visit.      Subjective Assessment - 02/29/16 1415    Subjective  I spoke with the lady at [facility] and she told me all expenses would be taken care of   Pertinent History see epic - pt with significant shoulder pain   Patient Stated Goals reduce pain and be able to move right arm   Currently in Pain? Yes   Pain Score 5    Pain Location Shoulder   Pain Orientation Right   Pain Descriptors / Indicators Tightness   Pain Type Acute pain   Pain Radiating Towards down to forearm   Pain Onset More than a month ago   Pain Frequency Intermittent   Aggravating Factors  malpositioning   Pain Relieving Factors repositioning                      OT Treatments/Exercises (OP) - 02/29/16 1604      ADLs   ADL Comments Discussion re: insurance as pt is currently self pay and re-inforced that pt would be responsible for bill since pt has not yet been approved for [facility]. Pt  reports that she went and spoke with [employer] rep and she informed pt that all expenses would be covered. Therapist then explained that [employer] does not retro pay for any past and current therapy, only once pt has [facility] and then therapy has been submitted to [facility] with approval is it covered. Pt advised to f/u with [employer] rep and to get financial aid packet      Neurological Re-education Exercises   Other Exercises 1 AA/ROM bilateral UE's alternating (reciprocal) in high level closed chain flexion, followed by simultaneously. Pt initially had pain RUE with this, but after proper positioning and cueing, pt able to perform with only "tightness".  Pt then progressed to RUE only for high level closed chain AA/ROM for wall slides.      Functional Reaching Activities   High Level Pt placing cones on mid to high level shelf RUE and then removing with min cues to keep correct position                OT Education - 02/29/16 1612    Education provided Yes   Education Details proper positioning of RUE for functional reaching and bed positioning, review of no  coverage of insurance   Person(s) Educated Patient   Methods Explanation   Comprehension Verbalized understanding          OT Short Term Goals - 02/29/16 1613      OT SHORT TERM GOAL #1   Title I with inital HEP. due 02/17/16   Baseline dependent   Time 4   Period Weeks   Status On-going     OT SHORT TERM GOAL #2   Title Pt will verbalize understanding of RUE positioning to minimize pain and risk for injury.   Baseline dependent   Time 4   Period Weeks   Status Achieved     OT SHORT TERM GOAL #3   Title Pt will verbalize understanding of compensatory strategies for short term memory deficits.   Baseline dependent   Time 4   Period Weeks   Status Achieved     OT SHORT TERM GOAL #4   Title Pt will demonstrate 90 shoulder flexion with pain less than or equal to 4/10 for functional reach.   Baseline 70*   Time 4   Period Weeks   Status  On-going     OT SHORT TERM GOAL #5   Title Pt will perform dressing modified independently.   Baseline min A   Time 4   Period Weeks   Status Achieved           OT Long Term Goals - 02/19/16 1252      OT LONG TERM GOAL #1   Title Pt will perform mod complex cooking/ home management at a modified indpendent level demonstrating good safety awareness. due 03/17/16   Baseline performing light home management and basic cooking with assistance   Time 8   Period Weeks   Status On-going     OT LONG TERM GOAL #2   Title Pt will demonstrate ability to perform tabletop and environmental scanning tasks with 95% better accuracy and no significant diplopia.   Baseline Pt reports difficulty reading due to significant diplopia, pt does not perform effective environmental scanning due to c/o of diplopia   Time 8   Period Weeks   Status On-going     OT LONG TERM GOAL #3   Title Pt will resume use of RUE as a non dominant assist with ADLs/ IADLs at least 50% of the time with pain no greater than 3/10   Baseline unable to use RUE consistently  due to severe pain   Time 8   Period Weeks   Status On-going     OT LONG TERM GOAL #4   Title Pt will demonstrate improved fine motor coordiantion as evidenced by decreasing RUE 9 hole peg test score by 4 secs from eval.   Baseline to be assessed   Time 8   Period Weeks   Status On-going     OT LONG TERM GOAL #5   Title Pt will demonstrate ability to perform  a physical and cognitive task simultaneously with 90% or better accuracy.   Baseline to be further assessed, impaired attention   Time 8   Period Weeks   Status On-going               Plan - 02/29/16 1614    Clinical Impression Statement Pt able to demo decreased pain after proper positioning and repetition with RUE closed chain and open chain activities. However, pt continues to need re-inforcement of recommendations   Rehab Potential Fair   Clinical Impairments Affecting Rehab  Potential ?  education level, reduced frequency d/t no insurance coverage   OT Frequency 1x / week   OT Duration 8 weeks   OT Treatment/Interventions Self-care/ADL training;Fluidtherapy;Moist Heat;DME and/or AE instruction;Splinting;Patient/family education;Balance training;Therapeutic exercises;Therapeutic Higher education careers adviser;Therapeutic exercise;Ultrasound;Cryotherapy;Iontophoresis;Neuromuscular education;Functional Mobility Training;Passive range of motion;Cognitive remediation/compensation;Visual/perceptual remediation/compensation;Manual Therapy;Electrical Stimulation;Parrafin   Plan continue RUE neuro re-education and functional use. 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 Obstructive sleep apnea 11/29/2015  . Right shoulder pain 11/29/2015  . Adjustment disorder with depressed mood 10/19/2015  . Nocturnal enuresis   . Hypokalemia   . Anemia, iron deficiency   . Super obese ([place])   . Prediabetes   . Orthostatic hypotension   . Supplemental oxygen dependent   . Diastolic dysfunction   . Tachypnea   . AKI (acute kidney injury) ([place])   . Acute blood loss anemia   . Lethargy   . Wheezing   .  Benign essential HTN   . ICH (intracerebral hemorrhage) ([place]) 10/06/2015    Carey Bullocks, OTR/L 02/29/2016, 4:21 PM  [facility] [address] [place], [place], [address] Phone: [phone]   Fax:  [phone]  Name: [name] MRN: [id number] Date of Birth: [birth year]

## 2016-03-01 ENCOUNTER — Encounter: Payer: Self-pay | Admitting: Registered Nurse

## 2016-03-01 ENCOUNTER — Encounter: Payer: Medicaid Other | Attending: Physical Medicine & Rehabilitation | Admitting: Registered Nurse

## 2016-03-01 ENCOUNTER — Telehealth: Payer: Self-pay

## 2016-03-01 VITALS — BP 125/86 | HR 71

## 2016-03-01 DIAGNOSIS — R7303 Prediabetes: Secondary | ICD-10-CM | POA: Diagnosis not present

## 2016-03-01 DIAGNOSIS — Z5189 Encounter for other specified aftercare: Secondary | ICD-10-CM | POA: Diagnosis present

## 2016-03-01 DIAGNOSIS — M7541 Impingement syndrome of right shoulder: Secondary | ICD-10-CM

## 2016-03-01 DIAGNOSIS — R269 Unspecified abnormalities of gait and mobility: Secondary | ICD-10-CM | POA: Diagnosis not present

## 2016-03-01 DIAGNOSIS — I10 Essential (primary) hypertension: Secondary | ICD-10-CM | POA: Insufficient documentation

## 2016-03-01 DIAGNOSIS — F329 Major depressive disorder, single episode, unspecified: Secondary | ICD-10-CM

## 2016-03-01 DIAGNOSIS — I69249 Monoplegia of lower limb following other nontraumatic intracranial hemorrhage affecting unspecified side: Secondary | ICD-10-CM | POA: Insufficient documentation

## 2016-03-01 DIAGNOSIS — R569 Unspecified convulsions: Secondary | ICD-10-CM | POA: Insufficient documentation

## 2016-03-01 DIAGNOSIS — I611 Nontraumatic intracerebral hemorrhage in hemisphere, cortical: Secondary | ICD-10-CM

## 2016-03-01 DIAGNOSIS — I629 Nontraumatic intracranial hemorrhage, unspecified: Secondary | ICD-10-CM | POA: Insufficient documentation

## 2016-03-01 DIAGNOSIS — M62838 Other muscle spasm: Secondary | ICD-10-CM | POA: Diagnosis not present

## 2016-03-01 NOTE — Progress Notes (Signed)
Subjective:    Patient ID: [name], female    DOB: [birth year], 55 y.o.   MRN: [id number]  HPI: [name] is a 55 year old female who returns for follow up appointment. She states her pain is located in her right arm. She rates her pain 7. Her current exercise regime is attending physical and occupational therapy one day a week.   Pain Inventory Average Pain 7 Pain Right Now 7 My pain is intermittent, sharp, dull, stabbing and aching  In the last 24 hours, has pain interfered with the following? General activity 5 Relation with others 5 Enjoyment of life 7 What TIME of day is your pain at its worst? morning Sleep (in general) Poor  Pain is worse with: bending Pain improves with: medication Relief from Meds: 5  Mobility use a cane ability to climb steps?  yes do you drive?  no  Function disabled: date disabled . I need assistance with the following:  meal prep, household duties and shopping  Neuro/Psych weakness trouble walking anxiety  Prior Studies Any changes since last visit?  no  Physicians involved in your care Any changes since last visit?  no   Family History  Problem Relation Age of Onset  . Diabetes Father   . Hypertension Father   . Hypertension Sister   . Stroke Brother    Social History   Social History  . Marital status: Married    Spouse name: N/A  . Number of children: N/A  . Years of education: N/A   Social History Main Topics  . Smoking status: Never Smoker  . Smokeless tobacco: Never Used  . Alcohol use No  . Drug use: No  . Sexual activity: Not on file   Other Topics Concern  . Not on file   Social History Narrative  . No narrative on file   Past Surgical History:  Procedure Laterality Date  . CARDIAC CATHETERIZATION  04/2010   Dr. Terrence Dupont   Past Medical History:  Diagnosis Date  . Headache   . Hypertension   . Iron deficiency anemia   . Seizures ([place])   . Stroke ([place])    LMP 02/17/2011   Opioid  Risk Score:   Fall Risk Score:  `1  Depression screen PHQ 2/9  Depression screen [facility] 2/9 02/20/2016 11/07/2015 11/02/2015 10/23/2015  Decreased Interest 1 (No Data) 3 3  Down, Depressed, Hopeless 1 (No Data) 1 3  PHQ - 2 Score 2 - 4 6  Altered sleeping 2 2 3 3   Tired, decreased energy 1 3 3 3   Change in appetite 2 2 1 3   Feeling bad or failure about yourself  1 0 0 3  Trouble concentrating 0 2 3 3   Moving slowly or fidgety/restless 0 0 3 3  Suicidal thoughts 0 0 0 3  PHQ-9 Score 8 - 17 27  Difficult doing work/chores - - Extremely dIfficult -   Review of Systems  HENT: Negative.   Eyes: Negative.   Respiratory: Negative.   Cardiovascular: Negative.   Gastrointestinal: Negative.   Endocrine: Negative.   Genitourinary: Negative.   Musculoskeletal: Positive for gait problem.  Skin: Negative.   Allergic/Immunologic: Negative.   Neurological: Positive for weakness.  Hematological: Negative.   Psychiatric/Behavioral: The patient is nervous/anxious.   All other systems reviewed and are negative.      Objective:   Physical Exam  Constitutional: She is oriented to person, place, and time. She appears well-developed and well-nourished.  HENT:  Head: Normocephalic and atraumatic.  Neck: Normal range of motion. Neck supple.  Cardiovascular: Normal rate and regular rhythm.   Pulmonary/Chest: Effort normal and breath sounds normal.  Musculoskeletal:  Normal Muscle Bulk and Muscle Testing Reveals: Upper Extremities: Right: Decreased ROM 45 Degrees and Muscle Strength 4/5 Left: Full ROM and Muscle Strength 5/5 Right AC Joint Tenderness Lower Extremities: Full ROM and Muscle Strength 5/5 Arises from Table slowly using 4 prong cane for support Narrow Based gait  Neurological: She is alert and oriented to person, place, and time.  Skin: Skin is warm and dry.  Psychiatric: She has a normal mood and affect.  Nursing note and vitals reviewed.         Assessment & Plan:  1.S/p  right temporal intracranial hemorrhage: Continue Physical and Occupational Therapy. F/U with Neurology 2. Seizures: Continue Keppra: Neurology Following 3.  Morbid obesity: Continue Healthy Diet regime 4. Abnormality of gait : Continue Physical Therapy 5. Right shoulder pain: Continue  Voltaren gel: X: Ray : Mild Degenerative Changes  20 minutes of face to face patient care time was spent during this visit. All questions were encouraged and answered.   F/U in 2 months with [name].

## 2016-03-01 NOTE — Telephone Encounter (Signed)
Contacted pt to go over lab results pt is aware of results. Pt states she has not received her iron pills that the pharmacy told her that they didn't receive a rx for them

## 2016-03-02 NOTE — Therapy (Signed)
[facility] [address] [place], [place], [address] Phone: [phone]   Fax:  [phone]  Physical Therapy Treatment  Patient Details  Name: [name] MRN: [id number] Date of Birth: 1960/04/21 Referring Provider: Rosalin Hawking, MD  Encounter Date: 02/29/2016   02/29/16 1456  PT Visits / Re-Eval  Visit Number 9  Number of Visits 17  Date for PT Re-Evaluation 03/18/16  Authorization  Authorization Type Possible MCD-waiting to get number from pt (understands that she is self pay until then)  PT Time Calculation  PT Start Time 1450  PT Stop Time 1530  PT Time Calculation (min) 40 min  PT - End of Session  Equipment Utilized During Treatment Gait belt  Activity Tolerance Patient tolerated treatment well  Behavior During Therapy [facility] for tasks assessed/performed     Past Medical History:  Diagnosis Date  . Headache   . Hypertension   . Iron deficiency anemia   . Seizures ([place])   . Stroke [facility])     Past Surgical History:  Procedure Laterality Date  . CARDIAC CATHETERIZATION  04/2010   Dr. Terrence Dupont    There were no vitals filed for this visit.     02/29/16 1455  Symptoms/Limitations  Subjective No new complaints. No falls to report, however was caught at church by pastor and deacon when she lost her balance backwards.   Patient is accompained by: Family member  Limitations House hold activities;Walking  Pain Assessment  Currently in Pain? Yes  Pain Score 7  Pain Location Shoulder  Pain Orientation Right  Pain Descriptors / Indicators Tightness  Pain Type Chronic pain  Pain Onset More than a month ago  Pain Frequency Intermittent  Aggravating Factors  malpositioning  Pain Relieving Factors repositioning      02/29/16 1458  Transfers  Transfers Sit to Stand;Stand to Sit  Sit to Stand 6: Modified independent (Device/Increase time);With upper extremity assist;From bed;From chair/3-in-1  Stand to  Sit 6: Modified independent (Device/Increase time);With upper extremity assist;To bed;To chair/3-in-1  Ambulation/Gait  Ambulation/Gait Yes  Ambulation/Gait Assistance 5: Supervision;4: Min guard;4: Min assist  Ambulation/Gait Assistance Details cues on posture and step length both with and without AD. no balance issues noted without use of AD   Ambulation Distance (Feet) 115 Feet (x 3 laps (1 with cane, 2 no AD))  Assistive device Straight cane;None  Gait Pattern Step-through pattern;Decreased stride length;Narrow base of support;Decreased arm swing - right;Decreased arm swing - left  Ambulation Surface Level;Indoor     02/29/16 1511  Balance Exercises: Standing  Standing Eyes Opened Wide (BOA);Head turns;Other reps (comment);Limitations  Standing Eyes Closed Wide (BOA);Foam/compliant surface;2 reps;Limitations  SLS with Vectors Solid surface;Other reps (comment);Limitations  Balance Exercises: Standing  Standing Eyes Opened Limitations on gray foam: EC head movements up<>down, left<>right and diagonals both ways x 10 reps with wide BOS.   Standing Eyes Closed Limitations on gray foam: EC no head movements for 15 sec's x 2 reps with min assist for balance  SLS with Vectors Limitations using foam bubbles: alternating fwd toe taps and alternating cross toe taps x 10 reps each bil LE"s with cues on BOS and weight shifting with min guard to min assist for balance.           PT Short Term Goals - 02/19/16 1237      PT SHORT TERM GOAL #1   Title Pt will initiate HEP in order to indicate improved functional mobility.  (Target Date: 02/15/16)  Baseline 02/19/2016 Met   Time --   Period --   Status Achieved     PT SHORT TERM GOAL #2   Title Will assess BERG balance test and improve score by 4 points in order to indicate decreased fall risk.     Baseline 02/19/16 Scored 36 today   Time --   Period --   Status Achieved     PT SHORT TERM GOAL #3   Title Pt will improve gait speed to  1.58 ft/sec in order to indicate decreased fall risk and improved efficiency of gait.     Baseline 11/27- improved to 1.22 ft/sec, not to goal   Time --   Period --   Status Partially Met     PT SHORT TERM GOAL #4   Title Will assess 6MWT and improve distance by 150' in order to indicate improved functional endurance.     Baseline 02/12/16 Pt ambulated 500 ft during 6MWT   Time --   Period --   Status Achieved     PT SHORT TERM GOAL #5   Title Pt will ambulate 300' w/ LRAD at mod I level over indoor surfaces in order to indicate safe home negotiation.     Baseline 11/27: 335 w/ SPC at min guard   Time 4   Period --   Status Partially Met           PT Long Term Goals - 01/18/16 1633      PT LONG TERM GOAL #1   Title Pt will be independent with HEP in order to indicate improved functional mobility.  (Target Date: 03/14/16)   Baseline dependent   Time 8   Period Weeks   Status New     PT LONG TERM GOAL #2   Title Pt will improve BERG balance test score 8 points from baseline in order to indicate decreased fall risk.     Baseline unable to assess due to time constraint   Time 8   Period Weeks   Status New     PT LONG TERM GOAL #3   Title Pt will increase gait speed to 2.18 ft/sec w/ LRAD in order to indicate decreased fall risk and improved efficiency of gait.     Baseline .98 ft/sec   Time 8   Period Weeks   Status New     PT LONG TERM GOAL #4   Title Pt will verbalize return to community fitness program in order to indicate ongoing work on functional endurance.     Baseline currently not involved in program.    Time 8   Period Weeks   Status New     PT LONG TERM GOAL #5   Title Pt will ambulate 500' w/ LRAD at mod I level over unlevel paved surfaces (including ramp/curb) in order to indicate safe return to community.     Baseline min/guard 100' w/ SPC over indoor surfaces   Time 8   Period Weeks   Status New         02/29/16 1457  Plan  Clinical  Impression Statement today's skilled session continued to focus on high level balance activities and gait with LRAD. No issues reported. Pt is making steady progress toward goals and should benefit from continued PT to progress toward goals not met.   Pt will benefit from skilled therapeutic intervention in order to improve on the following deficits Abnormal gait;Decreased activity tolerance;Decreased balance;Decreased mobility;Decreased strength;Dizziness;Impaired perceived functional ability;Impaired flexibility;Improper body mechanics;Postural dysfunction;Impaired  UE functional use  Rehab Potential Good  Clinical Impairments Affecting Rehab Potential possible visit limitations  PT Frequency 2x / week  PT Duration 8 weeks  PT Treatment/Interventions ADLs/Self Care Home Management;Electrical Stimulation;DME Instruction;Gait training;Stair training;Functional mobility training;Therapeutic activities;Neuromuscular re-education;Balance training;Therapeutic exercise;Patient/family education;Orthotic Fit/Training;Vestibular  PT Next Visit Plan continue gait/RLE strengthening, high level balance activities, proper technique for picking up objects off floor.  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 Obstructive sleep apnea 11/29/2015  . Right shoulder pain 11/29/2015  . Adjustment disorder with depressed mood 10/19/2015  . Nocturnal enuresis   . Hypokalemia   . Anemia, iron deficiency   . Super obese ([place])   . Prediabetes   . Orthostatic hypotension   . Supplemental oxygen dependent   . Diastolic dysfunction   . Tachypnea   . AKI (acute kidney injury) ([place])   . Acute blood loss anemia   . Lethargy   . Wheezing   . Benign essential HTN   . ICH (intracerebral hemorrhage) ([place]) 10/06/2015   Willow Ora, PTA, [facility] [address] [place], [place] [address] [phone] 03/02/16, 9:43 PM   Name: [name] MRN: [id number] Date of Birth: 20-May-1960

## 2016-03-04 MED ORDER — FERROUS SULFATE 325 (65 FE) MG PO TABS: 325.0000 mg | ORAL_TABLET | Freq: Two times a day (BID) | ORAL | 3 refills | Status: DC

## 2016-03-04 NOTE — Addendum Note (Signed)
Addended byLottie Mussel T on: 03/04/2016 10:13 AM   Modules accepted: Orders

## 2016-03-04 NOTE — Telephone Encounter (Signed)
I resent iron rx. thanks

## 2016-03-05 ENCOUNTER — Encounter: Payer: Self-pay | Admitting: Occupational Therapy

## 2016-03-05 ENCOUNTER — Encounter: Payer: Self-pay | Admitting: Speech Pathology

## 2016-03-05 ENCOUNTER — Ambulatory Visit: Payer: Self-pay | Admitting: Physical Therapy

## 2016-03-07 ENCOUNTER — Ambulatory Visit: Payer: Medicaid Other

## 2016-03-07 ENCOUNTER — Ambulatory Visit: Payer: Medicaid Other | Admitting: Occupational Therapy

## 2016-03-07 ENCOUNTER — Ambulatory Visit: Payer: Medicaid Other | Admitting: Physical Therapy

## 2016-03-07 ENCOUNTER — Encounter: Payer: Self-pay | Admitting: Physical Therapy

## 2016-03-07 DIAGNOSIS — R471 Dysarthria and anarthria: Secondary | ICD-10-CM

## 2016-03-07 DIAGNOSIS — M6281 Muscle weakness (generalized): Secondary | ICD-10-CM

## 2016-03-07 DIAGNOSIS — I69253 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 non-dominant side: Secondary | ICD-10-CM

## 2016-03-07 DIAGNOSIS — M25511 Pain in right shoulder: Secondary | ICD-10-CM

## 2016-03-07 DIAGNOSIS — R2681 Unsteadiness on feet: Secondary | ICD-10-CM

## 2016-03-07 DIAGNOSIS — R278 Other lack of coordination: Secondary | ICD-10-CM

## 2016-03-07 DIAGNOSIS — R4701 Aphasia: Secondary | ICD-10-CM | POA: Diagnosis not present

## 2016-03-07 DIAGNOSIS — R2689 Other abnormalities of gait and mobility: Secondary | ICD-10-CM

## 2016-03-07 NOTE — Therapy (Signed)
[facility] [address] [place], [place], [address] Phone: [phone]   Fax:  [phone]  Occupational Therapy Treatment  Patient Details  Name: [name] MRN: [id number] Date of Birth: 01-30-1961 Referring Provider: Dr. Erlinda Hong  Encounter Date: 03/07/2016      OT End of Session - 03/07/16 1715    Visit Number 7   Number of Visits 17   Date for OT Re-Evaluation 03/17/16   Authorization Type self pay, mcd pending   Authorization Time Period pt is checking on when she will be approved, therapist reiterated with pt on 03/07/16 that medicaid will not pay for visits until it is approved and she will likely be responsible for therapy visits   OT Start Time 1412  pt late   OT Stop Time 1445   OT Time Calculation (min) 33 min      Past Medical History:  Diagnosis Date  . Headache   . Hypertension   . Iron deficiency anemia   . Seizures ([place])   . Stroke [facility])     Past Surgical History:  Procedure Laterality Date  . CARDIAC CATHETERIZATION  04/2010   Dr. Terrence Dupont    There were no vitals filed for this visit.      Subjective Assessment - 03/07/16 1444    Subjective  Pt reports she slept on arm wrong   Pertinent History see epic - pt with significant shoulder pain   Patient Stated Goals reduce pain and be able to move right arm   Currently in Pain? Yes   Pain Score 8    Pain Location Shoulder   Pain Orientation Right   Pain Descriptors / Indicators Aching;Sore   Pain Type Chronic pain   Pain Frequency Intermittent   Aggravating Factors  malpositioning   Pain Relieving Factors repositioning   Multiple Pain Sites No       Treatment: Attempted shoulder flexion AA/ROM in supine with RUE, pt with significant pain. [place] 58mhz, 0.8 w/c 2, x continuous x 8 mins, with pt reporting decreased pain afterwards. Seated AA/ROM shoulder flexion and elbow extension low range with UE ranger, and gentle shoulder  abduction, min facilitation.                         OT Short Term Goals - 02/29/16 1613      OT SHORT TERM GOAL #1   Title I with inital HEP. due 02/17/16   Baseline dependent   Time 4   Period Weeks   Status On-going     OT SHORT TERM GOAL #2   Title Pt will verbalize understanding of RUE positioning to minimize pain and risk for injury.   Baseline dependent   Time 4   Period Weeks   Status Achieved     OT SHORT TERM GOAL #3   Title Pt will verbalize understanding of compensatory strategies for short term memory deficits.   Baseline dependent   Time 4   Period Weeks   Status Achieved     OT SHORT TERM GOAL #4   Title Pt will demonstrate 90 shoulder flexion with pain less than or equal to 4/10 for functional reach.   Baseline 70*   Time 4   Period Weeks   Status On-going     OT SHORT TERM GOAL #5   Title Pt will perform dressing modified independently.   Baseline min A   Time 4   Period Weeks  Status Achieved           OT Long Term Goals - 02/19/16 1252      OT LONG TERM GOAL #1   Title Pt will perform mod complex cooking/ home management at a modified indpendent level demonstrating good safety awareness. due 03/17/16   Baseline performing light home management and basic cooking with assistance   Time 8   Period Weeks   Status On-going     OT LONG TERM GOAL #2   Title Pt will demonstrate ability to perform tabletop and environmental scanning tasks with 95% better accuracy and no significant diplopia.   Baseline Pt reports difficulty reading due to significant diplopia, pt does not perform effective environmental scanning due to c/o of diplopia   Time 8   Period Weeks   Status On-going     OT LONG TERM GOAL #3   Title Pt will resume use of RUE as a non dominant assist with ADLs/ IADLs at least 50% of the time with pain no greater than 3/10   Baseline unable to use RUE consistently  due to severe pain   Time 8   Period Weeks    Status On-going     OT LONG TERM GOAL #4   Title Pt will demonstrate improved fine motor coordiantion as evidenced by decreasing RUE 9 hole peg test score by 4 secs from eval.   Baseline to be assessed   Time 8   Period Weeks   Status On-going     OT LONG TERM GOAL #5   Title Pt will demonstrate ability to perform  a physical and cognitive task simultaneously with 90% or better accuracy.   Baseline to be further assessed, impaired attention   Time 8   Period Weeks   Status On-going               Plan - 03/07/16 1714    Clinical Impression Statement Pt demonstrated decreased shoulder pain after use of ultrasound today.   Rehab Potential Fair   Clinical Impairments Affecting Rehab Potential ? education level, reduced frequency d/t no insurance coverage   OT Frequency 1x / week   OT Duration 8 weeks   OT Treatment/Interventions Self-care/ADL training;Fluidtherapy;Moist Heat;DME and/or AE instruction;Splinting;Patient/family education;Balance training;Therapeutic exercises;Therapeutic Higher education careers adviser;Therapeutic exercise;Ultrasound;Cryotherapy;Iontophoresis;Neuromuscular education;Functional Mobility Training;Passive range of motion;Cognitive remediation/compensation;Visual/perceptual remediation/compensation;Manual Therapy;Electrical Stimulation;Parrafin   Plan assess short term goals   Consulted and Agree with Plan of Care Patient      Patient will benefit from skilled therapeutic intervention in order to improve the following deficits and impairments:  Abnormal gait, Decreased coordination, Decreased range of motion, Impaired sensation, Increased edema, Decreased safety awareness, Decreased endurance, Decreased activity tolerance, Decreased knowledge of precautions, Pain, Impaired UE functional use, Decreased balance, Decreased cognition, Decreased mobility, Decreased strength, Impaired vision/preception, Impaired perceived functional ability  Visit  Diagnosis: 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 Obstructive sleep apnea 11/29/2015  . Right shoulder pain 11/29/2015  . Adjustment disorder with depressed mood 10/19/2015  . Nocturnal enuresis   . Hypokalemia   . Anemia, iron deficiency   . Super obese ([place])   . Prediabetes   . Orthostatic hypotension   . Supplemental oxygen dependent   . Diastolic dysfunction   . Tachypnea   . AKI (acute kidney  injury) ([place])   . Acute blood loss anemia   . Lethargy   . Wheezing   . Benign essential HTN   . ICH (intracerebral hemorrhage) ([place]) 10/06/2015    Siyana Erney 03/07/2016, 5:22 PM  [facility] [address] [place], [place], [address] Phone: [phone]   Fax:  [phone]  Name: TWANYA BLACKETER MRN: [id number] Date of Birth: 20-May-1960

## 2016-03-07 NOTE — Therapy (Signed)
[facility] [address] [place], [place], [address] Phone: [phone]   Fax:  [phone]  Patient Details  Name: [name] MRN: [id number] Date of Birth: Feb 10, 1961 Referring Provider: Rosalin Hawking, MD  Encounter Date: 03/07/2016  SLP agreed with pt that her most pressing needs in therapy are balance and use of her UE, specifically her RUE. Since pt is essentially WNL in conversation, she and SLP agreed today pt would best to be d/c'd from [facility] at this time and use those visits for OT and PT.  SPEECH THERAPY DISCHARGE SUMMARY  Visits from Start of Care: 7  Current functional level related to goals / functional outcomes: See her STGs and LTGs, last addressed on 02-29-16     SLP Short Term Goals - 02/29/16 1359      SLP SHORT TERM GOAL #1   Title pt will incr speech volume to average 69dB for 18/20 sentence responses over three sessions   Baseline adequate vocal intensity for quiet environment   Time 1   Status Achieved     SLP SHORT TERM GOAL #2   Title pt will name 8-10 items in mod complex category with modified independence over three sessions   Baseline 11/6, 12/7,   Time 1   Period Weeks   Status On-going     SLP SHORT TERM GOAL #3   Title pt will demo [facility] 5 minutes mod-complex conversation with rare min A over two sessions   Baseline 15 minute conversation with no cues 01/30/16, 12/7   Period Weeks   Status Achieved     SLP SHORT TERM GOAL #4   Title Pt to complete moderately complex reasoning tasks at mod I level and >80% acc over 2 sessions.   Time 1   Period Weeks   Status On-going         SLP Long Term Goals - 02/29/16 1401      SLP LONG TERM GOAL #1   Title pt will demo WFL 10 minutes mod complex conversation with modified independence (using compensations) in three sessions   Baseline 15 minute conversation wtih independent use of strategies to compensate for word finding errors   Time 4    Period Weeks   Status On-going     SLP LONG TERM GOAL #2   Title pt will name at least average of 6 items in complex/abstract category over three sessions   Time 4   Period Weeks   Status On-going     SLP LONG TERM GOAL #3   Title pt will incr speech volume in 10 minutes mod complex conversation to average 70dB over three sessions   Baseline 15 minute conversation with adequate vocal intensity in quiet environment.   Time 4   Period Weeks   Status On-going         Remaining deficits: Decr'd loudness, intermittent. Pt agrees her language skills in conversation are WNL at this time.   Education / Equipment: Compensations for word finding.  Plan: Patient agrees to discharge.  Patient goals were partially met. Patient is being discharged due to being pleased with the current functional level.  ?????       [place] ,MS, CCC-SLP  03/07/2016, 3:25 PM  [facility] [address] [place], [place], [address] Phone: [phone]   Fax:  [phone]

## 2016-03-08 NOTE — Therapy (Signed)
[facility] [address] [place], [place], [address] Phone: [phone]   Fax:  [phone]  Physical Therapy Treatment  Patient Details  Name: [name] MRN: [id number] Date of Birth: Dec 17, 1960 Referring Provider: Rosalin Hawking, MD  Encounter Date: 03/07/2016      PT End of Session - 03/07/16 1540    Visit Number 10   Number of Visits 17   Date for PT Re-Evaluation 03/18/16   Authorization Type Possible MCD-waiting to get number from pt (understands that she is self pay until then)   PT Start Time 1533   PT Stop Time 1614   PT Time Calculation (min) 41 min   Equipment Utilized During Treatment Gait belt   Activity Tolerance Patient tolerated treatment well   Behavior During Therapy [facility] for tasks assessed/performed      Past Medical History:  Diagnosis Date  . Headache   . Hypertension   . Iron deficiency anemia   . Seizures ([place])   . Stroke [facility])     Past Surgical History:  Procedure Laterality Date  . CARDIAC CATHETERIZATION  04/2010   Dr. Terrence Dupont    There were no vitals filed for this visit.      Subjective Assessment - 03/07/16 1539    Subjective No new complaints. No falls to report. Still with shoulder pain, OT is addressing this issue. No falls, some stumbles "I bump the walls"   Limitations House hold activities;Walking   Currently in Pain? Yes   Pain Score 6    Pain Location Shoulder   Pain Orientation Right   Pain Descriptors / Indicators Aching;Sore   Pain Type Chronic pain   Pain Frequency Intermittent   Aggravating Factors  malpositioning   Pain Relieving Factors repositioning             [facility] Adult PT Treatment/Exercise - 03/07/16 1540      Transfers   Transfers Sit to Stand;Stand to Sit   Sit to Stand 6: Modified independent (Device/Increase time);With upper extremity assist;From bed;From chair/3-in-1   Stand to Sit 6: Modified independent (Device/Increase time);With  upper extremity assist;To bed;To chair/3-in-1     Ambulation/Gait   Ambulation/Gait Yes   Ambulation/Gait Assistance 4: Min guard;5: Supervision   Ambulation/Gait Assistance Details cues on posture, for larger step length and for reciprocal arm swing   Ambulation Distance (Feet) 230 Feet  x1; 150 x1   Assistive device None   Gait Pattern Step-through pattern;Decreased stride length;Narrow base of support;Decreased arm swing - right;Decreased arm swing - left   Ambulation Surface Level;Indoor   Gait Comments seated rest break between gait trails due to fatigue     High Level Balance   High Level Balance Activities Marching forwards;Marching backwards;Tandem walking;Head turns  tandem fwd/bwd, toe fwd/bwd, heel fwd/bwd, cross over x1   High Level Balance Comments at counter top with intermittent UE support: 2-3 laps each with min guard assist for balance, cues on ex form/technique as well; also performed gait along counter top with head movements left<>right, both fwd/bwd, with min guard assist.                               PT Short Term Goals - 02/19/16 1237      PT SHORT TERM GOAL #1   Title Pt will initiate HEP in order to indicate improved functional mobility.  (Target Date: 02/15/16)   Baseline 02/19/2016 Met  Time --   Period --   Status Achieved     PT SHORT TERM GOAL #2   Title Will assess BERG balance test and improve score by 4 points in order to indicate decreased fall risk.     Baseline 02/19/16 Scored 36 today   Time --   Period --   Status Achieved     PT SHORT TERM GOAL #3   Title Pt will improve gait speed to 1.58 ft/sec in order to indicate decreased fall risk and improved efficiency of gait.     Baseline 11/27- improved to 1.22 ft/sec, not to goal   Time --   Period --   Status Partially Met     PT SHORT TERM GOAL #4   Title Will assess 6MWT and improve distance by 150' in order to indicate improved functional endurance.     Baseline 02/12/16 Pt  ambulated 500 ft during 6MWT   Time --   Period --   Status Achieved     PT SHORT TERM GOAL #5   Title Pt will ambulate 300' w/ LRAD at mod I level over indoor surfaces in order to indicate safe home negotiation.     Baseline 11/27: 335 w/ SPC at min guard   Time 4   Period --   Status Partially Met           PT Long Term Goals - 01/18/16 1633      PT LONG TERM GOAL #1   Title Pt will be independent with HEP in order to indicate improved functional mobility.  (Target Date: 03/14/16)   Baseline dependent   Time 8   Period Weeks   Status New     PT LONG TERM GOAL #2   Title Pt will improve BERG balance test score 8 points from baseline in order to indicate decreased fall risk.     Baseline unable to assess due to time constraint   Time 8   Period Weeks   Status New     PT LONG TERM GOAL #3   Title Pt will increase gait speed to 2.18 ft/sec w/ LRAD in order to indicate decreased fall risk and improved efficiency of gait.     Baseline .98 ft/sec   Time 8   Period Weeks   Status New     PT LONG TERM GOAL #4   Title Pt will verbalize return to community fitness program in order to indicate ongoing work on functional endurance.     Baseline currently not involved in program.    Time 8   Period Weeks   Status New     PT LONG TERM GOAL #5   Title Pt will ambulate 500' w/ LRAD at mod I level over unlevel paved surfaces (including ramp/curb) in order to indicate safe return to community.     Baseline min/guard 100' w/ SPC over indoor surfaces   Time 8   Period Weeks   Status New              Plan - 03/07/16 1540    Clinical Impression Statement today's skilled session continued to work on high level balance activites and gait without AD. Pt safe to ambulate around home without AD. Need to assess on unlevel surfaces before completely putting cane away. Pt is making steady progress toward goals and should benefit from continued PT to progress toward unmet goals.  Rehab Potential Good   Clinical Impairments Affecting Rehab Potential possible visit limitations   PT Frequency 2x / week   PT Duration 8 weeks   PT Treatment/Interventions ADLs/Self Care Home Management;Electrical Stimulation;DME Instruction;Gait training;Stair training;Functional mobility training;Therapeutic activities;Neuromuscular re-education;Balance training;Therapeutic exercise;Patient/family education;Orthotic Fit/Training;Vestibular   PT Next Visit Plan continue gait/RLE strengthening, high level balance activities, proper technique for picking up objects off floor.   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 Obstructive sleep apnea 11/29/2015  . Right shoulder pain 11/29/2015  . Adjustment disorder with depressed mood 10/19/2015  . Nocturnal enuresis   . Hypokalemia   . Anemia, iron deficiency   . Super obese ([place])   . Prediabetes   . Orthostatic hypotension   . Supplemental oxygen dependent   . Diastolic dysfunction   . Tachypnea   . AKI (acute kidney injury) ([place])   . Acute blood loss anemia   . Lethargy   . Wheezing   . Benign essential HTN   . ICH (intracerebral hemorrhage) ([place]) 10/06/2015    Willow Ora,  PTA, [facility] [address] [place], [place] [address] [phone] 03/08/16, 5:03 PM   Name: [name] MRN: [id number] Date of Birth: 1960/12/13

## 2016-03-12 ENCOUNTER — Ambulatory Visit: Payer: Medicaid Other | Admitting: Physical Therapy

## 2016-03-12 ENCOUNTER — Encounter: Payer: Self-pay | Admitting: Occupational Therapy

## 2016-03-14 ENCOUNTER — Ambulatory Visit: Payer: Medicaid Other | Admitting: Rehabilitation

## 2016-03-14 ENCOUNTER — Encounter: Payer: Self-pay | Admitting: Occupational Therapy

## 2016-03-14 ENCOUNTER — Encounter: Payer: Self-pay | Admitting: Rehabilitation

## 2016-03-14 ENCOUNTER — Ambulatory Visit: Payer: Medicaid Other | Admitting: Occupational Therapy

## 2016-03-14 DIAGNOSIS — R4701 Aphasia: Secondary | ICD-10-CM | POA: Diagnosis not present

## 2016-03-14 DIAGNOSIS — R278 Other lack of coordination: Secondary | ICD-10-CM

## 2016-03-14 DIAGNOSIS — R41842 Visuospatial deficit: Secondary | ICD-10-CM

## 2016-03-14 DIAGNOSIS — M6281 Muscle weakness (generalized): Secondary | ICD-10-CM

## 2016-03-14 DIAGNOSIS — I69253 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 non-dominant side: Secondary | ICD-10-CM

## 2016-03-14 DIAGNOSIS — M25511 Pain in right shoulder: Secondary | ICD-10-CM

## 2016-03-14 DIAGNOSIS — R2689 Other abnormalities of gait and mobility: Secondary | ICD-10-CM

## 2016-03-14 DIAGNOSIS — R2681 Unsteadiness on feet: Secondary | ICD-10-CM

## 2016-03-14 DIAGNOSIS — I69115 Cognitive social or emotional deficit following nontraumatic intracerebral hemorrhage: Secondary | ICD-10-CM

## 2016-03-14 NOTE — Therapy (Signed)
[facility] [address] [place], [place], [address] Phone: [phone]   Fax:  [phone]  Physical Therapy Treatment and D/C Summary  Patient Details  Name: [name] MRN: [id number] Date of Birth: [birth year] Referring Provider: Rosalin Hawking, MD  Encounter Date: 03/14/2016      PT End of Session - 03/14/16 1944    Visit Number 11   Number of Visits 17   Date for PT Re-Evaluation 03/18/16   Authorization Type Possible MCD-waiting to get number from pt (understands that she is self pay until then)   PT Start Time 1447   PT Stop Time 1530   PT Time Calculation (min) 43 min   Equipment Utilized During Treatment Gait belt   Activity Tolerance Patient tolerated treatment well   Behavior During Therapy [facility] for tasks assessed/performed      Past Medical History:  Diagnosis Date  . Headache   . Hypertension   . Iron deficiency anemia   . Seizures ([place])   . Stroke [facility])     Past Surgical History:  Procedure Laterality Date  . CARDIAC CATHETERIZATION  04/2010   Dr. Terrence Dupont    There were no vitals filed for this visit.      Subjective Assessment - 03/14/16 1936    Subjective Pt with no new complaints, states she has not heard from Medicaid, states she did get food stamps.     Limitations House hold activities;Walking   Currently in Pain? Yes   Pain Score 4    Pain Location Shoulder   Pain Orientation Right   Pain Descriptors / Indicators Aching   Pain Type Chronic pain   Pain Onset More than a month ago   Pain Frequency Intermittent   Aggravating Factors  malpositioning   Pain Relieving Factors repositioning                         [facility] Adult PT Treatment/Exercise - 03/14/16 1502      Ambulation/Gait   Ambulation/Gait Yes   Ambulation/Gait Assistance 6: Modified independent (Device/Increase time)   Ambulation/Gait Assistance Details Pt at mod I level over indoor surfaces up to  200' however would recommend S for outdoor surfaces due to imbalance.  Also looked at gait with use of rollator during session to assess if this would assist in improving gait speed.  Note that this indeed improved gait speed and pt seemed to have increased confidence with this device.  Discussed that she should check with local church or [employer] to see about getting rollator for community use.  Pt verbalized understanding.     Ambulation Distance (Feet) 200 Feet  then another 150   Assistive device 4-wheeled walker  pts quad tip cane   Gait Pattern Step-through pattern;Decreased stride length;Narrow base of support;Decreased arm swing - right;Decreased arm swing - left   Ambulation Surface Level;Indoor   Gait velocity 1.37 ft/sec w/ rollator, 1.27 ft/sec with cane   Ramp 5: Supervision   Ramp Details (indicate cue type and reason) Cues for sequencing with cane   Curb 4: Min assist;3: Mod assist   Curb Details (indicate cue type and reason) Cues for sequencing and technique however still requires up to mod A for safety.       Standardized Balance Assessment   Standardized Balance Assessment Berg Balance Test     Berg Balance Test   Sit to Stand Able to stand without using hands and stabilize independently  Standing Unsupported Able to stand safely 2 minutes   Sitting with Back Unsupported but Feet Supported on Floor or Stool Able to sit safely and securely 2 minutes   Stand to Sit Sits safely with minimal use of hands   Transfers Able to transfer safely, minor use of hands   Standing Unsupported with Eyes Closed Able to stand 10 seconds safely   Standing Ubsupported with Feet Together Able to place feet together independently and stand for 1 minute with supervision   From Standing, Reach Forward with Outstretched Arm Can reach forward >12 cm safely (5")   From Standing Position, Pick up Object from [place] to pick up shoe, needs supervision   From Standing Position, Turn to Look Behind  Over each Shoulder Looks behind from both sides and weight shifts well   Turn 360 Degrees Needs close supervision or verbal cueing   Standing Unsupported, Alternately Place Feet on Step/Stool Able to complete 4 steps without aid or supervision   Standing Unsupported, One Foot in [place] to plae foot ahead of the other independently and hold 30 seconds   Standing on One Leg Able to lift leg independently and hold equal to or more than 3 seconds   Total Score 45                PT Education - 03/14/16 1943    Education provided Yes   Education Details Discussed trying to get rollator from church/[employer], building up endurance, getting a new order for PT in a couple of months (if hears from MCD), community fitness   Person(s) Educated Patient   Methods Explanation   Comprehension Verbalized understanding          PT Short Term Goals - 02/19/16 1237      PT SHORT TERM GOAL #1   Title Pt will initiate HEP in order to indicate improved functional mobility.  (Target Date: 02/15/16)   Baseline 02/19/2016 Met   Time --   Period --   Status Achieved     PT SHORT TERM GOAL #2   Title Will assess BERG balance test and improve score by 4 points in order to indicate decreased fall risk.     Baseline 02/19/16 Scored 36 today   Time --   Period --   Status Achieved     PT SHORT TERM GOAL #3   Title Pt will improve gait speed to 1.58 ft/sec in order to indicate decreased fall risk and improved efficiency of gait.     Baseline 11/27- improved to 1.22 ft/sec, not to goal   Time --   Period --   Status Partially Met     PT SHORT TERM GOAL #4   Title Will assess 6MWT and improve distance by 150' in order to indicate improved functional endurance.     Baseline 02/12/16 Pt ambulated 500 ft during 6MWT   Time --   Period --   Status Achieved     PT SHORT TERM GOAL #5   Title Pt will ambulate 300' w/ LRAD at mod I level over indoor surfaces in order to indicate safe home  negotiation.     Baseline 11/27: 335 w/ SPC at min guard   Time 4   Period --   Status Partially Met           PT Long Term Goals - 03/14/16 1455      PT LONG TERM GOAL #1   Title Pt will be independent with  HEP in order to indicate improved functional mobility.  (Target Date: 30-Mar-2016)   Baseline dependent   Time 8   Period Weeks   Status Achieved     PT LONG TERM GOAL #2   Title Pt will improve BERG balance test score 8 points from baseline in order to indicate decreased fall risk.     Baseline 45/56 on 03-30-2016   Time 8   Period Weeks   Status Achieved     PT LONG TERM GOAL #3   Title Pt will increase gait speed to 2.18 ft/sec w/ LRAD in order to indicate decreased fall risk and improved efficiency of gait.     Baseline 1.27 ft/sec with cane, 1.37 ft/sec with rollator   Time 8   Period Weeks   Status Partially Met     PT LONG TERM GOAL #4   Title Pt will verbalize return to community fitness program in order to indicate ongoing work on functional endurance.     Baseline not involved in formal program due to financial restrictions, but is progressing herself with walking program and HEP   Time 8   Period Weeks   Status Achieved     PT LONG TERM GOAL #5   Title Pt will ambulate 500' w/ LRAD at mod I level over unlevel paved surfaces (including ramp/curb) in order to indicate safe return to community.     Baseline mod I 200' w/ SPC over indoor surfaces and min/mod A for ramp/curb, recommend S for outdoor surfaces.    Time 8   Period Weeks   Status Partially Met               Plan - 03/30/2016 1946    Clinical Impression Statement Skilled session focused on assessment of LTGs.  Pt has met 3/5 LTGs and partially meeting remaining two goals.  Feel that pt is at a level to work on mobility and endurance at home as she still has not heard from MCD at this time.  Will D/C pt and have her return in a couple of months following seeing [name].  Pt verbalized  understanding.     Rehab Potential Good   Clinical Impairments Affecting Rehab Potential possible visit limitations   PT Frequency 2x / week   PT Duration 8 weeks   PT Treatment/Interventions ADLs/Self Care Home Management;Electrical Stimulation;DME Instruction;Gait training;Stair training;Functional mobility training;Therapeutic activities;Neuromuscular re-education;Balance training;Therapeutic exercise;Patient/family education;Orthotic Fit/Training;Vestibular   Consulted and Agree with Plan of Care Patient;Family member/caregiver   Family Member Consulted husband      Patient will benefit from skilled therapeutic intervention in order to improve the following deficits and impairments:  Abnormal gait, Decreased activity tolerance, Decreased balance, Decreased mobility, Decreased strength, Dizziness, Impaired perceived functional ability, Impaired flexibility, Improper body mechanics, Postural dysfunction, Impaired UE functional use  Visit Diagnosis: 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)  Muscle weakness (generalized)  Other abnormalities of gait and mobility  Unsteadiness on feet       G-Codes - 03-30-2016 1948    Functional Assessment Tool Used Gait speed 1.37 ft/sec with rollator, 1.27 ft/sec with quad tip cane at mod I level    Functional Limitation Mobility: Walking and moving around   Mobility: Walking and Moving Around Current Status [phone]) At least 1 percent but less than 20 percent impaired, limited or restricted   Mobility: Walking and Moving Around Goal Status ([id number]) At least 1 percent but less than 20 percent impaired,  limited or restricted   Mobility: Walking and Moving Around Discharge Status [phone]) At least 1 percent but less than 20 percent impaired, limited or restricted      PHYSICAL THERAPY DISCHARGE SUMMARY  Visits from Start of Care: 11  Current functional level related to  goals / functional outcomes: See LTGs   Remaining deficits: Pt continues to have balance, overall strength, and endurance deficits but has HEP to address these.  Feel she is at a point to work on these things at home until she has coverage.     Education / Equipment: HEP, walking program  Plan: Patient agrees to discharge.  Patient goals were partially met. Patient is being discharged due to meeting the stated rehab goals.  ?????        Problem List Patient Active Problem List   Diagnosis Date Noted  . Obstructive sleep apnea 11/29/2015  . Right shoulder pain 11/29/2015  . Adjustment disorder with depressed mood 10/19/2015  . Nocturnal enuresis   . Hypokalemia   . Anemia, iron deficiency   . Super obese ([place])   . Prediabetes   . Orthostatic hypotension   . Supplemental oxygen dependent   . Diastolic dysfunction   . Tachypnea   . AKI (acute kidney injury) ([place])   . Acute blood loss anemia   . Lethargy   . Wheezing   . Benign essential HTN   . ICH (intracerebral hemorrhage) ([place]) 10/06/2015    Cameron Sprang, PT, MPT [facility] [address] [place], [place], [address] Phone: [phone]   Fax:  [phone] 03/14/16, 7:52 PM  Name: [name] MRN: [id number] Date of Birth: 1960/05/06

## 2016-03-14 NOTE — Therapy (Signed)
[facility] [address] [place], [place], [address] Phone: [phone]   Fax:  [phone]  Occupational Therapy Treatment  Patient Details  Name: [name] MRN: [id number] Date of Birth: 24-Jun-1960 Referring Provider: Dr. Erlinda Hong  Encounter Date: 03/14/2016      OT End of Session - 03/14/16 1523    Visit Number 8   Number of Visits 17   Date for OT Re-Evaluation 03/17/16   Authorization Type self pay, mcd pending   Authorization Time Period pt is checking on when she will be approved, therapist reiterated with pt on 03/07/16 that medicaid will not pay for visits until it is approved and she will likely be responsible for therapy visits   OT Start Time 1403   OT Stop Time 1445   OT Time Calculation (min) 42 min   Activity Tolerance Patient tolerated treatment well      Past Medical History:  Diagnosis Date  . Headache   . Hypertension   . Iron deficiency anemia   . Seizures ([place])   . Stroke [facility])     Past Surgical History:  Procedure Laterality Date  . CARDIAC CATHETERIZATION  04/2010   Dr. Terrence Dupont    There were no vitals filed for this visit.      Subjective Assessment - 03/14/16 1407    Subjective  I decorated my whole house for Christmas   Pertinent History see epic - pt with significant shoulder pain   Patient Stated Goals reduce pain and be able to move right arm   Currently in Pain? Yes   Pain Score 5    Pain Location Shoulder   Pain Orientation Right   Pain Descriptors / Indicators Aching   Pain Type Chronic pain   Pain Onset More than a month ago   Pain Frequency Intermittent   Aggravating Factors  malpositioning   Pain Relieving Factors repositioning                      OT Treatments/Exercises (OP) - 03/14/16 0001      ADLs   Home Maintenance Pt reports she is doing most of the cooking "except I don't lift heavy hot stuff out of the oven because my arm is weak."   Discussed HEP and pt has not been doing exercises - reviewed exercises with pt and she stated "I didn't realize those were to make my arm stronger"  Explained to pt that she will need to complete HEP in order to strengthen her arm. Pt verbalized understanding.    ADL Comments Reassessed long term goals - see goal section for details.  Pt reports today that she uses her RUE in functional tasks (dressing, bathing, homemaking) about 50% of the time with no pain "but if I use it any more than it throbs."  Pt also stated "I usually don't have double vision any more but I had it the other night and it was bad - I had to go to bed since my head was hurting"  Discussed that double vision after a stroke is usually consistently  evident in certain fields and doesn't usually stop completely and then suddenly start for a short duration.  Pt responded that the double vision "really doesn't bother me any more."       Visual/Perceptual Exercises   Other Exercises Visual scanning for single but changing stimulus at 70M level -pt with 100% accuracy. Pt required increased time and often stopped to ask  questions.  Min vc's to redirect back to task.  Pt removed glasses and used finger to guide - "I use my finger so I don't lose my spot."                    OT Short Term Goals - 03/14/16 1421      OT SHORT TERM GOAL #1   Title I with inital HEP. due 02/17/16   Baseline dependent   Time 4   Period Weeks   Status Achieved     OT SHORT TERM GOAL #2   Title Pt will verbalize understanding of RUE positioning to minimize pain and risk for injury.   Baseline dependent   Time 4   Period Weeks   Status Achieved     OT SHORT TERM GOAL #3   Title Pt will verbalize understanding of compensatory strategies for short term memory deficits.   Baseline dependent   Time 4   Period Weeks   Status Achieved     OT SHORT TERM GOAL #4   Title Pt will demonstrate 90 shoulder flexion with pain less than or equal to 4/10  for functional reach.   Baseline 70*   Time 4   Period Weeks   Status On-going     OT SHORT TERM GOAL #5   Title Pt will perform dressing modified independently.   Baseline min A   Time 4   Period Weeks   Status Achieved           OT Long Term Goals - 03/14/16 1420      OT LONG TERM GOAL #1   Title Pt will perform mod complex cooking/ home management at a modified indpendent level demonstrating good safety awareness. due 03/17/16   Baseline performing light home management and basic cooking with assistance   Time 8   Period Weeks   Status On-going     OT LONG TERM GOAL #2   Title Pt will demonstrate ability to perform tabletop and environmental scanning tasks with 95% better accuracy and no significant diplopia.   Baseline Pt reports difficulty reading due to significant diplopia, pt does not perform effective environmental scanning due to c/o of diplopia   Time 8   Period Weeks   Status Achieved  100% accuracy with increased time and used finger as guide     OT LONG TERM GOAL #3   Title Pt will resume use of RUE as a non dominant assist with ADLs/ IADLs at least 50% of the time with pain no greater than 3/10   Baseline unable to use RUE consistently  due to severe pain   Time 8   Period Weeks   Status Achieved  per pt report     OT LONG TERM GOAL #4   Title Pt will demonstrate improved fine motor coordiantion as evidenced by decreasing RUE 9 hole peg test score by 4 secs from eval.   Baseline to be assessed   Time 8   Period Weeks   Status Achieved  24.55     OT LONG TERM GOAL #5   Title Pt will demonstrate ability to perform  a physical and cognitive task simultaneously with 90% or better accuracy.   Baseline to be further assessed, impaired attention   Time 8   Period Weeks   Status On-going               Plan - 03/14/16 1521    Clinical Impression Statement Pt  progressing toward goals. Perceived ability and perceived abilty to use RUE  functionally appears inconsistent for this patient. Pt encouraged to be compliant with HEP and to be as independent as possible.    Rehab Potential Fair   Clinical Impairments Affecting Rehab Potential ? education level, reduced frequency d/t no insurance coverage   OT Frequency 1x / week   OT Duration 8 weeks   OT Treatment/Interventions Self-care/ADL training;Fluidtherapy;Moist Heat;DME and/or AE instruction;Splinting;Patient/family education;Balance training;Therapeutic exercises;Therapeutic Higher education careers adviser;Therapeutic exercise;Ultrasound;Cryotherapy;Iontophoresis;Neuromuscular education;Functional Mobility Training;Passive range of motion;Cognitive remediation/compensation;Visual/perceptual remediation/compensation;Manual Therapy;Electrical Stimulation;Parrafin   Plan assess remaining LTG's, ?d/c   Consulted and Agree with Plan of Care Patient      Patient will benefit from skilled therapeutic intervention in order to improve the following deficits and impairments:  Abnormal gait, Decreased coordination, Decreased range of motion, Impaired sensation, Increased edema, Decreased safety awareness, Decreased endurance, Decreased activity tolerance, Decreased knowledge of precautions, Pain, Impaired UE functional use, Decreased balance, Decreased cognition, Decreased mobility, Decreased strength, Impaired vision/preception, Impaired perceived functional ability  Visit Diagnosis: 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 Obstructive sleep apnea 11/29/2015  . Right shoulder pain 11/29/2015  . Adjustment disorder with depressed mood  10/19/2015  . Nocturnal enuresis   . Hypokalemia   . Anemia, iron deficiency   . Super obese ([place])   . Prediabetes   . Orthostatic hypotension   . Supplemental oxygen dependent   . Diastolic dysfunction   . Tachypnea   . AKI (acute kidney injury) ([place])   . Acute blood loss anemia   . Lethargy   . Wheezing   . Benign essential HTN   . ICH (intracerebral hemorrhage) ([place]) 10/06/2015    Quay Burow, OTR/L 03/14/2016, 3:25 PM  [facility] [address] [place], [place], [address] Phone: [phone]   Fax:  [phone]  Name: ADAISHA HAREWOOD MRN: [id number] Date of Birth: February 02, 1961

## 2016-03-20 ENCOUNTER — Ambulatory Visit: Payer: Medicaid Other | Admitting: Occupational Therapy

## 2016-03-20 ENCOUNTER — Ambulatory Visit: Payer: Self-pay | Admitting: Physical Therapy

## 2016-03-20 MED FILL — CHLORTHALIDONE 25 MG TABLET: 25 | 30 days supply | Qty: 30 | Fill #1

## 2016-03-20 MED FILL — METFORMIN HCL ER 500 MG TAB: 500 | 30 days supply | Qty: 30 | Fill #1

## 2016-03-20 MED FILL — levETIRAcetam 750 MG TABS: 750 | 30 days supply | Qty: 60 | Fill #1

## 2016-03-20 MED FILL — METOPROLOL TARTRATE 50 MG T: 50 | 30 days supply | Qty: 60 | Fill #2

## 2016-03-22 ENCOUNTER — Ambulatory Visit: Payer: Self-pay | Admitting: Rehabilitation

## 2016-04-03 ENCOUNTER — Encounter: Payer: Self-pay | Admitting: Neurology

## 2016-04-03 ENCOUNTER — Ambulatory Visit (INDEPENDENT_AMBULATORY_CARE_PROVIDER_SITE_OTHER): Payer: Self-pay | Admitting: Neurology

## 2016-04-03 VITALS — BP 132/78 | HR 72 | Ht 59.0 in | Wt 265.0 lb

## 2016-04-03 DIAGNOSIS — I611 Nontraumatic intracerebral hemorrhage in hemisphere, cortical: Secondary | ICD-10-CM

## 2016-04-03 DIAGNOSIS — M25511 Pain in right shoulder: Secondary | ICD-10-CM

## 2016-04-03 DIAGNOSIS — E669 Obesity, unspecified: Secondary | ICD-10-CM

## 2016-04-03 DIAGNOSIS — G4733 Obstructive sleep apnea (adult) (pediatric): Secondary | ICD-10-CM

## 2016-04-03 DIAGNOSIS — I10 Essential (primary) hypertension: Secondary | ICD-10-CM

## 2016-04-03 DIAGNOSIS — G8929 Other chronic pain: Secondary | ICD-10-CM

## 2016-04-03 MED ORDER — ASPIRIN EC 81 MG PO TBEC: 81.0000 mg | DELAYED_RELEASE_TABLET | Freq: Every day | ORAL | Status: DC

## 2016-04-03 NOTE — Patient Instructions (Addendum)
-   will initiate ASA 81mg  daily for stroke prevention - follow up with Dr. Lottie Rater for rehab and shoulder pain - working on [employer], once approved, will do sleep study for sleep apnea evaluation.  - Follow up with your primary care physician for stroke risk factor modification. Recommend maintain blood pressure goal <130/80, diabetes with hemoglobin A1c goal below 7.0% and lipids with LDL cholesterol goal below 70 mg/dL.  - check BP at home and record - continue outpt waterobic therapy   - continue the keppra for seizure control - you are seizure free for 6 months now, and you are able to drive now. However, if seizure recurs, you can not drive again for 6 months.   - Please maintain seizure precautions.  - follow up in 6 months

## 2016-04-03 NOTE — Progress Notes (Signed)
STROKE NEUROLOGY FOLLOW UP NOTE  NAME: [name] DOB: 1960-06-09  REASON FOR VISIT: stroke follow up HISTORY FROM: [name] and daughter and chart  Today we had Mary pleasure of seeing [name] in follow-up at our Neurology Clinic. [name] was accompanied by daughter.   History Summary [name] is a 56 y.o. female with history of morbid obesity and hypertension admitted on 10/06/15 for LOC and GTC seizure. CT showed right temporal small ICH, and repeat CT stable hematoma. CTA and CTV head without AVM or CVT. CUS, DVT screening and TTE unremarkable. LTM  EEG no seizure. LD 58 and A1C 5.7. [name] was put on keppra 750mg  bid. [name] was discharged to CIR with planning outpt sleep study.   11/29/15 follow up - [name] has been doing well. No seizure activity. [name] is not driving so far. However, [name] complains of right shoulder pain with limited ROM at right UE. Walks with cane at home. BP today 137/78 and also stable at home. BP controlled well with 2 BP meds.  Interval History During Mary interval time, [name] has been doing well. Had MRI and MRA in 11/2015 showed resolved bleeding and no AVM or aneurysm. He followed with [name] for right shoulder pain and was put on Voltaren gel which helped. [name] continued on keppra and no more seizures. [name] complains of turning head back from right lateral position fast causing dizzy, but it turning slow then no dizziness. Currently sleep study on hold due to no insurance. Still has outpt [name]. BP today 132/78.  REVIEW OF SYSTEMS: Full 14 system review of systems performed and notable only for those listed below and in HPI above, all others are negative:  Constitutional:   Cardiovascular:  Ear/Nose/Throat:   Skin:  Eyes: blurry vision   Respiratory:   Gastroitestinal:  Diarrhea, black stool on iron Genitourinary:  Hematology/Lymphatic:   Endocrine:  Musculoskeletal:  Aching muscles, joint pain, back pain Allergy/Immunology:   Neurological:  Dizziness,  HA, weakness Psychiatric: confusion, depression, anxious Sleep: snoring  Mary following represents [name]'s updated allergies and side effects list: Allergies  Allergen Reactions  . Eggs Or Egg-Derived Products   . Gadolinium Derivatives Itching    Immediately after gad injection [name]. complained of tongue numbness and lower lip/ then had rt side of body tingling/ [name]. Was assessed by Dr. Jeralyn Ruths before leaving/ no medications were administered/    Mary neurologically relevant items on [name]'s problem list were reviewed on today's visit.  Neurologic Examination  A problem focused neurological exam (12 or more points of Mary single system neurologic examination, vital signs counts as 1 point, cranial nerves count for 8 points) was performed.  Blood pressure 132/78, pulse 72, height 4\' 11"  (1.499 m), weight 265 lb (120.2 kg), last menstrual period 02/17/2011.  General - morbid obesity, well developed, in no apparent distress.  Ophthalmologic - Fundi not visualized due to small pupils.  Cardiovascular - Regular rate and rhythm with no murmur.  Mental Status -  Level of arousal and orientation to time, place, and person were intact. Language including expression, naming, repetition, comprehension was assessed and found intact. Fund of Knowledge was assessed and was intact.  Cranial Nerves II - XII - II - Visual field intact OU. III, IV, VI - Extraocular movements intact. V - Facial sensation intact bilaterally. VII - Facial movement intact bilaterally. VIII - Hearing & vestibular intact bilaterally. X - Palate elevates symmetrically. XI - Chin turning &  shoulder shrug intact bilaterally. XII - Tongue protrusion intact.  Motor Strength - [name]'s strength was RUE 4+/5 due to mild shoulder pain, and RLE 5/5, LUE and LLE 5/5.  Bulk was normal and fasciculations were absent.   Motor Tone - Muscle tone was assessed at Mary neck and appendages and was normal.  Reflexes - Mary  [name]'s reflexes were 1+ in all extremities and [name] had no pathological reflexes.  Sensory - Light touch, temperature/pinprick were assessed and were normal.    Coordination - [name] had normal movements in Mary hands with no ataxia or dysmetria.  Tremor was absent.  Gait and Station - walk with cane, slow, broad base gait.   Data reviewed: I personally reviewed Mary images and agree with Mary radiology interpretations.  Ct Head Wo Contrast 10/07/2015  1. Similar size of parenchymal hemorrhage centered at Mary posterior right temporal lobe, estimated volume 2.2 cc. Minimal localized edema without significant mass effect.  2. Moderate chronic microvascular ischemic disease.   10/06/2015  1. Examination is positive for a hyperdense mass within Mary right posterior temporoparietal lobe. In Mary acute setting finding is concerning for a focal hemorrhagic infarct.  2. Mild chronic small vessel ischemic change.  10/08/2015  2 x 1.9 x 1.3 cm (volume = 2.6 cc) posterior right temporal lobe hemorrhage without significant change from prior examination (when utilizing similar landmarks). Mary degree of surrounding vasogenic edema is minimally more prominent. No new intracranial hemorrhage.   CTA head and neck  10/09/2015 1. Negative for dural venous sinus thrombosis. Mary bilateral transverse and sigmoid sinuses are diminutive but patent. Both IJ bulbs are patent. Mildly prominent right jugular foramen pars nervosa incidentally noted.  2. Negative arterial findings on CTA head and neck aside from vessel tortuosity. Retropharyngeal course of both carotid arteries. 3. Stable appearance of posterior right hemisphere intra-axial hemorrhage since 10/06/2015. Mild surrounding edema with no significant mass effect. No associated cortical vein thrombosis is identified. No evidence of associated AVM. 4. Cardiomegaly re-demonstrated. Central pulmonary artery enlargement may indicate a degree of  pulmonary artery hypertension. Increased pulmonary septal thickening in Mary lung apices may indicate mild interstitial edema.  Mr Brain Wo Contrast 10/10/2015 IMPRESSION: 1. Stable size and appearance of posterior right temporal intraparenchymal hemorrhage with mild localized edema without significant mass effect. 2. Few additional scattered foci susceptibility artifact within Mary supratentorial and infratentorial brain, most consistent with small chronic micro hemorrhages. Underlying hypertensive etiology is favored. 3. Moderate chronic microvascular ischemic disease. 4. Empty sella  Ct Angio Chest Pe W/cm &/or Wo Cm 10/06/2015  No CT evidence of pulmonary embolism. Cardiomegaly with mild edema or congestive changes. No focal consolidation.   Carotid Ultrasound 10/07/2015 No significant extracranial carotid artery stenosis demonstrated. Right vertebral artery was not clearly visualized. Left vertebral artery demonstrated antegrade flow.  LTM EEG Diffuse slowing - background slowing and diffuse rhythmic delta activity. No electrographic seizures were seen.  TTE  - Left ventricle: Mary cavity size was normal. There was moderate concentric hypertrophy. Systolic function was normal. Mary estimated ejection fraction was in Mary range of 60% to 65%. Wall motion was normal; there were no regional wall motion abnormalities. Features are consistent with a pseudonormal left ventricular filling pattern, with concomitant abnormal relaxation and increased filling pressure (grade 2 diastolic dysfunction). Doppler parameters are consistent with elevated ventricular end-diastolic filling pressure. - Aortic valve: Trileaflet; mildly thickened, mildly calcified leaflets. There was no regurgitation. - Aortic root: Mary aortic root was normal in size. -  Mitral valve: Structurally normal valve. There was trivial regurgitation. - Left atrium: Mary atrium was normal in size. - Right ventricle: Mary cavity size was  normal. Wall thickness was normal. Systolic function was normal. - Right atrium: Mary atrium was normal in size. - Tricuspid valve: There was trivial regurgitation. - Pulmonic valve: There was no regurgitation. - Pulmonary arteries: Systolic pressure was within Mary normal range. - Inferior vena cava: Mary vessel was dilated. Mary respirophasic diameter changes were blunted (< 50%), consistent with elevated central venous pressure. - Pericardium, extracardiac: There was no pericardial effusion.  LE venous doppler  No evidence of DVT, superficial thrombosis, or Baker's Cyst.  MRI brain w/wo contrast 11/2015 1. Chronic right posterior temporal intracerebral hemorrhage with residual hemosiderin deposition and gliosis.  2. Mild-moderate periventricular and subcortical chronic small vessel ischemic disease.  3. Few punctate left hemisphere chronic cerebral microhemorrhages. 4. No acute findings. 5. Compared to MRI on 10/10/15, there has been expected reduction in size and signal of Mary chronic right temporal intracerebral hemorrhage. No abnormal enhancement or expansion of lesion to suggest underlying vascular malformation or mass.  6. Note that [name] had minor reaction to gadolinium contrast (Numbness and itching without hives. See above.)  MRA head 11/2015 Normal MRA head.  Component     Latest Ref Rng & Units 10/07/2015 10/08/2015  Cholesterol     0 - 200 mg/dL  112  Triglycerides     <150 mg/dL  62  HDL Cholesterol     >40 mg/dL  42  Total CHOL/HDL Ratio     RATIO  2.7  VLDL     0 - 40 mg/dL  12  LDL (calc)     0 - 99 mg/dL  58  Hemoglobin A1C     4.8 - 5.6 % 5.7 (H)   Mean Plasma Glucose     mg/dL 117     Assessment: As [name] may recall, [name] is a 56 y.o. African American female with PMH of morbid obesity and hypertension admitted on 10/06/15 for LOC and GTC seizure. CT showed right temporal small ICH, and repeat CT stable hematoma. CTA and CTV head without AVM or CVT. CUS, DVT  screening and TTE unremarkable. LTM  EEG no seizure. LD 58 and A1C 5.7. [name] was put on keppra 750mg  bid. [name] was discharged to CIR with planning outpt sleep study. During Mary interval time, [name] has no seizure activity. Complains of right shoulder pain with limited ROM at right UE. Will refer to Dr. Letta Pate. Repeat MRI and MRA showed resolved bleeding and no underlying vascular abnormalities. Will put on baby ASA. Sleep study on hold for evaluation of OSA due to insurance at this time. Seizure free for 6 months now and able to drive. Continue follow up with [name] for shoulder pain.   Plan:  - will initiate ASA 81mg  daily for stroke prevention - follow up with Dr. Lottie Rater for rehab and shoulder pain - working on [employer], once approved, will do sleep study for sleep apnea evaluation.  - Follow up with your primary care physician for stroke risk factor modification. Recommend maintain blood pressure goal <130/80, diabetes with hemoglobin A1c goal below 7.0% and lipids with LDL cholesterol goal below 70 mg/dL.  - check BP at home and record - continue outpt waterobic therapy   - continue Mary keppra for seizure control - [name] are seizure free for 6 months now, and [name] are able to drive now. However, if seizure recurs,  [name] can not drive again for 6 months.   - Please maintain seizure precautions.  - follow up in 6 months  I spent more than 25 minutes of face to face time with [name]. Greater than 50% of time was spent in counseling and coordination of care. We discussed driving privilege, sleep study for OSA, and seizure precautions.    No orders of Mary defined types were placed in this encounter.   Meds ordered this encounter  Medications  . POLY-IRON 150 150 MG capsule    Refill:  0  . aspirin EC 81 MG tablet    Sig: Take 1 tablet (81 mg total) by mouth daily.    [name] Instructions  - will initiate ASA 81mg  daily for stroke prevention - follow up with Dr. Lottie Rater for  rehab and shoulder pain - working on [employer], once approved, will do sleep study for sleep apnea evaluation.  - Follow up with your primary care physician for stroke risk factor modification. Recommend maintain blood pressure goal <130/80, diabetes with hemoglobin A1c goal below 7.0% and lipids with LDL cholesterol goal below 70 mg/dL.  - check BP at home and record - continue outpt waterobic therapy   - continue Mary keppra for seizure control - [name] are seizure free for 6 months now, and [name] are able to drive now. However, if seizure recurs, [name] can not drive again for 6 months.   - Please maintain seizure precautions.  - follow up in 6 months   Rosalin Hawking, MD PhD [facility] [address], [place], [place] [address] [phone]

## 2016-04-08 ENCOUNTER — Ambulatory Visit: Payer: Self-pay

## 2016-04-22 ENCOUNTER — Encounter: Payer: Self-pay | Admitting: Internal Medicine

## 2016-04-22 ENCOUNTER — Ambulatory Visit: Payer: Medicaid Other | Attending: Internal Medicine | Admitting: Internal Medicine

## 2016-04-22 VITALS — BP 116/79 | HR 81 | Temp 98.3°F | Resp 16 | Wt 271.8 lb

## 2016-04-22 DIAGNOSIS — Z8673 Personal history of transient ischemic attack (TIA), and cerebral infarction without residual deficits: Secondary | ICD-10-CM | POA: Diagnosis not present

## 2016-04-22 DIAGNOSIS — G4733 Obstructive sleep apnea (adult) (pediatric): Secondary | ICD-10-CM | POA: Diagnosis not present

## 2016-04-22 DIAGNOSIS — M79601 Pain in right arm: Secondary | ICD-10-CM | POA: Insufficient documentation

## 2016-04-22 DIAGNOSIS — F39 Unspecified mood [affective] disorder: Secondary | ICD-10-CM | POA: Insufficient documentation

## 2016-04-22 DIAGNOSIS — E669 Obesity, unspecified: Secondary | ICD-10-CM

## 2016-04-22 DIAGNOSIS — Z7982 Long term (current) use of aspirin: Secondary | ICD-10-CM | POA: Diagnosis not present

## 2016-04-22 DIAGNOSIS — Z7984 Long term (current) use of oral hypoglycemic drugs: Secondary | ICD-10-CM | POA: Diagnosis not present

## 2016-04-22 DIAGNOSIS — R7303 Prediabetes: Secondary | ICD-10-CM

## 2016-04-22 DIAGNOSIS — I10 Essential (primary) hypertension: Secondary | ICD-10-CM | POA: Diagnosis present

## 2016-04-22 DIAGNOSIS — Z79899 Other long term (current) drug therapy: Secondary | ICD-10-CM | POA: Insufficient documentation

## 2016-04-22 DIAGNOSIS — I611 Nontraumatic intracerebral hemorrhage in hemisphere, cortical: Secondary | ICD-10-CM

## 2016-04-22 LAB — GLUCOSE, POCT (MANUAL RESULT ENTRY): POC Glucose: 121 mg/dl — AB (ref 70–99)

## 2016-04-22 MED ORDER — METOPROLOL TARTRATE 50 MG PO TABS: 50.0000 mg | ORAL_TABLET | Freq: Two times a day (BID) | ORAL | 3 refills | Status: DC

## 2016-04-22 MED ORDER — DICLOFENAC SODIUM 1 % TD GEL: 2.0000 g | Freq: Four times a day (QID) | TRANSDERMAL | 3 refills | Status: DC

## 2016-04-22 MED ORDER — FERROUS SULFATE 325 (65 FE) MG PO TABS: 325.0000 mg | ORAL_TABLET | Freq: Two times a day (BID) | ORAL | 3 refills | Status: DC

## 2016-04-22 MED ORDER — AMLODIPINE BESYLATE 10 MG PO TABS: 10.0000 mg | ORAL_TABLET | Freq: Every day | ORAL | 3 refills | Status: DC

## 2016-04-22 MED ORDER — CHLORTHALIDONE 25 MG PO TABS
25.0000 mg | ORAL_TABLET | Freq: Every day | ORAL | 3 refills | Status: DC
Start: 2016-04-22 — End: 2017-04-14

## 2016-04-22 MED ORDER — ASPIRIN EC 81 MG PO TBEC: 81.0000 mg | DELAYED_RELEASE_TABLET | Freq: Every day | ORAL | Status: DC

## 2016-04-22 MED ORDER — METFORMIN HCL ER 500 MG PO TB24: 500.0000 mg | ORAL_TABLET | Freq: Every day | ORAL | 3 refills | Status: DC

## 2016-04-22 MED FILL — ?AMLODIPINE BESYLATE 10 MG: 10 | 90 days supply | Qty: 90 | Fill #0

## 2016-04-22 MED FILL — ?CHLORTHALIDONE 25 MG TABLE: 25 | 90 days supply | Qty: 90 | Fill #0

## 2016-04-22 MED FILL — METFORMIN HCL ER 500 MG TAB: 500 | 30 days supply | Qty: 30 | Fill #0

## 2016-04-22 MED FILL — FERROUS SULFATE 325 MG TAB: 325 (65 FE) | 30 days supply | Qty: 60 | Fill #0

## 2016-04-22 MED FILL — DICLOFENAC SODIUM 1% GEL: 1 | 13 days supply | Qty: 100 | Fill #0

## 2016-04-22 MED FILL — METOPROLOL TARTRATE 50 MG T: 50 | 30 days supply | Qty: 60 | Fill #0

## 2016-04-22 NOTE — Patient Instructions (Signed)
Low-Sodium Eating Plan Sodium raises blood pressure and causes water to be held in the body. Getting less sodium from food will help lower your blood pressure, reduce any swelling, and protect your heart, liver, and kidneys. We get sodium by adding salt (sodium chloride) to food. Most of our sodium comes from canned, boxed, and frozen foods. Restaurant foods, fast foods, and pizza are also very high in sodium. Even if you take medicine to lower your blood pressure or to reduce fluid in your body, getting less sodium from your food is important. What is my plan? Most people should limit their sodium intake to 2,300 mg a day. Your health care provider recommends that you limit your sodium intake to '000mg'$  a day. What do I need to know about this eating plan? For the low-sodium eating plan, you will follow these general guidelines:  Choose foods with a % Daily Value for sodium of less than 5% (as listed on the food label).  Use salt-free seasonings or herbs instead of table salt or sea salt.  Check with your health care provider or pharmacist before using salt substitutes.  Eat fresh foods.  Eat more vegetables and fruits.  Limit canned vegetables. If you do use them, rinse them well to decrease the sodium.  Limit cheese to 1 oz (28 g) per day.  Eat lower-sodium products, often labeled as "lower sodium" or "no salt added."  Avoid foods that contain monosodium glutamate (MSG). MSG is sometimes added to [place] food and some canned foods.  Check food labels (Nutrition Facts labels) on foods to learn how much sodium is in one serving.  Eat more home-cooked food and less restaurant, buffet, and fast food.  When eating at a restaurant, ask that your food be prepared with less salt, or no salt if possible. How do I read food labels for sodium information? The Nutrition Facts label lists the amount of sodium in one serving of the food. If you eat more than one serving, you must multiply the  listed amount of sodium by the number of servings. Food labels may also identify foods as:  Sodium free-Less than 5 mg in a serving.  Very low sodium-35 mg or less in a serving.  Low sodium-140 mg or less in a serving.  Light in sodium-50% less sodium in a serving. For example, if a food that usually has 300 mg of sodium is changed to become light in sodium, it will have 150 mg of sodium.  Reduced sodium-25% less sodium in a serving. For example, if a food that usually has 400 mg of sodium is changed to reduced sodium, it will have 300 mg of sodium. What foods can I eat? Grains  Low-sodium cereals, including oats, puffed wheat and rice, and shredded wheat cereals. Low-sodium crackers. Unsalted rice and pasta. Lower-sodium bread. Vegetables  Frozen or fresh vegetables. Low-sodium or reduced-sodium canned vegetables. Low-sodium or reduced-sodium tomato sauce and paste. Low-sodium or reduced-sodium tomato and vegetable juices. Fruits  Fresh, frozen, and canned fruit. Fruit juice. Meat and Other Protein Products  Low-sodium canned tuna and salmon. Fresh or frozen meat, poultry, seafood, and fish. Lamb. Unsalted nuts. Dried beans, peas, and lentils without added salt. Unsalted canned beans. Homemade soups without salt. Eggs. Dairy  Milk. Soy milk. Ricotta cheese. Low-sodium or reduced-sodium cheeses. Yogurt. Condiments  Fresh and dried herbs and spices. Salt-free seasonings. Onion and garlic powders. Low-sodium varieties of mustard and ketchup. Fresh or refrigerated horseradish. Lemon juice. Fats and Oils  Reduced-sodium  salad dressings. Unsalted butter. Other  Unsalted popcorn and pretzels. The items listed above may not be a complete list of recommended foods or beverages. Contact your dietitian for more options.  What foods are not recommended? Grains  Instant hot cereals. Bread stuffing, pancake, and biscuit mixes. Croutons. Seasoned rice or pasta mixes. Noodle soup cups. Boxed or  frozen macaroni and cheese. Self-rising flour. Regular salted crackers. Vegetables  Regular canned vegetables. Regular canned tomato sauce and paste. Regular tomato and vegetable juices. Frozen vegetables in sauces. Salted [place] fries. Olives. Angie Fava. Relishes. Sauerkraut. Salsa. Meat and Other Protein Products  Salted, canned, smoked, spiced, or pickled meats, seafood, or fish. Bacon, ham, sausage, hot dogs, corned beef, chipped beef, and packaged luncheon meats. Salt pork. Jerky. Pickled herring. Anchovies, regular canned tuna, and sardines. Salted nuts. Dairy  Processed cheese and cheese spreads. Cheese curds. Blue cheese and cottage cheese. Buttermilk. Condiments  Onion and garlic salt, seasoned salt, table salt, and sea salt. Canned and packaged gravies. Worcestershire sauce. Tartar sauce. Barbecue sauce. Teriyaki sauce. Soy sauce, including reduced sodium. Steak sauce. Fish sauce. Oyster sauce. Cocktail sauce. Horseradish that you find on the shelf. Regular ketchup and mustard. Meat flavorings and tenderizers. Bouillon cubes. Hot sauce. Tabasco sauce. Marinades. Taco seasonings. Relishes. Fats and Oils  Regular salad dressings. Salted butter. Margarine. Ghee. Bacon fat. Other  Potato and tortilla chips. Corn chips and puffs. Salted popcorn and pretzels. Canned or dried soups. Pizza. Frozen entrees and pot pies. The items listed above may not be a complete list of foods and beverages to avoid. Contact your dietitian for more information.  This information is not intended to replace advice given to you by your health care provider. Make sure you discuss any questions you have with your health care provider. Document Released: 08/31/2001 Document Revised: 08/17/2015 Document Reviewed: 01/13/2013 Elsevier Interactive Patient Education  2017 Elsevier Inc[employer]  - Diabetes Mellitus and Food It is important for you to manage your blood sugar (glucose) level. Your blood glucose level can be greatly  affected by what you eat. Eating healthier foods in the appropriate amounts throughout the day at about the same time each day will help you control your blood glucose level. It can also help slow or prevent worsening of your diabetes mellitus. Healthy eating may even help you improve the level of your blood pressure and reach or maintain a healthy weight. General recommendations for healthful eating and cooking habits include:  Eating meals and snacks regularly. Avoid going long periods of time without eating to lose weight.  Eating a diet that consists mainly of plant-based foods, such as fruits, vegetables, nuts, legumes, and whole grains.  Using low-heat cooking methods, such as baking, instead of high-heat cooking methods, such as deep frying. Work with your dietitian to make sure you understand how to use the Nutrition Facts information on food labels. How can food affect me? Carbohydrates  Carbohydrates affect your blood glucose level more than any other type of food. Your dietitian will help you determine how many carbohydrates to eat at each meal and teach you how to count carbohydrates. Counting carbohydrates is important to keep your blood glucose at a healthy level, especially if you are using insulin or taking certain medicines for diabetes mellitus. Alcohol  Alcohol can cause sudden decreases in blood glucose (hypoglycemia), especially if you use insulin or take certain medicines for diabetes mellitus. Hypoglycemia can be a life-threatening condition. Symptoms of hypoglycemia (sleepiness, dizziness, and disorientation) are similar to symptoms of  having too much alcohol. If your health care provider has given you approval to drink alcohol, do so in moderation and use the following guidelines:  Women should not have more than one drink per day, and men should not have more than two drinks per day. One drink is equal to:  12 oz of beer.  5 oz of wine.  1 oz of hard liquor.  Do not  drink on an empty stomach.  Keep yourself hydrated. Have water, diet soda, or unsweetened iced tea.  Regular soda, juice, and other mixers might contain a lot of carbohydrates and should be counted. What foods are not recommended? As you make food choices, it is important to remember that all foods are not the same. Some foods have fewer nutrients per serving than other foods, even though they might have the same number of calories or carbohydrates. It is difficult to get your body what it needs when you eat foods with fewer nutrients. Examples of foods that you should avoid that are high in calories and carbohydrates but low in nutrients include:  Trans fats (most processed foods list trans fats on the Nutrition Facts label).  Regular soda.  Juice.  Candy.  Sweets, such as cake, pie, doughnuts, and cookies.  Fried foods. What foods can I eat? Eat nutrient-rich foods, which will nourish your body and keep you healthy. The food you should eat also will depend on several factors, including:  The calories you need.  The medicines you take.  Your weight.  Your blood glucose level.  Your blood pressure level.  Your cholesterol level. You should eat a variety of foods, including:  Protein.  Lean cuts of meat.  Proteins low in saturated fats, such as fish, egg whites, and beans. Avoid processed meats.  Fruits and vegetables.  Fruits and vegetables that may help control blood glucose levels, such as apples, mangoes, and yams.  Dairy products.  Choose fat-free or low-fat dairy products, such as milk, yogurt, and cheese.  Grains, bread, pasta, and rice.  Choose whole grain products, such as multigrain bread, whole oats, and brown rice. These foods may help control blood pressure.  Fats.  Foods containing healthful fats, such as nuts, avocado, olive oil, canola oil, and fish. Does everyone with diabetes mellitus have the same meal plan? Because every person with diabetes  mellitus is different, there is not one meal plan that works for everyone. It is very important that you meet with a dietitian who will help you create a meal plan that is just right for you. This information is not intended to replace advice given to you by your health care provider. Make sure you discuss any questions you have with your health care provider. Document Released: 12/06/2004 Document Revised: 08/17/2015 Document Reviewed: 02/05/2013 Elsevier Interactive Patient Education  2017 [employer] Introduction Exercising can help you to lose weight. In order to lose weight through exercise, you need to do vigorous-intensity exercise. You can tell that you are exercising with vigorous intensity if you are breathing very hard and fast and cannot hold a conversation while exercising. Moderate-intensity exercise helps to maintain your current weight. You can tell that you are exercising at a moderate level if you have a higher heart rate and faster breathing, but you are still able to hold a conversation. How often should I exercise? Choose an activity that you enjoy and set realistic goals. Your health care provider can help you to  make an activity plan that works for you. Exercise regularly as directed by your health care provider. This may include:  Doing resistance training twice each week, such as:  Push-ups.  Sit-ups.  Lifting weights.  Using resistance bands.  Doing a given intensity of exercise for a given amount of time. Choose from these options:  150 minutes of moderate-intensity exercise every week.  75 minutes of vigorous-intensity exercise every week.  A mix of moderate-intensity and vigorous-intensity exercise every week. Children, pregnant women, people who are out of shape, people who are overweight, and older adults may need to consult a health care provider for individual recommendations. If you have any sort of medical condition,  be sure to consult your health care provider before starting a new exercise program. What are some activities that can help me to lose weight?  Walking at a rate of at least 4.5 miles an hour.  Jogging or running at a rate of 5 miles per hour.  Biking at a rate of at least 10 miles per hour.  Lap swimming.  Roller-skating or in-line skating.  Cross-country skiing.  Vigorous competitive sports, such as football, basketball, and soccer.  Jumping rope.  Aerobic dancing. How can I be more active in my day-to-day activities?  Use the stairs instead of the elevator.  Take a walk during your lunch break.  If you drive, park your car farther away from work or school.  If you take public transportation, get off one stop early and walk the rest of the way.  Make all of your phone calls while standing up and walking around.  Get up, stretch, and walk around every 30 minutes throughout the day. What guidelines should I follow while exercising?  Do not exercise so much that you hurt yourself, feel dizzy, or get very short of breath.  Consult your health care provider prior to starting a new exercise program.  Wear comfortable clothes and shoes with good support.  Drink plenty of water while you exercise to prevent dehydration or heat stroke. Body water is lost during exercise and must be replaced.  Work out until you breathe faster and your heart beats faster. This information is not intended to replace advice given to you by your health care provider. Make sure you discuss any questions you have with your health care provider. Document Released: 04/13/2010 Document Revised: 08/17/2015 Document Reviewed: 08/12/2013  2017 Elsevier

## 2016-04-22 NOTE — Progress Notes (Signed)
[name], is a 56 y.o. female  [id number]  [id number]  DOB - [birth year]  Chief Complaint  Patient presents with  . Hypertension  . Arm Pain    Right        Subjective:   [name] is a 55 y.o. female here today for a follow up visit for htn. Last seen 02/20/16 for htn, morbid obesity, osa, hx of Right temp ICH 11/07/15, and prediabetes. Since than, she is taking all meds as prescribed, has difficult time w/ salt intake since likes her potato chips.  Her right shoulder pains has improved w/ volterin gel.  She does mention problems of feeling sad and has more mood swings lately.  Denies si/hi/avh, but will spontaneously start crying. She is not interested in talking to sw today (offered) or starting an antidepressant at this time. She does not want to start any more pills.  Her bp has been good at home, sbp 120s,. Was running bit this am. tol metformin and her other pills w/o problems.  Here w/ her husband.  Still working on financial aid  Patient has No headache, No chest pain, No abdominal pain - No Nausea, No new weakness tingling or numbness, No Cough - SOB.  No problems updated.  ALLERGIES: Allergies  Allergen Reactions  . Eggs Or Egg-Derived Products   . Gadolinium Derivatives Itching    Immediately after gad injection pt. complained of tongue numbness and lower lip/ then had rt side of body tingling/ pt. Was assessed by Dr. Jeralyn Ruths before leaving/ no medications were administered/    PAST MEDICAL HISTORY: Past Medical History:  Diagnosis Date  . Headache   . Hypertension   . Iron deficiency anemia   . Seizures ([place])   . Stroke [facility])     MEDICATIONS AT HOME: Prior to Admission medications   Medication Sig Start Date End Date Taking? Authorizing Provider  amLODipine (NORVASC) 10 MG tablet Take 1 tablet (10 mg total) by mouth daily. 02/20/16   Maren Reamer, MD  aspirin EC 81 MG tablet Take 1 tablet (81 mg total) by mouth daily. 04/03/16    Rosalin Hawking, MD  chlorthalidone (HYGROTON) 25 MG tablet Take 1 tablet (25 mg total) by mouth daily. 02/20/16   Maren Reamer, MD  diclofenac sodium (VOLTAREN) 1 % GEL Apply 2 g topically 4 (four) times daily. 02/02/16   Ankit Lorie Phenix, MD  ferrous sulfate (FERROUSUL) 325 (65 FE) MG tablet Take 1 tablet (325 mg total) by mouth 2 (two) times daily with a meal. 03/04/16   Maren Reamer, MD  fluticasone (FLONASE) 50 MCG/ACT nasal spray Place 1 spray into both nostrils daily as needed for allergies or rhinitis.    Historical Provider, MD  hydroxypropyl methylcellulose / hypromellose (ISOPTO TEARS / GONIOVISC) 2.5 % ophthalmic solution Place 2 drops into both eyes as needed (irritation). 10/20/15   Bary Leriche, PA-C  levETIRAcetam (KEPPRA) 750 MG tablet Take 1 tablet (750 mg total) by mouth 2 (two) times daily. 02/20/16   Maren Reamer, MD  metFORMIN (GLUCOPHAGE XR) 500 MG 24 hr tablet Take 1 tablet (500 mg total) by mouth daily with breakfast. 02/20/16   Maren Reamer, MD  metoprolol (LOPRESSOR) 50 MG tablet Take 1 tablet (50 mg total) by mouth 2 (two) times daily. 02/20/16   Maren Reamer, MD  POLY-IRON 150 150 MG capsule  02/21/16   Historical Provider, MD     Objective:   Vitals:   04/22/16  1119  BP: (!) 149/81  Pulse: 81  Resp: 16  Temp: 98.3 F (36.8 C)  TempSrc: Oral  SpO2: 95%  Weight: 271 lb 12.8 oz (123.3 kg)   Repeat bp 116/79 today  Exam General appearance : Awake, alert, not in any distress. Speech Clear. Not toxic looking, morbid obese HEENT: Atraumatic and Normocephalic, pupils equally reactive to light. Neck: supple, no JVD.  Chest:Good air entry bilaterally, no added sounds. CVS: S1 S2 regular, no murmurs/gallups or rubs. Abdomen: Bowel sounds active,  Obese, Non tender and not distended with no gaurding, rigidity or rebound. Extremities: B/L Lower Ext shows no edema, both legs are warm to touch ROM of right shoulder limited due to pains/arthralgias,  but not bad per pt. Neurology: Awake alert, and oriented X 3, CN II-XII grossly intact, Non focal Skin:No Rash  Data Review Lab Results  Component Value Date   HGBA1C 5.8 02/20/2016   HGBA1C 5.7 (H) 10/07/2015    Depression screen PHQ 2/9 04/22/2016 02/20/2016 11/07/2015 11/02/2015 10/23/2015  Decreased Interest 1 1 (No Data) 3 3  Down, Depressed, Hopeless 2 1 (No Data) 1 3  PHQ - 2 Score 3 2 - 4 6  Altered sleeping 2 2 2 3 3   Tired, decreased energy 1 1 3 3 3   Change in appetite 1 2 2 1 3   Feeling bad or failure about yourself  2 1 0 0 3  Trouble concentrating 2 0 2 3 3   Moving slowly or fidgety/restless 0 0 0 3 3  Suicidal thoughts 0 0 0 0 3  PHQ-9 Score 11 8 - 17 27  Difficult doing work/chores - - - Extremely dIfficult -      Assessment & Plan   1. Benign essential HTN Repeat bp good, continue same rx, norvasc 10qd, chlorathalidone 25 qd, metoprolol 50bid - low salt diet encouraged, info provided  2. Obstructive sleep apnea, suspected - but still has not been able to get sleep study due to no financial assistance.   3. Prediabetes Continue metoformin 500 qd  4. Super obese ([place]) Increase exercise recd to lose weight, watching carbs/salt intake import as well.  5. hx of Right temp ICH 11/07/15 - last saw Neuro 04/04/15, on asa 81 for stroke prevention  6. Right shoulder ain, Continue w/ rehab w/ Dr Posey Pronto, continue PT, volterin gel rx renewed since seems to be working  7. 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 - she denies si/hi/avh.  8. Still needs MM and colonoscopy screening, pending financial aid assistance.   Patient have been counseled extensively about nutrition and exercise  Return in about 3 months (around 07/21/2016), or if symptoms worsen or fail to improve.  The patient was given clear instructions to go to ER or return to medical center if symptoms don't improve,  worsen or new problems develop. The patient verbalized understanding. The patient was told to call to get lab results if they haven't heard anything in the next week.   This note has been created with Surveyor, quantity. Any transcriptional errors are unintentional.   Maren Reamer, MD, [place] and [facility] [place], [place]   04/22/2016, 11:23 AM

## 2016-04-23 ENCOUNTER — Ambulatory Visit: Payer: Self-pay | Attending: Internal Medicine

## 2016-04-24 MED FILL — levETIRAcetam 750 MG TABS: 750 | 30 days supply | Qty: 60 | Fill #2

## 2016-05-02 ENCOUNTER — Encounter: Payer: Medicaid Other | Attending: Physical Medicine & Rehabilitation | Admitting: Physical Medicine & Rehabilitation

## 2016-05-02 VITALS — BP 106/71 | HR 77

## 2016-05-02 DIAGNOSIS — F329 Major depressive disorder, single episode, unspecified: Secondary | ICD-10-CM | POA: Insufficient documentation

## 2016-05-02 DIAGNOSIS — Z5189 Encounter for other specified aftercare: Secondary | ICD-10-CM | POA: Diagnosis present

## 2016-05-02 DIAGNOSIS — M62838 Other muscle spasm: Secondary | ICD-10-CM | POA: Insufficient documentation

## 2016-05-02 DIAGNOSIS — I10 Essential (primary) hypertension: Secondary | ICD-10-CM | POA: Diagnosis not present

## 2016-05-02 DIAGNOSIS — I69249 Monoplegia of lower limb following other nontraumatic intracranial hemorrhage affecting unspecified side: Secondary | ICD-10-CM | POA: Insufficient documentation

## 2016-05-02 DIAGNOSIS — R7303 Prediabetes: Secondary | ICD-10-CM | POA: Diagnosis not present

## 2016-05-02 DIAGNOSIS — I69398 Other sequelae of cerebral infarction: Secondary | ICD-10-CM

## 2016-05-02 DIAGNOSIS — I629 Nontraumatic intracranial hemorrhage, unspecified: Secondary | ICD-10-CM | POA: Insufficient documentation

## 2016-05-02 DIAGNOSIS — I611 Nontraumatic intracerebral hemorrhage in hemisphere, cortical: Secondary | ICD-10-CM

## 2016-05-02 DIAGNOSIS — R269 Unspecified abnormalities of gait and mobility: Secondary | ICD-10-CM | POA: Diagnosis not present

## 2016-05-02 DIAGNOSIS — R569 Unspecified convulsions: Secondary | ICD-10-CM | POA: Diagnosis not present

## 2016-05-02 DIAGNOSIS — M7541 Impingement syndrome of right shoulder: Secondary | ICD-10-CM

## 2016-05-02 DIAGNOSIS — M25511 Pain in right shoulder: Secondary | ICD-10-CM

## 2016-05-02 DIAGNOSIS — G8929 Other chronic pain: Secondary | ICD-10-CM

## 2016-05-02 MED ORDER — GABAPENTIN 300 MG PO CAPS
300.0000 mg | ORAL_CAPSULE | Freq: Three times a day (TID) | ORAL | 1 refills | Status: DC
Start: 2016-05-02 — End: 2016-06-13

## 2016-05-02 NOTE — Progress Notes (Deleted)
   Subjective:    Patient ID: [name], female    DOB: November 10, 1960, 56 y.o.   MRN: [id number]  HPI   Pain Inventory Average Pain 7 Pain Right Now 6 My pain is sharp, dull, stabbing and aching  In the last 24 hours, has pain interfered with the following? General activity 5 Relation with others 4 Enjoyment of life 6 What TIME of day is your pain at its worst? night Sleep (in general) Poor  Pain is worse with: some activites and . Pain improves with: heat/ice, pacing activities, medication and pressure Relief from Meds: 5  Mobility walk with assistance use a cane ability to climb steps?  yes do you drive?  no transfers alone  Function not employed: date last employed . I need assistance with the following:  household duties  Neuro/Psych weakness spasms dizziness  Prior Studies Any changes since last visit?  no  Physicians involved in your care Any changes since last visit?  no   Family History  Problem Relation Age of Onset  . Diabetes Father   . Hypertension Father   . Hypertension Sister   . Stroke Brother    Social History   Social History  . Marital status: Married    Spouse name: N/A  . Number of children: N/A  . Years of education: N/A   Social History Main Topics  . Smoking status: Never Smoker  . Smokeless tobacco: Never Used  . Alcohol use No  . Drug use: No  . Sexual activity: Not on file   Other Topics Concern  . Not on file   Social History Narrative  . No narrative on file   Past Surgical History:  Procedure Laterality Date  . CARDIAC CATHETERIZATION  04/2010   Dr. Terrence Dupont   Past Medical History:  Diagnosis Date  . Headache   . Hypertension   . Iron deficiency anemia   . Seizures ([place])   . Stroke ([place])    LMP 02/17/2011   Opioid Risk Score:   Fall Risk Score:  `1  Depression screen PHQ 2/9  Depression screen [facility] 2/9 04/22/2016 02/20/2016 11/07/2015 11/02/2015 10/23/2015  Decreased Interest 1 1 (No Data) 3 3    Down, Depressed, Hopeless 2 1 (No Data) 1 3  PHQ - 2 Score 3 2 - 4 6  Altered sleeping 2 2 2 3 3   Tired, decreased energy 1 1 3 3 3   Change in appetite 1 2 2 1 3   Feeling bad or failure about yourself  2 1 0 0 3  Trouble concentrating 2 0 2 3 3   Moving slowly or fidgety/restless 0 0 0 3 3  Suicidal thoughts 0 0 0 0 3  PHQ-9 Score 11 8 - 17 27  Difficult doing work/chores - - - Extremely dIfficult -   Review of Systems  Constitutional: Negative.   HENT: Negative.   Eyes: Negative.   Respiratory: Negative.   Cardiovascular: Negative.   Gastrointestinal: Negative.   Endocrine: Negative.   Genitourinary: Negative.   Musculoskeletal: Positive for gait problem.  Skin: Negative.   Allergic/Immunologic: Negative.   Hematological: Negative.   Psychiatric/Behavioral: Negative.   All other systems reviewed and are negative.      Objective:   Physical Exam        Assessment & Plan:

## 2016-05-02 NOTE — Progress Notes (Signed)
Subjective:    Patient ID: [name], female    DOB: [birth year], 56 y.o.   MRN: [id number]  HPI  56 year old RH-female with history of morbid obesity, HTN, iron deficiency anemia who presents for follow up after right temporal intracranial hemorrhage.  Last clinic visit 02/02/16.  Since last visit, she denies falls, seizures.  She saw Neurology, ASA started.  She completed therapies and is now going to start aquatic therapy. She is still trying to address her depression without medications.  Her BP is well controlled and states she is eating well.  Muscle spasms have improved.  She continues to use a cane at all times. She complains of numbness in her right side.  She also states she has been gaiting weight. She had a right shoulder xray which showed degenerative changes. The voltaren gel is helping.   Pain Inventory Average Pain 5 Pain Right Now 8 My pain is sharp, dull, stabbing, tingling and aching  In the last 24 hours, has pain interfered with the following? General activity 7 Relation with others 5 Enjoyment of life 7 What TIME of day is your pain at its worst? morning, night Sleep (in general) Fair  Pain is worse with: inactivity and some activites Pain improves with: heat/ice and medication Relief from Meds: 0  Mobility walk with assistance use a cane ability to climb steps?  yes do you drive?  no transfers alone Do you have any goals in this area?  yes  Function not employed: date last employed 10-06-15 I need assistance with the following:  meal prep, household duties and shopping Do you have any goals in this area?  yes  Neuro/Psych weakness spasms dizziness  Prior Studies Any changes since last visit?  yes  Physicians involved in your care na   Family History  Problem Relation Age of Onset  . Diabetes Father   . Hypertension Father   . Hypertension Sister   . Stroke Brother    Social History   Social History  . Marital status: Married   Spouse name: N/A  . Number of children: N/A  . Years of education: N/A   Social History Main Topics  . Smoking status: Never Smoker  . Smokeless tobacco: Never Used  . Alcohol use No  . Drug use: No  . Sexual activity: Not on file   Other Topics Concern  . Not on file   Social History Narrative  . No narrative on file   Past Surgical History:  Procedure Laterality Date  . CARDIAC CATHETERIZATION  04/2010   Dr. Terrence Dupont   Past Medical History:  Diagnosis Date  . Headache   . Hypertension   . Iron deficiency anemia   . Seizures ([place])   . Stroke ([place])    BP 106/71   Pulse 77   LMP 02/17/2011   SpO2 97%   Opioid Risk Score:   Fall Risk Score:  `1  Depression screen PHQ 2/9  Depression screen [facility] 2/9 04/22/2016 02/20/2016 11/07/2015 11/02/2015 10/23/2015  Decreased Interest 1 1 (No Data) 3 3  Down, Depressed, Hopeless 2 1 (No Data) 1 3  PHQ - 2 Score 3 2 - 4 6  Altered sleeping 2 2 2 3 3   Tired, decreased energy 1 1 3 3 3   Change in appetite 1 2 2 1 3   Feeling bad or failure about yourself  2 1 0 0 3  Trouble concentrating 2 0 2 3 3   Moving slowly or  fidgety/restless 0 0 0 3 3  Suicidal thoughts 0 0 0 0 3  PHQ-9 Score 11 8 - 17 27  Difficult doing work/chores - - - Extremely dIfficult -    Review of Systems  Constitutional:       Bladder control problems   HENT: Negative.   Eyes: Negative.   Respiratory: Negative.   Cardiovascular: Negative.   Gastrointestinal: Negative.   Endocrine: Negative.   Genitourinary: Negative.   Musculoskeletal: Positive for arthralgias and gait problem.  Skin: Negative.   Neurological: Positive for dizziness, seizures, weakness and numbness.       Tingling Spasms    Hematological: Negative.   Psychiatric/Behavioral: Negative.   All other systems reviewed and are negative.      Objective:   Physical Exam Constitutional: She appears well-developed and well-nourished. NAD. Morbidly obese.  HENT: Normocephalic and  atraumatic.  Eyes: EOMI. No discharge.  Cardiovascular: RRR. No JVD. Respiratory: Effort normal and breath sounds normal.  GI: Soft. Bowel sounds are normal.  Musculoskeletal:  No edema. TTP right shoulder. Pain with ROM right shoulder Neurological: She is alert and oriented.  Motor: RUE: Slightly limited due to pain, otherwise 5/5 throughout. Skin: Skin is warm and dry. Intact.  Psychiatric: She has a normal mood and affect.      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  1.  S/p right temporal intracranial hemorrhage  To start aquatic therapy  Cont meds  Will trial Gabapentin 300qhs   2. Depression   Pt states she is managing with her chaplain and scriptures  Still does not want medications at present, states she does better when she is able to go to church.  3. New onset seizures  Cont meds  4. Morbid obesity  Referral to dietitian  Cont to encourage weight loss  5. Muscle spasms  Pt refusing medications, but willing to try if necessary.  Encouraged ROM and stretching  6. Abnormality of gait - post stroke  Cont cane  Cont therapies  7. Right shoulder pain  Xrays reviewed with some degenerative change right shoulder  Cont Voltaren gel   Will consider steroid injection in future, she does not want at present

## 2016-05-20 MED FILL — METOPROLOL TARTRATE 50 MG T: 50 | 30 days supply | Qty: 60 | Fill #1

## 2016-05-20 MED FILL — METFORMIN HCL ER 500 MG TAB: 500 | 30 days supply | Qty: 30 | Fill #1

## 2016-05-20 MED FILL — FERROUS SULFATE 325 MG TAB: 325 (65 FE) | 30 days supply | Qty: 60 | Fill #1

## 2016-05-22 ENCOUNTER — Encounter: Payer: Self-pay | Admitting: Dietician

## 2016-05-22 ENCOUNTER — Encounter: Payer: Medicaid Other | Attending: Physical Medicine & Rehabilitation | Admitting: Dietician

## 2016-05-22 VITALS — Ht 59.0 in | Wt 270.4 lb

## 2016-05-22 DIAGNOSIS — Z6841 Body Mass Index (BMI) 40.0 and over, adult: Secondary | ICD-10-CM | POA: Diagnosis not present

## 2016-05-22 DIAGNOSIS — R7303 Prediabetes: Secondary | ICD-10-CM

## 2016-05-22 DIAGNOSIS — Z713 Dietary counseling and surveillance: Secondary | ICD-10-CM | POA: Diagnosis not present

## 2016-05-22 NOTE — Patient Instructions (Addendum)
-   Try to decrease intake of juices, sodas, sweetened tea, and other sugar- sweetened beverages. Start with 1-2 less drinks/day as a goal. Choose water or flavored water instead.  - Look for for Stevia / Truvia in the grocery store (non-caloric sweetener) to use in place or regular or brown sugar in oatmeal, recipes, koolaid, etc. Can also use Splenda  - Choose unsalted nuts, fruit, vegetables, low-fat cheese, yogurt, and whole gain crackers as snack options during the day or in the evening  - Try to eat at regular times throughout the day to prevent blood sugar from dropping too low. The same times each day is ideal.  - At meals and snacks, aim to include at least 2 food groups (food groups: fruit, vegetable, protein, carbohydrate, dairy)  - Overall, focusing on weight reduction and eating a healthier diet will help to reduce sugar (glucose), keep HgbA1c in check, and prevent diabetes! You're doing great!

## 2016-05-22 NOTE — Progress Notes (Signed)
Medical Nutrition Therapy:  Appt start time: 1030 end time:  1130.   Assessment:  Primary concerns today: prediabetes, obesity   Preferred Learning Style:  No preference indicated   Learning Readiness:  Ready   MEDICATIONS: Iron, Metformin, Gabapentin   DIETARY INTAKE: Usual eating pattern includes 2-3 meals and 1 snacks per day. Everyday foods include: popsicles.  Avoided foods include: pork, eggs, cheese unless cooked.    24-hr recall:  B ( AM): bacon & tater tots  Snk ( AM): none  L ( PM): usually a sandwich, [employer] example: will have 1/3 of foot-long sub or 1/2 of a 6" sub + Sunchips + drink Snk ( PM): none D ( PM): fried chicken, potato salad, turnip greens. mainly chicken as the meat, other vegetables, fish & hamburger meat occasionally Snk ( PM): popsicles, dark chocolate peanuts/ milk chocolate malted milk balls, frozen fruit Beverages: grapefruit juice, sweet/ unsweet tea, soda, water, seltzer water into juice sometimes, orange juice, fruit punch  Usual physical activity: None at this time. Patient is s/p CVA July 2017.  Estimated energy needs: 1752 calories 190 g carbohydrates 144 g protein 46 g fat  Progress Towards Goal(s):  Some progress.   Nutritional Diagnosis:  NB-1.1 Food and nutrition-related knowledge deficit As related to prediabetes and obesity.  As evidenced by limited knowledge on sugar and carbs and their impact on diet/ health. [place]-3.3 Overweight/obesity As related to lifestyle choices.  As evidenced by BMI of 54.6.    Intervention: Patient would like to discuss weight loss, preventing diabetes, relationship of sugar "glucose" to diabetes. Very limited knowledge of the link between dietary choices and impact on disease states. Family h/o diabetes. Patient is s/p CVA (July 2017) and is re-learning ADLs. Having a difficult time coping with current health state. Relays feeling of depression. RD reviewed most recent labs, GLU 121 (h) and explained this  result as it relates to diabetes/ preventing diabetes. Also explained the importance and meaning of HgbA1c lab value. Patient voiced understanding. RD also discussed preventing diabetes by way of diet modifications, weight reduction and physical activity. Explained the importance of eating at regular intervals throughout the day, choosing more whole-food options, and looking for ways to reduce caloric intake. Patient was unaware that sugar-sweetened beverages contained calories and had an impact on blood sugars. Goal was made to reduce amount of these beverages consumed daily. Consumes a high amount of fluids, mainly sugar-sweetened. Water consumption started only within the past 6 months. Does not eat much at any given time, with the exception of late-night snacking occasionally. Suggestion was made to focus on including more nutritious foods at snacks, especially if food intake has been low overall during the day. Using less sugar to sweeten products like oatmeal, or using a non-caloric sweetener instead was recommended. Patient and husband have switched from frying to baking most of the time which was encouraged. Eats pork very very rarely, husband does not eat it at all. She reports loving vegetables and fruits and eating them often which was also encouraged. Goals to follow.   Teaching Method Utilized: Auditory  Handouts given during visit include:  AVS with discussed goals   Barriers to learning/adherence to lifestyle change: physical limitations  Demonstrated degree of understanding via:  Teach Back   Monitoring/Evaluation:  Dietary intake, exercise, blood glucose/ HgbA1c, and body weight prn.

## 2016-05-27 MED FILL — levETIRAcetam 750 MG TABS: 750 | 30 days supply | Qty: 60 | Fill #2

## 2016-06-13 ENCOUNTER — Encounter: Payer: Medicaid Other | Attending: Physical Medicine & Rehabilitation | Admitting: Physical Medicine & Rehabilitation

## 2016-06-13 ENCOUNTER — Encounter: Payer: Self-pay | Admitting: Physical Medicine & Rehabilitation

## 2016-06-13 VITALS — BP 126/79 | HR 73

## 2016-06-13 DIAGNOSIS — M62838 Other muscle spasm: Secondary | ICD-10-CM | POA: Diagnosis not present

## 2016-06-13 DIAGNOSIS — R269 Unspecified abnormalities of gait and mobility: Secondary | ICD-10-CM

## 2016-06-13 DIAGNOSIS — I629 Nontraumatic intracranial hemorrhage, unspecified: Secondary | ICD-10-CM | POA: Diagnosis present

## 2016-06-13 DIAGNOSIS — I10 Essential (primary) hypertension: Secondary | ICD-10-CM | POA: Diagnosis not present

## 2016-06-13 DIAGNOSIS — G8929 Other chronic pain: Secondary | ICD-10-CM

## 2016-06-13 DIAGNOSIS — R7303 Prediabetes: Secondary | ICD-10-CM | POA: Insufficient documentation

## 2016-06-13 DIAGNOSIS — Z5189 Encounter for other specified aftercare: Secondary | ICD-10-CM | POA: Diagnosis present

## 2016-06-13 DIAGNOSIS — I69398 Other sequelae of cerebral infarction: Secondary | ICD-10-CM

## 2016-06-13 DIAGNOSIS — F329 Major depressive disorder, single episode, unspecified: Secondary | ICD-10-CM | POA: Insufficient documentation

## 2016-06-13 DIAGNOSIS — M7541 Impingement syndrome of right shoulder: Secondary | ICD-10-CM

## 2016-06-13 DIAGNOSIS — M25511 Pain in right shoulder: Secondary | ICD-10-CM

## 2016-06-13 DIAGNOSIS — R569 Unspecified convulsions: Secondary | ICD-10-CM

## 2016-06-13 DIAGNOSIS — I611 Nontraumatic intracerebral hemorrhage in hemisphere, cortical: Secondary | ICD-10-CM

## 2016-06-13 DIAGNOSIS — I69249 Monoplegia of lower limb following other nontraumatic intracranial hemorrhage affecting unspecified side: Secondary | ICD-10-CM | POA: Diagnosis not present

## 2016-06-13 MED ORDER — GABAPENTIN 300 MG PO CAPS: 300.0000 mg | ORAL_CAPSULE | Freq: Every day | ORAL | 1 refills | Status: DC

## 2016-06-13 MED FILL — GABAPENTIN 300 MG CAPSULE: 300 | 30 days supply | Qty: 30 | Fill #0

## 2016-06-13 NOTE — Progress Notes (Signed)
Subjective:    Patient ID: [name], female    DOB: Nov 03, 1960, 56 y.o.   MRN: [id number]  HPI  56 year old RH-female with history of morbid obesity, HTN, iron deficiency anemia who presents for follow up after right temporal intracranial hemorrhage.  Last clinic visit 05/02/16.  At that time she was instructed to go to pool therapy, which she has not done.  She states she never picked up the Gabapentin.  She has not seen the dietitian. She states her shoulder still causes her pain.  Denies falls. She continues to have difficulty with tingling and burning pain.    Pain Inventory Average Pain 6 Pain Right Now 6 My pain is sharp, dull and stabbing  In the last 24 hours, has pain interfered with the following? General activity 5 Relation with others 4 Enjoyment of life 5 What TIME of day is your pain at its worst? EVENING, night Sleep (in general) NA  Pain is worse with: inactivity and some activites Pain improves with: heat/ice and medication Relief from Meds: 0  Mobility walk with assistance use a cane ability to climb steps?  yes do you drive?  no transfers alone Do you have any goals in this area?  yes  Function not employed: date last employed 10-06-15 I need assistance with the following:  meal prep, household duties and shopping Do you have any goals in this area?  yes  Neuro/Psych weakness spasms dizziness  Prior Studies Any changes since last visit?  no  Physicians involved in your care Any changes since last visit?  no   Family History  Problem Relation Age of Onset  . Diabetes Father   . Hypertension Father   . Hypertension Sister   . Stroke Brother    Social History   Social History  . Marital status: Married    Spouse name: N/A  . Number of children: N/A  . Years of education: N/A   Social History Main Topics  . Smoking status: Never Smoker  . Smokeless tobacco: Never Used  . Alcohol use No  . Drug use: No  . Sexual activity: Not  Asked   Other Topics Concern  . None   Social History Narrative  . None   Past Surgical History:  Procedure Laterality Date  . CARDIAC CATHETERIZATION  04/2010   Dr. Terrence Dupont   Past Medical History:  Diagnosis Date  . Headache   . Hypertension   . Iron deficiency anemia   . Seizures ([place])   . Stroke ([place])    BP 126/79   Pulse 73   LMP 02/17/2011   SpO2 92%   Opioid Risk Score:   Fall Risk Score:  `1  Depression screen PHQ 2/9  Depression screen [facility] 2/9 06/13/2016 05/22/2016 04/22/2016 02/20/2016 11/07/2015 11/02/2015 10/23/2015  Decreased Interest 3 1 1 1  (No Data) 3 3  Down, Depressed, Hopeless 1 2 2 1  (No Data) 1 3  PHQ - 2 Score 4 3 3 2  - 4 6  Altered sleeping - 0 2 2 2 3 3   Tired, decreased energy - 2 1 1 3 3 3   Change in appetite - 2 1 2 2 1 3   Feeling bad or failure about yourself  - 0 2 1 0 0 3  Trouble concentrating - 0 2 0 2 3 3   Moving slowly or fidgety/restless - 1 0 0 0 3 3  Suicidal thoughts - 0 0 0 0 0 3  PHQ-9 Score -  8 11 8  - 17 27  Difficult doing work/chores - Very difficult - - - Extremely dIfficult -    Review of Systems  Constitutional:       Bladder control problems   HENT: Negative.   Eyes: Negative.   Respiratory: Negative.   Cardiovascular: Negative.   Gastrointestinal: Negative.   Endocrine: Negative.   Genitourinary: Negative.   Musculoskeletal: Positive for arthralgias and gait problem.  Skin: Negative.   Neurological: Positive for dizziness, seizures, weakness and numbness.       Tingling Spasms    Hematological: Negative.   Psychiatric/Behavioral: Negative.   All other systems reviewed and are negative.      Objective:   Physical Exam Constitutional: She appears well-developed and well-nourished. NAD. Morbidly obese.  HENT: Normocephalic and atraumatic.  Eyes: EOMI. No discharge.  Cardiovascular: RRR. No JVD. Respiratory: Effort normal and breath sounds normal.  GI: Soft. Bowel sounds are normal.  Musculoskeletal:    No edema. +TTP right shoulder. Pain with ROM right shoulder Neurological: She is alert and oriented.  Motor: RUE: Slightly limited due to pain, otherwise 5/5 throughout. Skin: Skin is warm and dry. Intact.  Psychiatric: She has a normal mood and affect.      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  1.  S/p right temporal intracranial hemorrhage  To start aquatic therapy, reminded again  Cont meds  Will trial Gabapentin 300qhs, need to try, pt did not pick up medication   2. Depression   Pt states she is managing with her chaplain and scriptures  Still does not want medications at present, states she does better when she is able to go to church.  3. New onset seizures  Cont meds  4. Morbid obesity  Referral to dietitian, encouraged follow up, pt has not seen one  Cont to encourage weight loss  5. Muscle spasms  Pt refusing medications, but willing to try if necessary.  Encouraged ROM and stretching  6. Abnormality of gait - post stroke  Cont cane  Cont therapies  7. Right shoulder pain  Xrays reviewed with some degenerative change right shoulder  Cont Voltaren gel   Will consider steroid injection in future, she still does not want  Will consider NSAIDs in future

## 2016-06-27 ENCOUNTER — Telehealth: Payer: Self-pay

## 2016-06-27 MED FILL — METFORMIN HCL ER 500 MG TAB: 500 | 30 days supply | Qty: 30 | Fill #2

## 2016-06-27 MED FILL — FERROUS SULFATE 325 MG TAB: 325 (65 FE) | 30 days supply | Qty: 60 | Fill #2

## 2016-06-27 NOTE — Telephone Encounter (Signed)
Called pt to see about getting colonoscopy scheduled

## 2016-07-01 MED FILL — METOPROLOL TARTRATE 50 MG T: 50 | 30 days supply | Qty: 60 | Fill #2

## 2016-07-16 ENCOUNTER — Other Ambulatory Visit: Payer: Self-pay | Admitting: Internal Medicine

## 2016-07-16 MED ORDER — LEVETIRACETAM 500 MG PO TABS: 750.0000 mg | ORAL_TABLET | Freq: Two times a day (BID) | ORAL | 0 refills | Status: DC

## 2016-07-16 MED ORDER — LEVETIRACETAM 750 MG PO TABS: 750.0000 mg | ORAL_TABLET | Freq: Two times a day (BID) | ORAL | 2 refills | Status: DC

## 2016-07-16 MED FILL — ?LEVETIRACETAM 500 MG TABLE: 500 | 25 days supply | Qty: 75 | Fill #0

## 2016-07-16 NOTE — Progress Notes (Signed)
dw w/ pharm. Pt requesting renewal on keppra. Pt seen by neuro 04/03/16 and recd continue keppra, renewed today.

## 2016-07-26 MED FILL — AMLODIPINE BESYLATE 10 MG T: 10 | 90 days supply | Qty: 90 | Fill #1

## 2016-07-26 MED FILL — METFORMIN HCL ER 500 MG TAB: 500 | 30 days supply | Qty: 30 | Fill #3

## 2016-07-30 MED FILL — FERROUS SULFATE 325 MG TAB: 325 (65 FE) | 30 days supply | Qty: 60 | Fill #3

## 2016-07-30 MED FILL — METOPROLOL TARTRATE 50 MG T: 50 | 30 days supply | Qty: 60 | Fill #3

## 2016-07-30 MED FILL — ?CHLORTHALIDONE 25 MG TABLE: 25 | 90 days supply | Qty: 90 | Fill #1

## 2016-08-08 ENCOUNTER — Other Ambulatory Visit: Payer: Self-pay | Admitting: Internal Medicine

## 2016-08-08 ENCOUNTER — Encounter: Payer: Self-pay | Admitting: Internal Medicine

## 2016-08-09 ENCOUNTER — Encounter: Payer: Self-pay | Admitting: Internal Medicine

## 2016-08-12 ENCOUNTER — Encounter: Payer: Self-pay | Admitting: Internal Medicine

## 2016-08-12 MED FILL — ?LEVETIRACETAM 500 MG TABLE: 500 | 25 days supply | Qty: 75 | Fill #0

## 2016-08-27 DIAGNOSIS — Z0271 Encounter for disability determination: Secondary | ICD-10-CM

## 2016-08-28 MED FILL — FERROUS SULFATE 325 MG TAB: 325 (65 FE) | 30 days supply | Qty: 60 | Fill #0

## 2016-08-28 MED FILL — METOPROLOL TARTRATE 50 MG T: 50 | 30 days supply | Qty: 60 | Fill #3

## 2016-08-28 MED FILL — VOLTAREN 1% GEL: 1 | 12 days supply | Qty: 100 | Fill #1

## 2016-08-28 MED FILL — METFORMIN HCL ER 500 MG TAB: 500 | 30 days supply | Qty: 30 | Fill #4

## 2016-09-09 ENCOUNTER — Telehealth: Payer: Self-pay | Admitting: Internal Medicine

## 2016-09-09 ENCOUNTER — Other Ambulatory Visit: Payer: Self-pay | Admitting: Pharmacist

## 2016-09-09 MED ORDER — LEVETIRACETAM 500 MG PO TABS: ORAL_TABLET | ORAL | 0 refills | Status: DC

## 2016-09-09 MED FILL — ?LEVETIRACETAM 500 MG TABLE: 500 | 25 days supply | Qty: 75 | Fill #0

## 2016-09-09 NOTE — Telephone Encounter (Signed)
Pt stopped me and asked if she could ask me a question. Pt asked about writing the letter. Went to speak with Dr. Doreene Burke and per Dr. Doreene Burke we are unable to write the letter especially since she has not been seen since January. Per Dr. Doreene Burke pt will need to schedule an appointment as soon as possible maybe for tomorrow if there are any openings but if not we can squeeze patient in. Will make pt aware. Pt is aware and she scheduled an appointment with Dr. Jarold Song for 09/10/2016 @945am 

## 2016-09-09 NOTE — Telephone Encounter (Signed)
PT came to the office, to request a letter to be sent to the [employer], since she can not paid her bill since she had a stroke and can't work, she understood that her PCP Dr. Janne Napoleon is not long with the company so she ask if you as the Medical Director can write the letter for her, please call her back.Marland KitchenMarland Kitchen

## 2016-09-10 ENCOUNTER — Encounter: Payer: Self-pay | Admitting: Family Medicine

## 2016-09-10 ENCOUNTER — Ambulatory Visit: Payer: Medicaid Other | Attending: Family Medicine | Admitting: Family Medicine

## 2016-09-10 VITALS — BP 102/65 | HR 77 | Temp 98.2°F | Resp 18 | Ht 59.0 in | Wt 280.0 lb

## 2016-09-10 DIAGNOSIS — I611 Nontraumatic intracerebral hemorrhage in hemisphere, cortical: Secondary | ICD-10-CM

## 2016-09-10 DIAGNOSIS — R7303 Prediabetes: Secondary | ICD-10-CM | POA: Diagnosis not present

## 2016-09-10 DIAGNOSIS — D509 Iron deficiency anemia, unspecified: Secondary | ICD-10-CM | POA: Diagnosis not present

## 2016-09-10 DIAGNOSIS — I618 Other nontraumatic intracerebral hemorrhage: Secondary | ICD-10-CM | POA: Insufficient documentation

## 2016-09-10 DIAGNOSIS — G8929 Other chronic pain: Secondary | ICD-10-CM | POA: Insufficient documentation

## 2016-09-10 DIAGNOSIS — Z7984 Long term (current) use of oral hypoglycemic drugs: Secondary | ICD-10-CM | POA: Insufficient documentation

## 2016-09-10 DIAGNOSIS — M25511 Pain in right shoulder: Secondary | ICD-10-CM | POA: Diagnosis not present

## 2016-09-10 DIAGNOSIS — F4321 Adjustment disorder with depressed mood: Secondary | ICD-10-CM | POA: Diagnosis not present

## 2016-09-10 DIAGNOSIS — Z7982 Long term (current) use of aspirin: Secondary | ICD-10-CM | POA: Insufficient documentation

## 2016-09-10 DIAGNOSIS — F419 Anxiety disorder, unspecified: Secondary | ICD-10-CM | POA: Diagnosis not present

## 2016-09-10 DIAGNOSIS — I10 Essential (primary) hypertension: Secondary | ICD-10-CM | POA: Diagnosis not present

## 2016-09-10 MED ORDER — METOPROLOL TARTRATE 100 MG PO TABS
100.0000 mg | ORAL_TABLET | Freq: Two times a day (BID) | ORAL | 5 refills | Status: DC
Start: 2016-09-10 — End: 2017-04-21

## 2016-09-10 MED ORDER — ATORVASTATIN CALCIUM 20 MG PO TABS: 20.0000 mg | ORAL_TABLET | Freq: Every day | ORAL | 5 refills | Status: DC

## 2016-09-10 MED ORDER — LEVETIRACETAM 500 MG PO TABS
ORAL_TABLET | ORAL | 6 refills | Status: DC
Start: 2016-09-10 — End: 2016-10-02

## 2016-09-10 MED ORDER — FLUOXETINE HCL 20 MG PO TABS: 20.0000 mg | ORAL_TABLET | Freq: Every day | ORAL | 5 refills | Status: DC

## 2016-09-10 MED ORDER — DICLOFENAC SODIUM 1 % TD GEL: 2.0000 g | Freq: Four times a day (QID) | TRANSDERMAL | 3 refills | Status: DC

## 2016-09-10 MED FILL — ATORVASTATIN 20 MG TABLET: 20 | 30 days supply | Qty: 30 | Fill #0

## 2016-09-10 MED FILL — VOLTAREN 1% GEL: 1 | 12 days supply | Qty: 100 | Fill #0

## 2016-09-10 MED FILL — ?FLUOXETINE HCL 20 MG CAP: 20 | 30 days supply | Qty: 30 | Fill #0

## 2016-09-10 MED FILL — ?METOPROLOL 100 MG TABLET: 100 | 30 days supply | Qty: 60 | Fill #0

## 2016-09-10 NOTE — Patient Instructions (Signed)

## 2016-09-10 NOTE — Progress Notes (Signed)
Patient is here for f/up HTN  Patient complains about right shoulder pain

## 2016-09-10 NOTE — Progress Notes (Signed)
Subjective:  Patient ID: [name], female    DOB: [birth year]  Age: 56 y.o. 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 She was previously followed by Dr. Janne Napoleon.  She has been compliant with her medications but is concerned that she takes too many medications. Complains of swelling in both ankles especially when she has to sit for prolonged periods.  Complains of right shoulder pain which makes it difficult to do her hair. Review of her notes indicate she had complained of this at her visit with the rehabilitation physician and had declined cortisone injections. She applies Voltaren gel to the shoulder to ease the pain.  She complains of an allergic reaction to gabapentin - she developed jitteriness, anxiety and couldn't sit still after she was placed gabapentin to visit with rehabilitation medicine after she had complained of tingling and numbness in her hand.  She endorses anxiety and depression and breaks down crying; and she is sad because she could previously controlled these symptoms but now is unable to. Denies suicidal ideation and intent and has declined addition of antidepressants in the past because she does not want an "addictive medication".  Past Medical History:  Diagnosis Date  . Headache   . Hypertension   . Iron deficiency anemia   . Seizures ([place])   . Stroke [facility])     Past Surgical History:  Procedure Laterality Date  . CARDIAC CATHETERIZATION  04/2010   Dr. Terrence Dupont    Allergies  Allergen Reactions  . Eggs Or Egg-Derived Products   . Gabapentin     Jittery feeling, dyspnea, crawling sensation  . Gadolinium Derivatives Itching    Immediately after gad injection pt. complained of tongue numbness and lower lip/  then had rt side of body tingling/ pt. Was assessed by Dr. Jeralyn Ruths before leaving/ no medications were administered/     Outpatient Medications Prior to Visit  Medication Sig Dispense Refill  . aspirin EC 81 MG tablet Take 1 tablet (81 mg total) by mouth daily.    . chlorthalidone (HYGROTON) 25 MG tablet Take 1 tablet (25 mg total) by mouth daily. 90 tablet 3  . fluticasone (FLONASE) 50 MCG/ACT nasal spray Place 1 spray into both nostrils daily as needed for allergies or rhinitis.    . hydroxypropyl methylcellulose / hypromellose (ISOPTO TEARS / GONIOVISC) 2.5 % ophthalmic solution Place 2 drops into both eyes as needed (irritation). 15 mL 12  . metFORMIN (GLUCOPHAGE XR) 500 MG 24 hr tablet Take 1 tablet (500 mg total) by mouth daily with breakfast. 90 tablet 3  . amLODipine (NORVASC) 10 MG tablet Take 1 tablet (10 mg total) by mouth daily. 90 tablet 3  . diclofenac sodium (VOLTAREN) 1 % GEL Apply 2 g topically 4 (four) times daily. 1 Tube 3  . ferrous sulfate (FERROUSUL) 325 (65 FE) MG tablet Take 1 tablet (325 mg total) by mouth 2 (two) times daily with a meal. 60 tablet 3  . gabapentin (NEURONTIN) 300 MG capsule Take 1 capsule (300 mg total) by mouth at bedtime. 30 capsule 1  . levETIRAcetam (KEPPRA) 500 MG tablet TAKE 1 & 1/2 TABLETS BY MOUTH TWICE DAILY. 75 tablet 0  . metoprolol (LOPRESSOR) 50 MG tablet Take 1 tablet (50 mg total) by mouth 2 (two) times  daily. 60 tablet 3   No facility-administered medications prior to visit.     ROS Review of Systems  Constitutional: Negative for activity change, appetite change and fatigue.  HENT: Negative for congestion, sinus pressure and sore throat.   Eyes: Negative for visual disturbance.  Respiratory: Negative for cough, chest tightness, shortness of breath and wheezing.   Cardiovascular: Positive for leg swelling. Negative for chest pain and palpitations.  Gastrointestinal: Negative for abdominal distention, abdominal pain and constipation.    Endocrine: Negative for polydipsia.  Genitourinary: Negative for dysuria and frequency.  Musculoskeletal:       See history of present illness  Skin: Negative for rash.  Neurological: Positive for weakness. Negative for tremors, light-headedness and numbness.  Hematological: Does not bruise/bleed easily.  Psychiatric/Behavioral: Negative for agitation and behavioral problems.    Objective:  BP 102/65 (BP Location: Left Arm, Patient Position: Sitting, Cuff Size: Normal)   Pulse 77   Temp 98.2 F (36.8 C) (Oral)   Resp 18   Ht '4\' 11"'  (1.499 m)   Wt 280 lb (127 kg)   LMP 02/17/2011   SpO2 99%   BMI 56.55 kg/m   BP/Weight 09/10/2016 06/13/2016 1/61/0960  Systolic BP 454 098 -  Diastolic BP 65 79 -  Wt. (Lbs) 280 - 270.4  BMI 56.55 - 54.61      Physical Exam  Constitutional: She is oriented to person, place, and time. She appears well-developed and well-nourished.  Morbidly obese  Cardiovascular: Normal rate, normal heart sounds and intact distal pulses.   No murmur heard. Pulmonary/Chest: Effort normal and breath sounds normal. She has no wheezes. She has no rales. She exhibits no tenderness.  Abdominal: Soft. Bowel sounds are normal. She exhibits no distension and no mass. There is no tenderness.  Musculoskeletal: Normal range of motion. She exhibits edema (1+ bilateral nonpitting pedal edema) and tenderness (Slight tenderness on extreme range motion of right shoulder).  Neurological: She is alert and oriented to person, place, and time.  Motor strength: Right upper extremity and right lower extremity-4/5 Left upper extremity and left lower extremity-5/5  Skin: Skin is warm and dry.  Psychiatric: She has a normal mood and affect.      CBC    Component Value Date/Time   WBC 7.6 02/20/2016 0953   RBC 4.92 02/20/2016 0953   HGB 11.6 (L) 02/20/2016 0953   HCT 37.8 02/20/2016 0953   PLT 370 02/20/2016 0953   MCV 76.8 (L) 02/20/2016 0953   MCH 23.6 (L) 02/20/2016  0953   MCHC 30.7 (L) 02/20/2016 0953   RDW 16.7 (H) 02/20/2016 0953   LYMPHSABS 3,268 02/20/2016 0953   MONOABS 532 02/20/2016 0953   EOSABS 152 02/20/2016 0953   BASOSABS 0 02/20/2016 0953    Lab Results  Component Value Date   HGBA1C 5.8 02/20/2016    Assessment & Plan:   1. Prediabetes Controlled with A1c of 5.8 Continue metformin  2. Nontraumatic cortical hemorrhage of cerebral hemisphere, unspecified laterality (HCC) No recent seizures Risk factor modification - levETIRAcetam (KEPPRA) 500 MG tablet; TAKE 1 & 1/2 TABLETS BY MOUTH TWICE DAILY.  Dispense: 75 tablet; Refill: 6 - atorvastatin (LIPITOR) 20 MG tablet; Take 1 tablet (20 mg total) by mouth daily.  Dispense: 30 tablet; Refill: 5  3. Benign essential HTN Controlled Discontinue amlodipine due to pedal edema Increased dose of metoprolol from 50 mg twice daily to 100 mg twice daily Continue chlorthalidone - metoprolol tartrate (LOPRESSOR) 100 MG tablet; Take 1 tablet (  100 mg total) by mouth 2 (two) times daily.  Dispense: 60 tablet; Refill: 5 - CMP14+EGFR  4. Adjustment disorder with depressed mood Commenced Prozac Explained onset of action, non-habit-forming nature of Prozac - FLUoxetine (PROZAC) 20 MG tablet; Take 1 tablet (20 mg total) by mouth daily.  Dispense: 30 tablet; Refill: 5  5. Chronic right shoulder pain Slight right sided weakness also contributory Declines cortisone injection as per rehabilitation medicine - diclofenac sodium (VOLTAREN) 1 % GEL; Apply 2 g topically 4 (four) times daily.  Dispense: 1 Tube; Refill: 3  6. Iron deficiency anemia, unspecified iron deficiency anemia type Last hematocrit was normal at 37.6, six months ago Discontinue ferrous sulfate due to complains of diarrhea - CBC with Differential/Platelet   Meds ordered this encounter  Medications  . diclofenac sodium (VOLTAREN) 1 % GEL    Sig: Apply 2 g topically 4 (four) times daily.    Dispense:  1 Tube    Refill:  3  .  metoprolol tartrate (LOPRESSOR) 100 MG tablet    Sig: Take 1 tablet (100 mg total) by mouth 2 (two) times daily.    Dispense:  60 tablet    Refill:  5    Discontinue previous dose; discontinue amlodipine  . levETIRAcetam (KEPPRA) 500 MG tablet    Sig: TAKE 1 & 1/2 TABLETS BY MOUTH TWICE DAILY.    Dispense:  75 tablet    Refill:  6  . FLUoxetine (PROZAC) 20 MG tablet    Sig: Take 1 tablet (20 mg total) by mouth daily.    Dispense:  30 tablet    Refill:  5  . atorvastatin (LIPITOR) 20 MG tablet    Sig: Take 1 tablet (20 mg total) by mouth daily.    Dispense:  30 tablet    Refill:  5    Follow-up: Return in about 1 month (around 10/10/2016) for Follow-up on hypertension.   This note has been created with Surveyor, quantity. Any transcriptional errors are unintentional.     Arnoldo Morale MD

## 2016-09-11 LAB — CBC WITH DIFFERENTIAL/PLATELET
BASOS: 0 %
Basophils Absolute: 0 10*3/uL (ref 0.0–0.2)
EOS (ABSOLUTE): 0.2 10*3/uL (ref 0.0–0.4)
EOS: 2 %
HEMATOCRIT: 36.4 % (ref 34.0–46.6)
HEMOGLOBIN: 11.5 g/dL (ref 11.1–15.9)
Immature Grans (Abs): 0 10*3/uL (ref 0.0–0.1)
Immature Granulocytes: 0 %
LYMPHS ABS: 3 10*3/uL (ref 0.7–3.1)
Lymphs: 35 %
MCH: 24.5 pg — AB (ref 26.6–33.0)
MCHC: 31.6 g/dL (ref 31.5–35.7)
MCV: 77 fL — ABNORMAL LOW (ref 79–97)
MONOCYTES: 7 %
Monocytes Absolute: 0.6 10*3/uL (ref 0.1–0.9)
Neutrophils Absolute: 4.6 10*3/uL (ref 1.4–7.0)
Neutrophils: 56 %
Platelets: 341 10*3/uL (ref 150–379)
RBC: 4.7 x10E6/uL (ref 3.77–5.28)
RDW: 15.5 % — ABNORMAL HIGH (ref 12.3–15.4)
WBC: 8.4 10*3/uL (ref 3.4–10.8)

## 2016-09-11 LAB — CMP14+EGFR
ALBUMIN: 3.8 g/dL (ref 3.5–5.5)
ALT: 12 IU/L (ref 0–32)
AST: 14 IU/L (ref 0–40)
Albumin/Globulin Ratio: 0.9 — ABNORMAL LOW (ref 1.2–2.2)
Alkaline Phosphatase: 71 IU/L (ref 39–117)
BILIRUBIN TOTAL: 0.3 mg/dL (ref 0.0–1.2)
BUN / CREAT RATIO: 21 (ref 9–23)
BUN: 22 mg/dL (ref 6–24)
CO2: 28 mmol/L (ref 20–29)
Calcium: 9.7 mg/dL (ref 8.7–10.2)
Chloride: 99 mmol/L (ref 96–106)
Creatinine, Ser: 1.07 mg/dL — ABNORMAL HIGH (ref 0.57–1.00)
GFR, EST AFRICAN AMERICAN: 68 mL/min/{1.73_m2} (ref >59)
GFR, EST NON AFRICAN AMERICAN: 59 mL/min/{1.73_m2} — AB (ref >59)
GLUCOSE: 123 mg/dL — AB (ref 65–99)
Globulin, Total: 4.1 g/dL (ref 1.5–4.5)
Potassium: 3.7 mmol/L (ref 3.5–5.2)
Sodium: 144 mmol/L (ref 134–144)
TOTAL PROTEIN: 7.9 g/dL (ref 6.0–8.5)

## 2016-09-12 ENCOUNTER — Encounter: Payer: Self-pay | Admitting: Physical Medicine & Rehabilitation

## 2016-09-12 ENCOUNTER — Encounter: Payer: Medicaid Other | Attending: Physical Medicine & Rehabilitation | Admitting: Physical Medicine & Rehabilitation

## 2016-09-12 VITALS — BP 114/75 | HR 97 | Resp 12

## 2016-09-12 DIAGNOSIS — R569 Unspecified convulsions: Secondary | ICD-10-CM | POA: Insufficient documentation

## 2016-09-12 DIAGNOSIS — F329 Major depressive disorder, single episode, unspecified: Secondary | ICD-10-CM | POA: Diagnosis not present

## 2016-09-12 DIAGNOSIS — G8929 Other chronic pain: Secondary | ICD-10-CM

## 2016-09-12 DIAGNOSIS — I10 Essential (primary) hypertension: Secondary | ICD-10-CM | POA: Insufficient documentation

## 2016-09-12 DIAGNOSIS — M7541 Impingement syndrome of right shoulder: Secondary | ICD-10-CM

## 2016-09-12 DIAGNOSIS — I629 Nontraumatic intracranial hemorrhage, unspecified: Secondary | ICD-10-CM | POA: Insufficient documentation

## 2016-09-12 DIAGNOSIS — I611 Nontraumatic intracerebral hemorrhage in hemisphere, cortical: Secondary | ICD-10-CM | POA: Diagnosis not present

## 2016-09-12 DIAGNOSIS — R269 Unspecified abnormalities of gait and mobility: Secondary | ICD-10-CM | POA: Diagnosis not present

## 2016-09-12 DIAGNOSIS — I69249 Monoplegia of lower limb following other nontraumatic intracranial hemorrhage affecting unspecified side: Secondary | ICD-10-CM | POA: Diagnosis not present

## 2016-09-12 DIAGNOSIS — R7303 Prediabetes: Secondary | ICD-10-CM | POA: Insufficient documentation

## 2016-09-12 DIAGNOSIS — M25511 Pain in right shoulder: Secondary | ICD-10-CM | POA: Diagnosis not present

## 2016-09-12 DIAGNOSIS — M62838 Other muscle spasm: Secondary | ICD-10-CM | POA: Insufficient documentation

## 2016-09-12 DIAGNOSIS — Z5189 Encounter for other specified aftercare: Secondary | ICD-10-CM | POA: Diagnosis present

## 2016-09-12 DIAGNOSIS — I69398 Other sequelae of cerebral infarction: Secondary | ICD-10-CM | POA: Diagnosis not present

## 2016-09-12 MED ORDER — DULOXETINE HCL 30 MG PO CPEP
30.0000 mg | ORAL_CAPSULE | Freq: Every day | ORAL | 1 refills | Status: DC
Start: 2016-09-12 — End: 2016-10-10

## 2016-09-12 MED FILL — ?DULOXETINE HCL DR 30 MG CA: 30 MG | 30 days supply | Qty: 30 | Fill #0

## 2016-09-12 NOTE — Progress Notes (Addendum)
Subjective:    Patient ID: [name], female    DOB: 1960/07/04, 56 y.o.   MRN: [id number]  HPI  56 year old RH-female with history of morbid obesity, HTN, iron deficiency anemia who presents for follow up after right temporal intracranial hemorrhage.  Last clinic visit 06/13/16.  At that time, she was started on Gabapentin, but states she had a bad reaction.  She still has not signed up for pool therapy. She saw the dietitian, but she has not lost any weight.  Denies falls. Voltaren gel helps for shoulder pain.  Pt's brother recently passed away, causing a lot of stress.   Pain Inventory Average Pain 5 Pain Right Now 8 My pain is sharp, dull, stabbing, tingling and aching  In the last 24 hours, has pain interfered with the following? General activity 7 Relation with others 5 Enjoyment of life 7 What TIME of day is your pain at its worst? morning, night Sleep (in general) Poor  Pain is worse with: inactivity and some activites Pain improves with: heat/ice and medication Relief from Meds: 5  Mobility walk with assistance use a cane ability to climb steps?  yes do you drive?  yes transfers alone Do you have any goals in this area?  yes  Function not employed: date last employed 10-06-15 I need assistance with the following:  meal prep, household duties and shopping Do you have any goals in this area?  yes  Neuro/Psych weakness spasms dizziness  Prior Studies Any changes since last visit?  yes  Physicians involved in your care na   Family History  Problem Relation Age of Onset  . Diabetes Father   . Hypertension Father   . Hypertension Sister   . Stroke Brother    Social History   Social History  . Marital status: Married    Spouse name: N/A  . Number of children: N/A  . Years of education: N/A   Social History Main Topics  . Smoking status: Never Smoker  . Smokeless tobacco: Never Used  . Alcohol use No  . Drug use: No  . Sexual activity: Not  Asked   Other Topics Concern  . None   Social History Narrative  . None   Past Surgical History:  Procedure Laterality Date  . CARDIAC CATHETERIZATION  04/2010   Dr. Terrence Dupont   Past Medical History:  Diagnosis Date  . Headache   . Hypertension   . Iron deficiency anemia   . Seizures ([place])   . Stroke ([place])    BP 114/75   Pulse 97   Resp 12   LMP 02/17/2011   SpO2 94%   Opioid Risk Score:   Fall Risk Score:  `1  Depression screen PHQ 2/9  Depression screen [facility] 2/9 09/10/2016 06/13/2016 05/22/2016 04/22/2016 02/20/2016 11/07/2015 11/02/2015  Decreased Interest 2 3 1 1 1  (No Data) 3  Down, Depressed, Hopeless 1 1 2 2 1  (No Data) 1  PHQ - 2 Score 3 4 3 3 2  - 4  Altered sleeping 2 - 0 2 2 2 3   Tired, decreased energy 1 - 2 1 1 3 3   Change in appetite 1 - 2 1 2 2 1   Feeling bad or failure about yourself  2 - 0 2 1 0 0  Trouble concentrating 1 - 0 2 0 2 3  Moving slowly or fidgety/restless 2 - 1 0 0 0 3  Suicidal thoughts 0 - 0 0 0 0 0  PHQ-9  Score 12 - 8 11 8  - 17  Difficult doing work/chores - - Very difficult - - - Extremely dIfficult    Review of Systems  Constitutional:       Bladder control problems   HENT: Negative.   Eyes: Negative.   Respiratory: Negative.   Cardiovascular: Negative.   Gastrointestinal: Negative.   Endocrine: Negative.   Genitourinary: Negative.   Musculoskeletal: Positive for arthralgias and gait problem.  Skin: Negative.   Neurological: Positive for dizziness, seizures, weakness and numbness.       Tingling Spasms    Hematological: Negative.   Psychiatric/Behavioral: Negative.   All other systems reviewed and are negative.      Objective:   Physical Exam Constitutional: She appears well-developed and well-nourished. NAD. Morbidly obese.  HENT: Normocephalic and atraumatic.  Eyes: EOMI. No discharge.  Cardiovascular: RRR. No JVD. Respiratory: Effort normal and breath sounds normal.  GI: Soft. Bowel sounds are normal.    Musculoskeletal:  No edema. +TTP right shoulder. Pain with ROM right shoulder Neurological: She is alert and oriented.  Motor: RUE: Limited by pain, 4/5 throughout. Skin: Skin is warm and dry. Intact.  Psychiatric: She has a normal mood and affect.      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  1.S/p right temporal intracranial hemorrhage with dysesthesias  Side effects with gabapentin             To start aquatic therapy, needs to go now that she has insurance             Cont meds  Cymbalta ordered              2. Depression               Pt states she is managing with her chaplain and scriptures             Still does not want medications at present, states she does better when she is able to go to church.  3. New onset seizures             Cont meds  4. Morbid obesity  Cont follow up with dietitian             Cont to encourage weight loss  5. Muscle spasms             Pt refusing medications, but willing to try if necessary.             Encouraged ROM and stretching  6. Abnormality of gait - post stroke             Cont cane             Therapies  7. Right shoulder pain             Xrays reviewed with some degenerative change right shoulder             Cont Voltaren gel              Will consider steroid injection in future, she still does not want at present             Will consider NSAIDs in future

## 2016-09-13 ENCOUNTER — Telehealth: Payer: Self-pay | Admitting: Physical Medicine & Rehabilitation

## 2016-09-13 ENCOUNTER — Telehealth: Payer: Self-pay | Admitting: *Deleted

## 2016-09-13 DIAGNOSIS — R269 Unspecified abnormalities of gait and mobility: Secondary | ICD-10-CM

## 2016-09-13 DIAGNOSIS — M62838 Other muscle spasm: Secondary | ICD-10-CM

## 2016-09-13 DIAGNOSIS — R208 Other disturbances of skin sensation: Secondary | ICD-10-CM

## 2016-09-13 DIAGNOSIS — I69398 Other sequelae of cerebral infarction: Secondary | ICD-10-CM

## 2016-09-13 DIAGNOSIS — I611 Nontraumatic intracerebral hemorrhage in hemisphere, cortical: Secondary | ICD-10-CM

## 2016-09-13 NOTE — Telephone Encounter (Signed)
[name] wanted to patient to have aquatic therapy.  [facility] does not accept patients with Medicaid.  I called patient to see if she still wanted me to send referral over, but she does not.  She doesn't have any income coming in and can't afford it.

## 2016-09-13 NOTE — Telephone Encounter (Signed)
Rosamund called and says that the place she was told to get water therapy does not have that capability. Please advise

## 2016-09-13 NOTE — Telephone Encounter (Signed)
Thank you :)

## 2016-09-13 NOTE — Telephone Encounter (Signed)
Per Dr Posey Pronto we will but a referral in with [facility] for aquatherapy. Order placed.

## 2016-09-26 MED FILL — METFORMIN HCL ER 500 MG TAB: 500 | 30 days supply | Qty: 30 | Fill #5

## 2016-10-02 ENCOUNTER — Ambulatory Visit (INDEPENDENT_AMBULATORY_CARE_PROVIDER_SITE_OTHER): Payer: Medicaid Other | Admitting: Nurse Practitioner

## 2016-10-02 ENCOUNTER — Encounter: Payer: Self-pay | Admitting: Nurse Practitioner

## 2016-10-02 VITALS — BP 128/81 | HR 62 | Wt 276.4 lb

## 2016-10-02 DIAGNOSIS — I611 Nontraumatic intracerebral hemorrhage in hemisphere, cortical: Secondary | ICD-10-CM

## 2016-10-02 DIAGNOSIS — G40909 Epilepsy, unspecified, not intractable, without status epilepticus: Secondary | ICD-10-CM

## 2016-10-02 DIAGNOSIS — M25511 Pain in right shoulder: Secondary | ICD-10-CM

## 2016-10-02 DIAGNOSIS — G8929 Other chronic pain: Secondary | ICD-10-CM

## 2016-10-02 DIAGNOSIS — I10 Essential (primary) hypertension: Secondary | ICD-10-CM | POA: Diagnosis not present

## 2016-10-02 MED ORDER — LEVETIRACETAM 500 MG PO TABS: ORAL_TABLET | ORAL | 6 refills | Status: DC

## 2016-10-02 NOTE — Progress Notes (Signed)
[name]  PATIENT: [name] DOB: 01-06-1961   REASON FOR VISIT: follow up for stroke HISTORY FROM:patient    HISTORY OF PRESENT ILLNESS:[name] is a 56 y.o. female with history of morbid obesity and hypertension admitted on 10/06/15 for LOC and GTC seizure. CT showed right temporal small ICH, and repeat CT stable hematoma. CTA and CTV head without AVM or CVT. CUS, DVT screening and TTE unremarkable. LTM  EEG no seizure. LD 58 and A1C 5.7. She was put on keppra 750mg  bid. She was discharged to CIR with planning outpt sleep study.   11/29/15 follow up - the patient has been doing well. No seizure activity. She is not driving so far. However, she complains of right shoulder pain with limited ROM at right UE. Walks with cane at home. BP today 137/78 and also stable at home. BP controlled well with 2 BP meds.  Interval HistoryXU During the interval time, pt has been doing well. Had MRI and MRA in 11/2015 showed resolved bleeding and no AVM or aneurysm. He followed with [name] for right shoulder pain and was put on Voltaren gel which helped. She continued on keppra and no more seizures. She complains of turning head back from right lateral position fast causing dizzy, but it turning slow then no dizziness. Currently sleep study on hold due to no insurance. Still has outpt PT. BP today 132/78. UPDATE 07/11/2018CM [name], 56 year old female returns for follow-upwith a history of right temporal small ICH. Repeat CT stable and restarted on aspirin at her last visit.she has not had further stroke or TIA symptoms. She has minimal bruising or bleeding. She remains on Keppra for seizure disorder with no further seizure activity. She is back to driving without difficulty. She remains on Lipitor without complaints of myalgias. She is morbidly obese and needs a sleep study however insurance will not pay for this.She continues to see [name] for her right shoulder pain.  She states that she is doing her home exercise program She returns for reevaluation without further neurologic symptoms  REVIEW OF SYSTEMS: Full 14 system review of systems performed and notable only for those listed, all others are neg:  Constitutional: neg  Cardiovascular: leg swelling Ear/Nose/Throat: neg  Skin: neg Eyes: blurred vision Respiratory: neg Gastroitestinal: neg  Hematology/Lymphatic: neg  Endocrine: neg Musculoskeletal:neg Allergy/Immunology: neg Neurological: occasional headache Psychiatric: neg Sleep : neg   ALLERGIES: Allergies  Allergen Reactions  . Eggs Or Egg-Derived Products   . Gabapentin     Jittery feeling, dyspnea, crawling sensation  . Gadolinium Derivatives Itching    Immediately after gad injection pt. complained of tongue numbness and lower lip/ then had rt side of body tingling/ pt. Was assessed by Dr. Jeralyn Ruths before leaving/ no medications were administered/    HOME MEDICATIONS: Outpatient Medications Prior to Visit  Medication Sig Dispense Refill  . aspirin EC 81 MG tablet Take 1 tablet (81 mg total) by mouth daily.    Marland Kitchen atorvastatin (LIPITOR) 20 MG tablet Take 1 tablet (20 mg total) by mouth daily. 30 tablet 5  . chlorthalidone (HYGROTON) 25 MG tablet Take 1 tablet (25 mg total) by mouth daily. 90 tablet 3  . diclofenac sodium (VOLTAREN) 1 % GEL Apply 2 g topically 4 (four) times daily. 1 Tube 3  . DULoxetine (CYMBALTA) 30 MG capsule Take 1 capsule (30 mg total) by mouth daily. 30 capsule 1  . FLUoxetine (PROZAC) 20 MG tablet Take 1 tablet (20  mg total) by mouth daily. 30 tablet 5  . fluticasone (FLONASE) 50 MCG/ACT nasal spray Place 1 spray into both nostrils daily as needed for allergies or rhinitis.    . hydroxypropyl methylcellulose / hypromellose (ISOPTO TEARS / GONIOVISC) 2.5 % ophthalmic solution Place 2 drops into both eyes as needed (irritation). 15 mL 12  . levETIRAcetam (KEPPRA) 500 MG tablet TAKE 1 & 1/2 TABLETS BY MOUTH TWICE  DAILY. 75 tablet 6  . metFORMIN (GLUCOPHAGE XR) 500 MG 24 hr tablet Take 1 tablet (500 mg total) by mouth daily with breakfast. 90 tablet 3  . metoprolol tartrate (LOPRESSOR) 100 MG tablet Take 1 tablet (100 mg total) by mouth 2 (two) times daily. 60 tablet 5   No facility-administered medications prior to visit.     PAST MEDICAL HISTORY: Past Medical History:  Diagnosis Date  . Headache   . Hypertension   . Iron deficiency anemia   . Seizures ([place])   . Stroke [facility])     PAST SURGICAL HISTORY: Past Surgical History:  Procedure Laterality Date  . CARDIAC CATHETERIZATION  04/2010   Dr. Terrence Dupont    FAMILY HISTORY: Family History  Problem Relation Age of Onset  . Diabetes Father   . Hypertension Father   . Hypertension Sister   . Stroke Brother     SOCIAL HISTORY: Social History   Social History  . Marital status: Married    Spouse name: N/A  . Number of children: N/A  . Years of education: N/A   Occupational History  . Not on file.   Social History Main Topics  . Smoking status: Never Smoker  . Smokeless tobacco: Never Used  . Alcohol use No  . Drug use: No  . Sexual activity: Not on file   Other Topics Concern  . Not on file   Social History Narrative  . No narrative on file     PHYSICAL EXAM  Vitals:   10/02/16 1241  BP: 128/81  Pulse: 62  Weight: 276 lb 6.4 oz (125.4 kg)   Body mass index is 55.83 kg/m.  Generalized: Well developed, morbidly obese femalein no acute distress  Head: normocephalic and atraumatic,. Oropharynx benign  Neck: Supple, no carotid bruits  Cardiac: Regular rate rhythm, no murmur  Musculoskeletal: No deformity   Neurological examination   Mentation: Alert oriented to time, place, history taking. Attention span and concentration appropriate. Recent and remote memory intact.  Follows all commands speech and language fluent.   Cranial nerve II-XII: Pupils were equal round reactive to light extraocular movements were  full, visual field were full on confrontational test. Facial sensation and strength were normal. hearing was intact to finger rubbing bilaterally. Uvula tongue midline. head turning and shoulder shrug were normal and symmetric.Tongue protrusion into cheek strength was normal. Motor: normal bulk and tone, full strength in the BUE, BLE, except right upper extremity 4+ due to shoulder pain Sensory: normal and symmetric to light touch,  Coordination: finger-nose-finger, heel-to-shin bilaterally, no dysmetria Reflexes: 1+ upper lower and symmetric, plantar responses were flexor bilaterally. Gait and Station: Rising up from seated position without assistance,slow steady broad-based gait with cane DIAGNOSTIC DATA (LABS, IMAGING, TESTING) - I reviewed patient records, labs, notes, testing and imaging myself where available.  Lab Results  Component Value Date   WBC 8.4 09/10/2016   HGB 11.5 09/10/2016   HCT 36.4 09/10/2016   MCV 77 (L) 09/10/2016   PLT 341 09/10/2016      Component Value Date/Time  NA 144 09/10/2016 1109   K 3.7 09/10/2016 1109   CL 99 09/10/2016 1109   CO2 28 09/10/2016 1109   GLUCOSE 123 (H) 09/10/2016 1109   GLUCOSE 113 (H) 02/20/2016 0953   BUN 22 09/10/2016 1109   CREATININE 1.07 (H) 09/10/2016 1109   CREATININE 0.86 02/20/2016 0953   CALCIUM 9.7 09/10/2016 1109   PROT 7.9 09/10/2016 1109   ALBUMIN 3.8 09/10/2016 1109   AST 14 09/10/2016 1109   ALT 12 09/10/2016 1109   ALKPHOS 71 09/10/2016 1109   BILITOT 0.3 09/10/2016 1109   GFRNONAA 59 (L) 09/10/2016 1109   GFRNONAA 76 02/20/2016 0953   GFRAA 68 09/10/2016 1109   GFRAA 88 02/20/2016 0953   Lab Results  Component Value Date   CHOL 112 10/08/2015   HDL 42 10/08/2015   LDLCALC 58 10/08/2015   TRIG 62 10/08/2015   CHOLHDL 2.7 10/08/2015   Lab Results  Component Value Date   HGBA1C 5.8 02/20/2016      ASSESSMENT AND PLAN 56 y.o. African American female with PMH of morbid obesity and hypertension  admitted on 10/06/15 for LOC and GTC seizure. CT showed right temporal small ICH, and repeat CT stable hematoma. CTA and CTV head without AVM or CVT. CUS, DVT screening and TTE unremarkable. LTM  EEG no seizure. LD 58 and A1C 5.7. She was put on keppra 750mg  bid. She was discharged to CIR with planning outpt sleep study. During the interval time, the patient has no seizure activity. Complains of right shoulder pain with limited ROM at right UE.  Repeat MRI and MRA showed resolved bleeding and no underlying vascular abnormalities. Now on  baby ASA. Sleep study on hold for evaluation of OSA due to insurance at this time. Seizure free for 6 months now and able to drive. Continue follow up with [name] for shoulder pain.    Continue ASA 81mg  daily for stroke prevention Continue follow up with Dr. Lottie Rater for rehab and shoulder pain will do sleep study for sleep apnea evaluation. When insurance approved  Follow up with your primary care physician for stroke risk factor modification. Recommend maintain blood pressure goal <130/80, diabetes with hemoglobin A1c goal below 7.0% and lipids with LDL cholesterol goal below 70 mg/dL.  Continue home exercise program  Continue the keppra for seizure control Call for seizure activity If seizure recurs, you can not drive again for 6 months.   -Please maintain seizure precautions.  -follow up in 6 months Dennie Bible, [facility], [facility], APRN  [facility] [address] [place], [place] [address] [phone]

## 2016-10-02 NOTE — Patient Instructions (Addendum)
Continue ASA 81mg  daily for stroke prevention Continue follow up with Dr. Lottie Rater for rehab and shoulder pain will do sleep study for sleep apnea evaluation. When insurance approved  Follow up with your primary care physician for stroke risk factor modification. Recommend maintain blood pressure goal <130/80, diabetes with hemoglobin A1c goal below 7.0% and lipids with LDL cholesterol goal below 70 mg/dL.  Continue home exercise program  Continue the keppra for seizure control Call for seizure activity If seizure recurs, you can not drive again for 6 months.   -Please maintain seizure precautions.  -follow up in 6 months

## 2016-10-03 MED FILL — ?FLUOXETINE HCL 20 MG CAP: 20 | 30 days supply | Qty: 30 | Fill #1

## 2016-10-03 MED FILL — ?LEVETIRACETAM 500 MG TABLE: 500 | 30 days supply | Qty: 90 | Fill #0

## 2016-10-03 MED FILL — ?ATORVASTATIN 20 MG TABLET: 20 | 30 days supply | Qty: 30 | Fill #1

## 2016-10-03 NOTE — Progress Notes (Signed)
I reviewed above note and agree with the assessment and plan.  Rosalin Hawking, MD PhD Stroke Neurology 10/03/2016 11:53 AM

## 2016-10-10 ENCOUNTER — Encounter: Payer: Self-pay | Admitting: Physical Medicine & Rehabilitation

## 2016-10-10 ENCOUNTER — Other Ambulatory Visit: Payer: Self-pay | Admitting: Physical Medicine & Rehabilitation

## 2016-10-10 ENCOUNTER — Other Ambulatory Visit: Payer: Self-pay

## 2016-10-10 ENCOUNTER — Encounter: Payer: Medicaid Other | Attending: Physical Medicine & Rehabilitation | Admitting: Physical Medicine & Rehabilitation

## 2016-10-10 VITALS — BP 121/80 | HR 64

## 2016-10-10 DIAGNOSIS — G8929 Other chronic pain: Secondary | ICD-10-CM | POA: Diagnosis not present

## 2016-10-10 DIAGNOSIS — M79604 Pain in right leg: Secondary | ICD-10-CM

## 2016-10-10 DIAGNOSIS — R269 Unspecified abnormalities of gait and mobility: Secondary | ICD-10-CM | POA: Diagnosis not present

## 2016-10-10 DIAGNOSIS — I629 Nontraumatic intracranial hemorrhage, unspecified: Secondary | ICD-10-CM | POA: Diagnosis not present

## 2016-10-10 DIAGNOSIS — M62838 Other muscle spasm: Secondary | ICD-10-CM | POA: Diagnosis not present

## 2016-10-10 DIAGNOSIS — M7541 Impingement syndrome of right shoulder: Secondary | ICD-10-CM | POA: Diagnosis not present

## 2016-10-10 DIAGNOSIS — R7303 Prediabetes: Secondary | ICD-10-CM | POA: Insufficient documentation

## 2016-10-10 DIAGNOSIS — F329 Major depressive disorder, single episode, unspecified: Secondary | ICD-10-CM | POA: Insufficient documentation

## 2016-10-10 DIAGNOSIS — I10 Essential (primary) hypertension: Secondary | ICD-10-CM | POA: Insufficient documentation

## 2016-10-10 DIAGNOSIS — M25511 Pain in right shoulder: Secondary | ICD-10-CM | POA: Diagnosis not present

## 2016-10-10 DIAGNOSIS — R569 Unspecified convulsions: Secondary | ICD-10-CM | POA: Diagnosis not present

## 2016-10-10 DIAGNOSIS — I69249 Monoplegia of lower limb following other nontraumatic intracranial hemorrhage affecting unspecified side: Secondary | ICD-10-CM | POA: Insufficient documentation

## 2016-10-10 DIAGNOSIS — Z5189 Encounter for other specified aftercare: Secondary | ICD-10-CM | POA: Insufficient documentation

## 2016-10-10 DIAGNOSIS — I69398 Other sequelae of cerebral infarction: Secondary | ICD-10-CM

## 2016-10-10 MED ORDER — DULOXETINE HCL 60 MG PO CPEP: 60.0000 mg | ORAL_CAPSULE | Freq: Every day | ORAL | 1 refills | Status: DC

## 2016-10-10 MED ORDER — METHOCARBAMOL 500 MG PO TABS: 500.0000 mg | ORAL_TABLET | Freq: Two times a day (BID) | ORAL | 1 refills | Status: DC | PRN

## 2016-10-10 MED FILL — ?DULOXETINE HCL DR 30 MG CA: 30 MG | 30 days supply | Qty: 30 | Fill #1

## 2016-10-10 NOTE — Progress Notes (Signed)
Subjective:    Patient ID: [name], female    DOB: 13-Jan-1961, 56 y.o.   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  Last clinic visit 09/12/16.  Since last visit, she has been doing HEP.  She states her depression has improved. She continues to see dietitian and has lost 1 pound.  Her muscles spasms have significantly improved.  Shoulder pain improved with Voltaren gel. Cane for ambulation. Denies falls. Today, she complains of right leg pain.  She described it as cramping.    Pain Inventory Average Pain 4 Pain Right Now 3 My pain is aching  In the last 24 hours, has pain interfered with the following? General activity 3 Relation with others 4 Enjoyment of life 3 What TIME of day is your pain at its worst? morning, night Sleep (in general) Fair  Pain is worse with: some activites Pain improves with: heat/ice and medication Relief from Meds: 7  Mobility walk with assistance use a cane how many minutes can you walk? 30 ability to climb steps?  no do you drive?  yes transfers alone Do you have any goals in this area?  yes  Function not employed: date last employed 10-06-15 I need assistance with the following:  meal prep, household duties and shopping  Neuro/Psych weakness trouble walking  Prior Studies Any changes since last visit?  no  Physicians involved in your care Any changes since last visit?  no   Family History  Problem Relation Age of Onset  . Diabetes Father   . Hypertension Father   . Hypertension Sister   . Stroke Brother    Social History   Social History  . Marital status: Married    Spouse name: N/A  . Number of children: N/A  . Years of education: N/A   Social History Main Topics  . Smoking status: Never Smoker  . Smokeless tobacco: Never Used  . Alcohol use No  . Drug use: No  . Sexual  activity: Not Asked   Other Topics Concern  . None   Social History Narrative  . None   Past Surgical History:  Procedure Laterality Date  . CARDIAC CATHETERIZATION  04/2010   Dr. Terrence Dupont   Past Medical History:  Diagnosis Date  . Headache   . Hypertension   . Iron deficiency anemia   . Seizures ([place])   . Stroke ([place])    BP 121/80   Pulse 64   LMP 02/17/2011   SpO2 95%   Opioid Risk Score:   Fall Risk Score:  `1  Depression screen PHQ 2/9  Depression screen [facility] 2/9 10/10/2016 09/10/2016 06/13/2016 05/22/2016 04/22/2016 02/20/2016 11/07/2015  Decreased Interest 1 2 3 1 1 1  (No Data)  Down, Depressed, Hopeless 1 1 1 2 2 1  (No Data)  PHQ - 2 Score 2 3 4 3 3 2  -  Altered sleeping - 2 - 0 2 2 2   Tired, decreased energy - 1 - 2 1 1 3   Change in appetite - 1 - 2 1 2 2   Feeling bad or failure about yourself  - 2 - 0 2 1 0  Trouble concentrating - 1 - 0 2 0 2  Moving slowly or fidgety/restless - 2 - 1 0 0 0  Suicidal thoughts - 0 - 0 0 0 0  PHQ-9 Score - 12 - 8 11  8 -  Difficult doing work/chores - - - Very difficult - - -    Review of Systems  Constitutional:       Bladder control problems   HENT: Negative.   Eyes: Negative.   Respiratory: Negative.   Cardiovascular: Negative.   Gastrointestinal: Negative.   Endocrine: Negative.   Genitourinary: Negative.   Musculoskeletal: Positive for gait problem.  Skin: Negative.   Neurological: Positive for weakness.       Tingling Spasms    Hematological: Negative.   Psychiatric/Behavioral: Negative.   All other systems reviewed and are negative.     Objective:   Physical Exam Constitutional: She appears well-developed and well-nourished. NAD. Morbidly obese.  HENT: Normocephalic and atraumatic.  Eyes: EOMI. No discharge.  Cardiovascular: RRR. No JVD. Respiratory: Effort normal and breath sounds normal.  GI: Soft. Bowel sounds are normal.  Musculoskeletal:  No edema. +TTP right shoulder. Pain with ROM right  shoulder Neurological: She is alert and oriented.  Motor: RUE: 4+/5 throughout. RLE: 4/5 throughout Skin: Skin is warm and dry. Intact.  Psychiatric: She has a normal mood and affect.      Assessment & Plan:  55 year old RH-female with history of morbid obesity, HTN, iron deficiency anemia who presents for follow up after right temporal intracranial hemorrhage.  1.S/p right temporal intracranial hemorrhage with dysesthesias  Side effects with gabapentin             Aquatic therapy denied by insurance             Cont meds  Cont Cymbalta               2. Depression               Pt states she is managing with her chaplain and scriptures  On Prozaac and duloxetine.  3. New onset seizures             Cont meds  4. Morbid obesity  Cont follow up with dietitian             Cont to encourage weight loss again  5. Muscle spasms             Pt refusing medications, but willing to try if necessary.             Encouraged ROM and stretching  Will order Robaxin 500 BID PRN  6. Abnormality of gait - post stroke             Cont cane  Therapies completed, cont HEP  7. Right shoulder pain             Xrays reviewed with some degenerative change right shoulder             Cont Voltaren gel              Will consider steroid injection in future, she still does not want at present  Improved with Voltaren gel  8. Right leg pain  See #5, #7

## 2016-10-24 MED FILL — ?CHLORTHALIDONE 25 MG TABLE: 25 | 90 days supply | Qty: 90 | Fill #2

## 2016-10-24 MED FILL — ?METOPROLOL 100 MG TABLET: 100 | 30 days supply | Qty: 60 | Fill #1

## 2016-10-24 MED FILL — METFORMIN HCL ER 500 MG TAB: 500 | 30 days supply | Qty: 30 | Fill #6

## 2016-11-08 MED FILL — ?DULOXETINE HCL DR 30 MG CA: 30 MG | 30 days supply | Qty: 30 | Fill #0

## 2016-11-08 MED FILL — ?ATORVASTATIN 20 MG TABLET: 20 | 30 days supply | Qty: 30 | Fill #2

## 2016-11-08 MED FILL — ?LEVETIRACETAM 500 MG TABLE: 500 | 30 days supply | Qty: 90 | Fill #1

## 2016-11-08 MED FILL — FLUoxetine HCL 20 MG CAPS: 20 | 30 days supply | Qty: 30 | Fill #2

## 2016-11-22 MED FILL — METFORMIN HCL ER 500 MG TAB: 500 | 30 days supply | Qty: 30 | Fill #7

## 2016-11-22 MED FILL — ?METOPROLOL 100 MG TABLET: 100 | 30 days supply | Qty: 60 | Fill #2

## 2016-12-06 MED FILL — ATORVASTATIN 20 MG TABLET: 20 | 30 days supply | Qty: 30 | Fill #3

## 2016-12-06 MED FILL — FLUoxetine HCL 20 MG CAPS: 20 | 30 days supply | Qty: 30 | Fill #3

## 2016-12-11 ENCOUNTER — Encounter: Payer: Medicaid Other | Attending: Physical Medicine & Rehabilitation | Admitting: Physical Medicine & Rehabilitation

## 2016-12-11 ENCOUNTER — Encounter: Payer: Self-pay | Admitting: Physical Medicine & Rehabilitation

## 2016-12-11 VITALS — BP 119/80 | HR 73 | Resp 14

## 2016-12-11 DIAGNOSIS — M7541 Impingement syndrome of right shoulder: Secondary | ICD-10-CM | POA: Diagnosis not present

## 2016-12-11 DIAGNOSIS — I629 Nontraumatic intracranial hemorrhage, unspecified: Secondary | ICD-10-CM | POA: Diagnosis not present

## 2016-12-11 DIAGNOSIS — R269 Unspecified abnormalities of gait and mobility: Secondary | ICD-10-CM

## 2016-12-11 DIAGNOSIS — Z5189 Encounter for other specified aftercare: Secondary | ICD-10-CM | POA: Insufficient documentation

## 2016-12-11 DIAGNOSIS — I69398 Other sequelae of cerebral infarction: Secondary | ICD-10-CM

## 2016-12-11 DIAGNOSIS — F329 Major depressive disorder, single episode, unspecified: Secondary | ICD-10-CM

## 2016-12-11 DIAGNOSIS — R569 Unspecified convulsions: Secondary | ICD-10-CM | POA: Diagnosis not present

## 2016-12-11 DIAGNOSIS — M25511 Pain in right shoulder: Secondary | ICD-10-CM

## 2016-12-11 DIAGNOSIS — I10 Essential (primary) hypertension: Secondary | ICD-10-CM | POA: Insufficient documentation

## 2016-12-11 DIAGNOSIS — G8929 Other chronic pain: Secondary | ICD-10-CM | POA: Diagnosis not present

## 2016-12-11 DIAGNOSIS — I69249 Monoplegia of lower limb following other nontraumatic intracranial hemorrhage affecting unspecified side: Secondary | ICD-10-CM | POA: Insufficient documentation

## 2016-12-11 DIAGNOSIS — M62838 Other muscle spasm: Secondary | ICD-10-CM

## 2016-12-11 DIAGNOSIS — R208 Other disturbances of skin sensation: Secondary | ICD-10-CM

## 2016-12-11 DIAGNOSIS — R7303 Prediabetes: Secondary | ICD-10-CM | POA: Diagnosis not present

## 2016-12-11 MED ORDER — METHOCARBAMOL 750 MG PO TABS: 750.0000 mg | ORAL_TABLET | Freq: Two times a day (BID) | ORAL | 1 refills | Status: DC | PRN

## 2016-12-11 NOTE — Progress Notes (Signed)
Subjective:    Patient ID: [name], female    DOB: 1960-11-05, 56 y.o.   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  Last clinic visit 09/12/16.  Since last visit, she states her weight is the same.  She is doing HEP, but hasn't been doing them because she is lazy. She states she never obtained Robaxin. She rolled out of the bed at night and fell. Right shoulder hurts depending on weather.   Pain Inventory Average Pain  6 Pain Right Now 5 My pain is sharp, dull, tingling and aching  In the last 24 hours, has pain interfered with the following? General activity 3 Relation with others 4 Enjoyment of life 3 What TIME of day is your pain at its worst? night Sleep (in general) Fair  Pain is worse with: inactivity and some activites Pain improves with: heat/ice and medication Relief from Meds: 5  Mobility walk with assistance use a cane ability to climb steps?  no do you drive?  yes transfers alone  Function not employed: date last employed . disabled: date disabled .  Neuro/Psych No problems in this area  Prior Studies Any changes since last visit?  no  Physicians involved in your care Any changes since last visit?  no   Family History  Problem Relation Age of Onset  . Diabetes Father   . Hypertension Father   . Hypertension Sister   . Stroke Brother    Social History   Social History  . Marital status: Married    Spouse name: N/A  . Number of children: N/A  . Years of education: N/A   Social History Main Topics  . Smoking status: Never Smoker  . Smokeless tobacco: Never Used  . Alcohol use No  . Drug use: No  . Sexual activity: Not Asked   Other Topics Concern  . None   Social History Narrative  . None   Past Surgical History:  Procedure Laterality Date  . CARDIAC CATHETERIZATION  04/2010   Dr. Terrence Dupont    Past Medical History:  Diagnosis Date  . Headache   . Hypertension   . Iron deficiency anemia   . Seizures ([place])   . Stroke ([place])    BP 119/80 (BP Location: Left Wrist, Patient Position: Sitting, Cuff Size: Normal)   Pulse 73   Resp 14   LMP 02/17/2011   SpO2 95%   Opioid Risk Score:   Fall Risk Score:  `1  Depression screen PHQ 2/9  Depression screen [facility] 2/9 10/10/2016 09/10/2016 06/13/2016 05/22/2016 04/22/2016 02/20/2016 11/07/2015  Decreased Interest 1 2 3 1 1 1  (No Data)  Down, Depressed, Hopeless 1 1 1 2 2 1  (No Data)  PHQ - 2 Score 2 3 4 3 3 2  -  Altered sleeping - 2 - 0 2 2 2   Tired, decreased energy - 1 - 2 1 1 3   Change in appetite - 1 - 2 1 2 2   Feeling bad or failure about yourself  - 2 - 0 2 1 0  Trouble concentrating - 1 - 0 2 0 2  Moving slowly or fidgety/restless - 2 - 1 0 0 0  Suicidal thoughts - 0 - 0 0 0 0  PHQ-9 Score - 12 - 8 11 8  -  Difficult doing work/chores - - - Very difficult - - -  Review of Systems  Constitutional:       Bladder control problems   HENT: Negative.   Eyes: Negative.   Respiratory: Negative.   Cardiovascular: Negative.   Gastrointestinal: Negative.   Endocrine: Negative.   Genitourinary: Negative.   Musculoskeletal: Positive for arthralgias and gait problem.  Skin: Negative.   Neurological: Positive for weakness.       Tingling Spasms    Hematological: Negative.   Psychiatric/Behavioral: Negative.   All other systems reviewed and are negative.     Objective:   Physical Exam Constitutional: She appears well-developed and well-nourished. NAD. Morbidly obese.  HENT: Normocephalic and atraumatic.  Eyes: EOMI. No discharge.  Cardiovascular: RRR. No JVD. Respiratory: Effort normal and breath sounds normal.  GI: Soft. Bowel sounds are normal.  Musculoskeletal:  No edema. +TTP right shoulder. Pain with ROM right shoulder Neurological: She is alert and oriented.  Motor: RUE: 4+/5 throughout. RLE: 4/5  throughout Skin: Skin is warm and dry. Intact.  Psychiatric: She has a normal mood and affect.      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  1.S/p right temporal intracranial hemorrhage with dysesthesias  Side effects with gabapentin             Aquatic therapy denied by insurance, encouraged follow up at [facility]               2. Depression               Pt states she is managing with her chaplain and scriptures  On Prozaac and duloxetine.  3. Seizures             Cont meds  4. Morbid obesity  Cont follow up with dietitian             Cont to encourage weight loss again  5. Muscle spasms             Pt refusing medications, but willing to try if necessary.             Encouraged ROM and stretching  Will order Robaxin 750 BID PRN, states she never received 500mg   6. Abnormality of gait - post stroke             Cont cane  Therapies completed, cont HEP  7. Right shoulder pain             Xrays reviewed previously with some degenerative change right shoulder             Cont Voltaren gel   Will consider steroid injection in future, she still does not want at present  Improved with Voltaren gel  8. Right leg pain  See #5, #7

## 2016-12-12 ENCOUNTER — Telehealth: Payer: Self-pay | Admitting: Physical Medicine & Rehabilitation

## 2016-12-12 MED ORDER — METHOCARBAMOL 750 MG PO TABS: 750.0000 mg | ORAL_TABLET | Freq: Two times a day (BID) | ORAL | 1 refills | Status: DC | PRN

## 2016-12-12 MED FILL — METHOCARBAMOL 750 MG TABLET: 750 | 30 days supply | Qty: 60 | Fill #0

## 2016-12-12 NOTE — Telephone Encounter (Signed)
Medication reordered and sent to correct location, called and informed patient

## 2016-12-12 NOTE — Telephone Encounter (Signed)
Robaxin was sent to wrong pharmacy.  Please send it to [facility].

## 2016-12-13 MED FILL — levETIRAcetam 500 MG TABS: 500 | 30 days supply | Qty: 90 | Fill #2

## 2016-12-23 MED FILL — METFORMIN HCL ER 500 MG TAB: 500 | 30 days supply | Qty: 30 | Fill #8

## 2016-12-23 MED FILL — ?CHLORTHALIDONE 25 MG TABLE: 25 | 30 days supply | Qty: 30 | Fill #3

## 2016-12-31 MED FILL — ?METOPROLOL 100 MG TABLET: 100 | 30 days supply | Qty: 60 | Fill #3

## 2017-01-07 MED FILL — FLUoxetine HCL 20 MG CAPS: 20 | 30 days supply | Qty: 30 | Fill #4

## 2017-01-27 ENCOUNTER — Telehealth: Payer: Self-pay | Admitting: *Deleted

## 2017-01-27 MED ORDER — DULOXETINE HCL 60 MG PO CPEP: 60.0000 mg | ORAL_CAPSULE | Freq: Every day | ORAL | 1 refills | Status: DC

## 2017-01-27 MED FILL — levETIRAcetam 500 MG TABS: 500 | 30 days supply | Qty: 90 | Fill #3

## 2017-01-27 MED FILL — ?CHLORTHALIDONE 25 MG TABLE: 25 | 30 days supply | Qty: 30 | Fill #4

## 2017-01-27 MED FILL — ?DULOXETINE HCL DR 60 MG CA: 60 MG | 30 days supply | Qty: 30 | Fill #0

## 2017-01-27 MED FILL — METFORMIN HCL ER 500 MG TAB: 500 | 30 days supply | Qty: 30 | Fill #9

## 2017-01-27 NOTE — Telephone Encounter (Signed)
[facility] is trying to request a refill on [name] Cymbalta 30 mg daily.  It appears at her last visit you ordered Cymbalta 60 mg daily with 1 RF on 10/10/16. That order went to [facility].  Originally she was prescribed 30 mg and it was sent to [facility] on 09/12/16.  Before I send a new refill in, can you clarify the dose you want her to be on?  Her next appt is 02/07/17

## 2017-01-27 NOTE — Telephone Encounter (Signed)
We may refill the prescription for 60mg .  Thanks.

## 2017-01-27 NOTE — Telephone Encounter (Signed)
done

## 2017-02-03 MED FILL — ?METOPROLOL 100 MG TABLET: 100 | 30 days supply | Qty: 60 | Fill #4

## 2017-02-07 ENCOUNTER — Encounter: Payer: Medicaid Other | Attending: Physical Medicine & Rehabilitation | Admitting: Physical Medicine & Rehabilitation

## 2017-02-07 DIAGNOSIS — I629 Nontraumatic intracranial hemorrhage, unspecified: Secondary | ICD-10-CM | POA: Insufficient documentation

## 2017-02-07 DIAGNOSIS — F329 Major depressive disorder, single episode, unspecified: Secondary | ICD-10-CM | POA: Insufficient documentation

## 2017-02-07 DIAGNOSIS — Z5189 Encounter for other specified aftercare: Secondary | ICD-10-CM | POA: Insufficient documentation

## 2017-02-07 DIAGNOSIS — I69249 Monoplegia of lower limb following other nontraumatic intracranial hemorrhage affecting unspecified side: Secondary | ICD-10-CM | POA: Insufficient documentation

## 2017-02-07 DIAGNOSIS — M62838 Other muscle spasm: Secondary | ICD-10-CM | POA: Insufficient documentation

## 2017-02-07 DIAGNOSIS — I10 Essential (primary) hypertension: Secondary | ICD-10-CM | POA: Insufficient documentation

## 2017-02-07 DIAGNOSIS — R569 Unspecified convulsions: Secondary | ICD-10-CM | POA: Insufficient documentation

## 2017-02-07 DIAGNOSIS — R7303 Prediabetes: Secondary | ICD-10-CM | POA: Insufficient documentation

## 2017-02-07 DIAGNOSIS — R269 Unspecified abnormalities of gait and mobility: Secondary | ICD-10-CM | POA: Insufficient documentation

## 2017-02-10 ENCOUNTER — Ambulatory Visit: Payer: Medicaid Other | Admitting: Physical Medicine & Rehabilitation

## 2017-02-17 MED FILL — FLUoxetine HCL 20 MG CAPS: 20 | 30 days supply | Qty: 30 | Fill #5

## 2017-02-17 MED FILL — ATORVASTATIN 20 MG TABLET: 20 | 30 days supply | Qty: 30 | Fill #4

## 2017-02-25 MED FILL — CHLORTHALIDONE 25 MG TABLET: 25 | 30 days supply | Qty: 30 | Fill #5

## 2017-02-25 MED FILL — levETIRAcetam 500 MG TABS: 500 | 30 days supply | Qty: 90 | Fill #4

## 2017-02-25 MED FILL — DULoxetine HCL 60 MG CPEP: 60 | 30 days supply | Qty: 30 | Fill #1

## 2017-02-25 MED FILL — METFORMIN HCL ER 500 MG TAB: 500 | 30 days supply | Qty: 30 | Fill #10

## 2017-02-25 MED FILL — METHOCARBAMOL 750 MG TABLET: 750 | 30 days supply | Qty: 60 | Fill #1

## 2017-02-27 ENCOUNTER — Encounter: Payer: Self-pay | Admitting: Physical Medicine & Rehabilitation

## 2017-02-27 ENCOUNTER — Other Ambulatory Visit: Payer: Self-pay

## 2017-02-27 ENCOUNTER — Encounter: Payer: Medicaid Other | Attending: Physical Medicine & Rehabilitation | Admitting: Physical Medicine & Rehabilitation

## 2017-02-27 VITALS — BP 127/87 | HR 63

## 2017-02-27 DIAGNOSIS — M25511 Pain in right shoulder: Secondary | ICD-10-CM

## 2017-02-27 DIAGNOSIS — R269 Unspecified abnormalities of gait and mobility: Secondary | ICD-10-CM | POA: Diagnosis not present

## 2017-02-27 DIAGNOSIS — Z5189 Encounter for other specified aftercare: Secondary | ICD-10-CM | POA: Diagnosis present

## 2017-02-27 DIAGNOSIS — M79604 Pain in right leg: Secondary | ICD-10-CM | POA: Diagnosis not present

## 2017-02-27 DIAGNOSIS — R7303 Prediabetes: Secondary | ICD-10-CM | POA: Diagnosis not present

## 2017-02-27 DIAGNOSIS — I69398 Other sequelae of cerebral infarction: Secondary | ICD-10-CM

## 2017-02-27 DIAGNOSIS — I69249 Monoplegia of lower limb following other nontraumatic intracranial hemorrhage affecting unspecified side: Secondary | ICD-10-CM | POA: Diagnosis not present

## 2017-02-27 DIAGNOSIS — M62838 Other muscle spasm: Secondary | ICD-10-CM

## 2017-02-27 DIAGNOSIS — I629 Nontraumatic intracranial hemorrhage, unspecified: Secondary | ICD-10-CM | POA: Insufficient documentation

## 2017-02-27 DIAGNOSIS — F329 Major depressive disorder, single episode, unspecified: Secondary | ICD-10-CM

## 2017-02-27 DIAGNOSIS — I611 Nontraumatic intracerebral hemorrhage in hemisphere, cortical: Secondary | ICD-10-CM

## 2017-02-27 DIAGNOSIS — R208 Other disturbances of skin sensation: Secondary | ICD-10-CM

## 2017-02-27 DIAGNOSIS — R569 Unspecified convulsions: Secondary | ICD-10-CM | POA: Insufficient documentation

## 2017-02-27 DIAGNOSIS — M7541 Impingement syndrome of right shoulder: Secondary | ICD-10-CM

## 2017-02-27 DIAGNOSIS — G8929 Other chronic pain: Secondary | ICD-10-CM

## 2017-02-27 DIAGNOSIS — I10 Essential (primary) hypertension: Secondary | ICD-10-CM | POA: Diagnosis not present

## 2017-02-27 NOTE — Progress Notes (Signed)
Subjective:    Patient ID: [name], female    DOB: 10/18/1960, 56 y.o.   MRN: [id number]  HPI  56 year old RH-female with history of morbid obesity, HTN, iron deficiency anemia who presents for follow up after right temporal intracranial hemorrhage.  Last clinic visit 12/11/16.  Since that time, pt states she still has not followed up with [facility].  She continues to take medications.  She states her weight loss has been up/down, but no real progress. She had good benefit with Robaxin. She continues to use her cane.  She had a fall in her sleep.   Pain Inventory Average Pain  6 Pain Right Now 5 My pain is sharp, dull and aching  In the last 24 hours, has pain interfered with the following? General activity 4 Relation with others 0 Enjoyment of life 0 What TIME of day is your pain at its worst? evening and night Sleep (in general) Fair  Pain is worse with: . Pain improves with: . Relief from Meds: 0  Mobility walk with assistance use a cane ability to climb steps?  no do you drive?  yes transfers alone Do you have any goals in this area?  yes  Function not employed: date last employed . disabled: date disabled .  Neuro/Psych numbness tingling spasms  Prior Studies Any changes since last visit?  no  Physicians involved in your care Any changes since last visit?  no   Family History  Problem Relation Age of Onset  . Diabetes Father   . Hypertension Father   . Hypertension Sister   . Stroke Brother    Social History   Socioeconomic History  . Marital status: Married    Spouse name: None  . Number of children: None  . Years of education: None  . Highest education level: None  Social Needs  . Financial resource strain: None  . Food insecurity - worry: None  . Food insecurity - inability: None  . Transportation needs - medical: None  . Transportation needs - non-medical: None  Occupational History  . None  Tobacco Use  . Smoking status: Never  Smoker  . Smokeless tobacco: Never Used  Substance and Sexual Activity  . Alcohol use: No  . Drug use: No  . Sexual activity: None  Other Topics Concern  . None  Social History Narrative  . None   Past Surgical History:  Procedure Laterality Date  . CARDIAC CATHETERIZATION  04/2010   Dr. Terrence Dupont   Past Medical History:  Diagnosis Date  . Headache   . Hypertension   . Iron deficiency anemia   . Seizures ([place])   . Stroke ([place])    BP 127/87   Pulse 63   LMP 02/17/2011   SpO2 97%   Opioid Risk Score:   Fall Risk Score:  `1  Depression screen PHQ 2/9  Depression screen [facility] 2/9 02/27/2017 10/10/2016 09/10/2016 06/13/2016 05/22/2016 04/22/2016 02/20/2016  Decreased Interest 1 1 2 3 1 1 1   Down, Depressed, Hopeless 1 1 1 1 2 2 1   PHQ - 2 Score 2 2 3 4 3 3 2   Altered sleeping - - 2 - 0 2 2  Tired, decreased energy - - 1 - 2 1 1   Change in appetite - - 1 - 2 1 2   Feeling bad or failure about yourself  - - 2 - 0 2 1  Trouble concentrating - - 1 - 0 2 0  Moving slowly or  fidgety/restless - - 2 - 1 0 0  Suicidal thoughts - - 0 - 0 0 0  PHQ-9 Score - - 12 - 8 11 8   Difficult doing work/chores - - - - Very difficult - -    Review of Systems  Constitutional:       Bladder control problems   HENT: Negative.   Eyes: Negative.   Respiratory: Negative.   Cardiovascular: Negative.   Gastrointestinal: Negative.   Endocrine: Negative.   Genitourinary: Negative.   Musculoskeletal: Positive for arthralgias and gait problem.  Skin: Negative.   Neurological: Positive for weakness.       Tingling Spasms    Hematological: Negative.   Psychiatric/Behavioral: Negative.   All other systems reviewed and are negative.     Objective:   Physical Exam Constitutional: She appears well-developed and well-nourished. NAD. Morbidly obese.  HENT: Normocephalic and atraumatic.  Eyes: EOMI. No discharge.  Cardiovascular: RRR. No JVD. Respiratory: Effort normal and breath sounds normal.    GI: Soft. Bowel sounds are normal.  Musculoskeletal:  No edema. +TTP right shoulder. Pain with ROM right shoulder Neurological: She is alert and oriented.  Motor: RUE: 4+/5 throughout. RLE: 4/5 throughout Skin: Skin is warm and dry. Intact.  Psychiatric: She has a normal mood and affect.      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  1.S/p right temporal intracranial hemorrhage with dysesthesias  Side effects with gabapentin             Aquatic therapy denied by insurance, encouraged follow up at [facility] again  Cont meds  Cont Cymbalta               2. Depression               Pt states she is managing with her chaplain and scriptures  On Prozaac and duloxetine.  3. Seizures             Cont meds  4. Morbid obesity  Cont follow up with dietitian             Cont to encourage weight loss again  5. Muscle spasms             Encouraged ROM and stretching  Cont Robaxin 750 BID PRN  6. Abnormality of gait - post stroke             Cont cane  Therapies completed, cont HEP  7. Right shoulder pain             Xrays reviewed previously with some degenerative change right shoulder             Cont Voltaren gel   Will consider steroid injection in future, she still does not want at present  Improved with Voltaren gel  8. Right leg pain  See #5, #7  9. Falls  In sleep  Encouraged pt to push bed against wall or place furniture against bed to limit chances of rolling onto floow

## 2017-03-11 MED FILL — ATORVASTATIN 20 MG TABLET: 20 | 30 days supply | Qty: 30 | Fill #5

## 2017-03-11 MED FILL — METOPROLOL TARTRATE 100 MG: 100 | 30 days supply | Qty: 60 | Fill #5

## 2017-03-26 ENCOUNTER — Other Ambulatory Visit: Payer: Self-pay | Admitting: Family Medicine

## 2017-03-26 ENCOUNTER — Other Ambulatory Visit: Payer: Self-pay | Admitting: Physical Medicine & Rehabilitation

## 2017-03-26 DIAGNOSIS — F4321 Adjustment disorder with depressed mood: Secondary | ICD-10-CM

## 2017-03-26 MED FILL — DULoxetine HCL 60 MG CPEP: 60 | 30 days supply | Qty: 30 | Fill #0

## 2017-03-28 MED FILL — FLUoxetine HCL 20 MG CAPS: 20 | 30 days supply | Qty: 30 | Fill #0

## 2017-04-01 MED FILL — levETIRAcetam 500 MG TABS: 500 | 30 days supply | Qty: 90 | Fill #5

## 2017-04-01 MED FILL — METFORMIN HCL ER 500 MG TAB: 500 | 30 days supply | Qty: 30 | Fill #11

## 2017-04-01 NOTE — Progress Notes (Signed)
[name]  PATIENT: [name] DOB: October 18, 1960   REASON FOR VISIT: follow up for stroke HISTORY FROM:patient    HISTORY OF PRESENT ILLNESS:[name] is a 57 y.o. female with history of morbid obesity and hypertension admitted on 10/06/15 for LOC and GTC seizure. CT showed right temporal small ICH, and repeat CT stable hematoma. CTA and CTV head without AVM or CVT. CUS, DVT screening and TTE unremarkable. LTM  EEG no seizure. LD 58 and A1C 5.7. She was put on keppra 750mg  bid. She was discharged to CIR with planning outpt sleep study.   11/29/15 follow up Dr. Erlinda Hong- the patient has been doing well. No seizure activity. She is not driving so far. However, she complains of right shoulder pain with limited ROM at right UE. Walks with cane at home. BP today 137/78 and also stable at home. BP controlled well with 2 BP meds.  Interval HistoryXU During the interval time, pt has been doing well. Had MRI and MRA in 11/2015 showed resolved bleeding and no AVM or aneurysm. He followed with [name] for right shoulder pain and was put on Voltaren gel which helped. She continued on keppra and no more seizures. She complains of turning head back from right lateral position fast causing dizzy, but it turning slow then no dizziness. Currently sleep study on hold due to no insurance. Still has outpt PT. BP today 132/78. UPDATE 07/11/2018[name], 57 year old female returns for follow-upwith a history of right temporal small ICH. Repeat CT stable and restarted on aspirin at her last visit.she has not had further stroke or TIA symptoms. She has minimal bruising or bleeding. She remains on Keppra for seizure disorder with no further seizure activity. She is back to driving without difficulty. She remains on Lipitor without complaints of myalgias. She is morbidly obese and needs a sleep study however insurance will not pay for this.She continues to see [name] for her right shoulder  pain. She states that she is doing her home exercise program She returns for reevaluation without further neurologic symptoms UPDATE 1/9/2019[name], 57 year old female returns for follow-up degree of right temporal small ICH.  She is back on aspirin without further stroke or TIA symptoms.  No bleeding and no bruising.  She is also on Keppra for seizure disorder without side effects or recurrent seizures.  She remains on Lipitor stroke prevention without myalgias.  She is morbidly obese.  She now has Medicaid and can get her sleep study.  Has a lot of daytime drowsiness.She complains of intermittent headache.  She takes Tylenol.  She continues to see [name] for right shoulder pain.  She uses Voltaren gel and Robaxin with fairly good pain relief.  She ambulates with a single-point cane she returns for reevaluation REVIEW OF SYSTEMS: Full 14 system review of systems performed and notable only for those listed, all others are neg:  Constitutional: Morbidly obese Cardiovascular: leg swelling Ear/Nose/Throat: neg  Skin: neg Eyes: blurred vision Respiratory: neg Gastroitestinal: neg  Hematology/Lymphatic: neg  Endocrine: neg Musculoskeletal: Shoulder pain, walking difficulty Allergy/Immunology: Food allergies Neurological: occasional headache Psychiatric: neg Sleep : Daytime sleepiness   ALLERGIES: Allergies  Allergen Reactions  . Eggs Or Egg-Derived Products   . Gabapentin     Jittery feeling, dyspnea, crawling sensation  . Gadolinium Derivatives Itching    Immediately after gad injection pt. complained of tongue numbness and lower lip/ then had rt side of body tingling/ pt. Was assessed by  Dr. Jeralyn Ruths before leaving/ no medications were administered/    HOME MEDICATIONS: Outpatient Medications Prior to Visit  Medication Sig Dispense Refill  . aspirin EC 81 MG tablet Take 1 tablet (81 mg total) by mouth daily.    Marland Kitchen atorvastatin (LIPITOR) 20 MG tablet Take 1 tablet (20 mg total) by  mouth daily. 30 tablet 5  . chlorthalidone (HYGROTON) 25 MG tablet Take 1 tablet (25 mg total) by mouth daily. 90 tablet 3  . diclofenac sodium (VOLTAREN) 1 % GEL Apply 2 g topically 4 (four) times daily. 1 Tube 3  . DULoxetine (CYMBALTA) 60 MG capsule TAKE 1 CAPSULE (60 MG TOTAL) DAILY BY MOUTH. 30 capsule 1  . FLUoxetine (PROZAC) 20 MG capsule TAKE 1 CAPSULE BY MOUTH DAILY. 30 capsule 5  . fluticasone (FLONASE) 50 MCG/ACT nasal spray Place 1 spray into both nostrils daily as needed for allergies or rhinitis.    . hydroxypropyl methylcellulose / hypromellose (ISOPTO TEARS / GONIOVISC) 2.5 % ophthalmic solution Place 2 drops into both eyes as needed (irritation). 15 mL 12  . levETIRAcetam (KEPPRA) 500 MG tablet TAKE 1 & 1/2 TABLETS BY MOUTH TWICE DAILY. 90 tablet 6  . metFORMIN (GLUCOPHAGE XR) 500 MG 24 hr tablet Take 1 tablet (500 mg total) by mouth daily with breakfast. 90 tablet 3  . methocarbamol (ROBAXIN-750) 750 MG tablet Take 1 tablet (750 mg total) by mouth 2 (two) times daily as needed for muscle spasms. 60 tablet 1  . metoprolol tartrate (LOPRESSOR) 100 MG tablet Take 1 tablet (100 mg total) by mouth 2 (two) times daily. 60 tablet 5   No facility-administered medications prior to visit.     PAST MEDICAL HISTORY: Past Medical History:  Diagnosis Date  . Headache   . Hypertension   . Iron deficiency anemia   . Seizures ([place])   . Stroke [facility])     PAST SURGICAL HISTORY: Past Surgical History:  Procedure Laterality Date  . CARDIAC CATHETERIZATION  04/2010   Dr. Terrence Dupont    FAMILY HISTORY: Family History  Problem Relation Age of Onset  . Diabetes Father   . Hypertension Father   . Hypertension Sister   . Stroke Brother     SOCIAL HISTORY: Social History   Socioeconomic History  . Marital status: Married    Spouse name: Not on file  . Number of children: Not on file  . Years of education: Not on file  . Highest education level: Not on file  Social Needs  .  Financial resource strain: Not on file  . Food insecurity - worry: Not on file  . Food insecurity - inability: Not on file  . Transportation needs - medical: Not on file  . Transportation needs - non-medical: Not on file  Occupational History  . Not on file  Tobacco Use  . Smoking status: Never Smoker  . Smokeless tobacco: Never Used  Substance and Sexual Activity  . Alcohol use: No  . Drug use: No  . Sexual activity: Not on file  Other Topics Concern  . Not on file  Social History Narrative  . Not on file     PHYSICAL EXAM  Vitals:   04/02/17 1108  BP: (!) 147/80  Pulse: 80  Weight: 280 lb (127 kg)   Body mass index is 56.55 kg/m.  Generalized: Well developed, morbidly obese femalein no acute distress  Head: normocephalic and atraumatic,. Oropharynx benign  Neck: Supple, no carotid bruits  Cardiac: Regular rate rhythm, no murmur  Musculoskeletal: No deformity   Neurological examination   Mentation: Alert oriented to time, place, history taking. Attention span and concentration appropriate. Recent and remote memory intact.  Follows all commands speech and language fluent.   Cranial nerve II-XII: Pupils were equal round reactive to light extraocular movements were full, visual field were full on confrontational test. Facial sensation and strength were normal. hearing was intact to finger rubbing bilaterally. Uvula tongue midline. head turning and shoulder shrug were normal and symmetric.Tongue protrusion into cheek strength was normal. Motor: normal bulk and tone, full strength in the BUE, BLE, except right upper extremity 4+ due to shoulder pain Sensory: normal and symmetric to light touch,  Coordination: finger-nose-finger, heel-to-shin bilaterally, no dysmetria Reflexes: 1+ upper lower and symmetric, plantar responses were flexor bilaterally. Gait and Station: Rising up from seated position without assistance,slow steady broad-based gait with cane DIAGNOSTIC DATA  (LABS, IMAGING, TESTING) - I reviewed patient records, labs, notes, testing and imaging myself where available.  Lab Results  Component Value Date   WBC 8.4 09/10/2016   HGB 11.5 09/10/2016   HCT 36.4 09/10/2016   MCV 77 (L) 09/10/2016   PLT 341 09/10/2016      Component Value Date/Time   NA 144 09/10/2016 1109   K 3.7 09/10/2016 1109   CL 99 09/10/2016 1109   CO2 28 09/10/2016 1109   GLUCOSE 123 (H) 09/10/2016 1109   GLUCOSE 113 (H) 02/20/2016 0953   BUN 22 09/10/2016 1109   CREATININE 1.07 (H) 09/10/2016 1109   CREATININE 0.86 02/20/2016 0953   CALCIUM 9.7 09/10/2016 1109   PROT 7.9 09/10/2016 1109   ALBUMIN 3.8 09/10/2016 1109   AST 14 09/10/2016 1109   ALT 12 09/10/2016 1109   ALKPHOS 71 09/10/2016 1109   BILITOT 0.3 09/10/2016 1109   GFRNONAA 59 (L) 09/10/2016 1109   GFRNONAA 76 02/20/2016 0953   GFRAA 68 09/10/2016 1109   GFRAA 88 02/20/2016 0953   Lab Results  Component Value Date   CHOL 112 10/08/2015   HDL 42 10/08/2015   LDLCALC 58 10/08/2015   TRIG 62 10/08/2015   CHOLHDL 2.7 10/08/2015   Lab Results  Component Value Date   HGBA1C 5.8 02/20/2016      ASSESSMENT AND PLAN 57 y.o. African American female with PMH of morbid obesity and hypertension admitted on 10/06/15 for LOC and GTC seizure. CT showed right temporal small ICH, and repeat CT stable hematoma. CTA and CTV head without AVM or CVT. CUS, DVT screening and TTE unremarkable. LTM  EEG no seizure. LD 58 and A1C 5.7. She was put on keppra 750mg  bid. She was discharged to CIR with planning outpt sleep study. During the interval time, the patient has no seizure activity. Complains of right shoulder pain with limited ROM at right UE.  Repeat MRI and MRA showed resolved bleeding and no underlying vascular abnormalities. Now on  baby ASA. Sleep study will be ordered patient now has insurance  Continue follow up with [name] for shoulder pain.    Continue ASA 81mg  daily for stroke prevention Continue  follow up with Dr. Lottie Rater for rehab and shoulder pain Patient now has Medicaid will order sleep study to rule out sleep apnea   Follow up with your primary care physician for stroke risk factor modification. Recommend maintain blood pressure goal <130/80, diabetes with hemoglobin A1c goal below 7.0% and lipids with LDL cholesterol goal below 70 mg/dL. Continue Lipitor for hyperlipidemia and stroke prevention Continue home exercise program  Continue the keppra for seizure control will refill Call for seizure activity -Please maintain seizure precautions.  -follow up in 8 months I spent 25 minutes in total face to face time with the patient more than 50% of which was spent counseling and coordination of care, reviewing test results reviewing medications and discussing and reviewing the diagnosis of stroke and management of risk factors along with seizure disorder and common seizure triggers.  Due to her morbid obesity patient probably has obstructive sleep apnea and sleep study has been ordered  Dennie Bible, [facility], [facility], [facility] [address] [place], [place] [address] [phone]

## 2017-04-02 ENCOUNTER — Encounter: Payer: Self-pay | Admitting: Nurse Practitioner

## 2017-04-02 ENCOUNTER — Ambulatory Visit: Payer: Medicaid Other | Admitting: Nurse Practitioner

## 2017-04-02 VITALS — BP 147/80 | HR 80 | Wt 280.0 lb

## 2017-04-02 DIAGNOSIS — G40909 Epilepsy, unspecified, not intractable, without status epilepticus: Secondary | ICD-10-CM

## 2017-04-02 DIAGNOSIS — I10 Essential (primary) hypertension: Secondary | ICD-10-CM

## 2017-04-02 DIAGNOSIS — I611 Nontraumatic intracerebral hemorrhage in hemisphere, cortical: Secondary | ICD-10-CM

## 2017-04-02 DIAGNOSIS — R51 Headache: Secondary | ICD-10-CM | POA: Diagnosis not present

## 2017-04-02 DIAGNOSIS — E669 Obesity, unspecified: Secondary | ICD-10-CM

## 2017-04-02 DIAGNOSIS — R519 Headache, unspecified: Secondary | ICD-10-CM | POA: Insufficient documentation

## 2017-04-02 MED ORDER — LEVETIRACETAM 500 MG PO TABS: ORAL_TABLET | ORAL | 8 refills | Status: DC

## 2017-04-02 NOTE — Patient Instructions (Signed)
Continue ASA 81mg  daily for stroke prevention Continue follow up with Dr. Lottie Rater for rehab and shoulder pain Patient now has Medicaid will order sleep study to rule out sleep apnea   Follow up with your primary care physician for stroke risk factor modification. Recommend maintain blood pressure goal <130/80, diabetes with hemoglobin A1c goal below 7.0% and lipids with LDL cholesterol goal below 70 mg/dL. Continue Lipitor for stroke prevention Continue home exercise program  Continue the keppra for seizure control will refill Call for seizure activity -Please maintain seizure precautions.  -follow up in 8 months

## 2017-04-03 NOTE — Progress Notes (Signed)
I reviewed above note and agree with the assessment and plan.   Rosalin Hawking, MD PhD Stroke Neurology 04/03/2017 10:54 AM

## 2017-04-10 ENCOUNTER — Encounter: Payer: Self-pay | Admitting: Occupational Therapy

## 2017-04-10 DIAGNOSIS — I69253 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 non-dominant side: Secondary | ICD-10-CM

## 2017-04-10 NOTE — Therapy (Signed)
[facility] [address] [place], [place], [address] Phone: [phone]   Fax:  [phone]  Occupational Therapy Treatment  Patient Details  Name: [name] MRN: [id number] Date of Birth: 18-Jun-1960 No Data Recorded  Encounter Date: 04/10/2017    Past Medical History:  Diagnosis Date  . Headache   . Hypertension   . Iron deficiency anemia   . Seizures ([place])   . Stroke [facility])     Past Surgical History:  Procedure Laterality Date  . CARDIAC CATHETERIZATION  04/2010   Dr. Terrence Dupont    There were no vitals filed for this visit.                          OT Short Term Goals - 04/10/17 1628      OT SHORT TERM GOAL #1   Title  I with inital HEP. due 02/17/16    Baseline  dependent    Time  4    Period  Weeks    Status  Achieved      OT SHORT TERM GOAL #2   Title  Pt will verbalize understanding of RUE positioning to minimize pain and risk for injury.    Baseline  dependent    Time  4    Period  Weeks    Status  Achieved      OT SHORT TERM GOAL #3   Title  Pt will verbalize understanding of compensatory strategies for short term memory deficits.    Baseline  dependent    Time  4    Period  Weeks    Status  Achieved      OT SHORT TERM GOAL #4   Title  Pt will demonstrate 90 shoulder flexion with pain less than or equal to 4/10 for functional reach.    Baseline  70*    Time  4    Period  Weeks    Status  Unable to assess      OT SHORT TERM GOAL #5   Title  Pt will perform dressing modified independently.    Baseline  min A    Time  4    Period  Weeks    Status  Achieved        OT Long Term Goals - 04/10/17 1628      OT LONG TERM GOAL #1   Title  Pt will perform mod complex cooking/ home management at a modified indpendent level demonstrating good safety awareness. due 03/17/16    Baseline  performing light home management and basic cooking with assistance    Time  8     Period  Weeks    Status  Unable to assess      OT LONG TERM GOAL #2   Title  Pt will demonstrate ability to perform tabletop and environmental scanning tasks with 95% better accuracy and no significant diplopia.    Baseline  Pt reports difficulty reading due to significant diplopia, pt does not perform effective environmental scanning due to c/o of diplopia    Time  8    Period  Weeks    Status  Unable to assess 100% accuracy with increased time and used finger as guide      OT LONG TERM GOAL #3   Title  Pt will resume use of RUE as a non dominant assist with ADLs/ IADLs at least 50% of the time with pain no greater than 3/10  Baseline  unable to use RUE consistently  due to severe pain    Time  8    Period  Weeks    Status  Unable to assess per pt report      OT LONG TERM GOAL #4   Title  Pt will demonstrate improved fine motor coordiantion as evidenced by decreasing RUE 9 hole peg test score by 4 secs from eval.    Baseline  to be assessed    Time  8    Period  Weeks    Status  Achieved 24.55      OT LONG TERM GOAL #5   Title  Pt will demonstrate ability to perform  a physical and cognitive task simultaneously with 90% or better accuracy.    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 Headache 04/02/2017  . Seizure disorder ([place]) 10/02/2016  . Obstructive sleep apnea 11/29/2015  . Right shoulder pain 11/29/2015  . Adjustment disorder with depressed  mood 10/19/2015  . Nocturnal enuresis   . Hypokalemia   . Anemia, iron deficiency   . Super obese   . Prediabetes   . Orthostatic hypotension   . Supplemental oxygen dependent   . Diastolic dysfunction   . Tachypnea   . AKI (acute kidney injury) ([place])   . Acute blood loss anemia   . Lethargy   . Wheezing   . Benign essential HTN   . ICH (intracerebral hemorrhage) ([place]) 10/06/2015   OCCUPATIONAL THERAPY DISCHARGE SUMMARY  Visits from Start of Care: 8  Current functional level related to goals / functional outcomes: See above   Remaining deficits: See above pt did not return to therapy   Education / Equipment: HEP Plan: Patient agrees to discharge.  Patient goals were partially met. Patient is being discharged due to meeting the stated rehab goals.  ?????      Quay Burow, OTR/L 04/10/2017, 4:29 PM  [facility] [address] [place], [place], [address] Phone: [phone]   Fax:  [phone]  Name: [name] MRN: [id number] Date of Birth: September 11, 1960

## 2017-04-14 ENCOUNTER — Other Ambulatory Visit: Payer: Self-pay | Admitting: Family Medicine

## 2017-04-14 MED ORDER — CHLORTHALIDONE 25 MG PO TABS: 25.0000 mg | ORAL_TABLET | Freq: Every day | ORAL | 0 refills | Status: DC

## 2017-04-14 MED FILL — CHLORTHALIDONE 25 MG TABLET: 25 | 30 days supply | Qty: 30 | Fill #0

## 2017-04-15 ENCOUNTER — Other Ambulatory Visit: Payer: Self-pay | Admitting: Family Medicine

## 2017-04-15 DIAGNOSIS — I10 Essential (primary) hypertension: Secondary | ICD-10-CM

## 2017-04-15 DIAGNOSIS — I611 Nontraumatic intracerebral hemorrhage in hemisphere, cortical: Secondary | ICD-10-CM

## 2017-04-21 ENCOUNTER — Telehealth: Payer: Self-pay | Admitting: *Deleted

## 2017-04-21 ENCOUNTER — Emergency Department (HOSPITAL_COMMUNITY): Payer: Medicaid Other

## 2017-04-21 ENCOUNTER — Other Ambulatory Visit: Payer: Self-pay

## 2017-04-21 ENCOUNTER — Other Ambulatory Visit: Payer: Self-pay | Admitting: Family Medicine

## 2017-04-21 ENCOUNTER — Ambulatory Visit: Payer: Medicaid Other | Attending: Internal Medicine | Admitting: Family Medicine

## 2017-04-21 ENCOUNTER — Encounter: Payer: Self-pay | Admitting: Family Medicine

## 2017-04-21 ENCOUNTER — Emergency Department (HOSPITAL_COMMUNITY)
Admission: EM | Admit: 2017-04-21 | Discharge: 2017-04-22 | Disposition: A | Discharge status: Home / Self Care | Payer: Medicaid Other | Attending: Emergency Medicine | Admitting: Emergency Medicine

## 2017-04-21 ENCOUNTER — Encounter (HOSPITAL_COMMUNITY): Payer: Self-pay

## 2017-04-21 VITALS — BP 153/97 | HR 112

## 2017-04-21 DIAGNOSIS — R0609 Other forms of dyspnea: Secondary | ICD-10-CM | POA: Diagnosis not present

## 2017-04-21 DIAGNOSIS — R002 Palpitations: Secondary | ICD-10-CM | POA: Diagnosis present

## 2017-04-21 DIAGNOSIS — R569 Unspecified convulsions: Secondary | ICD-10-CM | POA: Diagnosis not present

## 2017-04-21 DIAGNOSIS — R0602 Shortness of breath: Secondary | ICD-10-CM

## 2017-04-21 DIAGNOSIS — Z6841 Body Mass Index (BMI) 40.0 and over, adult: Secondary | ICD-10-CM | POA: Insufficient documentation

## 2017-04-21 DIAGNOSIS — Z9114 Patient's other noncompliance with medication regimen: Secondary | ICD-10-CM | POA: Diagnosis not present

## 2017-04-21 DIAGNOSIS — I611 Nontraumatic intracerebral hemorrhage in hemisphere, cortical: Secondary | ICD-10-CM

## 2017-04-21 DIAGNOSIS — Z888 Allergy status to other drugs, medicaments and biological substances status: Secondary | ICD-10-CM | POA: Insufficient documentation

## 2017-04-21 DIAGNOSIS — R600 Localized edema: Secondary | ICD-10-CM | POA: Diagnosis not present

## 2017-04-21 DIAGNOSIS — Z8673 Personal history of transient ischemic attack (TIA), and cerebral infarction without residual deficits: Secondary | ICD-10-CM | POA: Insufficient documentation

## 2017-04-21 DIAGNOSIS — I69351 Hemiplegia and hemiparesis following cerebral infarction affecting right dominant side: Secondary | ICD-10-CM | POA: Diagnosis not present

## 2017-04-21 DIAGNOSIS — I5030 Unspecified diastolic (congestive) heart failure: Secondary | ICD-10-CM | POA: Diagnosis not present

## 2017-04-21 DIAGNOSIS — I119 Hypertensive heart disease without heart failure: Secondary | ICD-10-CM | POA: Diagnosis not present

## 2017-04-21 DIAGNOSIS — Z91012 Allergy to eggs: Secondary | ICD-10-CM | POA: Insufficient documentation

## 2017-04-21 DIAGNOSIS — I5189 Other ill-defined heart diseases: Secondary | ICD-10-CM

## 2017-04-21 DIAGNOSIS — I519 Heart disease, unspecified: Secondary | ICD-10-CM

## 2017-04-21 DIAGNOSIS — I10 Essential (primary) hypertension: Secondary | ICD-10-CM | POA: Diagnosis not present

## 2017-04-21 DIAGNOSIS — R000 Tachycardia, unspecified: Secondary | ICD-10-CM | POA: Diagnosis not present

## 2017-04-21 DIAGNOSIS — I110 Hypertensive heart disease with heart failure: Secondary | ICD-10-CM | POA: Insufficient documentation

## 2017-04-21 DIAGNOSIS — Z79899 Other long term (current) drug therapy: Secondary | ICD-10-CM | POA: Diagnosis not present

## 2017-04-21 DIAGNOSIS — Z7982 Long term (current) use of aspirin: Secondary | ICD-10-CM | POA: Insufficient documentation

## 2017-04-21 DIAGNOSIS — R7303 Prediabetes: Secondary | ICD-10-CM | POA: Insufficient documentation

## 2017-04-21 LAB — CBC
HCT: 39 % (ref 36.0–46.0)
Hemoglobin: 12 g/dL (ref 12.0–15.0)
MCH: 25.1 pg — AB (ref 26.0–34.0)
MCHC: 30.8 g/dL (ref 30.0–36.0)
MCV: 81.6 fL (ref 78.0–100.0)
PLATELETS: 341 10*3/uL (ref 150–400)
RBC: 4.78 MIL/uL (ref 3.87–5.11)
RDW: 14.5 % (ref 11.5–15.5)
WBC: 8.2 10*3/uL (ref 4.0–10.5)

## 2017-04-21 LAB — BASIC METABOLIC PANEL
ANION GAP: 14 (ref 5–15)
BUN: 17 mg/dL (ref 6–20)
CALCIUM: 9.6 mg/dL (ref 8.9–10.3)
CO2: 25 mmol/L (ref 22–32)
CREATININE: 0.92 mg/dL (ref 0.44–1.00)
Chloride: 101 mmol/L (ref 101–111)
GFR calc Af Amer: >60 mL/min (ref >60)
GLUCOSE: 119 mg/dL — AB (ref 65–99)
Potassium: 3.2 mmol/L — ABNORMAL LOW (ref 3.5–5.1)
Sodium: 140 mmol/L (ref 135–145)

## 2017-04-21 LAB — I-STAT BETA HCG BLOOD, ED (MC, WL, AP ONLY): I-stat hCG, quantitative: <5 m[IU]/mL (ref <5)

## 2017-04-21 LAB — I-STAT TROPONIN, ED
TROPONIN I, POC: 0.02 ng/mL (ref 0.00–0.08)
TROPONIN I, POC: 0.02 ng/mL (ref 0.00–0.08)

## 2017-04-21 MED ORDER — POTASSIUM CHLORIDE CRYS ER 20 MEQ PO TBCR
40.0000 meq | EXTENDED_RELEASE_TABLET | Freq: Once | ORAL | Status: AC
Start: 2017-04-21 — End: 2017-04-21
  Administered 2017-04-21: 40 meq via ORAL
  Filled 2017-04-21: qty 2

## 2017-04-21 MED FILL — ATORVASTATIN 20 MG TABLET: 20 | 30 days supply | Qty: 30 | Fill #0

## 2017-04-21 MED FILL — METOPROLOL TARTRATE 100 MG: 100 | 30 days supply | Qty: 60 | Fill #0

## 2017-04-21 NOTE — ED Notes (Signed)
Pt reports she took her evening dose of metoprolol and lipitor while sitting in the waiting room, after coming here from [facility].   EDP at bedside.

## 2017-04-21 NOTE — Patient Instructions (Signed)

## 2017-04-21 NOTE — Progress Notes (Signed)
Subjective:  Patient ID: [name], female    DOB: [birth year]  Age: 57 y.o. 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 She states dyspnea has been present for the last 2 days with associated orthopnea and pedal edema, and of note she felt generalized weakness yesterday but has improved slightly today; she complains of palpitations. She denies chest pain. EKG in the clinic reveals tachycardia of 104, ST and T wave abnormality, consider lateral ischemia.  Past Medical History:  Diagnosis Date  . Headache   . Hypertension   . Iron deficiency anemia   . Seizures ([place])   . Stroke [facility])     Past Surgical History:  Procedure Laterality Date  . CARDIAC CATHETERIZATION  04/2010   Dr. Terrence Dupont    Allergies  Allergen Reactions  . Eggs Or Egg-Derived Products   . Gabapentin     Jittery feeling, dyspnea, crawling sensation  . Gadolinium Derivatives Itching    Immediately after gad injection pt. complained of tongue numbness and lower lip/ then had rt side of body tingling/ pt. Was assessed by Dr. Jeralyn Ruths before leaving/ no medications were administered/     Outpatient Medications Prior to Visit  Medication Sig Dispense Refill  . aspirin EC 81 MG tablet Take 1 tablet (81 mg total) by mouth daily.    Marland Kitchen atorvastatin (LIPITOR) 20 MG tablet TAKE 1 TABLET BY MOUTH DAILY. 30 tablet 0  . chlorthalidone (HYGROTON) 25 MG tablet Take 1 tablet (25 mg total) by mouth daily. 30 tablet 0  . diclofenac sodium (VOLTAREN) 1 % GEL Apply 2 g topically 4 (four) times daily. 1 Tube 3  . DULoxetine (CYMBALTA) 60 MG capsule TAKE 1 CAPSULE (60 MG TOTAL) DAILY BY MOUTH. 30 capsule 1  . FLUoxetine (PROZAC) 20  MG capsule TAKE 1 CAPSULE BY MOUTH DAILY. 30 capsule 5  . fluticasone (FLONASE) 50 MCG/ACT nasal spray Place 1 spray into both nostrils daily as needed for allergies or rhinitis.    . hydroxypropyl methylcellulose / hypromellose (ISOPTO TEARS / GONIOVISC) 2.5 % ophthalmic solution Place 2 drops into both eyes as needed (irritation). 15 mL 12  . levETIRAcetam (KEPPRA) 500 MG tablet TAKE 1 & 1/2 TABLETS BY MOUTH TWICE DAILY. 90 tablet 8  . metFORMIN (GLUCOPHAGE XR) 500 MG 24 hr tablet Take 1 tablet (500 mg total) by mouth daily with breakfast. 90 tablet 3  . methocarbamol (ROBAXIN-750) 750 MG tablet Take 1 tablet (750 mg total) by mouth 2 (two) times daily as needed for muscle spasms. 60 tablet 1  . metoprolol tartrate (LOPRESSOR) 100 MG tablet TAKE 1 TABLET BY MOUTH 2 TIMES DAILY. 60 tablet 0   No facility-administered medications prior to visit.     ROS Review of Systems  Constitutional: Negative for activity change, appetite change and fatigue.  HENT: Negative for congestion, sinus pressure and sore throat.   Eyes: Negative for visual disturbance.  Respiratory: Positive for shortness of breath. Negative for cough, chest tightness and wheezing.   Cardiovascular: Positive for leg swelling. Negative for chest pain and palpitations.  Gastrointestinal: Negative for abdominal distention, abdominal pain and constipation.  Endocrine: Negative for polydipsia.  Genitourinary: Negative for dysuria and frequency.  Musculoskeletal: Negative  for arthralgias and back pain.  Skin: Negative for rash.  Neurological: Negative for tremors, light-headedness and numbness.  Hematological: Does not bruise/bleed easily.  Psychiatric/Behavioral: Negative for agitation and behavioral problems.    Objective:  BP (!) 153/97   Pulse (!) 112   LMP 02/17/2011   SpO2 97%   BP/Weight 04/21/2017 04/02/2017 32/11/9240  Systolic BP [phone]  Diastolic BP 97 80 87  Wt. (Lbs) - 280 -  BMI - 56.55 -      Physical  Exam  Constitutional: She is oriented to person, place, and time. She appears well-developed and well-nourished.  Cardiovascular: Normal heart sounds and intact distal pulses. Tachycardia present.  No murmur heard. Pulmonary/Chest: Breath sounds normal. She has no wheezes. She has no rales. She exhibits no tenderness.  Dyspneic while speaking  Abdominal: Soft. Bowel sounds are normal. She exhibits no distension and no mass. There is no tenderness.  Musculoskeletal: Normal range of motion.  Neurological: She is alert and oriented to person, place, and time.  Skin: Skin is warm and dry.  Psychiatric: She has a normal mood and affect.     Assessment & Plan:   1. Benign essential HTN Uncontrolled blood pressure due to running out of medications She also has tachycardia due to being out of metoprolol  2. Diastolic dysfunction EF 29-79%, grade 2 diastolic dysfunction from echo of 09/2015  3. Other form of dyspnea We will need to exclude pulmonary edema given acute dyspnea Also oximetry is 97% Refer to the ED STAT EKG findings unchanged from 2017  No orders of the defined types were placed in this encounter.   Follow-up: Return in about 4 days (around 04/25/2017) for follow up of chronic medical conditions.   Charlott Rakes MD

## 2017-04-21 NOTE — Telephone Encounter (Signed)
Pt state she was told to come in for sx's: SOB, increased HR.  VS: 153/97  Pulse:112  R:20  SpO2: 97% PCP aware Will work in for appt today.

## 2017-04-21 NOTE — ED Provider Notes (Signed)
[facility] EMERGENCY DEPARTMENT Provider Note   CSN: [id number] Arrival date & time: 04/21/17  1445     History   Chief Complaint Chief Complaint  Patient presents with  . Shortness of Breath  . Palpitations    HPI [name] is a 57 y.o. female.  Patient has had 2 days of dyspnea and palpitations associated with running out of her beta blocker.  She was seen by PCP today and given refill.  She took the medication in the ED lobby and her symptoms have resolved.   The history is provided by the patient.  Palpitations   This is a new problem. The current episode started 2 days ago. The problem occurs constantly. The problem has been resolved. The problem is associated with beta blockers. Associated symptoms include lower extremity edema and shortness of breath. Pertinent negatives include no fever, no chest pain, no chest pressure, no orthopnea, no abdominal pain, no nausea, no vomiting, no back pain and no cough. Risk factors include obesity.    Past Medical History:  Diagnosis Date  . Headache   . Hypertension   . Iron deficiency anemia   . Seizures ([place])   . Stroke [facility])     Patient Active Problem List   Diagnosis Date Noted  . Headache 04/02/2017  . Seizure disorder ([place]) 10/02/2016  . Obstructive sleep apnea 11/29/2015  . Right shoulder pain 11/29/2015  . Adjustment disorder with depressed mood 10/19/2015  . Nocturnal enuresis   . Hypokalemia   . Anemia, iron deficiency   . Super obese   . Prediabetes   . Orthostatic hypotension   . Supplemental oxygen dependent   . Diastolic dysfunction   . Tachypnea   . AKI (acute kidney injury) ([place])   . Acute blood loss anemia   . Lethargy   . Wheezing   . Benign essential HTN   . ICH (intracerebral hemorrhage) ([place]) 10/06/2015    Past Surgical History:  Procedure Laterality Date  . CARDIAC CATHETERIZATION  04/2010   Dr. Terrence Dupont    OB History    No data available       Home  Medications    Prior to Admission medications   Medication Sig Start Date End Date Taking? Authorizing Provider  acetaminophen (TYLENOL) 500 MG tablet Take 500 mg by mouth every 6 (six) hours as needed for headache (pain).   Yes [provider]  aspirin EC 81 MG tablet Take 1 tablet (81 mg total) by mouth daily. Patient taking differently: Take 81 mg by mouth at bedtime.  04/22/16  Yes Langeland, Dawn T, MD  atorvastatin (LIPITOR) 20 MG tablet TAKE 1 TABLET BY MOUTH DAILY. Patient taking differently: TAKE 1 TABLET (20 MG) BY MOUTH DAILY 04/21/17  Yes Charlott Rakes, MD  cetirizine (ZYRTEC) 10 MG tablet Take 10 mg by mouth daily as needed (allergic reaction).   Yes [provider]  chlorthalidone (HYGROTON) 25 MG tablet Take 1 tablet (25 mg total) by mouth daily. 04/14/17  Yes Charlott Rakes, MD  diclofenac sodium (VOLTAREN) 1 % GEL Apply 2 g topically 4 (four) times daily. Patient taking differently: Apply 1 application topically at bedtime as needed (pain).  09/10/16  Yes Charlott Rakes, MD  diphenhydrAMINE (BENADRYL) 25 MG tablet Take 25 mg by mouth daily as needed for allergies (bee stings).   Yes [provider]  DULoxetine (CYMBALTA) 60 MG capsule TAKE 1 CAPSULE (60 MG TOTAL) DAILY BY MOUTH. 03/26/17  Yes Patel, Domenick Bookbinder,  MD  FLUoxetine (PROZAC) 20 MG capsule TAKE 1 CAPSULE BY MOUTH DAILY. Patient taking differently: TAKE 1 CAPSULE (20 MG) BY MOUTH DAILY AT BEDTIME 03/28/17  Yes Newlin, Enobong, MD  fluticasone (FLONASE) 50 MCG/ACT nasal spray Place 1 spray into both nostrils daily as needed for allergies or rhinitis.   Yes [provider]  hydroxypropyl methylcellulose / hypromellose (ISOPTO TEARS / GONIOVISC) 2.5 % ophthalmic solution Place 2 drops into both eyes as needed (irritation). 10/20/15  Yes Love, Ivan Anchors, PA-C  levETIRAcetam (KEPPRA) 500 MG tablet TAKE 1 & 1/2 TABLETS BY MOUTH TWICE DAILY. Patient taking differently: Take 750 mg by mouth 2 (two)  times daily.  04/02/17  Yes Dennie Bible, NP  metFORMIN (GLUCOPHAGE XR) 500 MG 24 hr tablet Take 1 tablet (500 mg total) by mouth daily with breakfast. 04/22/16  Yes Langeland, Leda Quail, MD  methocarbamol (ROBAXIN-750) 750 MG tablet Take 1 tablet (750 mg total) by mouth 2 (two) times daily as needed for muscle spasms. 12/12/16  Yes Jamse Arn, MD  metoprolol tartrate (LOPRESSOR) 100 MG tablet TAKE 1 TABLET BY MOUTH 2 TIMES DAILY. Patient taking differently: TAKE 1 TABLET (100 MG)  BY MOUTH 2 TIMES DAILY. 04/21/17  Yes Charlott Rakes, MD    Family History Family History  Problem Relation Age of Onset  . Diabetes Father   . Hypertension Father   . Hypertension Sister   . Stroke Brother     Social History Social History   Tobacco Use  . Smoking status: Never Smoker  . Smokeless tobacco: Never Used  Substance Use Topics  . Alcohol use: No  . Drug use: No     Allergies   Bee venom; Eggs or egg-derived products; Gabapentin; and Gadolinium derivatives   Review of Systems Review of Systems  Constitutional: Negative for chills and fever.  HENT: Negative for ear pain and sore throat.   Eyes: Negative for pain and visual disturbance.  Respiratory: Positive for shortness of breath. Negative for cough.   Cardiovascular: Positive for palpitations. Negative for chest pain and orthopnea.  Gastrointestinal: Negative for abdominal pain, nausea and vomiting.  Genitourinary: Negative for dysuria and hematuria.  Musculoskeletal: Negative for arthralgias and back pain.  Skin: Negative for color change and rash.  Neurological: Negative for seizures and syncope.  All other systems reviewed and are negative.    Physical Exam Updated Vital Signs BP 135/81 (BP Location: Left Wrist)   Pulse 67   Temp 98.4 F (36.9 C) (Oral)   Resp 20   LMP 02/17/2011   SpO2 98%   Physical Exam  Constitutional: She is oriented to person, place, and time. She appears well-developed and  well-nourished. No distress.  HENT:  Head: Normocephalic and atraumatic.  Eyes: Conjunctivae and EOM are normal. Pupils are equal, round, and reactive to light.  Neck: Neck supple.  Cardiovascular: Normal rate and regular rhythm.  Pulmonary/Chest: Effort normal and breath sounds normal. No respiratory distress.  Abdominal: Soft. There is no tenderness.  Musculoskeletal:       Right lower leg: She exhibits edema.       Left lower leg: She exhibits edema.  Neurological: She is alert and oriented to person, place, and time.  Skin: Skin is warm and dry.  Psychiatric: She has a normal mood and affect.  Nursing note and vitals reviewed.    ED Treatments / Results  Labs (all labs ordered are listed, but only abnormal results are displayed) Labs Reviewed  BASIC METABOLIC  PANEL - Abnormal; Notable for the following components:      Result Value   Potassium 3.2 (*)    Glucose, Bld 119 (*)    All other components within normal limits  CBC - Abnormal; Notable for the following components:   MCH 25.1 (*)    All other components within normal limits  I-STAT TROPONIN, ED  I-STAT BETA HCG BLOOD, ED ([facility], [facility], [facility] ONLY)    EKG  EKG Interpretation  Date/Time:  Monday April 21 2017 15:19:19 EST Ventricular Rate:  104 PR Interval:  150 QRS Duration: 78 QT Interval:  410 QTC Calculation: 539 R Axis:   26 Text Interpretation:  Sinus tachycardia Nonspecific T wave abnormality Confirmed by Lajean Saver [phone]) on 04/21/2017 8:06:41 PM       Radiology Dg Chest 2 View  Result Date: 04/21/2017 CLINICAL DATA:  Chest pain, shortness of Breath EXAM: CHEST  2 VIEW COMPARISON:  10/08/2015 FINDINGS: Heart is borderline in size. No confluent airspace opacities or effusions. No edema. No acute bony abnormality. Degenerative spurring in the thoracic spine. IMPRESSION: Borderline heart size.  No active disease. Electronically Signed   By: Rolm Baptise M.D.   On: 04/21/2017 17:06     Procedures Procedures (including critical care time)  Medications Ordered in ED Medications - No data to display   Initial Impression / Assessment and Plan / ED Course  I have reviewed the triage vital signs and the nursing notes.  Pertinent labs & imaging results that were available during my care of the patient were reviewed by me and considered in my medical decision making (see chart for details).     Ms. Olenick is a 57 year old female with past medical history significant for morbid obesity, hypertension, seizure, stroke, prediabetes who presents for palpitations and shortness of breath.  EKG obtained, personally reviewed by me, demonstrates normal sinus rhythm with nonspecific ST-T changes.  Chest x-ray obtained, personally reviewed by me, demonstrates no acute cardiac or pulmonary process.  Labs obtained and significant for negative delta troponin.  Character of the complaint is not consistent with ACS and most likely related to hypertension and tachycardia from beta-blocker withdrawal.  Patient is discharged home with strict follow-up instructions, and educational materials.  Final Clinical Impressions(s) / ED Diagnoses   Final diagnoses:  Heart palpitations  Shortness of breath    ED Discharge Orders    None       Elveria Rising, MD 04/22/17 Lynnell Catalan    Lajean Saver, MD 04/24/17 1534

## 2017-04-21 NOTE — ED Triage Notes (Addendum)
[facility] C/o feeling like her heart is pounding. She states she also began shortness of breath. Skin warm and dry. No distress noted but pt does appear to be short of breath. Pt states she has been out of her meds X1 week, including metorpolol, 100mg 

## 2017-04-22 NOTE — ED Notes (Signed)
Pt departed in NAD.  

## 2017-04-25 ENCOUNTER — Ambulatory Visit: Payer: Medicaid Other | Admitting: Family Medicine

## 2017-04-29 ENCOUNTER — Ambulatory Visit: Payer: Medicaid Other | Admitting: Neurology

## 2017-04-29 ENCOUNTER — Encounter: Payer: Self-pay | Admitting: Neurology

## 2017-04-29 VITALS — BP 144/90 | HR 76 | Ht 59.0 in | Wt 281.0 lb

## 2017-04-29 DIAGNOSIS — Z8679 Personal history of other diseases of the circulatory system: Secondary | ICD-10-CM | POA: Diagnosis not present

## 2017-04-29 DIAGNOSIS — R351 Nocturia: Secondary | ICD-10-CM

## 2017-04-29 DIAGNOSIS — G4719 Other hypersomnia: Secondary | ICD-10-CM

## 2017-04-29 DIAGNOSIS — R0683 Snoring: Secondary | ICD-10-CM | POA: Diagnosis not present

## 2017-04-29 DIAGNOSIS — R51 Headache: Secondary | ICD-10-CM | POA: Diagnosis not present

## 2017-04-29 DIAGNOSIS — G40909 Epilepsy, unspecified, not intractable, without status epilepticus: Secondary | ICD-10-CM

## 2017-04-29 DIAGNOSIS — R0681 Apnea, not elsewhere classified: Secondary | ICD-10-CM | POA: Diagnosis not present

## 2017-04-29 DIAGNOSIS — R519 Headache, unspecified: Secondary | ICD-10-CM

## 2017-04-29 NOTE — Progress Notes (Signed)
Subjective:    [name] ID: [name] is a 57 y.o. female.  HPI     Mary Age, MD, PhD [facility] [address].O. [place], [place] [address]  Dear Hoyle Sauer and Cornelius [name],   I saw your [name], [name], upon your kind request in my clinic today for initial consultation of her sleep disorder, in particular, concern for underlying obstructive sleep apnea. [name] is unaccompanied today. As you know, [name] is a 57 year old right-handed woman with an underlying medical history of intracranial hemorrhage, seizure disorder, iron deficiency anemia, hypertension, hyperlipidemia, depression, and morbid obesity with a BMI over 50, who reports snoring and excessive daytime somnolence. I reviewed your office note from 04/02/2017. Her Epworth sleepiness score is 15 out of 24 today, fatigue score is 50/63. She is a nonsmoker and does not drink alcohol, she drinks caffeine in Mary form of tea, or [facility]. She is trying to reduce her soda intake, admits to not liking water but is trying to increase her water intake. She had an adenoidectomy as a child. She has gained weight in Mary past few years. She does not keep a very fit scheduled for her sleep and wake time. Generally in bed around midnight, wakeup time around 6:30 or so. She does not work. 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 She is familiar with CPAP therapy but would have concerns about a trying CPAP herself. She endorses being a restless sleeper. She has at times toward loudly and woken herself up with her snoring or gasping sensations. Her husband has noted positive in her breathing. She lives with her husband and her brother. She has 2 grown daughters. She has occasional morning headaches and has significant nocturia about 4 times per night on average. She is not aware of any family history of OSA.  Her Past Medical History Is Significant For: Past Medical History:  Diagnosis  Date  . Headache   . Hypertension   . Iron deficiency anemia   . Seizures ([place])   . Stroke [facility])     Her Past Surgical History Is Significant For: Past Surgical History:  Procedure Laterality Date  . CARDIAC CATHETERIZATION  04/2010   Dr. Terrence Dupont    Her Family History Is Significant For: Family History  Problem Relation Age of Onset  . Diabetes Father   . Hypertension Father   . Hypertension Sister   . Stroke Brother     Her Social History Is Significant For: Social History   Socioeconomic History  . Marital status: Married    Spouse name: None  . Number of children: None  . Years of education: None  . Highest education level: None  Social Needs  . Financial resource strain: None  . Food insecurity - worry: None  . Food insecurity - inability: None  . Transportation needs - medical: None  . Transportation needs - non-medical: None  Occupational History  . None  Tobacco Use  . Smoking status: Never Smoker  . Smokeless tobacco: Never Used  Substance and Sexual Activity  . Alcohol use: No  . Drug use: No  . Sexual activity: None  Other Topics Concern  . None  Social History Narrative  . None    Her Allergies Are:  Allergies  Allergen Reactions  . Bee Venom Swelling    Facial swelling  . Eggs Or Egg-Derived Products Nausea And Vomiting and Swelling    Mouth swelled  .  Gabapentin     Jittery feeling, dyspnea, crawling sensation  . Gadolinium Derivatives Itching    Immediately after gad injection pt. complained of tongue numbness and lower lip/ then had rt side of body tingling/ pt. Was assessed by Dr. Jeralyn Ruths before leaving/ no medications were administered/  :   Her Current Medications Are:  Outpatient Encounter Medications as of 04/29/2017  Medication Sig  . acetaminophen (TYLENOL) 500 MG tablet Take 500 mg by mouth every 6 (six) hours as needed for headache (pain).  Marland Kitchen aspirin EC 81 MG tablet Take 1 tablet (81 mg total) by mouth daily. ([name] taking  differently: Take 81 mg by mouth at bedtime. )  . atorvastatin (LIPITOR) 20 MG tablet TAKE 1 TABLET BY MOUTH DAILY. ([name] taking differently: TAKE 1 TABLET (20 MG) BY MOUTH DAILY)  . cetirizine (ZYRTEC) 10 MG tablet Take 10 mg by mouth daily as needed (allergic reaction).  . chlorthalidone (HYGROTON) 25 MG tablet Take 1 tablet (25 mg total) by mouth daily.  . diclofenac sodium (VOLTAREN) 1 % GEL Apply 2 g topically 4 (four) times daily. ([name] taking differently: Apply 1 application topically at bedtime as needed (pain). )  . diphenhydrAMINE (BENADRYL) 25 MG tablet Take 25 mg by mouth daily as needed for allergies (bee stings).  . DULoxetine (CYMBALTA) 60 MG capsule TAKE 1 CAPSULE (60 MG TOTAL) DAILY BY MOUTH.  Marland Kitchen FLUoxetine (PROZAC) 20 MG capsule TAKE 1 CAPSULE BY MOUTH DAILY. ([name] taking differently: TAKE 1 CAPSULE (20 MG) BY MOUTH DAILY AT BEDTIME)  . fluticasone (FLONASE) 50 MCG/ACT nasal spray Place 1 spray into both nostrils daily as needed for allergies or rhinitis.  . hydroxypropyl methylcellulose / hypromellose (ISOPTO TEARS / GONIOVISC) 2.5 % ophthalmic solution Place 2 drops into both eyes as needed (irritation).  Marland Kitchen levETIRAcetam (KEPPRA) 500 MG tablet TAKE 1 & 1/2 TABLETS BY MOUTH TWICE DAILY. ([name] taking differently: Take 750 mg by mouth 2 (two) times daily. )  . metFORMIN (GLUCOPHAGE XR) 500 MG 24 hr tablet Take 1 tablet (500 mg total) by mouth daily with breakfast.  . methocarbamol (ROBAXIN-750) 750 MG tablet Take 1 tablet (750 mg total) by mouth 2 (two) times daily as needed for muscle spasms.  . metoprolol tartrate (LOPRESSOR) 100 MG tablet TAKE 1 TABLET BY MOUTH 2 TIMES DAILY. ([name] taking differently: TAKE 1 TABLET (100 MG)  BY MOUTH 2 TIMES DAILY.)   No facility-administered encounter medications on file as of 04/29/2017.   :  Review of Systems:  Out of a complete 14 point review of systems, all are reviewed and negative with Mary exception of these symptoms as  listed below: Review of Systems  Neurological:       Pt presents today to discuss her sleep. Pt has never had a sleep study but does endorse snoring.  Epworth Sleepiness Scale 0= would never doze 1= slight chance of dozing 2= moderate chance of dozing 3= high chance of dozing  Sitting and reading: 2 Watching TV: 2 Sitting inactive in a public place (ex. Theater or meeting): 0 As a passenger in a car for an hour without a break: 3 Lying down to rest in Mary afternoon: 3 Sitting and talking to someone: 2 Sitting quietly after lunch (no alcohol): 3 In a car, while stopped in traffic: 0 Total: 15     Objective:  Neurological Exam  Physical Exam Physical Examination:   Vitals:   04/29/17 1102  BP: (!) 144/90  Pulse: 76    General  Examination: [name] is a very pleasant 57 y.o. female in no acute distress. She appears well-developed and well-nourished and well groomed.   HEENT: Normocephalic, atraumatic, pupils are equal, round and reactive to light and accommodation. Extraocular tracking is good without limitation to gaze excursion or nystagmus noted. Normal smooth pursuit is noted. Hearing is grossly intact. Face is symmetric with normal facial animation and normal facial sensation. Speech is clear with no dysarthria noted. There is no hypophonia. There is no lip, neck/head, jaw or voice tremor. Neck is supple with full range of passive and active motion. There are no carotid bruits on auscultation. Oropharynx exam reveals: mild mouth dryness, adequate dental hygiene and marked airway crowding, due to larger tongue, large uvula, thicker soft palate, smaller airway entry and tonsils in place, approximately 2+ bilaterally, Mallampati is class III. Neck circumference is 20-1/8 inches.  Chest: Clear to auscultation without wheezing, rhonchi or crackles noted.  Heart: S1+S2+0, regular and normal without murmurs, rubs or gallops noted.   Abdomen: Soft, non-tender and non-distended  with normal bowel sounds appreciated on auscultation.  Extremities: There is 1+ pitting edema in Mary distal lower extremities bilaterally. Pedal pulses are intact.  Skin: Warm and dry without trophic changes noted.  Musculoskeletal: exam reveals no obvious joint deformities, tenderness or joint swelling or erythema.   Neurologically:  Mental status: [name] is awake, alert and oriented in all 4 spheres. Her immediate and remote memory, attention, language skills and fund of knowledge are appropriate. There is no evidence of aphasia, agnosia, apraxia or anomia. Speech is clear with normal prosody and enunciation. Thought process is linear. Mood is normal and affect is normal.  Cranial nerves II - XII are as described above under HEENT exam. In addition: shoulder shrug is normal with equal shoulder height noted. Motor exam: Normal bulk, strength and tone is noted on Mary L, mild RUE and RLE weakness. There is no drift, tremor or rebound. Romberg is not tested for safety. Fine motor skills and coordination: grossly intact.  Cerebellar testing: No dysmetria or intention tremor. There is no truncal or gait ataxia.  Sensory exam: intact to light touch in Mary upper and lower extremities.  Gait, station and balance: She stands with difficulty. She stands somewhat wide-based. She walks with a 4 pronged cane on Mary left.   Assessment and Plan:  In summary, [name] is a very pleasant 57 y.o.-year old female with an underlying medical history of intracranial hemorrhage, seizure disorder, iron deficiency anemia, hypertension, hyperlipidemia, depression, and morbid obesity with a BMI over 50, whose history and physical exam are concerning for obstructive sleep apnea (OSA). I had a long chat with [name] about my findings and Mary diagnosis of OSA, its prognosis and treatment options. We talked about medical treatments, surgical interventions and non-pharmacological approaches. I explained in  particular Mary risks and ramifications of untreated moderate to severe OSA, especially with respect to developing cardiovascular disease down [place] [address], including congestive heart failure, difficult to treat hypertension, cardiac arrhythmias, or stroke. Even type 2 diabetes has, in part, been linked to untreated OSA. Symptoms of untreated OSA include daytime sleepiness, memory problems, mood irritability and mood disorder such as depression and anxiety, lack of energy, as well as recurrent headaches, especially morning headaches. We talked about trying to maintain a healthy lifestyle in general, as well as Mary importance of weight control. I encouraged [name] to eat healthy, exercise daily and keep well hydrated, to keep a scheduled bedtime  and wake time routine, to not skip any meals and eat healthy snacks in between meals. I advised [name] not to drive when feeling sleepy. I recommended Mary following at this time: sleep study with potential positive airway pressure titration. (We will score hypopneas at 4%.   I explained Mary sleep test procedure to [name] and also outlined possible surgical and non-surgical treatment options of OSA, including Mary use of a custom-made dental device (which would require a referral to a specialist dentist or oral surgeon), upper airway surgical options, such as pillar implants, radiofrequency surgery, tongue base surgery, and UPPP (which would involve a referral to an ENT surgeon). Rarely, jaw surgery such as mandibular advancement may be considered.  I also explained Mary CPAP treatment option to [name], who indicated that she would be reluctant, but willing to try CPAP if Mary need arises. I explained Mary importance of being compliant with PAP treatment, not only for insurance purposes but primarily to improve Her symptoms, and for [name]'s long term health benefit, including to reduce her cardiovascular risks. I answered all her questions today and Mary  [name] was in agreement. I would like to see her back after Mary sleep study is completed and encouraged her to call with any interim questions, concerns, problems or updates.   Thank you very much for allowing me to participate in Mary care of this [name]. If I can be of any further assistance to you please do not hesitate to talk to me.  Sincerely,   Mary Age, MD, PhD

## 2017-04-29 NOTE — Patient Instructions (Signed)

## 2017-04-30 ENCOUNTER — Other Ambulatory Visit: Payer: Self-pay | Admitting: Physical Medicine & Rehabilitation

## 2017-04-30 ENCOUNTER — Other Ambulatory Visit: Payer: Self-pay | Admitting: Nurse Practitioner

## 2017-04-30 DIAGNOSIS — I611 Nontraumatic intracerebral hemorrhage in hemisphere, cortical: Secondary | ICD-10-CM

## 2017-05-05 ENCOUNTER — Other Ambulatory Visit: Payer: Self-pay | Admitting: Family Medicine

## 2017-05-05 MED ORDER — METFORMIN HCL ER 500 MG PO TB24: 500.0000 mg | ORAL_TABLET | Freq: Every day | ORAL | 1 refills | Status: DC

## 2017-05-05 MED FILL — METFORMIN HCL ER 500 MG TAB: 500 | 30 days supply | Qty: 30 | Fill #0

## 2017-05-12 ENCOUNTER — Other Ambulatory Visit: Payer: Self-pay | Admitting: Family Medicine

## 2017-05-12 MED FILL — DULoxetine HCL 60 MG CPEP: 60 | 30 days supply | Qty: 30 | Fill #1

## 2017-05-12 MED FILL — levETIRAcetam 500 MG TABS: 500 | 30 days supply | Qty: 90 | Fill #6

## 2017-05-12 MED FILL — FLUoxetine HCL 20 MG CAPS: 20 | 30 days supply | Qty: 30 | Fill #1

## 2017-05-13 MED FILL — CHLORTHALIDONE 25 MG TABLET: 25 | 30 days supply | Qty: 30 | Fill #0

## 2017-05-15 ENCOUNTER — Other Ambulatory Visit: Payer: Self-pay | Admitting: Physical Medicine & Rehabilitation

## 2017-05-15 ENCOUNTER — Other Ambulatory Visit: Payer: Self-pay | Admitting: Family Medicine

## 2017-05-15 DIAGNOSIS — I10 Essential (primary) hypertension: Secondary | ICD-10-CM

## 2017-05-15 DIAGNOSIS — I611 Nontraumatic intracerebral hemorrhage in hemisphere, cortical: Secondary | ICD-10-CM

## 2017-05-15 MED FILL — METHOCARBAMOL 750 MG TABS: 750 | 30 days supply | Qty: 60 | Fill #0

## 2017-05-23 ENCOUNTER — Encounter: Payer: Self-pay | Admitting: Internal Medicine

## 2017-05-23 ENCOUNTER — Ambulatory Visit: Payer: Medicaid Other | Attending: Internal Medicine | Admitting: Internal Medicine

## 2017-05-23 ENCOUNTER — Other Ambulatory Visit: Payer: Self-pay | Admitting: Family Medicine

## 2017-05-23 ENCOUNTER — Encounter: Payer: Self-pay | Admitting: Family Medicine

## 2017-05-23 ENCOUNTER — Ambulatory Visit (HOSPITAL_BASED_OUTPATIENT_CLINIC_OR_DEPARTMENT_OTHER): Payer: Medicaid Other | Admitting: Family Medicine

## 2017-05-23 ENCOUNTER — Telehealth: Payer: Self-pay | Admitting: Pharmacist

## 2017-05-23 VITALS — BP 120/70 | HR 63 | Temp 97.8°F | Ht 59.0 in | Wt 284.5 lb

## 2017-05-23 DIAGNOSIS — R7303 Prediabetes: Secondary | ICD-10-CM | POA: Diagnosis not present

## 2017-05-23 DIAGNOSIS — Z6841 Body Mass Index (BMI) 40.0 and over, adult: Secondary | ICD-10-CM | POA: Diagnosis not present

## 2017-05-23 DIAGNOSIS — E876 Hypokalemia: Secondary | ICD-10-CM | POA: Diagnosis not present

## 2017-05-23 DIAGNOSIS — M25511 Pain in right shoulder: Secondary | ICD-10-CM | POA: Insufficient documentation

## 2017-05-23 DIAGNOSIS — Z91012 Allergy to eggs: Secondary | ICD-10-CM | POA: Insufficient documentation

## 2017-05-23 DIAGNOSIS — Z91030 Bee allergy status: Secondary | ICD-10-CM | POA: Diagnosis not present

## 2017-05-23 DIAGNOSIS — G8929 Other chronic pain: Secondary | ICD-10-CM | POA: Insufficient documentation

## 2017-05-23 DIAGNOSIS — I611 Nontraumatic intracerebral hemorrhage in hemisphere, cortical: Secondary | ICD-10-CM

## 2017-05-23 DIAGNOSIS — Z888 Allergy status to other drugs, medicaments and biological substances status: Secondary | ICD-10-CM | POA: Diagnosis not present

## 2017-05-23 DIAGNOSIS — Z7984 Long term (current) use of oral hypoglycemic drugs: Secondary | ICD-10-CM | POA: Diagnosis not present

## 2017-05-23 DIAGNOSIS — I69151 Hemiplegia and hemiparesis following nontraumatic intracerebral hemorrhage affecting right dominant side: Secondary | ICD-10-CM | POA: Insufficient documentation

## 2017-05-23 DIAGNOSIS — Z7982 Long term (current) use of aspirin: Secondary | ICD-10-CM | POA: Insufficient documentation

## 2017-05-23 DIAGNOSIS — I10 Essential (primary) hypertension: Secondary | ICD-10-CM | POA: Insufficient documentation

## 2017-05-23 DIAGNOSIS — F4321 Adjustment disorder with depressed mood: Secondary | ICD-10-CM

## 2017-05-23 DIAGNOSIS — Z79899 Other long term (current) drug therapy: Secondary | ICD-10-CM | POA: Diagnosis not present

## 2017-05-23 DIAGNOSIS — Z5321 Procedure and treatment not carried out due to patient leaving prior to being seen by health care provider: Secondary | ICD-10-CM

## 2017-05-23 MED ORDER — METFORMIN HCL ER 500 MG PO TB24: 500.0000 mg | ORAL_TABLET | Freq: Every day | ORAL | 3 refills | Status: DC

## 2017-05-23 MED ORDER — HYPROMELLOSE (GONIOSCOPIC) 2.5 % OP SOLN: 2.0000 [drp] | OPHTHALMIC | 1 refills | Status: DC | PRN

## 2017-05-23 MED ORDER — METOPROLOL TARTRATE 100 MG PO TABS: 100.0000 mg | ORAL_TABLET | Freq: Two times a day (BID) | ORAL | 3 refills | Status: DC

## 2017-05-23 MED ORDER — CHLORTHALIDONE 25 MG PO TABS: 25.0000 mg | ORAL_TABLET | Freq: Every day | ORAL | 3 refills | Status: DC

## 2017-05-23 MED ORDER — FLUTICASONE PROPIONATE 50 MCG/ACT NA SUSP: 1.0000 | Freq: Every day | NASAL | 1 refills | Status: DC | PRN

## 2017-05-23 MED ORDER — DICLOFENAC SODIUM 1 % TD GEL
2.0000 g | Freq: Four times a day (QID) | TRANSDERMAL | 3 refills | Status: DC
Start: 2017-05-23 — End: 2018-03-09

## 2017-05-23 MED ORDER — ATORVASTATIN CALCIUM 20 MG PO TABS: 20.0000 mg | ORAL_TABLET | Freq: Every day | ORAL | 3 refills | Status: DC

## 2017-05-23 MED FILL — ATORVASTATIN 20 MG TABLET: 20 | 30 days supply | Qty: 30 | Fill #0

## 2017-05-23 MED FILL — METOPROLOL TARTRATE 100 MG: 100 | 30 days supply | Qty: 60 | Fill #0

## 2017-05-23 NOTE — Progress Notes (Signed)
Subjective:  Patient ID: [name], female    DOB: 1960/08/18  Age: 57 y.o. 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  She endorses compliance with her statin and antihypertensive and denies adverse effects from medications.  For prediabetes she remains on metformin which she tolerates well.  She currently sees [name] of physical medicine and rehab for her right shoulder pain and reports improvement with initiation of Cymbalta and Voltaren gel. Her depression has not improved much and she feels like 'she's in the dump'as she gets in everybody's way even when she tries to help and now has to rely on others to assist with things she could normally do on her own previously and she sleeps a lot. She denies suicidal ideations or intent but when she feels down she eats a lot of chips and sodas. I had placed her on Prozac in 08/2016 for these symptoms. Recently seen by [facility] and is being scheduled for a sleep study. She has no other acute concerns today.  Past Medical History:  Diagnosis Date  . Headache   . Hypertension   . Iron deficiency anemia   . Seizures ([place])   . Stroke [facility])     Past Surgical History:  Procedure Laterality Date  . CARDIAC CATHETERIZATION  04/2010   Dr. Terrence Dupont    Allergies  Allergen Reactions  . Bee Venom Swelling    Facial swelling  . Eggs Or Egg-Derived Products Nausea And Vomiting and Swelling    Mouth swelled  . Gabapentin     Jittery feeling, dyspnea, crawling sensation  . Gadolinium Derivatives Itching    Immediately after gad injection pt. complained of tongue numbness and lower lip/ then had rt side of body tingling/ pt. Was assessed by Dr. Jeralyn Ruths before leaving/ no medications were  administered/     Outpatient Medications Prior to Visit  Medication Sig Dispense Refill  . acetaminophen (TYLENOL) 500 MG tablet Take 500 mg by mouth every 6 (six) hours as needed for headache (pain).    Marland Kitchen aspirin EC 81 MG tablet Take 1 tablet (81 mg total) by mouth daily. (Patient taking differently: Take 81 mg by mouth at bedtime. )    . cetirizine (ZYRTEC) 10 MG tablet Take 10 mg by mouth daily as needed (allergic reaction).    . diphenhydrAMINE (BENADRYL) 25 MG tablet Take 25 mg by mouth daily as needed for allergies (bee stings).    . DULoxetine (CYMBALTA) 60 MG capsule TAKE 1 CAPSULE DAILY BY MOUTH. 30 capsule 1  . FLUoxetine (PROZAC) 20 MG capsule TAKE 1 CAPSULE BY MOUTH DAILY. (Patient taking differently: TAKE 1 CAPSULE (20 MG) BY MOUTH DAILY AT BEDTIME) 30 capsule 5  . fluticasone (FLONASE) 50 MCG/ACT nasal spray Place 1 spray into both nostrils daily as needed for allergies or rhinitis.    . hydroxypropyl methylcellulose / hypromellose (ISOPTO TEARS / GONIOVISC) 2.5 % ophthalmic solution Place 2 drops into both eyes as needed (irritation). 15 mL 12  . levETIRAcetam (KEPPRA) 500 MG tablet TAKE 1 & 1/2 TABLETS BY MOUTH TWICE DAILY. (Patient taking differently: Take 750 mg by mouth 2 (two) times daily. ) 90 tablet 8  . methocarbamol (ROBAXIN) 750 MG tablet TAKE 1 TABLET BY MOUTH 2 TIMES DAILY AS NEEDED FOR MUSCLE SPASMS.  60 tablet 1  . atorvastatin (LIPITOR) 20 MG tablet TAKE 1 TABLET BY MOUTH DAILY. 30 tablet 0  . chlorthalidone (HYGROTON) 25 MG tablet TAKE 1 TABLET (25 MG TOTAL) BY MOUTH DAILY. 30 tablet 0  . diclofenac sodium (VOLTAREN) 1 % GEL Apply 2 g topically 4 (four) times daily. (Patient taking differently: Apply 1 application topically at bedtime as needed (pain). ) 1 Tube 3  . metFORMIN (GLUCOPHAGE XR) 500 MG 24 hr tablet Take 1 tablet (500 mg total) by mouth daily with breakfast. 90 tablet 1  . metoprolol tartrate (LOPRESSOR) 100 MG tablet TAKE 1 TABLET BY MOUTH 2 TIMES DAILY.  60 tablet 0   No facility-administered medications prior to visit.     ROS Review of Systems  Constitutional: Negative for activity change, appetite change and fatigue.  HENT: Negative for congestion, sinus pressure and sore throat.   Eyes: Negative for visual disturbance.  Respiratory: Negative for cough, chest tightness, shortness of breath and wheezing.   Cardiovascular: Negative for chest pain and palpitations.  Gastrointestinal: Negative for abdominal distention, abdominal pain and constipation.  Endocrine: Negative for polydipsia.  Genitourinary: Negative for dysuria and frequency.  Musculoskeletal: Negative for arthralgias and back pain.  Skin: Negative for rash.  Neurological: Positive for weakness. Negative for tremors, light-headedness and numbness.  Hematological: Does not bruise/bleed easily.  Psychiatric/Behavioral: Positive for dysphoric mood. Negative for agitation and behavioral problems.    Objective:  BP 120/70   Pulse 63   Temp 97.8 F (36.6 C) (Oral)   Ht 4\' 11"  (1.499 m)   Wt 284 lb 8 oz (129 kg)   LMP 02/17/2011   SpO2 96%   BMI 57.46 kg/m   BP/Weight 05/23/2017 04/29/2017 1/93/7902  Systolic BP 409 735 329  Diastolic BP 70 90 78  Wt. (Lbs) 284.5 281 -  BMI 57.46 56.76 -      Physical Exam  Constitutional: She is oriented to person, place, and time. She appears well-developed and well-nourished.  Cardiovascular: Normal rate, normal heart sounds and intact distal pulses.  No murmur heard. Pulmonary/Chest: Effort normal and breath sounds normal. She has no wheezes. She has no rales. She exhibits no tenderness.  Abdominal: Soft. Bowel sounds are normal. She exhibits no distension and no mass. There is no tenderness.  Musculoskeletal: Normal range of motion.  Neurological: She is alert and oriented to person, place, and time.  Motor strength - 4+/5 in right upper and right lower extremity 5/5 in left upper and left lower extremity  Skin: Skin is warm  and dry.  Psychiatric: She has a normal mood and affect.    CMP Latest Ref Rng & Units 04/21/2017 09/10/2016 02/20/2016  Glucose 65 - 99 mg/dL 119(H) 123(H) 113(H)  BUN 6 - 20 mg/dL 17 22 18   Creatinine 0.44 - 1.00 mg/dL 0.92 1.07(H) 0.86  Sodium 135 - 145 mmol/L 140 144 141  Potassium 3.5 - 5.1 mmol/L 3.2(L) 3.7 4.1  Chloride 101 - 111 mmol/L 101 99 103  CO2 22 - 32 mmol/L 25 28 31   Calcium 8.9 - 10.3 mg/dL 9.6 9.7 9.2  Total Protein 6.0 - 8.5 g/dL - 7.9 -  Total Bilirubin 0.0 - 1.2 mg/dL - 0.3 -  Alkaline Phos 39 - 117 IU/L - 71 -  AST 0 - 40 IU/L - 14 -  ALT 0 - 32 IU/L - 12 -    Lab Results  Component Value Date   HGBA1C 5.8 02/20/2016    Assessment & Plan:  1. Nontraumatic cortical hemorrhage of cerebral hemisphere, unspecified laterality ([place]) With slight right sided weakness Risk factor modification Continue aspirin - atorvastatin (LIPITOR) 20 MG tablet; Take 1 tablet (20 mg total) by mouth daily.  Dispense: 30 tablet; Refill: 3  2. Hypokalemia Last potassium was 3.2 Takes potassium supplements intermittently - Basic Metabolic Panel  3. Benign essential HTN Controlled - chlorthalidone (HYGROTON) 25 MG tablet; Take 1 tablet (25 mg total) by mouth daily.  Dispense: 30 tablet; Refill: 3 - metoprolol tartrate (LOPRESSOR) 100 MG tablet; Take 1 tablet (100 mg total) by mouth 2 (two) times daily.  Dispense: 60 tablet; Refill: 3  4. Chronic right shoulder pain Controlled - diclofenac sodium (VOLTAREN) 1 % GEL; Apply 2 g topically 4 (four) times daily.  Dispense: 1 Tube; Refill: 3  5. Adjustment disorder with depressed mood Situational We have discussed coping mechanisms including her new normal she would have to exercise caution with changing position and activity I have discontinued Prozac given she was placed on Cymbalta by neurology Will consider augmentation with Wellbutrin or trazodone if symptoms persist She does have a therapist and I have encouraged her to  continue with counseling  6. Prediabetes Controlled with A1c of 5.8 Discussed lifestyle modifications including weight loss Weight loss is a challenge due to the fact that she is mostly sedentary given her disability Advised to work on reducing caloric intake and avoiding late meals. - metFORMIN (GLUCOPHAGE XR) 500 MG 24 hr tablet; Take 1 tablet (500 mg total) by mouth daily with breakfast.  Dispense: 90 tablet; Refill: 3   Meds ordered this encounter  Medications  . atorvastatin (LIPITOR) 20 MG tablet    Sig: Take 1 tablet (20 mg total) by mouth daily.    Dispense:  30 tablet    Refill:  3  . chlorthalidone (HYGROTON) 25 MG tablet    Sig: Take 1 tablet (25 mg total) by mouth daily.    Dispense:  30 tablet    Refill:  3  . diclofenac sodium (VOLTAREN) 1 % GEL    Sig: Apply 2 g topically 4 (four) times daily.    Dispense:  1 Tube    Refill:  3  . metFORMIN (GLUCOPHAGE XR) 500 MG 24 hr tablet    Sig: Take 1 tablet (500 mg total) by mouth daily with breakfast.    Dispense:  90 tablet    Refill:  3  . metoprolol tartrate (LOPRESSOR) 100 MG tablet    Sig: Take 1 tablet (100 mg total) by mouth 2 (two) times daily.    Dispense:  60 tablet    Refill:  3    Follow-up: Return in about 1 month (around 06/23/2017) for for HTN.   Charlott Rakes MD

## 2017-05-23 NOTE — Telephone Encounter (Signed)
Patient presented to clinic requesting refills for fluticasone and ISOPTO tears. Neither are currently prescribed by PCP, will forward to PCP for review.

## 2017-05-23 NOTE — Progress Notes (Signed)
Pt is wanting to stay with Dr. Margarita Rana

## 2017-05-24 LAB — BASIC METABOLIC PANEL
BUN / CREAT RATIO: 26 — AB (ref 9–23)
BUN: 22 mg/dL (ref 6–24)
CHLORIDE: 102 mmol/L (ref 96–106)
CO2: 28 mmol/L (ref 20–29)
Calcium: 9.1 mg/dL (ref 8.7–10.2)
Creatinine, Ser: 0.86 mg/dL (ref 0.57–1.00)
GFR calc non Af Amer: 76 mL/min/{1.73_m2} (ref >59)
GFR, EST AFRICAN AMERICAN: 87 mL/min/{1.73_m2} (ref >59)
GLUCOSE: 92 mg/dL (ref 65–99)
Potassium: 3.9 mmol/L (ref 3.5–5.2)
SODIUM: 143 mmol/L (ref 134–144)

## 2017-05-26 ENCOUNTER — Telehealth: Payer: Self-pay

## 2017-05-26 MED FILL — FLUTICASONE PROP 50 MCG SPR: 50 | 30 days supply | Qty: 16 | Fill #0

## 2017-05-26 NOTE — Telephone Encounter (Signed)
Patient was called and informed of lab results. 

## 2017-05-27 ENCOUNTER — Ambulatory Visit (INDEPENDENT_AMBULATORY_CARE_PROVIDER_SITE_OTHER): Payer: Medicaid Other | Admitting: Neurology

## 2017-05-27 DIAGNOSIS — R0681 Apnea, not elsewhere classified: Secondary | ICD-10-CM

## 2017-05-27 DIAGNOSIS — R51 Headache: Secondary | ICD-10-CM

## 2017-05-27 DIAGNOSIS — R351 Nocturia: Secondary | ICD-10-CM

## 2017-05-27 DIAGNOSIS — G40909 Epilepsy, unspecified, not intractable, without status epilepticus: Secondary | ICD-10-CM

## 2017-05-27 DIAGNOSIS — G4733 Obstructive sleep apnea (adult) (pediatric): Secondary | ICD-10-CM | POA: Diagnosis not present

## 2017-05-27 DIAGNOSIS — G4720 Circadian rhythm sleep disorder, unspecified type: Secondary | ICD-10-CM

## 2017-05-27 DIAGNOSIS — G4719 Other hypersomnia: Secondary | ICD-10-CM

## 2017-05-27 DIAGNOSIS — Z8679 Personal history of other diseases of the circulatory system: Secondary | ICD-10-CM

## 2017-05-27 DIAGNOSIS — R0683 Snoring: Secondary | ICD-10-CM

## 2017-05-27 DIAGNOSIS — R519 Headache, unspecified: Secondary | ICD-10-CM

## 2017-05-28 ENCOUNTER — Encounter: Payer: Medicaid Other | Attending: Physical Medicine & Rehabilitation | Admitting: Physical Medicine & Rehabilitation

## 2017-05-28 ENCOUNTER — Encounter: Payer: Self-pay | Admitting: Physical Medicine & Rehabilitation

## 2017-05-28 VITALS — BP 128/78 | HR 62 | Resp 14

## 2017-05-28 DIAGNOSIS — Z5189 Encounter for other specified aftercare: Secondary | ICD-10-CM | POA: Insufficient documentation

## 2017-05-28 DIAGNOSIS — R569 Unspecified convulsions: Secondary | ICD-10-CM | POA: Insufficient documentation

## 2017-05-28 DIAGNOSIS — G8929 Other chronic pain: Secondary | ICD-10-CM | POA: Diagnosis not present

## 2017-05-28 DIAGNOSIS — M62838 Other muscle spasm: Secondary | ICD-10-CM | POA: Diagnosis not present

## 2017-05-28 DIAGNOSIS — I69398 Other sequelae of cerebral infarction: Secondary | ICD-10-CM | POA: Diagnosis not present

## 2017-05-28 DIAGNOSIS — I69249 Monoplegia of lower limb following other nontraumatic intracranial hemorrhage affecting unspecified side: Secondary | ICD-10-CM | POA: Insufficient documentation

## 2017-05-28 DIAGNOSIS — R208 Other disturbances of skin sensation: Secondary | ICD-10-CM | POA: Diagnosis not present

## 2017-05-28 DIAGNOSIS — R269 Unspecified abnormalities of gait and mobility: Secondary | ICD-10-CM

## 2017-05-28 DIAGNOSIS — R7303 Prediabetes: Secondary | ICD-10-CM | POA: Diagnosis not present

## 2017-05-28 DIAGNOSIS — I629 Nontraumatic intracranial hemorrhage, unspecified: Secondary | ICD-10-CM | POA: Insufficient documentation

## 2017-05-28 DIAGNOSIS — I10 Essential (primary) hypertension: Secondary | ICD-10-CM | POA: Insufficient documentation

## 2017-05-28 DIAGNOSIS — M25511 Pain in right shoulder: Secondary | ICD-10-CM

## 2017-05-28 DIAGNOSIS — F329 Major depressive disorder, single episode, unspecified: Secondary | ICD-10-CM | POA: Diagnosis not present

## 2017-05-28 DIAGNOSIS — M7541 Impingement syndrome of right shoulder: Secondary | ICD-10-CM | POA: Diagnosis not present

## 2017-05-28 NOTE — Progress Notes (Signed)
Subjective:    Patient ID: [name], female    DOB: April 08, 1960, 57 y.o.   MRN: [id number]  HPI  57 year old RH-female with history of morbid obesity, HTN, iron deficiency anemia who presents for follow up after right temporal intracranial hemorrhage.  Last clinic visit 02/27/17.  Since that time, pt went to the ED for chest discomfort/SOB.  Notes reviewed, thought to be due to medication withdrawal.  Now, she states she is getting better.  She states she was told by PCP not to go to pool therapy.  She is no longer on Prozaac. She has not lost weight.  She seldomly takes Robaxin.  No falls since last visit. Right shoulder had improved, but has started to hurt again.   Pain Inventory Average Pain  7 Pain Right Now 6 My pain is sharp, stabbing and aching  In the last 24 hours, has pain interfered with the following? General activity 4 Relation with others 4 Enjoyment of life 4 What TIME of day is your pain at its worst? evening and night Sleep (in general) Fair  Pain is worse with: unsure and some activites Pain improves with: heat/ice and medication Relief from Meds: 7  Mobility walk with assistance use a cane ability to climb steps?  yes do you drive?  yes transfers alone Do you have any goals in this area?  yes  Function disabled: date disabled . I need assistance with the following:  dressing, meal prep and shopping Do you have any goals in this area?  yes  Neuro/Psych numbness tingling trouble walking spasms dizziness confusion depression  Prior Studies Any changes since last visit?  no  Physicians involved in your care Any changes since last visit?  no   Family History  Problem Relation Age of Onset  . Diabetes Father   . Hypertension Father   . Hypertension Sister   . Stroke Brother    Social History   Socioeconomic History  . Marital status: Married    Spouse name: None  . Number of children: None  . Years of education: None  . Highest  education level: None  Social Needs  . Financial resource strain: None  . Food insecurity - worry: None  . Food insecurity - inability: None  . Transportation needs - medical: None  . Transportation needs - non-medical: None  Occupational History  . None  Tobacco Use  . Smoking status: Never Smoker  . Smokeless tobacco: Never Used  Substance and Sexual Activity  . Alcohol use: No  . Drug use: No  . Sexual activity: None  Other Topics Concern  . None  Social History Narrative  . None   Past Surgical History:  Procedure Laterality Date  . CARDIAC CATHETERIZATION  04/2010   Dr. Terrence Dupont   Past Medical History:  Diagnosis Date  . Headache   . Hypertension   . Iron deficiency anemia   . Seizures ([place])   . Stroke ([place])    BP 128/78 (BP Location: Left Wrist, Patient Position: Sitting, Cuff Size: Normal)   Pulse 62   Resp 14   LMP 02/17/2011   SpO2 91%   Opioid Risk Score:   Fall Risk Score:  `1  Depression screen PHQ 2/9  Depression screen [facility] 2/9 05/23/2017 02/27/2017 10/10/2016 09/10/2016 06/13/2016 05/22/2016 04/22/2016  Decreased Interest 2 1 1 2 3 1 1   Down, Depressed, Hopeless 2 1 1 1 1 2 2   PHQ - 2 Score 4 2 2  3  4 3 3   Altered sleeping 2 - - 2 - 0 2  Tired, decreased energy 2 - - 1 - 2 1  Change in appetite 1 - - 1 - 2 1  Feeling bad or failure about yourself  2 - - 2 - 0 2  Trouble concentrating (No Data) - - 1 - 0 2  Moving slowly or fidgety/restless 1 - - 2 - 1 0  Suicidal thoughts (No Data) - - 0 - 0 0  PHQ-9 Score 12 - - 12 - 8 11  Difficult doing work/chores - - - - - Very difficult -    Review of Systems  Constitutional: Positive for unexpected weight change.       Bladder control problems   HENT: Negative.   Eyes: Negative.   Respiratory: Positive for shortness of breath and wheezing.   Cardiovascular: Positive for leg swelling.  Gastrointestinal: Negative.   Endocrine: Negative.   Genitourinary: Negative.   Musculoskeletal: Positive for arthralgias  and gait problem.       Spasms   Skin: Negative.   Neurological: Positive for dizziness and weakness.       Tingling    Hematological: Negative.   Psychiatric/Behavioral: Positive for confusion and dysphoric mood.  All other systems reviewed and are negative.     Objective:   Physical Exam Constitutional: She appears well-developed and well-nourished. NAD. Morbidly obese.  HENT: Normocephalic and atraumatic.  Eyes: EOMI. No discharge.  Cardiovascular: RRR. No JVD. Respiratory: Effort normal and breath sounds normal.  GI: Soft. Bowel sounds are normal.  Musculoskeletal:  No edema. +TTP right shoulder. Pain with ROM right shoulder Neurological: She is alert and oriented.  Motor: RUE: 4+/5 throughout. RLE: 4/5 throughout Skin: Skin is warm and dry. Intact.  Psychiatric: She has a normal mood and affect.      Assessment & Plan:  57 year old RH-female with history of morbid obesity, HTN, iron deficiency anemia who presents for follow up after right temporal intracranial hemorrhage.  1.S/p right temporal intracranial hemorrhage with dysesthesias  Side effects with gabapentin             Aquatic therapy denied by insurance, encouraged follow up at [facility] x3   Cont meds  Cont Cymbalta               2. Depression     Reaction to Prozaac  Pt states she is managing with her chaplain and scriptures  Cont duloxetine.  3. Seizures             Cont meds  4. Morbid obesity  Cont follow up with dietitian             Cont to encourage weight loss again, currently gaining weight  5. Muscle spasms             Encouraged ROM and stretching  Cont Robaxin 750 BID PRN  6. Abnormality of gait - post stroke             Cont cane  Therapies completed, cont HEP  7. Right shoulder pain             Xrays reviewed previously with some degenerative change right shoulder             Cont Voltaren gel   Will consider steroid injection in future, she still does not want at  present  Cont Voltaren gel  8. Right leg pain  See #5, #7  9. Falls  In  sleep  Encouraged pt to push bed against wall or place furniture against bed to limit chances of rolling onto floor

## 2017-06-02 ENCOUNTER — Other Ambulatory Visit: Payer: Self-pay | Admitting: Neurology

## 2017-06-02 ENCOUNTER — Telehealth: Payer: Self-pay

## 2017-06-02 DIAGNOSIS — G4719 Other hypersomnia: Secondary | ICD-10-CM

## 2017-06-02 DIAGNOSIS — Z6841 Body Mass Index (BMI) 40.0 and over, adult: Secondary | ICD-10-CM

## 2017-06-02 DIAGNOSIS — G40909 Epilepsy, unspecified, not intractable, without status epilepticus: Secondary | ICD-10-CM

## 2017-06-02 DIAGNOSIS — G4733 Obstructive sleep apnea (adult) (pediatric): Secondary | ICD-10-CM

## 2017-06-02 DIAGNOSIS — G4720 Circadian rhythm sleep disorder, unspecified type: Secondary | ICD-10-CM

## 2017-06-02 NOTE — Telephone Encounter (Signed)

## 2017-06-02 NOTE — Progress Notes (Signed)
Patient referred by CM, seen by me on 04/29/17, diagnostic PSG on 05/27/17.   Please call and notify the patient that the recent sleep study showed severe obstructive sleep apnea. I recommend treatment for this in the form of CPAP. This will require a repeat sleep study for proper titration and mask fitting and correct monitoring of the oxygen saturations. Please explain to patient. I have placed an order in the chart. Thanks.  Star Age, MD, PhD [name])

## 2017-06-02 NOTE — Procedures (Signed)
PATIENT'S NAME:  [name], [name] DOB:      [birth year]      MR#:    [id number]     DATE OF RECORDING: 05/27/2017 REFERRING M.D.:  Dennie Bible, NP Study Performed:   Baseline Polysomnogram HISTORY: 57 year old woman with a history of intracranial hemorrhage, seizure disorder, iron deficiency anemia, hypertension, hyperlipidemia, depression, and morbid obesity with a BMI over 50, who reports snoring and excessive daytime somnolence. The patient endorsed the Epworth Sleepiness Scale at 15/24 points. The patient's weight 281 pounds with a height of 59 (inches), resulting in a BMI of 56.4 kg/m2. The patient's neck circumference measured 20.2 inches.  CURRENT MEDICATIONS: Acetaminophen, Aspirin, Atorvastatin, Cetirizine, Chlorthalidone, Diclofenac Sodium, Diphenhydramine, Duloxetine, Fluoxetine, Fluticasone, Hydroxypropyl, Levetiracetam, Metformin, Methocarbamol and Metoprolol tartrate.   PROCEDURE:  This is a multichannel digital polysomnogram utilizing the Somnostar 11.2 system.  Electrodes and sensors were applied and monitored per AASM Specifications.   EEG, EOG, Chin and Limb EMG, were sampled at 200 Hz.  ECG, Snore and Nasal Pressure, Thermal Airflow, Respiratory Effort, CPAP Flow and Pressure, Oximetry was sampled at 50 Hz. Digital video and audio were recorded.      BASELINE STUDY  Lights Out was at 22:50 and Lights On at 05:31.  Total recording time (TRT) was 402 minutes, with a total sleep time (TST) of  231.5 minutes.  The patient's sleep latency was 115.5 minutes, which is markedly delayed. REM latency was 274.5 minutes, which is markedly delayed. The sleep efficiency was 57.6%, which is reduced.     SLEEP ARCHITECTURE: WASO (Wake after sleep onset) was 98 minutes with several longer periods of wakefulness. There were 12 minutes in Stage N1, 196 minutes Stage N2, 0 minutes Stage N3 and 23.5 minutes in Stage REM.  The percentage of Stage N1 was 5.2%, Stage N2 was 84.7%, which is markedly  increased, Stage N3 was absent, and Stage R (REM sleep) was 10.2%, which is reduced. The arousals were noted as: 18 were spontaneous, 0 were associated with PLMs, 258 were associated with respiratory events.  Audio and video analysis did not show any abnormal or unusual movements, behaviors, phonations or vocalizations. The patient took no bathroom breaks. Moderate to loud snoring was noted. The EKG was in keeping with normal sinus rhythm (NSR).  RESPIRATORY ANALYSIS:  There were a total of 259 respiratory events:  95 obstructive apneas, 0 central apneas and 0 mixed apneas with a total of 95 apneas and an apnea index (AI) of 24.6 /hour. There were 164 hypopneas with a hypopnea index of 42.5 /hour. The patient also had 0 respiratory event related arousals (RERAs).      The total APNEA/HYPOPNEA INDEX (AHI) was 67.1/hour and the total RESPIRATORY DISTURBANCE INDEX was 67.1 /hour.  26 events occurred in REM sleep and 322 events in NREM. The REM AHI was 66.4 /hour, versus a non-REM AHI of 67.2. The patient spent 0 minutes of total sleep time in the supine position and 232 minutes in non-supine.. The supine AHI was n/a versus a non-supine AHI of 67.1.  OXYGEN SATURATION & C02:  The Wake baseline 02 saturation was 96%, with the lowest being 58%. Time spent below 89% saturation equaled 126 minutes.  PERIODIC LIMB MOVEMENTS: The patient had a total of 0 Periodic Limb Movements.  The Periodic Limb Movement (PLM) index was 0 and the PLM Arousal index was 0/hour.  Post-study, the patient indicated that sleep was better than usual.   IMPRESSION:  1. Obstructive Sleep Apnea (OSA)  2. Dysfunctions associated with sleep stages or arousal from sleep  RECOMMENDATIONS:  1. This study demonstrates severe obstructive sleep apnea, with a total AHI of 67.1/hour, REM AHI of 66.4/hour, and O2 nadir of 58%. The absence of supine sleep during this study likely underestimates the AHI and O2 nadir. Treatment with positive  airway pressure in the form of CPAP is recommended. This will require a full night titration study to optimize therapy. Other treatment options may include avoidance of supine sleep position along with weight loss, upper airway or jaw surgery in selected patients or the use of an oral appliance in certain patients. ENT evaluation and/or consultation with a maxillofacial surgeon or dentist may be feasible in some instances.    2. Please note that untreated obstructive sleep apnea carries additional perioperative morbidity. Patients with significant obstructive sleep apnea should receive perioperative PAP therapy and the surgeons and particularly the anesthesiologist should be informed of the diagnosis and the severity of the sleep disordered breathing. 3. This study shows sleep fragmentation and abnormal sleep stage percentages; these are nonspecific findings and per se do not signify an intrinsic sleep disorder or a cause for the patient's sleep-related symptoms. Causes include (but are not limited to) the first night effect of the sleep study, circadian rhythm disturbances, medication effect or an underlying mood disorder or medical problem.  4. The patient should be cautioned not to drive, work at heights, or operate dangerous or heavy equipment when tired or sleepy. Review and reiteration of good sleep hygiene measures should be pursued with any patient. 5. The patient will be seen in follow-up by Dr. Rexene Alberts at [facility] for discussion of the test results and further management strategies. The referring provider will be notified of the test results.  I certify that I have reviewed the entire raw data recording prior to the issuance of this report in accordance with the Standards of Accreditation of the American Academy of Sleep Medicine (AASM)   Star Age, MD, PhD Diplomat, American Board of Psychiatry and Neurology (Neurology and Sleep Medicine)

## 2017-06-02 NOTE — Telephone Encounter (Signed)
-----   Message from Star Age, MD sent at 06/02/2017  1:36 PM EDT ----- Patient referred by CM, seen by me on 04/29/17, diagnostic PSG on 05/27/17.   Please call and notify the patient that the recent sleep study showed severe obstructive sleep apnea. I recommend treatment for this in the form of CPAP. This will require a repeat sleep study for proper titration and mask fitting and correct monitoring of the oxygen saturations. Please explain to patient. I have placed an order in the chart. Thanks.  Star Age, MD, PhD [name])

## 2017-06-02 NOTE — Telephone Encounter (Signed)
I called pt to discuss her sleep study results. No answer, left a message asking her to call me back. 

## 2017-06-09 ENCOUNTER — Other Ambulatory Visit: Payer: Self-pay | Admitting: Physical Medicine & Rehabilitation

## 2017-06-09 MED FILL — CHLORTHALIDONE 25 MG TABLET: 25 | 30 days supply | Qty: 30 | Fill #0

## 2017-06-09 MED FILL — METFORMIN HCL ER 500 MG TAB: 500 | 30 days supply | Qty: 30 | Fill #1

## 2017-06-09 MED FILL — DULoxetine HCL 60 MG CPEP: 60 | 30 days supply | Qty: 30 | Fill #0

## 2017-06-11 IMAGING — CT CT HEAD W/O CM
2 of 6 series · 12 of 47 positions shown, 15 images · non-contrast
Comparison: 10/07/2015 and 10/06/2015 CT.

CLINICAL DATA: 54-year-old hypertensive female with intracranial
hemorrhage presenting with increasing lethargy. Subsequent
encounter.

EXAM:
CT HEAD WITHOUT CONTRAST
TECHNIQUE: Contiguous axial images were obtained from the base of the skull
through the vertex without intravenous contrast.

[Series 4: head 5.0 h30s · axial · 0.42mm/px · z∈[-88,+32]mm · 9 of 30 slices shown, 12 images]
[im 3/30  brain]
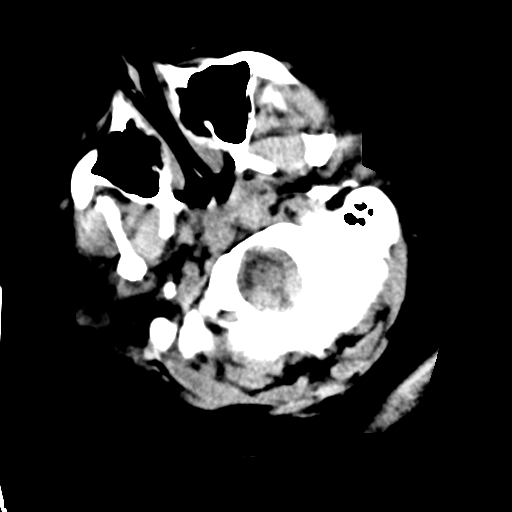
[im 3/30  bone]
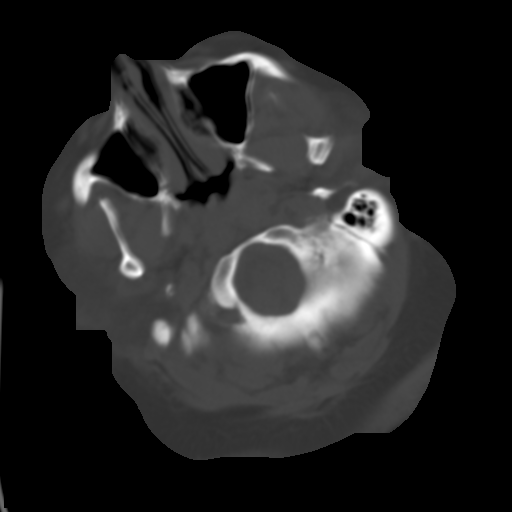
[im 6/30  brain]
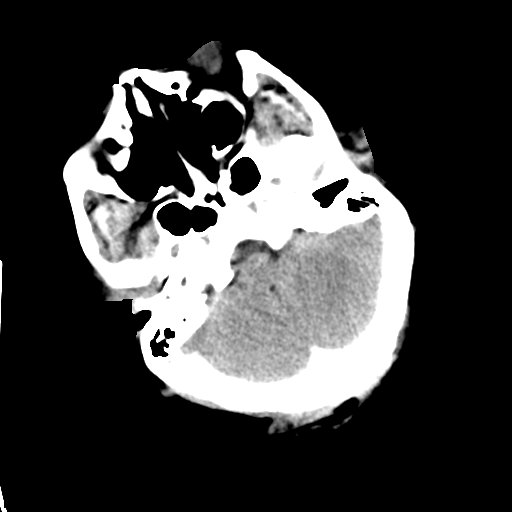
[im 9/30  brain]
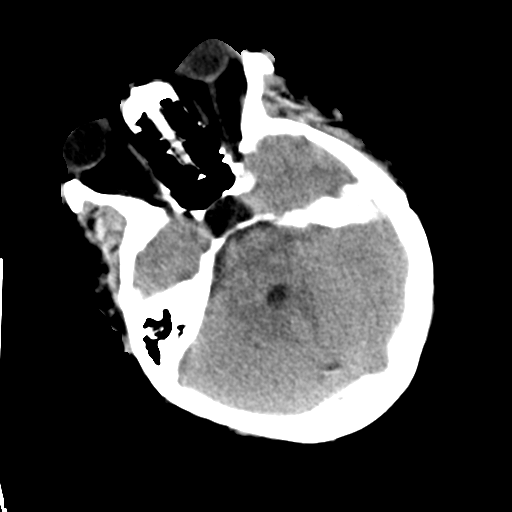
[im 12/30  brain]
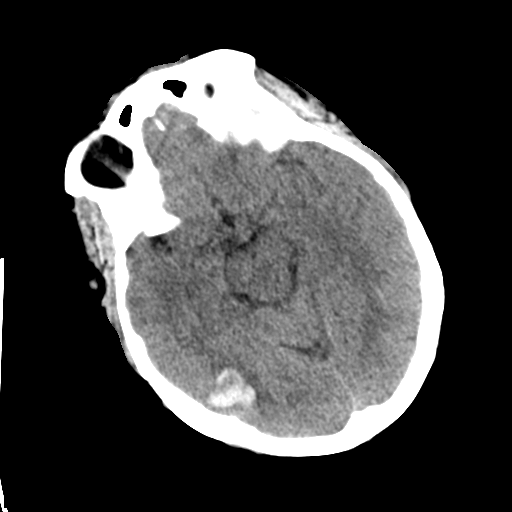
[im 16/30  brain]
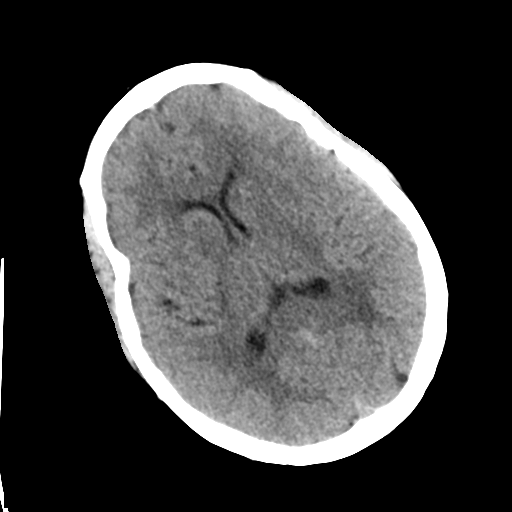
[im 16/30  bone]
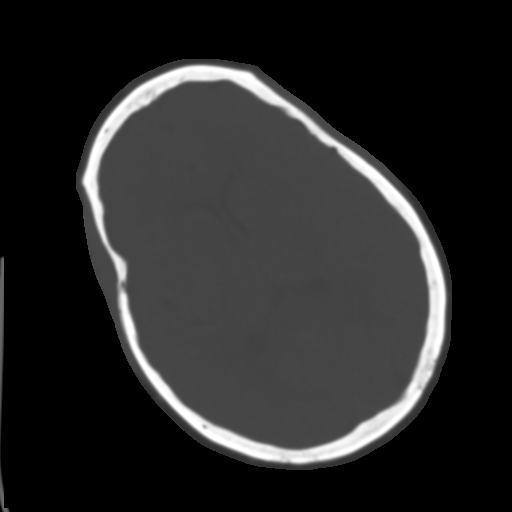
[im 18/30  brain]
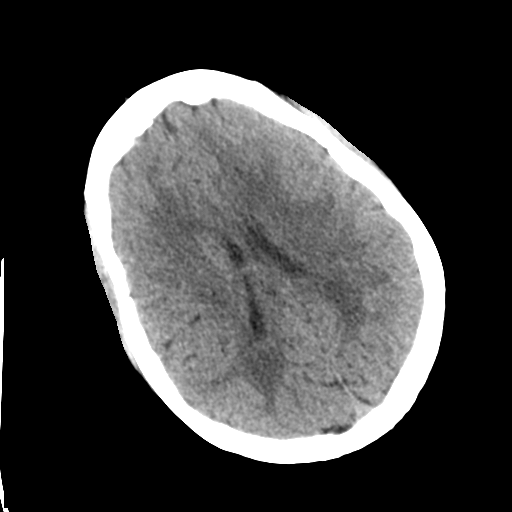
[im 21/30  brain]
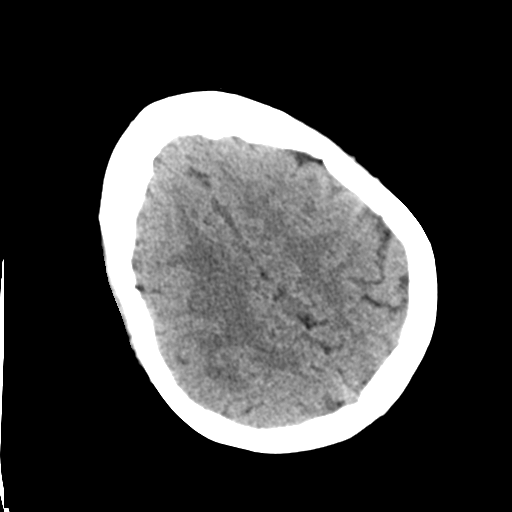
[im 24/30  brain]
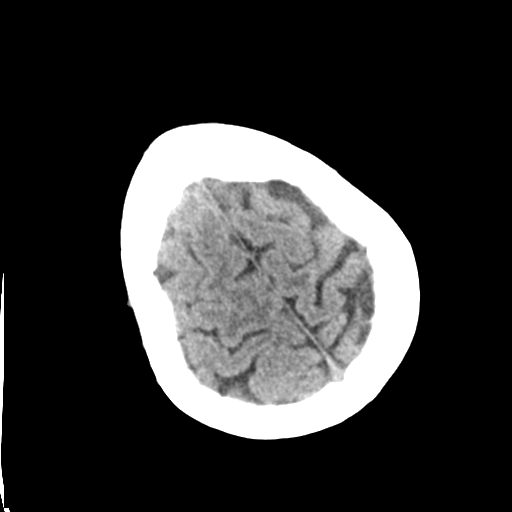
[im 27/30  brain]
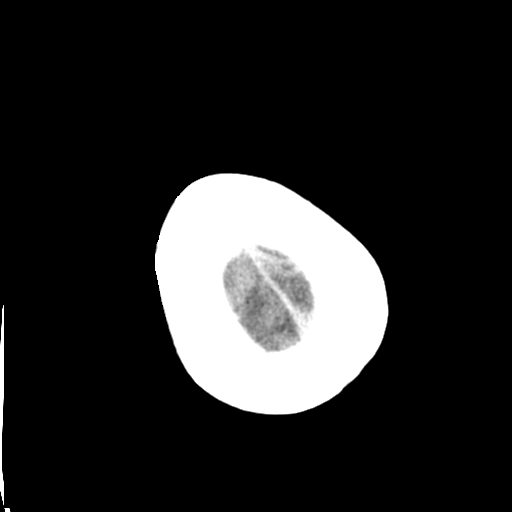
[im 27/30  bone]
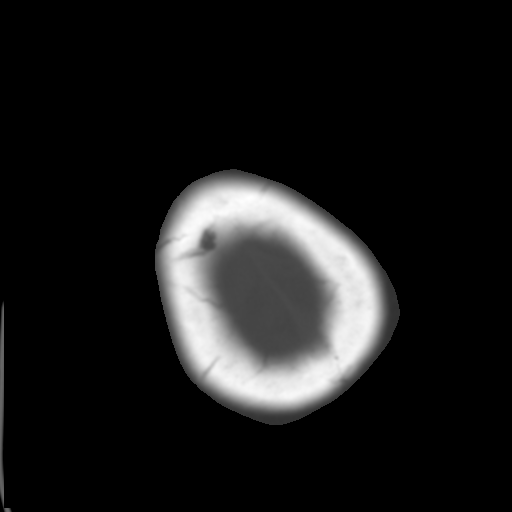

[Series 9: head 3.0 mpr · coronal · 0.13mm/px · 3 of 66 slices shown]
[im 58/66  brain]
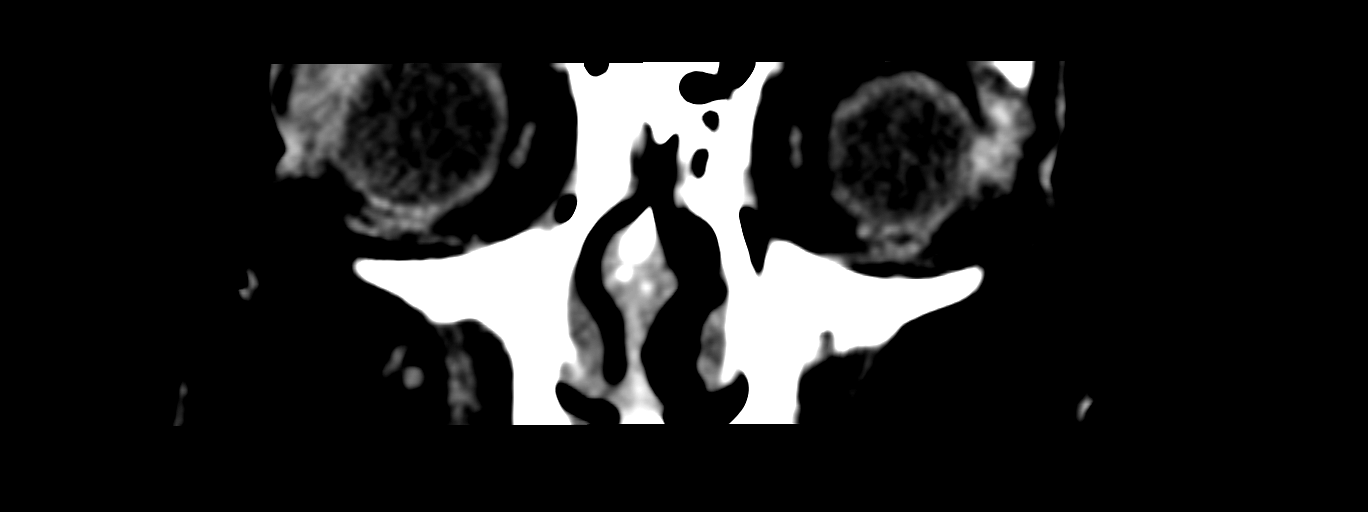
[im 60/66  brain]
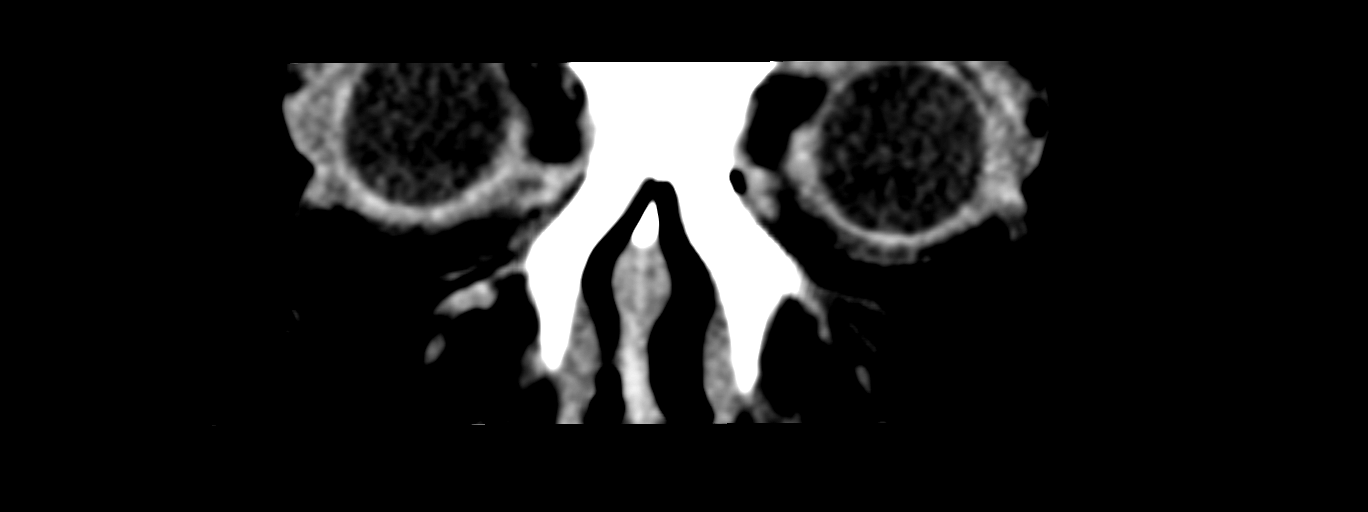
[im 62/66  brain]
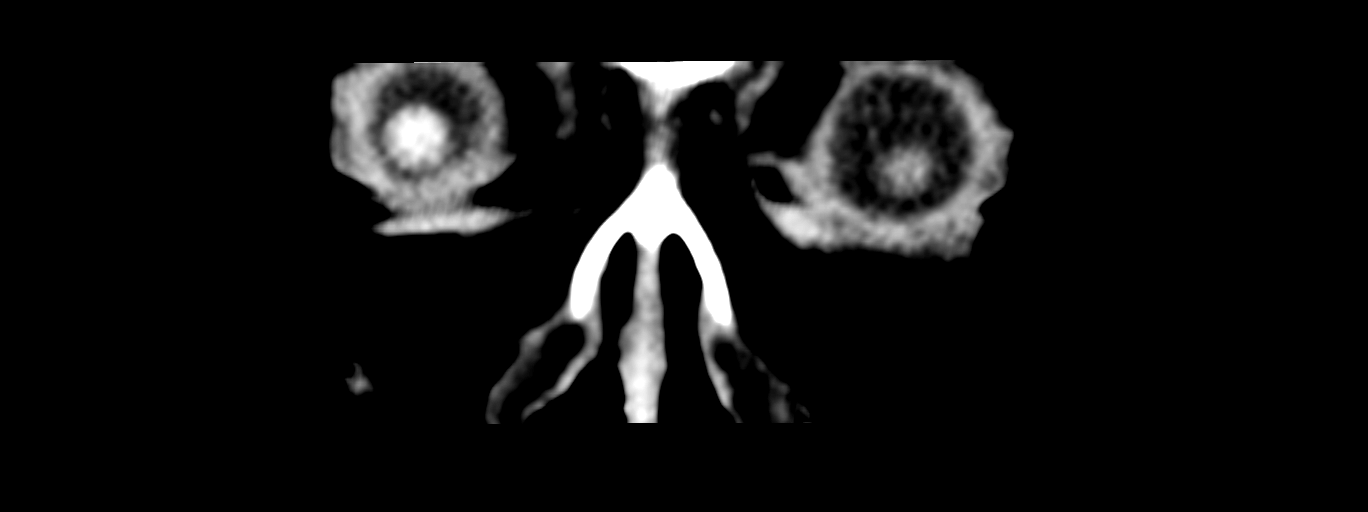

[12 of 47 positions shown; findings below may reference images not displayed]

FINDINGS: 2 x 1.9 x 1.3 cm posterior right temporal lobe hemorrhage appears
similar to recent exam. The degree of surrounding vasogenic edema is
minimally more prominent.

No new intracranial hemorrhage.

No CT evidence of large acute thrombotic infarct.

Nonspecific white matter changes suggestive of chronic microvascular
disease unchanged.

No hydrocephalus.

No intracranial mass lesion noted on this unenhanced exam.

Partially empty expanded sella once again noted.

Exophthalmos.
IMPRESSION: 2 x 1.9 x 1.3 cm (volume = 2.6 cc) posterior right temporal lobe
hemorrhage without significant change from prior examination (when
utilizing similar landmarks). The degree of surrounding vasogenic
edema is minimally more prominent.

No new intracranial hemorrhage.

Remainder of findings stable.

## 2017-06-13 ENCOUNTER — Encounter (HOSPITAL_COMMUNITY): Payer: Self-pay | Admitting: Emergency Medicine

## 2017-06-13 ENCOUNTER — Ambulatory Visit (HOSPITAL_COMMUNITY)
Admission: EM | Admit: 2017-06-13 | Discharge: 2017-06-13 | Disposition: A | Discharge status: Home / Self Care | Payer: Medicaid Other | Attending: Family Medicine | Admitting: Family Medicine

## 2017-06-13 ENCOUNTER — Other Ambulatory Visit: Payer: Self-pay

## 2017-06-13 DIAGNOSIS — R040 Epistaxis: Secondary | ICD-10-CM

## 2017-06-13 NOTE — ED Triage Notes (Signed)
The patient presented to the [facility] with a complaint of a nose bleed that started about 1 hour ago. There was no bleeding at the time of triage.

## 2017-06-13 NOTE — ED Provider Notes (Signed)
[facility]   [id number] 06/13/17 Arrival Time: 1424   SUBJECTIVE:  [name] is a 57 y.o. female who presents to the urgent care with complaint of epistaxis which began a couple hours before arrival.  No h/o epistaxis  C/o allergies.  No headache.  Epistaxis stopped after applying pressure to nose in waiting room    Past Medical History:  Diagnosis Date  . Headache   . Hypertension   . Iron deficiency anemia   . Seizures ([facility])   . Stroke [facility])    Family History  Problem Relation Age of Onset  . Diabetes Father   . Hypertension Father   . Hypertension Sister   . Stroke Brother    Social History   Socioeconomic History  . Marital status: Married    Spouse name: Not on file  . Number of children: Not on file  . Years of education: Not on file  . Highest education level: Not on file  Occupational History  . Not on file  Social Needs  . Financial resource strain: Not on file  . Food insecurity:    Worry: Not on file    Inability: Not on file  . Transportation needs:    Medical: Not on file    Non-medical: Not on file  Tobacco Use  . Smoking status: Never Smoker  . Smokeless tobacco: Never Used  Substance and Sexual Activity  . Alcohol use: No  . Drug use: No  . Sexual activity: Not on file  Lifestyle  . Physical activity:    Days per week: Not on file    Minutes per session: Not on file  . Stress: Not on file  Relationships  . Social connections:    Talks on phone: Not on file    Gets together: Not on file    Attends religious service: Not on file    Active member of club or organization: Not on file    Attends meetings of clubs or organizations: Not on file    Relationship status: Not on file  . Intimate partner violence:    Fear of current or ex partner: Not on file    Emotionally abused: Not on file    Physically abused: Not on file    Forced sexual activity: Not on file  Other Topics Concern  . Not on file  Social History  Narrative  . Not on file   No outpatient medications have been marked as taking for the 06/13/17 encounter [facility] Encounter).   Allergies  Allergen Reactions  . Bee Venom Swelling    Facial swelling  . Eggs Or Egg-Derived Products Nausea And Vomiting and Swelling    Mouth swelled  . Gabapentin     Jittery feeling, dyspnea, crawling sensation  . Gadolinium Derivatives Itching    Immediately after gad injection pt. complained of tongue numbness and lower lip/ then had rt side of body tingling/ pt. Was assessed by Dr. Jeralyn Ruths before leaving/ no medications were administered/      ROS: As per HPI, remainder of ROS negative.   OBJECTIVE:   Vitals:   06/13/17 1450  BP: 134/60  Pulse: 63  Resp: 20  Temp: 98.4 F (36.9 C)  TempSrc: Oral  SpO2: 99%     General appearance: alert; no distress Eyes: PERRL; EOMI; conjunctiva normal HENT: normocephalic; atraumatic;nasal mucosa without blood, punctate right nasal plexus noted and cauterized.; oral mucosa normal Neck: supple Back: no CVA tenderness Extremities: no cyanosis or edema; symmetrical  with no gross deformities Skin: warm and dry Neurologic: normal gait; grossly normal Psychological: alert and cooperative; normal mood and affect      Labs:  Results for orders placed or performed in visit on 53/74/82  Basic Metabolic Panel  Result Value Ref Range   Glucose 92 65 - 99 mg/dL   BUN 22 6 - 24 mg/dL   Creatinine, Ser 0.86 0.57 - 1.00 mg/dL   GFR calc non Af Amer 76 >59 mL/min/1.73   GFR calc Af Amer 87 >59 mL/min/1.73   BUN/Creatinine Ratio 26 (H) 9 - 23   Sodium 143 134 - 144 mmol/L   Potassium 3.9 3.5 - 5.2 mmol/L   Chloride 102 96 - 106 mmol/L   CO2 28 20 - 29 mmol/L   Calcium 9.1 8.7 - 10.2 mg/dL    Labs Reviewed - No data to display  No results found.     ASSESSMENT & PLAN:  1. Acute anterior epistaxis     No orders of the defined types were placed in this encounter.   Reviewed expectations  re: course of current medical issues. Questions answered. Outlined signs and symptoms indicating need for more acute intervention. Patient verbalized understanding. After Visit Summary given.    Procedures:      Robyn Haber, MD 06/13/17 [phone]

## 2017-06-13 NOTE — Discharge Instructions (Addendum)
Stop the aspirin for a week.  Use afrin (oxymetazoline) nasal spray if the nose bleed returns.

## 2017-06-16 MED FILL — ATORVASTATIN 20 MG TABLET: 20 | 30 days supply | Qty: 30 | Fill #0

## 2017-06-16 MED FILL — levETIRAcetam 500 MG TABS: 500 | 30 days supply | Qty: 90 | Fill #0

## 2017-06-23 MED FILL — METOPROLOL TARTRATE 100 MG: 100 | 30 days supply | Qty: 60 | Fill #0

## 2017-06-27 ENCOUNTER — Ambulatory Visit: Payer: Medicaid Other | Admitting: Family Medicine

## 2017-07-07 MED FILL — DULoxetine HCL 60 MG CPEP: 60 | 30 days supply | Qty: 30 | Fill #1

## 2017-07-07 MED FILL — METFORMIN HCL ER 500 MG TAB: 500 | 30 days supply | Qty: 30 | Fill #2

## 2017-07-08 ENCOUNTER — Ambulatory Visit (INDEPENDENT_AMBULATORY_CARE_PROVIDER_SITE_OTHER): Payer: Medicaid Other | Admitting: Neurology

## 2017-07-08 DIAGNOSIS — G4720 Circadian rhythm sleep disorder, unspecified type: Secondary | ICD-10-CM

## 2017-07-08 DIAGNOSIS — G4733 Obstructive sleep apnea (adult) (pediatric): Secondary | ICD-10-CM

## 2017-07-08 DIAGNOSIS — Z6841 Body Mass Index (BMI) 40.0 and over, adult: Secondary | ICD-10-CM

## 2017-07-08 DIAGNOSIS — G40909 Epilepsy, unspecified, not intractable, without status epilepticus: Secondary | ICD-10-CM

## 2017-07-08 DIAGNOSIS — G4719 Other hypersomnia: Secondary | ICD-10-CM

## 2017-07-15 ENCOUNTER — Telehealth: Payer: Self-pay

## 2017-07-15 NOTE — Telephone Encounter (Signed)
-----   Message from Star Age, MD sent at 07/15/2017  8:24 AM EDT ----- Patient referred by CM, seen by me on 04/29/17, diagnostic PSG on 05/27/17. Patient had a CPAP titration study on 07/08/17.  Please call and inform patient that I have entered an order for treatment with positive airway pressure (PAP) treatment for obstructive sleep apnea (OSA). She did well during the latest sleep study with CPAP. We will, therefore, arrange for a machine for home use through a DME (durable medical equipment) company of Her choice; and I will see the patient back in follow-up in about 10 weeks. Please also explain to the patient that I will be looking out for compliance data, which can be downloaded from the machine (stored on an SD card, that is inserted in the machine) or via remote access through a modem, that is built into the machine. At the time of the followup appointment we will discuss sleep study results and how it is going with PAP treatment at home. Please advise patient to bring Her machine at the time of the first FU visit, even though this is cumbersome. Bringing the machine for every visit after that will likely not be needed, but often helps for the first visit to troubleshoot if needed. Please re-enforce the importance of compliance with treatment and the need for [place] to monitor compliance data - often an insurance requirement and actually good feedback for the patient as far as how they are doing.  Also remind patient, that any interim PAP machine or mask issues should be first addressed with the DME company, as they can often help better with technical and mask fit issues. Please ask if patient has a preference regarding DME company.  Please also make sure, the patient has a follow-up appointment with me in about 10 weeks from the setup date, thanks. May see one of our nurse practitioners if needed for proper timing of the FU appointment.  Please fax or rout report to the referring provider. Thanks,    Star Age, MD, PhD [name])

## 2017-07-15 NOTE — Telephone Encounter (Signed)
Note created in error.

## 2017-07-15 NOTE — Procedures (Signed)
PATIENT'S NAME:  [name], [name] DOB:      03/09/1961      MR#:    [id number]     DATE OF RECORDING: 07/08/2017 REFERRING M.D.:  Dennie Bible, NP Study Performed:   CPAP  Titration HISTORY: 57 year old woman with a history of intracranial hemorrhage, seizure disorder, iron deficiency anemia, hypertension, hyperlipidemia, depression, and morbid obesity, who returns for a full night CPAP titration. Her baseline PSG on 05/27/17 showed severe obstructive sleep apnea, with a total AHI of 67.1/hour, REM AHI of 66.4/hour, and O2 nadir of 58%. The patient's weight 280 pounds with a height of 59 (inches), resulting in a BMI of 56.4 kg/m2. The patient's neck circumference measured 20 inches.  CURRENT MEDICATIONS: Acetaminophen, Aspirin, Atorvastatin, Cetirizine, Chlorthalidone, Diclofenac Sodium, Diphenhydramine, Duloxetine, Fluoxetine, Fluticasone, Hydroxypropyl, Levetiracetam, Metformin, Methocarbamol and Metoprolol tartrate.  PROCEDURE:  This is a multichannel digital polysomnogram utilizing the SomnoStar 11.2 system.  Electrodes and sensors were applied and monitored per AASM Specifications.   EEG, EOG, Chin and Limb EMG, were sampled at 200 Hz.  ECG, Snore and Nasal Pressure, Thermal Airflow, Respiratory Effort, CPAP Flow and Pressure, Oximetry was sampled at 50 Hz. Digital video and audio were recorded.      The patient was fitted with medium nasal pillows, P10. CPAP was initiated at 5 cmH20 with heated humidity per AASM standards and pressure was advanced to 12cm H20 because of hypopneas, apneas and desaturations.  At a PAP pressure of 12 cmH20, there was a reduction of the AHI to 0/hour with non-supine REM sleep achieved and O2 nadir of 92% (81% reported was erroneous due to movement).    Lights Out was at 00:02 and Lights On at 07:39. Total recording time (TRT) was 458 minutes, with a total sleep time (TST) of 387.5 minutes. The patient's sleep latency was 89.5 minutes, which is delayed. REM latency  was 95.5 minutes, which is normal. The sleep efficiency was 84.6%.    SLEEP ARCHITECTURE: WASO (Wake after sleep onset) was 47.5 minutes with moderate sleep fragmentation noted during the 1st third of the study. There were 23.5 minutes in Stage N1, 249 minutes Stage N2, 0 minutes Stage N3 and 115 minutes in Stage REM.  The percentage of Stage N1 was 6.1%, Stage N2 was 64.3%, which is increased, Stage N3 was absent, and Stage R (REM sleep) was 29.7%, which is increased, and in keeping with rebound. The arousals were noted as: 40 were spontaneous, 0 were associated with PLMs, 146 were associated with respiratory events.  Audio and video analysis did not show any abnormal or unusual movements, behaviors, phonations or vocalizations. The patient took no bathroom breaks. The EKG was in keeping with normal sinus rhythm (NSR).  RESPIRATORY ANALYSIS:  There was a total of 147 respiratory events: 0 obstructive apneas, 0 central apneas and 0 mixed apneas with a total of 0 apneas and an apnea index (AI) of 0 /hour. There were 147 hypopneas with a hypopnea index of 22.8/hour. The patient also had 1 respiratory event related arousals (RERAs).     The total APNEA/HYPOPNEA INDEX  (AHI) was 22.8 /hour and the total RESPIRATORY DISTURBANCE INDEX was 22.9 .hour  34 events occurred in REM sleep and 113 events in NREM. The REM AHI was 17.7 /hour versus a non-REM AHI of 24.9 /hour.  The patient spent 16.5 minutes of total sleep time in the supine position and 371 minutes in non-supine. The supine AHI was 0.0, versus a non-supine AHI of 23.8.  OXYGEN SATURATION & C02:  The baseline 02 saturation was 90%, with the lowest being 81%. Time spent below 89% saturation equaled 29 minutes.  PERIODIC LIMB MOVEMENTS: The patient had a total of 0 Periodic Limb Movements. The Periodic Limb Movement (PLM) index was 0 and the PLM Arousal index was 0 /hour.  Post-study, the patient indicated that sleep was better than usual.    IMPRESSION:   1. Obstructive Sleep Apnea (OSA) 2. Dysfunctions associated with sleep stages or arousal from sleep   RECOMMENDATIONS:   1. This study demonstrates resolution of the patient's obstructive sleep apnea with CPAP therapy. I will, therefore, start the patient on home CPAP treatment at a pressure of 12 cm via medium nasal pillows with heated humidity. The patient should be reminded to be fully compliant with PAP therapy to improve sleep related symptoms and decrease long term cardiovascular risks. The patient should be reminded, that it may take up to 3 months to get fully used to using PAP with all planned sleep. The earlier full compliance is achieved, the better long term compliance tends to be. Please note that untreated obstructive sleep apnea carries additional perioperative morbidity. Patients with significant obstructive sleep apnea should receive perioperative PAP therapy and the surgeons and particularly the anesthesiologist should be informed of the diagnosis and the severity of the sleep disordered breathing. 2. This study shows sleep fragmentation and abnormal sleep stage percentages; these are nonspecific findings and per se do not signify an intrinsic sleep disorder or a cause for the patient's sleep-related symptoms. Causes include (but are not limited to) the first night effect of the sleep study, circadian rhythm disturbances, medication effect or an underlying mood disorder or medical problem.  3. The patient should be cautioned not to drive, work at heights, or operate dangerous or heavy equipment when tired or sleepy. Review and reiteration of good sleep hygiene measures should be pursued with any patient. 4. The patient will be seen in follow-up by Dr. Rexene Alberts at [facility] for discussion of the test results and further management strategies. The referring provider will be notified of the test results.   I certify that I have reviewed the entire raw data recording prior to the  issuance of this report in accordance with the Standards of Accreditation of the American Academy of Sleep Medicine (AASM)   Star Age, MD, PhD Diplomat, American Board of Psychiatry and Neurology (Neurology and Sleep Medicine)

## 2017-07-15 NOTE — Telephone Encounter (Signed)
I called pt. I advised pt that Dr. Rexene Alberts reviewed their sleep study results and found that pt did well during her latest sleep study with cpap. Dr. Rexene Alberts recommends that pt start a cpap at home. I reviewed PAP compliance expectations with the pt. Pt is agreeable to starting a CPAP. I advised pt that an order will be sent to a DME, [facility], and [facility] will call the pt within about one week after they file with the pt's insurance. [facility] will show the pt how to use the machine, fit for masks, and troubleshoot the CPAP if needed. A follow up appt was made for insurance purposes with Dr. Rexene Alberts on 10/08/17 at 2:00pm. Pt verbalized understanding to arrive 15 minutes early and bring their CPAP. A letter with all of this information in it will be mailed to the pt as a reminder. I verified with the pt that the address we have on file is correct. Pt verbalized understanding of results. Pt had no questions at this time but was encouraged to call back if questions arise.

## 2017-07-15 NOTE — Progress Notes (Signed)
Patient referred by CM, seen by me on 04/29/17, diagnostic PSG on 05/27/17. Patient had a CPAP titration study on 07/08/17.  Please call and inform patient that I have entered an order for treatment with positive airway pressure (PAP) treatment for obstructive sleep apnea (OSA). She did well during the latest sleep study with CPAP. We will, therefore, arrange for a machine for home use through a DME (durable medical equipment) company of Her choice; and I will see the patient back in follow-up in about 10 weeks. Please also explain to the patient that I will be looking out for compliance data, which can be downloaded from the machine (stored on an SD card, that is inserted in the machine) or via remote access through a modem, that is built into the machine. At the time of the followup appointment we will discuss sleep study results and how it is going with PAP treatment at home. Please advise patient to bring Her machine at the time of the first FU visit, even though this is cumbersome. Bringing the machine for every visit after that will likely not be needed, but often helps for the first visit to troubleshoot if needed. Please re-enforce the importance of compliance with treatment and the need for [place] to monitor compliance data - often an insurance requirement and actually good feedback for the patient as far as how they are doing.  Also remind patient, that any interim PAP machine or mask issues should be first addressed with the DME company, as they can often help better with technical and mask fit issues. Please ask if patient has a preference regarding DME company.  Please also make sure, the patient has a follow-up appointment with me in about 10 weeks from the setup date, thanks. May see one of our nurse practitioners if needed for proper timing of the FU appointment.  Please fax or rout report to the referring provider. Thanks,   Star Age, MD, PhD [name])

## 2017-07-15 NOTE — Addendum Note (Signed)
Addended by: Star Age on: 07/15/2017 08:24 AM   Modules accepted: Orders

## 2017-07-16 ENCOUNTER — Ambulatory Visit: Payer: Medicaid Other | Attending: Family Medicine | Admitting: Family Medicine

## 2017-07-16 ENCOUNTER — Encounter: Payer: Self-pay | Admitting: Family Medicine

## 2017-07-16 VITALS — BP 125/74 | HR 75 | Temp 98.0°F | Ht 59.0 in | Wt 287.4 lb

## 2017-07-16 DIAGNOSIS — D509 Iron deficiency anemia, unspecified: Secondary | ICD-10-CM | POA: Diagnosis not present

## 2017-07-16 DIAGNOSIS — Z888 Allergy status to other drugs, medicaments and biological substances status: Secondary | ICD-10-CM | POA: Insufficient documentation

## 2017-07-16 DIAGNOSIS — Z8673 Personal history of transient ischemic attack (TIA), and cerebral infarction without residual deficits: Secondary | ICD-10-CM | POA: Insufficient documentation

## 2017-07-16 DIAGNOSIS — R42 Dizziness and giddiness: Secondary | ICD-10-CM | POA: Diagnosis not present

## 2017-07-16 DIAGNOSIS — R7303 Prediabetes: Secondary | ICD-10-CM | POA: Diagnosis not present

## 2017-07-16 DIAGNOSIS — I69251 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 dominant side: Secondary | ICD-10-CM | POA: Insufficient documentation

## 2017-07-16 DIAGNOSIS — Z79899 Other long term (current) drug therapy: Secondary | ICD-10-CM | POA: Insufficient documentation

## 2017-07-16 DIAGNOSIS — R040 Epistaxis: Secondary | ICD-10-CM | POA: Diagnosis not present

## 2017-07-16 DIAGNOSIS — I10 Essential (primary) hypertension: Secondary | ICD-10-CM | POA: Insufficient documentation

## 2017-07-16 MED ORDER — MECLIZINE HCL 25 MG PO TABS: 25.0000 mg | ORAL_TABLET | Freq: Two times a day (BID) | ORAL | 1 refills | Status: DC | PRN

## 2017-07-16 MED FILL — CHLORTHALIDONE 25 MG TAB: 25 | 30 days supply | Qty: 30 | Fill #1

## 2017-07-16 MED FILL — MECLIZINE 25 MG TABLET: 25 | 30 days supply | Qty: 60 | Fill #0

## 2017-07-16 MED FILL — ATORVASTATIN 20 MG TABLET: 20 | 30 days supply | Qty: 30 | Fill #1

## 2017-07-16 NOTE — Patient Instructions (Signed)

## 2017-07-16 NOTE — Progress Notes (Signed)
Subjective:  Patient ID: [name], female    DOB: 1961-01-19  Age: 57 y.o. 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  She describes this as the room is spinning and had to lie down to feel better and symptoms resolved after her nap, dizziness also occurs on turning of her head.  She has not had a repeat of symptoms. She endorses intermittent pain in different aspects of her head but none at this time and had a recent ED visit for epistaxis on 06/13/17 and has had no repeat episodes since then. She takes Flonase and Zyrtec consistently.  Her chronic medical conditions are stable and she saw her physical medicine and rehab physician last month and is contemplating aquatic therapy due to her balance issues and the fact that she is scared.  I have advised her to speak with a trainer  at the [facility] express her concerns and they would be willing to work with her.  Past Medical History:  Diagnosis Date  . Headache   . Hypertension   . Iron deficiency anemia   . Seizures ([place])   . Stroke [facility])     Past Surgical History:  Procedure Laterality Date  . CARDIAC CATHETERIZATION  04/2010   Dr. Terrence Dupont    Allergies  Allergen Reactions  . Bee Venom Swelling    Facial swelling  . Eggs Or Egg-Derived Products Nausea And Vomiting and Swelling    Mouth swelled  . Gabapentin     Jittery feeling, dyspnea, crawling sensation  . Gadolinium Derivatives Itching    Immediately after gad injection pt. complained of tongue numbness and lower lip/ then had rt side of body tingling/ pt. Was assessed by Dr. Jeralyn Ruths before leaving/ no medications were administered/      Past Surgical History:    Procedure Laterality Date  . CARDIAC CATHETERIZATION  04/2010   Dr. Terrence Dupont    Allergies  Allergen Reactions  . Bee Venom Swelling    Facial swelling  . Eggs Or Egg-Derived Products Nausea And Vomiting and Swelling    Mouth swelled  . Gabapentin     Jittery feeling, dyspnea, crawling sensation  . Gadolinium Derivatives Itching    Immediately after gad injection pt. complained of tongue numbness and lower lip/ then had rt side of body tingling/ pt. Was assessed by Dr. Jeralyn Ruths before leaving/ no medications were administered/     Outpatient Medications Prior to Visit  Medication Sig Dispense Refill  . acetaminophen (TYLENOL) 500 MG tablet Take 500 mg by mouth every 6 (six) hours as needed for headache (pain).    Marland Kitchen atorvastatin (LIPITOR) 20 MG tablet Take 1 tablet (20 mg total) by mouth daily. 30 tablet 3  . cetirizine (ZYRTEC) 10 MG tablet Take 10 mg by mouth daily as needed (allergic reaction).    . chlorthalidone (HYGROTON) 25 MG tablet Take 1 tablet (25 mg total) by mouth daily. 30 tablet 3  . diclofenac sodium (VOLTAREN) 1 % GEL Apply 2 g topically 4 (four) times daily. 1 Tube 3  . diphenhydrAMINE (BENADRYL) 25 MG tablet Take 25 mg by mouth daily as needed for allergies (bee stings).    . DULoxetine (CYMBALTA) 60 MG capsule TAKE 1  CAPSULE DAILY BY MOUTH. 30 capsule 1  . DULoxetine (CYMBALTA) 60 MG capsule TAKE 1 CAPSULE DAILY BY MOUTH. 30 capsule 1  . fluticasone (FLONASE) 50 MCG/ACT nasal spray Place 1 spray into both nostrils daily as needed for allergies or rhinitis. 16 g 1  . hydroxypropyl methylcellulose / hypromellose (ISOPTO TEARS / GONIOVISC) 2.5 % ophthalmic solution Place 2 drops into both eyes as needed (irritation). 15 mL 1  . levETIRAcetam (KEPPRA) 500 MG tablet TAKE 1 & 1/2 TABLETS BY MOUTH TWICE DAILY. (Patient taking differently: Take 750 mg by mouth 2 (two) times daily. ) 90 tablet 8  . metFORMIN (GLUCOPHAGE XR) 500 MG 24 hr tablet Take 1 tablet (500 mg total) by  mouth daily with breakfast. 90 tablet 3  . methocarbamol (ROBAXIN) 750 MG tablet TAKE 1 TABLET BY MOUTH 2 TIMES DAILY AS NEEDED FOR MUSCLE SPASMS. 60 tablet 1  . metoprolol tartrate (LOPRESSOR) 100 MG tablet Take 1 tablet (100 mg total) by mouth 2 (two) times daily. 60 tablet 3   No facility-administered medications prior to visit.     ROS Review of Systems  Constitutional: Negative for activity change, appetite change and fatigue.  HENT: Negative for congestion, sinus pressure and sore throat.   Eyes: Negative for visual disturbance.  Respiratory: Negative for cough, chest tightness, shortness of breath and wheezing.   Cardiovascular: Negative for chest pain and palpitations.  Gastrointestinal: Negative for abdominal distention, abdominal pain and constipation.  Endocrine: Negative for polydipsia.  Genitourinary: Negative for dysuria and frequency.  Musculoskeletal: Negative for arthralgias and back pain.  Skin: Negative for rash.  Neurological: Positive for weakness and light-headedness. Negative for tremors and numbness.  Hematological: Does not bruise/bleed easily.  Psychiatric/Behavioral: Negative for agitation and behavioral problems.    Objective:  BP 125/74   Pulse 75   Temp 98 F (36.7 C) (Oral)   Ht 4\' 11"  (1.499 m)   Wt 287 lb 6.4 oz (130.4 kg)   LMP 02/17/2011   SpO2 98%   BMI 58.05 kg/m   BP/Weight 07/16/2017 5/40/9811 11/23/4780  Systolic BP 956 213 086  Diastolic BP 74 60 78  Wt. (Lbs) 287.4 - -  BMI 58.05 - -      Physical Exam  Constitutional: She is oriented to person, place, and time. She appears well-developed and well-nourished.  HENT:  Right Ear: External ear normal.  Left Ear: External ear normal.  Mouth/Throat: Oropharynx is clear and moist.  Cardiovascular: Normal rate, normal heart sounds and intact distal pulses.  No murmur heard. Pulmonary/Chest: Effort normal and breath sounds normal. She has no wheezes. She has no rales. She exhibits no  tenderness.  Abdominal: Soft. Bowel sounds are normal. She exhibits no distension and no mass. There is no tenderness.  Musculoskeletal: Normal range of motion. She exhibits tenderness (R shoulder TTP).  Reduced strength in right upper and right lower extremities  Neurological: She is alert and oriented to person, place, and time.  Skin: Skin is warm and dry.     Assessment & Plan:   1. Vertigo Advised to change positions slowly - meclizine (ANTIVERT) 25 MG tablet; Take 1 tablet (25 mg total) by mouth 2 (two) times daily as needed for dizziness.  Dispense: 60 tablet; Refill: 1  2. History of stroke Risk factor modifications High risk for falls Completed physical therapy   Meds ordered this encounter  Medications  . meclizine (ANTIVERT) 25 MG tablet    Sig: Take 1 tablet (25 mg total) by  mouth 2 (two) times daily as needed for dizziness.    Dispense:  60 tablet    Refill:  1    Follow-up: Return in about 3 months (around 10/15/2017) for Follow up of chronic medical conditions.   Charlott Rakes MD

## 2017-07-29 MED FILL — levETIRAcetam 500 MG TABS: 500 | 30 days supply | Qty: 90 | Fill #1

## 2017-07-29 MED FILL — METOPROLOL TARTRATE 100 MG: 100 | 30 days supply | Qty: 60 | Fill #1

## 2017-08-08 MED FILL — METHOCARBAMOL 750 MG TABS: 750 | 30 days supply | Qty: 60 | Fill #1

## 2017-08-15 ENCOUNTER — Other Ambulatory Visit: Payer: Self-pay | Admitting: Physical Medicine & Rehabilitation

## 2017-08-15 MED FILL — METFORMIN HCL ER 500 MG TAB: 500 | 30 days supply | Qty: 30 | Fill #3

## 2017-08-15 MED FILL — DULoxetine HCL 60 MG CPEP: 60 | 30 days supply | Qty: 30 | Fill #0

## 2017-08-15 MED FILL — CHLORTHALIDONE 25 MG TAB: 25 | 30 days supply | Qty: 30 | Fill #2

## 2017-08-28 ENCOUNTER — Encounter: Payer: Medicaid Other | Admitting: Physical Medicine & Rehabilitation

## 2017-08-29 ENCOUNTER — Other Ambulatory Visit: Payer: Self-pay

## 2017-08-29 ENCOUNTER — Encounter: Payer: Medicaid Other | Attending: Physical Medicine & Rehabilitation | Admitting: Physical Medicine & Rehabilitation

## 2017-08-29 ENCOUNTER — Encounter: Payer: Self-pay | Admitting: Physical Medicine & Rehabilitation

## 2017-08-29 VITALS — BP 151/94 | HR 55 | Ht 59.0 in | Wt 287.0 lb

## 2017-08-29 DIAGNOSIS — G8929 Other chronic pain: Secondary | ICD-10-CM

## 2017-08-29 DIAGNOSIS — Z5189 Encounter for other specified aftercare: Secondary | ICD-10-CM | POA: Insufficient documentation

## 2017-08-29 DIAGNOSIS — R29818 Other symptoms and signs involving the nervous system: Secondary | ICD-10-CM | POA: Diagnosis not present

## 2017-08-29 DIAGNOSIS — I69249 Monoplegia of lower limb following other nontraumatic intracranial hemorrhage affecting unspecified side: Secondary | ICD-10-CM | POA: Insufficient documentation

## 2017-08-29 DIAGNOSIS — M25511 Pain in right shoulder: Secondary | ICD-10-CM

## 2017-08-29 DIAGNOSIS — R208 Other disturbances of skin sensation: Secondary | ICD-10-CM

## 2017-08-29 DIAGNOSIS — M62838 Other muscle spasm: Secondary | ICD-10-CM | POA: Insufficient documentation

## 2017-08-29 DIAGNOSIS — I10 Essential (primary) hypertension: Secondary | ICD-10-CM

## 2017-08-29 DIAGNOSIS — F329 Major depressive disorder, single episode, unspecified: Secondary | ICD-10-CM

## 2017-08-29 DIAGNOSIS — R7303 Prediabetes: Secondary | ICD-10-CM | POA: Insufficient documentation

## 2017-08-29 DIAGNOSIS — I629 Nontraumatic intracranial hemorrhage, unspecified: Secondary | ICD-10-CM | POA: Diagnosis not present

## 2017-08-29 DIAGNOSIS — M7541 Impingement syndrome of right shoulder: Secondary | ICD-10-CM | POA: Diagnosis not present

## 2017-08-29 DIAGNOSIS — R269 Unspecified abnormalities of gait and mobility: Secondary | ICD-10-CM | POA: Diagnosis not present

## 2017-08-29 DIAGNOSIS — R569 Unspecified convulsions: Secondary | ICD-10-CM | POA: Insufficient documentation

## 2017-08-29 DIAGNOSIS — I69398 Other sequelae of cerebral infarction: Secondary | ICD-10-CM | POA: Diagnosis not present

## 2017-08-29 DIAGNOSIS — I611 Nontraumatic intracerebral hemorrhage in hemisphere, cortical: Secondary | ICD-10-CM | POA: Diagnosis not present

## 2017-08-29 NOTE — Progress Notes (Addendum)
Subjective:    Patient ID: [name], female    DOB: 02-02-1961, 57 y.o.   MRN: [id number]  HPI  57 year old RH-female with history of morbid obesity, HTN, iron deficiency anemia who presents for follow up after right temporal intracranial hemorrhage.  Last clinic visit 05/28/17.  Since that time, pt went to the ED for epistaxis, notes reviewed, self-resolved.  She still has not gone to [facility], states she does not know why.  Pt states numbness on right side for 2 weeks.  She also notes weakness and difficulty with ambulation during that time.  Denies falls. Her shoulder pain has improved.  Pain Inventory Average Pain  7 Pain Right Now 6 My pain is burning, tingling and aching  In the last 24 hours, has pain interfered with the following? General activity 4 Relation with others 4 Enjoyment of life 4 What TIME of day is your pain at its worst? evening and night Sleep (in general) Fair  Pain is worse with: walking, standing and some activites Pain improves with: heat/ice and medication Relief from Meds: 5  Mobility walk with assistance use a cane ability to climb steps?  yes do you drive?  yes Do you have any goals in this area?  yes  Function disabled: date disabled 09/2015  Neuro/Psych trouble walking dizziness  Prior Studies Any changes since last visit?  no  Physicians involved in your care Any changes since last visit?  no   Family History  Problem Relation Age of Onset  . Diabetes Father   . Hypertension Father   . Hypertension Sister   . Stroke Brother    Social History   Socioeconomic History  . Marital status: Married    Spouse name: Not on file  . Number of children: Not on file  . Years of education: Not on file  . Highest education level: Not on file  Occupational History  . Not on file  Social Needs  . Financial resource strain: Not on file  . Food insecurity:    Worry: Not on file    Inability: Not on file  . Transportation needs:   Medical: Not on file    Non-medical: Not on file  Tobacco Use  . Smoking status: Never Smoker  . Smokeless tobacco: Never Used  Substance and Sexual Activity  . Alcohol use: No  . Drug use: No  . Sexual activity: Not on file  Lifestyle  . Physical activity:    Days per week: Not on file    Minutes per session: Not on file  . Stress: Not on file  Relationships  . Social connections:    Talks on phone: Not on file    Gets together: Not on file    Attends religious service: Not on file    Active member of club or organization: Not on file    Attends meetings of clubs or organizations: Not on file    Relationship status: Not on file  Other Topics Concern  . Not on file  Social History Narrative  . Not on file   Past Surgical History:  Procedure Laterality Date  . CARDIAC CATHETERIZATION  04/2010   Dr. Terrence Dupont   Past Medical History:  Diagnosis Date  . Headache   . Hypertension   . Iron deficiency anemia   . Seizures ([place])   . Stroke ([place])    BP (!) 151/94   Pulse (!) 55   Ht 4\' 11"  (1.499 m)  Wt 287 lb (130.2 kg)   LMP 02/17/2011   BMI 57.97 kg/m   Opioid Risk Score:   Fall Risk Score:  `1  Depression screen PHQ 2/9  Depression screen [facility] 2/9 08/29/2017 07/16/2017 05/23/2017 02/27/2017 10/10/2016 09/10/2016 06/13/2016  Decreased Interest 0 1 2 1 1 2 3   Down, Depressed, Hopeless 0 1 2 1 1 1 1   PHQ - 2 Score 0 2 4 2 2 3 4   Altered sleeping - 2 2 - - 2 -  Tired, decreased energy - 1 2 - - 1 -  Change in appetite - 1 1 - - 1 -  Feeling bad or failure about yourself  - 1 2 - - 2 -  Trouble concentrating - 0 (No Data) - - 1 -  Moving slowly or fidgety/restless - 0 1 - - 2 -  Suicidal thoughts - 0 (No Data) - - 0 -  PHQ-9 Score - 7 12 - - 12 -  Difficult doing work/chores - - - - - - -  Some recent data might be hidden    Review of Systems  Constitutional: Positive for unexpected weight change.       Bladder control problems   HENT: Negative.   Eyes: Negative.     Respiratory: Positive for shortness of breath and wheezing.   Cardiovascular: Positive for leg swelling.  Gastrointestinal: Negative.   Endocrine: Negative.   Genitourinary: Negative.   Musculoskeletal: Positive for arthralgias and gait problem.       Spasms   Skin: Negative.   Neurological: Positive for dizziness and weakness.       Tingling    Hematological: Negative.   Psychiatric/Behavioral: Positive for confusion and dysphoric mood.  All other systems reviewed and are negative.     Objective:   Physical Exam Constitutional: She appears well-developed and well-nourished. NAD. Morbidly obese.  HENT: Normocephalic and atraumatic.  Eyes: EOMI. No discharge.  Cardiovascular: RRR. No JVD. Respiratory: Effort normal and breath sounds normal.  GI: Soft. Bowel sounds are normal.  Musculoskeletal:  Gait: Slow cadence No edema. +Mild TTP right shoulder. Neurological: She is alert and oriented.  Motor: RUE: 4/5 throughout. RLE: 4+-/5 HF, 4/5 distally Sensation intact to light touch Skin: Skin is warm and dry. Intact.  Psychiatric: She has a normal mood and affect.      Assessment & Plan:  57 year old RH-female with history of morbid obesity, HTN, iron deficiency anemia who presents for follow up after right temporal intracranial hemorrhage.  1.S/p right temporal intracranial hemorrhage with dysesthesias  Side effects with gabapentin             Aquatic therapy denied by insurance, encouraged follow up at [facility] x4  Cont meds  Cont Cymbalta   Patient with increased weakness and sensory deficits on RUE/RLL - will order MRI. Instructed to present to ED if symptoms become worse              2. Depression     Reaction to Prozaac  Pt states she is managing with her chaplain and scriptures  Cont duloxetine.  3. Seizures             Cont meds  4. Morbid obesity  Cont follow up with dietitian             Cont to encourage weight loss again, no improvement, has not tried  anything  5. Muscle spasms  Encouraged ROM and stretching  Cont Robaxin 750 BID PRN  6. Abnormality of gait - post stroke             Cont cane  Therapies completed, cont HEP  7. Right shoulder pain             Xrays reviewed previously with some degenerative change right shoulder             Cont Voltaren gel   Will consider steroid injection in future, she still does not want at present  Cont Voltaren gel  Improved  8. Right leg pain  See #5, #7  9. Falls  In sleep  Encouraged pt to push bed against wall or place furniture against bed to limit chances of rolling onto floor  Improving  10. HTN  Pt states due to RLE pain per pt, states otherwise WNL

## 2017-09-01 MED FILL — MECLIZINE 25 MG TABLET: 25 | 30 days supply | Qty: 60 | Fill #1

## 2017-09-01 MED FILL — levETIRAcetam 500 MG TABS: 500 | 30 days supply | Qty: 90 | Fill #2

## 2017-09-01 MED FILL — ATORVASTATIN 20 MG TABLET: 20 | 30 days supply | Qty: 30 | Fill #2

## 2017-09-05 MED FILL — METOPROLOL TARTRATE 100 MG: 100 | 30 days supply | Qty: 60 | Fill #2

## 2017-09-22 MED FILL — DULoxetine HCL 60 MG CPEP: 60 | 30 days supply | Qty: 30 | Fill #1

## 2017-09-22 MED FILL — CHLORTHALIDONE 25 MG TAB: 25 | 30 days supply | Qty: 30 | Fill #3

## 2017-09-29 MED FILL — METFORMIN HCL ER 500 MG TAB: 500 | 30 days supply | Qty: 30 | Fill #4

## 2017-09-30 ENCOUNTER — Ambulatory Visit (HOSPITAL_COMMUNITY)
Admission: RE | Admit: 2017-09-30 | Discharge: 2017-09-30 | Disposition: A | Discharge status: Home / Self Care | Payer: Medicaid Other | Source: Ambulatory Visit | Attending: Physical Medicine & Rehabilitation | Admitting: Physical Medicine & Rehabilitation

## 2017-09-30 DIAGNOSIS — R29818 Other symptoms and signs involving the nervous system: Secondary | ICD-10-CM

## 2017-09-30 MED FILL — ATORVASTATIN 20 MG TABLET: 20 | 30 days supply | Qty: 30 | Fill #3

## 2017-10-01 ENCOUNTER — Encounter: Payer: Medicaid Other | Attending: Physical Medicine & Rehabilitation | Admitting: Physical Medicine & Rehabilitation

## 2017-10-01 ENCOUNTER — Other Ambulatory Visit: Payer: Self-pay | Admitting: Physical Medicine & Rehabilitation

## 2017-10-01 DIAGNOSIS — R569 Unspecified convulsions: Secondary | ICD-10-CM | POA: Insufficient documentation

## 2017-10-01 DIAGNOSIS — R7303 Prediabetes: Secondary | ICD-10-CM | POA: Insufficient documentation

## 2017-10-01 DIAGNOSIS — I10 Essential (primary) hypertension: Secondary | ICD-10-CM | POA: Insufficient documentation

## 2017-10-01 DIAGNOSIS — R269 Unspecified abnormalities of gait and mobility: Secondary | ICD-10-CM | POA: Insufficient documentation

## 2017-10-01 DIAGNOSIS — F329 Major depressive disorder, single episode, unspecified: Secondary | ICD-10-CM | POA: Insufficient documentation

## 2017-10-01 DIAGNOSIS — I629 Nontraumatic intracranial hemorrhage, unspecified: Secondary | ICD-10-CM | POA: Insufficient documentation

## 2017-10-01 DIAGNOSIS — Z5189 Encounter for other specified aftercare: Secondary | ICD-10-CM | POA: Insufficient documentation

## 2017-10-01 DIAGNOSIS — M62838 Other muscle spasm: Secondary | ICD-10-CM | POA: Insufficient documentation

## 2017-10-01 DIAGNOSIS — I69249 Monoplegia of lower limb following other nontraumatic intracranial hemorrhage affecting unspecified side: Secondary | ICD-10-CM | POA: Insufficient documentation

## 2017-10-01 DIAGNOSIS — R29818 Other symptoms and signs involving the nervous system: Secondary | ICD-10-CM

## 2017-10-03 ENCOUNTER — Telehealth: Payer: Self-pay | Admitting: *Deleted

## 2017-10-03 NOTE — Telephone Encounter (Signed)
Patient called to confirm appointment.  I contacted patient and informed that she missed her appointment on Jul 10th, 2019.  Patient will call to reschedule after MRI is completed

## 2017-10-06 ENCOUNTER — Encounter: Payer: Self-pay | Admitting: Neurology

## 2017-10-06 ENCOUNTER — Ambulatory Visit (HOSPITAL_COMMUNITY)
Admission: RE | Admit: 2017-10-06 | Discharge: 2017-10-06 | Disposition: A | Discharge status: Home / Self Care | Payer: Medicaid Other | Source: Ambulatory Visit | Attending: Physical Medicine & Rehabilitation | Admitting: Physical Medicine & Rehabilitation

## 2017-10-06 DIAGNOSIS — G319 Degenerative disease of nervous system, unspecified: Secondary | ICD-10-CM | POA: Insufficient documentation

## 2017-10-06 DIAGNOSIS — R29818 Other symptoms and signs involving the nervous system: Secondary | ICD-10-CM | POA: Diagnosis not present

## 2017-10-06 DIAGNOSIS — I739 Peripheral vascular disease, unspecified: Secondary | ICD-10-CM | POA: Insufficient documentation

## 2017-10-07 MED FILL — METOPROLOL TARTRATE 100 MG: 100 | 30 days supply | Qty: 60 | Fill #3

## 2017-10-07 MED FILL — levETIRAcetam 500 MG TABS: 500 | 30 days supply | Qty: 90 | Fill #3

## 2017-10-08 ENCOUNTER — Ambulatory Visit: Payer: Medicaid Other | Admitting: Neurology

## 2017-10-08 ENCOUNTER — Encounter: Payer: Self-pay | Admitting: Neurology

## 2017-10-08 VITALS — BP 138/86 | HR 73 | Ht 59.0 in | Wt 292.0 lb

## 2017-10-08 DIAGNOSIS — G4733 Obstructive sleep apnea (adult) (pediatric): Secondary | ICD-10-CM | POA: Diagnosis not present

## 2017-10-08 DIAGNOSIS — Z9989 Dependence on other enabling machines and devices: Secondary | ICD-10-CM

## 2017-10-08 NOTE — Progress Notes (Signed)
Subjective:    Patient ID: [name] is a 57 y.o. female.  HPI     Interim history:   [name] is a 57 year old right-handed woman with an underlying medical history of intracranial hemorrhage, seizure disorder, iron deficiency anemia, hypertension, hyperlipidemia, depression, and morbid obesity with a BMI over 50, who presents for follow-up consultation of her sleep apnea, after sleep study testing. The patient is accompanied by her husband today. I first met her on 04/29/2017 at the request of Cecille Rubin, nurse practitioner, at which time the patient reported snoring and excessive daytime somnolence. [name] was advised to proceed with sleep study testing. [name] had a baseline sleep study, followed by a CPAP titration study. I went over her test results with her in detail today. Baseline sleep study from 05/27/2017 showed a reduced sleep efficiency at 57.6% and a prolonged sleep latency of 115.5 minutes. REM latency was markedly delayed at 274.5 minutes. [name] had absence of slow-wave sleep, increased percentage of stage II sleep and reduced percentage of REM sleep. Total AHI was highly elevated at 67.1 per hour. [name] did not achieve any supine sleep. Average oxygen saturation was 96%, nadir was 58%. [name] had no significant PLMS. Based on her medical history and sleep study results I advised her to proceed with a CPAP titration study. [name] had this on 07/08/2017. Sleep efficiency was 84.6%. REM latency was 95.5 minutes. [name] had an increased percentage of REM sleep in keeping with rebound. [name] was fitted with medium nasal pillows and CPAP was titrated from 5 cm to 12 cm. On the final pressure her AHI was 0 per hour with nonsupine REM sleep achieved an O2 nadir of 92%. [name] had no significant PLMS. Based on her test results I prescribed CPAP therapy for home use at a pressure of 12 cm.  Today, 10/08/2017: I reviewed her CPAP compliance data from 09/07/2017 through 10/06/2017 which is a total of 30 days,  during which time [name] used her CPAP every night with percent used days greater than 4 hours at 83%, indicating very good compliance with an average usage of 5 hours and 42 minutes, residual AHI suboptimal at 10.4 per hour, leak on the high side with the 95th percentile at 28.1 L/m on a pressure of 12 cm with EPR of 3. [name] reports [name] has had some recurrent headaches. [name] tried to get a different mask today but the respiratory therapist was not at the DME office today. [name] reports that [name] has noted some benefit including better sleep consolidation, better sleep quality, less nocturia but [name] is using a fullface mask and has noticed a leak which blows cold air into her face and [name] wakes up feeling very cold. [name] also tries to adjust the head gear and has had some pressure headaches from adjusting it too tightly. [name] now has a mask fit appointment on Monday. [name] is motivated to continue with treatment and also interested in working harder on weight loss. [name] has met with a nutritionist in the past.  The patient's allergies, current medications, family history, past medical history, past social history, past surgical history and problem list were reviewed and updated as appropriate.   Previously (copied from previous notes for reference):   04/29/2017: ([name]) reports snoring and excessive daytime somnolence. I reviewed your office note from 04/02/2017. Her Epworth sleepiness score is 15 out of 24 today, fatigue score is 50/63. [name] is a nonsmoker and does not drink alcohol, [name] drinks caffeine in the form  of tea, or [facility]. [name] is trying to reduce her soda intake, admits to not liking water but is trying to increase her water intake. [name] had an adenoidectomy as a child. [name] has gained weight in the past few years. [name] does not keep a very fit scheduled for her sleep and wake time. Generally in bed around midnight, wakeup time around 6:30 or so. [name] does not work. 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 [name] is familiar with CPAP therapy but would have concerns about a trying CPAP herself. [name] endorses being a restless sleeper. [name] has at times toward loudly and woken herself up with her snoring or gasping sensations. Her husband has noted positive in her breathing. [name] lives with her husband and her brother. [name] has 2 grown daughters. [name] has occasional morning headaches and has significant nocturia about 4 times per night on average. [name] is not aware of any family history of OSA.   Her Past Medical History Is Significant For: Past Medical History:  Diagnosis Date  . Headache   . Hypertension   . Iron deficiency anemia   . Seizures ([place])   . Stroke [facility])     Her Past Surgical History Is Significant For: Past Surgical History:  Procedure Laterality Date  . CARDIAC CATHETERIZATION  04/2010   Dr. Terrence Dupont    Her Family History Is Significant For: Family History  Problem Relation Age of Onset  . Diabetes Father   . Hypertension Father   . Hypertension Sister   . Stroke Brother     Her Social History Is Significant For: Social History   Socioeconomic History  . Marital status: Married    Spouse name: Not on file  . Number of children: Not on file  . Years of education: Not on file  . Highest education level: Not on file  Occupational History  . Not on file  Social Needs  . Financial resource strain: Not on file  . Food insecurity:    Worry: Not on file    Inability: Not on file  . Transportation needs:    Medical: Not on file    Non-medical: Not on file  Tobacco Use  . Smoking status: Never Smoker  . Smokeless tobacco: Never Used  Substance and Sexual Activity  . Alcohol use: No  . Drug use: No  . Sexual activity: Not on file  Lifestyle  . Physical activity:    Days per week: Not on file    Minutes per session: Not on file  . Stress: Not on file  Relationships  . Social connections:    Talks on phone: Not on file    Gets together: Not on file    Attends  religious service: Not on file    Active member of club or organization: Not on file    Attends meetings of clubs or organizations: Not on file    Relationship status: Not on file  Other Topics Concern  . Not on file  Social History Narrative  . Not on file    Her Allergies Are:  Allergies  Allergen Reactions  . Bee Venom Swelling    Facial swelling  . Eggs Or Egg-Derived Products Nausea And Vomiting and Swelling    Mouth swelled  . Gabapentin     Jittery feeling, dyspnea, crawling sensation  . Gadolinium Derivatives Itching    Immediately after gad injection pt. complained of tongue numbness and lower lip/ then had rt side  of body tingling/ pt. Was assessed by Dr. Jeralyn Ruths before leaving/ no medications were administered/  :   Her Current Medications Are:  Outpatient Encounter Medications as of 10/08/2017  Medication Sig  . acetaminophen (TYLENOL) 500 MG tablet Take 500 mg by mouth every 6 (six) hours as needed for headache (pain).  Marland Kitchen atorvastatin (LIPITOR) 20 MG tablet Take 1 tablet (20 mg total) by mouth daily.  . cetirizine (ZYRTEC) 10 MG tablet Take 10 mg by mouth daily as needed (allergic reaction).  . chlorthalidone (HYGROTON) 25 MG tablet Take 1 tablet (25 mg total) by mouth daily.  . diclofenac sodium (VOLTAREN) 1 % GEL Apply 2 g topically 4 (four) times daily.  . diphenhydrAMINE (BENADRYL) 25 MG tablet Take 25 mg by mouth daily as needed for allergies (bee stings).  . DULoxetine (CYMBALTA) 60 MG capsule TAKE 1 CAPSULE DAILY BY MOUTH.  . fluticasone (FLONASE) 50 MCG/ACT nasal spray Place 1 spray into both nostrils daily as needed for allergies or rhinitis.  . hydroxypropyl methylcellulose / hypromellose (ISOPTO TEARS / GONIOVISC) 2.5 % ophthalmic solution Place 2 drops into both eyes as needed (irritation).  Marland Kitchen levETIRAcetam (KEPPRA) 500 MG tablet TAKE 1 & 1/2 TABLETS BY MOUTH TWICE DAILY. (Patient taking differently: Take 750 mg by mouth 2 (two) times daily. )  . meclizine  (ANTIVERT) 25 MG tablet Take 1 tablet (25 mg total) by mouth 2 (two) times daily as needed for dizziness.  . metFORMIN (GLUCOPHAGE XR) 500 MG 24 hr tablet Take 1 tablet (500 mg total) by mouth daily with breakfast.  . methocarbamol (ROBAXIN) 750 MG tablet TAKE 1 TABLET BY MOUTH 2 TIMES DAILY AS NEEDED FOR MUSCLE SPASMS.  . metoprolol tartrate (LOPRESSOR) 100 MG tablet Take 1 tablet (100 mg total) by mouth 2 (two) times daily.   No facility-administered encounter medications on file as of 10/08/2017.   :  Review of Systems:  Out of a complete 14 point review of systems, all are reviewed and negative with the exception of these symptoms as listed below: Review of Systems  Neurological:       Pt presents today to discuss her cpap. Pt reports that her cpap is giving her headaches. [name] went to [employer] today for a mask re-fit but the RT was not available.    Objective:  Neurological Exam  Physical Exam Physical Examination:   Vitals:   10/08/17 1355  BP: 138/86  Pulse: 73   General Examination: The patient is a very pleasant 57 y.o. female in no acute distress. [name] appears well-developed and well-nourished and well groomed. Good spirits.   HEENT: Normocephalic, atraumatic, pupils are equal, round and reactive to light and accommodation. Extraocular tracking is good without limitation to gaze excursion or nystagmus noted. Normal smooth pursuit is noted. Hearing is grossly intact. Face is symmetric with normal facial animation and normal facial sensation. Speech is clear with no dysarthria noted. There is no hypophonia. There is no lip, neck/head, jaw or voice tremor. Neck with FROM. Oropharynx exam reveals: mild mouth dryness, adequate dental hygiene and marked airway crowding. Tongue protrudes centrally and palate elevates symmetrically.   Chest: Clear to auscultation without wheezing, rhonchi or crackles noted.  Heart: S1+S2+0, regular and normal without murmurs, rubs or gallops noted.    Abdomen: Soft, non-tender and non-distended with normal bowel sounds appreciated on auscultation.  Extremities: There is no obvious change.   Skin: Warm and dry without trophic changes noted.  Musculoskeletal: exam reveals no obvious joint deformities, tenderness  or joint swelling or erythema.   Neurologically:  Mental status: The patient is awake, alert and oriented in all 4 spheres. Her immediate and remote memory, attention, language skills and fund of knowledge are appropriate. There is no evidence of aphasia, agnosia, apraxia or anomia. Speech is clear with normal prosody and enunciation. Thought process is linear. Mood is normal and affect is normal.  Cranial nerves II - XII are as described above under HEENT exam.  Motor exam: Normal bulk, strength and tone is noted on the L, stable mild R sided weakness. Fine motor skills and coordination: grossly intact.  Cerebellar testing: No dysmetria or intention tremor. There is no truncal or gait ataxia.  Sensory exam: intact to light touch in the upper and lower extremities.  Gait, station and balance: [name] stands with difficulty. [name] stands somewhat wide-based. [name] walks with a 4 pronged cane on the left.   Assessment and Plan:  In summary, ALLAN BACIGALUPI is a very pleasant 57 year old female with an underlying medical history of intracranial hemorrhage, seizure disorder, iron deficiency anemia, hypertension, hyperlipidemia, depression, and morbid obesity with a BMI over 33, who presents for follow-up consultation of her severe obstructive sleep apnea, after sleep study testing and starting CPAP therapy at home. [name] is compliant with her CPAP and is commended for this. While [name] has noticed improvement in her sleep quality and nocturia as well as sleep consolidation, [name] has struggled with air leak from the mask and tolerance of the full facemask. [name] is scheduled for a mask refit through her DME company. [name] is advised regarding the risks  and ramifications of untreated moderate to severe obstructive sleep apnea. We talked about her sleep study results from her baseline sleep study from 05/27/2017 as well as her CPAP titration study from 07/08/2017. [name] is advised to continue to be fully compliant with her CPAP. [name] is motivated to continue. [name] is strongly advised to try to aggressively pursue weight loss. [name] is advised to follow-up routinely in 6 months with Cecille Rubin, nurse practitioner. I answered all their questions today and the patient and her husband were in agreement.  I spent 25 minutes in total face-to-face time with the patient, more than 50% of which was spent in counseling and coordination of care, reviewing test results, reviewing medication and discussing or reviewing the diagnosis of OSA, its prognosis and treatment options. Pertinent laboratory and imaging test results that were available during this visit with the patient were reviewed by me and considered in my medical decision making (see chart for details).

## 2017-10-08 NOTE — Patient Instructions (Addendum)
Please continue using your CPAP regularly. While your insurance requires that you use CPAP at least 4 hours each night on 70% of the nights, I recommend, that you not skip any nights and use it throughout the night if you can. Getting used to CPAP and staying with the treatment long term does take time and patience and discipline. Untreated obstructive sleep apnea when it is moderate to severe can have an adverse impact on cardiovascular health and raise her risk for heart disease, arrhythmias, hypertension, congestive heart failure, stroke and diabetes. Untreated obstructive sleep apnea causes sleep disruption, nonrestorative sleep, and sleep deprivation. This can have an impact on your day to day functioning and cause daytime sleepiness and impairment of cognitive function, memory loss, mood disturbance, and problems focussing. Using CPAP regularly can improve these symptoms.  Please work on weight loss!   Keep up the good work! We can see you in 6 months, you can see [name], nurse practitioner as you are stable.   Please make the appointment for mask refit.

## 2017-10-15 ENCOUNTER — Ambulatory Visit: Payer: Medicaid Other | Attending: Family Medicine | Admitting: Physician Assistant

## 2017-10-15 VITALS — BP 131/80 | HR 75 | Temp 98.5°F | Resp 18 | Ht 59.0 in | Wt 292.0 lb

## 2017-10-15 DIAGNOSIS — E785 Hyperlipidemia, unspecified: Secondary | ICD-10-CM | POA: Diagnosis not present

## 2017-10-15 DIAGNOSIS — Z888 Allergy status to other drugs, medicaments and biological substances status: Secondary | ICD-10-CM | POA: Diagnosis not present

## 2017-10-15 DIAGNOSIS — Z79899 Other long term (current) drug therapy: Secondary | ICD-10-CM | POA: Insufficient documentation

## 2017-10-15 DIAGNOSIS — I618 Other nontraumatic intracerebral hemorrhage: Secondary | ICD-10-CM | POA: Diagnosis not present

## 2017-10-15 DIAGNOSIS — E1165 Type 2 diabetes mellitus with hyperglycemia: Secondary | ICD-10-CM

## 2017-10-15 DIAGNOSIS — D509 Iron deficiency anemia, unspecified: Secondary | ICD-10-CM | POA: Insufficient documentation

## 2017-10-15 DIAGNOSIS — I10 Essential (primary) hypertension: Secondary | ICD-10-CM

## 2017-10-15 DIAGNOSIS — Z8673 Personal history of transient ischemic attack (TIA), and cerebral infarction without residual deficits: Secondary | ICD-10-CM | POA: Insufficient documentation

## 2017-10-15 DIAGNOSIS — R569 Unspecified convulsions: Secondary | ICD-10-CM | POA: Diagnosis not present

## 2017-10-15 DIAGNOSIS — Z7984 Long term (current) use of oral hypoglycemic drugs: Secondary | ICD-10-CM | POA: Diagnosis not present

## 2017-10-15 DIAGNOSIS — R5383 Other fatigue: Secondary | ICD-10-CM

## 2017-10-15 DIAGNOSIS — I611 Nontraumatic intracerebral hemorrhage in hemisphere, cortical: Secondary | ICD-10-CM | POA: Diagnosis not present

## 2017-10-15 LAB — GLUCOSE, POCT (MANUAL RESULT ENTRY): POC Glucose: 177 mg/dl — AB (ref 70–99)

## 2017-10-15 MED ORDER — ATORVASTATIN CALCIUM 20 MG PO TABS: 20.0000 mg | ORAL_TABLET | Freq: Every day | ORAL | 3 refills | Status: DC

## 2017-10-15 MED ORDER — CHLORTHALIDONE 25 MG PO TABS: 25.0000 mg | ORAL_TABLET | Freq: Every day | ORAL | 3 refills | Status: DC

## 2017-10-15 MED ORDER — METFORMIN HCL ER 500 MG PO TB24: 500.0000 mg | ORAL_TABLET | Freq: Two times a day (BID) | ORAL | 3 refills | Status: DC

## 2017-10-15 MED ORDER — METOPROLOL TARTRATE 100 MG PO TABS: 100.0000 mg | ORAL_TABLET | Freq: Two times a day (BID) | ORAL | 3 refills | Status: DC

## 2017-10-15 NOTE — Progress Notes (Signed)
Patient ID: [name], female   DOB: 20-Apr-1960, 57 y.o.   MRN: [id number]   Aprile Dickenson, is a 57 y.o. female  PIR:[id number]  YTK:[id number]  DOB - 1960/12/03  Subjective:  Chief Complaint and HPI: [name] is a 57 y.o. female here today for f/up and med RF.  Concerned about fatigue and weight gain.    ROS:   Constitutional:  No f/c, No night sweats, No unexplained weight loss. EENT:  No vision changes, No blurry vision, No hearing changes. No mouth, throat, or ear problems.  Respiratory: No cough, No SOB Cardiac: No CP, no palpitations GI:  No abd pain, No N/V/D. GU: No Urinary s/sx Musculoskeletal: No joint pain Neuro: No headache, no dizziness, no motor weakness.  Skin: No rash Endocrine:  No polydipsia. No polyuria.  Psych: Denies SI/HI  No problems updated.  ALLERGIES: Allergies  Allergen Reactions  . Bee Venom Swelling    Facial swelling  . Eggs Or Egg-Derived Products Nausea And Vomiting and Swelling    Mouth swelled  . Gabapentin     Jittery feeling, dyspnea, crawling sensation  . Gadolinium Derivatives Itching    Immediately after gad injection pt. complained of tongue numbness and lower lip/ then had rt side of body tingling/ pt. Was assessed by Dr. Jeralyn Ruths before leaving/ no medications were administered/    PAST MEDICAL HISTORY: Past Medical History:  Diagnosis Date  . Headache   . Hypertension   . Iron deficiency anemia   . Seizures ([place])   . Stroke [facility])     MEDICATIONS AT HOME: Prior to Admission medications   Medication Sig Start Date End Date Taking? Authorizing Provider  acetaminophen (TYLENOL) 500 MG tablet Take 500 mg by mouth every 6 (six) hours as needed for headache (pain).   Yes [provider]  atorvastatin (LIPITOR) 20 MG tablet Take 1 tablet (20 mg total) by mouth daily. 10/15/17  Yes Argentina Donovan, PA-C  cetirizine (ZYRTEC) 10 MG tablet Take 10 mg by mouth daily as needed (allergic reaction).   Yes [provider]  chlorthalidone (HYGROTON) 25 MG tablet Take 1 tablet (25 mg total) by mouth daily. 10/15/17  Yes Argentina Donovan, PA-C  diclofenac sodium (VOLTAREN) 1 % GEL Apply 2 g topically 4 (four) times daily. 05/23/17  Yes Charlott Rakes, MD  diphenhydrAMINE (BENADRYL) 25 MG tablet Take 25 mg by mouth daily as needed for allergies (bee stings).   Yes [provider]  DULoxetine (CYMBALTA) 60 MG capsule TAKE 1 CAPSULE DAILY BY MOUTH. 08/15/17  Yes Patel, Domenick Bookbinder, MD  fluticasone (FLONASE) 50 MCG/ACT nasal spray Place 1 spray into both nostrils daily as needed for allergies or rhinitis. 05/23/17  Yes Charlott Rakes, MD  hydroxypropyl methylcellulose / hypromellose (ISOPTO TEARS / GONIOVISC) 2.5 % ophthalmic solution Place 2 drops into both eyes as needed (irritation). 05/23/17  Yes Newlin, Charlane Ferretti, MD  levETIRAcetam (KEPPRA) 500 MG tablet TAKE 1 & 1/2 TABLETS BY MOUTH TWICE DAILY. Patient taking differently: Take 750 mg by mouth 2 (two) times daily.  04/02/17  Yes Dennie Bible, NP  meclizine (ANTIVERT) 25 MG tablet Take 1 tablet (25 mg total) by mouth 2 (two) times daily as needed for dizziness. 07/16/17  Yes Charlott Rakes, MD  metFORMIN (GLUCOPHAGE XR) 500 MG 24 hr tablet Take 1 tablet (500 mg total) by mouth 2 (two) times daily. 10/15/17  Yes McClung, Dionne Bucy, PA-C  methocarbamol (ROBAXIN) 750 MG tablet TAKE 1 TABLET BY MOUTH  2 TIMES DAILY AS NEEDED FOR MUSCLE SPASMS. 05/15/17  Yes Jamse Arn, MD  metoprolol tartrate (LOPRESSOR) 100 MG tablet Take 1 tablet (100 mg total) by mouth 2 (two) times daily. 10/15/17  Yes Argentina Donovan, PA-C     Objective:  EXAM:   Vitals:   10/15/17 1041  BP: 131/80  Pulse: 75  Resp: 18  Temp: 98.5 F (36.9 C)  TempSrc: Oral  SpO2: 97%  Weight: 292 lb (132.5 kg)  Height: 4\' 11"  (1.499 m)    General appearance : A&OX3. NAD. Non-toxic-appearing HEENT: Atraumatic and Normocephalic.  PERRLA. EOM intact.  Neck: supple, no JVD. No  cervical lymphadenopathy. No thyromegaly Chest/Lungs:  Breathing-non-labored, Good air entry bilaterally, breath sounds normal without rales, rhonchi, or wheezing  CVS: S1 S2 regular, no murmurs, gallops, rubs  Extremities: Bilateral Lower Ext shows no edema, both legs are warm to touch with = pulse throughout Neurology:  CN II-XII grossly intact, Non focal.   Psych:  TP linear. J/I WNL. Normal speech. Appropriate eye contact and affect.  Skin:  No Rash  Data Review Lab Results  Component Value Date   HGBA1C 5.8 02/20/2016   HGBA1C 5.7 (H) 10/07/2015     Assessment & Plan   1. Type 2 diabetes mellitus with hyperglycemia, without long-term current use of insulin (HCC) Uncontrolled-increase dose metformin - Glucose (CBG) - Hemoglobin A1c - metFORMIN (GLUCOPHAGE XR) 500 MG 24 hr tablet; Take 1 tablet (500 mg total) by mouth 2 (two) times daily.  Dispense: 180 tablet; Refill: 3  2. Benign essential HTN Controlled  - Comprehensive metabolic panel - chlorthalidone (HYGROTON) 25 MG tablet; Take 1 tablet (25 mg total) by mouth daily.  Dispense: 30 tablet; Refill: 3 - metoprolol tartrate (LOPRESSOR) 100 MG tablet; Take 1 tablet (100 mg total) by mouth 2 (two) times daily.  Dispense: 60 tablet; Refill: 3  3. Nontraumatic cortical hemorrhage of cerebral hemisphere, unspecified laterality (HCC) - atorvastatin (LIPITOR) 20 MG tablet; Take 1 tablet (20 mg total) by mouth daily.  Dispense: 30 tablet; Refill: 3  4. Hyperlipidemia, unspecified hyperlipidemia type - atorvastatin (LIPITOR) 20 MG tablet; Take 1 tablet (20 mg total) by mouth daily.  Dispense: 30 tablet; Refill: 3 - Lipid panel  5. Fatigue, unspecified type - TSH - Vitamin D, 25-hydroxy   Patient have been counseled extensively about nutrition and exercise  Return in about 3 months (around 01/15/2018) for Dr Johnette Abraham, htn.  The patient was given clear instructions to go to ER or return to medical center if symptoms don't  improve, worsen or new problems develop. The patient verbalized understanding. The patient was told to call to get lab results if they haven't heard anything in the next week.     Freeman Caldron, PA-C [facility] [place], [place]   10/15/2017, 10:50 AM

## 2017-10-15 NOTE — Progress Notes (Signed)
[phone] 

## 2017-10-16 ENCOUNTER — Other Ambulatory Visit: Payer: Self-pay | Admitting: Physician Assistant

## 2017-10-16 DIAGNOSIS — E559 Vitamin D deficiency, unspecified: Secondary | ICD-10-CM

## 2017-10-16 LAB — COMPREHENSIVE METABOLIC PANEL
A/G RATIO: 0.9 — AB (ref 1.2–2.2)
ALT: 12 IU/L (ref 0–32)
AST: 14 IU/L (ref 0–40)
Albumin: 3.6 g/dL (ref 3.5–5.5)
Alkaline Phosphatase: 73 IU/L (ref 39–117)
BUN/Creatinine Ratio: 17 (ref 9–23)
BUN: 17 mg/dL (ref 6–24)
Bilirubin Total: 0.3 mg/dL (ref 0.0–1.2)
CALCIUM: 9.3 mg/dL (ref 8.7–10.2)
CO2: 27 mmol/L (ref 20–29)
CREATININE: 1.03 mg/dL — AB (ref 0.57–1.00)
Chloride: 98 mmol/L (ref 96–106)
GFR, EST AFRICAN AMERICAN: 70 mL/min/{1.73_m2} (ref >59)
GFR, EST NON AFRICAN AMERICAN: 61 mL/min/{1.73_m2} (ref >59)
GLUCOSE: 177 mg/dL — AB (ref 65–99)
Globulin, Total: 4.1 g/dL (ref 1.5–4.5)
POTASSIUM: 3.7 mmol/L (ref 3.5–5.2)
Sodium: 142 mmol/L (ref 134–144)
TOTAL PROTEIN: 7.7 g/dL (ref 6.0–8.5)

## 2017-10-16 LAB — TSH: TSH: 1.77 u[IU]/mL (ref 0.450–4.500)

## 2017-10-16 LAB — LIPID PANEL
Chol/HDL Ratio: 2.2 ratio (ref 0.0–4.4)
Cholesterol, Total: 74 mg/dL — ABNORMAL LOW (ref 100–199)
HDL: 33 mg/dL — AB (ref >39)
LDL Calculated: 24 mg/dL (ref 0–99)
Triglycerides: 86 mg/dL (ref 0–149)
VLDL CHOLESTEROL CAL: 17 mg/dL (ref 5–40)

## 2017-10-16 LAB — HEMOGLOBIN A1C
Est. average glucose Bld gHb Est-mCnc: 140 mg/dL
Hgb A1c MFr Bld: 6.5 % — ABNORMAL HIGH (ref 4.8–5.6)

## 2017-10-16 LAB — VITAMIN D 25 HYDROXY (VIT D DEFICIENCY, FRACTURES): VIT D 25 HYDROXY: 13.5 ng/mL — AB (ref 30.0–100.0)

## 2017-10-16 MED ORDER — VITAMIN D (ERGOCALCIFEROL) 1.25 MG (50000 UNIT) PO CAPS: 50000.0000 [IU] | ORAL_CAPSULE | ORAL | 0 refills | Status: DC

## 2017-10-17 ENCOUNTER — Telehealth: Payer: Self-pay | Admitting: *Deleted

## 2017-10-17 NOTE — Telephone Encounter (Signed)
Patient verified DOB Patient is aware of vitamin d being low and needing to take a supplement weekly and complete a recheck in 3-4 months. Patient also aware of A1C indicating poor control and needing to take metformin twice a day and eliminate carbohydrates and sugar from diet. No further questions.

## 2017-10-17 NOTE — Telephone Encounter (Signed)
-----   Message from Argentina Donovan, [place] sent at 10/16/2017 11:00 AM EDT ----- Please call patient and let them that their vitamin D is low.  This can contribute to muscle aches, anxiety, fatigue, and depression.  I have sent a prescription to the pharmacy for them to take once a week.  We will recheck this level in 3-4 months.  Her Hemoglobin A1C does indicate worsening blood sugars/diabetes.  She needs to take the metformin twice daily and eliminate sugars/white carbohydrates from her diet.  Other labs including thyroid are normal.  Follow-up as planned.  Thanks,  Freeman Caldron, PA-C

## 2017-10-21 ENCOUNTER — Other Ambulatory Visit: Payer: Self-pay | Admitting: Physical Medicine & Rehabilitation

## 2017-10-21 MED FILL — METHOCARBAMOL 750 MG TABS: 750 | 30 days supply | Qty: 60 | Fill #0

## 2017-10-21 MED FILL — DULoxetine HCL 60 MG CPEP: 60 | 30 days supply | Qty: 30 | Fill #0

## 2017-10-24 MED FILL — CHLORTHALIDONE 25 MG TAB: 25 | 90 days supply | Qty: 90 | Fill #0

## 2017-10-24 MED FILL — METFORMIN HCL ER 500 MG TAB: 500 | 90 days supply | Qty: 180 | Fill #0

## 2017-10-24 MED FILL — VIT D2 1.25 MG (50,000 UNIT: 1.25 MG | 28 days supply | Qty: 4 | Fill #0

## 2017-10-30 ENCOUNTER — Telehealth: Payer: Self-pay | Admitting: *Deleted

## 2017-10-30 NOTE — Telephone Encounter (Signed)
She actually had it done 10/06/17 and is looking for results.She needs to see Dr Posey Pronto and we have given her an appointment.

## 2017-10-30 NOTE — Telephone Encounter (Addendum)
[name] called asking about her MRI. Can you check on this and let her know status, please? (disreguard this, she has had it done)

## 2017-10-31 ENCOUNTER — Other Ambulatory Visit: Payer: Self-pay

## 2017-10-31 ENCOUNTER — Encounter: Payer: Self-pay | Admitting: Physical Medicine & Rehabilitation

## 2017-10-31 ENCOUNTER — Encounter: Payer: Medicaid Other | Attending: Physical Medicine & Rehabilitation | Admitting: Physical Medicine & Rehabilitation

## 2017-10-31 VITALS — BP 159/68 | HR 69 | Ht 59.0 in | Wt 293.6 lb

## 2017-10-31 DIAGNOSIS — I629 Nontraumatic intracranial hemorrhage, unspecified: Secondary | ICD-10-CM | POA: Insufficient documentation

## 2017-10-31 DIAGNOSIS — F329 Major depressive disorder, single episode, unspecified: Secondary | ICD-10-CM | POA: Diagnosis not present

## 2017-10-31 DIAGNOSIS — I69398 Other sequelae of cerebral infarction: Secondary | ICD-10-CM | POA: Insufficient documentation

## 2017-10-31 DIAGNOSIS — R7303 Prediabetes: Secondary | ICD-10-CM | POA: Diagnosis not present

## 2017-10-31 DIAGNOSIS — I69249 Monoplegia of lower limb following other nontraumatic intracranial hemorrhage affecting unspecified side: Secondary | ICD-10-CM | POA: Insufficient documentation

## 2017-10-31 DIAGNOSIS — R569 Unspecified convulsions: Secondary | ICD-10-CM | POA: Diagnosis not present

## 2017-10-31 DIAGNOSIS — I10 Essential (primary) hypertension: Secondary | ICD-10-CM | POA: Diagnosis not present

## 2017-10-31 DIAGNOSIS — Z5189 Encounter for other specified aftercare: Secondary | ICD-10-CM | POA: Insufficient documentation

## 2017-10-31 DIAGNOSIS — M62838 Other muscle spasm: Secondary | ICD-10-CM | POA: Insufficient documentation

## 2017-10-31 DIAGNOSIS — R350 Frequency of micturition: Secondary | ICD-10-CM

## 2017-10-31 DIAGNOSIS — M7541 Impingement syndrome of right shoulder: Secondary | ICD-10-CM | POA: Diagnosis not present

## 2017-10-31 DIAGNOSIS — E236 Other disorders of pituitary gland: Secondary | ICD-10-CM

## 2017-10-31 DIAGNOSIS — M25511 Pain in right shoulder: Secondary | ICD-10-CM

## 2017-10-31 DIAGNOSIS — R269 Unspecified abnormalities of gait and mobility: Secondary | ICD-10-CM | POA: Diagnosis not present

## 2017-10-31 DIAGNOSIS — G8929 Other chronic pain: Secondary | ICD-10-CM

## 2017-10-31 NOTE — Progress Notes (Addendum)
Subjective:    Patient ID: [name], female    DOB: [birth year], 57 y.o.   MRN: [id number]  HPI  57 year old RH-female with history of morbid obesity, HTN, iron deficiency anemia who presents for follow up after right temporal intracranial hemorrhage.  Last clinic visit 08/29/17.  Since that time, pt states [name] has not followed up at [facility].  [name] states [name] is trying to walk more. [name] did obtain MRI on her last visit. [name] states [name] lost 3 pounds. Denies falls. BP is elevated today. Shoulder continues to hurt, but improved.  Pain Inventory Average Pain  7 Pain Right Now 6 My pain is stabbing  In the last 24 hours, has pain interfered with the following? General activity 4 Relation with others 4 Enjoyment of life 5 What TIME of day is your pain at its worst? morning and evening Sleep (in general) Fair  Pain is worse with: walking, standing and some activites Pain improves with: heat/ice and medication Relief from Meds: 5  Mobility walk with assistance use a cane ability to climb steps?  yes do you drive?  yes Do you have any goals in this area?  yes  Function disabled: date disabled 09/2015  Neuro/Psych trouble walking dizziness  Prior Studies Any changes since last visit?  no  Physicians involved in your care Any changes since last visit?  no   Family History  Problem Relation Age of Onset  . Diabetes Father   . Hypertension Father   . Hypertension Sister   . Stroke Brother    Social History   Socioeconomic History  . Marital status: Married    Spouse name: Not on file  . Number of children: Not on file  . Years of education: Not on file  . Highest education level: Not on file  Occupational History  . Not on file  Social Needs  . Financial resource strain: Not on file  . Food insecurity:    Worry: Not on file    Inability: Not on file  . Transportation needs:    Medical: Not on file    Non-medical: Not on file  Tobacco Use  . Smoking status:  Never Smoker  . Smokeless tobacco: Never Used  Substance and Sexual Activity  . Alcohol use: No  . Drug use: No  . Sexual activity: Not Currently  Lifestyle  . Physical activity:    Days per week: Not on file    Minutes per session: Not on file  . Stress: Not on file  Relationships  . Social connections:    Talks on phone: Not on file    Gets together: Not on file    Attends religious service: Not on file    Active member of club or organization: Not on file    Attends meetings of clubs or organizations: Not on file    Relationship status: Not on file  Other Topics Concern  . Not on file  Social History Narrative  . Not on file   Past Surgical History:  Procedure Laterality Date  . CARDIAC CATHETERIZATION  04/2010   Dr. Terrence Dupont   Past Medical History:  Diagnosis Date  . Headache   . Hypertension   . Iron deficiency anemia   . Seizures ([place])   . Stroke ([place])    BP (!) 159/68   Pulse 69   Ht 4\' 11"  (1.499 m)   Wt 293 lb 9.6 oz (133.2 kg)   LMP 02/17/2011  SpO2 91%   BMI 59.30 kg/m   Opioid Risk Score:   Fall Risk Score:  `1  Depression screen PHQ 2/9  Depression screen [facility] 2/9 10/31/2017 10/15/2017 08/29/2017 07/16/2017 05/23/2017 02/27/2017 10/10/2016  Decreased Interest 1 2 0 1 2 1 1   Down, Depressed, Hopeless 1 1 0 1 2 1 1   PHQ - 2 Score 2 3 0 2 4 2 2   Altered sleeping - 2 - 2 2 - -  Tired, decreased energy - 1 - 1 2 - -  Change in appetite - 1 - 1 1 - -  Feeling bad or failure about yourself  - 1 - 1 2 - -  Trouble concentrating - 0 - 0 (No Data) - -  Moving slowly or fidgety/restless - 1 - 0 1 - -  Suicidal thoughts - 0 - 0 (No Data) - -  PHQ-9 Score - 9 - 7 12 - -  Difficult doing work/chores - - - - - - -  Some recent data might be hidden    Review of Systems  Constitutional: Positive for unexpected weight change.       Bladder control problems   HENT: Negative.   Eyes: Negative.   Respiratory: Positive for shortness of breath and wheezing.     Cardiovascular: Positive for leg swelling.  Gastrointestinal: Negative.   Endocrine: Negative.   Genitourinary: Negative.   Musculoskeletal: Positive for arthralgias and gait problem.       Spasms   Skin: Negative.   Neurological: Positive for dizziness and weakness.       Tingling    Hematological: Negative.   Psychiatric/Behavioral: Positive for confusion and dysphoric mood.  All other systems reviewed and are negative.     Objective:   Physical Exam Constitutional: [name] appears well-developed and well-nourished. NAD. Morbidly obese.  HENT: Normocephalic and atraumatic.  Eyes: EOMI. No discharge.  Cardiovascular: RRR. No JVD. Respiratory: Effort normal and breath sounds normal.  GI: Soft. Bowel sounds are normal.  Musculoskeletal:  Gait: Slow cadence with quad cane No edema. +Mild TTP right shoulder. Neurological: [name] is alert and oriented.  Motor: RUE: 4/5 throughout. RLE: 4/5 HF, 4/5 distally Sensation intact to light touch Skin: Skin is warm and dry. Intact.  Psychiatric: [name] has a normal mood and affect.      Assessment & Plan:  57 year old RH-female with history of morbid obesity, HTN, iron deficiency anemia who presents for follow up after right temporal intracranial hemorrhage.  1.S/p right temporal intracranial hemorrhage with dysesthesias  Side effects with gabapentin             Aquatic therapy denied by insurance, encouraged follow up at [facility] x4, pt states [name] does not like the water  MRI reviewed - stable infarct with empty sella persistent  Cont meds  Cont Cymbalta   Patient with increased weakness and sensory deficits on RUE/RLL - will order MRI. Instructed to present to ED if symptoms become worse              2. Depression     Reaction to Prozaac  Pt states [name] is managing with her chaplain and scriptures  Cont duloxetine.  3. Seizures             Cont meds  4. Morbid obesity  Cont follow up with dietitian             Cont to encourage  weight loss again, no improvement, has not tried anything, reminded again  5. Muscle spasms             Encouraged ROM and stretching  Cont Robaxin 750 BID PRN  6. Abnormality of gait - post stroke             Cont cane  Therapies completed, cont HEP  7. Right shoulder pain             Xrays reviewed previously with some degenerative change right shoulder             Cont Voltaren gel   Will consider steroid injection in future, [name] still does not want at present  Cont Voltaren gel  Improved  8. Right leg pain  See #5, #7  9. Falls  In sleep  Encouraged pt to push bed against wall or place furniture against bed to limit chances of rolling onto floor  Improving  10. 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 Urinary frequency  Mainly nocturnal  Encouraged decrease in nighttime intake and increase daytime fluid intake

## 2017-11-05 MED FILL — METOPROLOL TARTRATE 100 MG: 100 | 90 days supply | Qty: 180 | Fill #0

## 2017-11-05 MED FILL — ATORVASTATIN 20 MG TABLET: 20 | 30 days supply | Qty: 30 | Fill #0

## 2017-11-12 MED FILL — levETIRAcetam 500 MG TABS: 500 | 30 days supply | Qty: 90 | Fill #4

## 2017-11-25 ENCOUNTER — Other Ambulatory Visit: Payer: Self-pay | Admitting: Physical Medicine & Rehabilitation

## 2017-11-25 MED FILL — VIT D2 1.25 MG (50,000 UNIT: 1.25 MG | 28 days supply | Qty: 4 | Fill #1

## 2017-11-25 MED FILL — DULoxetine HCL 60 MG CPEP: 60 | 30 days supply | Qty: 30 | Fill #1

## 2017-12-01 ENCOUNTER — Ambulatory Visit: Payer: Medicaid Other | Admitting: Nurse Practitioner

## 2017-12-08 MED FILL — ATORVASTATIN 20 MG TABLET: 20 | 30 days supply | Qty: 30 | Fill #1

## 2017-12-12 ENCOUNTER — Other Ambulatory Visit: Payer: Self-pay | Admitting: Family Medicine

## 2017-12-12 DIAGNOSIS — R42 Dizziness and giddiness: Secondary | ICD-10-CM

## 2017-12-16 MED FILL — MECLIZINE 25 MG TABLET: 25 | 30 days supply | Qty: 60 | Fill #0

## 2017-12-22 MED FILL — VIT D2 1.25 MG (50,000 UNIT: 1.25 MG | 28 days supply | Qty: 4 | Fill #2

## 2017-12-22 MED FILL — levETIRAcetam 500 MG TABS: 500 | 30 days supply | Qty: 90 | Fill #5

## 2017-12-22 MED FILL — DULoxetine HCL 60 MG CPEP: 60 | 30 days supply | Qty: 30 | Fill #0

## 2017-12-30 MED FILL — METHOCARBAMOL 750 MG TABS: 750 | 30 days supply | Qty: 60 | Fill #1

## 2018-01-14 MED FILL — ATORVASTATIN 20 MG TABLET: 20 | 30 days supply | Qty: 30 | Fill #2

## 2018-01-21 ENCOUNTER — Other Ambulatory Visit: Payer: Self-pay

## 2018-01-21 ENCOUNTER — Encounter (HOSPITAL_COMMUNITY): Payer: Self-pay | Admitting: Emergency Medicine

## 2018-01-21 ENCOUNTER — Ambulatory Visit (HOSPITAL_COMMUNITY)
Admission: EM | Admit: 2018-01-21 | Discharge: 2018-01-21 | Disposition: A | Discharge status: Home / Self Care | Payer: Medicaid Other | Attending: Family Medicine | Admitting: Family Medicine

## 2018-01-21 ENCOUNTER — Ambulatory Visit (INDEPENDENT_AMBULATORY_CARE_PROVIDER_SITE_OTHER): Payer: Medicaid Other

## 2018-01-21 DIAGNOSIS — R05 Cough: Secondary | ICD-10-CM | POA: Diagnosis not present

## 2018-01-21 DIAGNOSIS — R059 Cough, unspecified: Secondary | ICD-10-CM

## 2018-01-21 DIAGNOSIS — J0100 Acute maxillary sinusitis, unspecified: Secondary | ICD-10-CM | POA: Diagnosis not present

## 2018-01-21 MED ORDER — AZITHROMYCIN 250 MG PO TABS: 250.0000 mg | ORAL_TABLET | Freq: Every day | ORAL | 0 refills | Status: DC

## 2018-01-21 MED ORDER — BENZONATATE 100 MG PO CAPS: 100.0000 mg | ORAL_CAPSULE | Freq: Three times a day (TID) | ORAL | 0 refills | Status: DC

## 2018-01-21 MED FILL — AZITHROMYCIN 250 MG TABLET: 250 | 5 days supply | Qty: 6 | Fill #0

## 2018-01-21 MED FILL — CHLORTHALIDONE 25 MG TAB: 25 | 30 days supply | Qty: 30 | Fill #1

## 2018-01-21 MED FILL — levETIRAcetam 500 MG TABS: 500 | 30 days supply | Qty: 90 | Fill #6

## 2018-01-21 NOTE — ED Triage Notes (Signed)
Pt reports SOB at night when lying in bed and pt is also SOB sitting in the triage room.  She also reports a clear frothy sputum at night and SOB on exertion.  This has been going on for two weeks.

## 2018-01-22 NOTE — ED Provider Notes (Signed)
[place]   [id number] 01/21/18 Arrival Time: 2536  ASSESSMENT & PLAN:  1. Acute non-recurrent maxillary sinusitis   2. Cough     Meds ordered this encounter  Medications  . azithromycin (ZITHROMAX) 250 MG tablet    Sig: Take 1 tablet (250 mg total) by mouth daily. Take first 2 tablets together, then 1 every day until finished.    Dispense:  6 tablet    Refill:  0  . benzonatate (TESSALON) 100 MG capsule    Sig: Take 1 capsule (100 mg total) by mouth every 8 (eight) hours.    Dispense:  21 capsule    Refill:  0   Discussed typical duration of symptoms. OTC symptom care as needed. Ensure adequate fluid intake and rest.  Follow-up Information    Charlott Rakes, MD.   Specialty:  Family Medicine Why:  As needed. Contact information: [place] [address] [phone]        [facility].   Specialty:  Urgent Care Why:  If symptoms worsen. Contact information: [place] [phone]          Reviewed expectations re: course of current medical issues. Questions answered. Outlined signs and symptoms indicating need for more acute intervention. Patient verbalized understanding. After Visit Summary given.   SUBJECTIVE: History from: patient.  [name] is a 57 y.o. female who presents with complaint of nasal congestion, post-nasal drainage, and sinus pain. Also persistent cough x several weeks; occasionally "coughing up frothy sputum". Onset gradual, a few weeks ago (sinus symptoms). No SOB/wheezing. Fever: no. Overall normal PO intake without n/v. OTC treatment: Cold medication without much help. History of frequent sinus infections: no. No specific aggravating or alleviating factors reported. Social History   Tobacco Use  Smoking Status Never Smoker  Smokeless Tobacco Never Used    ROS: As per HPI.   OBJECTIVE:  Vitals:   01/21/18 1509    BP: 131/69  Pulse: 87  Temp: 98.3 F (36.8 C)  TempSrc: Oral  SpO2: 98%     General appearance: alert; appears fatigued HEENT: nasal congestion; clear runny nose; throat irritation secondary to post-nasal drainage; bilateral maxillary tenderness to palpation; turbinates boggy Neck: supple without LAD; trachea midline Lungs: unlabored respirations, symmetrical air entry; cough: mild; no respiratory distress Skin: warm and dry Psychological: alert and cooperative; normal mood and affect  Allergies  Allergen Reactions  . Bee Venom Swelling    Facial swelling  . Eggs Or Egg-Derived Products Nausea And Vomiting and Swelling    Mouth swelled  . Gabapentin     Jittery feeling, dyspnea, crawling sensation  . Gadolinium Derivatives Itching    Immediately after gad injection pt. complained of tongue numbness and lower lip/ then had rt side of body tingling/ pt. Was assessed by Dr. Jeralyn Ruths before leaving/ no medications were administered/    Past Medical History:  Diagnosis Date  . Headache   . Hypertension   . Iron deficiency anemia   . Seizures ([place])   . Stroke [facility])    Family History  Problem Relation Age of Onset  . Diabetes Father   . Hypertension Father   . Hypertension Sister   . Stroke Brother    Social History   Socioeconomic History  . Marital status: Married    Spouse name: Not on file  . Number of children: Not on file  . Years of education: Not on file  .  Highest education level: Not on file  Occupational History  . Not on file  Social Needs  . Financial resource strain: Not on file  . Food insecurity:    Worry: Not on file    Inability: Not on file  . Transportation needs:    Medical: Not on file    Non-medical: Not on file  Tobacco Use  . Smoking status: Never Smoker  . Smokeless tobacco: Never Used  Substance and Sexual Activity  . Alcohol use: No  . Drug use: No  . Sexual activity: Not Currently  Lifestyle  . Physical activity:    Days per  week: Not on file    Minutes per session: Not on file  . Stress: Not on file  Relationships  . Social connections:    Talks on phone: Not on file    Gets together: Not on file    Attends religious service: Not on file    Active member of club or organization: Not on file    Attends meetings of clubs or organizations: Not on file    Relationship status: Not on file  . Intimate partner violence:    Fear of current or ex partner: Not on file    Emotionally abused: Not on file    Physically abused: Not on file    Forced sexual activity: Not on file  Other Topics Concern  . Not on file  Social History Narrative  . Not on file            Vanessa Kick, MD 01/22/18 [phone]

## 2018-01-23 ENCOUNTER — Encounter: Payer: Self-pay | Admitting: Adult Health

## 2018-01-26 MED FILL — VIT D2 1.25 MG (50,000 UNIT: 1.25 MG | 28 days supply | Qty: 4 | Fill #3

## 2018-01-27 MED FILL — DULoxetine HCL 60 MG CPEP: 60 | 30 days supply | Qty: 30 | Fill #1

## 2018-02-05 ENCOUNTER — Telehealth: Payer: Self-pay | Admitting: Physical Medicine & Rehabilitation

## 2018-02-05 ENCOUNTER — Encounter: Payer: Self-pay | Admitting: Physical Medicine & Rehabilitation

## 2018-02-05 ENCOUNTER — Encounter: Payer: Medicaid Other | Attending: Physical Medicine & Rehabilitation | Admitting: Physical Medicine & Rehabilitation

## 2018-02-05 VITALS — BP 174/94 | HR 70 | Resp 14 | Ht 59.0 in | Wt 290.0 lb

## 2018-02-05 DIAGNOSIS — M62838 Other muscle spasm: Secondary | ICD-10-CM | POA: Diagnosis not present

## 2018-02-05 DIAGNOSIS — F329 Major depressive disorder, single episode, unspecified: Secondary | ICD-10-CM | POA: Diagnosis not present

## 2018-02-05 DIAGNOSIS — E236 Other disorders of pituitary gland: Secondary | ICD-10-CM

## 2018-02-05 DIAGNOSIS — I10 Essential (primary) hypertension: Secondary | ICD-10-CM | POA: Insufficient documentation

## 2018-02-05 DIAGNOSIS — M7541 Impingement syndrome of right shoulder: Secondary | ICD-10-CM

## 2018-02-05 DIAGNOSIS — I69398 Other sequelae of cerebral infarction: Secondary | ICD-10-CM

## 2018-02-05 DIAGNOSIS — M79604 Pain in right leg: Secondary | ICD-10-CM

## 2018-02-05 DIAGNOSIS — R350 Frequency of micturition: Secondary | ICD-10-CM

## 2018-02-05 DIAGNOSIS — R7303 Prediabetes: Secondary | ICD-10-CM | POA: Insufficient documentation

## 2018-02-05 DIAGNOSIS — I611 Nontraumatic intracerebral hemorrhage in hemisphere, cortical: Secondary | ICD-10-CM

## 2018-02-05 DIAGNOSIS — I629 Nontraumatic intracranial hemorrhage, unspecified: Secondary | ICD-10-CM | POA: Diagnosis not present

## 2018-02-05 DIAGNOSIS — R569 Unspecified convulsions: Secondary | ICD-10-CM | POA: Insufficient documentation

## 2018-02-05 DIAGNOSIS — R269 Unspecified abnormalities of gait and mobility: Secondary | ICD-10-CM | POA: Diagnosis not present

## 2018-02-05 DIAGNOSIS — I69249 Monoplegia of lower limb following other nontraumatic intracranial hemorrhage affecting unspecified side: Secondary | ICD-10-CM | POA: Diagnosis not present

## 2018-02-05 DIAGNOSIS — Z5189 Encounter for other specified aftercare: Secondary | ICD-10-CM | POA: Diagnosis present

## 2018-02-05 DIAGNOSIS — G8929 Other chronic pain: Secondary | ICD-10-CM

## 2018-02-05 DIAGNOSIS — M25511 Pain in right shoulder: Secondary | ICD-10-CM

## 2018-02-05 NOTE — Progress Notes (Signed)
Subjective:    Patient ID: [name], female    DOB: June 06, 1960, 57 y.o.   MRN: [id number]  HPI  57 year old RH-female with history of morbid obesity, HTN, iron deficiency anemia who presents for follow up after right temporal intracranial hemorrhage.  Last clinic visit 10/31/17.  Since last visit, pt went to ED for cough, notes reviewed. Since last visit, pt states she fell 3 weeks ago when her legs were tangled in a curtain. She has not gone to [facility] because she does not want to get into the water with "those dirty people". She states she started trying to ambulate more.  She did not obtain MRI that was ordered, but previous MRI reviewed, no acute abnormalities.  She has not lost any weight. Voltaren gel with benefits with shoulder pain. Her shoulder has significantly improved. BP is elevated. She did not follow up with Endo.   Pain Inventory Average Pain  10+ Pain Right Now 10+ My pain is sharp, burning, stabbing and aching  In the last 24 hours, has pain interfered with the following? General activity 10 Relation with others 10 Enjoyment of life 10 What TIME of day is your pain at its worst? all Sleep (in general) Poor  Pain is worse with: walking, bending, standing and some activites Pain improves with: heat/ice, pacing activities and medication Relief from Meds: 5  Mobility walk with assistance use a cane ability to climb steps?  no do you drive?  yes transfers alone  Function disabled: date disabled . I need assistance with the following:  household duties and shopping  Neuro/Psych trouble walking dizziness depression  Prior Studies Any changes since last visit?  no  Physicians involved in your care Any changes since last visit?  no   Family History  Problem Relation Age of Onset  . Diabetes Father   . Hypertension Father   . Hypertension Sister   . Stroke Brother    Social History   Socioeconomic History  . Marital status: Married    Spouse name:  Not on file  . Number of children: Not on file  . Years of education: Not on file  . Highest education level: Not on file  Occupational History  . Not on file  Social Needs  . Financial resource strain: Not on file  . Food insecurity:    Worry: Not on file    Inability: Not on file  . Transportation needs:    Medical: Not on file    Non-medical: Not on file  Tobacco Use  . Smoking status: Never Smoker  . Smokeless tobacco: Never Used  Substance and Sexual Activity  . Alcohol use: No  . Drug use: No  . Sexual activity: Not Currently  Lifestyle  . Physical activity:    Days per week: Not on file    Minutes per session: Not on file  . Stress: Not on file  Relationships  . Social connections:    Talks on phone: Not on file    Gets together: Not on file    Attends religious service: Not on file    Active member of club or organization: Not on file    Attends meetings of clubs or organizations: Not on file    Relationship status: Not on file  Other Topics Concern  . Not on file  Social History Narrative  . Not on file   Past Surgical History:  Procedure Laterality Date  . CARDIAC CATHETERIZATION  04/2010  Dr. Terrence Dupont   Past Medical History:  Diagnosis Date  . Headache   . Hypertension   . Iron deficiency anemia   . Seizures ([place])   . Stroke ([place])    BP (!) 174/94   Pulse 70   Resp 14   Ht 4\' 11"  (1.499 m)   Wt 290 lb (131.5 kg)   LMP 02/17/2011   SpO2 93%   BMI 58.57 kg/m   Opioid Risk Score:   Fall Risk Score:  `1  Depression screen PHQ 2/9  Depression screen [facility] 2/9 10/31/2017 10/15/2017 08/29/2017 07/16/2017 05/23/2017 02/27/2017 10/10/2016  Decreased Interest 1 2 0 1 2 1 1   Down, Depressed, Hopeless 1 1 0 1 2 1 1   PHQ - 2 Score 2 3 0 2 4 2 2   Altered sleeping - 2 - 2 2 - -  Tired, decreased energy - 1 - 1 2 - -  Change in appetite - 1 - 1 1 - -  Feeling bad or failure about yourself  - 1 - 1 2 - -  Trouble concentrating - 0 - 0 (No Data) - -  Moving  slowly or fidgety/restless - 1 - 0 1 - -  Suicidal thoughts - 0 - 0 (No Data) - -  PHQ-9 Score - 9 - 7 12 - -  Difficult doing work/chores - - - - - - -  Some recent data might be hidden    Review of Systems  Constitutional: Positive for diaphoresis and unexpected weight change.       Bladder control problems   HENT: Negative.   Eyes: Negative.   Respiratory: Positive for apnea, cough, shortness of breath and wheezing.   Cardiovascular: Positive for leg swelling.  Gastrointestinal: Positive for diarrhea.  Endocrine: Negative.   Genitourinary: Negative.   Musculoskeletal: Positive for arthralgias, back pain and gait problem.       Spasms   Skin: Negative.   Neurological: Positive for weakness.       Tingling    Hematological: Negative.   Psychiatric/Behavioral: Positive for confusion and dysphoric mood.  All other systems reviewed and are negative.     Objective:   Physical Exam Constitutional: She appears well-developed and well-nourished. NAD. Morbidly obese.  HENT: Normocephalic and atraumatic.  Eyes: EOMI. No discharge.  Cardiovascular: RRR. No JVD. Respiratory: Effort normal and breath sounds normal.  GI: Soft. Bowel sounds are normal.  Musculoskeletal:  Gait: Slow cadence with quad cane No edema. +Mild TTP right shoulder. Neurological: She is alert and oriented.  Motor: RUE: 4/5 throughout. RLE: 4/5 HF, 4/5 distally Sensation intact to light touch Skin: Skin is warm and dry. Intact.  Psychiatric: She has a normal mood and affect.      Assessment & Plan:  57 year old RH-female with history of morbid obesity, HTN, iron deficiency anemia who presents for follow up after right temporal intracranial hemorrhage.  1.S/p right temporal intracranial hemorrhage with dysesthesias  Side effects with gabapentin             Aquatic therapy denied by insurance, encouraged follow up at [facility] x5, pt states she does not like the water  MRI reviewed - stable infarct with  empty sella persistent  Cont meds  Cont Cymbalta               2. Depression     Reaction to Prozaac  Pt states she is managing with her chaplain and scriptures  Cont duloxetine.  3. Seizures  Cont meds  4. Morbid obesity  Cont follow up with dietitian             Cont to encourage weight loss again, no improvement, has not tried anything, reminded again  5. Muscle spasms             Encouraged ROM and stretching  Cont Robaxin 750 BID PRN  6. Abnormality of gait - post stroke             Cont cane  Therapies completed, cont HEP  Discussed environmental modifications  7. Right shoulder pain             Xrays reviewed previously with some degenerative change right shoulder             Cont Voltaren gel   Will consider steroid injection in future, she still does not want at present  Cont Voltaren gel  Significantly improved  8. Right leg pain  See #5, #7  Pt to discuss with Neuro for Naproxen  9. Falls  In sleep  Encouraged pt to push bed against wall or place furniture against bed to limit chances of rolling onto floor  See #6  Improving  10. 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 Urinary frequency  Mainly nocturnal  Encouraged decrease in nighttime intake and increase daytime fluid intake  13. ?GERD  Follow up with PCP

## 2018-02-05 NOTE — Addendum Note (Signed)
Addended by: Delice Lesch A on: 02/05/2018 11:48 AM   Modules accepted: Orders

## 2018-02-05 NOTE — Telephone Encounter (Signed)
Referral for TENS Unit was faxed to [facility] at [phone].

## 2018-02-06 ENCOUNTER — Telehealth: Payer: Self-pay | Admitting: *Deleted

## 2018-02-06 NOTE — Telephone Encounter (Signed)
Patient left a message stating she came in to clinic yesterday and was seen by [name]. She states she 'is hurting so bad'.  She says she does not know what to do. She asks, 'can [name] call me with some suggestions? Or should I go to the ER?'.  She states she did not sleep at all last night.

## 2018-02-08 ENCOUNTER — Other Ambulatory Visit: Payer: Self-pay

## 2018-02-08 ENCOUNTER — Encounter (HOSPITAL_COMMUNITY): Payer: Self-pay

## 2018-02-08 ENCOUNTER — Emergency Department (HOSPITAL_COMMUNITY): Payer: Medicaid Other

## 2018-02-08 ENCOUNTER — Emergency Department (HOSPITAL_COMMUNITY)
Admission: EM | Admit: 2018-02-08 | Discharge: 2018-02-08 | Disposition: A | Discharge status: Home / Self Care | Payer: Medicaid Other | Attending: Emergency Medicine | Admitting: Emergency Medicine

## 2018-02-08 DIAGNOSIS — I10 Essential (primary) hypertension: Secondary | ICD-10-CM | POA: Diagnosis not present

## 2018-02-08 DIAGNOSIS — Z79899 Other long term (current) drug therapy: Secondary | ICD-10-CM | POA: Insufficient documentation

## 2018-02-08 DIAGNOSIS — R52 Pain, unspecified: Secondary | ICD-10-CM

## 2018-02-08 DIAGNOSIS — M5416 Radiculopathy, lumbar region: Secondary | ICD-10-CM

## 2018-02-08 DIAGNOSIS — W19XXXA Unspecified fall, initial encounter: Secondary | ICD-10-CM

## 2018-02-08 DIAGNOSIS — M545 Low back pain: Secondary | ICD-10-CM | POA: Diagnosis present

## 2018-02-08 MED ORDER — HYDROCODONE-ACETAMINOPHEN 5-325 MG PO TABS
2.0000 | ORAL_TABLET | Freq: Once | ORAL | Status: AC
  Administered 2018-02-08: 2 via ORAL
  Filled 2018-02-08: qty 2

## 2018-02-08 MED ORDER — HYDROCODONE-ACETAMINOPHEN 5-325 MG PO TABS: 1.0000 | ORAL_TABLET | Freq: Four times a day (QID) | ORAL | 0 refills | Status: DC | PRN

## 2018-02-08 NOTE — ED Notes (Signed)
Pt returned from CT °

## 2018-02-08 NOTE — ED Notes (Signed)
ED Provider at bedside. 

## 2018-02-08 NOTE — Discharge Instructions (Signed)
Take Tylenol for mild pain or the pain medicine prescribed for bad pain.  Do not take Tylenol together with the pain medicine prescribed as the combination can be dangerous to your liver.  Follow-up with [name] if you continue to have significant pain in 3 or 4 days.  Use your walker to prevent falls

## 2018-02-08 NOTE — ED Triage Notes (Signed)
Pt arrives POV and reports a fall 3 weeks ago in her home. Pt endorses worseing pain to lower back and right leg. Pt denies hitting her head during the fall. Pt endorses hitting her back. Pt unsure if on blood thinners.

## 2018-02-08 NOTE — ED Notes (Signed)
RN ambulated with patient. Pt able to bear weight on legs and walk in the room. Pt confirmed she has a cane and walker at home.

## 2018-02-08 NOTE — ED Notes (Signed)
Patient transported to CT 

## 2018-02-08 NOTE — ED Provider Notes (Signed)
[facility] EMERGENCY DEPARTMENT Provider Note   CSN: [id number] Arrival date & time: 02/08/18  1025     History   Chief Complaint Chief Complaint  Patient presents with  . Leg Pain    HPI [name] is a 57 y.o. female.  Patient fell 2 weeks ago.  Mechanical fall .  Since the fall she complains of low back pain radiating to the left lower extremity to the thigh, knee and lower leg.  Pain worse with walking and improved with remaining still.  She is treated herself with Tylenol, topical gel.  Ice, and a muscle relaxer without much relief.  She saw [name] a few days ago for the same complaint.  He suggested to continue Robaxin as needed.  Continue to walk with a cane, Voltaren gel and arrange for home health Previous visit with [name] she complained of shoulder pain which has resolved.  She has been ambulatory with her cane. HPI  Past Medical History:  Diagnosis Date  . Headache   . Hypertension   . Iron deficiency anemia   . Seizures ([place])   . Stroke [facility])     Patient Active Problem List   Diagnosis Date Noted  . Morbid obesity due to excess calories ([place]) 02/05/2018  . 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 History of stroke 07/16/2017  . Headache 04/02/2017  . Seizure disorder ([place]) 10/02/2016  . Obstructive sleep apnea 11/29/2015  . Right shoulder pain 11/29/2015  . Adjustment disorder with depressed mood 10/19/2015  . Nocturnal enuresis   . Hypokalemia   . Anemia, iron deficiency   . Super obese   . Prediabetes   . Orthostatic hypotension   . Supplemental oxygen dependent   . Diastolic dysfunction   . Tachypnea   . AKI (acute kidney injury) ([place])   . Acute blood loss anemia   . Lethargy   . Wheezing   . Benign essential HTN   . ICH (intracerebral hemorrhage) ([place]) 10/06/2015    Past Surgical History:  Procedure Laterality Date  . CARDIAC CATHETERIZATION  04/2010   Dr. Terrence Dupont     OB  History   None      Home Medications    Prior to Admission medications   Medication Sig Start Date End Date Taking? Authorizing Provider  acetaminophen (TYLENOL) 500 MG tablet Take 500 mg by mouth every 6 (six) hours as needed for headache (pain).    [provider]  atorvastatin (LIPITOR) 20 MG tablet Take 1 tablet (20 mg total) by mouth daily. 10/15/17   Argentina Donovan, PA-C  benzonatate (TESSALON) 100 MG capsule Take 1 capsule (100 mg total) by mouth every 8 (eight) hours. 01/21/18   Vanessa Kick, MD  cetirizine (ZYRTEC) 10 MG tablet Take 10 mg by mouth daily as needed (allergic reaction).    [provider]  chlorthalidone (HYGROTON) 25 MG tablet Take 1 tablet (25 mg total) by mouth daily. 10/15/17   Argentina Donovan, PA-C  diclofenac sodium (VOLTAREN) 1 % GEL Apply 2 g topically 4 (four) times daily. 05/23/17   Charlott Rakes, MD  diphenhydrAMINE (BENADRYL) 25 MG tablet Take 25 mg by mouth daily as needed for allergies (bee stings).    [provider]  DULoxetine (CYMBALTA) 60 MG capsule TAKE 1 CAPSULE DAILY BY MOUTH. 11/25/17   Patel, Domenick Bookbinder, MD  fluticasone [facility]) 50 MCG/ACT nasal spray Place 1 spray into both nostrils daily as needed for allergies  or rhinitis. 05/23/17   Charlott Rakes, MD  hydroxypropyl methylcellulose / hypromellose (ISOPTO TEARS / GONIOVISC) 2.5 % ophthalmic solution Place 2 drops into both eyes as needed (irritation). 05/23/17   Charlott Rakes, MD  levETIRAcetam (KEPPRA) 500 MG tablet TAKE 1 & 1/2 TABLETS BY MOUTH TWICE DAILY. Patient taking differently: Take 750 mg by mouth 2 (two) times daily.  04/02/17   Dennie Bible, NP  meclizine (ANTIVERT) 25 MG tablet TAKE 1 TABLET (25 MG TOTAL) BY MOUTH 2 (TWO) TIMES DAILY AS NEEDED FOR DIZZINESS. 12/16/17   Charlott Rakes, MD  metFORMIN (GLUCOPHAGE XR) 500 MG 24 hr tablet Take 1 tablet (500 mg total) by mouth 2 (two) times daily. 10/15/17   Argentina Donovan, PA-C  methocarbamol  (ROBAXIN) 750 MG tablet TAKE 1 TABLET BY MOUTH 2 TIMES DAILY AS NEEDED FOR MUSCLE SPASMS. 10/21/17   Jamse Arn, MD  metoprolol tartrate (LOPRESSOR) 100 MG tablet Take 1 tablet (100 mg total) by mouth 2 (two) times daily. 10/15/17   Argentina Donovan, PA-C  Vitamin D, Ergocalciferol, (DRISDOL) 50000 units CAPS capsule Take 1 capsule (50,000 Units total) by mouth every 7 (seven) days. 10/16/17   Argentina Donovan, PA-C    Family History Family History  Problem Relation Age of Onset  . Diabetes Father   . Hypertension Father   . Hypertension Sister   . Stroke Brother     Social History Social History   Tobacco Use  . Smoking status: Never Smoker  . Smokeless tobacco: Never Used  Substance Use Topics  . Alcohol use: No  . Drug use: No     Allergies   Bee venom; Eggs or egg-derived products; Gabapentin; and Gadolinium derivatives   Review of Systems Review of Systems  Constitutional: Negative.   HENT: Negative.   Respiratory: Negative.   Cardiovascular: Negative.   Gastrointestinal: Negative.   Musculoskeletal: Positive for arthralgias, back pain and gait problem.       Walks with cane or walker  Skin: Negative.   Psychiatric/Behavioral: Negative.   All other systems reviewed and are negative.    Physical Exam Updated Vital Signs BP (!) 142/83 (BP Location: Right Arm)   Pulse 70   Temp 98.1 F (36.7 C) (Oral)   Resp 19   Ht 4\' 11"  (1.499 m)   Wt 131.5 kg   LMP 02/17/2011   SpO2 98%   BMI 58.57 kg/m   Physical Exam  Constitutional: She appears well-developed and well-nourished.  Appears uncomfortable.  HENT:  Head: Normocephalic and atraumatic.  Eyes: Pupils are equal, round, and reactive to light. Conjunctivae are normal.  Neck: Neck supple. No tracheal deviation present. No thyromegaly present.  Cardiovascular: Normal rate and regular rhythm.  No murmur heard. Pulmonary/Chest: Effort normal and breath sounds normal.  Abdominal: Soft. Bowel sounds  are normal. She exhibits no distension. There is no tenderness.  Morbidly obese  Musculoskeletal: Normal range of motion. She exhibits no edema or tenderness.  Prior spine is nontender.  His pain at lumbar spine when sitting up in bed pelvis stable, nontender.  Right lower extremity without deformity.  There is pain on passive internal rotation of the thigh.  Knee is without swelling or deformity.  She is mildly tender anteriorly at the knee.  Foot and ankle and lower leg nontender.  DP pulse 2+ good capillary refill.  All other extremities no contusion abrasion or tenderness neurovascularly intact  Neurological: She is alert. Coordination normal.  Skin: Skin is warm  and dry. No rash noted.  Psychiatric: She has a normal mood and affect.  Nursing note and vitals reviewed.    ED Treatments / Results  Labs (all labs ordered are listed, but only abnormal results are displayed) Labs Reviewed - No data to display  EKG None  Radiology No results found.  Procedures Procedures (including critical care time)  Medications Ordered in ED Medications  HYDROcodone-acetaminophen (NORCO/VICODIN) 5-325 MG per tablet 2 tablet (2 tablets Oral Given 02/08/18 1055)    2:50 PM pain is improved after treatment with Norco.  She is alert able to ambulate with minimal assistance  X-rays viewed by me Results for orders placed or performed in visit on 10/15/17  Comprehensive metabolic panel  Result Value Ref Range   Glucose 177 (H) 65 - 99 mg/dL   BUN 17 6 - 24 mg/dL   Creatinine, Ser 1.03 (H) 0.57 - 1.00 mg/dL   GFR calc non Af Amer 61 >59 mL/min/1.73   GFR calc Af Amer 70 >59 mL/min/1.73   BUN/Creatinine Ratio 17 9 - 23   Sodium 142 134 - 144 mmol/L   Potassium 3.7 3.5 - 5.2 mmol/L   Chloride 98 96 - 106 mmol/L   CO2 27 20 - 29 mmol/L   Calcium 9.3 8.7 - 10.2 mg/dL   Total Protein 7.7 6.0 - 8.5 g/dL   Albumin 3.6 3.5 - 5.5 g/dL   Globulin, Total 4.1 1.5 - 4.5 g/dL   Albumin/Globulin Ratio 0.9  (L) 1.2 - 2.2   Bilirubin Total 0.3 0.0 - 1.2 mg/dL   Alkaline Phosphatase 73 39 - 117 IU/L   AST 14 0 - 40 IU/L   ALT 12 0 - 32 IU/L  Hemoglobin A1c  Result Value Ref Range   Hgb A1c MFr Bld 6.5 (H) 4.8 - 5.6 %   Est. average glucose Bld gHb Est-mCnc 140 mg/dL  Lipid panel  Result Value Ref Range   Cholesterol, Total 74 (L) 100 - 199 mg/dL   Triglycerides 86 0 - 149 mg/dL   HDL 33 (L) >39 mg/dL   VLDL Cholesterol Cal 17 5 - 40 mg/dL   LDL Calculated 24 0 - 99 mg/dL   Chol/HDL Ratio 2.2 0.0 - 4.4 ratio  TSH  Result Value Ref Range   TSH 1.770 0.450 - 4.500 uIU/mL  Vitamin D, 25-hydroxy  Result Value Ref Range   Vit D, 25-Hydroxy 13.5 (L) 30.0 - 100.0 ng/mL  Glucose (CBG)  Result Value Ref Range   POC Glucose 177 (A) 70 - 99 mg/dl   Dg Chest 2 View  Result Date: 01/21/2018 CLINICAL DATA:  Shortness of breath, cough EXAM: CHEST - 2 VIEW COMPARISON:  04/21/2017, 10/06/2015 FINDINGS: There is no focal parenchymal opacity. There is no pleural effusion or pneumothorax. There is stable cardiomegaly. The osseous structures are unremarkable. IMPRESSION: No acute cardiopulmonary disease. Electronically Signed   By: Kathreen Devoid   On: 01/21/2018 16:23   Dg Lumbar Spine Complete  Result Date: 02/08/2018 CLINICAL DATA:  Fall 3 weeks ago with persistent pain, initial encounter EXAM: LUMBAR SPINE - COMPLETE 4+ VIEW COMPARISON:  None. FINDINGS: Five lumbar type vertebral bodies are well visualized. Vertebral body height is well maintained. Mild osteophytic changes are noted. No anterolisthesis is noted. Mild aortic calcifications are noted. No soft tissue abnormality is noted. IMPRESSION: Mild degenerative change without acute abnormality. Electronically Signed   By: Inez Catalina M.D.   On: 02/08/2018 12:16   Ct Hip Right Wo Contrast  Result Date: 02/08/2018 CLINICAL DATA:  Worsening right hip pain since fall 3 weeks ago. EXAM: CT OF THE RIGHT HIP WITHOUT CONTRAST TECHNIQUE: Multidetector CT  imaging of the right hip was performed according to the standard protocol. Multiplanar CT image reconstructions were also generated. COMPARISON:  Right hip x-rays from same day. FINDINGS: Bones/Joint/Cartilage No fracture or dislocation. Normal alignment. No joint effusion. Mild right hip joint space narrowing with small marginal osteophytes. Subchondral cystic change in the posterior acetabulum. Ligaments Ligaments are suboptimally evaluated by CT. Muscles and Tendons Grossly unremarkable. Soft tissue No fluid collection or hematoma.  No soft tissue mass. IMPRESSION: 1.  No acute osseous abnormality. 2. Mild right hip osteoarthritis. Electronically Signed   By: Titus Dubin M.D.   On: 02/08/2018 14:36   Dg Knee Complete 4 Views Right  Result Date: 02/08/2018 CLINICAL DATA:  Fall through is ago with right knee pain, initial encounter EXAM: RIGHT KNEE - COMPLETE 4+ VIEW COMPARISON:  None. FINDINGS: No acute fracture or dislocation is noted. Medial joint space narrowing is noted. Osteophytic changes are noted medially as well as within the patellofemoral space. No joint effusion is noted. No soft tissue changes are seen. IMPRESSION: Degenerative change without acute abnormality. Electronically Signed   By: Inez Catalina M.D.   On: 02/08/2018 12:21   Dg Hip Unilat With Pelvis 2-3 Views Right  Result Date: 02/08/2018 CLINICAL DATA:  Fall 3 weeks ago with right hip pain, initial encounter EXAM: DG HIP (WITH OR WITHOUT PELVIS) 3V RIGHT COMPARISON:  None. FINDINGS: Degenerative changes of the hip joints are noted. No acute fracture or dislocation is noted. No soft tissue abnormality is noted. IMPRESSION: Degenerative changes of the hip joint without acute abnormality. Electronically Signed   By: Inez Catalina M.D.   On: 02/08/2018 12:20   Initial Impression / Assessment and Plan / ED Course  I have reviewed the triage vital signs and the nursing notes.  Pertinent labs & imaging results that were available  during my care of the patient were reviewed by me and considered in my medical decision making (see chart for details).    Plan prescription Norco.  Follow up with Dr. Gregery [place] Controlled Substance reporting System queried Symptoms consistent with lumbar radiculopathy Final Clinical Impressions(s) / ED Diagnoses  Diagnosis #1 fall #2 lumbar radiculopathy Final diagnoses:  None    ED Discharge Orders    None       Orlie Dakin, MD 02/08/18 1538

## 2018-02-08 NOTE — ED Notes (Signed)
Patient verbalizes understanding of discharge instructions. Opportunity for questioning and answers were provided. Armband removed by staff, pt discharged from ED. Pt wheeled to lobby. Pt taken home by family.  

## 2018-02-11 MED FILL — METFORMIN HCL ER 500 MG TAB: 500 | 90 days supply | Qty: 180 | Fill #1

## 2018-02-11 MED FILL — ATORVASTATIN 20 MG TABLET: 20 | 30 days supply | Qty: 30 | Fill #3

## 2018-02-12 NOTE — Telephone Encounter (Signed)
I believe the last time I saw her was 11/14.  At that time, there was no complaint of disabling pain.  In fact, her shoulder had improved.  If this is related to her leg, she can try OTC Lidocaine patch.  I also instructed her to follow up with Neurology regarding Naproxen because this works well for her, but she was told never to take it again, per pt.  Being more active and losing weight will also help significantly.

## 2018-02-18 MED FILL — METOPROLOL TARTRATE 100 MG: 100 | 30 days supply | Qty: 60 | Fill #1

## 2018-02-18 MED FILL — MECLIZINE 25 MG TABLET: 25 | 30 days supply | Qty: 60 | Fill #1

## 2018-02-24 ENCOUNTER — Encounter: Payer: Self-pay | Admitting: Endocrinology

## 2018-02-24 ENCOUNTER — Ambulatory Visit (INDEPENDENT_AMBULATORY_CARE_PROVIDER_SITE_OTHER): Payer: Medicaid Other | Admitting: Endocrinology

## 2018-02-24 VITALS — BP 158/82 | HR 87 | Ht 59.0 in | Wt 288.2 lb

## 2018-02-24 DIAGNOSIS — E236 Other disorders of pituitary gland: Secondary | ICD-10-CM

## 2018-02-24 DIAGNOSIS — E1165 Type 2 diabetes mellitus with hyperglycemia: Secondary | ICD-10-CM | POA: Diagnosis not present

## 2018-02-24 LAB — CORTISOL
CORTISOL PLASMA: 26.8 ug/dL
Cortisol, Plasma: 13.4 ug/dL

## 2018-02-24 LAB — BASIC METABOLIC PANEL
BUN: 19 mg/dL (ref 6–23)
CALCIUM: 9.2 mg/dL (ref 8.4–10.5)
CO2: 33 meq/L — AB (ref 19–32)
CREATININE: 0.95 mg/dL (ref 0.40–1.20)
Chloride: 97 mEq/L (ref 96–112)
GFR: 77.96 mL/min (ref >60.00)
Glucose, Bld: 134 mg/dL — ABNORMAL HIGH (ref 70–99)
Potassium: 3.2 mEq/L — ABNORMAL LOW (ref 3.5–5.1)
Sodium: 138 mEq/L (ref 135–145)

## 2018-02-24 LAB — URINALYSIS, ROUTINE W REFLEX MICROSCOPIC
Bilirubin Urine: NEGATIVE
Hgb urine dipstick: NEGATIVE
Leukocytes, UA: NEGATIVE
Nitrite: NEGATIVE
PH: 5 (ref 5.0–8.0)
RBC / HPF: NONE SEEN (ref 0–?)
SPECIFIC GRAVITY, URINE: 1.025 (ref 1.000–1.030)
Total Protein, Urine: NEGATIVE
UROBILINOGEN UA: 0.2 (ref 0.0–1.0)
Urine Glucose: NEGATIVE

## 2018-02-24 LAB — T4, FREE: FREE T4: 0.81 ng/dL (ref 0.60–1.60)

## 2018-02-24 LAB — TSH: TSH: 1.99 u[IU]/mL (ref 0.35–4.50)

## 2018-02-24 MED ORDER — COSYNTROPIN NICU IV SYRINGE 0.25 MG/ML (STANDARD DOSE)
0.2500 mg | Freq: Once | INTRAVENOUS | Status: AC
  Administered 2018-02-24: 0.25 mg via INTRAVENOUS

## 2018-02-24 NOTE — Progress Notes (Signed)
Subjective:    Patient ID: [name], female    DOB: 1960/08/21, 57 y.o.   MRN: [id number]  HPI Pt is referred by Dr Margarita Rana, for empty sella.  In 2017, pt had MRI of the brain.  She presented with severe weakness at the RUE, and assoc mild weakness of the RLE.  Empty sella was incidentally noted.    Past Medical History:  Diagnosis Date  . Headache   . Hypertension   . Iron deficiency anemia   . Seizures ([place])   . Stroke [facility])     Past Surgical History:  Procedure Laterality Date  . CARDIAC CATHETERIZATION  04/2010   Dr. Terrence Dupont    Social History   Socioeconomic History  . Marital status: Married    Spouse name: Not on file  . Number of children: Not on file  . Years of education: Not on file  . Highest education level: Not on file  Occupational History  . Not on file  Social Needs  . Financial resource strain: Not on file  . Food insecurity:    Worry: Not on file    Inability: Not on file  . Transportation needs:    Medical: Not on file    Non-medical: Not on file  Tobacco Use  . Smoking status: Never Smoker  . Smokeless tobacco: Never Used  Substance and Sexual Activity  . Alcohol use: No  . Drug use: No  . Sexual activity: Not Currently  Lifestyle  . Physical activity:    Days per week: Not on file    Minutes per session: Not on file  . Stress: Not on file  Relationships  . Social connections:    Talks on phone: Not on file    Gets together: Not on file    Attends religious service: Not on file    Active member of club or organization: Not on file    Attends meetings of clubs or organizations: Not on file    Relationship status: Not on file  . Intimate partner violence:    Fear of current or ex partner: Not on file    Emotionally abused: Not on file    Physically abused: Not on file    Forced sexual activity: Not on file  Other Topics Concern  . Not on file  Social History Narrative  . Not on file    Current Outpatient Medications on File  Prior to Visit  Medication Sig Dispense Refill  . acetaminophen (TYLENOL) 500 MG tablet Take 500 mg by mouth every 6 (six) hours as needed for headache (pain).    Marland Kitchen atorvastatin (LIPITOR) 20 MG tablet Take 1 tablet (20 mg total) by mouth daily. 30 tablet 3  . benzonatate (TESSALON) 100 MG capsule Take 1 capsule (100 mg total) by mouth every 8 (eight) hours. 21 capsule 0  . cetirizine (ZYRTEC) 10 MG tablet Take 10 mg by mouth daily as needed (allergic reaction).    . chlorthalidone (HYGROTON) 25 MG tablet Take 1 tablet (25 mg total) by mouth daily. 30 tablet 3  . diclofenac sodium (VOLTAREN) 1 % GEL Apply 2 g topically 4 (four) times daily. 1 Tube 3  . diphenhydrAMINE (BENADRYL) 25 MG tablet Take 25 mg by mouth daily as needed for allergies (bee stings).    . fluticasone (FLONASE) 50 MCG/ACT nasal spray Place 1 spray into both nostrils daily as needed for allergies or rhinitis. 16 g 1  . HYDROcodone-acetaminophen (NORCO) 5-325 MG tablet Take 1-2  tablets by mouth every 6 (six) hours as needed for severe pain. 10 tablet 0  . hydroxypropyl methylcellulose / hypromellose (ISOPTO TEARS / GONIOVISC) 2.5 % ophthalmic solution Place 2 drops into both eyes as needed (irritation). 15 mL 1  . levETIRAcetam (KEPPRA) 500 MG tablet TAKE 1 & 1/2 TABLETS BY MOUTH TWICE DAILY. (Patient taking differently: Take 750 mg by mouth 2 (two) times daily. ) 90 tablet 8  . meclizine (ANTIVERT) 25 MG tablet TAKE 1 TABLET (25 MG TOTAL) BY MOUTH 2 (TWO) TIMES DAILY AS NEEDED FOR DIZZINESS. 60 tablet 1  . metFORMIN (GLUCOPHAGE XR) 500 MG 24 hr tablet Take 1 tablet (500 mg total) by mouth 2 (two) times daily. 180 tablet 3  . metoprolol tartrate (LOPRESSOR) 100 MG tablet Take 1 tablet (100 mg total) by mouth 2 (two) times daily. 60 tablet 3  . Vitamin D, Ergocalciferol, (DRISDOL) 50000 units CAPS capsule Take 1 capsule (50,000 Units total) by mouth every 7 (seven) days. 16 capsule 0   No current facility-administered medications on  file prior to visit.     Allergies  Allergen Reactions  . Bee Venom Swelling    Facial swelling  . Eggs Or Egg-Derived Products Nausea And Vomiting and Swelling    Mouth swelled  . Gabapentin     Jittery feeling, dyspnea, crawling sensation  . Gadolinium Derivatives Itching    Immediately after gad injection pt. complained of tongue numbness and lower lip/ then had rt side of body tingling/ pt. Was assessed by Dr. Jeralyn Ruths before leaving/ no medications were administered/    Family History  Problem Relation Age of Onset  . Diabetes Father   . Hypertension Father   . Hypertension Sister   . Stroke Brother     BP (!) 158/82 (BP Location: Right Arm, Patient Position: Sitting, Cuff Size: Large)   Pulse 87   Ht 4\' 11"  (1.499 m)   Wt 288 lb 3.2 oz (130.7 kg)   LMP 02/17/2011   SpO2 97%   BMI 58.21 kg/m    Review of Systems denies loss of smell, menopausal sxs, syncope, visual loss, galactorrhea, easy bruising, change in facial appearance, rhinorrhea, and n/v.  She has nocturia approx 6 times per night.  She has dry skin, weight gain, headache, and depression.       Objective:   Physical Exam VS: see vs page GEN: no distress.  Morbid obesity HEAD: head: no deformity eyes: no periorbital swelling, no proptosis external nose and ears are normal mouth: no lesion seen NECK: supple, thyroid is not enlarged CHEST WALL: no deformity LUNGS: clear to auscultation CV: reg rate and rhythm, no murmur ABD: abdomen is soft, nontender.  no hepatosplenomegaly.  not distended.  no hernia MUSCULOSKELETAL: muscle bulk and strength are grossly normal on the left side, and slightly decreased on the right.  no obvious joint swelling.  gait is slow but steady with a walker.   EXTEMITIES: no deformity.  no ulcer on the feet.  feet are of normal color and temp.  1+ bilat edema PULSES: dorsalis pedis intact bilat.  no carotid bruit NEURO:  cn 2-12 grossly intact.   readily moves all 4's.  sensation  is intact to touch on the feet SKIN:  Normal texture and temperature.  No rash or suspicious lesion is visible.   NODES:  None palpable at the neck PSYCH: alert, well-oriented.  Does not appear anxious nor depressed.  I have reviewed outside records, and summarized:  Pt was noted  to have empty sella, and referred here.  She has been seen in ER a number of times, but there is not a common thread connecting the visits.     MRI: Premature atrophy. Extensive small vessel disease. No acute intracranial findings.  Old hemorrhage RIGHT posterior temporal lobe, hemosiderin lined cavity, consistent with a remote hypertensive related bleed.  Marked empty sella with expansion--first noted in 2017.      Assessment & Plan:  Empty sella: this probably predates ICH.  Plan is to check pituitary function tests.   Weight gain: unlikely pituitary-related Nocturia: new to me.  We'll check 24-HR urine volume  Patient Instructions  blood tests are requested for you today.  We'll let you know about the results. Please measure a 24-HR urine amount, and let us know.   If these are normal, no further testing or treatment is needed for the pituitary.

## 2018-02-24 NOTE — Progress Notes (Signed)
Per orders of Dr Loanne Drilling injection of 0.25 mg cortrosyn given today by Lolita Rieger RMA . Patient tolerated injection well.

## 2018-02-24 NOTE — Patient Instructions (Signed)
blood tests are requested for you today.  We'll let you know about the results. Please measure a 24-HR urine amount, and let us know.   If these are normal, no further testing or treatment is needed for the pituitary.

## 2018-02-25 LAB — FOLLICLE STIMULATING HORMONE: FSH: 23.7 m[IU]/mL

## 2018-02-25 LAB — LUTEINIZING HORMONE: LH: 16.98 m[IU]/mL

## 2018-02-27 ENCOUNTER — Other Ambulatory Visit: Payer: Self-pay

## 2018-02-27 LAB — ACTH: C206 ACTH: 17 pg/mL (ref 6–50)

## 2018-02-27 LAB — PROLACTIN: PROLACTIN: 5.6 ng/mL

## 2018-02-27 MED ORDER — METHOCARBAMOL 750 MG PO TABS: ORAL_TABLET | ORAL | 1 refills | Status: DC

## 2018-02-27 MED ORDER — DULOXETINE HCL 60 MG PO CPEP: ORAL_CAPSULE | ORAL | 1 refills | Status: DC

## 2018-02-27 MED FILL — DULoxetine HCL 60 MG CPEP: 60 | 30 days supply | Qty: 30 | Fill #0

## 2018-02-27 MED FILL — METHOCARBAMOL 750 MG TABS: 750 | 30 days supply | Qty: 60 | Fill #0

## 2018-03-01 ENCOUNTER — Encounter (HOSPITAL_COMMUNITY): Payer: Self-pay

## 2018-03-01 ENCOUNTER — Other Ambulatory Visit: Payer: Self-pay

## 2018-03-01 ENCOUNTER — Emergency Department (HOSPITAL_COMMUNITY)
Admission: EM | Admit: 2018-03-01 | Discharge: 2018-03-01 | Disposition: A | Discharge status: Home / Self Care | Payer: Medicaid Other | Attending: Emergency Medicine | Admitting: Emergency Medicine

## 2018-03-01 DIAGNOSIS — M25551 Pain in right hip: Secondary | ICD-10-CM

## 2018-03-01 DIAGNOSIS — Z6841 Body Mass Index (BMI) 40.0 and over, adult: Secondary | ICD-10-CM | POA: Insufficient documentation

## 2018-03-01 DIAGNOSIS — I10 Essential (primary) hypertension: Secondary | ICD-10-CM | POA: Diagnosis not present

## 2018-03-01 DIAGNOSIS — M5410 Radiculopathy, site unspecified: Secondary | ICD-10-CM | POA: Diagnosis not present

## 2018-03-01 DIAGNOSIS — Z79899 Other long term (current) drug therapy: Secondary | ICD-10-CM | POA: Insufficient documentation

## 2018-03-01 MED ORDER — PREDNISONE 20 MG PO TABS
60.0000 mg | ORAL_TABLET | Freq: Once | ORAL | Status: AC
  Administered 2018-03-01: 60 mg via ORAL
  Filled 2018-03-01: qty 3

## 2018-03-01 MED ORDER — MORPHINE SULFATE (PF) 4 MG/ML IV SOLN
4.0000 mg | Freq: Once | INTRAVENOUS | Status: AC
  Administered 2018-03-01: 4 mg via INTRAMUSCULAR
  Filled 2018-03-01: qty 1

## 2018-03-01 MED ORDER — LIDOCAINE 5 % EX PTCH: 1.0000 | MEDICATED_PATCH | CUTANEOUS | 0 refills | Status: DC

## 2018-03-01 MED ORDER — PREDNISONE 10 MG PO TABS: 40.0000 mg | ORAL_TABLET | Freq: Every day | ORAL | 0 refills | Status: AC

## 2018-03-01 NOTE — ED Provider Notes (Signed)
[facility] EMERGENCY DEPARTMENT Provider Note   CSN: [id number] Arrival date & time: 03/01/18  1403     History   Chief Complaint Chief Complaint  Patient presents with  . Hip Pain    HPI [name] is a 57 y.o. female with medical history as below who presents the emergent department today for right hip/gluteal pain that radiates down her leg.  Patient was seen here on 11/17 after a fall that occurred 2 weeks prior.  This was a mechanical fall and she complained of pain in her right lower back/gluteus, right hip, back of the leg and into her knee.  She is seen her neurologist a few days before and was given Robaxin.  She reports that this helped mildly but did not relieve the pain.  During her visit on 11/17 patient received multiple imaging including an x-ray of the lumbar spine, right hip as well as the right knee.  These were all unremarkable.  Patient also underwent a CT scan of the right hip that was unremarkable.  She was discharged home with a short course of pain medication instructions and instructed to follow-up with primary care.  Patient reports she has not followed up.  She has had continued pain in her right gluteus that now radiates down the back of her right leg, around her calf and into her foot.  She notes that this typically occurs when walking or bending.  She notes an associated tingling sensation.  No numbness or weakness.  She has been trying her muscle relaxer and aspirin for this which she notes is helping her be able to sleep but does not fully resolve her pain.  Patient denies any further falls or injuries.  She denies any back pain.  Patient denies any abdominal pain or urinary symptoms.  Patient denies any fever, IV drug use, spinal injections, spinal manipulation/procedures, bowel/bladder incontinence, urine retention, saddle anesthesia, weakness of the lower extremities, difficulty with gait, lower extremity swelling.  No chest pain or shortness  of breath.    HPI  Past Medical History:  Diagnosis Date  . Headache   . Hypertension   . Iron deficiency anemia   . Seizures ([place])   . Stroke [facility])     Patient Active Problem List   Diagnosis Date Noted  . Empty sella ([place]) 02/24/2018  . Morbid obesity due to excess calories ([place]) 02/05/2018  . 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 History of stroke 07/16/2017  . Headache 04/02/2017  . Seizure disorder ([place]) 10/02/2016  . Obstructive sleep apnea 11/29/2015  . Right shoulder pain 11/29/2015  . Adjustment disorder with depressed mood 10/19/2015  . Nocturnal enuresis   . Hypokalemia   . Anemia, iron deficiency   . Super obese   . Prediabetes   . Orthostatic hypotension   . Supplemental oxygen dependent   . Diastolic dysfunction   . Tachypnea   . AKI (acute kidney injury) ([place])   . Acute blood loss anemia   . Lethargy   . Wheezing   . Benign essential HTN   . ICH (intracerebral hemorrhage) ([place]) 10/06/2015    Past Surgical History:  Procedure Laterality Date  . CARDIAC CATHETERIZATION  04/2010   Dr. Terrence Dupont     OB History   None      Home Medications    Prior to Admission medications   Medication Sig Start Date End Date Taking? Authorizing Provider  acetaminophen (TYLENOL) 500 MG tablet Take  500 mg by mouth every 6 (six) hours as needed for headache (pain).    [provider]  atorvastatin (LIPITOR) 20 MG tablet Take 1 tablet (20 mg total) by mouth daily. 10/15/17   Argentina Donovan, PA-C  benzonatate (TESSALON) 100 MG capsule Take 1 capsule (100 mg total) by mouth every 8 (eight) hours. 01/21/18   Vanessa Kick, MD  cetirizine (ZYRTEC) 10 MG tablet Take 10 mg by mouth daily as needed (allergic reaction).    [provider]  chlorthalidone (HYGROTON) 25 MG tablet Take 1 tablet (25 mg total) by mouth daily. 10/15/17   Argentina Donovan, PA-C  diclofenac sodium (VOLTAREN) 1 % GEL Apply 2 g topically 4  (four) times daily. 05/23/17   Charlott Rakes, MD  diphenhydrAMINE (BENADRYL) 25 MG tablet Take 25 mg by mouth daily as needed for allergies (bee stings).    [provider]  DULoxetine (CYMBALTA) 60 MG capsule TAKE 1 CAPSULE DAILY BY MOUTH. 02/27/18   Patel, Domenick Bookbinder, MD  fluticasone [facility]) 50 MCG/ACT nasal spray Place 1 spray into both nostrils daily as needed for allergies or rhinitis. 05/23/17   Charlott Rakes, MD  HYDROcodone-acetaminophen (NORCO) 5-325 MG tablet Take 1-2 tablets by mouth every 6 (six) hours as needed for severe pain. 02/08/18   Orlie Dakin, MD  hydroxypropyl methylcellulose / hypromellose (ISOPTO TEARS / GONIOVISC) 2.5 % ophthalmic solution Place 2 drops into both eyes as needed (irritation). 05/23/17   Charlott Rakes, MD  levETIRAcetam (KEPPRA) 500 MG tablet TAKE 1 & 1/2 TABLETS BY MOUTH TWICE DAILY. Patient taking differently: Take 750 mg by mouth 2 (two) times daily.  04/02/17   Dennie Bible, NP  meclizine (ANTIVERT) 25 MG tablet TAKE 1 TABLET (25 MG TOTAL) BY MOUTH 2 (TWO) TIMES DAILY AS NEEDED FOR DIZZINESS. 12/16/17   Charlott Rakes, MD  metFORMIN (GLUCOPHAGE XR) 500 MG 24 hr tablet Take 1 tablet (500 mg total) by mouth 2 (two) times daily. 10/15/17   Argentina Donovan, PA-C  methocarbamol (ROBAXIN) 750 MG tablet TAKE 1 TABLET BY MOUTH 2 TIMES DAILY AS NEEDED FOR MUSCLE SPASMS. 02/27/18   Jamse Arn, MD  metoprolol tartrate (LOPRESSOR) 100 MG tablet Take 1 tablet (100 mg total) by mouth 2 (two) times daily. 10/15/17   Argentina Donovan, PA-C  Vitamin D, Ergocalciferol, (DRISDOL) 50000 units CAPS capsule Take 1 capsule (50,000 Units total) by mouth every 7 (seven) days. 10/16/17   Argentina Donovan, PA-C    Family History Family History  Problem Relation Age of Onset  . Diabetes Father   . Hypertension Father   . Hypertension Sister   . Stroke Brother     Social History Social History   Tobacco Use  . Smoking status: Never Smoker  .  Smokeless tobacco: Never Used  Substance Use Topics  . Alcohol use: No  . Drug use: No     Allergies   Bee venom; Eggs or egg-derived products; Gabapentin; and Gadolinium derivatives   Review of Systems Review of Systems  All other systems reviewed and are negative.    Physical Exam Updated Vital Signs BP (!) 143/82 (BP Location: Right Arm)   Pulse 70   Temp 98.3 F (36.8 C) (Oral)   Resp 20   Ht 4\' 11"  (1.499 m)   Wt 130.6 kg   LMP 02/17/2011   SpO2 97%   BMI 58.17 kg/m   Physical Exam  Constitutional: She appears well-developed and well-nourished. No distress.  Obese  female in NAD. Non-toxic appearing  HENT:  Head: Normocephalic and atraumatic.  Right Ear: External ear normal.  Left Ear: External ear normal.  Neck: Normal range of motion. Neck supple. No spinous process tenderness present. No neck rigidity. Normal range of motion present.  Cardiovascular: Normal rate, regular rhythm, normal heart sounds and intact distal pulses.  No murmur heard. Pulses:      Radial pulses are 2+ on the right side, and 2+ on the left side.       Femoral pulses are 2+ on the right side, and 2+ on the left side.      Dorsalis pedis pulses are 2+ on the right side, and 2+ on the left side.       Posterior tibial pulses are 2+ on the right side, and 2+ on the left side.  Pulmonary/Chest: Effort normal and breath sounds normal. No respiratory distress.  Abdominal: Soft. Bowel sounds are normal. She exhibits no pulsatile midline mass. There is no tenderness. There is no rigidity, no rebound and no CVA tenderness.  Musculoskeletal:       Back:  Posterior and appearance appears normal. No evidence of obvious scoliosis or kyphosis. No obvious signs of skin changes, trauma, deformity, infection. No C, T, or L spine tenderness or step-offs to palpation. No C, T, or L paraspinal tenderness.  Patient was significant tenderness over gluteus medius, and gluteus maximus.  No bony tenderness of  the right hip.  No tenderness of the right upper leg, right knee, right lower leg.  There is no associated swelling or edema.  Passive range of motion of the right hip, right knee, and ankle intact without limitation.  Lung expansion normal. Bilateral lower extremity strength 5 out of 5 including extensor hallucis longus. Patellar and Achilles deep tendon reflex 2+ and equal bilaterally. Sensation of lower extremities grossly intact. Straight leg right positive. Straight leg left negative. Gait able but patient notes painful. Lower extremity compartments soft. PT and DP 2+ b/l. Cap refill <2 seconds.   Neurological: She is alert.  Normal sensation to lower extremities bilaterally.  No foot drop.  She is able to go from a lying position to a sitting position and stand on her own.  She is able to ambulate unassisted but notes it is painful.  DTR of patellar 2+ bilaterally and equal.  Skin: Skin is warm, dry and intact. Capillary refill takes less than 2 seconds. No rash noted. She is not diaphoretic. No erythema.  No noted joint swelling, erythema, heat.  Nursing note and vitals reviewed.    ED Treatments / Results  Labs (all labs ordered are listed, but only abnormal results are displayed) Labs Reviewed - No data to display  EKG None  Radiology No results found.  Procedures Procedures (including critical care time)  Medications Ordered in ED Medications - No data to display   Initial Impression / Assessment and Plan / ED Course  I have reviewed the triage vital signs and the nursing notes.  Pertinent labs & imaging results that were available during my care of the patient were reviewed by me and considered in my medical decision making (see chart for details).     57 year old female presenting with pain in her right gluteus that radiates down the back of her leg, around her calf and into her foot with associated tingling.  Patient had a fall several weeks ago and was seen here in the  emergency department where she had a negative x-ray of  the lumbar spine, right hip and right knee.  She also underwent a CT scan of the right hip that was negative.  Films have been reviewed by myself personally.  She reports she has continued pain in these areas.  She has not followed up.  Patient has a normal neurologic exam.  No focal deficits.  She denies any bowel/bladder incontinence, urine retention or saddle anesthesia.  Patient can walk but notes it is painful.  No concern for cauda equina.  She denies any fever, IV drug use, spinal injections.  Patient denies any further falls. No personal hx of cancer. She has no bony tenderness.  Her previous x-rays and CT scans are reassuring.  No indication for imaging at this time.  She denies any urinary symptoms.  Abdomen is soft and nontender.  She is neurovascular intact distally.  Compartments are soft. Do not suspect DVT at this time.  I suspect patient's symptoms are radicular nature.  Will plan to treat with prednisone burst.  Patient already has Voltaren, muscle relaxers at home.  I will also add on lidocaine patches.  I have given her referral to orthopedics and recommend she also follow-up with her PCP.  I discussed with her that she may benefit from physical therapy in the future.  Time was given for all questions been answered.  Patient has no other concerns.  Return precautions were discussed.  Patient appears safe for discharge.   Final Clinical Impressions(s) / ED Diagnoses   Final diagnoses:  Right hip pain    ED Discharge Orders         Ordered    predniSONE (DELTASONE) 10 MG tablet  Daily     03/01/18 1530    lidocaine (LIDODERM) 5 %  Every 24 hours     03/01/18 1530           Lorelle Gibbs 03/01/18 1550    Hayden Rasmussen, MD 03/02/18 [phone]

## 2018-03-01 NOTE — ED Triage Notes (Signed)
Pt arrives from home with c/o worsening pain at right hip and right leg with right low back pain. States fall several weeks ago.

## 2018-03-01 NOTE — Discharge Instructions (Addendum)
I reviewed your prior images that were reassuring. As we discussed I believe your symptoms are radicular. Please see attached handouts.   Prednisone - This is an oral steroid.  This medication is best taken with food in the morning.  Please note that this medication can cause anxiety, mood swings, muscle fatigue, increased hunger, weight gain (sodium/fluid retention), poor sleep as well as other symptoms. If you are a diabetic, please monitor your blood sugars at home as this medication can increase your blood sugars.   Continue muscle relaxers as previously prescribed. Muscle relaxants:  These medications can help with muscle tightness that is a cause of lower back pain.  Most of these medications can cause drowsiness, and it is not safe to drive or use dangerous machinery while taking them. They are primarily helpful when taken at night before sleep.  Use lidocaine patches over area of pain. Do not take your medicine if develop an itchy rash, swelling in your mouth or lips, or difficulty breathing.   Please follow up with orthopedics and pcp. You may benefit from physical therapy.   If you develop worsening or new concerning symptoms you can return to the emergency department for re-evaluation.  You lose control of your bowel or bladder (incontinence). Can not feel when you wipe after using the restroom  You fall again.  You have fever You have pain in your abdomen You cannot walk You have swelling of your leg  You have chest pain or shortness of breath You have: Weakness in your lower back, pelvis, buttocks, or legs that gets worse. Redness or swelling of your back. A burning sensation when you urinate.

## 2018-03-02 MED FILL — predniSONE 10 MG TABS: 10 | 5 days supply | Qty: 20 | Fill #0

## 2018-03-03 ENCOUNTER — Other Ambulatory Visit: Payer: Self-pay | Admitting: Physician Assistant

## 2018-03-03 DIAGNOSIS — I10 Essential (primary) hypertension: Secondary | ICD-10-CM

## 2018-03-03 MED FILL — CHLORTHALIDONE 25 MG TAB: 25 | 30 days supply | Qty: 30 | Fill #0

## 2018-03-03 MED FILL — levETIRAcetam 500 MG TABS: 500 | 30 days supply | Qty: 90 | Fill #7

## 2018-03-05 LAB — ARGININE VASOPRESSIN HORMONE
ADH: 1.3 pg/mL (ref 0.0–4.7)
Osmolality Meas: 293 mOsmol/kg (ref 275–295)

## 2018-03-09 ENCOUNTER — Encounter: Payer: Self-pay | Admitting: Family Medicine

## 2018-03-09 ENCOUNTER — Ambulatory Visit: Payer: Medicaid Other | Attending: Family Medicine | Admitting: Family Medicine

## 2018-03-09 VITALS — BP 122/75 | HR 94 | Temp 97.7°F | Ht 59.0 in | Wt 285.4 lb

## 2018-03-09 DIAGNOSIS — I69151 Hemiplegia and hemiparesis following nontraumatic intracerebral hemorrhage affecting right dominant side: Secondary | ICD-10-CM | POA: Diagnosis not present

## 2018-03-09 DIAGNOSIS — Z79899 Other long term (current) drug therapy: Secondary | ICD-10-CM | POA: Diagnosis not present

## 2018-03-09 DIAGNOSIS — Z1211 Encounter for screening for malignant neoplasm of colon: Secondary | ICD-10-CM

## 2018-03-09 DIAGNOSIS — E876 Hypokalemia: Secondary | ICD-10-CM | POA: Insufficient documentation

## 2018-03-09 DIAGNOSIS — E785 Hyperlipidemia, unspecified: Secondary | ICD-10-CM | POA: Diagnosis not present

## 2018-03-09 DIAGNOSIS — Z0000 Encounter for general adult medical examination without abnormal findings: Secondary | ICD-10-CM

## 2018-03-09 DIAGNOSIS — M25511 Pain in right shoulder: Secondary | ICD-10-CM | POA: Diagnosis not present

## 2018-03-09 DIAGNOSIS — E119 Type 2 diabetes mellitus without complications: Secondary | ICD-10-CM | POA: Diagnosis present

## 2018-03-09 DIAGNOSIS — G8929 Other chronic pain: Secondary | ICD-10-CM | POA: Diagnosis not present

## 2018-03-09 DIAGNOSIS — I10 Essential (primary) hypertension: Secondary | ICD-10-CM | POA: Diagnosis not present

## 2018-03-09 DIAGNOSIS — Z7951 Long term (current) use of inhaled steroids: Secondary | ICD-10-CM | POA: Diagnosis not present

## 2018-03-09 DIAGNOSIS — Z6841 Body Mass Index (BMI) 40.0 and over, adult: Secondary | ICD-10-CM

## 2018-03-09 DIAGNOSIS — I611 Nontraumatic intracerebral hemorrhage in hemisphere, cortical: Secondary | ICD-10-CM | POA: Diagnosis not present

## 2018-03-09 DIAGNOSIS — Z7984 Long term (current) use of oral hypoglycemic drugs: Secondary | ICD-10-CM | POA: Diagnosis not present

## 2018-03-09 DIAGNOSIS — E669 Obesity, unspecified: Secondary | ICD-10-CM

## 2018-03-09 LAB — POCT GLYCOSYLATED HEMOGLOBIN (HGB A1C): HBA1C, POC (CONTROLLED DIABETIC RANGE): 6.7 % (ref 0.0–7.0)

## 2018-03-09 LAB — GLUCOSE, POCT (MANUAL RESULT ENTRY): POC Glucose: 306 mg/dl — AB (ref 70–99)

## 2018-03-09 MED ORDER — DICLOFENAC SODIUM 1 % TD GEL: 2.0000 g | Freq: Four times a day (QID) | TRANSDERMAL | 3 refills | Status: DC

## 2018-03-09 MED ORDER — LISINOPRIL 5 MG PO TABS: 5.0000 mg | ORAL_TABLET | Freq: Every day | ORAL | 6 refills | Status: DC

## 2018-03-09 MED ORDER — ATORVASTATIN CALCIUM 20 MG PO TABS: 20.0000 mg | ORAL_TABLET | Freq: Every day | ORAL | 6 refills | Status: DC

## 2018-03-09 MED ORDER — METFORMIN HCL ER 500 MG PO TB24: 1000.0000 mg | ORAL_TABLET | Freq: Two times a day (BID) | ORAL | 6 refills | Status: DC

## 2018-03-09 MED FILL — ATORVASTATIN 20 MG TABLET: 20 | 30 days supply | Qty: 30 | Fill #0

## 2018-03-09 MED FILL — LISINOPRIL 5 MG TAB: 5 | 30 days supply | Qty: 30 | Fill #0

## 2018-03-09 NOTE — Progress Notes (Signed)
Subjective:  Patient ID: [name], female    DOB: 1961/01/21  Age: 57 y.o. 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  She has been compliant with metformin.  Denies numbness in extremities, visual concerns. Recently seen by endocrine for empty sella syndrome found on MRI of the brain, labs were normal except for hypokalemia of 3.2. She currently sees physical medicine and rehab due to her right hemiparesis. Doing well on her antihypertensive. She does have right knee pain which radiates up to her right thigh and increases on rainy days.  She received a cortisone injection in her right knee with improvement in symptoms her pain is described as a 7-8/10 and she currently has Voltaren gel which she uses. She informs me she had a mammogram [facility] in 11/2017 and was informed it was normal.  Past Medical History:  Diagnosis Date  . Headache   . Hypertension   . Iron deficiency anemia   . Seizures ([place])   . Stroke [facility])     Past Surgical History:  Procedure Laterality Date  . CARDIAC CATHETERIZATION  04/2010   Dr. Terrence Dupont    Allergies  Allergen Reactions  . Bee Venom Swelling    Facial swelling  . Eggs Or Egg-Derived Products Nausea And Vomiting and Swelling    Mouth swelled  . Gabapentin     Jittery feeling, dyspnea, crawling sensation  . Gadolinium Derivatives Itching    Immediately after gad injection pt. complained of tongue numbness and lower lip/ then had rt side of body tingling/ pt. Was assessed by Dr. Jeralyn Ruths before leaving/ no medications were administered/     Outpatient  Medications Prior to Visit  Medication Sig Dispense Refill  . acetaminophen (TYLENOL) 500 MG tablet Take 500 mg by mouth every 6 (six) hours as needed for headache (pain).    . cetirizine (ZYRTEC) 10 MG tablet Take 10 mg by mouth daily as needed (allergic reaction).    . diphenhydrAMINE (BENADRYL) 25 MG tablet Take 25 mg by mouth daily as needed for allergies (bee stings).    . DULoxetine (CYMBALTA) 60 MG capsule TAKE 1 CAPSULE DAILY BY MOUTH. 30 capsule 1  . fluticasone (FLONASE) 50 MCG/ACT nasal spray Place 1 spray into both nostrils daily as needed for allergies or rhinitis. 16 g 1  . hydroxypropyl methylcellulose / hypromellose (ISOPTO TEARS / GONIOVISC) 2.5 % ophthalmic solution Place 2 drops into both eyes as needed (irritation). 15 mL 1  . levETIRAcetam (KEPPRA) 500 MG tablet TAKE 1 & 1/2 TABLETS BY MOUTH TWICE DAILY. (Patient taking differently: Take 750 mg by mouth 2 (two) times daily. ) 90 tablet 8  . lidocaine (LIDODERM) 5 % Place 1 patch onto the skin daily. Remove & Discard patch within 12 hours or as directed by MD 30 patch 0  . meclizine (ANTIVERT) 25 MG tablet TAKE 1 TABLET (25 MG TOTAL) BY MOUTH 2 (TWO) TIMES DAILY AS NEEDED FOR DIZZINESS. 60 tablet 1  . methocarbamol (ROBAXIN) 750 MG tablet TAKE 1 TABLET BY MOUTH 2 TIMES DAILY AS NEEDED FOR MUSCLE SPASMS. 60 tablet 1  . metoprolol tartrate (LOPRESSOR) 100  MG tablet Take 1 tablet (100 mg total) by mouth 2 (two) times daily. 60 tablet 3  . Vitamin D, Ergocalciferol, (DRISDOL) 50000 units CAPS capsule Take 1 capsule (50,000 Units total) by mouth every 7 (seven) days. 16 capsule 0  . atorvastatin (LIPITOR) 20 MG tablet Take 1 tablet (20 mg total) by mouth daily. 30 tablet 3  . chlorthalidone (HYGROTON) 25 MG tablet Take 1 tablet (25 mg total) by mouth daily. MUST MAKE APPT FOR FURTHER REFILLS 30 tablet 0  . diclofenac sodium (VOLTAREN) 1 % GEL Apply 2 g topically 4 (four) times daily. 1 Tube 3  . metFORMIN (GLUCOPHAGE XR) 500 MG 24 hr  tablet Take 1 tablet (500 mg total) by mouth 2 (two) times daily. 180 tablet 3  . benzonatate (TESSALON) 100 MG capsule Take 1 capsule (100 mg total) by mouth every 8 (eight) hours. (Patient not taking: Reported on 03/09/2018) 21 capsule 0  . HYDROcodone-acetaminophen (NORCO) 5-325 MG tablet Take 1-2 tablets by mouth every 6 (six) hours as needed for severe pain. (Patient not taking: Reported on 03/09/2018) 10 tablet 0   No facility-administered medications prior to visit.     ROS Review of Systems  Constitutional: Negative for activity change, appetite change and fatigue.  HENT: Negative for congestion, sinus pressure and sore throat.   Eyes: Negative for visual disturbance.  Respiratory: Negative for cough, chest tightness, shortness of breath and wheezing.   Cardiovascular: Negative for chest pain and palpitations.  Gastrointestinal: Negative for abdominal distention, abdominal pain and constipation.  Endocrine: Negative for polydipsia.  Genitourinary: Negative for dysuria and frequency.  Musculoskeletal: Negative for arthralgias and back pain.  Skin: Negative for rash.  Neurological: Positive for weakness. Negative for tremors, light-headedness and numbness.  Hematological: Does not bruise/bleed easily.  Psychiatric/Behavioral: Negative for agitation and behavioral problems.    Objective:  BP 122/75   Pulse 94   Temp 97.7 F (36.5 C) (Oral)   Ht 4\' 11"  (1.499 m)   Wt 285 lb 6.4 oz (129.5 kg)   LMP 02/17/2011   SpO2 97%   BMI 57.64 kg/m   BP/Weight 03/09/2018 03/01/2018 12/23/7508  Systolic BP 258 527 782  Diastolic BP 75 85 82  Wt. (Lbs) 285.4 288 288.2  BMI 57.64 58.17 58.21      Physical Exam Constitutional:      Appearance: She is well-developed.  Cardiovascular:     Rate and Rhythm: Normal rate.     Heart sounds: Normal heart sounds. No murmur.  Pulmonary:     Effort: Pulmonary effort is normal.     Breath sounds: Normal breath sounds. No wheezing or rales.    Chest:     Chest wall: No tenderness.  Abdominal:     General: Bowel sounds are normal. There is no distension.     Palpations: Abdomen is soft. There is no mass.     Tenderness: There is no abdominal tenderness.  Musculoskeletal:     Comments: Reduced strength in RUE and RLE  Neurological:     Mental Status: She is alert and oriented to person, place, and time.  Psychiatric:        Mood and Affect: Mood normal.        Behavior: Behavior normal.     Lab Results  Component Value Date   HGBA1C 6.7 03/09/2018    Assessment & Plan:   1. Type 2 diabetes mellitus without complication, without long-term current use of insulin (HCC) Controlled but A1c 6.7 which has  trended up from 6.5 previously Elevated CBG of 306 due to recent meal Metformin increased to 1000 mg twice daily Continue diabetic diet Diabetic healthcare maintenance at next visit - POCT glucose (manual entry) - POCT glycosylated hemoglobin (Hb A1C) - metFORMIN (GLUCOPHAGE XR) 500 MG 24 hr tablet; Take 2 tablets (1,000 mg total) by mouth 2 (two) times daily.  Dispense: 120 tablet; Refill: 6  2. Nontraumatic cortical hemorrhage of cerebral hemisphere, unspecified laterality ([place]) With slight right-sided weakness Risk factor modifications - atorvastatin (LIPITOR) 20 MG tablet; Take 1 tablet (20 mg total) by mouth daily.  Dispense: 30 tablet; Refill: 6  3. Hyperlipidemia, unspecified hyperlipidemia type Controlled Low-cholesterol diet - atorvastatin (LIPITOR) 20 MG tablet; Take 1 tablet (20 mg total) by mouth daily.  Dispense: 30 tablet; Refill: 6  4. Benign essential HTN Controlled Discontinue chlorthalidone due to hypokalemia and replaced with lisinopril Check potassium in 2 weeks and if low will place on potassium replacement Low-sodium diet - Basic Metabolic Panel; Future  5. Chronic right shoulder pain Stable - diclofenac sodium (VOLTAREN) 1 % GEL; Apply 2 g topically 4 (four) times daily.  Dispense: 1  Tube; Refill: 3  6. Screening for colon cancer - Ambulatory referral to Gastroenterology  7. Health care maintenance She would need an adjustable table for her Pap smear - Ambulatory referral to Gynecology   Meds ordered this encounter  Medications  . metFORMIN (GLUCOPHAGE XR) 500 MG 24 hr tablet    Sig: Take 2 tablets (1,000 mg total) by mouth 2 (two) times daily.    Dispense:  120 tablet    Refill:  6    Discontinue previous odse  . atorvastatin (LIPITOR) 20 MG tablet    Sig: Take 1 tablet (20 mg total) by mouth daily.    Dispense:  30 tablet    Refill:  6  . lisinopril (PRINIVIL,ZESTRIL) 5 MG tablet    Sig: Take 1 tablet (5 mg total) by mouth daily.    Dispense:  30 tablet    Refill:  6    Discontinue Chlorthalidone  . diclofenac sodium (VOLTAREN) 1 % GEL    Sig: Apply 2 g topically 4 (four) times daily.    Dispense:  1 Tube    Refill:  3    Follow-up: Return in about 3 months (around 06/08/2018) for Follow-up of chronic medical conditions.   Charlott Rakes MD

## 2018-03-09 NOTE — Progress Notes (Signed)
[id number]

## 2018-03-23 ENCOUNTER — Ambulatory Visit: Payer: Medicaid Other | Attending: Family Medicine

## 2018-03-23 DIAGNOSIS — I10 Essential (primary) hypertension: Secondary | ICD-10-CM

## 2018-03-24 LAB — BASIC METABOLIC PANEL
BUN/Creatinine Ratio: 13 (ref 9–23)
BUN: 13 mg/dL (ref 6–24)
CO2: 23 mmol/L (ref 20–29)
Calcium: 9.3 mg/dL (ref 8.7–10.2)
Chloride: 104 mmol/L (ref 96–106)
Creatinine, Ser: 1.02 mg/dL — ABNORMAL HIGH (ref 0.57–1.00)
GFR calc Af Amer: 71 mL/min/{1.73_m2} (ref >59)
GFR calc non Af Amer: 61 mL/min/{1.73_m2} (ref >59)
Glucose: 100 mg/dL — ABNORMAL HIGH (ref 65–99)
Potassium: 4.1 mmol/L (ref 3.5–5.2)
Sodium: 145 mmol/L — ABNORMAL HIGH (ref 134–144)

## 2018-03-26 ENCOUNTER — Telehealth: Payer: Self-pay

## 2018-03-26 NOTE — Telephone Encounter (Signed)
Patient was called and informed of lab results. 

## 2018-03-26 NOTE — Telephone Encounter (Signed)
-----   Message from Charlott Rakes, MD sent at 03/26/2018  9:51 AM EST ----- Labs are stable, potassium is normal

## 2018-03-27 ENCOUNTER — Encounter: Payer: Self-pay | Admitting: Gastroenterology

## 2018-03-30 ENCOUNTER — Other Ambulatory Visit: Payer: Self-pay | Admitting: Physician Assistant

## 2018-03-30 DIAGNOSIS — I10 Essential (primary) hypertension: Secondary | ICD-10-CM

## 2018-03-31 MED FILL — METOPROLOL TARTRATE 100 MG: 100 | 30 days supply | Qty: 60 | Fill #0

## 2018-04-01 ENCOUNTER — Telehealth: Payer: Self-pay

## 2018-04-01 NOTE — Telephone Encounter (Signed)
Please schedule next available office appointment with either myself or extender

## 2018-04-01 NOTE — Telephone Encounter (Signed)
Dr Silverio Decamp, This pt is scheduled for her colon on 04/28/18. After reviewing chart, her BMI is 57.61.  Does pt need OV or is a direct hospital colon ok? Please advise. Thanks, [name] in [facility]

## 2018-04-03 NOTE — Telephone Encounter (Signed)
Called patient, no answer. Left message for patient to return my call today to make an office visit instead of PV/colon. Colonoscopy cancelled at this time.

## 2018-04-06 ENCOUNTER — Encounter: Payer: Self-pay | Admitting: *Deleted

## 2018-04-06 NOTE — Telephone Encounter (Signed)
Spoke with patient. Notified her that Dr.Nandigam wants to see her in the office before colonoscopy. Notified pt colon and PV cancelled. Made office visit for 2/10 at 2:15 pm, arrive at 2 pm. Pt verbalizes understanding.

## 2018-04-07 MED FILL — LISINOPRIL 5 MG TAB: 5 | 30 days supply | Qty: 30 | Fill #1

## 2018-04-07 MED FILL — DULoxetine HCL 60 MG CPEP: 60 | 30 days supply | Qty: 30 | Fill #1

## 2018-04-17 ENCOUNTER — Other Ambulatory Visit: Payer: Self-pay | Admitting: Nurse Practitioner

## 2018-04-17 DIAGNOSIS — I611 Nontraumatic intracerebral hemorrhage in hemisphere, cortical: Secondary | ICD-10-CM

## 2018-04-17 MED FILL — levETIRAcetam 500 MG TABS: 500 | 30 days supply | Qty: 90 | Fill #0

## 2018-04-17 MED FILL — ATORVASTATIN 20 MG TABLET: 20 | 30 days supply | Qty: 30 | Fill #1

## 2018-04-17 NOTE — Progress Notes (Addendum)
  [name]  PATIENT: [name] DOB: 12/22/1960   REASON FOR VISIT: follow up for stroke, OSA HISTORY FROM:patient    HISTORY OF PRESENT ILLNESS:[name] is a 58 y.o. female with history of morbid obesity and hypertension admitted on 10/06/15 for LOC and GTC seizure. CT showed right temporal small ICH, and repeat CT stable hematoma. CTA and CTV head without AVM or CVT. CUS, DVT screening and TTE unremarkable. LTM  EEG no seizure. LD 58 and A1C 5.7. She was put on keppra 750mg bid. She was discharged to CIR with planning outpt sleep study.   11/29/15 follow up Dr. Xu- the patient has been doing well. No seizure activity. She is not driving so far. However, she complains of right shoulder pain with limited ROM at right UE. Walks with cane at home. BP today 137/78 and also stable at home. BP controlled well with 2 BP meds.  Interval HistoryXU During the interval time, pt has been doing well. Had MRI and MRA in 11/2015 showed resolved bleeding and no AVM or aneurysm. He followed with Dr. Patel for right shoulder pain and was put on Voltaren gel which helped. She continued on keppra and no more seizures. She complains of turning head back from right lateral position fast causing dizzy, but it turning slow then no dizziness. Currently sleep study on hold due to no insurance. Still has outpt PT. BP today 132/78. UPDATE 07/11/2018CM [name], 55-year-old female returns for follow-upwith a history of right temporal small ICH. Repeat CT stable and restarted on aspirin at her last visit.she has not had further stroke or TIA symptoms. She has minimal bruising or bleeding. She remains on Keppra for seizure disorder with no further seizure activity. She is back to driving without difficulty. She remains on Lipitor without complaints of myalgias. She is morbidly obese and needs a sleep study however insurance will not pay for this.She continues to see Dr. Patel for her right  shoulder pain. She states that she is doing her home exercise program She returns for reevaluation without further neurologic symptoms UPDATE 1/9/2019CM [name], 56-year-old female returns for follow-up degree of right temporal small ICH.  She is back on aspirin without further stroke or TIA symptoms.  No bleeding and no bruising.  She is also on Keppra for seizure disorder without side effects or recurrent seizures.  She remains on Lipitor stroke prevention without myalgias.  She is morbidly obese.  She now has Medicaid and can get her sleep study.  Has a lot of daytime drowsiness.She complains of intermittent headache.  She takes Tylenol.  She continues to see Dr. Patel for right shoulder pain.  She uses Voltaren gel and Robaxin with fairly good pain relief.  She ambulates with a single-point cane she returns for reevaluation   Today, 10/08/2017:SA I reviewed her CPAP compliance data from 09/07/2017 through 10/06/2017 which is a total of 30 days, during which time she used her CPAP every night with percent used days greater than 4 hours at 83%, indicating very good compliance with an average usage of 5 hours and 42 minutes, residual AHI suboptimal at 10.4 per hour, leak on the high side with the 95th percentile at 28.1 L/m on a pressure of 12 cm with EPR of 3. She reports she has had some recurrent headaches. She tried to get a different mask today but the respiratory therapist was not at the DME office today. She reports that she has noted some   benefit including better sleep consolidation, better sleep quality, less nocturia but she is using a fullface mask and has noticed a leak which blows cold air into her face and she wakes up feeling very cold. She also tries to adjust the head gear and has had some pressure headaches from adjusting it too tightly. She now has a mask fit appointment on Monday. She is motivated to continue with treatment and also interested in working harder on weight loss. She has met  with a nutritionist in the past. UPDATE 1/27/2020CM [name], 57-year-old female returns for follow-up with history of stroke right temporal small ICH.  She is back on aspirin without further stroke or TIA symptoms no bleeding and no bruising.  She is also on Keppra for seizure disorder without any recurrent seizures.  She remains on Lipitor without myalgias she is morbidly obese sleep study was positive for obstructive sleep apnea.  She continues to see Dr. Patel for right shoulder pain and mild right hemiparesis.  She had an ER admission back in October for acute maxillary sinusitis she was placed on antibiotics and was told not to restart her CPAP until her sinus infection cleared she never has restarted her CPAP when last seen by Dr. Athar in July she was 83% compliant at that time.  Compliance data dated 12/25/2017 to 01/23/2018 shows compliance greater than 4 hours at 60% less than 4 hours at 20% for time compliance of 80%.  Average usage 4 hours 47 minutes set pressure 12 cm AHI 3 ESS 11.  She stopped her CPAP shortly after this reading due to her sinus infection and never restarted.  She returns for reevaluation   REVIEW OF SYSTEMS: Full 14 system review of systems performed and notable only for those listed, all others are neg:  Constitutional: Morbidly obese Cardiovascular: leg swelling Ear/Nose/Throat: neg  Skin: neg Eyes: blurred vision Respiratory: Wheezing Gastroitestinal: neg  Hematology/Lymphatic: neg  Endocrine: neg Musculoskeletal: Shoulder pain, walking difficulty Allergy/Immunology: Food allergies Neurological: occasional headache Psychiatric: neg Sleep : Daytime sleepiness obstructive sleep apnea with CPAP   ALLERGIES: Allergies  Allergen Reactions  . Bee Venom Swelling    Facial swelling  . Eggs Or Egg-Derived Products Nausea And Vomiting and Swelling    Mouth swelled  . Gabapentin     Jittery feeling, dyspnea, crawling sensation  . Gadolinium Derivatives Itching      Immediately after gad injection pt. complained of tongue numbness and lower lip/ then had rt side of body tingling/ pt. Was assessed by Dr. Grady before leaving/ no medications were administered/    HOME MEDICATIONS: Outpatient Medications Prior to Visit  Medication Sig Dispense Refill  . acetaminophen (TYLENOL) 500 MG tablet Take 500 mg by mouth every 6 (six) hours as needed for headache (pain).    . atorvastatin (LIPITOR) 20 MG tablet Take 1 tablet (20 mg total) by mouth daily. 30 tablet 6  . benzonatate (TESSALON) 100 MG capsule Take 1 capsule (100 mg total) by mouth every 8 (eight) hours. 21 capsule 0  . cetirizine (ZYRTEC) 10 MG tablet Take 10 mg by mouth daily as needed (allergic reaction).    . diclofenac sodium (VOLTAREN) 1 % GEL Apply 2 g topically 4 (four) times daily. 1 Tube 3  . diphenhydrAMINE (BENADRYL) 25 MG tablet Take 25 mg by mouth daily as needed for allergies (bee stings).    . DULoxetine (CYMBALTA) 60 MG capsule TAKE 1 CAPSULE DAILY BY MOUTH. 30 capsule 1  . fluticasone (FLONASE) 50   MCG/ACT nasal spray Place 1 spray into both nostrils daily as needed for allergies or rhinitis. 16 g 1  . HYDROcodone-acetaminophen (NORCO) 5-325 MG tablet Take 1-2 tablets by mouth every 6 (six) hours as needed for severe pain. 10 tablet 0  . hydroxypropyl methylcellulose / hypromellose (ISOPTO TEARS / GONIOVISC) 2.5 % ophthalmic solution Place 2 drops into both eyes as needed (irritation). 15 mL 1  . levETIRAcetam (KEPPRA) 500 MG tablet TAKE 1 & 1/2 TABLETS BY MOUTH TWICE DAILY. 90 tablet 8  . lidocaine (LIDODERM) 5 % Place 1 patch onto the skin daily. Remove & Discard patch within 12 hours or as directed by MD 30 patch 0  . lisinopril (PRINIVIL,ZESTRIL) 5 MG tablet Take 1 tablet (5 mg total) by mouth daily. 30 tablet 6  . meclizine (ANTIVERT) 25 MG tablet TAKE 1 TABLET (25 MG TOTAL) BY MOUTH 2 (TWO) TIMES DAILY AS NEEDED FOR DIZZINESS. 60 tablet 1  . metFORMIN (GLUCOPHAGE XR) 500 MG 24 hr  tablet Take 2 tablets (1,000 mg total) by mouth 2 (two) times daily. 120 tablet 6  . methocarbamol (ROBAXIN) 750 MG tablet TAKE 1 TABLET BY MOUTH 2 TIMES DAILY AS NEEDED FOR MUSCLE SPASMS. 60 tablet 1  . metoprolol tartrate (LOPRESSOR) 100 MG tablet TAKE 1 TABLET BY MOUTH 2 TIMES DAILY. 60 tablet 2  . Vitamin D, Ergocalciferol, (DRISDOL) 50000 units CAPS capsule Take 1 capsule (50,000 Units total) by mouth every 7 (seven) days. 16 capsule 0   No facility-administered medications prior to visit.     PAST MEDICAL HISTORY: Past Medical History:  Diagnosis Date  . Headache   . Hypertension   . Iron deficiency anemia   . Seizures ([place])   . Stroke ([place])   . Type 2 diabetes mellitus ([place])     PAST SURGICAL HISTORY: Past Surgical History:  Procedure Laterality Date  . CARDIAC CATHETERIZATION  04/2010   Dr. Terrence Dupont    FAMILY HISTORY: Family History  Problem Relation Age of Onset  . Diabetes Father   . Hypertension Father   . Hypertension Sister   . Stroke Brother     SOCIAL HISTORY: Social History   Socioeconomic History  . Marital status: Married    Spouse name: Not on file  . Number of children: Not on file  . Years of education: Not on file  . Highest education level: Not on file  Occupational History  . Not on file  Social Needs  . Financial resource strain: Not on file  . Food insecurity:    Worry: Not on file    Inability: Not on file  . Transportation needs:    Medical: Not on file    Non-medical: Not on file  Tobacco Use  . Smoking status: Never Smoker  . Smokeless tobacco: Never Used  Substance and Sexual Activity  . Alcohol use: No  . Drug use: No  . Sexual activity: Not Currently  Lifestyle  . Physical activity:    Days per week: Not on file    Minutes per session: Not on file  . Stress: Not on file  Relationships  . Social connections:    Talks on phone: Not on file    Gets together: Not on file    Attends religious service: Not on file     Active member of club or organization: Not on file    Attends meetings of clubs or organizations: Not on file    Relationship status: Not on file  . Intimate  partner violence:    Fear of current or ex partner: Not on file    Emotionally abused: Not on file    Physically abused: Not on file    Forced sexual activity: Not on file  Other Topics Concern  . Not on file  Social History Narrative  . Not on file     PHYSICAL EXAM  Vitals:   04/20/18 1447  BP: (!) 158/91  Pulse: 96  Weight: 290 lb 12.8 oz (131.9 kg)  Height: 4' 11" (1.499 m)   Body mass index is 58.73 kg/m.  Generalized: Well developed, morbidly obese femalein no acute distress  Head: normocephalic and atraumatic,. Oropharynx benign  Neck: Supple, no carotid bruits  Cardiac: Regular rate rhythm, no murmur  Musculoskeletal: No deformity   Neurological examination   Mentation: Alert oriented to time, place, history taking. Attention span and concentration appropriate. Recent and remote memory intact.  Follows all commands speech and language fluent.   Cranial nerve II-XII: Pupils were equal round reactive to light extraocular movements were full, visual field were full on confrontational test. Facial sensation and strength were normal. hearing was intact to finger rubbing bilaterally. Uvula tongue midline. head turning and shoulder shrug were normal and symmetric.Tongue protrusion into cheek strength was normal. Motor: normal bulk and tone, full strength in the BUE, BLE, except RUE and RLE 4/5 Sensory: normal and symmetric to light touch,  Coordination: finger-nose-finger, heel-to-shin bilaterally, no dysmetria Reflexes: 1+ upper lower and symmetric, plantar responses were flexor bilaterally. Gait and Station: Rising up from seated position without assistance,slow steady broad-based gait with cane DIAGNOSTIC DATA (LABS, IMAGING, TESTING) - I reviewed patient records, labs, notes, testing and imaging myself where  available.  Lab Results  Component Value Date   WBC 8.2 04/21/2017   HGB 12.0 04/21/2017   HCT 39.0 04/21/2017   MCV 81.6 04/21/2017   PLT 341 04/21/2017      Component Value Date/Time   NA 145 (H) 03/23/2018 1622   K 4.1 03/23/2018 1622   CL 104 03/23/2018 1622   CO2 23 03/23/2018 1622   GLUCOSE 100 (H) 03/23/2018 1622   GLUCOSE 134 (H) 02/24/2018 1457   BUN 13 03/23/2018 1622   CREATININE 1.02 (H) 03/23/2018 1622   CREATININE 0.86 02/20/2016 0953   CALCIUM 9.3 03/23/2018 1622   PROT 7.7 10/15/2017 1100   ALBUMIN 3.6 10/15/2017 1100   AST 14 10/15/2017 1100   ALT 12 10/15/2017 1100   ALKPHOS 73 10/15/2017 1100   BILITOT 0.3 10/15/2017 1100   GFRNONAA 61 03/23/2018 1622   GFRNONAA 76 02/20/2016 0953   GFRAA 71 03/23/2018 1622   GFRAA 88 02/20/2016 0953   Lab Results  Component Value Date   CHOL 74 (L) 10/15/2017   HDL 33 (L) 10/15/2017   LDLCALC 24 10/15/2017   TRIG 86 10/15/2017   CHOLHDL 2.2 10/15/2017   Lab Results  Component Value Date   HGBA1C 6.7 03/09/2018      ASSESSMENT AND PLAN 57 y.o. African American female with PMH of morbid obesity and hypertension admitted on 10/06/15 for LOC and GTC seizure. CT showed right temporal small ICH, and repeat CT stable hematoma. CTA and CTV head without AVM or CVT. CUS, DVT screening and TTE unremarkable. LTM  EEG no seizure. LD 58 and A1C 5.7. She was put on keppra 750mg bid. She was discharged to CIR with planning outpt sleep study. During the interval time, the patient has no seizure activity. Complains of right shoulder pain   with limited ROM at right UE.  Repeat MRI and MRA showed resolved bleeding and no underlying vascular abnormalities. Now on  baby ASA. Sleep study positive for obstructive sleep apnea  Continue follow up with Dr. Patel for shoulder pain.   Patient has not been compliant with her CPAP since ER admission for sinusitis in October The patient is a  patient of Dr. Xu who has left the practice.      Continue ASA 81mg daily for stroke prevention Continue follow up with Dr. Petal for rehab and shoulder pain and right lower extremity weakness  Follow up with your primary care physician for stroke risk factor modification. Recommend maintain blood pressure goal <130/80, diabetes with hemoglobin A1c goal below 7.0% and lipids with LDL cholesterol goal below 70 mg/dL. Continue Lipitor for hyperlipidemia and stroke prevention Continue home exercise program  Continue the keppra for seizure control Call for seizure activity -Get back on CPA{P for OSA -follow up in 6 months I spent 25 minutes in total face to face time with the patient more than 50% of which was spent counseling and coordination of care, reviewing test results reviewing medications and discussing and reviewing the diagnosis of stroke and management of risk factors along with seizure disorder and common seizure triggers.  Also discussed obstructive sleep apnea and importance of compliance with her CPAP .    Carolyn , GNP, BC, APRN  [name] [address], [address] [place], [place] [address] [phone]  I reviewed the above note and documentation by the Nurse Practitioner and agree with the history, physical exam, assessment and plan as outlined above. I was immediately available for face-to-face consultation. Saima Athar, MD, PhD [name] ([facility])   

## 2018-04-20 ENCOUNTER — Encounter: Payer: Self-pay | Admitting: Nurse Practitioner

## 2018-04-20 ENCOUNTER — Ambulatory Visit: Payer: Medicaid Other | Admitting: Nurse Practitioner

## 2018-04-20 VITALS — BP 158/91 | HR 96 | Ht 59.0 in | Wt 290.8 lb

## 2018-04-20 DIAGNOSIS — E669 Obesity, unspecified: Secondary | ICD-10-CM | POA: Diagnosis not present

## 2018-04-20 DIAGNOSIS — G4733 Obstructive sleep apnea (adult) (pediatric): Secondary | ICD-10-CM | POA: Diagnosis not present

## 2018-04-20 DIAGNOSIS — G40909 Epilepsy, unspecified, not intractable, without status epilepticus: Secondary | ICD-10-CM | POA: Diagnosis not present

## 2018-04-20 DIAGNOSIS — Z8673 Personal history of transient ischemic attack (TIA), and cerebral infarction without residual deficits: Secondary | ICD-10-CM | POA: Diagnosis not present

## 2018-04-20 DIAGNOSIS — R269 Unspecified abnormalities of gait and mobility: Secondary | ICD-10-CM

## 2018-04-20 DIAGNOSIS — I69398 Other sequelae of cerebral infarction: Secondary | ICD-10-CM

## 2018-04-20 NOTE — Patient Instructions (Addendum)
Continue ASA 81mg  daily for stroke prevention Continue follow up with Dr. Lottie Rater for rehab and shoulder pain  Follow up with your primary care physician for stroke risk factor modification. Recommend maintain blood pressure goal <130/80, diabetes with hemoglobin A1c goal below 7.0% and lipids with LDL cholesterol goal below 70 mg/dL. Continue Lipitor for hyperlipidemia and stroke prevention Continue home exercise program  Continue the keppra for seizure control Call for seizure activity -Get back on CPAP for OSA -follow up in 6 months

## 2018-04-21 NOTE — Progress Notes (Signed)
I agree with the above plan 

## 2018-04-22 ENCOUNTER — Other Ambulatory Visit (HOSPITAL_COMMUNITY)
Admission: RE | Admit: 2018-04-22 | Discharge: 2018-04-22 | Disposition: A | Discharge status: Home / Self Care | Payer: Medicaid Other | Source: Ambulatory Visit | Attending: Obstetrics & Gynecology | Admitting: Obstetrics & Gynecology

## 2018-04-22 ENCOUNTER — Ambulatory Visit (INDEPENDENT_AMBULATORY_CARE_PROVIDER_SITE_OTHER): Payer: Self-pay | Admitting: Clinical

## 2018-04-22 ENCOUNTER — Encounter: Payer: Self-pay | Admitting: Obstetrics & Gynecology

## 2018-04-22 ENCOUNTER — Ambulatory Visit (INDEPENDENT_AMBULATORY_CARE_PROVIDER_SITE_OTHER): Payer: Medicaid Other | Admitting: Obstetrics & Gynecology

## 2018-04-22 VITALS — BP 120/74 | HR 80 | Ht 59.0 in | Wt 289.0 lb

## 2018-04-22 DIAGNOSIS — F4321 Adjustment disorder with depressed mood: Secondary | ICD-10-CM

## 2018-04-22 DIAGNOSIS — Z0000 Encounter for general adult medical examination without abnormal findings: Secondary | ICD-10-CM

## 2018-04-22 DIAGNOSIS — Z1239 Encounter for other screening for malignant neoplasm of breast: Secondary | ICD-10-CM

## 2018-04-22 DIAGNOSIS — Z01419 Encounter for gynecological examination (general) (routine) without abnormal findings: Secondary | ICD-10-CM

## 2018-04-22 NOTE — Progress Notes (Signed)
Subjective:     [name] is a 58 y.o. female here for a routine exam. G2P2 LMP 6 years prev. Current complaints: no. Pt reports 'old lady leakage of urine with sneeze laugh our cough, she reports that she is used ot it. Does not wear a pad. Pt reports that she is sexually active with a female. Pt denies problems with dryness.      Gynecologic History Patient's last menstrual period was 02/17/2011 (exact date). Contraception: post menopausal status Last Pap: unsure >20 years prev Last mammogram: Oct 2019 via mobile mammography. No results in [place].   Obstetric History G2P2 pt reports c/s x2.   Review of Systems Pertinent items are noted in HPI.    Objective:  BP 120/74   Pulse 80   Ht 4\' 11"  (1.499 m)   Wt 289 lb (131.1 kg)   LMP 02/17/2011 (Exact Date)   BMI 58.37 kg/m   General Appearance:    Alert, cooperative, no distress, appears stated age  Head:    Normocephalic, without obvious abnormality, atraumatic  Eyes:    conjunctiva/corneas clear, EOM's intact, both eyes  Ears:    Normal external ear canals, both ears  Nose:   Nares normal, septum midline, mucosa normal, no drainage    or sinus tenderness  Throat:   Lips, mucosa, and tongue normal; teeth and gums normal  Neck:   Supple, symmetrical, trachea midline, no adenopathy;    thyroid:  no enlargement/tenderness/nodules  Back:     Symmetric, no curvature, ROM normal, no CVA tenderness  Lungs:     Clear to auscultation bilaterally, respirations unlabored  Chest Wall:    No tenderness or deformity   Heart:    Regular rate and rhythm, S1 and S2 normal, no murmur, rub   or gallop  Breast Exam:    No tenderness, masses, or nipple abnormality  Abdomen:     Soft, non-tender, bowel sounds active all four quadrants,    no masses, no organomegaly  Genitalia:    Normal female without lesion, discharge or tenderness     Extremities:   Extremities normal, atraumatic, no cyanosis or edema  Pulses:   2+ and symmetric all  extremities  Skin:   Skin color, texture, turgor normal, no rashes or lesions     Assessment:    Healthy female exam.   Pos PHQ   Plan:    Follow up in: 1 year.    F/u in PAP Yearly mammogram. (had last one on mobile) Referral to behavioral health   Dream Harman L. Harraway-Smith, M.D., [name]

## 2018-04-22 NOTE — BH Specialist Note (Signed)
Integrated Behavioral Health Initial Visit  MRN: [id number] Name: [name]  Number of [place] Clinician visits:: 1/6 Session Start time: 10:20   Session End time: 11:39 Total time: 1 hour  Type of Service: [place] Interpretor:No. Interpretor Name and Language: n/a   Warm Hand Off Completed.       SUBJECTIVE: [name] is a 58 y.o. female accompanied by n/a Patient was referred by Lavonia Drafts, MD for symptoms of depression Patient reports the following symptoms/concerns: Pt states her primary concern today is feeling depressed and chronic pain after having a stroke, the loss of 3 family members in the past 2.5 years, and other life stress. Prior to the stroke, pt was active working and attending church services over 3 times/week; disability and pain have prevented her from resuming activities.  Duration of problem: Over two years; Severity of problem: moderate  OBJECTIVE: Mood: Depressed and Affect: Appropriate Risk of harm to self or others: No plan to harm self or others  LIFE CONTEXT: Family and Social: Pt lives with her husband and brother (pt is caretaker of brother who has seizure disorder). Pt has 2 adult daughters, and 6 grandchildren.  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 Reduce symptoms of: depression and stress 2. Increase knowledge and/or ability of: healthy habits and stress reduction  3. Demonstrate ability to: Increase healthy adjustment to current life circumstances, Increase adequate support systems for patient/family and Increase motivation to adhere to plan of care  INTERVENTIONS: Interventions utilized: Mindfulness or Psychologist, educational, Psychoeducation and/or Health Education  and Link to [employer]  Standardized Assessments completed: GAD-7 and PHQ 9  ASSESSMENT: Patient currently experiencing Adjustment disorder with depressed mood..   Patient may benefit from psychoeducation and brief therapeutic interventions regarding coping with symptoms of depression, stress, and chronic pain .  PLAN: 1. Follow up with behavioral health clinician on : As needed 2. Behavioral recommendations:  -Attend church service tonight; plan to begin attending again at least weekly -Plan to attend at least one free chair yoga class at [facility] as soon as able; consider attending weekly -Continue prayer time daily -Read educational materials regarding coping with symptoms of depression  3. Referral(s): [place] [facility] and [employer] Resources:  [facility] 4. "From scale of 1-10, how likely are you to follow plan?": 8  Garlan Fair, LCSW  Depression screen [facility] 2/9 04/22/2018 04/22/2018 10/31/2017 10/15/2017 08/29/2017  Decreased Interest 2 2 1 2  0  Down, Depressed, Hopeless 2 2 1 1  0  PHQ - 2 Score 4 4 2 3  0  Altered sleeping 2 2 - 2 -  Tired, decreased energy 2 2 - 1 -  Change in appetite 2 2 - 1 -  Feeling bad or failure about yourself  2 2 - 1 -  Trouble concentrating 0 0 - 0 -  Moving slowly or fidgety/restless 2 2 - 1 -  Suicidal thoughts 0 0 - 0 -  PHQ-9 Score 14 14 - 9 -  Difficult doing work/chores - - - - -  Some recent data might be hidden   GAD 7 : Generalized Anxiety Score 04/22/2018 10/15/2017 07/16/2017 05/23/2017  Nervous, Anxious, on Edge 0 0 0 (No Data)  Control/stop worrying 0 2 1 1   Worry too much - different  things 0 1 1 1   Trouble relaxing 0 0 0 1  Restless 0 0 0 1  Easily annoyed or irritable 2 1 1 2   Afraid - awful might happen 1 0 0 (No Data)  Total GAD 7 Score 3 4 3  -

## 2018-04-24 LAB — CYTOLOGY - PAP
Diagnosis: NEGATIVE
HPV: NOT DETECTED

## 2018-04-28 ENCOUNTER — Encounter: Payer: Medicaid Other | Admitting: Gastroenterology

## 2018-05-04 ENCOUNTER — Encounter: Payer: Self-pay | Admitting: Gastroenterology

## 2018-05-04 ENCOUNTER — Ambulatory Visit: Payer: Medicaid Other | Admitting: Gastroenterology

## 2018-05-04 VITALS — BP 140/88 | HR 83 | Ht <= 58 in | Wt 295.0 lb

## 2018-05-04 DIAGNOSIS — Z1211 Encounter for screening for malignant neoplasm of colon: Secondary | ICD-10-CM

## 2018-05-04 MED ORDER — NA SULFATE-K SULFATE-MG SULF 17.5-3.13-1.6 GM/177ML PO SOLN: ORAL | 0 refills | Status: DC

## 2018-05-04 MED FILL — SUPREP BOWEL PREP KIT: 17.5-3.13-1 | 1 days supply | Qty: 354 | Fill #0

## 2018-05-04 MED FILL — METOPROLOL TARTRATE 100 MG: 100 | 30 days supply | Qty: 60 | Fill #1

## 2018-05-04 NOTE — Patient Instructions (Signed)
You have been scheduled for a hospital colonoscopy on 06/16/2018 at 11:15am, separate instructions have been given  No Oral diabetic medications the morning of your procedure  If you are age 58 or older, your body mass index should be between 23-30. Your Body mass index is 61.66 kg/m. If this is out of the aforementioned range listed, please consider follow up with your Primary Care Provider.  If you are age 73 or younger, your body mass index should be between 19-25. Your Body mass index is 61.66 kg/m. If this is out of the aformentioned range listed, please consider follow up with your Primary Care Provider.    I appreciate the  opportunity to care for you  Thank You   Harl Bowie , MD

## 2018-05-04 NOTE — Progress Notes (Signed)
[name]    [id number]    09/17/1960  Primary Care Physician:Newlin, Charlane Ferretti, MD  Referring Physician: Charlott Rakes, MD [place], [place] [address]  Chief complaint:  Colorectal cancer screening, rectal bleeding  HPI: 58 year old female with morbid obesity here to discuss colorectal cancer screening.  She has rectal bleeding occasionally when ever she has diarrhea. She drank cleansing tea around christmas time for a week and had diarrhea and rectal bleeding at the time.  Occasional bright red blood per rectum when she wipes especially if she takes any laxatives.  Denies any decreased appetite or weight loss.  No significant change in bowel habits.  No family history of colon cancer.    Outpatient Encounter Medications as of 05/04/2018  Medication Sig  . acetaminophen (TYLENOL) 500 MG tablet Take 500 mg by mouth every 6 (six) hours as needed for headache (pain).  Marland Kitchen atorvastatin (LIPITOR) 20 MG tablet Take 1 tablet (20 mg total) by mouth daily.  . cetirizine (ZYRTEC) 10 MG tablet Take 10 mg by mouth daily as needed (allergic reaction).  Marland Kitchen diclofenac sodium (VOLTAREN) 1 % GEL Apply 2 g topically 4 (four) times daily.  . diphenhydrAMINE (BENADRYL) 25 MG tablet Take 25 mg by mouth daily as needed for allergies (bee stings).  . DULoxetine (CYMBALTA) 60 MG capsule TAKE 1 CAPSULE DAILY BY MOUTH.  . fluticasone (FLONASE) 50 MCG/ACT nasal spray Place 1 spray into both nostrils daily as needed for allergies or rhinitis.  . hydroxypropyl methylcellulose / hypromellose (ISOPTO TEARS / GONIOVISC) 2.5 % ophthalmic solution Place 2 drops into both eyes as needed (irritation).  Marland Kitchen levETIRAcetam (KEPPRA) 500 MG tablet TAKE 1 & 1/2 TABLETS BY MOUTH TWICE DAILY.  Marland Kitchen lidocaine (LIDODERM) 5 % Place 1 patch onto the skin daily. Remove & Discard patch within 12 hours or as directed by MD  . lisinopril (PRINIVIL,ZESTRIL) 5 MG tablet Take 1 tablet (5 mg total) by mouth  daily.  . meclizine (ANTIVERT) 25 MG tablet TAKE 1 TABLET (25 MG TOTAL) BY MOUTH 2 (TWO) TIMES DAILY AS NEEDED FOR DIZZINESS.  . metFORMIN (GLUCOPHAGE XR) 500 MG 24 hr tablet Take 2 tablets (1,000 mg total) by mouth 2 (two) times daily.  . methocarbamol (ROBAXIN) 750 MG tablet TAKE 1 TABLET BY MOUTH 2 TIMES DAILY AS NEEDED FOR MUSCLE SPASMS.  . metoprolol tartrate (LOPRESSOR) 100 MG tablet TAKE 1 TABLET BY MOUTH 2 TIMES DAILY.   No facility-administered encounter medications on file as of 05/04/2018.     Allergies as of 05/04/2018 - Review Complete 05/04/2018  Allergen Reaction Noted  . Bee venom Swelling 04/21/2017  . Eggs or egg-derived products Nausea And Vomiting and Swelling 11/29/2015  . Gabapentin  09/10/2016  . Gadolinium derivatives Itching 12/12/2015    Past Medical History:  Diagnosis Date  . Headache   . Hypertension   . Iron deficiency anemia   . Seizures ([place])   . Stroke ([place])   . Type 2 diabetes mellitus ([place])     Past Surgical History:  Procedure Laterality Date  . CARDIAC CATHETERIZATION  04/2010   Dr. Terrence Dupont    Family History  Problem Relation Age of Onset  . Diabetes Father   . Hypertension Father   . Hypertension Sister   . Stroke Brother     Social History   Socioeconomic History  . Marital status: Married    Spouse name: Not on file  . Number of  children: Not on file  . Years of education: Not on file  . Highest education level: Not on file  Occupational History  . Not on file  Social Needs  . Financial resource strain: Not on file  . Food insecurity:    Worry: Not on file    Inability: Not on file  . Transportation needs:    Medical: Not on file    Non-medical: Not on file  Tobacco Use  . Smoking status: Never Smoker  . Smokeless tobacco: Never Used  Substance and Sexual Activity  . Alcohol use: No  . Drug use: No  . Sexual activity: Not Currently  Lifestyle  . Physical activity:    Days per week: Not on file    Minutes per  session: Not on file  . Stress: Not on file  Relationships  . Social connections:    Talks on phone: Not on file    Gets together: Not on file    Attends religious service: Not on file    Active member of club or organization: Not on file    Attends meetings of clubs or organizations: Not on file    Relationship status: Not on file  . Intimate partner violence:    Fear of current or ex partner: Not on file    Emotionally abused: Not on file    Physically abused: Not on file    Forced sexual activity: Not on file  Other Topics Concern  . Not on file  Social History Narrative  . Not on file      Review of systems: Review of Systems  Constitutional: Negative for fever and chills.  HENT: Negative.   Eyes: Negative for blurred vision.  Respiratory: Negative for cough, shortness of breath and wheezing.   Cardiovascular: Negative for chest pain and palpitations.  Gastrointestinal: as per HPI Genitourinary: Negative for dysuria, urgency, frequency and hematuria.  Musculoskeletal: Positive for myalgias, back pain and joint pain.  Skin: Negative for itching and rash.  Neurological: Negative for dizziness, tremors, focal weakness, seizures and loss of consciousness.  Endo/Heme/Allergies: Negative for seasonal allergies.  Psychiatric/Behavioral: Negative for depression, suicidal ideas and hallucinations.  All other systems reviewed and are negative.   Physical Exam: Vitals:   05/04/18 1407  BP: 140/88  Pulse: 83   Body mass index is 61.66 kg/m. Gen:      No acute distress HEENT:  EOMI, sclera anicteric Neck:     No masses; no thyromegaly Lungs:    Clear to auscultation bilaterally; normal respiratory effort CV:         Regular rate and rhythm; no murmurs Abd:      + bowel sounds; soft, non-tender; no palpable masses, no distension Ext:    No edema; adequate peripheral perfusion Skin:      Warm and dry; no rash Neuro: alert and oriented x 3 Psych: normal mood and  affect  Data Reviewed:  Reviewed labs, radiology imaging, old records and pertinent past GI work up   Assessment and Plan/Recommendations:  58 year old female with history of hypertension, type 2 diabetes, obstructive sleep apnea, morbid obesity with BMI over 60. Intermittent rectal bleeding associated with laxative use ?  Secondary to small-volume hemorrhage from hemorrhoids Never had colonoscopy for colorectal cancer screening We will schedule colonoscopy to evaluate.   The risks and benefits as well as alternatives of endoscopic procedure(s) have been discussed and reviewed. All questions answered. The patient agrees to proceed.     Damaris Hippo ,  MD [phone]    CC: Charlott Rakes, MD

## 2018-05-06 ENCOUNTER — Encounter: Payer: Self-pay | Admitting: Gastroenterology

## 2018-05-18 ENCOUNTER — Other Ambulatory Visit: Payer: Self-pay | Admitting: Physical Medicine & Rehabilitation

## 2018-05-19 MED FILL — DULoxetine HCL 60 MG CPEP: 60 | 30 days supply | Qty: 30 | Fill #0

## 2018-05-22 MED FILL — LISINOPRIL 5 MG TAB: 5 | 30 days supply | Qty: 30 | Fill #2

## 2018-06-01 MED FILL — ATORVASTATIN 20 MG TABLET: 20 | 30 days supply | Qty: 30 | Fill #2

## 2018-06-01 MED FILL — levETIRAcetam 500 MG TABS: 500 | 30 days supply | Qty: 90 | Fill #1

## 2018-06-01 MED FILL — METHOCARBAMOL 750 MG TABS: 750 | 30 days supply | Qty: 60 | Fill #1

## 2018-06-05 ENCOUNTER — Encounter: Payer: Self-pay | Admitting: Physical Medicine & Rehabilitation

## 2018-06-05 ENCOUNTER — Other Ambulatory Visit: Payer: Self-pay

## 2018-06-05 ENCOUNTER — Encounter: Payer: Medicaid Other | Attending: Physical Medicine & Rehabilitation | Admitting: Physical Medicine & Rehabilitation

## 2018-06-05 VITALS — BP 160/86 | HR 71 | Ht <= 58 in | Wt 298.0 lb

## 2018-06-05 DIAGNOSIS — I10 Essential (primary) hypertension: Secondary | ICD-10-CM | POA: Diagnosis present

## 2018-06-05 DIAGNOSIS — M7541 Impingement syndrome of right shoulder: Secondary | ICD-10-CM | POA: Insufficient documentation

## 2018-06-05 DIAGNOSIS — I69398 Other sequelae of cerebral infarction: Secondary | ICD-10-CM | POA: Diagnosis present

## 2018-06-05 DIAGNOSIS — F329 Major depressive disorder, single episode, unspecified: Secondary | ICD-10-CM

## 2018-06-05 DIAGNOSIS — M25511 Pain in right shoulder: Secondary | ICD-10-CM

## 2018-06-05 DIAGNOSIS — R269 Unspecified abnormalities of gait and mobility: Secondary | ICD-10-CM

## 2018-06-05 DIAGNOSIS — G8929 Other chronic pain: Secondary | ICD-10-CM | POA: Diagnosis present

## 2018-06-05 NOTE — Progress Notes (Signed)
Subjective:    Patient ID: [name], female    DOB: Dec 12, 1960, 58 y.o.   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  Last clinic visit 02/05/18.  Since last visit, patient went to neurology GI and ED x2.  Notes reviewed.  She is scheduled to have a colonoscopy.  She was diagnosed with lumbar radiculopathy after fall in the ED x2. Patient states she is doing a lot better.  She is walking only. She has not lost any weight.  She is taking Robaxin 2/week. Denies falls recently. Shoulder still "hurts a little". BP is elevated, states she was rushing to come here. Noctural urinary freq has improved.    Pain Inventory Average Pain  6 Pain Right Now 4 My pain is sharp, burning, stabbing and aching  In the last 24 hours, has pain interfered with the following? General activity 4 Relation with others 5 Enjoyment of life 5 What TIME of day is your pain at its worst? evening night Sleep (in general) Fair  Pain is worse with: some activites Pain improves with: rest and medication Relief from Meds: 7  Mobility walk with assistance use a cane ability to climb steps?  no do you drive?  yes transfers alone  Function disabled: date disabled . I need assistance with the following:  household duties and shopping  Neuro/Psych trouble walking dizziness depression  Prior Studies Any changes since last visit?  no  Physicians involved in your care Any changes since last visit?  no   Family History  Problem Relation Age of Onset  . Diabetes Father   . Hypertension Father   . Hypertension Sister   . Stroke Brother    Social History   Socioeconomic History  . Marital status: Married    Spouse name: Not on file  . Number of children: Not on file  . Years of education: Not on file  . Highest education level: Not on file  Occupational History   . Not on file  Social Needs  . Financial resource strain: Not on file  . Food insecurity:    Worry: Not on file    Inability: Not on file  . Transportation needs:    Medical: Not on file    Non-medical: Not on file  Tobacco Use  . Smoking status: Never Smoker  . Smokeless tobacco: Never Used  Substance and Sexual Activity  . Alcohol use: No  . Drug use: No  . Sexual activity: Not Currently  Lifestyle  . Physical activity:    Days per week: Not on file    Minutes per session: Not on file  . Stress: Not on file  Relationships  . Social connections:    Talks on phone: Not on file    Gets together: Not on file    Attends religious service: Not on file    Active member of club or organization: Not on file    Attends meetings of clubs or organizations: Not on file    Relationship status: Not on file  Other Topics Concern  . Not on file  Social History Narrative  . Not on file   Past Surgical History:  Procedure Laterality Date  . CARDIAC CATHETERIZATION  04/2010   Dr. Terrence Dupont   Past Medical History:  Diagnosis Date  . Headache   . Hypertension   . Iron deficiency anemia   .  Seizures ([place])   . Stroke ([place])   . Type 2 diabetes mellitus ([facility])    BP (!) 160/86   Pulse 71   Ht 4\' 10"  (1.473 m)   Wt 298 lb (135.2 kg)   LMP 02/17/2011 (Exact Date)   SpO2 91%   BMI 62.28 kg/m   Opioid Risk Score:   Fall Risk Score:  `1  Depression screen PHQ 2/9  Depression screen [facility] 2/9 06/05/2018 04/22/2018 04/22/2018 10/31/2017 10/15/2017 08/29/2017 07/16/2017  Decreased Interest 1 2 2 1 2  0 1  Down, Depressed, Hopeless 1 2 2 1 1  0 1  PHQ - 2 Score 2 4 4 2 3  0 2  Altered sleeping - 2 2 - 2 - 2  Tired, decreased energy - 2 2 - 1 - 1  Change in appetite - 2 2 - 1 - 1  Feeling bad or failure about yourself  - 2 2 - 1 - 1  Trouble concentrating - 0 0 - 0 - 0  Moving slowly or fidgety/restless - 2 2 - 1 - 0  Suicidal thoughts - 0 0 - 0 - 0  PHQ-9 Score - 14 14 - 9 - 7  Difficult  doing work/chores - - - - - - -  Some recent data might be hidden    Review of Systems  Constitutional: Positive for diaphoresis and unexpected weight change.       Bladder control problems   HENT: Negative.   Eyes: Negative.   Respiratory: Positive for apnea, cough, shortness of breath and wheezing.   Cardiovascular: Positive for leg swelling.  Gastrointestinal: Positive for diarrhea.  Endocrine: Negative.   Genitourinary: Negative.   Musculoskeletal: Positive for arthralgias, back pain and gait problem.       Spasms   Skin: Negative.   Neurological: Positive for weakness.       Tingling    Hematological: Negative.   Psychiatric/Behavioral: Positive for confusion and dysphoric mood.  All other systems reviewed and are negative.     Objective:   Physical Exam Constitutional: She appears well-developed and well-nourished. NAD. Morbidly obese.  HENT: Normocephalic and atraumatic.  Eyes: EOMI. No discharge.  Cardiovascular: RRR. No JVD. Respiratory: Effort normal and breath sounds normal.  GI: Soft. Bowel sounds are normal.  Musculoskeletal:  Gait: Slow cadence with cane No edema. +TTP right shoulder. Neurological: She is alert and oriented.  Motor: RUE: 4/5 throughout. RLE: 4/5 HF, 4/5 distally Sensation intact to light touch Skin: Skin is warm and dry. Intact.  Psychiatric: She has a normal mood and affect.      Assessment & Plan:  58 year old RH-female with history of morbid obesity, HTN, iron deficiency anemia who presents for follow up after right temporal intracranial hemorrhage.  1.S/p right temporal intracranial hemorrhage with dysesthesias  Side effects with gabapentin             Aquatic therapy denied by insurance, encouraged follow up at [facility] x5, pt states she does not like the water  MRI reviewed - stable infarct with empty sella persistent  Cont meds  Cont Cymbalta               2. Depression     Reaction to Prozaac  Pt states she is managing  with her chaplain and scriptures  Cont duloxetine.  3. Seizures             Cont meds  4. Morbid obesity  No longer seeing dietitian, states she never  received an appointment to follow up, recommended making appointment             Cont to encourage weight loss again, gaiting weight  5. Muscle spasms             Encouraged ROM and stretching  Cont Robaxin 750 BID PRN  6. Abnormality of gait - post stroke             Cont cane  Therapies completed, cont HEP  Discussed environmental modifications  7. Right shoulder pain - Rotator cuff tendinopathy             Xrays reviewed previously with some degenerative change right shoulder  Will consider steroid injection in future, she still does not want at present  Cont Voltaren gel  Significantly improved  8. Right leg pain  See #5, #7  Pt to discuss with Neuro for Naproxen  9. Falls  In sleep  Encouraged pt to push bed against wall or place furniture against bed to limit chances of rolling onto floor  See #6  Improving  10. 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 Urinary frequency  Mainly nocturnal  Encouraged decrease in nighttime intake and increase daytime fluid intake  Improved  13. ?GERD  Follow up with PCP

## 2018-06-09 MED FILL — METOPROLOL TARTRATE 100 MG: 100 | 30 days supply | Qty: 60 | Fill #2

## 2018-06-10 ENCOUNTER — Encounter: Payer: Medicaid Other | Admitting: Physical Medicine & Rehabilitation

## 2018-06-10 ENCOUNTER — Telehealth: Payer: Self-pay | Admitting: *Deleted

## 2018-06-10 NOTE — Telephone Encounter (Signed)
Left message for patient that we have cancelled [facility] case for 06/16/2018 and we will contact her to reschedule at a later date

## 2018-06-15 ENCOUNTER — Ambulatory Visit: Payer: Medicaid Other | Admitting: Family Medicine

## 2018-06-16 ENCOUNTER — Encounter (HOSPITAL_COMMUNITY): Admission: RE | Payer: Self-pay | Source: Home / Self Care

## 2018-06-16 ENCOUNTER — Ambulatory Visit (HOSPITAL_COMMUNITY): Admission: RE | Admit: 2018-06-16 | Payer: Medicaid Other | Source: Home / Self Care | Admitting: Gastroenterology

## 2018-06-16 SURGERY — COLONOSCOPY WITH PROPOFOL
Anesthesia: Monitor Anesthesia Care

## 2018-06-20 MED FILL — METFORMIN HCL ER 500 MG TAB: 500 | 90 days supply | Qty: 180 | Fill #2

## 2018-06-20 MED FILL — DULoxetine HCL 60 MG CPEP: 60 | 30 days supply | Qty: 30 | Fill #1

## 2018-07-04 ENCOUNTER — Other Ambulatory Visit: Payer: Self-pay | Admitting: Family Medicine

## 2018-07-04 DIAGNOSIS — R42 Dizziness and giddiness: Secondary | ICD-10-CM

## 2018-07-04 MED FILL — LISINOPRIL 5 MG TAB: 5 | 30 days supply | Qty: 30 | Fill #3

## 2018-07-06 MED FILL — ATORVASTATIN 20 MG TABLET: 20 | 90 days supply | Qty: 90 | Fill #3

## 2018-07-14 ENCOUNTER — Other Ambulatory Visit: Payer: Self-pay | Admitting: Family Medicine

## 2018-07-14 DIAGNOSIS — I10 Essential (primary) hypertension: Secondary | ICD-10-CM

## 2018-07-14 MED FILL — MECLIZINE 25 MG TABLET: 25 | 60 days supply | Qty: 120 | Fill #0

## 2018-07-14 MED FILL — METOPROLOL TARTRATE 100 MG: 100 | 90 days supply | Qty: 180 | Fill #0

## 2018-07-20 ENCOUNTER — Encounter: Payer: Self-pay | Admitting: Family Medicine

## 2018-07-20 ENCOUNTER — Ambulatory Visit: Payer: Medicaid Other | Attending: Family Medicine | Admitting: Family Medicine

## 2018-07-20 ENCOUNTER — Other Ambulatory Visit: Payer: Self-pay

## 2018-07-20 DIAGNOSIS — I69398 Other sequelae of cerebral infarction: Secondary | ICD-10-CM

## 2018-07-20 DIAGNOSIS — M25511 Pain in right shoulder: Secondary | ICD-10-CM

## 2018-07-20 DIAGNOSIS — E119 Type 2 diabetes mellitus without complications: Secondary | ICD-10-CM

## 2018-07-20 DIAGNOSIS — I10 Essential (primary) hypertension: Secondary | ICD-10-CM

## 2018-07-20 DIAGNOSIS — R269 Unspecified abnormalities of gait and mobility: Secondary | ICD-10-CM

## 2018-07-20 DIAGNOSIS — G8929 Other chronic pain: Secondary | ICD-10-CM

## 2018-07-20 NOTE — Progress Notes (Signed)
Patient has been called and DOB has been verified. Patient has been screened and transferred to PCP to start phone visit.  C/C: Diabetes,diarrhea.

## 2018-07-20 NOTE — Progress Notes (Signed)
Virtual Visit via Telephone Note  I connected with [name], on 07/20/2018 at 2:04 PM by telephone and verified that I am speaking with the correct person using two identifiers.   Consent: I discussed the limitations, risks, security and privacy concerns of performing an evaluation and management service by telephone and the availability of in person appointments. I also discussed with the patient that there may be a patient responsible charge related to this service. The patient expressed understanding and agreed to proceed.   Location of Patient: Home  Location of Provider: Clinic   Persons participating in Telemedicine visit: Aarin Sparkman Farrington-CMA Dr. 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 She continues to have right shoulder pain for which she was prescribed Voltaren gel by her rehab physician however she used it for some time and after that medication was not available at the pharmacy.  She currently uses an OTC cream which provides minimal relief. She denies recent falls and ambulates with the aid of a cane. She does not have a glucometer at home and has not been checking her sugars but denies hypoglycemic symptoms. Denies chest pains, dyspnea, palpitations. Tolerates antihypertensive with no adverse effects.  She did experience a cold sweat on waking up this morning which lasted a short period of time.  She has not had similar episodes in the past and denies presence of hot flashes.  Denies fever, myalgias and feels fine now.   Past Medical History:  Diagnosis Date  . Headache   . Hypertension   . Iron deficiency anemia   . Seizures ([place])   . Stroke ([place])   . Type 2 diabetes mellitus (HCC)    Allergies  Allergen Reactions  . Bee Venom  Swelling    Facial swelling  . Eggs Or Egg-Derived Products Nausea And Vomiting and Swelling    Mouth swelled  . Gabapentin     Jittery feeling, dyspnea, crawling sensation  . Gadolinium Derivatives Itching    Immediately after gad injection pt. complained of tongue numbness and lower lip/ then had rt side of body tingling/ pt. Was assessed by Dr. Jeralyn Ruths before leaving/ no medications were administered/    Current Outpatient Medications on File Prior to Visit  Medication Sig Dispense Refill  . acetaminophen (TYLENOL) 500 MG tablet Take 500 mg by mouth every 6 (six) hours as needed for headache (pain).    Marland Kitchen atorvastatin (LIPITOR) 20 MG tablet Take 1 tablet (20 mg total) by mouth daily. 30 tablet 6  . cetirizine (ZYRTEC) 10 MG tablet Take 10 mg by mouth daily as needed (allergic reaction).    . diphenhydrAMINE (BENADRYL) 25 MG tablet Take 25 mg by mouth daily as needed for allergies (bee stings).    . DULoxetine (CYMBALTA) 60 MG capsule TAKE 1 CAPSULE DAILY BY MOUTH. 30 capsule 1  . fluticasone (FLONASE) 50 MCG/ACT nasal spray Place 1 spray into both nostrils daily as needed for allergies or rhinitis. 16 g 1  . hydroxypropyl methylcellulose / hypromellose (ISOPTO TEARS / GONIOVISC) 2.5 % ophthalmic solution Place 2 drops into both eyes as needed (irritation). 15 mL 1  . levETIRAcetam (KEPPRA) 500 MG tablet TAKE 1 & 1/2 TABLETS BY MOUTH TWICE DAILY. 90 tablet 8  . lidocaine (LIDODERM) 5 % Place 1 patch onto the  skin daily. Remove & Discard patch within 12 hours or as directed by MD 30 patch 0  . lisinopril (PRINIVIL,ZESTRIL) 5 MG tablet Take 1 tablet (5 mg total) by mouth daily. 30 tablet 6  . meclizine (ANTIVERT) 25 MG tablet TAKE 1 TABLET (25 MG TOTAL) BY MOUTH 2 (TWO) TIMES DAILY AS NEEDED FOR DIZZINESS. 60 tablet 1  . metFORMIN (GLUCOPHAGE XR) 500 MG 24 hr tablet Take 2 tablets (1,000 mg total) by mouth 2 (two) times daily. 120 tablet 6  . methocarbamol (ROBAXIN) 750 MG tablet TAKE 1 TABLET  BY MOUTH 2 TIMES DAILY AS NEEDED FOR MUSCLE SPASMS. 60 tablet 1  . metoprolol tartrate (LOPRESSOR) 100 MG tablet TAKE 1 TABLET BY MOUTH 2 TIMES DAILY. 60 tablet 2  . Na Sulfate-K Sulfate-Mg Sulf 17.5-3.13-1.6 GM/177ML SOLN 1 suprep kit for colonoscopy 354 mL 0  . diclofenac sodium (VOLTAREN) 1 % GEL Apply 2 g topically 4 (four) times daily. (Patient not taking: Reported on 07/20/2018) 1 Tube 3   No current facility-administered medications on file prior to visit.     Observations/Objective: Awake, alert, oriented x3 Not in acute distress  CMP Latest Ref Rng & Units 03/23/2018 02/24/2018 10/15/2017  Glucose 65 - 99 mg/dL 100(H) 134(H) 177(H)  BUN 6 - 24 mg/dL '13 19 17  ' Creatinine 0.57 - 1.00 mg/dL 1.02(H) 0.95 1.03(H)  Sodium 134 - 144 mmol/L 145(H) 138 142  Potassium 3.5 - 5.2 mmol/L 4.1 3.2(L) 3.7  Chloride 96 - 106 mmol/L 104 97 98  CO2 20 - 29 mmol/L 23 33(H) 27  Calcium 8.7 - 10.2 mg/dL 9.3 9.2 9.3  Total Protein 6.0 - 8.5 g/dL - - 7.7  Total Bilirubin 0.0 - 1.2 mg/dL - - 0.3  Alkaline Phos 39 - 117 IU/L - - 73  AST 0 - 40 IU/L - - 14  ALT 0 - 32 IU/L - - 12    Lipid Panel     Component Value Date/Time   CHOL 74 (L) 10/15/2017 1100   TRIG 86 10/15/2017 1100   HDL 33 (L) 10/15/2017 1100   CHOLHDL 2.2 10/15/2017 1100   CHOLHDL 2.7 10/08/2015 0541   VLDL 12 10/08/2015 0541   LDLCALC 24 10/15/2017 1100    Lab Results  Component Value Date   HGBA1C 6.7 03/09/2018    Assessment and Plan: 1. Type 2 diabetes mellitus without complication, without long-term current use of insulin (HCC) Controlled with A1c of 6.7 Continue metformin Counseled on Diabetic diet, my plate method, 546 minutes of moderate intensity exercise/week Keep blood sugar logs with fasting goals of 80-120 mg/dl, random of less than 180 and in the event of sugars less than 60 mg/dl or greater than 400 mg/dl please notify the clinic ASAP. It is recommended that you undergo annual eye exams and annual foot  exams. Pneumonia vaccine is recommended.   2. Abnormality of gait following cerebrovascular accident (CVA) Ambulates with the aid of a cane No recent falls She is high risk for falls and been advised to change positions slowly  3. Chronic right shoulder pain Uncontrolled Unable to obtain Voltaren gel which was prescribed by rehab medicine Apply ice She uses an OTC cream which has been beneficial Advised to engage in home exercises.  4. Benign essential HTN Controlled from previous visits Continue antihypertensive Counseled on blood pressure goal of less than 130/80, low-sodium, DASH diet, medication compliance, 150 minutes of moderate intensity exercise per week. Discussed medication compliance, adverse effects.    Follow Up  Instructions: Return in about 3 months (around 10/19/2018) for call back for appointment.    I discussed the assessment and treatment plan with the patient. The patient was provided an opportunity to ask questions and all were answered. The patient agreed with the plan and demonstrated an understanding of the instructions.   The patient was advised to call back or seek an in-person evaluation if the symptoms worsen or if the condition fails to improve as anticipated.     I provided 25 minutes total of non-face-to-face time during this encounter including median intraservice time, reviewing previous notes, labs, imaging, medications and explaining diagnosis and management.     Charlott Rakes, MD, FAAFP. [facility] [place], [place]   07/20/2018, 2:04 PM

## 2018-07-24 ENCOUNTER — Other Ambulatory Visit: Payer: Self-pay | Admitting: Physical Medicine & Rehabilitation

## 2018-07-24 MED FILL — levETIRAcetam 500 MG TABS: 500 | 30 days supply | Qty: 90 | Fill #2

## 2018-07-24 MED FILL — DULoxetine HCL 60 MG CPEP: 60 | 30 days supply | Qty: 30 | Fill #0

## 2018-08-20 MED FILL — levETIRAcetam 500 MG TABS: 500 | 30 days supply | Qty: 90 | Fill #3

## 2018-08-20 MED FILL — DULoxetine HCL 60 MG CPEP: 60 | 30 days supply | Qty: 30 | Fill #1

## 2018-08-20 MED FILL — LISINOPRIL 5 MG TAB: 5 | 30 days supply | Qty: 30 | Fill #4

## 2018-10-20 ENCOUNTER — Ambulatory Visit: Payer: Medicare Other | Attending: Family Medicine | Admitting: Family Medicine

## 2018-10-20 ENCOUNTER — Encounter: Payer: Self-pay | Admitting: Family Medicine

## 2018-10-20 ENCOUNTER — Other Ambulatory Visit: Payer: Self-pay

## 2018-10-20 VITALS — BP 132/81 | HR 99 | Temp 98.3°F | Ht <= 58 in | Wt 298.8 lb

## 2018-10-20 DIAGNOSIS — Z8673 Personal history of transient ischemic attack (TIA), and cerebral infarction without residual deficits: Secondary | ICD-10-CM | POA: Diagnosis not present

## 2018-10-20 DIAGNOSIS — E119 Type 2 diabetes mellitus without complications: Secondary | ICD-10-CM | POA: Diagnosis present

## 2018-10-20 DIAGNOSIS — E1165 Type 2 diabetes mellitus with hyperglycemia: Secondary | ICD-10-CM

## 2018-10-20 DIAGNOSIS — G4489 Other headache syndrome: Secondary | ICD-10-CM | POA: Insufficient documentation

## 2018-10-20 DIAGNOSIS — Z7984 Long term (current) use of oral hypoglycemic drugs: Secondary | ICD-10-CM | POA: Diagnosis not present

## 2018-10-20 DIAGNOSIS — Z888 Allergy status to other drugs, medicaments and biological substances status: Secondary | ICD-10-CM | POA: Insufficient documentation

## 2018-10-20 DIAGNOSIS — E785 Hyperlipidemia, unspecified: Secondary | ICD-10-CM | POA: Diagnosis not present

## 2018-10-20 DIAGNOSIS — I10 Essential (primary) hypertension: Secondary | ICD-10-CM

## 2018-10-20 DIAGNOSIS — I611 Nontraumatic intracerebral hemorrhage in hemisphere, cortical: Secondary | ICD-10-CM

## 2018-10-20 DIAGNOSIS — Z6841 Body Mass Index (BMI) 40.0 and over, adult: Secondary | ICD-10-CM | POA: Insufficient documentation

## 2018-10-20 DIAGNOSIS — Z8249 Family history of ischemic heart disease and other diseases of the circulatory system: Secondary | ICD-10-CM | POA: Insufficient documentation

## 2018-10-20 DIAGNOSIS — Z79899 Other long term (current) drug therapy: Secondary | ICD-10-CM | POA: Diagnosis not present

## 2018-10-20 LAB — POCT GLYCOSYLATED HEMOGLOBIN (HGB A1C): HbA1c, POC (controlled diabetic range): 7.9 % — AB (ref 0.0–7.0)

## 2018-10-20 LAB — GLUCOSE, POCT (MANUAL RESULT ENTRY): POC Glucose: 236 mg/dl — AB (ref 70–99)

## 2018-10-20 MED ORDER — LISINOPRIL 5 MG PO TABS: 5.0000 mg | ORAL_TABLET | Freq: Every day | ORAL | 6 refills | Status: DC

## 2018-10-20 MED ORDER — GLIPIZIDE 5 MG PO TABS: 2.5000 mg | ORAL_TABLET | Freq: Two times a day (BID) | ORAL | 3 refills | Status: DC

## 2018-10-20 MED ORDER — ATORVASTATIN CALCIUM 20 MG PO TABS: 20.0000 mg | ORAL_TABLET | Freq: Every day | ORAL | 6 refills | Status: DC

## 2018-10-20 MED ORDER — ACCU-CHEK AVIVA DEVI: 0 refills | Status: DC

## 2018-10-20 MED ORDER — ACCU-CHEK AVIVA VI STRP: ORAL_STRIP | 12 refills | Status: DC

## 2018-10-20 MED ORDER — METOPROLOL TARTRATE 100 MG PO TABS: 100.0000 mg | ORAL_TABLET | Freq: Two times a day (BID) | ORAL | 6 refills | Status: DC

## 2018-10-20 MED ORDER — METFORMIN HCL ER 500 MG PO TB24: 1000.0000 mg | ORAL_TABLET | Freq: Two times a day (BID) | ORAL | 6 refills | Status: DC

## 2018-10-20 MED ORDER — FLUTICASONE PROPIONATE 50 MCG/ACT NA SUSP: 1.0000 | Freq: Every day | NASAL | 1 refills | Status: DC | PRN

## 2018-10-20 MED FILL — ATORVASTATIN 20 MG TABLET: 20 | 90 days supply | Qty: 90 | Fill #0

## 2018-10-20 MED FILL — METFORMIN HCL ER 500 MG TB2: 500 | 90 days supply | Qty: 360 | Fill #0

## 2018-10-20 MED FILL — METOPROLOL TARTRATE 100 MG: 100 | 90 days supply | Qty: 180 | Fill #0

## 2018-10-20 MED FILL — FLUTICASONE PROP 50 MCG SPR: 50 | 30 days supply | Qty: 16 | Fill #0

## 2018-10-20 MED FILL — LISINOPRIL 5 MG TAB: 5 | 30 days supply | Qty: 30 | Fill #5

## 2018-10-20 MED FILL — glipiZIDE 5 MG TABS: 5 | 90 days supply | Qty: 90 | Fill #0

## 2018-10-20 NOTE — Progress Notes (Signed)
Patient states that her entire body is aching.

## 2018-10-20 NOTE — Patient Instructions (Signed)
° °Calorie Counting for Weight Loss °Calories are units of energy. Your body needs a certain amount of calories from food to keep you going throughout the day. When you eat more calories than your body needs, your body stores the extra calories as fat. When you eat fewer calories than your body needs, your body burns fat to get the energy it needs. °Calorie counting means keeping track of how many calories you eat and drink each day. Calorie counting can be helpful if you need to lose weight. If you make sure to eat fewer calories than your body needs, you should lose weight. Ask your health care provider what a healthy weight is for you. °For calorie counting to work, you will need to eat the right number of calories in a day in order to lose a healthy amount of weight per week. A dietitian can help you determine how many calories you need in a day and will give you suggestions on how to reach your calorie goal. °· A healthy amount of weight to lose per week is usually 1-2 lb (0.5-0.9 kg). This usually means that your daily calorie intake should be reduced by 500-750 calories. °· Eating 1,200 - 1,500 calories per day can help most women lose weight. °· Eating 1,500 - 1,800 calories per day can help most men lose weight. °What is my plan? °My goal is to have __________ calories per day. °If I have this many calories per day, I should lose around __________ pounds per week. °What do I need to know about calorie counting? °In order to meet your daily calorie goal, you will need to: °· Find out how many calories are in each food you would like to eat. Try to do this before you eat. °· Decide how much of the food you plan to eat. °· Write down what you ate and how many calories it had. Doing this is called keeping a food log. °To successfully lose weight, it is important to balance calorie counting with a healthy lifestyle that includes regular activity. Aim for 150 minutes of moderate exercise (such as walking) or 75  minutes of vigorous exercise (such as running) each week. °Where do I find calorie information? ° °The number of calories in a food can be found on a Nutrition Facts label. If a food does not have a Nutrition Facts label, try to look up the calories online or ask your dietitian for help. °Remember that calories are listed per serving. If you choose to have more than one serving of a food, you will have to multiply the calories per serving by the amount of servings you plan to eat. For example, the label on a package of bread might say that a serving size is 1 slice and that there are 90 calories in a serving. If you eat 1 slice, you will have eaten 90 calories. If you eat 2 slices, you will have eaten 180 calories. °How do I keep a food log? °Immediately after each meal, record the following information in your food log: °· What you ate. Don't forget to include toppings, sauces, and other extras on the food. °· How much you ate. This can be measured in cups, ounces, or number of items. °· How many calories each food and drink had. °· The total number of calories in the meal. °Keep your food log near you, such as in a small notebook in your pocket, or use a mobile app or website. Some programs will   calculate calories for you and show you how many calories you have left for the day to meet your goal. °What are some calorie counting tips? ° °· Use your calories on foods and drinks that will fill you up and not leave you hungry: °? Some examples of foods that fill you up are nuts and nut butters, vegetables, lean proteins, and high-fiber foods like whole grains. High-fiber foods are foods with more than 5 g fiber per serving. °? Drinks such as sodas, specialty coffee drinks, alcohol, and juices have a lot of calories, yet do not fill you up. °· Eat nutritious foods and avoid empty calories. Empty calories are calories you get from foods or beverages that do not have many vitamins or protein, such as candy, sweets, and  soda. It is better to have a nutritious high-calorie food (such as an avocado) than a food with few nutrients (such as a bag of chips). °· Know how many calories are in the foods you eat most often. This will help you calculate calorie counts faster. °· Pay attention to calories in drinks. Low-calorie drinks include water and unsweetened drinks. °· Pay attention to nutrition labels for "low fat" or "fat free" foods. These foods sometimes have the same amount of calories or more calories than the full fat versions. They also often have added sugar, starch, or salt, to make up for flavor that was removed with the fat. °· Find a way of tracking calories that works for you. Get creative. Try different apps or programs if writing down calories does not work for you. °What are some portion control tips? °· Know how many calories are in a serving. This will help you know how many servings of a certain food you can have. °· Use a measuring cup to measure serving sizes. You could also try weighing out portions on a kitchen scale. With time, you will be able to estimate serving sizes for some foods. °· Take some time to put servings of different foods on your favorite plates, bowls, and cups so you know what a serving looks like. °· Try not to eat straight from a bag or box. Doing this can lead to overeating. Put the amount you would like to eat in a cup or on a plate to make sure you are eating the right portion. °· Use smaller plates, glasses, and bowls to prevent overeating. °· Try not to multitask (for example, watch TV or use your computer) while eating. If it is time to eat, sit down at a table and enjoy your food. This will help you to know when you are full. It will also help you to be aware of what you are eating and how much you are eating. °What are tips for following this plan? °Reading food labels °· Check the calorie count compared to the serving size. The serving size may be smaller than what you are used to  eating. °· Check the source of the calories. Make sure the food you are eating is high in vitamins and protein and low in saturated and trans fats. °Shopping °· Read nutrition labels while you shop. This will help you make healthy decisions before you decide to purchase your food. °· Make a grocery list and stick to it. °Cooking °· Try to cook your favorite foods in a healthier way. For example, try baking instead of frying. °· Use low-fat dairy products. °Meal planning °· Use more fruits and vegetables. Half of your plate should be   fruits and vegetables. °· Include lean proteins like poultry and fish. °How do I count calories when eating out? °· Ask for smaller portion sizes. °· Consider sharing an entree and sides instead of getting your own entree. °· If you get your own entree, eat only half. Ask for a box at the beginning of your meal and put the rest of your entree in it so you are not tempted to eat it. °· If calories are listed on the menu, choose the lower calorie options. °· Choose dishes that include vegetables, fruits, whole grains, low-fat dairy products, and lean protein. °· Choose items that are boiled, broiled, grilled, or steamed. Stay away from items that are buttered, battered, fried, or served with cream sauce. Items labeled "crispy" are usually fried, unless stated otherwise. °· Choose water, low-fat milk, unsweetened iced tea, or other drinks without added sugar. If you want an alcoholic beverage, choose a lower calorie option such as a glass of wine or light beer. °· Ask for dressings, sauces, and syrups on the side. These are usually high in calories, so you should limit the amount you eat. °· If you want a salad, choose a garden salad and ask for grilled meats. Avoid extra toppings like bacon, cheese, or fried items. Ask for the dressing on the side, or ask for olive oil and vinegar or lemon to use as dressing. °· Estimate how many servings of a food you are given. For example, a serving of  cooked rice is ½ cup or about the size of half a baseball. Knowing serving sizes will help you be aware of how much food you are eating at restaurants. The list below tells you how big or small some common portion sizes are based on everyday objects: °? 1 oz--4 stacked dice. °? 3 oz--1 deck of cards. °? 1 tsp--1 die. °? 1 Tbsp--½ a ping-pong ball. °? 2 Tbsp--1 ping-pong ball. °? ½ cup--½ baseball. °? 1 cup--1 baseball. °Summary °· Calorie counting means keeping track of how many calories you eat and drink each day. If you eat fewer calories than your body needs, you should lose weight. °· A healthy amount of weight to lose per week is usually 1-2 lb (0.5-0.9 kg). This usually means reducing your daily calorie intake by 500-750 calories. °· The number of calories in a food can be found on a Nutrition Facts label. If a food does not have a Nutrition Facts label, try to look up the calories online or ask your dietitian for help. °· Use your calories on foods and drinks that will fill you up, and not on foods and drinks that will leave you hungry. °· Use smaller plates, glasses, and bowls to prevent overeating. °This information is not intended to replace advice given to you by your health care provider. Make sure you discuss any questions you have with your health care provider. °Document Released: 03/11/2005 Document Revised: 11/28/2017 Document Reviewed: 02/09/2016 °Elsevier Patient Education © 2020 [employer] ° °

## 2018-10-20 NOTE — Progress Notes (Signed)
Subjective:  Patient ID: MECCA GUITRON, female    DOB: 12-25-1960  Age: 58 y.o. 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 Over the last 8 days she has had headaches and has had generalized body aches. Also states that during that timeframe her air conditioning unit was out and she had no air and it was hot in the house. Headache is absent at this time and she has no body aches.  Also denies fever, sinus congestion, nausea, blurry vision. She uses a CPAP machine to sleep but has not been compliant with it.  Her A1c is 7.9 which is up from 6.7 previously and she endorses drinking lots of sodas due to the heat.  She denies hypoglycemia, neuropathy. Compliant with her antihypertensive and denies adverse effects from the medication. She does not exercise as she is unable to walk for fear of falling over and her husband is usually at work and she has no one to walk with her.  She is mostly sedentary.  Past Medical History:  Diagnosis Date   Headache    Hypertension    Iron deficiency anemia    Seizures (HCC)    Stroke ([place])    Type 2 diabetes mellitus ([place])     Past Surgical History:  Procedure Laterality Date   CARDIAC CATHETERIZATION  04/2010   Dr. Terrence Dupont    Family History  Problem Relation Age of Onset   Diabetes Father    Hypertension Father    Hypertension Sister    Stroke Brother     Allergies  Allergen Reactions   Bee Venom Swelling    Facial swelling   Eggs Or Egg-Derived Products Nausea And Vomiting and Swelling    Mouth swelled   Gabapentin     Jittery feeling, dyspnea, crawling sensation   Gadolinium Derivatives Itching    Immediately after gad injection pt. complained of tongue numbness and lower lip/ then had rt side of body tingling/ pt.  Was assessed by Dr. Jeralyn Ruths before leaving/ no medications were administered/    Outpatient Medications Prior to Visit  Medication Sig Dispense Refill   acetaminophen (TYLENOL) 500 MG tablet Take 500 mg by mouth every 6 (six) hours as needed for headache (pain).     cetirizine (ZYRTEC) 10 MG tablet Take 10 mg by mouth daily as needed (allergic reaction).     diphenhydrAMINE (BENADRYL) 25 MG tablet Take 25 mg by mouth daily as needed for allergies (bee stings).     DULoxetine (CYMBALTA) 60 MG capsule TAKE 1 CAPSULE DAILY BY MOUTH. 30 capsule 2   hydroxypropyl methylcellulose / hypromellose (ISOPTO TEARS / GONIOVISC) 2.5 % ophthalmic solution Place 2 drops into both eyes as needed (irritation). 15 mL 1   levETIRAcetam (KEPPRA) 500 MG tablet TAKE 1 & 1/2 TABLETS BY MOUTH TWICE DAILY. 90 tablet 8   lidocaine (LIDODERM) 5 % Place 1 patch onto the skin daily. Remove & Discard patch within 12 hours or as directed by MD 30 patch 0   meclizine (ANTIVERT) 25 MG tablet TAKE 1 TABLET (25 MG TOTAL) BY MOUTH 2 (TWO) TIMES DAILY AS NEEDED FOR DIZZINESS. 60 tablet 1   methocarbamol (ROBAXIN) 750 MG tablet TAKE 1 TABLET BY MOUTH 2 TIMES DAILY AS NEEDED FOR MUSCLE SPASMS. 60 tablet 1   Na Sulfate-K Sulfate-Mg  Sulf 17.5-3.13-1.6 GM/177ML SOLN 1 suprep kit for colonoscopy 354 mL 0   atorvastatin (LIPITOR) 20 MG tablet Take 1 tablet (20 mg total) by mouth daily. 30 tablet 6   fluticasone (FLONASE) 50 MCG/ACT nasal spray Place 1 spray into both nostrils daily as needed for allergies or rhinitis. 16 g 1   lisinopril (PRINIVIL,ZESTRIL) 5 MG tablet Take 1 tablet (5 mg total) by mouth daily. 30 tablet 6   metFORMIN (GLUCOPHAGE XR) 500 MG 24 hr tablet Take 2 tablets (1,000 mg total) by mouth 2 (two) times daily. 120 tablet 6   metoprolol tartrate (LOPRESSOR) 100 MG tablet TAKE 1 TABLET BY MOUTH 2 TIMES DAILY. 60 tablet 2   diclofenac sodium (VOLTAREN) 1 % GEL Apply 2 g topically 4 (four) times daily. (Patient  not taking: Reported on 07/20/2018) 1 Tube 3   No facility-administered medications prior to visit.      ROS Review of Systems  Constitutional: Negative for activity change, appetite change and fatigue.  HENT: Negative for congestion, sinus pressure and sore throat.   Eyes: Negative for visual disturbance.  Respiratory: Negative for cough, chest tightness, shortness of breath and wheezing.   Cardiovascular: Negative for chest pain and palpitations.  Gastrointestinal: Negative for abdominal distention, abdominal pain and constipation.  Endocrine: Negative for polydipsia.  Genitourinary: Negative for dysuria and frequency.  Musculoskeletal: Negative for arthralgias and back pain.  Skin: Negative for rash.  Neurological: Positive for headaches. Negative for tremors, light-headedness and numbness.  Hematological: Does not bruise/bleed easily.  Psychiatric/Behavioral: Negative for agitation and behavioral problems.    Objective:  BP 132/81    Pulse 99    Temp 98.3 F (36.8 C) (Oral)    Ht _0  (1.473 m)    Wt 298 lb 12.8 oz (135.5 kg)    LMP 02/17/2011 (Exact Date)    SpO2 95%    BMI 62.45 kg/m   BP/Weight 10/20/2018 06/05/2018 09/18/348  Systolic BP 093 818 299  Diastolic BP 81 86 88  Wt. (Lbs) 298.8 298 295  BMI 62.45 62.28 61.66      Physical Exam Constitutional:      Appearance: She is well-developed. She is obese.  Cardiovascular:     Rate and Rhythm: Normal rate.     Heart sounds: Normal heart sounds. No murmur.  Pulmonary:     Effort: Pulmonary effort is normal.     Breath sounds: Normal breath sounds. No wheezing or rales.  Chest:     Chest wall: No tenderness.  Abdominal:     General: Bowel sounds are normal. There is no distension.     Palpations: Abdomen is soft. There is no mass.     Tenderness: There is no abdominal tenderness.  Musculoskeletal: Normal range of motion.  Neurological:     Mental Status: She is alert and oriented to person, place, and time.       CMP Latest Ref Rng & Units 03/23/2018 02/24/2018 10/15/2017  Glucose 65 - 99 mg/dL 100(H) 134(H) 177(H)  BUN 6 - 24 mg/dL _1 Creatinine 0.57 - 1.00 mg/dL 1.02(H) 0.95 1.03(H)  Sodium 134 - 144 mmol/L 145(H) 138 142  Potassium 3.5 - 5.2 mmol/L 4.1 3.2(L) 3.7  Chloride 96 - 106 mmol/L 104 97 98  CO2 20 - 29 mmol/L 23 33(H) 27  Calcium 8.7 - 10.2 mg/dL 9.3 9.2 9.3  Total Protein 6.0 - 8.5 g/dL - - 7.7  Total Bilirubin 0.0 - 1.2 mg/dL - - 0.3  Alkaline Phos 39 - 117 IU/L - - 73  AST 0 - 40 IU/L - - 14  ALT 0 - 32 IU/L - - 12    Lipid Panel     Component Value Date/Time   CHOL 74 (L) 10/15/2017 1100   TRIG 86 10/15/2017 1100   HDL 33 (L) 10/15/2017 1100   CHOLHDL 2.2 10/15/2017 1100   CHOLHDL 2.7 10/08/2015 0541   VLDL 12 10/08/2015 0541   LDLCALC 24 10/15/2017 1100    CBC    Component Value Date/Time   WBC 8.2 04/21/2017 1545   RBC 4.78 04/21/2017 1545   HGB 12.0 04/21/2017 1545   HGB 11.5 09/10/2016 1109   HCT 39.0 04/21/2017 1545   HCT 36.4 09/10/2016 1109   PLT 341 04/21/2017 1545   PLT 341 09/10/2016 1109   MCV 81.6 04/21/2017 1545   MCV 77 (L) 09/10/2016 1109   MCH 25.1 (L) 04/21/2017 1545   MCHC 30.8 04/21/2017 1545   RDW 14.5 04/21/2017 1545   RDW 15.5 (H) 09/10/2016 1109   LYMPHSABS 3.0 09/10/2016 1109   MONOABS 532 02/20/2016 0953   EOSABS 0.2 09/10/2016 1109   BASOSABS 0.0 09/10/2016 1109    Lab Results  Component Value Date   HGBA1C 7.9 (A) 10/20/2018    Assessment & Plan:   1. Type 2 diabetes mellitus without complication, without long-term current use of insulin (HCC) Uncontrolled with A1c of 7.9 which has trended up from 6.7 previously She attributes this to drinking lots of sodas Glipizide added to regimen Counseled on Diabetic diet, my plate method, 458 minutes of moderate intensity exercise/week Keep blood sugar logs with fasting goals of 80-120 mg/dl, random of less than 180 and in the event of sugars less than 60 mg/dl or  greater than 400 mg/dl please notify the clinic ASAP. It is recommended that you undergo annual eye exams and annual foot exams. Pneumonia vaccine is recommended. - POCT glucose (manual entry) - POCT glycosylated hemoglobin (Hb A1C) - glipiZIDE (GLUCOTROL) 5 MG tablet; Take 0.5 tablets (2.5 mg total) by mouth 2 (two) times daily before a meal.  Dispense: 30 tablet; Refill: 3 - CMP14+EGFR; Future - Lipid panel; Future - Microalbumin / creatinine urine ratio; Future - glucose blood (ACCU-CHEK AVIVA) test strip; Use daily before breakfast  Dispense: 100 each; Refill: 12 - Blood Glucose Monitoring Suppl (ACCU-CHEK AVIVA) device; Use as instructed daily.  Dispense: 1 each; Refill: 0 - metFORMIN (GLUCOPHAGE XR) 500 MG 24 hr tablet; Take 2 tablets (1,000 mg total) by mouth 2 (two) times daily.  Dispense: 120 tablet; Refill: 6  2. Benign essential HTN Controlled Counseled on blood pressure goal of less than 130/80, low-sodium, DASH diet, medication compliance, 150 minutes of moderate intensity exercise per week. Discussed medication compliance, adverse effects. - metoprolol tartrate (LOPRESSOR) 100 MG tablet; Take 1 tablet (100 mg total) by mouth 2 (two) times daily.  Dispense: 60 tablet; Refill: 6 - lisinopril (ZESTRIL) 5 MG tablet; Take 1 tablet (5 mg total) by mouth daily.  Dispense: 30 tablet; Refill: 6  3. Nontraumatic cortical hemorrhage of cerebral hemisphere, unspecified laterality (HCC) Risk factor modification - atorvastatin (LIPITOR) 20 MG tablet; Take 1 tablet (20 mg total) by mouth daily.  Dispense: 30 tablet; Refill: 6  4. Hyperlipidemia, unspecified hyperlipidemia type Controlled Counseled on low-cholesterol diet - atorvastatin (LIPITOR) 20 MG tablet; Take 1 tablet (20 mg total) by mouth daily.  Dispense: 30 tablet; Refill: 6  5. Other headache syndrome Advised to stay hydrated,  compliant with CPAP machine  6. Morbid obesity due to excess calories ([place]) We have discussed home  exercises as walking is not an option for her given she is high risk for falls and has no one to walk with   Health Care Maintenance: Referred for colonoscopy previously and she is awaiting an appointment from GI.  Due for eye exam and advised to make appointment with ophthalmologist.  Declines Pneumovax Meds ordered this encounter  Medications   glipiZIDE (GLUCOTROL) 5 MG tablet    Sig: Take 0.5 tablets (2.5 mg total) by mouth 2 (two) times daily before a meal.    Dispense:  30 tablet    Refill:  3   glucose blood (ACCU-CHEK AVIVA) test strip    Sig: Use daily before breakfast    Dispense:  100 each    Refill:  12   Blood Glucose Monitoring Suppl (ACCU-CHEK AVIVA) device    Sig: Use as instructed daily.    Dispense:  1 each    Refill:  0   metoprolol tartrate (LOPRESSOR) 100 MG tablet    Sig: Take 1 tablet (100 mg total) by mouth 2 (two) times daily.    Dispense:  60 tablet    Refill:  6   fluticasone (FLONASE) 50 MCG/ACT nasal spray    Sig: Place 1 spray into both nostrils daily as needed for allergies or rhinitis.    Dispense:  16 g    Refill:  1   metFORMIN (GLUCOPHAGE XR) 500 MG 24 hr tablet    Sig: Take 2 tablets (1,000 mg total) by mouth 2 (two) times daily.    Dispense:  120 tablet    Refill:  6    Discontinue previous odse   atorvastatin (LIPITOR) 20 MG tablet    Sig: Take 1 tablet (20 mg total) by mouth daily.    Dispense:  30 tablet    Refill:  6   lisinopril (ZESTRIL) 5 MG tablet    Sig: Take 1 tablet (5 mg total) by mouth daily.    Dispense:  30 tablet    Refill:  6    Follow-up: Return in about 3 months (around 01/20/2019) for medical conditions.       Charlott Rakes, MD, FAAFP. [facility] [place], [place]   10/20/2018, 4:07 PM

## 2018-10-21 ENCOUNTER — Ambulatory Visit (INDEPENDENT_AMBULATORY_CARE_PROVIDER_SITE_OTHER): Payer: Medicare Other | Admitting: Family Medicine

## 2018-10-21 ENCOUNTER — Encounter: Payer: Self-pay | Admitting: Family Medicine

## 2018-10-21 VITALS — BP 174/96 | HR 76 | Temp 98.2°F | Ht <= 58 in | Wt 298.2 lb

## 2018-10-21 DIAGNOSIS — G40909 Epilepsy, unspecified, not intractable, without status epilepticus: Secondary | ICD-10-CM | POA: Diagnosis not present

## 2018-10-21 DIAGNOSIS — G4733 Obstructive sleep apnea (adult) (pediatric): Secondary | ICD-10-CM

## 2018-10-21 DIAGNOSIS — I611 Nontraumatic intracerebral hemorrhage in hemisphere, cortical: Secondary | ICD-10-CM | POA: Diagnosis not present

## 2018-10-21 NOTE — Progress Notes (Addendum)
PATIENT: [name] DOB: 07/16/1960  REASON FOR VISIT: follow up HISTORY FROM: patient  Chief Complaint  Patient presents with  . Follow-up    6 mon f/u. Husband present. Rm 9. No new concerns at this time.      HISTORY OF PRESENT ILLNESS: Today 10/21/18 [name] is a 58 y.o. female here today for follow up of OSA on CPAP. She is also seen for history of ICH and seizure disorder. Last seen by Hoyle Sauer in 03/2018 with noted suboptimal compliance of CPAP therapy. She continues to have chronic sinus issues that she states prevents her form using CPAP. She is working with PCP for management. She is having increased headaches that she feels may be related. She was unaware of the link between headaches and sleep apnea. She has not had new supplies for CPAP in over a year. She has been cleaning the mask she has and reusing.   Compliance report dated 09/20/2018 through 10/19/2018 reveals that she has used CPAP therapy 14 out of the last 30 days for compliance of 47%.  7 of those days she used CPAP therapy for greater than 4 hours for compliance of 23%.  Average usage was 2 hours and 3 minutes.  AHI remains elevated at 13.1 on 12 cm of water.  There is a significant leak noted in the 95th percentile of 47.1.  She continues close follow up with PCP for chronic disease management. She continues BP, DMT2 and HLD medications as prescribed. She is taking levetiracetam '750mg'$  twice daily for seizure management. No seizure activity noted.   HISTORY: (copied from [employer] note on 04/20/2018)  [name] is a 58 y.o. female with history of morbid obesity and hypertension admitted on 10/06/15 for LOC and GTC seizure. CT showed right temporal small ICH, and repeat CT stable hematoma. CTA and CTV head without AVM or CVT. CUS, DVT screening and TTE unremarkable. LTM EEG no seizure. LD 58 and A1C 5.7. She was put on keppra '750mg'$  bid. She was discharged to CIR with planning outpt sleep study.    11/29/15 follow up Dr. Erlinda Hong- the patient has been doing well.No seizure activity. She is not driving so far. However, she complains of right shoulder pain with limited ROM at right UE. Walks with cane at home. BP today 137/78 and also stable at home. BP controlled well with 2 BP meds.  Interval HistoryXU During the interval time, pt has been doing well. Had MRI and MRA in 11/2015 showed resolved bleeding and no AVM or aneurysm. He followed with [name] for right shoulder pain and was put on Voltaren gel which helped. She continued on keppra and no more seizures. She complains of turning head back from right lateral position fast causing dizzy, but it turning slow then no dizziness. Currently sleep study on hold due to no insurance. Still has outpt PT. BP today 132/78.  UPDATE 07/11/2018[name], 58 year old female returns for follow-upwith a history of right temporal small ICH. Repeat CT stable and restarted on aspirin at her last visit.she has not had further stroke or TIA symptoms. She has minimal bruising or bleeding. She remains on Keppra for seizure disorder with no further seizure activity. She is back to driving without difficulty. She remains on Lipitor without complaints of myalgias. She is morbidly obese and needs a sleep study however insurance will not pay for this.She continues to see [name] for her right shoulder pain. She states that she  is doing her home exercise program She returns for reevaluation without further neurologic symptoms  UPDATE 1/9/2019[name], 58 year old female returns for follow-up degree of right temporal small ICH.  She is back on aspirin without further stroke or TIA symptoms.  No bleeding and no bruising.  She is also on Keppra for seizure disorder without side effects or recurrent seizures.  She remains on Lipitor stroke prevention without myalgias.  She is morbidly obese.  She now has Medicaid and can get her sleep study.  Has a lot of daytime  drowsiness.She complains of intermittent headache.  She takes Tylenol.  She continues to see [name] for right shoulder pain.  She uses Voltaren gel and Robaxin with fairly good pain relief.  She ambulates with a single-point cane she returns for reevaluation  Today, 10/08/2017:SA I reviewed her CPAP compliance data from 09/07/2017 through 10/06/2017 which is a total of 30 days, during which time she used her CPAP every night with percent used days greater than 4 hours at 83%, indicating very good compliance with an average usage of 5 hours and 42 minutes, residual AHI suboptimal at 10.4 per hour, leak on the high side with the 95thpercentile at 28.1 L/m on a pressure of 12 cm with EPR of 3. She reports she has had some recurrent headaches. She tried to get a different mask today but the respiratory therapist was not at the DME office today. She reports that she has noted some benefit including better sleep consolidation, better sleep quality, less nocturia but she is using a fullface mask and has noticed a leak which blows cold air into her face and she wakes up feeling very cold. She also tries to adjust the head gear and has had some pressure headaches from adjusting it too tightly. She now has a mask fit appointment on Monday. She is motivated to continue with treatment and also interested in working harder on weight loss. She has met with a nutritionist in the past.  UPDATE 1/27/2020[name], 58 year old female returns for follow-up with history of stroke right temporal small ICH.  She is back on aspirin without further stroke or TIA symptoms no bleeding and no bruising.  She is also on Keppra for seizure disorder without any recurrent seizures.  She remains on Lipitor without myalgias she is morbidly obese sleep study was positive for obstructive sleep apnea.  She continues to see [name] for right shoulder pain and mild right hemiparesis.  She had an ER admission back in October for acute  maxillary sinusitis she was placed on antibiotics and was told not to restart her CPAP until her sinus infection cleared she never has restarted her CPAP when last seen by Dr. Rexene Alberts in July she was 83% compliant at that time.  Compliance data dated 12/25/2017 to 01/23/2018 shows compliance greater than 4 hours at 60% less than 4 hours at 20% for time compliance of 80%.  Average usage 4 hours 47 minutes set pressure 12 cm AHI 3 ESS 11.  She stopped her CPAP shortly after this reading due to her sinus infection and never restarted.  She returns for reevaluation   REVIEW OF SYSTEMS: Out of a complete 14 system review of symptoms, the patient complains only of the following symptoms, headaches and all other reviewed systems are negative.  ALLERGIES: Allergies  Allergen Reactions  . Bee Venom Swelling    Facial swelling  . Eggs Or Egg-Derived Products Nausea And Vomiting and Swelling  Mouth swelled  . Gabapentin     Jittery feeling, dyspnea, crawling sensation  . Gadolinium Derivatives Itching    Immediately after gad injection pt. complained of tongue numbness and lower lip/ then had rt side of body tingling/ pt. Was assessed by Dr. Jeralyn Ruths before leaving/ no medications were administered/    HOME MEDICATIONS: Outpatient Medications Prior to Visit  Medication Sig Dispense Refill  . acetaminophen (TYLENOL) 500 MG tablet Take 500 mg by mouth every 6 (six) hours as needed for headache (pain).    Marland Kitchen atorvastatin (LIPITOR) 20 MG tablet Take 1 tablet (20 mg total) by mouth daily. 30 tablet 6  . Blood Glucose Monitoring Suppl (ACCU-CHEK AVIVA) device Use as instructed daily. 1 each 0  . cetirizine (ZYRTEC) 10 MG tablet Take 10 mg by mouth daily as needed (allergic reaction).    Marland Kitchen diclofenac sodium (VOLTAREN) 1 % GEL Apply 2 g topically 4 (four) times daily. 1 Tube 3  . diphenhydrAMINE (BENADRYL) 25 MG tablet Take 25 mg by mouth daily as needed for allergies (bee stings).    . DULoxetine (CYMBALTA) 60  MG capsule TAKE 1 CAPSULE DAILY BY MOUTH. 30 capsule 2  . fluticasone (FLONASE) 50 MCG/ACT nasal spray Place 1 spray into both nostrils daily as needed for allergies or rhinitis. 16 g 1  . glipiZIDE (GLUCOTROL) 5 MG tablet Take 0.5 tablets (2.5 mg total) by mouth 2 (two) times daily before a meal. 30 tablet 3  . glucose blood (ACCU-CHEK AVIVA) test strip Use daily before breakfast 100 each 12  . hydroxypropyl methylcellulose / hypromellose (ISOPTO TEARS / GONIOVISC) 2.5 % ophthalmic solution Place 2 drops into both eyes as needed (irritation). 15 mL 1  . levETIRAcetam (KEPPRA) 500 MG tablet TAKE 1 & 1/2 TABLETS BY MOUTH TWICE DAILY. 90 tablet 8  . lidocaine (LIDODERM) 5 % Place 1 patch onto the skin daily. Remove & Discard patch within 12 hours or as directed by MD 30 patch 0  . lisinopril (ZESTRIL) 5 MG tablet Take 1 tablet (5 mg total) by mouth daily. 30 tablet 6  . meclizine (ANTIVERT) 25 MG tablet TAKE 1 TABLET (25 MG TOTAL) BY MOUTH 2 (TWO) TIMES DAILY AS NEEDED FOR DIZZINESS. 60 tablet 1  . metFORMIN (GLUCOPHAGE XR) 500 MG 24 hr tablet Take 2 tablets (1,000 mg total) by mouth 2 (two) times daily. 120 tablet 6  . methocarbamol (ROBAXIN) 750 MG tablet TAKE 1 TABLET BY MOUTH 2 TIMES DAILY AS NEEDED FOR MUSCLE SPASMS. 60 tablet 1  . metoprolol tartrate (LOPRESSOR) 100 MG tablet Take 1 tablet (100 mg total) by mouth 2 (two) times daily. 60 tablet 6  . Na Sulfate-K Sulfate-Mg Sulf 17.5-3.13-1.6 GM/177ML SOLN 1 suprep kit for colonoscopy 354 mL 0   No facility-administered medications prior to visit.     PAST MEDICAL HISTORY: Past Medical History:  Diagnosis Date  . Headache   . Hypertension   . Iron deficiency anemia   . Seizures ([place])   . Stroke ([place])   . Type 2 diabetes mellitus ([place])     PAST SURGICAL HISTORY: Past Surgical History:  Procedure Laterality Date  . CARDIAC CATHETERIZATION  04/2010   Dr. Terrence Dupont    FAMILY HISTORY: Family History  Problem Relation Age of Onset  .  Diabetes Father   . Hypertension Father   . Hypertension Sister   . Stroke Brother     SOCIAL HISTORY: Social History   Socioeconomic History  . Marital status: Married  Spouse name: Not on file  . Number of children: Not on file  . Years of education: Not on file  . Highest education level: Not on file  Occupational History  . Not on file  Social Needs  . Financial resource strain: Not on file  . Food insecurity    Worry: Not on file    Inability: Not on file  . Transportation needs    Medical: Not on file    Non-medical: Not on file  Tobacco Use  . Smoking status: Never Smoker  . Smokeless tobacco: Never Used  Substance and Sexual Activity  . Alcohol use: No  . Drug use: No  . Sexual activity: Not Currently  Lifestyle  . Physical activity    Days per week: Not on file    Minutes per session: Not on file  . Stress: Not on file  Relationships  . Social Herbalist on phone: Not on file    Gets together: Not on file    Attends religious service: Not on file    Active member of club or organization: Not on file    Attends meetings of clubs or organizations: Not on file    Relationship status: Not on file  . Intimate partner violence    Fear of current or ex partner: Not on file    Emotionally abused: Not on file    Physically abused: Not on file    Forced sexual activity: Not on file  Other Topics Concern  . Not on file  Social History Narrative  . Not on file      PHYSICAL EXAM  Vitals:   10/21/18 1124  BP: (!) 174/96  Pulse: 76  Temp: 98.2 F (36.8 C)  TempSrc: Oral  Weight: 298 lb 3.2 oz (135.3 kg)  Height: _0  (1.473 m)   Body mass index is 62.32 kg/m.  Generalized: Well developed, in no acute distress  Neurological examination  Mentation: Alert oriented to time, place, history taking. Follows all commands speech and language fluent Cranial nerve II-XII: Pupils were equal round reactive to light. Extraocular movements were  full, visual field were full on confrontational test.  Motor: The motor testing reveals 5 over 5 strength of all 4 extremities. Good symmetric motor tone is noted throughout.   DIAGNOSTIC DATA (LABS, IMAGING, TESTING) - I reviewed patient records, labs, notes, testing and imaging myself where available.  No flowsheet data found.   Lab Results  Component Value Date   WBC 8.2 04/21/2017   HGB 12.0 04/21/2017   HCT 39.0 04/21/2017   MCV 81.6 04/21/2017   PLT 341 04/21/2017      Component Value Date/Time   NA 145 (H) 03/23/2018 1622   K 4.1 03/23/2018 1622   CL 104 03/23/2018 1622   CO2 23 03/23/2018 1622   GLUCOSE 100 (H) 03/23/2018 1622   GLUCOSE 134 (H) 02/24/2018 1457   BUN 13 03/23/2018 1622   CREATININE 1.02 (H) 03/23/2018 1622   CREATININE 0.86 02/20/2016 0953   CALCIUM 9.3 03/23/2018 1622   PROT 7.7 10/15/2017 1100   ALBUMIN 3.6 10/15/2017 1100   AST 14 10/15/2017 1100   ALT 12 10/15/2017 1100   ALKPHOS 73 10/15/2017 1100   BILITOT 0.3 10/15/2017 1100   GFRNONAA 61 03/23/2018 1622   GFRNONAA 76 02/20/2016 0953   GFRAA 71 03/23/2018 1622   GFRAA 88 02/20/2016 0953   Lab Results  Component Value Date   CHOL 74 (L) 10/15/2017  HDL 33 (L) 10/15/2017   LDLCALC 24 10/15/2017   TRIG 86 10/15/2017   CHOLHDL 2.2 10/15/2017   Lab Results  Component Value Date   HGBA1C 7.9 (A) 10/20/2018   Lab Results  Component Value Date   VITAMINB12 297 05/18/2010   Lab Results  Component Value Date   TSH 1.99 02/24/2018     ASSESSMENT AND PLAN 58 y.o. year old female  has a past medical history of Headache, Hypertension, Iron deficiency anemia, Seizures ([place]), Stroke ([place]), and Type 2 diabetes mellitus ([place]). here with     ICD-10-CM   1. Obstructive sleep apnea  G47.33 For home use only DME continuous positive airway pressure (CPAP)  2. 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 Seizure disorder ([place])  G40.909      Compliance report shows suboptimal compliance with CPAP usage. AHI continues to be elevated as well as leak. She has not received new supplies in over a year. We will replace supplies. I have also advised that she reach out to DME for assistance with appropriate placement of mask and tightening. She was educated on risks of untreated sleep apnea. She was educated regarding link between headaches and sleep apnea. She was encouraged to use CPAP nightly and for greater than 4 hours each night. Continue Keppra as prescribed. We have also discussed BP in the office. It is imperative she manage BP at home. Yesterday she was seen by PCP and reading was 132/81. She does not elaborate but states that she had a stressful morning and feels this is related. She is adamant that BP will return to normal once she can get home. She was advised to check BP at home and call PCP immediately should it remain elevated. Stroke precautions given. Follow up in 3 months for compliance review. She verbalizes understanding and agreement with plan.    Orders Placed This Encounter  Procedures  . For home use only DME continuous positive airway pressure (CPAP)    Patient needs new supplies    Order Specific Question:   Length of Need    Answer:   Lifetime    Order Specific Question:   Patient has OSA or probable OSA    Answer:   Yes    Order Specific Question:   Is the patient currently using CPAP in the home    Answer:   Yes    Order Specific Question:   Settings    Answer:   Other see comments    Order Specific Question:   CPAP supplies needed    Answer:   Mask, headgear, cushions, filters, heated tubing and water chamber     No orders of the defined types were placed in this encounter.     I spent 15 minutes with the patient. 50% of this time was spent counseling and educating patient on plan of care and medications.    Debbora Presto, FNP-C 10/21/2018, 12:28 PM [name] [address] [place]  [place], [place] [address] [phone]   I reviewed the above note and documentation by the Nurse Practitioner and agree with the history, exam, assessment and plan as outlined above. I was immediately available for consultation. Star Age, MD, PhD [name])

## 2018-10-21 NOTE — Patient Instructions (Addendum)
DME: [facility] Phone: [phone], press option 1 Call for help with mask     Please work on CPAP compliance with goal of nightly use and for greater than 4 hours each night.   Continue Keppra 750mg  twice daily   Continue close follow up with PCP for BP, Diabetes and cholesterol management  Call PCP for elevated reading once home and settled.    Follow up in 3 months  Stroke Prevention Some medical conditions and lifestyle choices can lead to a higher risk for a stroke. You can help to prevent a stroke by making nutrition, lifestyle, and other changes. What nutrition changes can be made?   Eat healthy foods. ? Choose foods that are high in fiber. These include:  Fresh fruits.  Fresh vegetables.  Whole grains. ? Eat at least 5 or more servings of fruits and vegetables each day. Try to fill half of your plate at each meal with fruits and vegetables. ? Choose lean protein foods. These include:  Lowfat (lean) cuts of meat.  Chicken without skin.  Fish.  Tofu.  Beans.  Nuts. ? Eat low-fat dairy products. ? Avoid foods that:  Are high in salt (sodium).  Have saturated fat.  Have trans fat.  Have cholesterol.  Are processed.  Are premade.  Follow eating guidelines as told by your doctor. These may include: ? Reducing how many calories you eat and drink each day. ? Limiting how much salt you eat or drink each day to 1,500 milligrams (mg). ? Using only healthy fats for cooking. These include:  Olive oil.  Canola oil.  Sunflower oil. ? Counting how many carbohydrates you eat and drink each day. What lifestyle changes can be made?  Try to stay at a healthy weight. Talk to your doctor about what a good weight is for you.  Get at least 30 minutes of moderate physical activity at least 5 days a week. This can include: ? Fast walking. ? Biking. ? Swimming.  Do not use any products that have nicotine or tobacco. This includes cigarettes and  e-cigarettes. If you need help quitting, ask your doctor. Avoid being around tobacco smoke in general.  Limit how much alcohol you drink to no more than 1 drink a day for nonpregnant women and 2 drinks a day for men. One drink equals 12 oz of beer, 5 oz of wine, or 1 oz of hard liquor.  Do not use drugs.  Avoid taking birth control pills. Talk to your doctor about the risks of taking birth control pills if: ? You are over 63 years old. ? You smoke. ? You get migraines. ? You have had a blood clot. What other changes can be made?  Manage your cholesterol. ? It is important to eat a healthy diet. ? If your cholesterol cannot be managed through your diet, you may also need to take medicines. Take medicines as told by your doctor.  Manage your diabetes. ? It is important to eat a healthy diet and to exercise regularly. ? If your blood sugar cannot be managed through diet and exercise, you may need to take medicines. Take medicines as told by your doctor.  Control your high blood pressure (hypertension). ? Try to keep your blood pressure below 130/80. This can help lower your risk of stroke. ? It is important to eat a healthy diet and to exercise regularly. ? If your blood pressure cannot be managed through diet and exercise, you may need to take medicines. Take  medicines as told by your doctor. ? Ask your doctor if you should check your blood pressure at home. ? Have your blood pressure checked every year. Do this even if your blood pressure is normal.  Talk to your doctor about getting checked for a sleep disorder. Signs of this can include: ? Snoring a lot. ? Feeling very tired.  Take over-the-counter and prescription medicines only as told by your doctor. These may include aspirin or blood thinners (antiplatelets or anticoagulants).  Make sure that any other medical conditions you have are managed. Where to find more information  American Stroke Association:  www.strokeassociation.org  National Stroke Association: www.stroke.org Get help right away if:  You have any symptoms of stroke. "BE FAST" is an easy way to remember the main warning signs: ? B - Balance. Signs are dizziness, sudden trouble walking, or loss of balance. ? E - Eyes. Signs are trouble seeing or a sudden change in how you see. ? F - Face. Signs are sudden weakness or loss of feeling of the face, or the face or eyelid drooping on one side. ? A - Arms. Signs are weakness or loss of feeling in an arm. This happens suddenly and usually on one side of the body. ? S - Speech. Signs are sudden trouble speaking, slurred speech, or trouble understanding what people say. ? T - Time. Time to call emergency services. Write down what time symptoms started.  You have other signs of stroke, such as: ? A sudden, very bad headache with no known cause. ? Feeling sick to your stomach (nausea). ? Throwing up (vomiting). ? Jerky movements you cannot control (seizure). These symptoms may represent a serious problem that is an emergency. Do not wait to see if the symptoms will go away. Get medical help right away. Call your local emergency services (911 in the [place] Do not drive yourself to the hospital. Summary  You can prevent a stroke by eating healthy, exercising, not smoking, drinking less alcohol, and treating other health problems, such as diabetes, high blood pressure, or high cholesterol.  Do not use any products that contain nicotine or tobacco, such as cigarettes and e-cigarettes.  Get help right away if you have any signs or symptoms of a stroke. This information is not intended to replace advice given to you by your health care provider. Make sure you discuss any questions you have with your health care provider. Document Released: 09/10/2011 Document Revised: 05/07/2018 Document Reviewed: 06/12/2016 Elsevier Patient Education  [place].  Sleep Apnea Sleep apnea affects  breathing during sleep. It causes breathing to stop for a short time or to become shallow. It can also increase the risk of:  Heart attack.  Stroke.  Being very overweight (obese).  Diabetes.  Heart failure.  Irregular heartbeat. The goal of treatment is to help you breathe normally again. What are the causes? There are three kinds of sleep apnea:  Obstructive sleep apnea. This is caused by a blocked or collapsed airway.  Central sleep apnea. This happens when the brain does not send the right signals to the muscles that control breathing.  Mixed sleep apnea. This is a combination of obstructive and central sleep apnea. The most common cause of this condition is a collapsed or blocked airway. This can happen if:  Your throat muscles are too relaxed.  Your tongue and tonsils are too large.  You are overweight.  Your airway is too small. What increases the risk?  Being overweight.  Smoking.  Having a small airway.  Being older.  Being female.  Drinking alcohol.  Taking medicines to calm yourself (sedatives or tranquilizers).  Having family members with the condition. What are the signs or symptoms?  Trouble staying asleep.  Being sleepy or tired during the day.  Getting angry a lot.  Loud snoring.  Headaches in the morning.  Not being able to focus your mind (concentrate).  Forgetting things.  Less interest in sex.  Mood swings.  Personality changes.  Feelings of sadness (depression).  Waking up a lot during the night to pee (urinate).  Dry mouth.  Sore throat. How is this diagnosed?  Your medical history.  A physical exam.  A test that is done when you are sleeping (sleep study). The test is most often done in a sleep lab but may also be done at home. How is this treated?   Sleeping on your side.  Using a medicine to get rid of mucus in your nose (decongestant).  Avoiding the use of alcohol, medicines to help you relax, or  certain pain medicines (narcotics).  Losing weight, if needed.  Changing your diet.  Not smoking.  Using a machine to open your airway while you sleep, such as: ? An oral appliance. This is a mouthpiece that shifts your lower jaw forward. ? A CPAP device. This device blows air through a mask when you breathe out (exhale). ? An EPAP device. This has valves that you put in each nostril. ? A BPAP device. This device blows air through a mask when you breathe in (inhale) and breathe out.  Having surgery if other treatments do not work. It is important to get treatment for sleep apnea. Without treatment, it can lead to:  High blood pressure.  Coronary artery disease.  In men, not being able to have an erection (impotence).  Reduced thinking ability. Follow these instructions at home: Lifestyle  Make changes that your doctor recommends.  Eat a healthy diet.  Lose weight if needed.  Avoid alcohol, medicines to help you relax, and some pain medicines.  Do not use any products that contain nicotine or tobacco, such as cigarettes, e-cigarettes, and chewing tobacco. If you need help quitting, ask your doctor. General instructions  Take over-the-counter and prescription medicines only as told by your doctor.  If you were given a machine to use while you sleep, use it only as told by your doctor.  If you are having surgery, make sure to tell your doctor you have sleep apnea. You may need to bring your device with you.  Keep all follow-up visits as told by your doctor. This is important. Contact a doctor if:  The machine that you were given to use during sleep bothers you or does not seem to be working.  You do not get better.  You get worse. Get help right away if:  Your chest hurts.  You have trouble breathing in enough air.  You have an uncomfortable feeling in your back, arms, or stomach.  You have trouble talking.  One side of your body feels weak.  A part of  your face is hanging down. These symptoms may be an emergency. Do not wait to see if the symptoms will go away. Get medical help right away. Call your local emergency services (911 in the [place] Do not drive yourself to the hospital. Summary  This condition affects breathing during sleep.  The most common cause is a collapsed or blocked airway.  The  goal of treatment is to help you breathe normally while you sleep. This information is not intended to replace advice given to you by your health care provider. Make sure you discuss any questions you have with your health care provider. Document Released: 12/19/2007 Document Revised: 12/26/2017 Document Reviewed: 11/04/2017 Elsevier Patient Education  2020 [employer].

## 2018-10-23 ENCOUNTER — Other Ambulatory Visit: Payer: Self-pay

## 2018-10-23 ENCOUNTER — Ambulatory Visit: Payer: Medicaid Other | Admitting: Adult Health

## 2018-10-23 ENCOUNTER — Ambulatory Visit: Payer: Medicare Other | Attending: Family Medicine

## 2018-10-23 DIAGNOSIS — E119 Type 2 diabetes mellitus without complications: Secondary | ICD-10-CM

## 2018-10-24 LAB — CMP14+EGFR
ALT: 16 IU/L (ref 0–32)
AST: 18 IU/L (ref 0–40)
Albumin/Globulin Ratio: 0.9 — ABNORMAL LOW (ref 1.2–2.2)
Albumin: 3.7 g/dL — ABNORMAL LOW (ref 3.8–4.9)
Alkaline Phosphatase: 81 IU/L (ref 39–117)
BUN/Creatinine Ratio: 15 (ref 9–23)
BUN: 14 mg/dL (ref 6–24)
Bilirubin Total: 0.4 mg/dL (ref 0.0–1.2)
CO2: 26 mmol/L (ref 20–29)
Calcium: 9.4 mg/dL (ref 8.7–10.2)
Chloride: 100 mmol/L (ref 96–106)
Creatinine, Ser: 0.95 mg/dL (ref 0.57–1.00)
GFR calc Af Amer: 77 mL/min/{1.73_m2} (ref >59)
GFR calc non Af Amer: 67 mL/min/{1.73_m2} (ref >59)
Globulin, Total: 4.3 g/dL (ref 1.5–4.5)
Glucose: 158 mg/dL — ABNORMAL HIGH (ref 65–99)
Potassium: 3.8 mmol/L (ref 3.5–5.2)
Sodium: 143 mmol/L (ref 134–144)
Total Protein: 8 g/dL (ref 6.0–8.5)

## 2018-10-24 LAB — LIPID PANEL
Chol/HDL Ratio: 2.1 ratio (ref 0.0–4.4)
Cholesterol, Total: 74 mg/dL — ABNORMAL LOW (ref 100–199)
HDL: 36 mg/dL — ABNORMAL LOW (ref >39)
LDL Calculated: 24 mg/dL (ref 0–99)
Triglycerides: 72 mg/dL (ref 0–149)
VLDL Cholesterol Cal: 14 mg/dL (ref 5–40)

## 2018-10-24 LAB — MICROALBUMIN / CREATININE URINE RATIO
Creatinine, Urine: 301.9 mg/dL
Microalb/Creat Ratio: 77 mg/g creat — ABNORMAL HIGH (ref 0–29)
Microalbumin, Urine: 233 ug/mL

## 2018-10-26 ENCOUNTER — Telehealth (INDEPENDENT_AMBULATORY_CARE_PROVIDER_SITE_OTHER): Payer: Self-pay

## 2018-10-26 NOTE — Telephone Encounter (Signed)
-----   Message from Charlott Rakes, MD sent at 10/24/2018  1:04 PM EDT ----- Cholesterol is normal, other labs are stable

## 2018-10-26 NOTE — Telephone Encounter (Signed)
Patient name and DOB has been verified Patient was informed of lab results. Patient had no questions.  

## 2018-10-28 ENCOUNTER — Other Ambulatory Visit: Payer: Self-pay | Admitting: Internal Medicine

## 2018-10-28 ENCOUNTER — Telehealth: Payer: Self-pay

## 2018-10-28 DIAGNOSIS — I10 Essential (primary) hypertension: Secondary | ICD-10-CM

## 2018-10-28 MED ORDER — LISINOPRIL 5 MG PO TABS: 7.5000 mg | ORAL_TABLET | Freq: Every day | ORAL | 6 refills | Status: DC

## 2018-10-28 NOTE — Telephone Encounter (Signed)
Patient name and DOB has been verified Patient was informed of lab results. Patient had no questions.  

## 2018-10-28 NOTE — Telephone Encounter (Signed)
-----   Message from Ladell Pier, MD sent at 10/28/2018  1:35 PM EDT ----- Let pt know that she has increase protein in the urine which can be an indication of DM affecting the kidneys.  Lisinopril helps to protect the kidneys.  I recommend increase Lisinopril from 5 mg daily to 1.5 tabs daily.  New rxn sent to her pharmacy.

## 2018-11-13 MED FILL — DULoxetine HCL 60 MG CPEP: 60 | 30 days supply | Qty: 30 | Fill #2

## 2018-11-19 MED FILL — LISINOPRIL 5 MG TAB: 5 | 90 days supply | Qty: 90 | Fill #0

## 2018-11-27 ENCOUNTER — Other Ambulatory Visit: Payer: Self-pay

## 2018-11-27 ENCOUNTER — Encounter (HOSPITAL_COMMUNITY): Payer: Self-pay

## 2018-11-27 ENCOUNTER — Ambulatory Visit (INDEPENDENT_AMBULATORY_CARE_PROVIDER_SITE_OTHER): Payer: Medicare Other

## 2018-11-27 ENCOUNTER — Ambulatory Visit (HOSPITAL_COMMUNITY)
Admission: EM | Admit: 2018-11-27 | Discharge: 2018-11-27 | Disposition: A | Discharge status: Home / Self Care | Payer: Medicare Other | Attending: Family Medicine | Admitting: Family Medicine

## 2018-11-27 DIAGNOSIS — M79671 Pain in right foot: Secondary | ICD-10-CM

## 2018-11-27 HISTORY — DX: Obesity, unspecified: E66.9

## 2018-11-27 MED ORDER — PREDNISONE 10 MG (21) PO TBPK: ORAL_TABLET | ORAL | 0 refills | Status: DC

## 2018-11-27 MED FILL — predniSONE 10 MG TABS: 10 | 5 days supply | Qty: 21 | Fill #0

## 2018-11-27 NOTE — ED Provider Notes (Signed)
[facility]    CSN: [id number] Arrival date & time: 11/27/18  2952      History   Chief Complaint Chief Complaint  Patient presents with  . Foot Pain    Right    HPI [name] is a 58 y.o. female.   Patient is a 58 year old female with past medical history headache, hypertension, anemia, obesity, seizures, stroke, type 2 diabetes.  She presents today with approximately 1 day of right foot pain and swelling.  No known injuries.  Pain located to the entire foot but mostly to the lateral malleolus and top of the foot.  There is some erythema.  No known fevers.  Denies any history of gout.  No numbness, tingling or weakness.  Unable to ambulate due to the pain.  ROS per HPI      Past Medical History:  Diagnosis Date  . Headache   . Hypertension   . Iron deficiency anemia   . Obesity   . Seizures ([place])   . Stroke ([place])   . Type 2 diabetes mellitus [facility])     Patient Active Problem List   Diagnosis Date Noted  . Empty sella ([place]) 02/24/2018  . Morbid obesity due to excess calories ([place]) 02/05/2018  . 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 History of stroke 07/16/2017  . Headache 04/02/2017  . Seizure disorder ([place]) 10/02/2016  . Obstructive sleep apnea 11/29/2015  . Right shoulder pain 11/29/2015  . Adjustment disorder with depressed mood 10/19/2015  . Nocturnal enuresis   . Hypokalemia   . Anemia, iron deficiency   . Super obese   . Type 2 diabetes mellitus ([place])   . Orthostatic hypotension   . Supplemental oxygen dependent   . Diastolic dysfunction   . Tachypnea   . AKI (acute kidney injury) ([place])   . Acute blood loss anemia   . Lethargy   . Wheezing   . Benign essential HTN   . ICH (intracerebral hemorrhage) ([place]) 10/06/2015    Past Surgical History:  Procedure Laterality Date  . CARDIAC CATHETERIZATION  04/2010   Dr. Terrence Dupont    OB History   No obstetric history on file.      Home Medications     Prior to Admission medications   Medication Sig Start Date End Date Taking? Authorizing Provider  acetaminophen (TYLENOL) 500 MG tablet Take 500 mg by mouth every 6 (six) hours as needed for headache (pain).    [provider]  atorvastatin (LIPITOR) 20 MG tablet Take 1 tablet (20 mg total) by mouth daily. 10/20/18   Charlott Rakes, MD  Blood Glucose Monitoring Suppl (ACCU-CHEK AVIVA) device Use as instructed daily. 10/20/18   Charlott Rakes, MD  cetirizine (ZYRTEC) 10 MG tablet Take 10 mg by mouth daily as needed (allergic reaction).    [provider]  diclofenac sodium (VOLTAREN) 1 % GEL Apply 2 g topically 4 (four) times daily. 03/09/18   Charlott Rakes, MD  diphenhydrAMINE (BENADRYL) 25 MG tablet Take 25 mg by mouth daily as needed for allergies (bee stings).    [provider]  DULoxetine (CYMBALTA) 60 MG capsule TAKE 1 CAPSULE DAILY BY MOUTH. 07/24/18   Patel, Domenick Bookbinder, MD  fluticasone [facility]) 50 MCG/ACT nasal spray Place 1 spray into both nostrils daily as needed for allergies or rhinitis. 10/20/18   Charlott Rakes, MD  glipiZIDE (GLUCOTROL) 5 MG tablet Take 0.5 tablets (2.5 mg total) by mouth 2 (two) times daily before a  meal. 10/20/18   Charlott Rakes, MD  glucose blood (ACCU-CHEK AVIVA) test strip Use daily before breakfast 10/20/18   Charlott Rakes, MD  hydroxypropyl methylcellulose / hypromellose (ISOPTO TEARS / GONIOVISC) 2.5 % ophthalmic solution Place 2 drops into both eyes as needed (irritation). 05/23/17   Charlott Rakes, MD  levETIRAcetam (KEPPRA) 500 MG tablet TAKE 1 & 1/2 TABLETS BY MOUTH TWICE DAILY. 04/17/18   Dennie Bible, NP  lidocaine (LIDODERM) 5 % Place 1 patch onto the skin daily. Remove & Discard patch within 12 hours or as directed by MD 03/01/18   Maczis, Barth Kirks, PA-C  lisinopril (ZESTRIL) 5 MG tablet Take 1.5 tablets (7.5 mg total) by mouth daily. 10/28/18   Ladell Pier, MD  meclizine (ANTIVERT) 25 MG tablet TAKE 1  TABLET (25 MG TOTAL) BY MOUTH 2 (TWO) TIMES DAILY AS NEEDED FOR DIZZINESS. 07/07/18   Ladell Pier, MD  metFORMIN (GLUCOPHAGE XR) 500 MG 24 hr tablet Take 2 tablets (1,000 mg total) by mouth 2 (two) times daily. 10/20/18   Charlott Rakes, MD  methocarbamol (ROBAXIN) 750 MG tablet TAKE 1 TABLET BY MOUTH 2 TIMES DAILY AS NEEDED FOR MUSCLE SPASMS. 02/27/18   Jamse Arn, MD  metoprolol tartrate (LOPRESSOR) 100 MG tablet Take 1 tablet (100 mg total) by mouth 2 (two) times daily. 10/20/18   Charlott Rakes, MD  Na Sulfate-K Sulfate-Mg Sulf 17.5-3.13-1.6 GM/177ML SOLN 1 suprep kit for colonoscopy 05/04/18   Nandigam, Venia Minks, MD  predniSONE (STERAPRED UNI-PAK 21 TAB) 10 MG (21) TBPK tablet 6 tabs for 1 day, then 5 tabs for 1 das, then 4 tabs for 1 day, then 3 tabs for 1 day, 2 tabs for 1 day, then 1 tab for 1 day 11/27/18   Orvan July, NP    Family History Family History  Problem Relation Age of Onset  . Diabetes Father   . Hypertension Father   . Hypertension Sister   . Stroke Brother     Social History Social History   Tobacco Use  . Smoking status: Never Smoker  . Smokeless tobacco: Never Used  Substance Use Topics  . Alcohol use: No  . Drug use: No     Allergies   Bee venom, Eggs or egg-derived products, Gabapentin, and Gadolinium derivatives   Review of Systems Review of Systems   Physical Exam Triage Vital Signs ED Triage Vitals  Enc Vitals Group     BP 11/27/18 0950 (!) 168/89     Pulse Rate 11/27/18 0950 81     Resp 11/27/18 0950 19     Temp 11/27/18 0950 98.5 F (36.9 C)     Temp Source 11/27/18 0950 Oral     SpO2 11/27/18 0950 99 %     Weight --      Height --      Head Circumference --      Peak Flow --      Pain Score 11/27/18 0948 10     Pain Loc --      Pain Edu? --      Excl. in [place]? --    No data found.  Updated Vital Signs BP (!) 168/89 (BP Location: Left Wrist)   Pulse 81   Temp 98.5 F (36.9 C) (Oral)   Resp 19   LMP 02/17/2011  (Exact Date)   SpO2 99%   Visual Acuity Right Eye Distance:   Left Eye Distance:   Bilateral Distance:    Right Eye Near:  Left Eye Near:    Bilateral Near:     Physical Exam Vitals signs and nursing note reviewed.  Constitutional:      General: She is not in acute distress.    Appearance: Normal appearance. She is obese. She is not ill-appearing, toxic-appearing or diaphoretic.  HENT:     Head: Normocephalic and atraumatic.     Nose: Nose normal.  Eyes:     Conjunctiva/sclera: Conjunctivae normal.  Neck:     Musculoskeletal: Normal range of motion.  Pulmonary:     Effort: Pulmonary effort is normal.  Musculoskeletal:        General: Swelling and tenderness present.     Right foot: Tenderness and swelling present.       Feet:  Skin:    General: Skin is warm and dry.     Findings: No rash.  Neurological:     Mental Status: She is alert.  Psychiatric:        Mood and Affect: Mood normal.      UC Treatments / Results  Labs (all labs ordered are listed, but only abnormal results are displayed) Labs Reviewed - No data to display  EKG   Radiology Dg Ankle Complete Right  Result Date: 11/27/2018 CLINICAL DATA:  Acute right ankle pain and swelling without known injury. EXAM: RIGHT ANKLE - COMPLETE 3+ VIEW COMPARISON:  None. FINDINGS: There is no evidence of fracture, dislocation, or joint effusion. There is no evidence of arthropathy. Large lobular calcification is seen anterior to talotibial joint on lateral projection, which may represent loose body or bursal or soft tissue calcification. No other soft tissue abnormality is noted. IMPRESSION: Large lobular calcification is seen anterior to talotibial joint on lateral projection, which may represent loose body or bursal calcification, or some other form soft tissue calcification. No fracture or dislocation is noted in the right ankle. Electronically Signed   By: Marijo Conception M.D.   On: 11/27/2018 11:19   Dg Foot  Complete Right  Result Date: 11/27/2018 CLINICAL DATA:  Acute right foot pain and swelling without known injury. EXAM: RIGHT FOOT COMPLETE - 3+ VIEW COMPARISON:  None. FINDINGS: There is no evidence of fracture or dislocation. There is no evidence of arthropathy. Mild posterior calcaneal spurring is noted. Large lobular calcification is seen anterior to the talotibial joint concerning for possible loose body, bursal or soft tissue calcification. IMPRESSION: No acute abnormality seen in the right foot. Electronically Signed   By: Marijo Conception M.D.   On: 11/27/2018 11:18    Procedures Procedures (including critical care time)  Medications Ordered in UC Medications - No data to display  Initial Impression / Assessment and Plan / UC Course  I have reviewed the triage vital signs and the nursing notes.  Pertinent labs & imaging results that were available during my care of the patient were reviewed by me and considered in my medical decision making (see chart for details).     Right foot pain, ankle pain- bursitis vs gout vs calcium build up No acute fracture on x ray.  Treating with prednisone Told to monitor blood sugars.  RICE Follow up as needed for continued or worsening symptoms  Final Clinical Impressions(s) / UC Diagnoses   Final diagnoses:  Foot pain, right     Discharge Instructions     No fractures or dislocations.  Most likely some sort of bursitis or calcification buildup in the foot and ankle.  This is also causing the inflammation, swelling  and pain. We will treat this with prednisone taper over the next 6 days.  Make sure you take this with food. Rest, ice, elevate the foot For any continued or worsening problems you need to follow-up with orthopedics.    ED Prescriptions    Medication Sig Dispense Auth. Provider   predniSONE (STERAPRED UNI-PAK 21 TAB) 10 MG (21) TBPK tablet 6 tabs for 1 day, then 5 tabs for 1 das, then 4 tabs for 1 day, then 3 tabs for 1 day,  2 tabs for 1 day, then 1 tab for 1 day 21 tablet Rozanna Box, Kloie Whiting A, NP     Controlled Substance Prescriptions [place] Controlled Substance Registry consulted? Not Applicable   Orvan July, NP 11/27/18 1140

## 2018-11-27 NOTE — Discharge Instructions (Addendum)
No fractures or dislocations.  Most likely some sort of bursitis or calcification buildup in the foot and ankle.  This is also causing the inflammation, swelling and pain. We will treat this with prednisone taper over the next 6 days.  Make sure you take this with food. Rest, ice, elevate the foot For any continued or worsening problems you need to follow-up with orthopedics.

## 2018-11-27 NOTE — ED Triage Notes (Signed)
Patient presents to Urgent Care with complaints of right foot pain since yesterday. Patient reports unknown injury, states the pain starts in the heel and radiates to the middle of the foot and occasionally up the back of her leg.

## 2018-12-07 ENCOUNTER — Ambulatory Visit: Payer: Self-pay | Admitting: Physical Medicine & Rehabilitation

## 2018-12-08 ENCOUNTER — Encounter: Payer: Medicare Other | Attending: Physical Medicine & Rehabilitation | Admitting: Physical Medicine & Rehabilitation

## 2018-12-08 ENCOUNTER — Other Ambulatory Visit: Payer: Self-pay

## 2018-12-08 ENCOUNTER — Encounter: Payer: Self-pay | Admitting: Physical Medicine & Rehabilitation

## 2018-12-08 VITALS — BP 147/83 | HR 78 | Temp 98.2°F | Ht 59.0 in | Wt 296.0 lb

## 2018-12-08 DIAGNOSIS — M25511 Pain in right shoulder: Secondary | ICD-10-CM | POA: Insufficient documentation

## 2018-12-08 DIAGNOSIS — I69398 Other sequelae of cerebral infarction: Secondary | ICD-10-CM | POA: Insufficient documentation

## 2018-12-08 DIAGNOSIS — I611 Nontraumatic intracerebral hemorrhage in hemisphere, cortical: Secondary | ICD-10-CM | POA: Insufficient documentation

## 2018-12-08 DIAGNOSIS — R350 Frequency of micturition: Secondary | ICD-10-CM | POA: Insufficient documentation

## 2018-12-08 DIAGNOSIS — R269 Unspecified abnormalities of gait and mobility: Secondary | ICD-10-CM | POA: Diagnosis present

## 2018-12-08 DIAGNOSIS — F329 Major depressive disorder, single episode, unspecified: Secondary | ICD-10-CM

## 2018-12-08 DIAGNOSIS — G8929 Other chronic pain: Secondary | ICD-10-CM | POA: Insufficient documentation

## 2018-12-08 DIAGNOSIS — I10 Essential (primary) hypertension: Secondary | ICD-10-CM | POA: Insufficient documentation

## 2018-12-08 DIAGNOSIS — E236 Other disorders of pituitary gland: Secondary | ICD-10-CM | POA: Insufficient documentation

## 2018-12-08 NOTE — Progress Notes (Signed)
Subjective:    Patient ID: [name], female    DOB: 11-Apr-1960, 58 y.o.   MRN: [id number]  HPI  RH-female with history of morbid obesity, HTN, iron deficiency anemia who presents for follow up after right temporal intracranial hemorrhage.  Last clinic visit 06/05/2018.  Since last visit, she went to the ED for foot pain - notes reviewed, given steroids.  she still has not gone to the [facility]. She does not recall if she is taking Cymbalta. Denies seizures.  She never followed up with the dietitian. She states she lost 2 lbs since that visit. She is taking Robaxin, ~3/week. Denies falls. She is doing HEP. Should hurts intermittently. BP is slightly elevated today. She never followed up with Endo.  She continues to have urinary frequency.  She had good benefit with steroids for her foot pain.   Pain Inventory Average Pain  6 Pain Right Now 5 My pain is sharp, burning, stabbing and aching  In the last 24 hours, has pain interfered with the following? General activity 4 Relation with others 5 Enjoyment of life 5 What TIME of day is your pain at its worst? evening night Sleep (in general) Fair  Pain is worse with: some activites Pain improves with: rest and medication Relief from Meds: 7  Mobility walk with assistance use a cane ability to climb steps?  no do you drive?  yes transfers alone  Function disabled: date disabled . I need assistance with the following:  household duties and shopping  Neuro/Psych trouble walking dizziness depression  Prior Studies Any changes since last visit?  no  Physicians involved in your care Any changes since last visit?  no   Family History  Problem Relation Age of Onset  . Diabetes Father   . Hypertension Father   . Hypertension Sister   . Stroke Brother    Social History   Socioeconomic History  . Marital status: Married    Spouse name: Not on file  . Number of children: Not on file  . Years of education: Not on file  .  Highest education level: Not on file  Occupational History  . Not on file  Social Needs  . Financial resource strain: Not on file  . Food insecurity    Worry: Not on file    Inability: Not on file  . Transportation needs    Medical: Not on file    Non-medical: Not on file  Tobacco Use  . Smoking status: Never Smoker  . Smokeless tobacco: Never Used  Substance and Sexual Activity  . Alcohol use: No  . Drug use: No  . Sexual activity: Not Currently  Lifestyle  . Physical activity    Days per week: Not on file    Minutes per session: Not on file  . Stress: Not on file  Relationships  . Social Herbalist on phone: Not on file    Gets together: Not on file    Attends religious service: Not on file    Active member of club or organization: Not on file    Attends meetings of clubs or organizations: Not on file    Relationship status: Not on file  Other Topics Concern  . Not on file  Social History Narrative  . Not on file   Past Surgical History:  Procedure Laterality Date  . CARDIAC CATHETERIZATION  04/2010   Dr. Terrence Dupont   Past Medical History:  Diagnosis Date  . Headache   .  Hypertension   . Iron deficiency anemia   . Obesity   . Seizures ([place])   . Stroke ([place])   . Type 2 diabetes mellitus ([facility])    BP (!) 147/83   Pulse 78   Temp 98.2 F (36.8 C)   Ht 4\' 11"  (1.499 m)   Wt 296 lb (134.3 kg)   LMP 02/17/2011 (Exact Date)   SpO2 96%   BMI 59.78 kg/m   Opioid Risk Score:   Fall Risk Score:  `1  Depression screen PHQ 2/9  Depression screen [facility] 2/9 10/20/2018 06/05/2018 04/22/2018 04/22/2018 10/31/2017 10/15/2017 08/29/2017  Decreased Interest 0 1 2 2 1 2  0  Down, Depressed, Hopeless 0 1 2 2 1 1  0  PHQ - 2 Score 0 2 4 4 2 3  0  Altered sleeping 2 - 2 2 - 2 -  Tired, decreased energy 0 - 2 2 - 1 -  Change in appetite 2 - 2 2 - 1 -  Feeling bad or failure about yourself  0 - 2 2 - 1 -  Trouble concentrating 0 - 0 0 - 0 -  Moving slowly or  fidgety/restless 2 - 2 2 - 1 -  Suicidal thoughts 0 - 0 0 - 0 -  PHQ-9 Score 6 - 14 14 - 9 -  Difficult doing work/chores - - - - - - -  Some recent data might be hidden    Review of Systems  Constitutional: Negative.        Bladder control problems   HENT: Negative.   Eyes: Negative.   Respiratory: Positive for apnea, cough, shortness of breath and wheezing.   Cardiovascular: Positive for leg swelling.  Gastrointestinal: Positive for diarrhea.  Endocrine: Negative.   Genitourinary: Negative.   Musculoskeletal: Positive for arthralgias, back pain and gait problem.       Spasms   Skin: Negative.   Neurological: Positive for weakness.       Tingling    Hematological: Negative.   Psychiatric/Behavioral: Positive for confusion and dysphoric mood.      Objective:   Physical Exam Constitutional: She appears well-developed and well-nourished. NAD. Morbidly obese.  HENT: Normocephalic. Atraumatic.  Eyes: EOMI. No discharge.  Cardiovascular: No JVD. Respiratory: Effort normal. No stridor.  GI: Nondistended Musculoskeletal:  Gait: Slow cadence with cane No edema. +TTP AC joint. No pain with IR/ER shoulder Mild pain with right scarf test Neurological: She is alert and oriented.  Motor: RUE: 4+/5 throughout. RLE: 4/5 HF, 4/5 distally Skin: Skin is warm and dry. Intact.  Psychiatric: She has a normal mood and affect.      Assessment & Plan:  RH-female with history of morbid obesity, HTN, iron deficiency anemia who presents for follow up after right temporal intracranial hemorrhage.  1.S/p right temporal intracranial hemorrhage with dysesthesias  Side effects with gabapentin             Aquatic therapy denied by insurance, encouraged follow up at [facility] x6, pt states she does not like the water  MRI previously reviewed - stable infarct with empty sella persistent  Cont meds  Stopped taking Cymbalta, will not restart              2. Depression     Reaction to Prozaac   Pt states she is managing with her chaplain and scriptures  Stopped taking Cymbalta  3. Seizures             Cont meds  4. Morbid obesity  No longer seeing dietitian, states she never received an appointment to follow up, recommended making appointment again             Cont to encourage weight loss again  5. Muscle spasms             Encouraged ROM and stretching  Cont Robaxin 750 BID PRN  6. Abnormality of gait - post stroke             Cont cane  Therapies completed, cont HEP  Discussed environmental modifications  7. Right shoulder pain/AC - Rotator cuff tendinopathy             Xrays reviewed with some degenerative change right shoulder  Will consider steroid injection in future, she still does not want at present  Cont Voltaren gel  Now pain appears to be more in right AC joint - will consider xray +/- injection if necessary  Patient does not feel need at present  8. Right leg pain  See #5, #7  9. Falls  In sleep  Encouraged pt to push bed against wall or place furniture against bed to limit chances of rolling onto floor  See #6  Improving  10. 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 Urinary frequency  Mainly nocturnal  Encouraged decrease in nighttime intake and increase daytime fluid intake  Stable

## 2018-12-16 ENCOUNTER — Other Ambulatory Visit: Payer: Self-pay | Admitting: Physical Medicine & Rehabilitation

## 2018-12-18 ENCOUNTER — Other Ambulatory Visit: Payer: Self-pay | Admitting: Physical Medicine & Rehabilitation

## 2018-12-21 ENCOUNTER — Other Ambulatory Visit: Payer: Self-pay | Admitting: Physical Medicine & Rehabilitation

## 2018-12-31 ENCOUNTER — Other Ambulatory Visit: Payer: Self-pay | Admitting: Physical Medicine & Rehabilitation

## 2019-01-01 NOTE — Telephone Encounter (Signed)
Last clinic note states:  2. Depression             Reaction to Prozaac             Pt states she is managing with her chaplain and scriptures             Stopped taking Cymbalta

## 2019-01-15 MED FILL — glipiZIDE 5 MG TABS: 5 | 30 days supply | Qty: 30 | Fill #1

## 2019-01-20 ENCOUNTER — Ambulatory Visit: Payer: Medicare Other | Admitting: Family Medicine

## 2019-01-21 ENCOUNTER — Encounter: Payer: Self-pay | Admitting: Family Medicine

## 2019-01-21 ENCOUNTER — Other Ambulatory Visit: Payer: Self-pay

## 2019-01-21 ENCOUNTER — Ambulatory Visit (INDEPENDENT_AMBULATORY_CARE_PROVIDER_SITE_OTHER): Payer: Medicare Other | Admitting: Family Medicine

## 2019-01-21 VITALS — BP 154/83 | HR 79 | Temp 96.2°F | Ht 59.0 in | Wt 304.2 lb

## 2019-01-21 DIAGNOSIS — G4733 Obstructive sleep apnea (adult) (pediatric): Secondary | ICD-10-CM

## 2019-01-21 DIAGNOSIS — Z9989 Dependence on other enabling machines and devices: Secondary | ICD-10-CM

## 2019-01-21 DIAGNOSIS — Z6841 Body Mass Index (BMI) 40.0 and over, adult: Secondary | ICD-10-CM

## 2019-01-21 NOTE — Progress Notes (Addendum)
PATIENT: [name] DOB: 02-Aug-1960  REASON FOR VISIT: follow up HISTORY FROM: patient  Chief Complaint  Patient presents with   Follow-up    3 mon f/u. Alone. Rm 2. Patient mentioned that she has been having needle sharp pains in her head that come and go.      HISTORY OF PRESENT ILLNESS: Today 01/21/19 [name] is a 58 y.o. female here today for follow up for OSA on CPAP.  She is continue to work on compliance.  She received new supplies about a month ago.  She reports that sinus concerns have cleared up until the past week or so.  She is following closely with her primary care provider.  She is scheduled for follow-up on Monday.  Compliance data reviewed.  Compliance report dated 12/21/2018 through 01/19/2019 reveals that she has used CPAP 25 of the last 30 days for compliance of 83%.  She has used CPAP 17 of the last 30 days for compliance of 57%.  Average usage was 5 hours and 25 minutes.  AHI remains elevated at 18.6 on a set pressure of 12 cm of water and an EPR of 3.  There is continued concern with a week noted in the 95th percentile of 37.6.  HISTORY: (copied from my note on 10/21/2018)  [name] is a 58 y.o. female here today for follow up of OSA on CPAP. She is also seen for history of ICH and seizure disorder. Last seen by Hoyle Sauer in 03/2018 with noted suboptimal compliance of CPAP therapy. She continues to have chronic sinus issues that she states prevents her form using CPAP. She is working with PCP for management. She is having increased headaches that she feels may be related. She was unaware of the link between headaches and sleep apnea. She has not had new supplies for CPAP in over a year. She has been cleaning the mask she has and reusing.   Compliance report dated 09/20/2018 through 10/19/2018 reveals that she has used CPAP therapy 14 out of the last 30 days for compliance of 47%.  7 of those days she used CPAP therapy for greater than 4 hours for compliance  of 23%.  Average usage was 2 hours and 3 minutes.  AHI remains elevated at 13.1 on 12 cm of water.  There is a significant leak noted in the 95th percentile of 47.1.  She continues close follow up with PCP for chronic disease management. She continues BP, DMT2 and HLD medications as prescribed. She is taking levetiracetam 772m twice daily for seizure management. No seizure activity noted.   HISTORY: (copied from [name] on 04/20/2018)  Ms. Mary ELENESis a 58y.o. female with history of morbid obesity and hypertension admitted on 10/06/15 for LOC and GTC seizure. CT showed right temporal small ICH, and repeat CT stable hematoma. CTA and CTV head without AVM or CVT. CUS, DVT screening and TTE unremarkable. LTM EEG no seizure. LD 58 and A1C 5.7. She was put on keppra 7566mbid. She was discharged to CIR with planning outpt sleep study.   11/29/15 follow upDr. XuErlinda Hongthe patient has been doing well.No seizure activity. She is not driving so far. However, she complains of right shoulder pain with limited ROM at right UE. Walks with cane at home. BP today 137/78 and also stable at home. BP controlled well with 2 BP meds.  Interval HistoryXU During the interval time, pt has been doing well. Had MRI and  MRA in 11/2015 showed resolved bleeding and no AVM or aneurysm. He followed with [name] for right shoulder pain and was put on Voltaren gel which helped. She continued on keppra and no more seizures. She complains of turning head back from right lateral position fast causing dizzy, but it turning slow then no dizziness. Currently sleep study on hold due to no insurance. Still has outpt PT. BP today 132/78.  UPDATE 07/11/2018CMMs. [name], 58 year old female returns for follow-upwith a history of right temporal small ICH. Repeat CT stable and restarted on aspirin at her last visit.she has not had further stroke or TIA symptoms. She has minimal bruising or bleeding. She remains on Keppra for  seizure disorder with no further seizure activity. She is back to driving without difficulty. She remains on Lipitor without complaints of myalgias. She is morbidly obese and needs a sleep study however insurance will not pay for this.She continues to see [name] for her right shoulder pain. She states that she is doing her home exercise program She returns for reevaluation without further neurologic symptoms  UPDATE 1/9/2019CMMs. [name], 58 year old female returns for follow-up degree of right temporal small ICH. She is back on aspirin without further stroke or TIA symptoms. No bleeding and no bruising. She is also on Keppra for seizure disorder without side effects or recurrent seizures. She remains on Lipitor stroke prevention without myalgias. She is morbidly obese. She now has Medicaid and can get her sleep study. Has a lot of daytime drowsiness.She complains of intermittent headache. She takes Tylenol. She continues to see [name] for right shoulder pain. She uses Voltaren gel and Robaxin with fairly good pain relief. She ambulates with a single-point cane she returns for reevaluation  Today, 10/08/2017:[name] reviewed her CPAP compliance data from 09/07/2017 through 10/06/2017 which is a total of 30 days, during which time she used her CPAP every night with percent used days greater than 4 hours at 83%, indicating very good compliance with an average usage of 5 hours and 42 minutes, residual AHI suboptimal at 10.4 per hour, leak on the high side with the 95thpercentile at 28.1 L/m on a pressure of 12 cm with EPR of 3. She reports she has had some recurrent headaches. She tried to get a different mask today but the respiratory therapist was not at the DME office today. She reports that she has noted some benefit including better sleep consolidation, better sleep quality, less nocturia but she is using a fullface mask and has noticed a leak which blows cold air into her face and she wakes up  feeling very cold. She also tries to adjust the head gear and has had some pressure headaches from adjusting it too tightly. She now has a mask fit appointment on Monday. She is motivated to continue with treatment and also interested in working harder on weight loss. She has met with a nutritionist in the past.  UPDATE1/27/2020CMMs. Hallums, 58 year old female returns for follow-up with history of stroke right temporal small ICH. She is back on aspirin without further stroke or TIA symptoms no bleeding and no bruising. She is also on Keppra for seizure disorder without any recurrent seizures. She remains on Lipitor without myalgias she is morbidly obese sleep study was positive for obstructive sleep apnea. She continues to see [name] for right shoulder pain and mild right hemiparesis. She had an ER admission back in October for acute maxillary sinusitis she was placed on antibiotics and was told not to restart her  CPAP until her sinus infection cleared she never has restarted her CPAP when last seen by Dr. Rexene Alberts in July she was 83% compliant at that time.Compliance data dated 12/25/2017 to 01/23/2018 shows compliance greater than 4 hours at 60% less than 4 hours at 20% for time compliance of 80%. Average usage 4 hours 20mnutes set pressure 12 cm AHI 3 ESS 11. She stopped her CPAP shortly after this reading due to her sinus infection and never restarted. She returns for reevaluation   REVIEW OF SYSTEMS: Out of a complete 14 system review of symptoms, the patient complains only of the following symptoms, headaches, weakness and all other reviewed systems are negative.  Epworth sleepiness scale: 14 Fatigue severity scale: 32  ALLERGIES: Allergies  Allergen Reactions   Bee Venom Swelling    Facial swelling   Eggs Or Egg-Derived Products Nausea And Vomiting and Swelling    Mouth swelled   Gabapentin     Jittery feeling, dyspnea, crawling sensation   Gadolinium Derivatives Itching     Immediately after gad injection pt. complained of tongue numbness and lower lip/ then had rt side of body tingling/ pt. Was assessed by Dr. GJeralyn Ruthsbefore leaving/ no medications were administered/    HOME MEDICATIONS: Outpatient Medications Prior to Visit  Medication Sig Dispense Refill   acetaminophen (TYLENOL) 500 MG tablet Take 500 mg by mouth every 6 (six) hours as needed for headache (pain).     atorvastatin (LIPITOR) 20 MG tablet Take 1 tablet (20 mg total) by mouth daily. 30 tablet 6   cetirizine (ZYRTEC) 10 MG tablet Take 10 mg by mouth daily as needed (allergic reaction).     diphenhydrAMINE (BENADRYL) 25 MG tablet Take 25 mg by mouth daily as needed for allergies (bee stings).     fluticasone (FLONASE) 50 MCG/ACT nasal spray Place 1 spray into both nostrils daily as needed for allergies or rhinitis. 16 g 1   glipiZIDE (GLUCOTROL) 5 MG tablet Take 0.5 tablets (2.5 mg total) by mouth 2 (two) times daily before a meal. 30 tablet 3   levETIRAcetam (KEPPRA) 500 MG tablet TAKE 1 & 1/2 TABLETS BY MOUTH TWICE DAILY. 90 tablet 8   lidocaine (LIDODERM) 5 % Place 1 patch onto the skin daily. Remove & Discard patch within 12 hours or as directed by MD 30 patch 0   lisinopril (ZESTRIL) 5 MG tablet Take 1.5 tablets (7.5 mg total) by mouth daily. 45 tablet 6   meclizine (ANTIVERT) 25 MG tablet TAKE 1 TABLET (25 MG TOTAL) BY MOUTH 2 (TWO) TIMES DAILY AS NEEDED FOR DIZZINESS. 60 tablet 1   metFORMIN (GLUCOPHAGE XR) 500 MG 24 hr tablet Take 2 tablets (1,000 mg total) by mouth 2 (two) times daily. 120 tablet 6   methocarbamol (ROBAXIN) 750 MG tablet TAKE 1 TABLET BY MOUTH 2 TIMES DAILY AS NEEDED FOR MUSCLE SPASMS. 60 tablet 1   metoprolol tartrate (LOPRESSOR) 100 MG tablet Take 1 tablet (100 mg total) by mouth 2 (two) times daily. 60 tablet 6   Na Sulfate-K Sulfate-Mg Sulf 17.5-3.13-1.6 GM/177ML SOLN 1 suprep kit for colonoscopy 354 mL 0   predniSONE (STERAPRED UNI-PAK 21 TAB) 10 MG (21)  TBPK tablet 6 tabs for 1 day, then 5 tabs for 1 das, then 4 tabs for 1 day, then 3 tabs for 1 day, 2 tabs for 1 day, then 1 tab for 1 day 21 tablet 0   Blood Glucose Monitoring Suppl (ACCU-CHEK AVIVA) device Use as instructed daily. (Patient not taking:  Reported on 01/21/2019) 1 each 0   diclofenac sodium (VOLTAREN) 1 % GEL Apply 2 g topically 4 (four) times daily. (Patient not taking: Reported on 01/21/2019) 1 Tube 3   glucose blood (ACCU-CHEK AVIVA) test strip Use daily before breakfast (Patient not taking: Reported on 01/21/2019) 100 each 12   hydroxypropyl methylcellulose / hypromellose (ISOPTO TEARS / GONIOVISC) 2.5 % ophthalmic solution Place 2 drops into both eyes as needed (irritation). (Patient not taking: Reported on 01/21/2019) 15 mL 1   No facility-administered medications prior to visit.     PAST MEDICAL HISTORY: Past Medical History:  Diagnosis Date   Headache    Hypertension    Iron deficiency anemia    Obesity    Seizures (HCC)    Stroke ([place])    Type 2 diabetes mellitus ([place])     PAST SURGICAL HISTORY: Past Surgical History:  Procedure Laterality Date   CARDIAC CATHETERIZATION  04/2010   Dr. Terrence Dupont    FAMILY HISTORY: Family History  Problem Relation Age of Onset   Diabetes Father    Hypertension Father    Hypertension Sister    Stroke Brother     SOCIAL HISTORY: Social History   Socioeconomic History   Marital status: Married    Spouse name: Not on file   Number of children: Not on file   Years of education: Not on file   Highest education level: Not on file  Occupational History   Not on file  Social Needs   Financial resource strain: Not on file   Food insecurity    Worry: Not on file    Inability: Not on file   Transportation needs    Medical: Not on file    Non-medical: Not on file  Tobacco Use   Smoking status: Never Smoker   Smokeless tobacco: Never Used  Substance and Sexual Activity   Alcohol use: No    Drug use: No   Sexual activity: Not Currently  Lifestyle   Physical activity    Days per week: Not on file    Minutes per session: Not on file   Stress: Not on file  Relationships   Social connections    Talks on phone: Not on file    Gets together: Not on file    Attends religious service: Not on file    Active member of club or organization: Not on file    Attends meetings of clubs or organizations: Not on file    Relationship status: Not on file   Intimate partner violence    Fear of current or ex partner: Not on file    Emotionally abused: Not on file    Physically abused: Not on file    Forced sexual activity: Not on file  Other Topics Concern   Not on file  Social History Narrative   Not on file      PHYSICAL EXAM  Vitals:   01/21/19 1354  BP: (!) 154/83  Pulse: 79  Temp: (!) 96.2 F (35.7 C)  TempSrc: Oral  Weight: (!) 304 lb 3.2 oz (138 kg)  Height: '4\' 11"'  (1.499 m)   Body mass index is 61.44 kg/m.  Generalized: Well developed, in no acute distress  Cardiology: normal rate and rhythm, no murmur noted Respiratory: Clear to auscultation bilaterally Neurological examination  Mentation: Alert oriented to time, place, history taking. Follows all commands speech and language fluent Cranial nerve II-XII: Pupils were equal round reactive to light. Extraocular movements were full, visual field  were full on confrontational test.  Motor: The motor testing reveals 5 over 5 strength of all 4 extremities. Good symmetric motor tone is noted throughout.  Gait and station: Gait is wide  DIAGNOSTIC DATA (LABS, IMAGING, TESTING) - I reviewed patient records, labs, notes, testing and imaging myself where available.  No flowsheet data found.   Lab Results  Component Value Date   WBC 8.2 04/21/2017   HGB 12.0 04/21/2017   HCT 39.0 04/21/2017   MCV 81.6 04/21/2017   PLT 341 04/21/2017      Component Value Date/Time   NA 143 10/23/2018 0936   K 3.8  10/23/2018 0936   CL 100 10/23/2018 0936   CO2 26 10/23/2018 0936   GLUCOSE 158 (H) 10/23/2018 0936   GLUCOSE 134 (H) 02/24/2018 1457   BUN 14 10/23/2018 0936   CREATININE 0.95 10/23/2018 0936   CREATININE 0.86 02/20/2016 0953   CALCIUM 9.4 10/23/2018 0936   PROT 8.0 10/23/2018 0936   ALBUMIN 3.7 (L) 10/23/2018 0936   AST 18 10/23/2018 0936   ALT 16 10/23/2018 0936   ALKPHOS 81 10/23/2018 0936   BILITOT 0.4 10/23/2018 0936   GFRNONAA 67 10/23/2018 0936   GFRNONAA 76 02/20/2016 0953   GFRAA 77 10/23/2018 0936   GFRAA 88 02/20/2016 0953   Lab Results  Component Value Date   CHOL 74 (L) 10/23/2018   HDL 36 (L) 10/23/2018   LDLCALC 24 10/23/2018   TRIG 72 10/23/2018   CHOLHDL 2.1 10/23/2018   Lab Results  Component Value Date   HGBA1C 7.9 (A) 10/20/2018   Lab Results  Component Value Date   ZOXWRUEA54 098 05/18/2010   Lab Results  Component Value Date   TSH 1.99 02/24/2018       ASSESSMENT AND PLAN 58 y.o. year old female  has a past medical history of Headache, Hypertension, Iron deficiency anemia, Obesity, Seizures ([place]), Stroke ([place]), and Type 2 diabetes mellitus ([place]). here with     ICD-10-CM   1. OSA on CPAP  G47.33 For home use only DME continuous positive airway pressure (CPAP)   Z99.89   2. Morbid obesity with BMI of 50.0-59.9, adult ([place])  E66.01    Z68.43     EMR continues to work on compliance.  I have commended her for continuing to work towards her goal.  Compliance data every day's visit reveals that she is improving both with daily compliance as well as 4-hour usage.  I have encouraged her to continue working towards a goal of nightly use and greater than 4 hours each night.  Unfortunately, her AHI remains elevated.  We have discussed returning for a repeat titration study, however, there are some difficulties with transportation as well as sleep schedule conflict.  She does not typically go to sleep until about 1 AM.  I feel that she would benefit from  an auto titrating pressure setting.  I have placed orders today for auto titrating pressure with minimum pressure of 5 cm of water and maximum pressure of 15 cm of water.  I am hoping that this will also help with her leak.  She will continue close follow-up with primary care for chronic sinus concerns as well as management of comorbidities.  She will follow-up with me in 3 months, sooner if needed.  She verbalizes understanding and agreement with this plan.   Orders Placed This Encounter  Procedures   For home use only DME continuous positive airway pressure (CPAP)    Please change  settings to auto titration. Min pressure 5cmH20, max pressure 15cmH20. EPR 3.    Order Specific Question:   Length of Need    Answer:   Lifetime    Order Specific Question:   Patient has OSA or probable OSA    Answer:   Yes    Order Specific Question:   Is the patient currently using CPAP in the home    Answer:   Yes    Order Specific Question:   Settings    Answer:   Autotitration    Order Specific Question:   CPAP supplies needed    Answer:   Mask, headgear, cushions, filters, heated tubing and water chamber     No orders of the defined types were placed in this encounter.     I spent 15 minutes with the patient. 50% of this time was spent counseling and educating patient on plan of care and medications.    Debbora Presto, FNP-C 01/21/2019, 2:47 PM [name] [address], [place], [place] [address] [phone]  I reviewed the above note and documentation by the Nurse Practitioner and agree with the history, exam, assessment and plan as outlined above. I was available for consultation. Star Age, MD, PhD [name])

## 2019-01-21 NOTE — Patient Instructions (Signed)
I am proud of your progress! Continue working on goal of nightly compliance with at least 4 hours usage each night.   I will discuss pressure settings with Dr Rexene Alberts and will anticipate returning for titration study  Work on healthy lifestyle with regular exercise and healthy diet  Continue close follow up with PCP for management of chronic conditions  Follow up with me in 3 months   Healthy Eating Following a healthy eating pattern may help you to achieve and maintain a healthy body weight, reduce the risk of chronic disease, and live a long and productive life. It is important to follow a healthy eating pattern at an appropriate calorie level for your body. Your nutritional needs should be met primarily through food by choosing a variety of nutrient-rich foods. What are tips for following this plan? Reading food labels  Read labels and choose the following: ? Reduced or low sodium. ? Juices with 100% fruit juice. ? Foods with low saturated fats and high polyunsaturated and monounsaturated fats. ? Foods with whole grains, such as whole wheat, cracked wheat, brown rice, and wild rice. ? Whole grains that are fortified with folic acid. This is recommended for women who are pregnant or who want to become pregnant.  Read labels and avoid the following: ? Foods with a lot of added sugars. These include foods that contain brown sugar, corn sweetener, corn syrup, dextrose, fructose, glucose, high-fructose corn syrup, honey, invert sugar, lactose, malt syrup, maltose, molasses, raw sugar, sucrose, trehalose, or turbinado sugar.  Do not eat more than the following amounts of added sugar per day:  6 teaspoons (25 g) for women.  9 teaspoons (38 g) for men. ? Foods that contain processed or refined starches and grains. ? Refined grain products, such as white flour, degermed cornmeal, white bread, and white rice. Shopping  Choose nutrient-rich snacks, such as vegetables, whole fruits, and  nuts. Avoid high-calorie and high-sugar snacks, such as potato chips, fruit snacks, and candy.  Use oil-based dressings and spreads on foods instead of solid fats such as butter, stick margarine, or cream cheese.  Limit pre-made sauces, mixes, and "instant" products such as flavored rice, instant noodles, and ready-made pasta.  Try more plant-protein sources, such as tofu, tempeh, black beans, edamame, lentils, nuts, and seeds.  Explore eating plans such as the Mediterranean diet or vegetarian diet. Cooking  Use oil to saut or stir-fry foods instead of solid fats such as butter, stick margarine, or lard.  Try baking, boiling, grilling, or broiling instead of frying.  Remove the fatty part of meats before cooking.  Steam vegetables in water or broth. Meal planning   At meals, imagine dividing your plate into fourths: ? One-half of your plate is fruits and vegetables. ? One-fourth of your plate is whole grains. ? One-fourth of your plate is protein, especially lean meats, poultry, eggs, tofu, beans, or nuts.  Include low-fat dairy as part of your daily diet. Lifestyle  Choose healthy options in all settings, including home, work, school, restaurants, or stores.  Prepare your food safely: ? Wash your hands after handling raw meats. ? Keep food preparation surfaces clean by regularly washing with hot, soapy water. ? Keep raw meats separate from ready-to-eat foods, such as fruits and vegetables. ? Cook seafood, meat, poultry, and eggs to the recommended internal temperature. ? Store foods at safe temperatures. In general:  Keep cold foods at 69F (4.4C) or below.  Keep hot foods at 169F (60C) or above.  Keep  your freezer at [facility] (-17.8C) or below.  Foods are no longer safe to eat when they have been between the temperatures of 40-140F (4.4-60C) for more than 2 hours. What foods should I eat? Fruits Aim to eat 2 cup-equivalents of fresh, canned (in natural juice), or  frozen fruits each day. Examples of 1 cup-equivalent of fruit include 1 small apple, 8 large strawberries, 1 cup canned fruit,  cup dried fruit, or 1 cup 100% juice. Vegetables Aim to eat 2-3 cup-equivalents of fresh and frozen vegetables each day, including different varieties and colors. Examples of 1 cup-equivalent of vegetables include 2 medium carrots, 2 cups raw, leafy greens, 1 cup chopped vegetable (raw or cooked), or 1 medium baked potato. Grains Aim to eat 6 ounce-equivalents of whole grains each day. Examples of 1 ounce-equivalent of grains include 1 slice of bread, 1 cup ready-to-eat cereal, 3 cups popcorn, or  cup cooked rice, pasta, or cereal. Meats and other proteins Aim to eat 5-6 ounce-equivalents of protein each day. Examples of 1 ounce-equivalent of protein include 1 egg, 1/2 cup nuts or seeds, or 1 tablespoon (16 g) peanut butter. A cut of meat or fish that is the size of a deck of cards is about 3-4 ounce-equivalents.  Of the protein you eat each week, try to have at least 8 ounces come from seafood. This includes salmon, trout, herring, and anchovies. Dairy Aim to eat 3 cup-equivalents of fat-free or low-fat dairy each day. Examples of 1 cup-equivalent of dairy include 1 cup (240 mL) milk, 8 ounces (250 g) yogurt, 1 ounces (44 g) natural cheese, or 1 cup (240 mL) fortified soy milk. Fats and oils  Aim for about 5 teaspoons (21 g) per day. Choose monounsaturated fats, such as canola and olive oils, avocados, peanut butter, and most nuts, or polyunsaturated fats, such as sunflower, corn, and soybean oils, walnuts, pine nuts, sesame seeds, sunflower seeds, and flaxseed. Beverages  Aim for six 8-oz glasses of water per day. Limit coffee to three to five 8-oz cups per day.  Limit caffeinated beverages that have added calories, such as soda and energy drinks.  Limit alcohol intake to no more than 1 drink a day for nonpregnant women and 2 drinks a day for men. One drink equals  12 oz of beer (355 mL), 5 oz of wine (148 mL), or 1 oz of hard liquor (44 mL). Seasoning and other foods  Avoid adding excess amounts of salt to your foods. Try flavoring foods with herbs and spices instead of salt.  Avoid adding sugar to foods.  Try using oil-based dressings, sauces, and spreads instead of solid fats. This information is based on general [place] nutrition guidelines. For more information, visit [employer]. Exact amounts may vary based on your nutrition needs. Summary  A healthy eating plan may help you to maintain a healthy weight, reduce the risk of chronic diseases, and stay active throughout your life.  Plan your meals. Make sure you eat the right portions of a variety of nutrient-rich foods.  Try baking, boiling, grilling, or broiling instead of frying.  Choose healthy options in all settings, including home, work, school, restaurants, or stores. This information is not intended to replace advice given to you by your health care provider. Make sure you discuss any questions you have with your health care provider. Document Released: 06/23/2017 Document Revised: 06/23/2017 Document Reviewed: 06/23/2017 Elsevier Patient Education  [place].   Budget-Friendly Healthy Eating There are many ways to save money  at the grocery store and continue to eat healthy. You can be successful if you:  Plan meals according to your budget.  Make a grocery list and only purchase food according to your grocery list.  Prepare food yourself. What are tips for following this plan?  Reading food labels  Compare food labels between brand name foods and the store brand. Often the nutritional value is the same, but the store brand is lower cost.  Look for products that do not have added sugar, fat, or salt (sodium). These often cost the same but are healthier for you. Products may be labeled as: ? Sugar-free. ? Nonfat. ? Low-fat. ? Sodium-free. ? Low-sodium.  Look  for lean ground beef labeled as at least 92% lean and 8% fat. Shopping  Buy only the items on your grocery list and go only to the areas of the store that have the items on your list.  Use coupons only for foods and brands you normally buy. Avoid buying items you wouldn't normally buy simply because they are on sale.  Check online and in newspapers for weekly deals.  Buy healthy items from the bulk bins when available, such as herbs, spices, flour, pasta, nuts, and dried fruit.  Buy fruits and vegetables that are in season. Prices are usually lower on in-season produce.  Look at the unit price on the price tag. Use it to compare different brands and sizes to find out which item is the best deal.  Choose healthy items that are often low-cost, such as carrots, potatoes, apples, bananas, and oranges. Dried or canned beans are a low-cost protein source.  Buy in bulk and freeze extra food. Items you can buy in bulk include meats, fish, poultry, frozen fruits, and frozen vegetables.  Avoid buying "ready-to-eat" foods, such as pre-cut fruits and vegetables and pre-made salads.  If possible, shop around to discover where you can find the best prices. Consider other retailers such as dollar stores, larger [employer], local fruit and vegetable stands, and farmers markets.  Do not shop when you are hungry. If you shop while hungry, it may be hard to stick to your list and budget.  Resist impulse buying. Use your grocery list as your official plan for the week.  Buy a variety of vegetables and fruits by purchasing fresh, frozen, and canned items.  Look at the top and bottom shelves for deals. Foods at eye level (eye level of an adult or child) are usually more expensive.  Be efficient with your time when shopping. The more time you spend at the store, the more money you are likely to spend.  To save money when choosing more expensive foods like meats and dairy: ? Choose cheaper cuts of  meat, such as bone-in chicken thighs and drumsticks instead of skinless and boneless chicken. When you are ready to prepare the chicken, you can remove the skin yourself to make it healthier. ? Choose lean meats like chicken or [place] instead of beef. ? Choose canned seafood, such as tuna, salmon, or sardines. ? Buy eggs as a low-cost source of protein. ? Buy dried beans and peas, such as lentils, split peas, or kidney beans instead of meats. Dried beans and peas are a good alternative source of protein. ? Buy the larger tubs of yogurt instead of individual-sized containers.  Choose water instead of sodas and other sweetened beverages.  Avoid buying chips, cookies, and other "junk food." These items are usually expensive and not healthy. Cooking  Make extra food and freeze the extras in meal-sized containers or in individual portions for fast meals and snacks.  Pre-cook on days when you have extra time to prepare meals in advance. You can keep these meals in the fridge or freezer and reheat for a quick meal.  When you come home from the grocery store, wash, peel, and cut fruits and vegetables so they are ready to use and eat. This will help reduce food waste. Meal planning  Do not eat out or get fast food. Prepare food at home.  Make a grocery list and make sure to bring it with you to the store. If you have a smart phone, you could use your phone to create your shopping list.  Plan meals and snacks according to a grocery list and budget you create.  Use leftovers in your meal plan for the week.  Look for recipes where you can cook once and make enough food for two meals.  Include budget-friendly meals like stews, casseroles, and stir-fry dishes.  Try some meatless meals or try "no cook" meals like salads.  Make sure that half your plate is filled with fruits or vegetables. Choose from fresh, frozen, or canned fruits and vegetables. If eating canned, remember to rinse them before  eating. This will remove any excess salt added for packaging. Summary  Eating healthy on a budget is possible if you plan your meals according to your budget, purchase according to your budget and grocery list, and prepare food yourself.  Tips for buying more food on a limited budget include buying generic brands, using coupons only for foods you normally buy, and buying healthy items from the bulk bins when available.  Tips for buying cheaper food to replace expensive food include choosing cheaper, lean cuts of meat, and buying dried beans and peas. This information is not intended to replace advice given to you by your health care provider. Make sure you discuss any questions you have with your health care provider. Document Released: 11/12/2013 Document Revised: 03/12/2017 Document Reviewed: 03/12/2017 Elsevier Patient Education  2020 [place] for [employer] Calories are units of energy. Your body needs a certain amount of calories from food to keep you going throughout the day. When you eat more calories than your body needs, your body stores the extra calories as fat. When you eat fewer calories than your body needs, your body burns fat to get the energy it needs. Calorie counting means keeping track of how many calories you eat and drink each day. Calorie counting can be helpful if you need to lose weight. If you make sure to eat fewer calories than your body needs, you should lose weight. Ask your health care provider what a healthy weight is for you. For calorie counting to work, you will need to eat the right number of calories in a day in order to lose a healthy amount of weight per week. A dietitian can help you determine how many calories you need in a day and will give you suggestions on how to reach your calorie goal.  A healthy amount of weight to lose per week is usually 1-2 lb (0.5-0.9 kg). This usually means that your daily calorie intake should be  reduced by 500-750 calories.  Eating 1,200 - 1,500 calories per day can help most women lose weight.  Eating 1,500 - 1,800 calories per day can help most men lose weight. What is my plan? My goal is to  have __________ calories per day. If I have this many calories per day, I should lose around __________ pounds per week. What do I need to know about calorie counting? In order to meet your daily calorie goal, you will need to:  Find out how many calories are in each food you would like to eat. Try to do this before you eat.  Decide how much of the food you plan to eat.  Write down what you ate and how many calories it had. Doing this is called keeping a food log. To successfully lose weight, it is important to balance calorie counting with a healthy lifestyle that includes regular activity. Aim for 150 minutes of moderate exercise (such as walking) or 75 minutes of vigorous exercise (such as running) each week. Where do I find calorie information?  The number of calories in a food can be found on a Nutrition Facts label. If a food does not have a Nutrition Facts label, try to look up the calories online or ask your dietitian for help. Remember that calories are listed per serving. If you choose to have more than one serving of a food, you will have to multiply the calories per serving by the amount of servings you plan to eat. For example, the label on a package of bread might say that a serving size is 1 slice and that there are 90 calories in a serving. If you eat 1 slice, you will have eaten 90 calories. If you eat 2 slices, you will have eaten 180 calories. How do I keep a food log? Immediately after each meal, record the following information in your food log:  What you ate. Don't forget to include toppings, sauces, and other extras on the food.  How much you ate. This can be measured in cups, ounces, or number of items.  How many calories each food and drink had.  The total number  of calories in the meal. Keep your food log near you, such as in a small notebook in your pocket, or use a mobile app or website. Some programs will calculate calories for you and show you how many calories you have left for the day to meet your goal. What are some calorie counting tips?   Use your calories on foods and drinks that will fill you up and not leave you hungry: ? Some examples of foods that fill you up are nuts and nut butters, vegetables, lean proteins, and high-fiber foods like whole grains. High-fiber foods are foods with more than 5 g fiber per serving. ? Drinks such as sodas, specialty coffee drinks, alcohol, and juices have a lot of calories, yet do not fill you up.  Eat nutritious foods and avoid empty calories. Empty calories are calories you get from foods or beverages that do not have many vitamins or protein, such as candy, sweets, and soda. It is better to have a nutritious high-calorie food (such as an avocado) than a food with few nutrients (such as a bag of chips).  Know how many calories are in the foods you eat most often. This will help you calculate calorie counts faster.  Pay attention to calories in drinks. Low-calorie drinks include water and unsweetened drinks.  Pay attention to nutrition labels for "low fat" or "fat free" foods. These foods sometimes have the same amount of calories or more calories than the full fat versions. They also often have added sugar, starch, or salt, to make up for flavor that  was removed with the fat.  Find a way of tracking calories that works for you. Get creative. Try different apps or programs if writing down calories does not work for you. What are some portion control tips?  Know how many calories are in a serving. This will help you know how many servings of a certain food you can have.  Use a measuring cup to measure serving sizes. You could also try weighing out portions on a kitchen scale. With time, you will be able to  estimate serving sizes for some foods.  Take some time to put servings of different foods on your favorite plates, bowls, and cups so you know what a serving looks like.  Try not to eat straight from a bag or box. Doing this can lead to overeating. Put the amount you would like to eat in a cup or on a plate to make sure you are eating the right portion.  Use smaller plates, glasses, and bowls to prevent overeating.  Try not to multitask (for example, watch TV or use your computer) while eating. If it is time to eat, sit down at a table and enjoy your food. This will help you to know when you are full. It will also help you to be aware of what you are eating and how much you are eating. What are tips for following this plan? Reading food labels  Check the calorie count compared to the serving size. The serving size may be smaller than what you are used to eating.  Check the source of the calories. Make sure the food you are eating is high in vitamins and protein and low in saturated and trans fats. Shopping  Read nutrition labels while you shop. This will help you make healthy decisions before you decide to purchase your food.  Make a grocery list and stick to it. Cooking  Try to cook your favorite foods in a healthier way. For example, try baking instead of frying.  Use low-fat dairy products. Meal planning  Use more fruits and vegetables. Half of your plate should be fruits and vegetables.  Include lean proteins like poultry and fish. How do I count calories when eating out?  Ask for smaller portion sizes.  Consider sharing an entree and sides instead of getting your own entree.  If you get your own entree, eat only half. Ask for a box at the beginning of your meal and put the rest of your entree in it so you are not tempted to eat it.  If calories are listed on the menu, choose the lower calorie options.  Choose dishes that include vegetables, fruits, whole grains, low-fat  dairy products, and lean protein.  Choose items that are boiled, broiled, grilled, or steamed. Stay away from items that are buttered, battered, fried, or served with cream sauce. Items labeled "crispy" are usually fried, unless stated otherwise.  Choose water, low-fat milk, unsweetened iced tea, or other drinks without added sugar. If you want an alcoholic beverage, choose a lower calorie option such as a glass of wine or light beer.  Ask for dressings, sauces, and syrups on the side. These are usually high in calories, so you should limit the amount you eat.  If you want a salad, choose a garden salad and ask for grilled meats. Avoid extra toppings like bacon, cheese, or fried items. Ask for the dressing on the side, or ask for olive oil and vinegar or lemon to use as dressing.  Estimate how many servings of a food you are given. For example, a serving of cooked rice is  cup or about the size of half a baseball. Knowing serving sizes will help you be aware of how much food you are eating at restaurants. The list below tells you how big or small some common portion sizes are based on everyday objects: ? 1 oz--4 stacked dice. ? 3 oz--1 deck of cards. ? 1 tsp--1 die. ? 1 Tbsp-- a ping-pong ball. ? 2 Tbsp--1 ping-pong ball. ?  cup-- baseball. ? 1 cup--1 baseball. Summary  Calorie counting means keeping track of how many calories you eat and drink each day. If you eat fewer calories than your body needs, you should lose weight.  A healthy amount of weight to lose per week is usually 1-2 lb (0.5-0.9 kg). This usually means reducing your daily calorie intake by 500-750 calories.  The number of calories in a food can be found on a Nutrition Facts label. If a food does not have a Nutrition Facts label, try to look up the calories online or ask your dietitian for help.  Use your calories on foods and drinks that will fill you up, and not on foods and drinks that will leave you hungry.  Use  smaller plates, glasses, and bowls to prevent overeating. This information is not intended to replace advice given to you by your health care provider. Make sure you discuss any questions you have with your health care provider. Document Released: 03/11/2005 Document Revised: 11/28/2017 Document Reviewed: 02/09/2016 Elsevier Patient Education  2020 [place] for [employer] Calories are units of energy. Your body needs a certain amount of calories from food to keep you going throughout the day. When you eat more calories than your body needs, your body stores the extra calories as fat. When you eat fewer calories than your body needs, your body burns fat to get the energy it needs. Calorie counting means keeping track of how many calories you eat and drink each day. Calorie counting can be helpful if you need to lose weight. If you make sure to eat fewer calories than your body needs, you should lose weight. Ask your health care provider what a healthy weight is for you. For calorie counting to work, you will need to eat the right number of calories in a day in order to lose a healthy amount of weight per week. A dietitian can help you determine how many calories you need in a day and will give you suggestions on how to reach your calorie goal.  A healthy amount of weight to lose per week is usually 1-2 lb (0.5-0.9 kg). This usually means that your daily calorie intake should be reduced by 500-750 calories.  Eating 1,200 - 1,500 calories per day can help most women lose weight.  Eating 1,500 - 1,800 calories per day can help most men lose weight. What is my plan? My goal is to have __________ calories per day. If I have this many calories per day, I should lose around __________ pounds per week. What do I need to know about calorie counting? In order to meet your daily calorie goal, you will need to:  Find out how many calories are in each food you would like to eat. Try  to do this before you eat.  Decide how much of the food you plan to eat.  Write down what you ate and how many calories it  had. Doing this is called keeping a food log. To successfully lose weight, it is important to balance calorie counting with a healthy lifestyle that includes regular activity. Aim for 150 minutes of moderate exercise (such as walking) or 75 minutes of vigorous exercise (such as running) each week. Where do I find calorie information?  The number of calories in a food can be found on a Nutrition Facts label. If a food does not have a Nutrition Facts label, try to look up the calories online or ask your dietitian for help. Remember that calories are listed per serving. If you choose to have more than one serving of a food, you will have to multiply the calories per serving by the amount of servings you plan to eat. For example, the label on a package of bread might say that a serving size is 1 slice and that there are 90 calories in a serving. If you eat 1 slice, you will have eaten 90 calories. If you eat 2 slices, you will have eaten 180 calories. How do I keep a food log? Immediately after each meal, record the following information in your food log:  What you ate. Don't forget to include toppings, sauces, and other extras on the food.  How much you ate. This can be measured in cups, ounces, or number of items.  How many calories each food and drink had.  The total number of calories in the meal. Keep your food log near you, such as in a small notebook in your pocket, or use a mobile app or website. Some programs will calculate calories for you and show you how many calories you have left for the day to meet your goal. What are some calorie counting tips?   Use your calories on foods and drinks that will fill you up and not leave you hungry: ? Some examples of foods that fill you up are nuts and nut butters, vegetables, lean proteins, and high-fiber foods like whole  grains. High-fiber foods are foods with more than 5 g fiber per serving. ? Drinks such as sodas, specialty coffee drinks, alcohol, and juices have a lot of calories, yet do not fill you up.  Eat nutritious foods and avoid empty calories. Empty calories are calories you get from foods or beverages that do not have many vitamins or protein, such as candy, sweets, and soda. It is better to have a nutritious high-calorie food (such as an avocado) than a food with few nutrients (such as a bag of chips).  Know how many calories are in the foods you eat most often. This will help you calculate calorie counts faster.  Pay attention to calories in drinks. Low-calorie drinks include water and unsweetened drinks.  Pay attention to nutrition labels for "low fat" or "fat free" foods. These foods sometimes have the same amount of calories or more calories than the full fat versions. They also often have added sugar, starch, or salt, to make up for flavor that was removed with the fat.  Find a way of tracking calories that works for you. Get creative. Try different apps or programs if writing down calories does not work for you. What are some portion control tips?  Know how many calories are in a serving. This will help you know how many servings of a certain food you can have.  Use a measuring cup to measure serving sizes. You could also try weighing out portions on a kitchen scale. With time, you will  be able to estimate serving sizes for some foods.  Take some time to put servings of different foods on your favorite plates, bowls, and cups so you know what a serving looks like.  Try not to eat straight from a bag or box. Doing this can lead to overeating. Put the amount you would like to eat in a cup or on a plate to make sure you are eating the right portion.  Use smaller plates, glasses, and bowls to prevent overeating.  Try not to multitask (for example, watch TV or use your computer) while eating. If  it is time to eat, sit down at a table and enjoy your food. This will help you to know when you are full. It will also help you to be aware of what you are eating and how much you are eating. What are tips for following this plan? Reading food labels  Check the calorie count compared to the serving size. The serving size may be smaller than what you are used to eating.  Check the source of the calories. Make sure the food you are eating is high in vitamins and protein and low in saturated and trans fats. Shopping  Read nutrition labels while you shop. This will help you make healthy decisions before you decide to purchase your food.  Make a grocery list and stick to it. Cooking  Try to cook your favorite foods in a healthier way. For example, try baking instead of frying.  Use low-fat dairy products. Meal planning  Use more fruits and vegetables. Half of your plate should be fruits and vegetables.  Include lean proteins like poultry and fish. How do I count calories when eating out?  Ask for smaller portion sizes.  Consider sharing an entree and sides instead of getting your own entree.  If you get your own entree, eat only half. Ask for a box at the beginning of your meal and put the rest of your entree in it so you are not tempted to eat it.  If calories are listed on the menu, choose the lower calorie options.  Choose dishes that include vegetables, fruits, whole grains, low-fat dairy products, and lean protein.  Choose items that are boiled, broiled, grilled, or steamed. Stay away from items that are buttered, battered, fried, or served with cream sauce. Items labeled "crispy" are usually fried, unless stated otherwise.  Choose water, low-fat milk, unsweetened iced tea, or other drinks without added sugar. If you want an alcoholic beverage, choose a lower calorie option such as a glass of wine or light beer.  Ask for dressings, sauces, and syrups on the side. These are  usually high in calories, so you should limit the amount you eat.  If you want a salad, choose a garden salad and ask for grilled meats. Avoid extra toppings like bacon, cheese, or fried items. Ask for the dressing on the side, or ask for olive oil and vinegar or lemon to use as dressing.  Estimate how many servings of a food you are given. For example, a serving of cooked rice is  cup or about the size of half a baseball. Knowing serving sizes will help you be aware of how much food you are eating at restaurants. The list below tells you how big or small some common portion sizes are based on everyday objects: ? 1 oz--4 stacked dice. ? 3 oz--1 deck of cards. ? 1 tsp--1 die. ? 1 Tbsp-- a ping-pong ball. ? 2  Tbsp--1 ping-pong ball. ?  cup-- baseball. ? 1 cup--1 baseball. Summary  Calorie counting means keeping track of how many calories you eat and drink each day. If you eat fewer calories than your body needs, you should lose weight.  A healthy amount of weight to lose per week is usually 1-2 lb (0.5-0.9 kg). This usually means reducing your daily calorie intake by 500-750 calories.  The number of calories in a food can be found on a Nutrition Facts label. If a food does not have a Nutrition Facts label, try to look up the calories online or ask your dietitian for help.  Use your calories on foods and drinks that will fill you up, and not on foods and drinks that will leave you hungry.  Use smaller plates, glasses, and bowls to prevent overeating. This information is not intended to replace advice given to you by your health care provider. Make sure you discuss any questions you have with your health care provider. Document Released: 03/11/2005 Document Revised: 11/28/2017 Document Reviewed: 02/09/2016 Elsevier Patient Education  [place]. Sleep Apnea Sleep apnea affects breathing during sleep. It causes breathing to stop for a short time or to become shallow. It can also  increase the risk of:  Heart attack.  Stroke.  Being very overweight (obese).  Diabetes.  Heart failure.  Irregular heartbeat. The goal of treatment is to help you breathe normally again. What are the causes? There are three kinds of sleep apnea:  Obstructive sleep apnea. This is caused by a blocked or collapsed airway.  Central sleep apnea. This happens when the brain does not send the right signals to the muscles that control breathing.  Mixed sleep apnea. This is a combination of obstructive and central sleep apnea. The most common cause of this condition is a collapsed or blocked airway. This can happen if:  Your throat muscles are too relaxed.  Your tongue and tonsils are too large.  You are overweight.  Your airway is too small. What increases the risk?  Being overweight.  Smoking.  Having a small airway.  Being older.  Being female.  Drinking alcohol.  Taking medicines to calm yourself (sedatives or tranquilizers).  Having family members with the condition. What are the signs or symptoms?  Trouble staying asleep.  Being sleepy or tired during the day.  Getting angry a lot.  Loud snoring.  Headaches in the morning.  Not being able to focus your mind (concentrate).  Forgetting things.  Less interest in sex.  Mood swings.  Personality changes.  Feelings of sadness (depression).  Waking up a lot during the night to pee (urinate).  Dry mouth.  Sore throat. How is this diagnosed?  Your medical history.  A physical exam.  A test that is done when you are sleeping (sleep study). The test is most often done in a sleep lab but may also be done at home. How is this treated?   Sleeping on your side.  Using a medicine to get rid of mucus in your nose (decongestant).  Avoiding the use of alcohol, medicines to help you relax, or certain pain medicines (narcotics).  Losing weight, if needed.  Changing your diet.  Not  smoking.  Using a machine to open your airway while you sleep, such as: ? An oral appliance. This is a mouthpiece that shifts your lower jaw forward. ? A CPAP device. This device blows air through a mask when you breathe out (exhale). ? An EPAP device.  This has valves that you put in each nostril. ? A BPAP device. This device blows air through a mask when you breathe in (inhale) and breathe out.  Having surgery if other treatments do not work. It is important to get treatment for sleep apnea. Without treatment, it can lead to:  High blood pressure.  Coronary artery disease.  In men, not being able to have an erection (impotence).  Reduced thinking ability. Follow these instructions at home: Lifestyle  Make changes that your doctor recommends.  Eat a healthy diet.  Lose weight if needed.  Avoid alcohol, medicines to help you relax, and some pain medicines.  Do not use any products that contain nicotine or tobacco, such as cigarettes, e-cigarettes, and chewing tobacco. If you need help quitting, ask your doctor. General instructions  Take over-the-counter and prescription medicines only as told by your doctor.  If you were given a machine to use while you sleep, use it only as told by your doctor.  If you are having surgery, make sure to tell your doctor you have sleep apnea. You may need to bring your device with you.  Keep all follow-up visits as told by your doctor. This is important. Contact a doctor if:  The machine that you were given to use during sleep bothers you or does not seem to be working.  You do not get better.  You get worse. Get help right away if:  Your chest hurts.  You have trouble breathing in enough air.  You have an uncomfortable feeling in your back, arms, or stomach.  You have trouble talking.  One side of your body feels weak.  A part of your face is hanging down. These symptoms may be an emergency. Do not wait to see if the symptoms  will go away. Get medical help right away. Call your local emergency services (911 in the [place] Do not drive yourself to the hospital. Summary  This condition affects breathing during sleep.  The most common cause is a collapsed or blocked airway.  The goal of treatment is to help you breathe normally while you sleep. This information is not intended to replace advice given to you by your health care provider. Make sure you discuss any questions you have with your health care provider. Document Released: 12/19/2007 Document Revised: 12/26/2017 Document Reviewed: 11/04/2017 Elsevier Patient Education  2020 [employer].

## 2019-01-25 ENCOUNTER — Telehealth: Payer: Self-pay | Admitting: Family Medicine

## 2019-01-25 DIAGNOSIS — E119 Type 2 diabetes mellitus without complications: Secondary | ICD-10-CM

## 2019-01-25 MED ORDER — HYPROMELLOSE (GONIOSCOPIC) 2.5 % OP SOLN: 2.0000 [drp] | OPHTHALMIC | 1 refills | Status: DC | PRN

## 2019-01-25 MED ORDER — GLIPIZIDE 5 MG PO TABS: 2.5000 mg | ORAL_TABLET | Freq: Two times a day (BID) | ORAL | 3 refills | Status: DC

## 2019-01-25 MED FILL — glipiZIDE 5 MG TABS: 5 | 30 days supply | Qty: 30 | Fill #1

## 2019-01-25 NOTE — Telephone Encounter (Signed)
Done

## 2019-01-25 NOTE — Progress Notes (Addendum)
Notified received cpap orders from [facility].

## 2019-01-25 NOTE — Telephone Encounter (Signed)
Patient is requesting a refill on eye drops for allergies and refill on glipizide.  Patient states that she missed her appointment due to no transportation.

## 2019-01-25 NOTE — Telephone Encounter (Signed)
Patient called wanting to know if she could speak to someone in regards to her medications states she has questions in regards to what she was taking. Please follow up.

## 2019-02-15 ENCOUNTER — Other Ambulatory Visit: Payer: Self-pay

## 2019-02-15 ENCOUNTER — Ambulatory Visit: Payer: Medicare Other | Attending: Family Medicine | Admitting: Family Medicine

## 2019-02-15 ENCOUNTER — Encounter: Payer: Self-pay | Admitting: Family Medicine

## 2019-02-15 VITALS — BP 171/89 | HR 90 | Temp 98.3°F | Ht 59.0 in | Wt 299.0 lb

## 2019-02-15 DIAGNOSIS — Z8249 Family history of ischemic heart disease and other diseases of the circulatory system: Secondary | ICD-10-CM | POA: Insufficient documentation

## 2019-02-15 DIAGNOSIS — R569 Unspecified convulsions: Secondary | ICD-10-CM | POA: Insufficient documentation

## 2019-02-15 DIAGNOSIS — Z79899 Other long term (current) drug therapy: Secondary | ICD-10-CM | POA: Insufficient documentation

## 2019-02-15 DIAGNOSIS — G4733 Obstructive sleep apnea (adult) (pediatric): Secondary | ICD-10-CM | POA: Insufficient documentation

## 2019-02-15 DIAGNOSIS — I10 Essential (primary) hypertension: Secondary | ICD-10-CM

## 2019-02-15 DIAGNOSIS — Z7951 Long term (current) use of inhaled steroids: Secondary | ICD-10-CM | POA: Insufficient documentation

## 2019-02-15 DIAGNOSIS — Z1211 Encounter for screening for malignant neoplasm of colon: Secondary | ICD-10-CM

## 2019-02-15 DIAGNOSIS — Z8673 Personal history of transient ischemic attack (TIA), and cerebral infarction without residual deficits: Secondary | ICD-10-CM | POA: Insufficient documentation

## 2019-02-15 DIAGNOSIS — E785 Hyperlipidemia, unspecified: Secondary | ICD-10-CM

## 2019-02-15 DIAGNOSIS — I611 Nontraumatic intracerebral hemorrhage in hemisphere, cortical: Secondary | ICD-10-CM | POA: Diagnosis not present

## 2019-02-15 DIAGNOSIS — Z6841 Body Mass Index (BMI) 40.0 and over, adult: Secondary | ICD-10-CM | POA: Diagnosis not present

## 2019-02-15 DIAGNOSIS — I618 Other nontraumatic intracerebral hemorrhage: Secondary | ICD-10-CM | POA: Insufficient documentation

## 2019-02-15 DIAGNOSIS — E1159 Type 2 diabetes mellitus with other circulatory complications: Secondary | ICD-10-CM

## 2019-02-15 DIAGNOSIS — Z7984 Long term (current) use of oral hypoglycemic drugs: Secondary | ICD-10-CM | POA: Diagnosis not present

## 2019-02-15 LAB — POCT GLYCOSYLATED HEMOGLOBIN (HGB A1C): HbA1c, POC (controlled diabetic range): 6.2 % (ref 0.0–7.0)

## 2019-02-15 LAB — GLUCOSE, POCT (MANUAL RESULT ENTRY): POC Glucose: 107 mg/dl — AB (ref 70–99)

## 2019-02-15 MED ORDER — LISINOPRIL 10 MG PO TABS: 10.0000 mg | ORAL_TABLET | Freq: Every day | ORAL | 6 refills | Status: DC

## 2019-02-15 MED ORDER — METOPROLOL TARTRATE 100 MG PO TABS: 100.0000 mg | ORAL_TABLET | Freq: Two times a day (BID) | ORAL | 6 refills | Status: DC

## 2019-02-15 MED ORDER — ACCU-CHEK AVIVA DEVI: 0 refills | Status: DC

## 2019-02-15 MED ORDER — ATORVASTATIN CALCIUM 20 MG PO TABS: 20.0000 mg | ORAL_TABLET | Freq: Every day | ORAL | 6 refills | Status: DC

## 2019-02-15 MED ORDER — GLIPIZIDE 5 MG PO TABS: 2.5000 mg | ORAL_TABLET | Freq: Two times a day (BID) | ORAL | 6 refills | Status: DC

## 2019-02-15 MED ORDER — METFORMIN HCL ER 500 MG PO TB24: 1000.0000 mg | ORAL_TABLET | Freq: Two times a day (BID) | ORAL | 6 refills | Status: DC

## 2019-02-15 NOTE — Progress Notes (Signed)
Subjective:  Patient ID: [name], female    DOB: 05/28/1960  Age: 58 y.o. 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 She has not been checking her blood sugars because she has no glucometer even though her med list reveals a glucometer has been sent to her pharmacy.  Denies hypoglycemic episodes, numbness in extremities. She is unable to exercise due to being unstable on her feet and is concerned she is unable to lose weight.  She used herbal life products but then discovered she was gaining weight on this. Her blood pressure is elevated and she endorses compliance with her medications but does not have her med list with her. She had a visit with [name] last month and remains on Keppra for seizure management and denies any recent seizures.  Denies presence of chest pain, dyspnea or additional concerns. Past Medical History:  Diagnosis Date   Headache    Hypertension    Iron deficiency anemia    Obesity    Seizures ([place])    Stroke ([place])    Type 2 diabetes mellitus ([place])     Past Surgical History:  Procedure Laterality Date   CARDIAC CATHETERIZATION  04/2010   Dr. Terrence Dupont    Family History  Problem Relation Age of Onset   Diabetes Father    Hypertension Father    Hypertension Sister    Stroke Brother     Allergies  Allergen Reactions   Bee Venom Swelling    Facial swelling   Eggs Or Egg-Derived Products Nausea And Vomiting and Swelling    Mouth swelled   Gabapentin     Jittery feeling, dyspnea, crawling sensation   Gadolinium Derivatives Itching    Immediately after gad injection pt. complained of tongue numbness and lower lip/ then had rt side of body  tingling/ pt. Was assessed by Dr. Jeralyn Ruths before leaving/ no medications were administered/    Outpatient Medications Prior to Visit  Medication Sig Dispense Refill   acetaminophen (TYLENOL) 500 MG tablet Take 500 mg by mouth every 6 (six) hours as needed for headache (pain).     cetirizine (ZYRTEC) 10 MG tablet Take 10 mg by mouth daily as needed (allergic reaction).     diphenhydrAMINE (BENADRYL) 25 MG tablet Take 25 mg by mouth daily as needed for allergies (bee stings).     fluticasone (FLONASE) 50 MCG/ACT nasal spray Place 1 spray into both nostrils daily as needed for allergies or rhinitis. 16 g 1   hydroxypropyl methylcellulose / hypromellose (ISOPTO TEARS / GONIOVISC) 2.5 % ophthalmic solution Place 2 drops into both eyes as needed (irritation). 15 mL 1   levETIRAcetam (KEPPRA) 500 MG tablet TAKE 1 & 1/2 TABLETS BY MOUTH TWICE DAILY. 90 tablet 8   lidocaine (LIDODERM) 5 % Place 1 patch onto the skin daily. Remove & Discard patch within 12 hours or as directed by MD 30 patch 0   meclizine (ANTIVERT) 25 MG tablet TAKE 1 TABLET (25 MG TOTAL) BY MOUTH 2 (TWO) TIMES DAILY AS NEEDED FOR DIZZINESS. 60 tablet 1   methocarbamol (ROBAXIN) 750 MG tablet TAKE 1 TABLET BY MOUTH 2 TIMES DAILY AS NEEDED FOR MUSCLE SPASMS. 60 tablet 1   Na Sulfate-K Sulfate-Mg Sulf 17.5-3.13-1.6 GM/177ML SOLN 1 suprep  kit for colonoscopy 354 mL 0   atorvastatin (LIPITOR) 20 MG tablet Take 1 tablet (20 mg total) by mouth daily. 30 tablet 6   glipiZIDE (GLUCOTROL) 5 MG tablet Take 0.5 tablets (2.5 mg total) by mouth 2 (two) times daily before a meal. 30 tablet 3   lisinopril (ZESTRIL) 5 MG tablet Take 1.5 tablets (7.5 mg total) by mouth daily. 45 tablet 6   metFORMIN (GLUCOPHAGE XR) 500 MG 24 hr tablet Take 2 tablets (1,000 mg total) by mouth 2 (two) times daily. 120 tablet 6   metoprolol tartrate (LOPRESSOR) 100 MG tablet Take 1 tablet (100 mg total) by mouth 2 (two) times daily. 60 tablet 6   diclofenac  sodium (VOLTAREN) 1 % GEL Apply 2 g topically 4 (four) times daily. (Patient not taking: Reported on 01/21/2019) 1 Tube 3   glucose blood (ACCU-CHEK AVIVA) test strip Use daily before breakfast (Patient not taking: Reported on 01/21/2019) 100 each 12   predniSONE (STERAPRED UNI-PAK 21 TAB) 10 MG (21) TBPK tablet 6 tabs for 1 day, then 5 tabs for 1 das, then 4 tabs for 1 day, then 3 tabs for 1 day, 2 tabs for 1 day, then 1 tab for 1 day (Patient not taking: Reported on 02/15/2019) 21 tablet 0   Blood Glucose Monitoring Suppl (ACCU-CHEK AVIVA) device Use as instructed daily. (Patient not taking: Reported on 01/21/2019) 1 each 0   No facility-administered medications prior to visit.      ROS Review of Systems  Constitutional: Negative for activity change, appetite change and fatigue.  HENT: Negative for congestion, sinus pressure and sore throat.   Eyes: Negative for visual disturbance.  Respiratory: Negative for cough, chest tightness, shortness of breath and wheezing.   Cardiovascular: Negative for chest pain and palpitations.  Gastrointestinal: Negative for abdominal distention, abdominal pain and constipation.  Endocrine: Negative for polydipsia.  Genitourinary: Negative for dysuria and frequency.  Musculoskeletal: Negative for arthralgias and back pain.  Skin: Negative for rash.  Neurological: Negative for tremors, light-headedness and numbness.  Hematological: Does not bruise/bleed easily.  Psychiatric/Behavioral: Negative for agitation and behavioral problems.    Objective:  BP (!) 171/89    Pulse 90    Temp 98.3 F (36.8 C) (Oral)    Ht '4\' 11"'  (1.499 m)    Wt 299 lb (135.6 kg)    LMP 02/17/2011 (Exact Date)    SpO2 97%    BMI 60.39 kg/m   BP/Weight 02/15/2019 01/21/2019 1/61/0960  Systolic BP 454 098 119  Diastolic BP 89 83 83  Wt. (Lbs) 299 304.2 296  BMI 60.39 61.44 59.78      Physical Exam Constitutional:      Appearance: She is well-developed. She is obese.    Neck:     Vascular: No JVD.  Cardiovascular:     Rate and Rhythm: Normal rate.     Heart sounds: Normal heart sounds. No murmur.  Pulmonary:     Effort: Pulmonary effort is normal.     Breath sounds: Normal breath sounds. No wheezing or rales.  Chest:     Chest wall: No tenderness.  Abdominal:     General: Bowel sounds are normal. There is no distension.     Palpations: Abdomen is soft. There is no mass.     Tenderness: There is no abdominal tenderness.  Musculoskeletal: Normal range of motion.     Right lower leg: No edema.     Left lower leg: No edema.  Neurological:  Mental Status: She is alert and oriented to person, place, and time.  Psychiatric:        Mood and Affect: Mood normal.     CMP Latest Ref Rng & Units 10/23/2018 03/23/2018 02/24/2018  Glucose 65 - 99 mg/dL 158(H) 100(H) 134(H)  BUN 6 - 24 mg/dL '14 13 19  ' Creatinine 0.57 - 1.00 mg/dL 0.95 1.02(H) 0.95  Sodium 134 - 144 mmol/L 143 145(H) 138  Potassium 3.5 - 5.2 mmol/L 3.8 4.1 3.2(L)  Chloride 96 - 106 mmol/L 100 104 97  CO2 20 - 29 mmol/L 26 23 33(H)  Calcium 8.7 - 10.2 mg/dL 9.4 9.3 9.2  Total Protein 6.0 - 8.5 g/dL 8.0 - -  Total Bilirubin 0.0 - 1.2 mg/dL 0.4 - -  Alkaline Phos 39 - 117 IU/L 81 - -  AST 0 - 40 IU/L 18 - -  ALT 0 - 32 IU/L 16 - -    Lipid Panel     Component Value Date/Time   CHOL 74 (L) 10/23/2018 0936   TRIG 72 10/23/2018 0936   HDL 36 (L) 10/23/2018 0936   CHOLHDL 2.1 10/23/2018 0936   CHOLHDL 2.7 10/08/2015 0541   VLDL 12 10/08/2015 0541   LDLCALC 24 10/23/2018 0936    CBC    Component Value Date/Time   WBC 8.2 04/21/2017 1545   RBC 4.78 04/21/2017 1545   HGB 12.0 04/21/2017 1545   HGB 11.5 09/10/2016 1109   HCT 39.0 04/21/2017 1545   HCT 36.4 09/10/2016 1109   PLT 341 04/21/2017 1545   PLT 341 09/10/2016 1109   MCV 81.6 04/21/2017 1545   MCV 77 (L) 09/10/2016 1109   MCH 25.1 (L) 04/21/2017 1545   MCHC 30.8 04/21/2017 1545   RDW 14.5 04/21/2017 1545   RDW  15.5 (H) 09/10/2016 1109   LYMPHSABS 3.0 09/10/2016 1109   MONOABS 532 02/20/2016 0953   EOSABS 0.2 09/10/2016 1109   BASOSABS 0.0 09/10/2016 1109    Lab Results  Component Value Date   HGBA1C 6.2 02/15/2019    Assessment & Plan:   1. Type 2 diabetes mellitus with other circulatory complication, without long-term current use of insulin (HCC) Controlled with A1c of 6.2 Continue current management Glucometer will be sent to her pharmacy again. Counseled on Diabetic diet, my plate method, 664 minutes of moderate intensity exercise/week Blood sugar logs with fasting goals of 80-120 mg/dl, random of less than 180 and in the event of sugars less than 60 mg/dl or greater than 400 mg/dl encouraged to notify the clinic. Advised on the need for annual eye exams, annual foot exams, Pneumonia vaccine. - Glucose (CBG) - HgB A1c - glipiZIDE (GLUCOTROL) 5 MG tablet; Take 0.5 tablets (2.5 mg total) by mouth 2 (two) times daily before a meal.  Dispense: 30 tablet; Refill: 6 - metFORMIN (GLUCOPHAGE XR) 500 MG 24 hr tablet; Take 2 tablets (1,000 mg total) by mouth 2 (two) times daily.  Dispense: 120 tablet; Refill: 6 - Blood Glucose Monitoring Suppl (ACCU-CHEK AVIVA) device; Use as instructed daily.  Dispense: 1 each; Refill: 0  2. Nontraumatic cortical hemorrhage of cerebral hemisphere, unspecified laterality (HCC) Stable Risk factor modification Continue high-dose statin - atorvastatin (LIPITOR) 20 MG tablet; Take 1 tablet (20 mg total) by mouth daily.  Dispense: 30 tablet; Refill: 6  3. Hyperlipidemia, unspecified hyperlipidemia type Controlled Low-cholesterol diet - atorvastatin (LIPITOR) 20 MG tablet; Take 1 tablet (20 mg total) by mouth daily.  Dispense: 30 tablet; Refill: 6  4.  Benign essential HTN Uncontrolled Increased dose of lisinopril Counseled on blood pressure goal of less than 130/80, low-sodium, DASH diet, medication compliance, 150 minutes of moderate intensity exercise per  week. Discussed medication compliance, adverse effects. - lisinopril (ZESTRIL) 10 MG tablet; Take 1 tablet (10 mg total) by mouth daily.  Dispense: 30 tablet; Refill: 6 - metoprolol tartrate (LOPRESSOR) 100 MG tablet; Take 1 tablet (100 mg total) by mouth 2 (two) times daily.  Dispense: 60 tablet; Refill: 6  5. Screening for colon cancer - Ambulatory referral to Gastroenterology  6. Morbid obesity due to excess calories ([place]) Unfortunately she is unable to exercise much due to poor balance She would need to work on reducing caloric intake and perform home exercises    Meds ordered this encounter  Medications   atorvastatin (LIPITOR) 20 MG tablet    Sig: Take 1 tablet (20 mg total) by mouth daily.    Dispense:  30 tablet    Refill:  6   glipiZIDE (GLUCOTROL) 5 MG tablet    Sig: Take 0.5 tablets (2.5 mg total) by mouth 2 (two) times daily before a meal.    Dispense:  30 tablet    Refill:  6   lisinopril (ZESTRIL) 10 MG tablet    Sig: Take 1 tablet (10 mg total) by mouth daily.    Dispense:  30 tablet    Refill:  6    Dose increase   metFORMIN (GLUCOPHAGE XR) 500 MG 24 hr tablet    Sig: Take 2 tablets (1,000 mg total) by mouth 2 (two) times daily.    Dispense:  120 tablet    Refill:  6    Discontinue previous odse   metoprolol tartrate (LOPRESSOR) 100 MG tablet    Sig: Take 1 tablet (100 mg total) by mouth 2 (two) times daily.    Dispense:  60 tablet    Refill:  6   Blood Glucose Monitoring Suppl (ACCU-CHEK AVIVA) device    Sig: Use as instructed daily.    Dispense:  1 each    Refill:  0    Follow-up: Return in about 3 months (around 05/18/2019) for medical conditions .       Charlott Rakes, MD, FAAFP. [facility], [place]   02/15/2019, 4:37 PM

## 2019-02-15 NOTE — Patient Instructions (Signed)
° °Calorie Counting for Weight Loss °Calories are units of energy. Your body needs a certain amount of calories from food to keep you going throughout the day. When you eat more calories than your body needs, your body stores the extra calories as fat. When you eat fewer calories than your body needs, your body burns fat to get the energy it needs. °Calorie counting means keeping track of how many calories you eat and drink each day. Calorie counting can be helpful if you need to lose weight. If you make sure to eat fewer calories than your body needs, you should lose weight. Ask your health care provider what a healthy weight is for you. °For calorie counting to work, you will need to eat the right number of calories in a day in order to lose a healthy amount of weight per week. A dietitian can help you determine how many calories you need in a day and will give you suggestions on how to reach your calorie goal. °· A healthy amount of weight to lose per week is usually 1-2 lb (0.5-0.9 kg). This usually means that your daily calorie intake should be reduced by 500-750 calories. °· Eating 1,200 - 1,500 calories per day can help most women lose weight. °· Eating 1,500 - 1,800 calories per day can help most men lose weight. °What is my plan? °My goal is to have __________ calories per day. °If I have this many calories per day, I should lose around __________ pounds per week. °What do I need to know about calorie counting? °In order to meet your daily calorie goal, you will need to: °· Find out how many calories are in each food you would like to eat. Try to do this before you eat. °· Decide how much of the food you plan to eat. °· Write down what you ate and how many calories it had. Doing this is called keeping a food log. °To successfully lose weight, it is important to balance calorie counting with a healthy lifestyle that includes regular activity. Aim for 150 minutes of moderate exercise (such as walking) or 75  minutes of vigorous exercise (such as running) each week. °Where do I find calorie information? ° °The number of calories in a food can be found on a Nutrition Facts label. If a food does not have a Nutrition Facts label, try to look up the calories online or ask your dietitian for help. °Remember that calories are listed per serving. If you choose to have more than one serving of a food, you will have to multiply the calories per serving by the amount of servings you plan to eat. For example, the label on a package of bread might say that a serving size is 1 slice and that there are 90 calories in a serving. If you eat 1 slice, you will have eaten 90 calories. If you eat 2 slices, you will have eaten 180 calories. °How do I keep a food log? °Immediately after each meal, record the following information in your food log: °· What you ate. Don't forget to include toppings, sauces, and other extras on the food. °· How much you ate. This can be measured in cups, ounces, or number of items. °· How many calories each food and drink had. °· The total number of calories in the meal. °Keep your food log near you, such as in a small notebook in your pocket, or use a mobile app or website. Some programs will   calculate calories for you and show you how many calories you have left for the day to meet your goal. °What are some calorie counting tips? ° °· Use your calories on foods and drinks that will fill you up and not leave you hungry: °? Some examples of foods that fill you up are nuts and nut butters, vegetables, lean proteins, and high-fiber foods like whole grains. High-fiber foods are foods with more than 5 g fiber per serving. °? Drinks such as sodas, specialty coffee drinks, alcohol, and juices have a lot of calories, yet do not fill you up. °· Eat nutritious foods and avoid empty calories. Empty calories are calories you get from foods or beverages that do not have many vitamins or protein, such as candy, sweets, and  soda. It is better to have a nutritious high-calorie food (such as an avocado) than a food with few nutrients (such as a bag of chips). °· Know how many calories are in the foods you eat most often. This will help you calculate calorie counts faster. °· Pay attention to calories in drinks. Low-calorie drinks include water and unsweetened drinks. °· Pay attention to nutrition labels for "low fat" or "fat free" foods. These foods sometimes have the same amount of calories or more calories than the full fat versions. They also often have added sugar, starch, or salt, to make up for flavor that was removed with the fat. °· Find a way of tracking calories that works for you. Get creative. Try different apps or programs if writing down calories does not work for you. °What are some portion control tips? °· Know how many calories are in a serving. This will help you know how many servings of a certain food you can have. °· Use a measuring cup to measure serving sizes. You could also try weighing out portions on a kitchen scale. With time, you will be able to estimate serving sizes for some foods. °· Take some time to put servings of different foods on your favorite plates, bowls, and cups so you know what a serving looks like. °· Try not to eat straight from a bag or box. Doing this can lead to overeating. Put the amount you would like to eat in a cup or on a plate to make sure you are eating the right portion. °· Use smaller plates, glasses, and bowls to prevent overeating. °· Try not to multitask (for example, watch TV or use your computer) while eating. If it is time to eat, sit down at a table and enjoy your food. This will help you to know when you are full. It will also help you to be aware of what you are eating and how much you are eating. °What are tips for following this plan? °Reading food labels °· Check the calorie count compared to the serving size. The serving size may be smaller than what you are used to  eating. °· Check the source of the calories. Make sure the food you are eating is high in vitamins and protein and low in saturated and trans fats. °Shopping °· Read nutrition labels while you shop. This will help you make healthy decisions before you decide to purchase your food. °· Make a grocery list and stick to it. °Cooking °· Try to cook your favorite foods in a healthier way. For example, try baking instead of frying. °· Use low-fat dairy products. °Meal planning °· Use more fruits and vegetables. Half of your plate should be   fruits and vegetables. °· Include lean proteins like poultry and fish. °How do I count calories when eating out? °· Ask for smaller portion sizes. °· Consider sharing an entree and sides instead of getting your own entree. °· If you get your own entree, eat only half. Ask for a box at the beginning of your meal and put the rest of your entree in it so you are not tempted to eat it. °· If calories are listed on the menu, choose the lower calorie options. °· Choose dishes that include vegetables, fruits, whole grains, low-fat dairy products, and lean protein. °· Choose items that are boiled, broiled, grilled, or steamed. Stay away from items that are buttered, battered, fried, or served with cream sauce. Items labeled "crispy" are usually fried, unless stated otherwise. °· Choose water, low-fat milk, unsweetened iced tea, or other drinks without added sugar. If you want an alcoholic beverage, choose a lower calorie option such as a glass of wine or light beer. °· Ask for dressings, sauces, and syrups on the side. These are usually high in calories, so you should limit the amount you eat. °· If you want a salad, choose a garden salad and ask for grilled meats. Avoid extra toppings like bacon, cheese, or fried items. Ask for the dressing on the side, or ask for olive oil and vinegar or lemon to use as dressing. °· Estimate how many servings of a food you are given. For example, a serving of  cooked rice is ½ cup or about the size of half a baseball. Knowing serving sizes will help you be aware of how much food you are eating at restaurants. The list below tells you how big or small some common portion sizes are based on everyday objects: °? 1 oz--4 stacked dice. °? 3 oz--1 deck of cards. °? 1 tsp--1 die. °? 1 Tbsp--½ a ping-pong ball. °? 2 Tbsp--1 ping-pong ball. °? ½ cup--½ baseball. °? 1 cup--1 baseball. °Summary °· Calorie counting means keeping track of how many calories you eat and drink each day. If you eat fewer calories than your body needs, you should lose weight. °· A healthy amount of weight to lose per week is usually 1-2 lb (0.5-0.9 kg). This usually means reducing your daily calorie intake by 500-750 calories. °· The number of calories in a food can be found on a Nutrition Facts label. If a food does not have a Nutrition Facts label, try to look up the calories online or ask your dietitian for help. °· Use your calories on foods and drinks that will fill you up, and not on foods and drinks that will leave you hungry. °· Use smaller plates, glasses, and bowls to prevent overeating. °This information is not intended to replace advice given to you by your health care provider. Make sure you discuss any questions you have with your health care provider. °Document Released: 03/11/2005 Document Revised: 11/28/2017 Document Reviewed: 02/09/2016 °Elsevier Patient Education © 2020 [employer] ° °

## 2019-02-16 ENCOUNTER — Telehealth: Payer: Self-pay | Admitting: Gastroenterology

## 2019-02-16 NOTE — Telephone Encounter (Signed)
Dr Silverio Decamp, Can I schedule her as a direct colonoscopy at the hospital once the schedule is available? Spoke with the patient.

## 2019-02-16 NOTE — Telephone Encounter (Signed)
We received a referral for this patient for a colonoscopy- Called her to schedule but it looks like she was scheduled at the beginning of the year for a colonoscopy at the hospital and it was cancelled due to [place]. She is wanting to get this rescheduled.

## 2019-02-17 NOTE — Telephone Encounter (Signed)
Okay to schedule next available appointment for direct procedure at hospital.  Thank you

## 2019-02-22 ENCOUNTER — Other Ambulatory Visit: Payer: Self-pay | Admitting: Pharmacist

## 2019-02-22 DIAGNOSIS — E1159 Type 2 diabetes mellitus with other circulatory complications: Secondary | ICD-10-CM

## 2019-02-22 MED ORDER — ACCU-CHEK GUIDE VI STRP: ORAL_STRIP | 6 refills | Status: AC

## 2019-02-22 MED ORDER — ACCU-CHEK GUIDE ME W/DEVICE KIT: 1.0000 | PACK | Freq: Every day | 0 refills | Status: AC

## 2019-02-22 MED ORDER — ACCU-CHEK FASTCLIX LANCETS MISC: 6 refills | Status: AC

## 2019-03-01 ENCOUNTER — Other Ambulatory Visit: Payer: Self-pay

## 2019-03-01 DIAGNOSIS — Z1211 Encounter for screening for malignant neoplasm of colon: Secondary | ICD-10-CM

## 2019-03-01 NOTE — Telephone Encounter (Signed)
Spoke with the patient. Mailed instructions to her. She states she feels confident with her ability to prep. She has had instruction before. 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 She has the Suprep kit from her previously scheduled colonoscopy that was cancelled due to the pandemic. Covid test 04/02/19 at 1:30 pm Colon at [facility] 04/06/19 at 9:15 am.

## 2019-03-22 MED FILL — glipiZIDE 5 MG TABS: 5 | 90 days supply | Qty: 90 | Fill #0

## 2019-03-30 NOTE — Progress Notes (Signed)
Pre-op endo call completed  

## 2019-04-02 ENCOUNTER — Other Ambulatory Visit: Payer: Self-pay | Admitting: Gastroenterology

## 2019-04-02 ENCOUNTER — Other Ambulatory Visit (HOSPITAL_COMMUNITY): Admission: RE | Admit: 2019-04-02 | Payer: Medicare Other | Source: Ambulatory Visit

## 2019-04-02 DIAGNOSIS — Z1159 Encounter for screening for other viral diseases: Secondary | ICD-10-CM | POA: Diagnosis not present

## 2019-04-05 LAB — SARS CORONAVIRUS 2 (TAT 6-24 HRS): SARS Coronavirus 2: NEGATIVE

## 2019-04-06 ENCOUNTER — Ambulatory Visit (HOSPITAL_COMMUNITY)
Admission: RE | Admit: 2019-04-06 | Discharge: 2019-04-06 | Disposition: A | Discharge status: Home / Self Care | Payer: Medicare Other | Attending: Gastroenterology | Admitting: Gastroenterology

## 2019-04-06 ENCOUNTER — Encounter (HOSPITAL_COMMUNITY): Payer: Self-pay | Admitting: Gastroenterology

## 2019-04-06 ENCOUNTER — Other Ambulatory Visit: Payer: Self-pay

## 2019-04-06 ENCOUNTER — Encounter (HOSPITAL_COMMUNITY)
Admission: RE | Disposition: A | Discharge status: Home / Self Care | Payer: Self-pay | Source: Home / Self Care | Attending: Gastroenterology

## 2019-04-06 ENCOUNTER — Ambulatory Visit (HOSPITAL_COMMUNITY): Payer: Medicare Other | Admitting: Anesthesiology

## 2019-04-06 DIAGNOSIS — K648 Other hemorrhoids: Secondary | ICD-10-CM | POA: Diagnosis not present

## 2019-04-06 DIAGNOSIS — Z79899 Other long term (current) drug therapy: Secondary | ICD-10-CM | POA: Diagnosis not present

## 2019-04-06 DIAGNOSIS — K635 Polyp of colon: Secondary | ICD-10-CM

## 2019-04-06 DIAGNOSIS — Z1211 Encounter for screening for malignant neoplasm of colon: Secondary | ICD-10-CM | POA: Diagnosis not present

## 2019-04-06 DIAGNOSIS — K921 Melena: Secondary | ICD-10-CM | POA: Diagnosis not present

## 2019-04-06 DIAGNOSIS — Z7984 Long term (current) use of oral hypoglycemic drugs: Secondary | ICD-10-CM | POA: Insufficient documentation

## 2019-04-06 DIAGNOSIS — I110 Hypertensive heart disease with heart failure: Secondary | ICD-10-CM | POA: Diagnosis not present

## 2019-04-06 DIAGNOSIS — D120 Benign neoplasm of cecum: Secondary | ICD-10-CM | POA: Diagnosis not present

## 2019-04-06 DIAGNOSIS — Z8673 Personal history of transient ischemic attack (TIA), and cerebral infarction without residual deficits: Secondary | ICD-10-CM | POA: Diagnosis not present

## 2019-04-06 DIAGNOSIS — I5030 Unspecified diastolic (congestive) heart failure: Secondary | ICD-10-CM | POA: Diagnosis not present

## 2019-04-06 DIAGNOSIS — E669 Obesity, unspecified: Secondary | ICD-10-CM | POA: Diagnosis not present

## 2019-04-06 DIAGNOSIS — K625 Hemorrhage of anus and rectum: Secondary | ICD-10-CM

## 2019-04-06 DIAGNOSIS — G4733 Obstructive sleep apnea (adult) (pediatric): Secondary | ICD-10-CM | POA: Diagnosis not present

## 2019-04-06 DIAGNOSIS — E119 Type 2 diabetes mellitus without complications: Secondary | ICD-10-CM | POA: Diagnosis not present

## 2019-04-06 DIAGNOSIS — Z6841 Body Mass Index (BMI) 40.0 and over, adult: Secondary | ICD-10-CM | POA: Diagnosis not present

## 2019-04-06 HISTORY — PX: COLONOSCOPY WITH PROPOFOL: SHX5780

## 2019-04-06 HISTORY — PX: POLYPECTOMY: SHX5525

## 2019-04-06 LAB — GLUCOSE, CAPILLARY: Glucose-Capillary: 104 mg/dL — ABNORMAL HIGH (ref 70–99)

## 2019-04-06 SURGERY — COLONOSCOPY WITH PROPOFOL
Anesthesia: Monitor Anesthesia Care

## 2019-04-06 MED ORDER — SODIUM CHLORIDE 0.9 % IV SOLN: INTRAVENOUS | Status: DC

## 2019-04-06 MED ORDER — LISINOPRIL 10 MG PO TABS
10.0000 mg | ORAL_TABLET | ORAL | Status: AC
  Administered 2019-04-06: 10 mg via ORAL
  Filled 2019-04-06 (×3): qty 1

## 2019-04-06 MED ORDER — PROPOFOL 10 MG/ML IV BOLUS
INTRAVENOUS | Status: DC | PRN
  Administered 2019-04-06: 20 mg via INTRAVENOUS

## 2019-04-06 MED ORDER — LIDOCAINE HCL 1 % IJ SOLN
INTRAMUSCULAR | Status: DC | PRN
  Administered 2019-04-06 (×2): 40 mg via INTRADERMAL

## 2019-04-06 MED ORDER — PROPOFOL 500 MG/50ML IV EMUL
INTRAVENOUS | Status: DC | PRN
  Administered 2019-04-06: 125 ug/kg/min via INTRAVENOUS

## 2019-04-06 MED ORDER — LACTATED RINGERS IV SOLN: INTRAVENOUS | Status: DC

## 2019-04-06 MED ORDER — ESMOLOL HCL 100 MG/10ML IV SOLN
INTRAVENOUS | Status: DC | PRN
  Administered 2019-04-06: 25 ug via INTRAVENOUS

## 2019-04-06 MED ORDER — DIPHENHYDRAMINE HCL 50 MG/ML IJ SOLN
INTRAMUSCULAR | Status: DC | PRN
  Administered 2019-04-06: 25 mg via INTRAVENOUS

## 2019-04-06 MED ORDER — PROPOFOL 500 MG/50ML IV EMUL
INTRAVENOUS | Status: AC
  Filled 2019-04-06: qty 50

## 2019-04-06 SURGICAL SUPPLY — 22 items

## 2019-04-06 NOTE — Transfer of Care (Signed)
Immediate Anesthesia Transfer of Care Note  Patient: [name]  Procedure(s) Performed: COLONOSCOPY WITH PROPOFOL (N/A ) POLYPECTOMY  Patient Location: Endoscopy Unit  Anesthesia Type:MAC  Level of Consciousness: drowsy, patient cooperative and responds to stimulation  Airway & Oxygen Therapy: Patient Spontanous Breathing and Patient connected to face mask oxygen  Post-op Assessment: Report given to RN and Post -op Vital signs reviewed and stable  Post vital signs: Reviewed and stable  Last Vitals:  Vitals Value Taken Time  BP 156/90 04/06/19 1046  Temp    Pulse 77 04/06/19 1049  Resp 26 04/06/19 1049  SpO2 100 % 04/06/19 1049  Vitals shown include unvalidated device data.  Last Pain:  Vitals:   04/06/19 0936  TempSrc: Oral  PainSc: 0-No pain         Complications: No apparent anesthesia complications

## 2019-04-06 NOTE — H&P (Signed)
[facility] History and Physical   Primary Care Physician:  Charlott Rakes, MD   Reason for Procedure:   Rectal bleeding, colorectal cancer screening  Plan:    Colonoscopy with possible intervention     HPI: [name] is a 59 y.o. female here for colonoscopy for evaluation intermittent rectal bleeding. Never had colonoscopy or colorectal cancer screening.    Past Medical History:  Diagnosis Date  . Headache   . Hypertension   . Iron deficiency anemia   . Obesity   . Seizures ([place])   . Stroke ([place])   . Type 2 diabetes mellitus ([place])     Past Surgical History:  Procedure Laterality Date  . CARDIAC CATHETERIZATION  04/2010   Dr. Terrence Dupont    Prior to Admission medications   Medication Sig Start Date End Date Taking? Authorizing Provider  acetaminophen (TYLENOL) 500 MG tablet Take 500-1,000 mg by mouth every 6 (six) hours as needed for headache (pain).    Yes [provider]  atorvastatin (LIPITOR) 20 MG tablet Take 1 tablet (20 mg total) by mouth daily. 02/15/19  Yes Charlott Rakes, MD  cetirizine (ZYRTEC) 10 MG tablet Take 10 mg by mouth daily as needed for allergies.    Yes [provider]  diclofenac Sodium (VOLTAREN) 1 % GEL Apply 1 application topically 4 (four) times daily as needed (pain.). OTC   Yes [provider]  diphenhydrAMINE (BENADRYL) 25 MG tablet Take 25 mg by mouth at bedtime.    Yes [provider]  fluticasone (FLONASE) 50 MCG/ACT nasal spray Place 1 spray into both nostrils daily as needed for allergies or rhinitis. Patient taking differently: Place 1 spray into both nostrils at bedtime.  10/20/18  Yes Charlott Rakes, MD  glipiZIDE (GLUCOTROL) 5 MG tablet Take 0.5 tablets (2.5 mg total) by mouth 2 (two) times daily before a meal. 02/15/19  Yes Newlin, Enobong, MD  hydroxypropyl methylcellulose / hypromellose (ISOPTO TEARS / GONIOVISC) 2.5 % ophthalmic solution Place 2 drops into both eyes as needed  (irritation). Patient taking differently: Place 2 drops into both eyes 4 (four) times daily as needed (irritation).  01/25/19  Yes Newlin, Charlane Ferretti, MD  levETIRAcetam (KEPPRA) 500 MG tablet TAKE 1 & 1/2 TABLETS BY MOUTH TWICE DAILY. Patient taking differently: Take 750 mg by mouth 2 (two) times daily.  04/17/18  Yes Dennie Bible, NP  lidocaine (LIDODERM) 5 % Place 1 patch onto the skin daily. Remove & Discard patch within 12 hours or as directed by MD Patient taking differently: Place 1 patch onto the skin daily as needed (PAIN.). Remove & Discard patch within 12 hours or as directed by MD 03/01/18  Yes Maczis, Barth Kirks, PA-C  lisinopril (ZESTRIL) 10 MG tablet Take 1 tablet (10 mg total) by mouth daily. 02/15/19  Yes Charlott Rakes, MD  meclizine (ANTIVERT) 25 MG tablet TAKE 1 TABLET (25 MG TOTAL) BY MOUTH 2 (TWO) TIMES DAILY AS NEEDED FOR DIZZINESS. Patient taking differently: Take 25 mg by mouth at bedtime.  07/07/18  Yes Ladell Pier, MD  metFORMIN (GLUCOPHAGE XR) 500 MG 24 hr tablet Take 2 tablets (1,000 mg total) by mouth 2 (two) times daily. 02/15/19  Yes Newlin, Charlane Ferretti, MD  methocarbamol (ROBAXIN) 750 MG tablet TAKE 1 TABLET BY MOUTH 2 TIMES DAILY AS NEEDED FOR MUSCLE SPASMS. Patient taking differently: Take 750 mg by mouth 2 (two) times daily as needed (spasms).  02/27/18  Yes Jamse Arn, MD  metoprolol tartrate (LOPRESSOR) 100 MG  tablet Take 1 tablet (100 mg total) by mouth 2 (two) times daily. 02/15/19  Yes Charlott Rakes, MD  Accu-Chek FastClix Lancets MISC Check blood sugar up to 3 times daily. E11.59 02/22/19   Charlott Rakes, MD  Blood Glucose Monitoring Suppl (ACCU-CHEK GUIDE ME) w/Device KIT 1 kit by Does not apply route daily. Check blood sugar up to 3 times daily. E11.59 02/22/19   Charlott Rakes, MD  glucose blood (ACCU-CHEK GUIDE) test strip Check blood sugar up to 3 times daily. E11.59 02/22/19   Charlott Rakes, MD  Na Sulfate-K Sulfate-Mg Sulf  17.5-3.13-1.6 GM/177ML SOLN 1 suprep kit for colonoscopy 05/04/18   Brytney Somes, Venia Minks, MD  predniSONE (STERAPRED UNI-PAK 21 TAB) 10 MG (21) TBPK tablet 6 tabs for 1 day, then 5 tabs for 1 das, then 4 tabs for 1 day, then 3 tabs for 1 day, 2 tabs for 1 day, then 1 tab for 1 day Patient not taking: Reported on 02/15/2019 11/27/18   Orvan July, NP    Current Facility-Administered Medications  Medication Dose Route Frequency Provider Last Rate Last Admin  . 0.9 %  sodium chloride infusion   Intravenous Continuous [place] Mckeough V, MD      . lactated ringers infusion   Intravenous Continuous Mauri Pole, MD        Allergies as of 03/01/2019 - Review Complete 02/15/2019  Allergen Reaction Noted  . Bee venom Swelling 04/21/2017  . Eggs or egg-derived products Nausea And Vomiting and Swelling 11/29/2015  . Gabapentin  09/10/2016  . Gadolinium derivatives Itching 12/12/2015    Family History  Problem Relation Age of Onset  . Diabetes Father   . Hypertension Father   . Hypertension Sister   . Stroke Brother     Social History   Socioeconomic History  . Marital status: Married    Spouse name: Not on file  . Number of children: Not on file  . Years of education: Not on file  . Highest education level: Not on file  Occupational History  . Not on file  Tobacco Use  . Smoking status: Never Smoker  . Smokeless tobacco: Never Used  Substance and Sexual Activity  . Alcohol use: No  . Drug use: No  . Sexual activity: Not Currently  Other Topics Concern  . Not on file  Social History Narrative  . Not on file   Social Determinants of Health   Financial Resource Strain:   . Difficulty of Paying Living Expenses: Not on file  Food Insecurity:   . Worried About Charity fundraiser in the Last Year: Not on file  . Ran Out of Food in the Last Year: Not on file  Transportation Needs:   . Lack of Transportation (Medical): Not on file  . Lack of Transportation (Non-Medical):  Not on file  Physical Activity:   . Days of Exercise per Week: Not on file  . Minutes of Exercise per Session: Not on file  Stress:   . Feeling of Stress : Not on file  Social Connections:   . Frequency of Communication with Friends and Family: Not on file  . Frequency of Social Gatherings with Friends and Family: Not on file  . Attends Religious Services: Not on file  . Active Member of Clubs or Organizations: Not on file  . Attends Archivist Meetings: Not on file  . Marital Status: Not on file  Intimate Partner Violence:   . Fear of Current or Ex-Partner: Not  on file  . Emotionally Abused: Not on file  . Physically Abused: Not on file  . Sexually Abused: Not on file    Review of Systems:  All other review of systems negative except as mentioned in the HPI.  Physical Exam: Vital signs in last 24 hours:     General:   Alert,  Well-developed, well-nourished, pleasant and cooperative in NAD Lungs:  Clear throughout to auscultation.   Heart:  Regular rate and rhythm; no murmurs, clicks, rubs,  or gallops. Abdomen:  Soft, nontender and nondistended. Normal bowel sounds.   Neuro/Psych:  Alert and cooperative. Normal mood and affect. A and O x 3   K. Denzil Magnuson , MD [phone]

## 2019-04-06 NOTE — Anesthesia Postprocedure Evaluation (Signed)
Anesthesia Post Note  Patient: [name]  Procedure(s) Performed: COLONOSCOPY WITH PROPOFOL (N/A ) POLYPECTOMY     Patient location during evaluation: PACU Anesthesia Type: MAC Level of consciousness: awake and alert Pain management: pain level controlled Vital Signs Assessment: post-procedure vital signs reviewed and stable Respiratory status: spontaneous breathing, nonlabored ventilation and respiratory function stable Cardiovascular status: blood pressure returned to baseline and stable Postop Assessment: no apparent nausea or vomiting Anesthetic complications: no Comments: Hypertensive at baseline, still elevated Bps. Will give her home lisinopril dose and D/C to home with instructions to recheck her BP at home    Last Vitals:  Vitals:   04/06/19 1100 04/06/19 1120  BP: (!) 177/88 (!) 169/105  Pulse:  68  Resp:  (!) 23  Temp:    SpO2:  98%    Last Pain:  Vitals:   04/06/19 1120  TempSrc:   PainSc: 0-No pain                 Pervis Hocking

## 2019-04-06 NOTE — Progress Notes (Signed)
Patient b/p eleveated to over [id number] systolic and 90 diastolic.  Per anesthesia md patients home dose of po lisinopril 10 mg was ordered and given to patient prior to discharge.  Patient was instructed to recheck b/p this afternoon and contact family md if still elevated.

## 2019-04-06 NOTE — Discharge Instructions (Signed)

## 2019-04-06 NOTE — Op Note (Signed)
[facility] Patient Name: [name] Procedure Date: 04/06/2019 MRN: [id number] Attending MD: Mauri Pole , MD Date of Birth: [birth year] CSN: [id number] Age: 59 Admit Type: Outpatient Procedure:                Colonoscopy Indications:              Evaluation of unexplained GI bleeding presenting                            with Hematochezia Providers:                Mauri Pole, MD, Cleda Daub, RN, Elspeth Cho Tech., Technician, Corie Chiquito,                            Technician, Karis Juba, CRNA Referring MD:              Medicines:                Monitored Anesthesia Care Complications:            No immediate complications. Estimated Blood Loss:     Estimated blood loss was minimal. Procedure:                Pre-Anesthesia Assessment:                           - Prior to the procedure, a History and Physical                            was performed, and patient medications and                            allergies were reviewed. The patient's tolerance of                            previous anesthesia was also reviewed. The risks                            and benefits of the procedure and the sedation                            options and risks were discussed with the patient.                            All questions were answered, and informed consent                            was obtained. Prior Anticoagulants: The patient has                            taken no previous anticoagulant or antiplatelet                            agents. ASA Grade  Assessment: III - A patient with                            severe systemic disease. After reviewing the risks                            and benefits, the patient was deemed in                            satisfactory condition to undergo the procedure.                           After obtaining informed consent, the colonoscope                            was passed  under direct vision. Throughout the                            procedure, the patient's blood pressure, pulse, and                            oxygen saturations were monitored continuously. The                            [id number] Olympus pediatric colonscope                            was introduced through the anus and advanced to the                            the cecum, identified by appendiceal orifice and                            ileocecal valve. The colonoscopy was performed                            without difficulty. The patient tolerated the                            procedure well. The quality of the bowel                            preparation was excellent. The ileocecal valve,                            appendiceal orifice, and rectum were photographed. Scope In: 10:22:14 AM Scope Out: 10:38:35 AM Scope Withdrawal Time: 0 hours 10 minutes 55 seconds  Total Procedure Duration: 0 hours 16 minutes 21 seconds  Findings:      The perianal and digital rectal examinations were normal.      A 4 mm polyp was found in the ileocecal valve. The polyp was sessile.       The polyp was removed with a cold snare. Resection and retrieval were       complete.  Non-bleeding internal hemorrhoids were found during retroflexion. The       hemorrhoids were small. Impression:               - One 4 mm polyp at the ileocecal valve, removed                            with a cold snare. Resected and retrieved.                           - Non-bleeding internal hemorrhoids. Moderate Sedation:      Not Applicable - Patient had care per Anesthesia. Recommendation:           - Patient has a contact number available for                            emergencies. The signs and symptoms of potential                            delayed complications were discussed with the                            patient. Return to normal activities tomorrow.                            Written discharge  instructions were provided to the                            patient.                           - Resume previous diet.                           - Continue present medications.                           - Await pathology results.                           - Repeat colonoscopy in 5-10 years for surveillance                            based on pathology results. Procedure Code(s):        --- Professional ---                           [phone], Colonoscopy, flexible; with removal of                            tumor(s), polyp(s), or other lesion(s) by snare                            technique Diagnosis Code(s):        --- Professional ---  K63.5, Polyp of colon                           K64.8, Other hemorrhoids                           K92.1, Melena (includes Hematochezia) CPT copyright 2019 American [employer] Association. All rights reserved. The codes documented in this report are preliminary and upon coder review may  be revised to meet current compliance requirements. Mauri Pole, MD 04/06/2019 10:50:36 AM This report has been signed electronically. Number of Addenda: 0

## 2019-04-06 NOTE — Anesthesia Preprocedure Evaluation (Addendum)
Anesthesia Evaluation  Patient identified by MRN, date of birth, ID band Patient awake    Reviewed: Allergy & Precautions, NPO status , Patient's Chart, lab work & pertinent test results  Airway Mallampati: II  TM Distance: >3 FB Neck ROM: Full    Dental  (+) Dental Advisory Given, Chipped, Poor Dentition,    Pulmonary asthma , sleep apnea and Continuous Positive Airway Pressure Ventilation ,  Severe OSA- last seen 12/2018 by pulmonology. Uses CPAP 25   Pulmonary exam normal breath sounds clear to auscultation       Cardiovascular hypertension, Pt. on home beta blockers and Pt. on medications +CHF  Normal cardiovascular exam Rhythm:Regular Rate:Normal  Echo 2017: Left ventricle: The cavity size was normal. There was moderate concentric hypertrophy. Systolic function was normal. The estimated ejection fraction was in the range of 60% to 65%. Wall motion was normal; there were no regional wall motion abnormalities. Features are consistent with a pseudonormal left ventricular filling pattern, with concomitant abnormal relaxation and increased filling pressure (grade 2 diastolic dysfunction). Doppler parameters are consistent with elevated ventricular end-diastolic filling pressure. - Aortic valve: Trileaflet; mildly thickened, mildly calcified   leaflets. There was no regurgitation. - Aortic root: The aortic root was normal in size. - Mitral valve: Structurally normal valve. There was trivial   regurgitation. - Left atrium: The atrium was normal in size. - Right ventricle: The cavity size was normal. Wall thickness was normal. Systolic function was normal. - Right atrium: The atrium was normal in size. - Tricuspid valve: There was trivial regurgitation. - Pulmonic valve: There was no regurgitation. - Pulmonary arteries: Systolic pressure was within the normal range. - Inferior vena cava: The vessel was dilated. The respirophasic  diameter changes were blunted (< 50%), consistent with elevated central venous pressure. - Pericardium, extracardiac: There was no pericardial effusion.   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 CVA negative psych ROS   GI/Hepatic negative GI ROS, Neg liver ROS,   Endo/Other  diabetes, Poorly Controlled, Type 2, Oral Hypoglycemic AgentsMorbid obesity Morbid obesity BMI 60 A1c 6.2  Renal/GU negative Renal ROS  negative genitourinary   Musculoskeletal negative musculoskeletal ROS (+)   Abdominal (+) + obese,   Peds negative pediatric ROS (+)  Hematology  (+) anemia ,   Anesthesia Other Findings HLD  Morbidly obese, ambulates minimally with walker, very deconditioned  Reproductive/Obstetrics negative OB ROS                           Anesthesia Physical Anesthesia Plan  ASA: III  Anesthesia Plan: MAC   Post-op Pain Management:    Induction:   PONV Risk Score and Plan: TIVA and Propofol infusion  Airway Management Planned: Natural Airway and Simple Face Mask  Additional Equipment: None  Intra-op Plan:   Post-operative Plan:   Informed Consent: I have reviewed the patients History and Physical, chart, labs and discussed the procedure including the risks, benefits and alternatives for the proposed anesthesia with the patient or authorized representative who has indicated his/her understanding and acceptance.       Plan Discussed with: CRNA  Anesthesia Plan Comments: (Severe OSA on very high settings of CPAP at home- likelihood of obstruction upon sedation; GA/LMA as backup)       Anesthesia Quick Evaluation

## 2019-04-07 ENCOUNTER — Encounter: Payer: Self-pay | Admitting: *Deleted

## 2019-04-07 LAB — SURGICAL PATHOLOGY

## 2019-04-13 ENCOUNTER — Encounter: Payer: Self-pay | Admitting: Gastroenterology

## 2019-05-12 MED FILL — LISINOPRIL 10 MG TABS: 10 | 30 days supply | Qty: 30 | Fill #1

## 2019-05-12 MED FILL — ATORVASTATIN CALCIUM 20 MG: 20 | 30 days supply | Qty: 30 | Fill #1

## 2019-05-12 MED FILL — METOPROLOL TARTRATE 100 MG: 100 | 30 days supply | Qty: 60 | Fill #1

## 2019-05-13 DIAGNOSIS — G4733 Obstructive sleep apnea (adult) (pediatric): Secondary | ICD-10-CM | POA: Diagnosis not present

## 2019-05-18 ENCOUNTER — Ambulatory Visit: Payer: Medicare Other | Admitting: Family Medicine

## 2019-06-07 ENCOUNTER — Encounter: Payer: Medicare Other | Admitting: Physical Medicine & Rehabilitation

## 2019-06-10 ENCOUNTER — Ambulatory Visit: Payer: Medicare Other | Admitting: Physical Medicine & Rehabilitation

## 2019-06-16 MED FILL — FLUTICASONE PROP 50 MCG SPR: 50 | 30 days supply | Qty: 16 | Fill #1

## 2019-06-24 ENCOUNTER — Other Ambulatory Visit: Payer: Self-pay

## 2019-06-24 ENCOUNTER — Encounter: Payer: Self-pay | Admitting: Physical Medicine & Rehabilitation

## 2019-06-24 ENCOUNTER — Encounter: Payer: Medicare Other | Attending: Physical Medicine & Rehabilitation | Admitting: Physical Medicine & Rehabilitation

## 2019-06-24 VITALS — BP 168/108 | HR 95 | Temp 97.7°F | Ht 59.0 in | Wt 294.0 lb

## 2019-06-24 DIAGNOSIS — M19019 Primary osteoarthritis, unspecified shoulder: Secondary | ICD-10-CM | POA: Diagnosis not present

## 2019-06-24 DIAGNOSIS — E236 Other disorders of pituitary gland: Secondary | ICD-10-CM | POA: Diagnosis not present

## 2019-06-24 DIAGNOSIS — R350 Frequency of micturition: Secondary | ICD-10-CM | POA: Diagnosis not present

## 2019-06-24 DIAGNOSIS — I10 Essential (primary) hypertension: Secondary | ICD-10-CM

## 2019-06-24 DIAGNOSIS — R269 Unspecified abnormalities of gait and mobility: Secondary | ICD-10-CM | POA: Diagnosis not present

## 2019-06-24 DIAGNOSIS — I69398 Other sequelae of cerebral infarction: Secondary | ICD-10-CM

## 2019-06-24 DIAGNOSIS — I611 Nontraumatic intracerebral hemorrhage in hemisphere, cortical: Secondary | ICD-10-CM

## 2019-06-24 MED ORDER — METHOCARBAMOL 750 MG PO TABS: ORAL_TABLET | ORAL | 1 refills | Status: DC

## 2019-06-24 NOTE — Progress Notes (Signed)
Subjective:    Patient ID: [name], female    DOB: 03-Apr-1960, 59 y.o.   MRN: [id number]  HPI  RH-female with history of morbid obesity, HTN, iron deficiency anemia who presents for follow up after right temporal intracranial hemorrhage.  Last clinic visit 12/08/2018.  Since that time, patient went to the hospital for colonoscopy and elevated blood pressure.  Patient states, she has not gone to the [facility], but is ambulating with her daughter. She notes improvement in depression. Denies seizures. Denies falls. States she lost 3 lbs since last visit.  Spasms are on/off. She notes on/off pain around her AC joint.  BP is elevated. States is normally controlled at home. She never followed up with Endo. She continues to have urinary freq at night.  She has not limited fluid intake at night and keep water next to her bed at night.  Main complaint is pain over AC joint.   Pain Inventory Average Pain  6 Pain Right Now 5 My pain is sharp, burning, stabbing and aching  In the last 24 hours, has pain interfered with the following? General activity 4 Relation with others 5 Enjoyment of life 5 What TIME of day is your pain at its worst? evening night Sleep (in general) Fair  Pain is worse with: some activites Pain improves with: rest and medication Relief from Meds: 7  Mobility walk with assistance use a cane ability to climb steps?  no do you drive?  yes transfers alone  Function disabled: date disabled . I need assistance with the following:  household duties and shopping  Neuro/Psych trouble walking dizziness depression  Prior Studies Any changes since last visit?  no  Physicians involved in your care Any changes since last visit?  no   Family History  Problem Relation Age of Onset  . Diabetes Father   . Hypertension Father   . Hypertension Sister   . Stroke Brother    Social History   Socioeconomic History  . Marital status: Married    Spouse name: Not on file   . Number of children: Not on file  . Years of education: Not on file  . Highest education level: Not on file  Occupational History  . Not on file  Tobacco Use  . Smoking status: Never Smoker  . Smokeless tobacco: Never Used  Substance and Sexual Activity  . Alcohol use: No  . Drug use: No  . Sexual activity: Not Currently  Other Topics Concern  . Not on file  Social History Narrative  . Not on file   Social Determinants of Health   Financial Resource Strain:   . Difficulty of Paying Living Expenses:   Food Insecurity:   . Worried About Charity fundraiser in the Last Year:   . Arboriculturist in the Last Year:   Transportation Needs:   . Film/video editor (Medical):   Marland Kitchen Lack of Transportation (Non-Medical):   Physical Activity:   . Days of Exercise per Week:   . Minutes of Exercise per Session:   Stress:   . Feeling of Stress :   Social Connections:   . Frequency of Communication with Friends and Family:   . Frequency of Social Gatherings with Friends and Family:   . Attends Religious Services:   . Active Member of Clubs or Organizations:   . Attends Archivist Meetings:   Marland Kitchen Marital Status:    Past Surgical History:  Procedure Laterality Date  .  CARDIAC CATHETERIZATION  04/2010   Dr. Terrence Dupont  . COLONOSCOPY WITH PROPOFOL N/A 04/06/2019   Procedure: COLONOSCOPY WITH PROPOFOL;  Surgeon: Mauri Pole, MD;  Location: [facility]COPY;  Service: Endoscopy;  Laterality: N/A;  . POLYPECTOMY  04/06/2019   Procedure: POLYPECTOMY;  Surgeon: Mauri Pole, MD;  Location: [facility]OSCOPY;  Service: Endoscopy;;   Past Medical History:  Diagnosis Date  . Headache   . Hypertension   . Iron deficiency anemia   . Obesity   . Seizures ([place])   . Stroke ([place])   . Type 2 diabetes mellitus (HCC)    BP (!) 168/108   Pulse 95   Temp 97.7 F (36.5 C)   Ht 4\' 11"  (1.499 m)   Wt 294 lb (133.4 kg)   LMP 02/17/2011 (Exact Date)   SpO2 96%   BMI 59.38 kg/m    Opioid Risk Score:   Fall Risk Score:  `1  Depression screen PHQ 2/9  Depression screen [facility] 2/9 02/15/2019 10/20/2018 06/05/2018 04/22/2018 04/22/2018 10/31/2017 10/15/2017  Decreased Interest 0 0 1 2 2 1 2   Down, Depressed, Hopeless 0 0 1 2 2 1 1   PHQ - 2 Score 0 0 2 4 4 2 3   Altered sleeping 0 2 - 2 2 - 2  Tired, decreased energy 0 0 - 2 2 - 1  Change in appetite 0 2 - 2 2 - 1  Feeling bad or failure about yourself  0 0 - 2 2 - 1  Trouble concentrating 0 0 - 0 0 - 0  Moving slowly or fidgety/restless 0 2 - 2 2 - 1  Suicidal thoughts 0 0 - 0 0 - 0  PHQ-9 Score 0 6 - 14 14 - 9  Some recent data might be hidden    Review of Systems  Constitutional: Negative.        Bladder control problems   HENT: Negative.   Eyes: Negative.   Respiratory: Positive for apnea, cough, shortness of breath and wheezing.   Cardiovascular: Positive for leg swelling.  Gastrointestinal: Positive for diarrhea.  Endocrine: Negative.   Genitourinary: Negative.   Musculoskeletal: Positive for arthralgias, back pain and gait problem.       Spasms   Skin: Negative.   Neurological: Positive for weakness.       Tingling    Hematological: Negative.   Psychiatric/Behavioral: Positive for confusion and dysphoric mood.      Objective:   Physical Exam Constitutional: She appears well-developed. NAD. Morbidly obese.  Respiratory: Effort normal.  Musculoskeletal:  Gait: Slow cadence with cane No edema.  +TTP AC joint. Mild pain with right scarf test Neurological: She is Alert  Motor: RUE: 4+/5 throughout. RLE: 4/5 HF, 4/5 distally    Assessment & Plan:  RH-female with history of morbid obesity, HTN, iron deficiency anemia who presents for follow up after right temporal intracranial hemorrhage.  1.S/p right temporal intracranial hemorrhage with dysesthesias  Side effects with gabapentin             Aquatic therapy denied by insurance, encouraged follow up at [facility] x6, pt states she does not like the  water.  Now states ambulating with daughter  MRI previously reviewed - stable infarct with empty sella persistent  Cont meds  Stopped taking Cymbalta, will not restart              2. Depression     Reaction to Prozaac  Pt states she is managing with her chaplain and  scriptures  Stopped taking Cymbalta  3. Seizures             Continue meds, states she needs refills, encouraged follow up with Neurology  4. Morbid obesity  No longer seeing dietitian, states she never received an appointment to follow up, recommended making appointment again x3. Referral made again.             Cont to encourage weight loss again.  5. Muscle spasms             Encouraged ROM and stretching  Continue Robaxin 750 BID PRN  6. Abnormality of gait - post stroke             Continue cane  Therapies completed, cont HEP  Discussed environmental modifications  7. Right shoulder pain/AC - Rotator cuff tendinopathy             Xrays reviewed with some degenerative change right shoulder  Will consider steroid injection in future, she still does not want at present  Encouraged Voltaren gel  Now pain appears to be more in right [facility] joint - will order xray and consider injection if necessary  Patient does not feel need at present  8. Right leg pain  See #5, #7  9. Falls  In sleep  Encouraged pt to push bed against wall or place furniture against bed to limit chances of rolling onto floor  See #6  Improving  10. 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 Urinary frequency  Mainly nocturnal  Encouraged decrease in nighttime intake and increase daytime fluid intake, reminded again  Stable

## 2019-06-28 ENCOUNTER — Other Ambulatory Visit: Payer: Self-pay | Admitting: Internal Medicine

## 2019-06-28 DIAGNOSIS — R42 Dizziness and giddiness: Secondary | ICD-10-CM

## 2019-06-28 MED FILL — METOPROLOL TARTRATE 100 MG: 100 | 30 days supply | Qty: 60 | Fill #2

## 2019-06-28 MED FILL — LISINOPRIL 10 MG TABS: 10 | 30 days supply | Qty: 30 | Fill #2

## 2019-06-28 MED FILL — ATORVASTATIN CALCIUM 20 MG: 20 | 30 days supply | Qty: 30 | Fill #2

## 2019-06-29 MED FILL — MECLIZINE 25 MG TABLET: 25 | 30 days supply | Qty: 60 | Fill #0

## 2019-07-01 ENCOUNTER — Telehealth: Payer: Self-pay

## 2019-07-01 NOTE — Telephone Encounter (Signed)
Pharmacy sent fax stating that Tizanidine is preferred under patient insurance. Methocarbamol was prescribed.

## 2019-07-05 MED ORDER — TIZANIDINE HCL 2 MG PO TABS: 2.0000 mg | ORAL_TABLET | Freq: Three times a day (TID) | ORAL | 0 refills | Status: DC | PRN

## 2019-07-05 NOTE — Telephone Encounter (Signed)
This was medication she was previously taking.  I am not sure if her insurance changed or not.  Nonetheless, we can trial tizanidine 2 mg 3 times daily as needed.  Thanks.

## 2019-07-05 NOTE — Addendum Note (Signed)
Addended by: Caro Hight on: 07/05/2019 04:18 PM   Modules accepted: Orders

## 2019-07-06 MED FILL — tiZANidine HCL 2 MG TABS: 2 | 30 days supply | Qty: 90 | Fill #0

## 2019-08-02 ENCOUNTER — Encounter: Payer: Medicare Other | Attending: Physical Medicine & Rehabilitation | Admitting: Registered"

## 2019-08-02 ENCOUNTER — Encounter: Payer: Self-pay | Admitting: Registered"

## 2019-08-02 ENCOUNTER — Other Ambulatory Visit: Payer: Self-pay

## 2019-08-02 DIAGNOSIS — Z713 Dietary counseling and surveillance: Secondary | ICD-10-CM | POA: Diagnosis not present

## 2019-08-02 NOTE — Patient Instructions (Addendum)
Rethink your drink Consider cutting back on Mtn Dew and other sodas Consider the snack sheet to replace your potato chips

## 2019-08-02 NOTE — Progress Notes (Signed)
Medical Nutrition Therapy:  Appt start time: 1400 end time:  1500.  Assessment:  Primary concerns today: Weight managment.   Patient ambulates slowly with cane and was out of breath walking down the hall from the elevator.   Pt states she has a hard time with getting medical appointments because they are all doing virtual now and she needs to get a new computer. Pt states her phone is too old to use for virtual appointments.  Per chart patient has Type 2 diabetes, A1c 6.2% 02/15/19. Patient states she was diagnosed with pre-diabetes when she had a stroke in 2017 and was not aware of the Type 2 diagnosis and 2 of her medications are for diabetes.  Pt states she checks her blood sugar sometimes, last time was 2 weeks ago and thinks it was 139 mg/dL before bed.  Patient reports she lives with husband and brother. States her brother has seizures but does okay if he can stay in a routine. Pt states her brother has been living with her for last 3 years since his wife died.   Patient states she wakes up at 6:30 am to use the bathroom but goes back to bed until 11:30-12:00 because her leg hurts and feels like she will fall. After she feels more stable patient will get up and cook breakfast/brunch.  Patient states she stopped eating nuts after a MD at the clinic said that might be what was making her feet and legs swell at night.   Pt states her problem is she loves to eat, taste things, loves to cook and "jazz" up regular foods to make them better, but doesn't bake a lot of sweets. Pt states instead of sweets she likes potato chips  Sleep: no matter how much sleeps still feels tired. Pt is depressed and states she doesn't want to sleep her life away. Pt reports her medication makes tired so much that she won't drive after taking. Pt states her health care providers do not believe her when she said her medication is what is making her gain weight and so tired.  Patient's diabetes appears to be  controlled. However, because she reports drinking regular soda her c/o of fatigue could be effects of hyperglycemia.  Lack of physical activity and allergies could also be contributing to low energy.  Preferred Learning Style:   No preference indicated   Learning Readiness:  Contemplating   MEDICATIONS: reviewed (RD sent [name] message regarding [place] which is in list but pt states pharmacist would not refill)   DIETARY INTAKE:  Usual eating pattern includes 2 meals and 2 snacks per day.  Everyday foods include potato chips.  Avoided foods include nuts (due to MD said they could be causing swelling in legs) and cheese because she has a hard time swallowing afterward  24-hr recall:  B ( AM):  Snk ( AM):  L (11:30 am-12:00 PM): just getting out of bed. May eat left overs or cook breakfast/brunch Snk ( PM): none OR potato chips OR ice cream OR cookies, not often OR popcorn D ( PM): chicken wings, broccoli, sauce, Lipton green tea Snk ( PM): fruit "loves grapes"  Beverages: tries to drink one bottle (16 oz) water, some flavored water, reg soda  Usual physical activity: limited ADLs  Estimated energy needs:not assessed  Progress Towards Goal(s):  In progress.   Nutritional Diagnosis:  NI-5.8.2 Excessive carbohydrate intake As related to regular soda intake.  As evidenced by dietary recall.    Intervention:  Nutrition Education regarding sweetened beverages, calorie and sugar content and their effect on blood sugar and energy.  Plan: Rethink your drink Consider cutting back on Mtn Dew and other sodas Consider the snack sheet to replace your potato chips  Teaching Method Utilized:  Visual Auditory  Handouts given during visit include:  Rethink your drink   Barriers to learning/adherence to lifestyle change: home environment (Pt's husband wants to keep a lot of soda in the house)  Demonstrated degree of understanding via:  Teach Back   Monitoring/Evaluation:   Dietary intake, exercise, energy, and body weight in 4 week(s).

## 2019-08-05 ENCOUNTER — Encounter: Payer: Medicare Other | Attending: Physical Medicine & Rehabilitation | Admitting: Physical Medicine & Rehabilitation

## 2019-08-05 DIAGNOSIS — I611 Nontraumatic intracerebral hemorrhage in hemisphere, cortical: Secondary | ICD-10-CM | POA: Insufficient documentation

## 2019-08-05 DIAGNOSIS — R350 Frequency of micturition: Secondary | ICD-10-CM | POA: Insufficient documentation

## 2019-08-05 DIAGNOSIS — M19019 Primary osteoarthritis, unspecified shoulder: Secondary | ICD-10-CM | POA: Insufficient documentation

## 2019-08-05 DIAGNOSIS — E236 Other disorders of pituitary gland: Secondary | ICD-10-CM | POA: Insufficient documentation

## 2019-08-05 DIAGNOSIS — R269 Unspecified abnormalities of gait and mobility: Secondary | ICD-10-CM | POA: Insufficient documentation

## 2019-08-05 DIAGNOSIS — I69398 Other sequelae of cerebral infarction: Secondary | ICD-10-CM | POA: Insufficient documentation

## 2019-08-05 DIAGNOSIS — I10 Essential (primary) hypertension: Secondary | ICD-10-CM | POA: Insufficient documentation

## 2019-08-11 MED FILL — glipiZIDE 5 MG TABS: 5 | 90 days supply | Qty: 90 | Fill #1

## 2019-08-11 MED FILL — LISINOPRIL 10 MG TABS: 10 | 30 days supply | Qty: 30 | Fill #3

## 2019-08-11 MED FILL — ATORVASTATIN CALCIUM 20 MG: 20 | 30 days supply | Qty: 30 | Fill #3

## 2019-08-24 MED FILL — METOPROLOL TARTRATE 100 MG: 100 | 30 days supply | Qty: 60 | Fill #3

## 2019-09-13 ENCOUNTER — Other Ambulatory Visit: Payer: Self-pay

## 2019-09-13 ENCOUNTER — Encounter: Payer: Medicare Other | Attending: Physical Medicine & Rehabilitation | Admitting: Registered"

## 2019-09-13 ENCOUNTER — Encounter: Payer: Self-pay | Admitting: Registered"

## 2019-09-13 DIAGNOSIS — Z713 Dietary counseling and surveillance: Secondary | ICD-10-CM | POA: Diagnosis not present

## 2019-09-13 NOTE — Progress Notes (Signed)
Medical Nutrition Therapy:  Appt start time: 1140 end time:  1210.  Assessment:  Primary concerns today: Weight managment.   Pt states she checked her blood sugar the other day 15 min after her lunch, but doesn't remember the reading.  Patient reports her anxiety level is down since her last appointment. Pt reports she looked through list of providers, but decided to just stick with prayer and getting counsel from her pastor.   Patient states she stopped eating nuts after a MD at the clinic but it has not affected the swelling in her feet and legs.   Sleep: pt states she is not using now because the air flow in her CPAP has increased and doesn't know why. Pt reports she has a call into the office that adjusts CPAP settings. Pt states concern due to knowledge of people who have died while using CPAP.  Pt states she is not drinking as much soda, none today so far. Pt states the last time she had soda was last night after dinner due to dry mouth. Pt states she would like to work toward eliminating soda. Pt reports that her husband has to have 2 cases of soda in the house all the time, [place] and [place]. It is kept on a cart with wheels that is near the door in the kitchen and she has to move it out of the way often.   Preferred Learning Style:   No preference indicated   Learning Readiness:  Contemplating   MEDICATIONS: reviewed    DIETARY INTAKE:  Usual eating pattern includes 2 meals and 2 snacks per day.  Everyday foods include potato chips.  Avoided foods include cheese because she has a hard time swallowing afterward  24-hr recall:  B ( AM):  Snk ( AM):  L (11:30 am-12:00 PM): banana Snk ( PM): none OR potato chips OR nuts  D ( PM): 1 chicken wing, cabbage, black eyed peas Snk ( PM): potato chips (not many because bag was about empty) Soda for dry mouth  Beverages:  water, some flavored water, reg soda.  Usual physical activity: limited ADLs  Estimated energy needs:not  assessed  Progress Towards Goal(s):  In progress.   Nutritional Diagnosis:  NI-5.8.2 Excessive carbohydrate intake As related to regular soda intake.  As evidenced by dietary recall.    Intervention:  Nutrition Education reviewed importance to reduce sweetened beverages   Plan: Look for less-salt variety of nuts. Include protein with your breakfast. Continue working to drink of more water. Continue working on stopping the soda.  Consider covering up the sodas so you do see them all the time. When you have dry mouth try other things besides soda. Lemon cough drop and some water may work fine, you can try other things too. Consider having your CPAP looked at so you can start using it again  Teaching Method Utilized:  Visual Auditory  Handouts given during visit include:  none   Barriers to learning/adherence to lifestyle change: home environment (Pt's husband wants to keep a lot of soda in the house)  Demonstrated degree of understanding via:  Teach Back   Monitoring/Evaluation:  Dietary intake, exercise, energy, and body weight in 1-2 months.

## 2019-09-13 NOTE — Patient Instructions (Addendum)
Look for less-salt variety of nuts. Include protein with your breakfast. Continue working to drink of water. Continue working on stopping the soda.  Consider covering up the sodas so you do see them all the time. When you have dry mouth try other things besides soda. Lemon cough drop and some water may work fine, you can try other things too. Consider having your CPAP looked at so you can start using it again

## 2019-09-21 MED FILL — LISINOPRIL 10 MG TABS: 10 | 30 days supply | Qty: 30 | Fill #4

## 2019-09-21 MED FILL — ATORVASTATIN CALCIUM 20 MG: 20 | 30 days supply | Qty: 30 | Fill #4

## 2019-09-30 DIAGNOSIS — G4733 Obstructive sleep apnea (adult) (pediatric): Secondary | ICD-10-CM | POA: Diagnosis not present

## 2019-10-10 LAB — HM DIABETES EYE EXAM

## 2019-10-12 ENCOUNTER — Other Ambulatory Visit: Payer: Self-pay | Admitting: Family Medicine

## 2019-10-12 DIAGNOSIS — R42 Dizziness and giddiness: Secondary | ICD-10-CM

## 2019-10-12 MED FILL — METOPROLOL TARTRATE 100 MG: 100 | 30 days supply | Qty: 60 | Fill #4

## 2019-10-12 NOTE — Telephone Encounter (Signed)
Requested medication (s) are due for refill today: yes  Requested medication (s) are on the active medication list: yes  Last refill:  06/29/19 #60  Future visit scheduled: no  Notes to clinic:  Please review for refill. Refill not delegated per protocol    Requested Prescriptions  Pending Prescriptions Disp Refills   meclizine (ANTIVERT) 25 MG tablet [Pharmacy Med Name: MECLIZINE 25 MG TABLET 25 Tablet] 60 tablet 0    Sig: Take 1 tablet (25 mg total) by mouth 2 (two) times daily as needed for dizziness. Must have office visit for refills      Not Delegated - Gastroenterology: Antiemetics Failed - 10/12/2019  4:30 PM      Failed - This refill cannot be delegated      Failed - Valid encounter within last 6 months    Recent Outpatient Visits           7 months ago Type 2 diabetes mellitus with other circulatory complication, without long-term current use of insulin ([place])   [place], Rembert, MD   11 months ago Type 2 diabetes mellitus without complication, without long-term current use of insulin ([place])   [place], Charlane Ferretti, MD   1 year ago Abnormality of gait following cerebrovascular accident (CVA)   [place], Charlane Ferretti, MD   1 year ago Type 2 diabetes mellitus without complication, without long-term current use of insulin ([place])   [place], Charlane Ferretti, MD   1 year ago Type 2 diabetes mellitus with hyperglycemia, without long-term current use of insulin [facility])   [place], [place], [place]

## 2019-10-13 MED FILL — MECLIZINE 25 MG TABLET: 25 | 30 days supply | Qty: 60 | Fill #0

## 2019-11-02 MED FILL — LISINOPRIL 10 MG TABS: 10 | 30 days supply | Qty: 30 | Fill #5

## 2019-11-02 MED FILL — ATORVASTATIN CALCIUM 20 MG: 20 | 30 days supply | Qty: 30 | Fill #5

## 2019-11-08 ENCOUNTER — Encounter: Payer: Self-pay | Admitting: Registered"

## 2019-11-08 ENCOUNTER — Encounter: Payer: Medicare Other | Attending: Physical Medicine & Rehabilitation | Admitting: Registered"

## 2019-11-08 ENCOUNTER — Other Ambulatory Visit: Payer: Self-pay

## 2019-11-08 DIAGNOSIS — Z713 Dietary counseling and surveillance: Secondary | ICD-10-CM | POA: Insufficient documentation

## 2019-11-08 NOTE — Progress Notes (Signed)
Medical Nutrition Therapy:  Appt start time: 1400 end time:  1435.  Assessment:  Primary concerns today: Weight managment.  Wt Readings from Last 3 Encounters:  11/08/19 289 lb 8 oz (131.3 kg)  06/24/19 294 lb (133.4 kg)  03/30/19 298 lb (135.2 kg)   Pt states 3 days ago she started having hot/cold symptoms then feeling queasy, shakey, dizzy, diarrhea. Pt reports vomiting started last night. Pt reports she has not been eating much for the last few days. Pt states she didn't want to call doctor because they only want to talk to her over the phone and charge the same as when she goes into the office.  Pt reports last night her husband wanted to go out to [employer] and got a plate of food for her including steak, ham, shrimp, broccoli & cheese, and rice. Pt states only rice and sweet tea felt okay on her stomach.  Pt reports she has not been not been checking blood sugar since last visit.   Pt states she hasn't followed-up to get issues with CPAP resolved. Pt states new supplies were ordered but she is waiting to hear back about fixing the issues she was having. Pt states in order to talk to the company who supplies the CPAP she has to drive out to their location and does not want to drive that far. Pt states one time she was driving and got turned around and was in [facility], used GPS to get back home but it scared her to be feeling shaky and not know where she was.  RD encourage patient to reschedule appointments when not feeling well.  Preferred Learning Style:   No preference indicated   Learning Readiness:  Contemplating  MEDICATIONS: reviewed    DIETARY INTAKE:  Usual eating pattern includes 2 meals and 2 snacks per day.  Everyday foods include potato chips.  Avoided foods include cheese because she has a hard time swallowing afterward  24-hr recall: (not much due to N/V/D) B ( AM):  Snk ( AM):  L (11:30 am-12:00 PM): Snk ( PM):  D ( PM): [place]: shrimp, rice,  2 cups sweet tea Snk ( PM): ates some of left over dinner Beverages:  water, flavored water, green tea, sweet tea reg soda.  Usual physical activity: limited ADLs  Estimated energy needs:not assessed  Progress Towards Goal(s):  In progress.   Nutritional Diagnosis:  NI-5.8.2 Excessive carbohydrate intake As related to regular soda intake.  As evidenced by dietary recall.    Intervention:  Nutrition Education topics:  Staying hydrated when sick  Avoiding too much sweetened beverages  CPAP benefits  Low Blood sugar symptoms and treatment  Plan: Call nurses line or other health care provider when you are having nausea and vomiting, Write down your symptoms and when they started so you can share with the nurse.  Remember to stay hydrated, and you can sip on chicken broth, avoid soda and sweet tea. Read labels for serving size and total carbohydrates to decide how its going to affect your blood sugar. When feeling shaky or dizzy at times when you are feeling otherwise well, check your blood sugar. Follow instructions on the Low Blood Sugar handout if you blood sugar is 70 or below  Teaching Method Utilized:  Visual Auditory  Handouts given during visit include:  Low Blood Sugar  Nutrition Facts label   Barriers to learning/adherence to lifestyle change: none  Demonstrated degree of understanding via:  Teach Back  Monitoring/Evaluation:  Dietary intake, exercise, energy, and body weight in 6 weels.

## 2019-11-08 NOTE — Patient Instructions (Addendum)
Call nurses line or other health care provider when you are having nausea and vomiting, Write down your symptoms and when they started so you can share with the nurse.  Remember to stay hydrated, and you can sip in chicken broth, avoid soda and sweet tea. Read labels for serving size and total carbohydrates to decide how its going to affect your blood sugar. When feeling shaky or dizzy at times when you are feeling otherwise well, check your blood sugar. Follow instructions on the Low Blood Sugar handout if you blood sugar is 70 or below

## 2019-11-09 ENCOUNTER — Other Ambulatory Visit: Payer: Self-pay

## 2019-11-09 ENCOUNTER — Encounter (HOSPITAL_COMMUNITY): Payer: Self-pay | Admitting: Emergency Medicine

## 2019-11-09 ENCOUNTER — Emergency Department (HOSPITAL_COMMUNITY): Payer: Medicare Other

## 2019-11-09 DIAGNOSIS — A4189 Other specified sepsis: Principal | ICD-10-CM | POA: Diagnosis present

## 2019-11-09 DIAGNOSIS — E1122 Type 2 diabetes mellitus with diabetic chronic kidney disease: Secondary | ICD-10-CM | POA: Diagnosis not present

## 2019-11-09 DIAGNOSIS — Z8249 Family history of ischemic heart disease and other diseases of the circulatory system: Secondary | ICD-10-CM

## 2019-11-09 DIAGNOSIS — I129 Hypertensive chronic kidney disease with stage 1 through stage 4 chronic kidney disease, or unspecified chronic kidney disease: Secondary | ICD-10-CM | POA: Diagnosis not present

## 2019-11-09 DIAGNOSIS — Z79899 Other long term (current) drug therapy: Secondary | ICD-10-CM

## 2019-11-09 DIAGNOSIS — R197 Diarrhea, unspecified: Secondary | ICD-10-CM | POA: Diagnosis not present

## 2019-11-09 DIAGNOSIS — Z833 Family history of diabetes mellitus: Secondary | ICD-10-CM

## 2019-11-09 DIAGNOSIS — Z7984 Long term (current) use of oral hypoglycemic drugs: Secondary | ICD-10-CM

## 2019-11-09 DIAGNOSIS — G4733 Obstructive sleep apnea (adult) (pediatric): Secondary | ICD-10-CM | POA: Diagnosis not present

## 2019-11-09 DIAGNOSIS — J9601 Acute respiratory failure with hypoxia: Secondary | ICD-10-CM | POA: Diagnosis present

## 2019-11-09 DIAGNOSIS — U071 COVID-19: Secondary | ICD-10-CM | POA: Diagnosis not present

## 2019-11-09 DIAGNOSIS — K648 Other hemorrhoids: Secondary | ICD-10-CM | POA: Diagnosis not present

## 2019-11-09 DIAGNOSIS — E1165 Type 2 diabetes mellitus with hyperglycemia: Secondary | ICD-10-CM | POA: Diagnosis not present

## 2019-11-09 DIAGNOSIS — R0902 Hypoxemia: Secondary | ICD-10-CM | POA: Diagnosis not present

## 2019-11-09 DIAGNOSIS — D509 Iron deficiency anemia, unspecified: Secondary | ICD-10-CM | POA: Diagnosis present

## 2019-11-09 DIAGNOSIS — N1831 Chronic kidney disease, stage 3a: Secondary | ICD-10-CM | POA: Diagnosis not present

## 2019-11-09 DIAGNOSIS — I517 Cardiomegaly: Secondary | ICD-10-CM | POA: Diagnosis not present

## 2019-11-09 DIAGNOSIS — Z6841 Body Mass Index (BMI) 40.0 and over, adult: Secondary | ICD-10-CM

## 2019-11-09 DIAGNOSIS — A0839 Other viral enteritis: Secondary | ICD-10-CM | POA: Diagnosis not present

## 2019-11-09 DIAGNOSIS — Z8673 Personal history of transient ischemic attack (TIA), and cerebral infarction without residual deficits: Secondary | ICD-10-CM

## 2019-11-09 DIAGNOSIS — J811 Chronic pulmonary edema: Secondary | ICD-10-CM | POA: Diagnosis not present

## 2019-11-09 DIAGNOSIS — G40909 Epilepsy, unspecified, not intractable, without status epilepticus: Secondary | ICD-10-CM | POA: Diagnosis present

## 2019-11-09 DIAGNOSIS — R0602 Shortness of breath: Secondary | ICD-10-CM | POA: Diagnosis not present

## 2019-11-09 LAB — COMPREHENSIVE METABOLIC PANEL
ALT: 24 U/L (ref 0–44)
AST: 43 U/L — ABNORMAL HIGH (ref 15–41)
Albumin: 3.4 g/dL — ABNORMAL LOW (ref 3.5–5.0)
Alkaline Phosphatase: 44 U/L (ref 38–126)
Anion gap: 10 (ref 5–15)
BUN: 16 mg/dL (ref 6–20)
CO2: 26 mmol/L (ref 22–32)
Calcium: 7.8 mg/dL — ABNORMAL LOW (ref 8.9–10.3)
Chloride: 99 mmol/L (ref 98–111)
Creatinine, Ser: 1.2 mg/dL — ABNORMAL HIGH (ref 0.44–1.00)
GFR calc Af Amer: 58 mL/min — ABNORMAL LOW (ref >60)
GFR calc non Af Amer: 50 mL/min — ABNORMAL LOW (ref >60)
Glucose, Bld: 128 mg/dL — ABNORMAL HIGH (ref 70–99)
Potassium: 3.5 mmol/L (ref 3.5–5.1)
Sodium: 135 mmol/L (ref 135–145)
Total Bilirubin: 0.5 mg/dL (ref 0.3–1.2)
Total Protein: 8.3 g/dL — ABNORMAL HIGH (ref 6.5–8.1)

## 2019-11-09 LAB — LIPASE, BLOOD: Lipase: 27 U/L (ref 11–51)

## 2019-11-09 LAB — CBC
HCT: 37.8 % (ref 36.0–46.0)
Hemoglobin: 11 g/dL — ABNORMAL LOW (ref 12.0–15.0)
MCH: 22.6 pg — ABNORMAL LOW (ref 26.0–34.0)
MCHC: 29.1 g/dL — ABNORMAL LOW (ref 30.0–36.0)
MCV: 77.8 fL — ABNORMAL LOW (ref 80.0–100.0)
Platelets: 214 10*3/uL (ref 150–400)
RBC: 4.86 MIL/uL (ref 3.87–5.11)
RDW: 16.4 % — ABNORMAL HIGH (ref 11.5–15.5)
WBC: 5.6 10*3/uL (ref 4.0–10.5)
nRBC: 0 % (ref 0.0–0.2)

## 2019-11-09 LAB — CBG MONITORING, ED: Glucose-Capillary: 120 mg/dL — ABNORMAL HIGH (ref 70–99)

## 2019-11-09 LAB — I-STAT BETA HCG BLOOD, ED (MC, WL, AP ONLY): I-stat hCG, quantitative: <5 m[IU]/mL (ref <5)

## 2019-11-09 MED ORDER — ACETAMINOPHEN 325 MG PO TABS
650.0000 mg | ORAL_TABLET | Freq: Once | ORAL | Status: AC | PRN
  Administered 2019-11-09: 650 mg via ORAL
  Filled 2019-11-09: qty 2

## 2019-11-09 NOTE — ED Triage Notes (Signed)
Pt reports v/d and chills x 4 days. Reports bottom is raw. Denies being around anyone sick. Pt coughing and getting up phlegm x 2 days.

## 2019-11-10 ENCOUNTER — Inpatient Hospital Stay (HOSPITAL_COMMUNITY)
Admission: EM | Admit: 2019-11-10 | Discharge: 2019-11-16 | DRG: 871 | Disposition: A | Discharge status: Home / Self Care | Payer: Medicare Other | Attending: Internal Medicine | Admitting: Internal Medicine

## 2019-11-10 DIAGNOSIS — A4189 Other specified sepsis: Secondary | ICD-10-CM | POA: Diagnosis not present

## 2019-11-10 DIAGNOSIS — Z87898 Personal history of other specified conditions: Secondary | ICD-10-CM | POA: Diagnosis not present

## 2019-11-10 DIAGNOSIS — R0902 Hypoxemia: Secondary | ICD-10-CM

## 2019-11-10 DIAGNOSIS — J9601 Acute respiratory failure with hypoxia: Secondary | ICD-10-CM | POA: Diagnosis not present

## 2019-11-10 DIAGNOSIS — E1159 Type 2 diabetes mellitus with other circulatory complications: Secondary | ICD-10-CM

## 2019-11-10 DIAGNOSIS — U071 COVID-19: Secondary | ICD-10-CM | POA: Diagnosis present

## 2019-11-10 DIAGNOSIS — A084 Viral intestinal infection, unspecified: Secondary | ICD-10-CM

## 2019-11-10 DIAGNOSIS — K648 Other hemorrhoids: Secondary | ICD-10-CM | POA: Diagnosis present

## 2019-11-10 DIAGNOSIS — R319 Hematuria, unspecified: Secondary | ICD-10-CM | POA: Diagnosis not present

## 2019-11-10 DIAGNOSIS — R748 Abnormal levels of other serum enzymes: Secondary | ICD-10-CM

## 2019-11-10 DIAGNOSIS — R531 Weakness: Secondary | ICD-10-CM | POA: Diagnosis not present

## 2019-11-10 DIAGNOSIS — I129 Hypertensive chronic kidney disease with stage 1 through stage 4 chronic kidney disease, or unspecified chronic kidney disease: Secondary | ICD-10-CM | POA: Diagnosis present

## 2019-11-10 DIAGNOSIS — G40909 Epilepsy, unspecified, not intractable, without status epilepticus: Secondary | ICD-10-CM | POA: Diagnosis present

## 2019-11-10 DIAGNOSIS — Z6841 Body Mass Index (BMI) 40.0 and over, adult: Secondary | ICD-10-CM

## 2019-11-10 DIAGNOSIS — A0839 Other viral enteritis: Secondary | ICD-10-CM | POA: Diagnosis present

## 2019-11-10 DIAGNOSIS — E1165 Type 2 diabetes mellitus with hyperglycemia: Secondary | ICD-10-CM | POA: Diagnosis not present

## 2019-11-10 DIAGNOSIS — R197 Diarrhea, unspecified: Secondary | ICD-10-CM

## 2019-11-10 DIAGNOSIS — R7989 Other specified abnormal findings of blood chemistry: Secondary | ICD-10-CM | POA: Diagnosis not present

## 2019-11-10 DIAGNOSIS — R1110 Vomiting, unspecified: Secondary | ICD-10-CM | POA: Diagnosis not present

## 2019-11-10 DIAGNOSIS — Z8673 Personal history of transient ischemic attack (TIA), and cerebral infarction without residual deficits: Secondary | ICD-10-CM | POA: Diagnosis not present

## 2019-11-10 DIAGNOSIS — N1831 Chronic kidney disease, stage 3a: Secondary | ICD-10-CM | POA: Diagnosis not present

## 2019-11-10 DIAGNOSIS — I10 Essential (primary) hypertension: Secondary | ICD-10-CM | POA: Diagnosis not present

## 2019-11-10 DIAGNOSIS — Z8249 Family history of ischemic heart disease and other diseases of the circulatory system: Secondary | ICD-10-CM | POA: Diagnosis not present

## 2019-11-10 DIAGNOSIS — Z7984 Long term (current) use of oral hypoglycemic drugs: Secondary | ICD-10-CM | POA: Diagnosis not present

## 2019-11-10 DIAGNOSIS — E1122 Type 2 diabetes mellitus with diabetic chronic kidney disease: Secondary | ICD-10-CM | POA: Diagnosis present

## 2019-11-10 DIAGNOSIS — D509 Iron deficiency anemia, unspecified: Secondary | ICD-10-CM | POA: Diagnosis present

## 2019-11-10 DIAGNOSIS — G4733 Obstructive sleep apnea (adult) (pediatric): Secondary | ICD-10-CM | POA: Diagnosis present

## 2019-11-10 DIAGNOSIS — Z833 Family history of diabetes mellitus: Secondary | ICD-10-CM | POA: Diagnosis not present

## 2019-11-10 DIAGNOSIS — Z79899 Other long term (current) drug therapy: Secondary | ICD-10-CM | POA: Diagnosis not present

## 2019-11-10 LAB — CBG MONITORING, ED
Glucose-Capillary: 142 mg/dL — ABNORMAL HIGH (ref 70–99)
Glucose-Capillary: 182 mg/dL — ABNORMAL HIGH (ref 70–99)
Glucose-Capillary: 141 mg/dL — ABNORMAL HIGH (ref 70–99)

## 2019-11-10 LAB — BRAIN NATRIURETIC PEPTIDE: B Natriuretic Peptide: 29.2 pg/mL (ref 0.0–100.0)

## 2019-11-10 LAB — CBC WITH DIFFERENTIAL/PLATELET
Abs Immature Granulocytes: 0.06 10*3/uL (ref 0.00–0.07)
Basophils Absolute: 0 10*3/uL (ref 0.0–0.1)
Basophils Relative: 0 %
Eosinophils Absolute: 0 10*3/uL (ref 0.0–0.5)
Eosinophils Relative: 0 %
HCT: 36.9 % (ref 36.0–46.0)
Hemoglobin: 10.7 g/dL — ABNORMAL LOW (ref 12.0–15.0)
Immature Granulocytes: 1 %
Lymphocytes Relative: 16 %
Lymphs Abs: 1.1 10*3/uL (ref 0.7–4.0)
MCH: 22.6 pg — ABNORMAL LOW (ref 26.0–34.0)
MCHC: 29 g/dL — ABNORMAL LOW (ref 30.0–36.0)
MCV: 77.8 fL — ABNORMAL LOW (ref 80.0–100.0)
Monocytes Absolute: 0.3 10*3/uL (ref 0.1–1.0)
Monocytes Relative: 4 %
Neutro Abs: 5.3 10*3/uL (ref 1.7–7.7)
Neutrophils Relative %: 79 %
Platelets: 203 10*3/uL (ref 150–400)
RBC: 4.74 MIL/uL (ref 3.87–5.11)
RDW: 16.3 % — ABNORMAL HIGH (ref 11.5–15.5)
WBC: 6.8 10*3/uL (ref 4.0–10.5)
nRBC: 0 % (ref 0.0–0.2)

## 2019-11-10 LAB — URINALYSIS, ROUTINE W REFLEX MICROSCOPIC
Bilirubin Urine: NEGATIVE
Glucose, UA: NEGATIVE mg/dL
Ketones, ur: NEGATIVE mg/dL
Leukocytes,Ua: NEGATIVE
Nitrite: NEGATIVE
Protein, ur: >=300 mg/dL — AB
Specific Gravity, Urine: 1.024 (ref 1.005–1.030)
pH: 5 (ref 5.0–8.0)

## 2019-11-10 LAB — SARS CORONAVIRUS 2 BY RT PCR (HOSPITAL ORDER, PERFORMED IN ~~LOC~~ HOSPITAL LAB): SARS Coronavirus 2: POSITIVE — AB

## 2019-11-10 LAB — LACTIC ACID, PLASMA: Lactic Acid, Venous: 0.9 mmol/L (ref 0.5–1.9)

## 2019-11-10 LAB — PROCALCITONIN: Procalcitonin: <0.1 ng/mL

## 2019-11-10 LAB — ABO/RH: ABO/RH(D): O POS

## 2019-11-10 LAB — HEMOGLOBIN A1C
Hgb A1c MFr Bld: 6.4 % — ABNORMAL HIGH (ref 4.8–5.6)
Mean Plasma Glucose: 136.98 mg/dL

## 2019-11-10 LAB — FERRITIN: Ferritin: 315 ng/mL — ABNORMAL HIGH (ref 11–307)

## 2019-11-10 LAB — I-STAT BETA HCG BLOOD, ED (MC, WL, AP ONLY): I-stat hCG, quantitative: <5 m[IU]/mL (ref <5)

## 2019-11-10 LAB — FIBRINOGEN: Fibrinogen: 545 mg/dL — ABNORMAL HIGH (ref 210–475)

## 2019-11-10 LAB — CREATININE, SERUM
Creatinine, Ser: 1.2 mg/dL — ABNORMAL HIGH (ref 0.44–1.00)
GFR calc Af Amer: 58 mL/min — ABNORMAL LOW (ref >60)
GFR calc non Af Amer: 50 mL/min — ABNORMAL LOW (ref >60)

## 2019-11-10 LAB — LACTATE DEHYDROGENASE: LDH: 424 U/L — ABNORMAL HIGH (ref 98–192)

## 2019-11-10 LAB — TRIGLYCERIDES: Triglycerides: 63 mg/dL (ref <150)

## 2019-11-10 LAB — D-DIMER, QUANTITATIVE: D-Dimer, Quant: 0.98 ug/mL-FEU — ABNORMAL HIGH (ref 0.00–0.50)

## 2019-11-10 LAB — C-REACTIVE PROTEIN: CRP: 12.6 mg/dL — ABNORMAL HIGH (ref <1.0)

## 2019-11-10 LAB — HIV ANTIBODY (ROUTINE TESTING W REFLEX): HIV Screen 4th Generation wRfx: NONREACTIVE

## 2019-11-10 MED ORDER — ATORVASTATIN CALCIUM 20 MG PO TABS
20.0000 mg | ORAL_TABLET | Freq: Every day | ORAL | Status: DC
  Administered 2019-11-11 – 2019-11-16 (×6): 20 mg via ORAL
  Filled 2019-11-10: qty 1
  Filled 2019-11-10: qty 2
  Filled 2019-11-10: qty 1
  Filled 2019-11-10: qty 2
  Filled 2019-11-10 (×2): qty 1

## 2019-11-10 MED ORDER — POLYVINYL ALCOHOL 1.4 % OP SOLN: 2.0000 [drp] | Freq: Four times a day (QID) | OPHTHALMIC | Status: DC | PRN

## 2019-11-10 MED ORDER — ONDANSETRON HCL 4 MG PO TABS: 4.0000 mg | ORAL_TABLET | Freq: Four times a day (QID) | ORAL | Status: DC | PRN

## 2019-11-10 MED ORDER — ALBUTEROL SULFATE HFA 108 (90 BASE) MCG/ACT IN AERS
2.0000 | INHALATION_SPRAY | RESPIRATORY_TRACT | Status: DC | PRN
  Filled 2019-11-10: qty 6.7

## 2019-11-10 MED ORDER — PANTOPRAZOLE SODIUM 40 MG IV SOLR
40.0000 mg | Freq: Every day | INTRAVENOUS | Status: DC
  Administered 2019-11-10 – 2019-11-12 (×3): 40 mg via INTRAVENOUS
  Filled 2019-11-10 (×4): qty 40

## 2019-11-10 MED ORDER — SODIUM CHLORIDE 0.9 % IV SOLN
200.0000 mg | Freq: Once | INTRAVENOUS | Status: AC
  Administered 2019-11-10: 200 mg via INTRAVENOUS
  Filled 2019-11-10: qty 200

## 2019-11-10 MED ORDER — IPRATROPIUM-ALBUTEROL 20-100 MCG/ACT IN AERS
1.0000 | INHALATION_SPRAY | Freq: Four times a day (QID) | RESPIRATORY_TRACT | Status: DC
  Administered 2019-11-10 – 2019-11-16 (×23): 1 via RESPIRATORY_TRACT
  Filled 2019-11-10 (×2): qty 4

## 2019-11-10 MED ORDER — GUAIFENESIN-DM 100-10 MG/5ML PO SYRP
10.0000 mL | ORAL_SOLUTION | ORAL | Status: DC | PRN
  Administered 2019-11-14: 10 mL via ORAL
  Filled 2019-11-10: qty 10

## 2019-11-10 MED ORDER — ZINC SULFATE 220 (50 ZN) MG PO CAPS
220.0000 mg | ORAL_CAPSULE | Freq: Every day | ORAL | Status: DC
  Administered 2019-11-10 – 2019-11-16 (×7): 220 mg via ORAL
  Filled 2019-11-10 (×7): qty 1

## 2019-11-10 MED ORDER — ACETAMINOPHEN 325 MG PO TABS
650.0000 mg | ORAL_TABLET | Freq: Four times a day (QID) | ORAL | Status: DC | PRN
  Administered 2019-11-12 – 2019-11-14 (×3): 650 mg via ORAL
  Filled 2019-11-10 (×3): qty 2

## 2019-11-10 MED ORDER — METOPROLOL TARTRATE 50 MG PO TABS
100.0000 mg | ORAL_TABLET | Freq: Two times a day (BID) | ORAL | Status: DC
  Administered 2019-11-10 – 2019-11-16 (×12): 100 mg via ORAL
  Filled 2019-11-10: qty 4
  Filled 2019-11-10 (×3): qty 2
  Filled 2019-11-10: qty 4
  Filled 2019-11-10 (×2): qty 2
  Filled 2019-11-10: qty 4
  Filled 2019-11-10 (×2): qty 2
  Filled 2019-11-10: qty 4
  Filled 2019-11-10: qty 2

## 2019-11-10 MED ORDER — INSULIN ASPART 100 UNIT/ML ~~LOC~~ SOLN
0.0000 [IU] | Freq: Three times a day (TID) | SUBCUTANEOUS | Status: DC
  Administered 2019-11-10: 3 [IU] via SUBCUTANEOUS
  Administered 2019-11-10: 2 [IU] via SUBCUTANEOUS
  Administered 2019-11-11 (×3): 3 [IU] via SUBCUTANEOUS
  Administered 2019-11-12: 5 [IU] via SUBCUTANEOUS
  Administered 2019-11-12 – 2019-11-13 (×3): 3 [IU] via SUBCUTANEOUS
  Administered 2019-11-13 (×2): 5 [IU] via SUBCUTANEOUS
  Administered 2019-11-14 – 2019-11-15 (×4): 3 [IU] via SUBCUTANEOUS
  Administered 2019-11-15: 5 [IU] via SUBCUTANEOUS
  Administered 2019-11-15: 3 [IU] via SUBCUTANEOUS
  Administered 2019-11-16: 2 [IU] via SUBCUTANEOUS
  Administered 2019-11-16: 3 [IU] via SUBCUTANEOUS
  Filled 2019-11-10: qty 0.15

## 2019-11-10 MED ORDER — SODIUM CHLORIDE 0.9 % IV SOLN
100.0000 mg | Freq: Every day | INTRAVENOUS | Status: AC
  Administered 2019-11-11 – 2019-11-14 (×4): 100 mg via INTRAVENOUS
  Filled 2019-11-10 (×4): qty 20

## 2019-11-10 MED ORDER — LEVETIRACETAM 500 MG PO TABS
750.0000 mg | ORAL_TABLET | Freq: Two times a day (BID) | ORAL | Status: DC
  Administered 2019-11-10 – 2019-11-16 (×12): 750 mg via ORAL
  Filled 2019-11-10 (×12): qty 1

## 2019-11-10 MED ORDER — TRAZODONE HCL 50 MG PO TABS
25.0000 mg | ORAL_TABLET | Freq: Every evening | ORAL | Status: DC | PRN
  Administered 2019-11-12: 25 mg via ORAL
  Filled 2019-11-10 (×2): qty 1

## 2019-11-10 MED ORDER — ACETAMINOPHEN 325 MG PO TABS
650.0000 mg | ORAL_TABLET | Freq: Once | ORAL | Status: AC
  Administered 2019-11-10: 650 mg via ORAL
  Filled 2019-11-10: qty 2

## 2019-11-10 MED ORDER — LISINOPRIL 10 MG PO TABS
10.0000 mg | ORAL_TABLET | Freq: Every day | ORAL | Status: DC
  Administered 2019-11-10 – 2019-11-12 (×3): 10 mg via ORAL
  Filled 2019-11-10 (×3): qty 1

## 2019-11-10 MED ORDER — ASCORBIC ACID 500 MG PO TABS
500.0000 mg | ORAL_TABLET | Freq: Every day | ORAL | Status: DC
  Administered 2019-11-10 – 2019-11-16 (×7): 500 mg via ORAL
  Filled 2019-11-10 (×7): qty 1

## 2019-11-10 MED ORDER — INSULIN ASPART 100 UNIT/ML ~~LOC~~ SOLN
0.0000 [IU] | Freq: Every day | SUBCUTANEOUS | Status: DC
  Administered 2019-11-12 – 2019-11-13 (×2): 2 [IU] via SUBCUTANEOUS
  Administered 2019-11-15: 3 [IU] via SUBCUTANEOUS
  Filled 2019-11-10: qty 0.05

## 2019-11-10 MED ORDER — ONDANSETRON HCL 4 MG/2ML IJ SOLN
4.0000 mg | Freq: Four times a day (QID) | INTRAMUSCULAR | Status: DC | PRN
  Administered 2019-11-10: 4 mg via INTRAVENOUS
  Filled 2019-11-10: qty 2

## 2019-11-10 MED ORDER — METHYLPREDNISOLONE SODIUM SUCC 125 MG IJ SOLR
0.5000 mg/kg | Freq: Two times a day (BID) | INTRAMUSCULAR | Status: DC
  Administered 2019-11-11 – 2019-11-15 (×10): 65.625 mg via INTRAVENOUS
  Filled 2019-11-10 (×9): qty 2

## 2019-11-10 MED ORDER — DEXAMETHASONE SODIUM PHOSPHATE 10 MG/ML IJ SOLN
10.0000 mg | Freq: Once | INTRAMUSCULAR | Status: AC
  Administered 2019-11-10: 10 mg via INTRAVENOUS
  Filled 2019-11-10: qty 1

## 2019-11-10 MED ORDER — TRAMADOL HCL 50 MG PO TABS
50.0000 mg | ORAL_TABLET | Freq: Two times a day (BID) | ORAL | Status: DC | PRN
  Administered 2019-11-10 – 2019-11-14 (×2): 50 mg via ORAL
  Filled 2019-11-10 (×2): qty 1

## 2019-11-10 MED ORDER — HYDROCOD POLST-CPM POLST ER 10-8 MG/5ML PO SUER
5.0000 mL | Freq: Two times a day (BID) | ORAL | Status: DC | PRN
  Administered 2019-11-13 – 2019-11-15 (×3): 5 mL via ORAL
  Filled 2019-11-10 (×3): qty 5

## 2019-11-10 MED ORDER — INSULIN ASPART 100 UNIT/ML ~~LOC~~ SOLN
6.0000 [IU] | Freq: Three times a day (TID) | SUBCUTANEOUS | Status: DC
  Administered 2019-11-10 – 2019-11-16 (×19): 6 [IU] via SUBCUTANEOUS
  Filled 2019-11-10: qty 0.06

## 2019-11-10 MED ORDER — HYPROMELLOSE (GONIOSCOPIC) 2.5 % OP SOLN: 2.0000 [drp] | Freq: Four times a day (QID) | OPHTHALMIC | Status: DC | PRN

## 2019-11-10 MED ORDER — ENOXAPARIN SODIUM 60 MG/0.6ML ~~LOC~~ SOLN
60.0000 mg | Freq: Every day | SUBCUTANEOUS | Status: DC
  Administered 2019-11-10 – 2019-11-14 (×5): 60 mg via SUBCUTANEOUS
  Filled 2019-11-10 (×5): qty 0.6

## 2019-11-10 MED ORDER — BARICITINIB 2 MG PO TABS
2.0000 mg | ORAL_TABLET | Freq: Every day | ORAL | Status: DC
  Administered 2019-11-10 – 2019-11-14 (×5): 2 mg via ORAL
  Filled 2019-11-10 (×5): qty 1

## 2019-11-10 NOTE — ED Provider Notes (Signed)
[facility] DEPT Provider Note   CSN: [id number] Arrival date & time: 11/09/19  1301     History Chief Complaint  Patient presents with  . Diarrhea  . Emesis  . Chills    [name] is a 59 y.o. female.  The history is provided by the patient.  Diarrhea Quality:  Watery Severity:  Severe Onset quality:  Gradual Duration:  3 days Timing:  Intermittent Progression:  Worsening Relieved by:  None tried Worsened by:  Nothing Associated symptoms: chills, cough, fever, myalgias and vomiting   Associated symptoms: no abdominal pain   Emesis Associated symptoms: chills, cough, diarrhea, fever and myalgias   Associated symptoms: no abdominal pain   Patient with history of hypertension, diabetes, obesity presents with vomiting diarrhea.  Patient also reports cough.  She reports fevers and chills.  She reports has been having increasing cough and productive sputum for the past 2 days.  Reports chest pain with cough. She is not vaccinated for COVID-19     Past Medical History:  Diagnosis Date  . Headache   . Hypertension   . Iron deficiency anemia   . Obesity   . Seizures ([place])   . Stroke ([place])   . Type 2 diabetes mellitus [facility])     Patient Active Problem List   Diagnosis Date Noted  . Urinary frequency 06/24/2019  . 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 BRBPR (bright red blood per rectum)   . Polyp of cecum   . 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 Empty sella ([place]) 02/24/2018  . Morbid obesity due to excess calories ([place]) 02/05/2018  . 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 History of stroke 07/16/2017  . Headache 04/02/2017  . Seizure disorder ([place]) 10/02/2016  . Obstructive sleep apnea 11/29/2015  . Right shoulder pain 11/29/2015  . Adjustment disorder with depressed mood 10/19/2015  . Nocturnal enuresis   . Hypokalemia   . Anemia, iron deficiency   .  Super obese   . Type 2 diabetes mellitus ([place])   . Orthostatic hypotension   . Supplemental oxygen dependent   . Diastolic dysfunction   . Tachypnea   . AKI (acute kidney injury) ([place])   . Acute blood loss anemia   . Lethargy   . Wheezing   . Benign essential HTN   . ICH (intracerebral hemorrhage) ([place]) 10/06/2015    Past Surgical History:  Procedure Laterality Date  . CARDIAC CATHETERIZATION  04/2010   Dr. Terrence Dupont  . COLONOSCOPY WITH PROPOFOL N/A 04/06/2019   Procedure: COLONOSCOPY WITH PROPOFOL;  Surgeon: Mauri Pole, MD;  Location: [facility] ENDOSCOPY;  Service: Endoscopy;  Laterality: N/A;  . POLYPECTOMY  04/06/2019   Procedure: POLYPECTOMY;  Surgeon: Mauri Pole, MD;  Location: [facility] ENDOSCOPY;  Service: Endoscopy;;     OB History   No obstetric history on file.     Family History  Problem Relation Age of Onset  . Diabetes Father   . Hypertension Father   . Hypertension Sister   . Stroke Brother     Social History   Tobacco Use  . Smoking status: Never Smoker  . Smokeless tobacco: Never Used  Vaping Use  . Vaping Use: Never used  Substance Use Topics  . Alcohol use: No  . Drug use: No    Home Medications Prior to Admission medications   Medication Sig Start Date End Date Taking? Authorizing Provider  Accu-Chek FastClix Lancets MISC Check  blood sugar up to 3 times daily. E11.59 02/22/19   Charlott Rakes, MD  acetaminophen (TYLENOL) 500 MG tablet Take 500-1,000 mg by mouth every 6 (six) hours as needed for headache (pain).     [provider]  atorvastatin (LIPITOR) 20 MG tablet Take 1 tablet (20 mg total) by mouth daily. 02/15/19   Charlott Rakes, MD  Blood Glucose Monitoring Suppl (ACCU-CHEK GUIDE ME) w/Device KIT 1 kit by Does not apply route daily. Check blood sugar up to 3 times daily. E11.59 02/22/19   Charlott Rakes, MD  cetirizine (ZYRTEC) 10 MG tablet Take 10 mg by mouth daily as needed for allergies.     [provider]    diclofenac Sodium (VOLTAREN) 1 % GEL Apply 1 application topically 4 (four) times daily as needed (pain.). OTC    [provider]  diphenhydrAMINE (BENADRYL) 25 MG tablet Take 25 mg by mouth at bedtime.     [provider]  fluticasone (FLONASE) 50 MCG/ACT nasal spray Place 1 spray into both nostrils daily as needed for allergies or rhinitis. Patient taking differently: Place 1 spray into both nostrils at bedtime.  10/20/18   Charlott Rakes, MD  glipiZIDE (GLUCOTROL) 5 MG tablet Take 0.5 tablets (2.5 mg total) by mouth 2 (two) times daily before a meal. 02/15/19   Newlin, Enobong, MD  glucose blood (ACCU-CHEK GUIDE) test strip Check blood sugar up to 3 times daily. E11.59 02/22/19   Charlott Rakes, MD  hydroxypropyl methylcellulose / hypromellose (ISOPTO TEARS / GONIOVISC) 2.5 % ophthalmic solution Place 2 drops into both eyes as needed (irritation). Patient taking differently: Place 2 drops into both eyes 4 (four) times daily as needed (irritation).  01/25/19   Charlott Rakes, MD  levETIRAcetam (KEPPRA) 500 MG tablet TAKE 1 & 1/2 TABLETS BY MOUTH TWICE DAILY. Patient taking differently: Take 750 mg by mouth 2 (two) times daily.  04/17/18   Dennie Bible, NP  lidocaine (LIDODERM) 5 % Place 1 patch onto the skin daily. Remove & Discard patch within 12 hours or as directed by MD Patient taking differently: Place 1 patch onto the skin daily as needed (PAIN.). Remove & Discard patch within 12 hours or as directed by MD 03/01/18   Maczis, Barth Kirks, PA-C  lisinopril (ZESTRIL) 10 MG tablet Take 1 tablet (10 mg total) by mouth daily. 02/15/19   Charlott Rakes, MD  meclizine (ANTIVERT) 25 MG tablet TAKE 1 TABLET (25 MG TOTAL) BY MOUTH 2 (TWO) TIMES DAILY AS NEEDED FOR DIZZINESS. MUST HAVE OFFICE VISIT FOR REFILLS 10/13/19   Charlott Rakes, MD  metFORMIN (GLUCOPHAGE XR) 500 MG 24 hr tablet Take 2 tablets (1,000 mg total) by mouth 2 (two) times daily. 02/15/19   Charlott Rakes, MD   metoprolol tartrate (LOPRESSOR) 100 MG tablet Take 1 tablet (100 mg total) by mouth 2 (two) times daily. 02/15/19   Charlott Rakes, MD  Na Sulfate-K Sulfate-Mg Sulf 17.5-3.13-1.6 GM/177ML SOLN 1 suprep kit for colonoscopy 05/04/18   Nandigam, Venia Minks, MD  tiZANidine (ZANAFLEX) 2 MG tablet Take 1 tablet (2 mg total) by mouth every 8 (eight) hours as needed for muscle spasms. 07/05/19   Jamse Arn, MD    Allergies    Bee venom, Eggs or egg-derived products, Gabapentin, and Gadolinium derivatives  Review of Systems   Review of Systems  Constitutional: Positive for chills and fever.  Respiratory: Positive for cough.   Cardiovascular:       Chest pain with cough  Gastrointestinal: Positive  for diarrhea and vomiting. Negative for abdominal pain.  Musculoskeletal: Positive for myalgias.  All other systems reviewed and are negative.   Physical Exam Updated Vital Signs BP (!) 177/81 (BP Location: Right Arm)   Pulse (!) 103   Temp (!) 102.2 F (39 C) (Oral)   Resp (!) 22   LMP 02/17/2011 (Exact Date)   SpO2 92%   Physical Exam CONSTITUTIONAL: Lying on her side, ill-appearing HEAD: Normocephalic/atraumatic EYES: EOMI/PERRL ENMT: Mask in place NECK: supple no meningeal signs SPINE/BACK:entire spine nontender CV: S1/S2 noted, tachycardic LUNGS: Tachypnea, scattered wheeze bilaterally ABDOMEN: soft, nontender, no rebound or guarding, bowel sounds noted throughout abdomen, obese NEURO: Pt is awake/alert/appropriate, moves all extremitiesx4.  No facial droop.   EXTREMITIES: pulses normal/equal, full ROM, edema noted bilateral extremities SKIN: warm, color normal PSYCH: no abnormalities of mood noted, alert and oriented to situation  ED Results / Procedures / Treatments   Labs (all labs ordered are listed, but only abnormal results are displayed) Labs Reviewed  COMPREHENSIVE METABOLIC PANEL - Abnormal; Notable for the following components:      Result Value   Glucose, Bld  128 (*)    Creatinine, Ser 1.20 (*)    Calcium 7.8 (*)    Total Protein 8.3 (*)    Albumin 3.4 (*)    AST 43 (*)    GFR calc non Af Amer 50 (*)    GFR calc Af Amer 58 (*)    All other components within normal limits  CBC - Abnormal; Notable for the following components:   Hemoglobin 11.0 (*)    MCV 77.8 (*)    MCH 22.6 (*)    MCHC 29.1 (*)    RDW 16.4 (*)    All other components within normal limits  URINALYSIS, ROUTINE W REFLEX MICROSCOPIC - Abnormal; Notable for the following components:   Color, Urine AMBER (*)    APPearance HAZY (*)    Hgb urine dipstick SMALL (*)    Protein, ur >=300 (*)    Bacteria, UA FEW (*)    All other components within normal limits  CBG MONITORING, ED - Abnormal; Notable for the following components:   Glucose-Capillary 120 (*)    All other components within normal limits  SARS CORONAVIRUS 2 BY RT PCR (HOSPITAL ORDER, [place] [facility])  LIPASE, BLOOD  BRAIN NATRIURETIC PEPTIDE  I-STAT BETA HCG BLOOD, ED ([facility], [facility], [facility] ONLY)    EKG None  Radiology DG Chest 2 View  Result Date: 11/09/2019 CLINICAL DATA:  Cough, vomiting, diarrhea and chills for 4 days. EXAM: CHEST - 2 VIEW COMPARISON:  PA and lateral chest 01/21/2018. FINDINGS: There is cardiomegaly and vascular congestion. No consolidative process, pneumothorax or pleural effusion. No acute or focal bony abnormality. Thoracic spondylosis noted. IMPRESSION: Cardiomegaly and pulmonary vascular congestion. Electronically Signed   By: Inge Rise M.D.   On: 11/09/2019 14:52    Procedures Procedures    Medications Ordered in ED Medications  acetaminophen (TYLENOL) tablet 650 mg (650 mg Oral Given 11/09/19 1328)  acetaminophen (TYLENOL) tablet 650 mg (650 mg Oral Given 11/10/19 4174)    ED Course  I have reviewed the triage vital signs and the nursing notes.  Pertinent labs & imaging results that were available during my care of the patient were reviewed by me and  considered in my medical decision making (see chart for details).    MDM Rules/Calculators/A&P  6:53 AM Patient reported several days of vomiting and diarrhea. Over the past 2 days she has had increasing cough and shortness of breath.  She denies known history of lung disease. On reassessment of the room her room air pulse ox was 85%.  She is now currently on 2 L nasal cannula. Strong suspicion that she may have COVID-19. Her tests are pending at this time. Signed out to Dr. Roderic Palau at shift change Pt stable at this time   MELICIA ESQUEDA was evaluated in Emergency Department on 11/10/2019 for the symptoms described in the history of present illness. She was evaluated in the context of the global COVID-19 pandemic, which necessitated consideration that the patient might be at risk for infection with the SARS-CoV-2 virus that causes COVID-19. Institutional protocols and algorithms that pertain to the evaluation of patients at risk for COVID-19 are in a state of rapid change based on information released by regulatory bodies including the CDC and federal and state organizations. These policies and algorithms were followed during the patient's care in the ED.  Final Clinical Impression(s) / ED Diagnoses Final diagnoses:  None    Rx / DC Orders ED Discharge Orders    None       Ripley Fraise, MD 11/10/19 [phone]

## 2019-11-10 NOTE — Progress Notes (Signed)
Pharmacy: Baricitinib  Patient's a 59 y.o F presented to the ED on 8/18 with c/o vomiting, diarrhea, and SOB. COVID test came back positive.  She was started on Remdesivir this morning. Phamacy is consulted to start baricitinib.  - LFTs wnl -scr 1.20 ( eGFR 58)  Plan: - baricitinib 2 mg PO daily (for eGFR 30 to <60 mL/min/1.91m) x14 days - will f/u with renal function and adjust dose if/when appropriate  ADia Sitter PharmD, BCPS 11/10/2019 4:45 PM

## 2019-11-10 NOTE — ED Notes (Addendum)
Pt OOB to bedside commode with assist. Returned to stretcher with assist. NAD. No complaints voiced. IV steroids and ABX up a/o. Awaiting for remaining meds to be verified by pharmacy.

## 2019-11-10 NOTE — ED Notes (Signed)
Unable to get second set of blood cultures.

## 2019-11-10 NOTE — H&P (Signed)
History and Physical    [name] HQP:[id number] DOB: 06-25-1960 DOA: 11/10/2019  PCP: Charlott Rakes, MD Patient coming from: Home.  Lives with husband.  Chief Complaint: Vomiting and diarrhea  HPI: [name] is a 59 y.o. female with history of HTN, DM-2, CVA, seizure and morbid obesity presenting with the above chief complaints.  Patient reports multiple episodes of vomiting and diarrhea for about 4 days.  Emesis and diarrhea with nonbloody.  Symptoms have been progressively worse.  She felt weak and tired.  She was unable to keep anything down for the last 2 to 3 days.  She also felt hot and cold for the last 2 to 3 days.  She did not check her temperature.  Reports productive cough with whitish phlegm.  She denies hemoptysis.  Also reports shortness of breath.  Denies orthopnea or PND.  She thinks she noticed some swelling around her ankles.  She also reports right-sided abdominal pain and left chest pain with cough.  She denies UTI symptoms.  She is not vaccinated against COVID-19.  Denies history of heart disease, CHF or kidney disease.  She reports history of kidney failure and heart failure in her mother.  She lives with her husband.  Denies smoking cigarettes, drinking alcohol recreational drug use.  In ED, febrile to 102.2.  HR 103 but improved to 98.  RR ranges from 22-28.  BP 177/81> 146/79.  Desaturated to 85% on RA but recovered to 96% on 2 L.  COVID-19 PCR positive. Cr 1.20.  AST 43.  Hgb 11.  CXR with cardiomegaly and vascular congestion.  Started on Decadron and remdesivir and hospitalist service was called for admission.    ROS All review of system negative except for pertinent positives and negatives as history of present illness above.  PMH Past Medical History:  Diagnosis Date  . Headache   . Hypertension   . Iron deficiency anemia   . Obesity   . Seizures ([place])   . Stroke ([place])   . Type 2 diabetes mellitus (HCC)    PSH Past Surgical History:    Procedure Laterality Date  . CARDIAC CATHETERIZATION  04/2010   Dr. Terrence Dupont  . COLONOSCOPY WITH PROPOFOL N/A 04/06/2019   Procedure: COLONOSCOPY WITH PROPOFOL;  Surgeon: Mauri Pole, MD;  Location: [facility];  Service: Endoscopy;  Laterality: N/A;  . POLYPECTOMY  04/06/2019   Procedure: POLYPECTOMY;  Surgeon: Mauri Pole, MD;  Location: [facility];  Service: Endoscopy;;   [place] Family History  Problem Relation Age of Onset  . Diabetes Father   . Hypertension Father   . Hypertension Sister   . Stroke Brother    Social Hx  reports that she has never smoked. She has never used smokeless tobacco. She reports that she does not drink alcohol and does not use drugs.  Allergy Allergies  Allergen Reactions  . Bee Venom Swelling    Facial swelling  . Eggs Or Egg-Derived Products Nausea And Vomiting and Swelling    Mouth swelled  . Gabapentin     Jittery feeling, dyspnea, crawling sensation  . Gadolinium Derivatives Itching    Immediately after gad injection pt. complained of tongue numbness and lower lip/ then had rt side of body tingling/ pt. Was assessed by Dr. Jeralyn Ruths before leaving/ no medications were administered/   Home Meds Prior to Admission medications   Medication Sig Start Date End Date Taking? Authorizing Provider  Accu-Chek FastClix Lancets MISC Check blood sugar up to  3 times daily. E11.59 02/22/19   Charlott Rakes, MD  acetaminophen (TYLENOL) 500 MG tablet Take 500-1,000 mg by mouth every 6 (six) hours as needed for headache (pain).     [provider]  atorvastatin (LIPITOR) 20 MG tablet Take 1 tablet (20 mg total) by mouth daily. 02/15/19   Charlott Rakes, MD  Blood Glucose Monitoring Suppl (ACCU-CHEK GUIDE ME) w/Device KIT 1 kit by Does not apply route daily. Check blood sugar up to 3 times daily. E11.59 02/22/19   Charlott Rakes, MD  cetirizine (ZYRTEC) 10 MG tablet Take 10 mg by mouth daily as needed for allergies.     [provider]  diclofenac Sodium (VOLTAREN) 1 % GEL Apply 1 application topically 4 (four) times daily as needed (pain.). OTC    [provider]  diphenhydrAMINE (BENADRYL) 25 MG tablet Take 25 mg by mouth at bedtime.     [provider]  fluticasone (FLONASE) 50 MCG/ACT nasal spray Place 1 spray into both nostrils daily as needed for allergies or rhinitis. Patient taking differently: Place 1 spray into both nostrils at bedtime.  10/20/18   Charlott Rakes, MD  glipiZIDE (GLUCOTROL) 5 MG tablet Take 0.5 tablets (2.5 mg total) by mouth 2 (two) times daily before a meal. 02/15/19   Newlin, Enobong, MD  glucose blood (ACCU-CHEK GUIDE) test strip Check blood sugar up to 3 times daily. E11.59 02/22/19   Charlott Rakes, MD  hydroxypropyl methylcellulose / hypromellose (ISOPTO TEARS / GONIOVISC) 2.5 % ophthalmic solution Place 2 drops into both eyes as needed (irritation). Patient taking differently: Place 2 drops into both eyes 4 (four) times daily as needed (irritation).  01/25/19   Charlott Rakes, MD  levETIRAcetam (KEPPRA) 500 MG tablet TAKE 1 & 1/2 TABLETS BY MOUTH TWICE DAILY. Patient taking differently: Take 750 mg by mouth 2 (two) times daily.  04/17/18   Dennie Bible, NP  lidocaine (LIDODERM) 5 % Place 1 patch onto the skin daily. Remove & Discard patch within 12 hours or as directed by MD Patient taking differently: Place 1 patch onto the skin daily as needed (PAIN.). Remove & Discard patch within 12 hours or as directed by MD 03/01/18   Maczis, Barth Kirks, PA-C  lisinopril (ZESTRIL) 10 MG tablet Take 1 tablet (10 mg total) by mouth daily. 02/15/19   Charlott Rakes, MD  meclizine (ANTIVERT) 25 MG tablet TAKE 1 TABLET (25 MG TOTAL) BY MOUTH 2 (TWO) TIMES DAILY AS NEEDED FOR DIZZINESS. MUST HAVE OFFICE VISIT FOR REFILLS 10/13/19   Charlott Rakes, MD  metFORMIN (GLUCOPHAGE XR) 500 MG 24 hr tablet Take 2 tablets (1,000 mg total) by mouth 2 (two) times daily. 02/15/19   Charlott Rakes, MD  metoprolol tartrate (LOPRESSOR) 100 MG tablet Take 1 tablet (100 mg total) by mouth 2 (two) times daily. 02/15/19   Charlott Rakes, MD  Na Sulfate-K Sulfate-Mg Sulf 17.5-3.13-1.6 GM/177ML SOLN 1 suprep kit for colonoscopy 05/04/18   Nandigam, Venia Minks, MD  tiZANidine (ZANAFLEX) 2 MG tablet Take 1 tablet (2 mg total) by mouth every 8 (eight) hours as needed for muscle spasms. 07/05/19   Jamse Arn, MD    Physical Exam: Vitals:   11/10/19 0610 11/10/19 0700 11/10/19 0800 11/10/19 0900  BP: (!) 148/86 139/76 (!) 153/94 (!) 146/79  Pulse: (!) 110 99 97 98  Resp: (!) 28 (!) 26 (!) 24 (!) 26  Temp:  99.2 F (37.3 C)    TempSrc:      SpO2:  99% 96% 94% 98%    GENERAL: No acute distress.  Appears well.  HEENT: MMM.  Vision and hearing grossly intact.  NECK: Supple.  Difficult to assess JVD due to body habitus. RESP: 96% on 2 L.  No IWOB.  Diminished aeration partly due to body habitus. CVS:  RRR. Heart sounds normal.  ABD/GI/GU: Bowel sounds present. Soft. Non tender.  MSK/EXT:  Moves extremities. No apparent deformity or edema.  SKIN: no apparent skin lesion or wound NEURO: Awake, alert and oriented appropriately.  No gross deficit.  PSYCH: Calm. Normal affect.   Personally Reviewed Radiological Exams DG Chest 2 View  Result Date: 11/09/2019 CLINICAL DATA:  Cough, vomiting, diarrhea and chills for 4 days. EXAM: CHEST - 2 VIEW COMPARISON:  PA and lateral chest 01/21/2018. FINDINGS: There is cardiomegaly and vascular congestion. No consolidative process, pneumothorax or pleural effusion. No acute or focal bony abnormality. Thoracic spondylosis noted. IMPRESSION: Cardiomegaly and pulmonary vascular congestion. Electronically Signed   By: Inge Rise M.D.   On: 11/09/2019 14:52     Personally Reviewed Labs: CBC: Recent Labs  Lab 11/09/19 1337  WBC 5.6  HGB 11.0*  HCT 37.8  MCV 77.8*  PLT 038   Basic Metabolic Panel: Recent Labs  Lab 11/09/19 1337  NA  135  K 3.5  CL 99  CO2 26  GLUCOSE 128*  BUN 16  CREATININE 1.20*  CALCIUM 7.8*   GFR: Estimated Creatinine Clearance: 63.2 mL/min (A) (by C-G formula based on SCr of 1.2 mg/dL (H)). Liver Function Tests: Recent Labs  Lab 11/09/19 1337  AST 43*  ALT 24  ALKPHOS 44  BILITOT 0.5  PROT 8.3*  ALBUMIN 3.4*   Recent Labs  Lab 11/09/19 1337  LIPASE 27   No results for input(s): AMMONIA in the last 168 hours. Coagulation Profile: No results for input(s): INR, PROTIME in the last 168 hours. Cardiac Enzymes: No results for input(s): CKTOTAL, CKMB, CKMBINDEX, TROPONINI in the last 168 hours. BNP (last 3 results) No results for input(s): PROBNP in the last 8760 hours. HbA1C: No results for input(s): HGBA1C in the last 72 hours. CBG: Recent Labs  Lab 11/09/19 1330  GLUCAP 120*   Lipid Profile: No results for input(s): CHOL, HDL, LDLCALC, TRIG, CHOLHDL, LDLDIRECT in the last 72 hours. Thyroid Function Tests: No results for input(s): TSH, T4TOTAL, FREET4, T3FREE, THYROIDAB in the last 72 hours. Anemia Panel: No results for input(s): VITAMINB12, FOLATE, FERRITIN, TIBC, IRON, RETICCTPCT in the last 72 hours. Urine analysis:    Component Value Date/Time   COLORURINE AMBER (A) 11/10/2019 0417   APPEARANCEUR HAZY (A) 11/10/2019 0417   LABSPEC 1.024 11/10/2019 0417   PHURINE 5.0 11/10/2019 0417   GLUCOSEU NEGATIVE 11/10/2019 0417   GLUCOSEU NEGATIVE 02/24/2018 1457   HGBUR SMALL (A) 11/10/2019 0417   BILIRUBINUR NEGATIVE 11/10/2019 0417   KETONESUR NEGATIVE 11/10/2019 0417   PROTEINUR >=300 (A) 11/10/2019 0417   UROBILINOGEN 0.2 02/24/2018 1457   NITRITE NEGATIVE 11/10/2019 0417   LEUKOCYTESUR NEGATIVE 11/10/2019 0417    Sepsis Labs:  None  Personally Reviewed EKG:  No EKG obtained.  Assessment/Plan Active Problems:   Acute hypoxemic respiratory failure due to COVID-19 ([place]) Sepsis due to COVID-19 infection-febrile with tachycardia and tachypnea Symptomatic for  4 days.  Desaturated to 85% on RA requiring supplemental oxygen.  Chest x-ray with bilateral opacities suggesting CHF although she does not have cardinal symptoms of CHF.  She appears euvolemic on exam but difficult due to body habitus.  BNP 29 but could be falsely low given morbid obesity. -Follow inflammatory markers -IV Solu-Medrol and remdesivir -Verbally consented to Barcitinib or Actemra if needed -Inhalers, antitussive, mucolytic's, incentive spirometry, OOB/PT/OT  Vomiting/diarrhea/gastroenteritis due to COVID-19 infection: Abdominal exam benign. -Manage COVID-19 as above -Antiemetics  Uncontrolled DM-2 with hyperglycemia -Check hemoglobin A1c -SSI-moderate and NovoLog AC  -CBG monitoring  Mildly elevated AST-likely due to #1 -Continue trending  Elevated creatinine: Suspect CKD-3A vs AKI.  Most recent Cr 0.95 in 09/2018. -Continue monitoring  Essential hypertension: BP elevated on arrival but improved -Resume home medications after med rec  Microcytic anemia-denies melena or hematochezia.  No recent baselines -Monitor H&H  History of seizure disorder -We will resume home Keppra after med rec  History of CVA: Stable. -Continue home meds after med rec.  Morbid obesity: BMI 58.47.   DVT prophylaxis: Subcu Lovenox  Code Status: Full code Family Communication: Updated patient's husband over the phone Disposition Plan: MedSurg Consults called: None Admission status: Inpatient  Severity of Illness: The appropriate patient status for this patient is INPATIENT. Inpatient status is judged to be reasonable and necessary in order to provide the required intensity of service to ensure the patient's safety. The patient's presenting symptoms, physical exam findings, and initial radiographic and laboratory data in the context of their chronic comorbidities is felt to place them at high risk for further clinical deterioration. Furthermore, it is not anticipated that the patient will  be medically stable for discharge from the hospital within 2 midnights of admission. The following factors support the patient status of inpatient.    "           The patient's presenting symptoms include cough, shortness of breath, vomiting, diarrhea and weakness "           The worrisome physical exam findings include fever, tachycardia, tachypnea "           The initial radiographic and laboratory data are worrisome because positive for COVID-19, elevated creatinine, elevated liver enzymes and abnormal chest x-ray "           The chronic co-morbidities include diabetes, morbid obesity, hypertension, seizure, CVA and anemia     I certify that at the point of admission it is my clinical judgment that the patient will require inpatient hospital care spanning beyond 2 midnights from the point of admission due to high intensity of service, high risk for further deterioration and high frequency of surveillance required.   Mercy Riding MD [facility]  If 7PM-7AM, please contact night-coverage [email or web address]  11/10/2019, 9:45 AM

## 2019-11-11 LAB — CBC WITH DIFFERENTIAL/PLATELET
Abs Immature Granulocytes: 0.02 10*3/uL (ref 0.00–0.07)
Basophils Absolute: 0 10*3/uL (ref 0.0–0.1)
Basophils Relative: 0 %
Eosinophils Absolute: 0 10*3/uL (ref 0.0–0.5)
Eosinophils Relative: 0 %
HCT: 38.6 % (ref 36.0–46.0)
Hemoglobin: 11 g/dL — ABNORMAL LOW (ref 12.0–15.0)
Immature Granulocytes: 1 %
Lymphocytes Relative: 34 %
Lymphs Abs: 1.3 10*3/uL (ref 0.7–4.0)
MCH: 22.4 pg — ABNORMAL LOW (ref 26.0–34.0)
MCHC: 28.5 g/dL — ABNORMAL LOW (ref 30.0–36.0)
MCV: 78.6 fL — ABNORMAL LOW (ref 80.0–100.0)
Monocytes Absolute: 0.3 10*3/uL (ref 0.1–1.0)
Monocytes Relative: 7 %
Neutro Abs: 2.3 10*3/uL (ref 1.7–7.7)
Neutrophils Relative %: 58 %
Platelets: 227 10*3/uL (ref 150–400)
RBC: 4.91 MIL/uL (ref 3.87–5.11)
RDW: 16.3 % — ABNORMAL HIGH (ref 11.5–15.5)
WBC: 3.9 10*3/uL — ABNORMAL LOW (ref 4.0–10.5)
nRBC: 0 % (ref 0.0–0.2)

## 2019-11-11 LAB — D-DIMER, QUANTITATIVE: D-Dimer, Quant: 0.82 ug/mL-FEU — ABNORMAL HIGH (ref 0.00–0.50)

## 2019-11-11 LAB — COMPREHENSIVE METABOLIC PANEL
ALT: 31 U/L (ref 0–44)
AST: 55 U/L — ABNORMAL HIGH (ref 15–41)
Albumin: 3.1 g/dL — ABNORMAL LOW (ref 3.5–5.0)
Alkaline Phosphatase: 40 U/L (ref 38–126)
Anion gap: 8 (ref 5–15)
BUN: 29 mg/dL — ABNORMAL HIGH (ref 6–20)
CO2: 28 mmol/L (ref 22–32)
Calcium: 8.6 mg/dL — ABNORMAL LOW (ref 8.9–10.3)
Chloride: 101 mmol/L (ref 98–111)
Creatinine, Ser: 1.18 mg/dL — ABNORMAL HIGH (ref 0.44–1.00)
GFR calc Af Amer: 59 mL/min — ABNORMAL LOW (ref >60)
GFR calc non Af Amer: 51 mL/min — ABNORMAL LOW (ref >60)
Glucose, Bld: 165 mg/dL — ABNORMAL HIGH (ref 70–99)
Potassium: 4.3 mmol/L (ref 3.5–5.1)
Sodium: 137 mmol/L (ref 135–145)
Total Bilirubin: 0.4 mg/dL (ref 0.3–1.2)
Total Protein: 8.4 g/dL — ABNORMAL HIGH (ref 6.5–8.1)

## 2019-11-11 LAB — CBG MONITORING, ED
Glucose-Capillary: 165 mg/dL — ABNORMAL HIGH (ref 70–99)
Glucose-Capillary: 163 mg/dL — ABNORMAL HIGH (ref 70–99)
Glucose-Capillary: 174 mg/dL — ABNORMAL HIGH (ref 70–99)
Glucose-Capillary: 161 mg/dL — ABNORMAL HIGH (ref 70–99)

## 2019-11-11 LAB — FERRITIN: Ferritin: 475 ng/mL — ABNORMAL HIGH (ref 11–307)

## 2019-11-11 LAB — C-REACTIVE PROTEIN: CRP: 12.6 mg/dL — ABNORMAL HIGH (ref <1.0)

## 2019-11-11 MED ORDER — SALINE SPRAY 0.65 % NA SOLN
1.0000 | NASAL | Status: DC | PRN
  Filled 2019-11-11: qty 44

## 2019-11-11 NOTE — ED Notes (Signed)
[name], daughter would like an update on her mother, [phone].

## 2019-11-11 NOTE — ED Notes (Signed)
Pt called out to say she was finished using the bedside commode. I assisted pt back into the bed after changing bedding.  I applied barrier cream to pt's sacral area and threw all trash away. Pt c/o abdominal pain and nausea. RN notified.

## 2019-11-11 NOTE — Progress Notes (Signed)
PROGRESS NOTE  [name] EUM:[id number] DOB: [birth year] DOA: 11/10/2019  PCP: Charlott Rakes, MD  Brief History/Interval Summary: 59 y.o. female with history of HTN, DM-2, CVA, seizure and morbid obesity presented with complains of nausea vomiting and diarrhea.  She also mentioned history of cough and shortness of breath.  She has not been vaccinated against COVID-19.  She was evaluated in the emergency department.  She was noted to be hypoxic.  Chest x-ray showed congestion.  Patient indeed tested positive for COVID-19.  She was hospitalized for further management.    Reason for Visit: Acute respiratory failure with hypoxia  Consultants: None  Procedures: None  Antibiotics: Anti-infectives (From admission, onward)   Start     Dose/Rate Route Frequency Ordered Stop   11/11/19 1000  remdesivir 100 mg in sodium chloride 0.9 % 100 mL IVPB       "Followed by" Linked Group Details   100 mg 200 mL/hr over 30 Minutes Intravenous Daily 11/10/19 0844 11/15/19 0959   11/10/19 1000  remdesivir 200 mg in sodium chloride 0.9% 250 mL IVPB       "Followed by" Linked Group Details   200 mg 580 mL/hr over 30 Minutes Intravenous Once 11/10/19 0844 11/10/19 1007      Subjective/Interval History: Patient very somnolent this morning.  She was arousable but still very sleepy.  History was limited.  Patient denies any chest pain.  She does have a cough.  Denies any nausea vomiting this morning however did have some last night.  Has not had any loose stools in the last several hours.  Denies any abdominal pain.  ROS: No headaches currently    Assessment/Plan:  Acute Hypoxic Resp. Failure/Pneumonia due to COVID-19   Recent Labs  Lab 11/09/19 1337 11/10/19 1251 11/11/19 0500  DDIMER  --  0.98* 0.82*  FERRITIN  --  315* 475*  CRP  --  12.6* 12.6*  ALT 24  --  31  PROCALCITON  --  <0.10  --     Objective findings: Fever: Temperature of 102.8 F yesterday.  None since then. Oxygen  requirements: Currently on 2 L saturating in the mid 90s  COVID 19 Therapeutics: Antibacterials: None.  Procalcitonin was less than 0.1 Remdesivir: Day 2 Steroids: Solu-Medrol twice a day Diuretics: Not on scheduled diuretics Actemra/Baricitinib: On baricitinib PUD Prophylaxis: On PPI DVT Prophylaxis:  Lovenox   Patient at high risk for decompensation due to her morbid obesity and history of sleep apnea.  She uses CPAP at home.  Noted to desaturate off of oxygen.  Currently on 2 L.  Continue Remdesivir steroids and baricitinib.  Inflammatory markers noted to be elevated.  D-dimer 0.82.  Procalcitonin was less than 0.1.  Mobilize.  Incentive spirometry.  Out of bed to chair.  Prone positioning if possible.  The treatment plan and use of medications and known side effects were discussed with patient. Some of the medications used are based on case reports/anecdotal data.  All other medications being used in the management of COVID-19 based on limited study data.  Complete risks and long-term side effects are unknown, however in the best clinical judgment they seem to be of some benefit.  Patient wanted to proceed with treatment options provided.  Nausea vomiting and diarrhea This is most likely due to COVID-19.  Symptomatic treatment.  Diabetes mellitus type 2, uncontrolled with hyperglycemia It appears that she takes her Metformin and glipizide at home.  Currently on SSI.  BG's.  Anticipate some worsening  in glycemic control due to steroids.  0.4.  Mild transaminitis Due to COVID-19.  Chronic kidney disease stage IIIa Monitor urine output.  Check renal function daily for now.  Essential hypertension Blood pressure is reasonably well controlled.  Noted to be on lisinopril and metoprolol. Okay to continue the lisinopril for now despite. Creatinine. We will recheck labs tomorrow.  Microcytic anemia No evidence for any overt bleeding. Outpatient evaluation.  History of seizure  disorder Continue Keppra.  History of CVA Continue statin  History of obstructive sleep apnea Uses CPAP at home. Cannot use in the hospital due to Covid.  Morbid obesity Estimated body mass index is 58.47 kg/m as calculated from the following:   Height as of 11/08/19: 4\' 11"  (1.499 m).   Weight as of 11/08/19: 131.3 kg.   DVT Prophylaxis: Lovenox Code Status: Full code Family Communication: Discussed with the patient Disposition Plan: Hopefully return home when improved  Status is: Inpatient  Remains inpatient appropriate because:IV treatments appropriate due to intensity of illness or inability to take PO and Inpatient level of care appropriate due to severity of illness   Dispo: The patient is from: Home              Anticipated d/c is to: Home              Anticipated d/c date is: 3 days              Patient currently is not medically stable to d/c.     Medications:  Scheduled: . vitamin C  500 mg Oral Daily  . atorvastatin  20 mg Oral Daily  . baricitinib  2 mg Oral Daily  . enoxaparin (LOVENOX) injection  60 mg Subcutaneous Daily  . insulin aspart  0-15 Units Subcutaneous TID WC  . insulin aspart  0-5 Units Subcutaneous QHS  . insulin aspart  6 Units Subcutaneous TID WC  . Ipratropium-Albuterol  1 puff Inhalation Q6H  . levETIRAcetam  750 mg Oral BID  . lisinopril  10 mg Oral Daily  . methylPREDNISolone (SOLU-MEDROL) injection  0.5 mg/kg Intravenous Q12H  . metoprolol tartrate  100 mg Oral BID  . pantoprazole (PROTONIX) IV  40 mg Intravenous QHS  . zinc sulfate  220 mg Oral Daily   Continuous: . remdesivir 100 mg in NS 100 mL     WUJ:WJXBJYNWGNFAO, albuterol, chlorpheniramine-HYDROcodone, guaiFENesin-dextromethorphan, ondansetron **OR** ondansetron (ZOFRAN) IV, polyvinyl alcohol, sodium chloride, traMADol, traZODone   Objective:  Vital Signs  Vitals:   11/10/19 2230 11/11/19 0245 11/11/19 0833 11/11/19 1053  BP: 134/86 124/74 (!) 136/125 118/78   Pulse:  68 76 70  Resp:  14 (!) 28 (!) 9  Temp:   (!) 97.5 F (36.4 C)   TempSrc:      SpO2:  91% 97% 95%   No intake or output data in the 24 hours ending 11/11/19 1129 There were no vitals filed for this visit.  General appearance: Somnolent but easily arousable. Morbidly obese Resp: Mildly tachypneic at rest. No use of accessory muscles. Few crackles at the bases. No wheezing or rhonchi. Difficult exam due to body habitus Cardio: S1-S2 is normal regular.  No S3-S4.  No rubs murmurs or bruit GI: Abdomen is soft.  Nontender nondistended.  Bowel sounds are present normal.  No masses organomegaly Extremities: No edema. Moving all her extremities. Neurologic: Alert and oriented x3.  No focal neurological deficits.    Lab Results:  Data Reviewed: I have personally reviewed following labs and  imaging studies  CBC: Recent Labs  Lab 11/09/19 1337 11/10/19 1251 11/11/19 0500  WBC 5.6 6.8 3.9*  NEUTROABS  --  5.3 2.3  HGB 11.0* 10.7* 11.0*  HCT 37.8 36.9 38.6  MCV 77.8* 77.8* 78.6*  PLT 214 203 073    Basic Metabolic Panel: Recent Labs  Lab 11/09/19 1337 11/10/19 1251 11/11/19 0500  NA 135  --  137  K 3.5  --  4.3  CL 99  --  101  CO2 26  --  28  GLUCOSE 128*  --  165*  BUN 16  --  29*  CREATININE 1.20* 1.20* 1.18*  CALCIUM 7.8*  --  8.6*    GFR: Estimated Creatinine Clearance: 64.3 mL/min (A) (by C-G formula based on SCr of 1.18 mg/dL (H)).  Liver Function Tests: Recent Labs  Lab 11/09/19 1337 11/11/19 0500  AST 43* 55*  ALT 24 31  ALKPHOS 44 40  BILITOT 0.5 0.4  PROT 8.3* 8.4*  ALBUMIN 3.4* 3.1*    Recent Labs  Lab 11/09/19 1337  LIPASE 27    HbA1C: Recent Labs    11/10/19 1251  HGBA1C 6.4*    CBG: Recent Labs  Lab 11/09/19 1330 11/10/19 1226 11/10/19 1804 11/10/19 2054 11/11/19 0830  GLUCAP 120* 142* 182* 141* 165*    Lipid Profile: Recent Labs    11/10/19 1251  TRIG 63     Anemia Panel: Recent Labs    11/10/19 1251  11/11/19 0500  FERRITIN 315* 475*    Recent Results (from the past 240 hour(s))  SARS Coronavirus 2 by RT PCR (hospital order, performed in [facility] hospital lab) Nasopharyngeal Nasopharyngeal Swab     Status: Abnormal   Collection Time: 11/10/19  6:13 AM   Specimen: Nasopharyngeal Swab  Result Value Ref Range Status   SARS Coronavirus 2 POSITIVE (A) NEGATIVE Final    Comment: RESULT CALLED TO, READ BACK BY AND VERIFIED WITH: GARRISON,G. RN @0748  ON 8.18.2021 BY NMCCOY (NOTE) SARS-CoV-2 target nucleic acids are DETECTED  SARS-CoV-2 RNA is generally detectable in upper respiratory specimens  during the acute phase of infection.  Positive results are indicative  of the presence of the identified virus, but do not rule out bacterial infection or co-infection with other pathogens not detected by the test.  Clinical correlation with patient history and  other diagnostic information is necessary to determine patient infection status.  The expected result is negative.  Fact Sheet for Patients:   StrictlyIdeas.no   Fact Sheet for Healthcare Providers:   BankingDealers.co.za    This test is not yet approved or cleared by the [place] FDA and  has been authorized for detection and/or diagnosis of SARS-CoV-2 by FDA under an Emergency Use Authorization (EUA).  This EUA will remain in effect (meanin g this test can be used) for the duration of  the COVID-19 declaration under Section 564(b)(1) of the Act, 21 U.S.C. section 360-bbb-3(b)(1), unless the authorization is terminated or revoked sooner.  Performed at [facility], [place] [address], [place], [place] [address]   Blood Culture (routine x 2)     Status: None (Preliminary result)   Collection Time: 11/10/19 12:51 PM   Specimen: BLOOD  Result Value Ref Range Status   Specimen Description   Final    BLOOD LEFT ANTECUBITAL Performed at [facility] [address] [place], [place] [address]    Special Requests   Final    BOTTLES DRAWN AEROBIC AND ANAEROBIC  Blood Culture adequate volume Performed at [facility] [address], [place], [place] [address]    Culture   Final    NO GROWTH < 24 HOURS Performed at [facility] [address], [place], [place] [address]    Report Status PENDING  Incomplete      Radiology Studies: DG Chest 2 View  Result Date: 11/09/2019 CLINICAL DATA:  Cough, vomiting, diarrhea and chills for 4 days. EXAM: CHEST - 2 VIEW COMPARISON:  PA and lateral chest 01/21/2018. FINDINGS: There is cardiomegaly and vascular congestion. No consolidative process, pneumothorax or pleural effusion. No acute or focal bony abnormality. Thoracic spondylosis noted. IMPRESSION: Cardiomegaly and pulmonary vascular congestion. Electronically Signed   By: Inge Rise M.D.   On: 11/09/2019 14:52       LOS: 1 day   [facility] Pager on [email or web address]  11/11/2019, 11:29 AM

## 2019-11-12 DIAGNOSIS — N1831 Chronic kidney disease, stage 3a: Secondary | ICD-10-CM

## 2019-11-12 LAB — CBC WITH DIFFERENTIAL/PLATELET
Abs Immature Granulocytes: 0.02 10*3/uL (ref 0.00–0.07)
Basophils Absolute: 0 10*3/uL (ref 0.0–0.1)
Basophils Relative: 0 %
Eosinophils Absolute: 0 10*3/uL (ref 0.0–0.5)
Eosinophils Relative: 0 %
HCT: 39.4 % (ref 36.0–46.0)
Hemoglobin: 11.2 g/dL — ABNORMAL LOW (ref 12.0–15.0)
Immature Granulocytes: 1 %
Lymphocytes Relative: 36 %
Lymphs Abs: 1.4 10*3/uL (ref 0.7–4.0)
MCH: 22.7 pg — ABNORMAL LOW (ref 26.0–34.0)
MCHC: 28.4 g/dL — ABNORMAL LOW (ref 30.0–36.0)
MCV: 79.8 fL — ABNORMAL LOW (ref 80.0–100.0)
Monocytes Absolute: 0.4 10*3/uL (ref 0.1–1.0)
Monocytes Relative: 10 %
Neutro Abs: 2 10*3/uL (ref 1.7–7.7)
Neutrophils Relative %: 53 %
Platelets: 176 10*3/uL (ref 150–400)
RBC: 4.94 MIL/uL (ref 3.87–5.11)
RDW: 16.1 % — ABNORMAL HIGH (ref 11.5–15.5)
WBC: 3.8 10*3/uL — ABNORMAL LOW (ref 4.0–10.5)
nRBC: 0 % (ref 0.0–0.2)

## 2019-11-12 LAB — COMPREHENSIVE METABOLIC PANEL
ALT: 26 U/L (ref 0–44)
AST: 41 U/L (ref 15–41)
Albumin: 3 g/dL — ABNORMAL LOW (ref 3.5–5.0)
Alkaline Phosphatase: 38 U/L (ref 38–126)
Anion gap: 12 (ref 5–15)
BUN: 44 mg/dL — ABNORMAL HIGH (ref 6–20)
CO2: 27 mmol/L (ref 22–32)
Calcium: 9.2 mg/dL (ref 8.9–10.3)
Chloride: 100 mmol/L (ref 98–111)
Creatinine, Ser: 1.29 mg/dL — ABNORMAL HIGH (ref 0.44–1.00)
GFR calc Af Amer: 53 mL/min — ABNORMAL LOW (ref >60)
GFR calc non Af Amer: 46 mL/min — ABNORMAL LOW (ref >60)
Glucose, Bld: 180 mg/dL — ABNORMAL HIGH (ref 70–99)
Potassium: 5 mmol/L (ref 3.5–5.1)
Sodium: 139 mmol/L (ref 135–145)
Total Bilirubin: 0.6 mg/dL (ref 0.3–1.2)
Total Protein: 7.7 g/dL (ref 6.5–8.1)

## 2019-11-12 LAB — GLUCOSE, CAPILLARY
Glucose-Capillary: 176 mg/dL — ABNORMAL HIGH (ref 70–99)
Glucose-Capillary: 227 mg/dL — ABNORMAL HIGH (ref 70–99)
Glucose-Capillary: 273 mg/dL — ABNORMAL HIGH (ref 70–99)

## 2019-11-12 LAB — CBG MONITORING, ED
Glucose-Capillary: 190 mg/dL — ABNORMAL HIGH (ref 70–99)
Glucose-Capillary: 214 mg/dL — ABNORMAL HIGH (ref 70–99)

## 2019-11-12 LAB — D-DIMER, QUANTITATIVE: D-Dimer, Quant: 0.55 ug/mL-FEU — ABNORMAL HIGH (ref 0.00–0.50)

## 2019-11-12 LAB — C-REACTIVE PROTEIN: CRP: 7.3 mg/dL — ABNORMAL HIGH (ref <1.0)

## 2019-11-12 NOTE — ED Notes (Signed)
Pt's oxygen level dropped to 60s. Upon checking on pt, she is laying on her left side and nasal cannula was not on correctly.  I repositioned the [place] and oxygen level came back up to 90%.

## 2019-11-12 NOTE — Progress Notes (Signed)
TOC CM reviewed chart for dc needs. No needs identified. Please place consult if CM/CSW needed for dc. [place], [facility] ED TOC CM [phone]

## 2019-11-12 NOTE — Progress Notes (Signed)
PROGRESS NOTE  MARTINE BLEECKER WUJ:[id number] DOB: [birth year] DOA: 11/10/2019  PCP: Charlott Rakes, MD  Brief History/Interval Summary: 59 y.o. female with history of HTN, DM-2, CVA, seizure and morbid obesity presented with complains of nausea vomiting and diarrhea.  She also mentioned history of cough and shortness of breath.  She has not been vaccinated against COVID-19.  She was evaluated in the emergency department.  She was noted to be hypoxic.  Chest x-ray showed congestion.  Patient indeed tested positive for COVID-19.  She was hospitalized for further management.    Reason for Visit: Acute respiratory failure with hypoxia  Consultants: None  Procedures: None  Antibiotics: Anti-infectives (From admission, onward)   Start     Dose/Rate Route Frequency Ordered Stop   11/11/19 1000  remdesivir 100 mg in sodium chloride 0.9 % 100 mL IVPB       "Followed by" Linked Group Details   100 mg 200 mL/hr over 30 Minutes Intravenous Daily 11/10/19 0844 11/15/19 0959   11/10/19 1000  remdesivir 200 mg in sodium chloride 0.9% 250 mL IVPB       "Followed by" Linked Group Details   200 mg 580 mL/hr over 30 Minutes Intravenous Once 11/10/19 0844 11/10/19 1007      Subjective/Interval History: Patient states that she continues to have some difficulty breathing with dry cough.  Denies any chest pain.  Has had nausea but no vomiting.  Some abdominal discomfort during the night but none currently.     Assessment/Plan:  Acute Hypoxic Resp. Failure/Pneumonia due to COVID-19   Recent Labs  Lab 11/09/19 1337 11/10/19 1251 11/11/19 0500 11/12/19 0311  DDIMER  --  0.98* 0.82* 0.55*  FERRITIN  --  315* 475*  --   CRP  --  12.6* 12.6* 7.3*  ALT 24  --  31 26  PROCALCITON  --  <0.10  --   --     Objective findings: Fever: No documented fever in the last 24 hours Oxygen requirements: Remains on 2 L of oxygen by nasal cannula saturating in the early 90s.    COVID 19  Therapeutics: Antibacterials: None.  Procalcitonin was less than 0.1 Remdesivir: Day 3 Steroids: Solu-Medrol twice a day Diuretics: Not on scheduled diuretics Actemra/Baricitinib: On baricitinib PUD Prophylaxis: On PPI DVT Prophylaxis:  Lovenox   Patient at high risk for decompensation due to her morbid obesity and history of sleep apnea.  She uses CPAP at home.    Patient remains stable.  She needs to be mobilized.  Incentive spirometry.    She remains on Remdesivir steroids and baricitinib.  Her inflammatory markers have improved.  D-dimer is stable.  No antibacterials due to normal procalcitonin.    The treatment plan and use of medications and known side effects were discussed with patient. Some of the medications used are based on case reports/anecdotal data.  All other medications being used in the management of COVID-19 based on limited study data.  Complete risks and long-term side effects are unknown, however in the best clinical judgment they seem to be of some benefit.  Patient wanted to proceed with treatment options provided.  Nausea vomiting and diarrhea This is most likely due to COVID-19.  Occasional nausea.  No vomiting.  Abdomen is benign on examination.  Diabetes mellitus type 2, uncontrolled with hyperglycemia It appears that she takes her Metformin and glipizide at home.  Elevated glucose levels due to steroids.  Stable for the most part.  Continue to monitor.  HbA1c 6.4.   Mild transaminitis Due to COVID-19.  Chronic kidney disease stage IIIa Renal function stable for the most part.  Monitor urine output.  Encourage oral intake.  Essential hypertension Blood pressure is reasonably well controlled.  Noted to be on lisinopril and metoprolol.  Since creatinine today slightly higher than yesterday we will hold her lisinopril.  Recheck labs tomorrow.   Microcytic anemia No evidence for any overt bleeding. Outpatient evaluation.  Hemoglobin remained stable.  History  of seizure disorder Continue Keppra.  History of CVA Continue statin  History of obstructive sleep apnea Uses CPAP at home. Cannot use in the hospital due to Covid.  Morbid obesity Estimated body mass index is 58.47 kg/m as calculated from the following:   Height as of 11/08/19: 4\' 11"  (1.499 m).   Weight as of 11/08/19: 131.3 kg.   DVT Prophylaxis: Lovenox Code Status: Full code Family Communication: Discussed with her daughter yesterday.  Will call again today. Disposition Plan: Hopefully return home when improved  Status is: Inpatient  Remains inpatient appropriate because:IV treatments appropriate due to intensity of illness or inability to take PO and Inpatient level of care appropriate due to severity of illness   Dispo:  Patient From: Home  Planned Disposition: Home  Expected discharge date: 11/13/19  Medically stable for discharge: No     Medications:  Scheduled: . vitamin C  500 mg Oral Daily  . atorvastatin  20 mg Oral Daily  . baricitinib  2 mg Oral Daily  . enoxaparin (LOVENOX) injection  60 mg Subcutaneous Daily  . insulin aspart  0-15 Units Subcutaneous TID WC  . insulin aspart  0-5 Units Subcutaneous QHS  . insulin aspart  6 Units Subcutaneous TID WC  . Ipratropium-Albuterol  1 puff Inhalation Q6H  . levETIRAcetam  750 mg Oral BID  . lisinopril  10 mg Oral Daily  . methylPREDNISolone (SOLU-MEDROL) injection  0.5 mg/kg Intravenous Q12H  . metoprolol tartrate  100 mg Oral BID  . pantoprazole (PROTONIX) IV  40 mg Intravenous QHS  . zinc sulfate  220 mg Oral Daily   Continuous: . remdesivir 100 mg in NS 100 mL 100 mg (11/12/19 1030)   INO:[id number], albuterol, chlorpheniramine-HYDROcodone, guaiFENesin-dextromethorphan, ondansetron **OR** ondansetron (ZOFRAN) IV, polyvinyl alcohol, sodium chloride, traMADol, traZODone   Objective:  Vital Signs  Vitals:   11/12/19 0600 11/12/19 0630 11/12/19 0811 11/12/19 1029  BP: 112/71 (!) 145/95 131/82  131/75  Pulse: 65 69 66 70  Resp: (!) 22 15 17 13   Temp:      TempSrc:      SpO2: 92% 90% 92% 92%    Intake/Output Summary (Last 24 hours) at 11/12/2019 1040 Last data filed at 11/11/2019 2334 Gross per 24 hour  Intake 100 ml  Output --  Net 100 ml   There were no vitals filed for this visit.  General appearance: Awake alert.  In no distress.  Morbidly obese. Resp: Mildly tachypneic at rest.  Diminished air entry at the bases.  Difficult exam due to body habitus.  Few crackles appreciated.   Cardio: S1-S2 is normal regular.  No S3-S4.  No rubs murmurs or bruit GI: Abdomen is soft.  Obese.  Nontender nondistended.  Bowel sounds are present normal.  No masses organomegaly. limited exam due to body habitus. Extremities: No edema.  Moving all her extremities Neurologic: Alert and oriented x3.  No focal neurological deficits.     Lab Results:  Data Reviewed: I have personally reviewed following labs and  imaging studies  CBC: Recent Labs  Lab 11/09/19 1337 11/10/19 1251 11/11/19 0500 11/12/19 0311  WBC 5.6 6.8 3.9* 3.8*  NEUTROABS  --  5.3 2.3 2.0  HGB 11.0* 10.7* 11.0* 11.2*  HCT 37.8 36.9 38.6 39.4  MCV 77.8* 77.8* 78.6* 79.8*  PLT 214 203 227 329    Basic Metabolic Panel: Recent Labs  Lab 11/09/19 1337 11/10/19 1251 11/11/19 0500 11/12/19 0311  NA 135  --  137 139  K 3.5  --  4.3 5.0  CL 99  --  101 100  CO2 26  --  28 27  GLUCOSE 128*  --  165* 180*  BUN 16  --  29* 44*  CREATININE 1.20* 1.20* 1.18* 1.29*  CALCIUM 7.8*  --  8.6* 9.2    GFR: Estimated Creatinine Clearance: 58.8 mL/min (A) (by C-G formula based on SCr of 1.29 mg/dL (H)).  Liver Function Tests: Recent Labs  Lab 11/09/19 1337 11/11/19 0500 11/12/19 0311  AST 43* 55* 41  ALT 24 31 26   ALKPHOS 44 40 38  BILITOT 0.5 0.4 0.6  PROT 8.3* 8.4* 7.7  ALBUMIN 3.4* 3.1* 3.0*    Recent Labs  Lab 11/09/19 1337  LIPASE 27    HbA1C: Recent Labs    11/10/19 1251  HGBA1C 6.4*     CBG: Recent Labs  Lab 11/11/19 0830 11/11/19 1205 11/11/19 1652 11/11/19 2311 11/12/19 0808  GLUCAP 165* 163* 174* 161* 190*    Lipid Profile: Recent Labs    11/10/19 1251  TRIG 63     Anemia Panel: Recent Labs    11/10/19 1251 11/11/19 0500  FERRITIN 315* 475*    Recent Results (from the past 240 hour(s))  SARS Coronavirus 2 by RT PCR (hospital order, performed in [place] hospital lab) Nasopharyngeal Nasopharyngeal Swab     Status: Abnormal   Collection Time: 11/10/19  6:13 AM   Specimen: Nasopharyngeal Swab  Result Value Ref Range Status   SARS Coronavirus 2 POSITIVE (A) NEGATIVE Final    Comment: RESULT CALLED TO, READ BACK BY AND VERIFIED WITH: GARRISON,G. RN @0748  ON 8.18.2021 BY NMCCOY (NOTE) SARS-CoV-2 target nucleic acids are DETECTED  SARS-CoV-2 RNA is generally detectable in upper respiratory specimens  during the acute phase of infection.  Positive results are indicative  of the presence of the identified virus, but do not rule out bacterial infection or co-infection with other pathogens not detected by the test.  Clinical correlation with patient history and  other diagnostic information is necessary to determine patient infection status.  The expected result is negative.  Fact Sheet for Patients:   StrictlyIdeas.no   Fact Sheet for Healthcare Providers:   BankingDealers.co.za    This test is not yet approved or cleared by the [place] FDA and  has been authorized for detection and/or diagnosis of SARS-CoV-2 by FDA under an Emergency Use Authorization (EUA).  This EUA will remain in effect (meanin g this test can be used) for the duration of  the COVID-19 declaration under Section 564(b)(1) of the Act, 21 U.S.C. section 360-bbb-3(b)(1), unless the authorization is terminated or revoked sooner.  Performed at [facility], [place] [address] [place], [place] [address]    Blood Culture (routine x 2)     Status: None (Preliminary result)   Collection Time: 11/10/19 12:51 PM   Specimen: BLOOD  Result Value Ref Range Status   Specimen Description   Final    BLOOD LEFT ANTECUBITAL Performed at [facility]  [place] [address] [place], [place] [address]    Special Requests   Final    BOTTLES DRAWN AEROBIC AND ANAEROBIC Blood Culture adequate volume Performed at [facility] [address], [place], [place] [address]    Culture   Final    NO GROWTH 2 DAYS Performed at [facility] [address] [place], [place] [address]    Report Status PENDING  Incomplete      Radiology Studies: No results found.     LOS: 2 days   [facility] Pager on [email or web address]  11/12/2019, 10:40 AM

## 2019-11-13 LAB — GLUCOSE, CAPILLARY
Glucose-Capillary: 173 mg/dL — ABNORMAL HIGH (ref 70–99)
Glucose-Capillary: 228 mg/dL — ABNORMAL HIGH (ref 70–99)
Glucose-Capillary: 217 mg/dL — ABNORMAL HIGH (ref 70–99)
Glucose-Capillary: 212 mg/dL — ABNORMAL HIGH (ref 70–99)

## 2019-11-13 LAB — CBC WITH DIFFERENTIAL/PLATELET
Abs Immature Granulocytes: 0.03 10*3/uL (ref 0.00–0.07)
Basophils Absolute: 0 10*3/uL (ref 0.0–0.1)
Basophils Relative: 0 %
Eosinophils Absolute: 0 10*3/uL (ref 0.0–0.5)
Eosinophils Relative: 0 %
HCT: 37.3 % (ref 36.0–46.0)
Hemoglobin: 10.9 g/dL — ABNORMAL LOW (ref 12.0–15.0)
Immature Granulocytes: 1 %
Lymphocytes Relative: 21 %
Lymphs Abs: 1.1 10*3/uL (ref 0.7–4.0)
MCH: 22.9 pg — ABNORMAL LOW (ref 26.0–34.0)
MCHC: 29.2 g/dL — ABNORMAL LOW (ref 30.0–36.0)
MCV: 78.5 fL — ABNORMAL LOW (ref 80.0–100.0)
Monocytes Absolute: 0.4 10*3/uL (ref 0.1–1.0)
Monocytes Relative: 8 %
Neutro Abs: 3.5 10*3/uL (ref 1.7–7.7)
Neutrophils Relative %: 70 %
Platelets: 271 10*3/uL (ref 150–400)
RBC: 4.75 MIL/uL (ref 3.87–5.11)
RDW: 15.9 % — ABNORMAL HIGH (ref 11.5–15.5)
WBC: 5 10*3/uL (ref 4.0–10.5)
nRBC: 0 % (ref 0.0–0.2)

## 2019-11-13 LAB — COMPREHENSIVE METABOLIC PANEL
ALT: 25 U/L (ref 0–44)
AST: 27 U/L (ref 15–41)
Albumin: 3 g/dL — ABNORMAL LOW (ref 3.5–5.0)
Alkaline Phosphatase: 43 U/L (ref 38–126)
Anion gap: 8 (ref 5–15)
BUN: 44 mg/dL — ABNORMAL HIGH (ref 6–20)
CO2: 30 mmol/L (ref 22–32)
Calcium: 9.1 mg/dL (ref 8.9–10.3)
Chloride: 102 mmol/L (ref 98–111)
Creatinine, Ser: 1.02 mg/dL — ABNORMAL HIGH (ref 0.44–1.00)
GFR calc Af Amer: >60 mL/min (ref >60)
GFR calc non Af Amer: >60 mL/min (ref >60)
Glucose, Bld: 189 mg/dL — ABNORMAL HIGH (ref 70–99)
Potassium: 4.3 mmol/L (ref 3.5–5.1)
Sodium: 140 mmol/L (ref 135–145)
Total Bilirubin: 0.3 mg/dL (ref 0.3–1.2)
Total Protein: 7.6 g/dL (ref 6.5–8.1)

## 2019-11-13 LAB — C-REACTIVE PROTEIN: CRP: 3.9 mg/dL — ABNORMAL HIGH (ref <1.0)

## 2019-11-13 LAB — D-DIMER, QUANTITATIVE: D-Dimer, Quant: <0.27 ug/mL-FEU (ref 0.00–0.50)

## 2019-11-13 MED ORDER — PANTOPRAZOLE SODIUM 40 MG PO TBEC
40.0000 mg | DELAYED_RELEASE_TABLET | Freq: Every day | ORAL | Status: DC
  Administered 2019-11-13 – 2019-11-15 (×3): 40 mg via ORAL
  Filled 2019-11-13 (×3): qty 1

## 2019-11-13 NOTE — Progress Notes (Signed)
PROGRESS NOTE  [name] VVO:[id number] DOB: 1960/04/14 DOA: 11/10/2019  PCP: Charlott Rakes, MD  Brief History/Interval Summary: 59 y.o. female with history of HTN, DM-2, CVA, seizure and morbid obesity presented with complains of nausea vomiting and diarrhea.  She also mentioned history of cough and shortness of breath.  She has not been vaccinated against COVID-19.  She was evaluated in the emergency department.  She was noted to be hypoxic.  Chest x-ray showed congestion.  Patient indeed tested positive for COVID-19.  She was hospitalized for further management.    Reason for Visit: Acute respiratory failure with hypoxia  Consultants: None  Procedures: None  Antibiotics: Anti-infectives (From admission, onward)   Start     Dose/Rate Route Frequency Ordered Stop   11/11/19 1000  remdesivir 100 mg in sodium chloride 0.9 % 100 mL IVPB       "Followed by" Linked Group Details   100 mg 200 mL/hr over 30 Minutes Intravenous Daily 11/10/19 0844 11/15/19 0959   11/10/19 1000  remdesivir 200 mg in sodium chloride 0.9% 250 mL IVPB       "Followed by" Linked Group Details   200 mg 580 mL/hr over 30 Minutes Intravenous Once 11/10/19 0844 11/10/19 1007      Subjective/Interval History: Patient somnolent this morning but easily arousable.  Her nasal cannula was not in her nostrils and her oxygen saturations were noted to be in the early 90s.  Patient denies any complaints.  No chest pain specifically.  Shortness of breath is improved.  Assessment/Plan:  Acute Hypoxic Resp. Failure/Pneumonia due to COVID-19   Recent Labs  Lab 11/09/19 1337 11/10/19 1251 11/11/19 0500 11/12/19 0311 11/13/19 0423  DDIMER  --  0.98* 0.82* 0.55* <0.27  FERRITIN  --  315* 475*  --   --   CRP  --  12.6* 12.6* 7.3* 3.9*  ALT 24  --  31 26 25   PROCALCITON  --  <0.10  --   --   --     Objective findings: Fever: Remains afebrile Oxygen requirements: Oxygen requirement seems to be improving.   Currently saturating in the 90s while on room air.    COVID 19 Therapeutics: Antibacterials: None.  Procalcitonin was less than 0.1 Remdesivir: Day 4 Steroids: Solu-Medrol twice a day Diuretics: Not on scheduled diuretics Actemra/Baricitinib: On baricitinib PUD Prophylaxis: On PPI DVT Prophylaxis:  Lovenox   Patient at high risk for decompensation due to her morbid obesity and history of sleep apnea.  She uses CPAP at home.    Patient seems to be improving from a respiratory standpoint.  Her oxygen requirements have decreased significantly.  Seems to be doing well on room air.  D-dimer is normal today.  Inflammatory marker has improved.  Continue with incentive spirometry mobilization.  She remains on Remdesivir steroids and baricitinib.    The treatment plan and use of medications and known side effects were discussed with patient. Some of the medications used are based on case reports/anecdotal data.  All other medications being used in the management of COVID-19 based on limited study data.  Complete risks and long-term side effects are unknown, however in the best clinical judgment they seem to be of some benefit.  Patient wanted to proceed with treatment options provided.  Nausea vomiting and diarrhea This is most likely due to COVID-19.  GI symptoms seem to be improving.  Abdominal exam is benign.    Diabetes mellitus type 2, uncontrolled with hyperglycemia It appears that she  takes her Metformin and glipizide at home.  Elevated glucose levels due to steroids.  Stable for the most part.  Continue to monitor.  HbA1c 6.4.   Mild transaminitis Due to COVID-19.  Chronic kidney disease stage IIIa Renal function is stable.  Continue to monitor urine output.  Avoid nephrotoxic agents.  Essential hypertension Occasional high blood pressure readings noted.  Continue metoprolol.  Should be able to resume lisinopril at discharge.     Microcytic anemia No evidence for any overt bleeding.  Outpatient evaluation.  Hemoglobin remains stable.  History of seizure disorder Continue Keppra.  History of CVA Continue statin  History of obstructive sleep apnea Uses CPAP at home. Cannot use in the hospital due to Covid.  Morbid obesity Estimated body mass index is 58.47 kg/m as calculated from the following:   Height as of 11/08/19: 4\' 11"  (1.499 m).   Weight as of 11/08/19: 131.3 kg.   DVT Prophylaxis: Lovenox Code Status: Full code Family Communication: Daughter being updated daily. Disposition Plan: Hopefully return home when improved  Status is: Inpatient  Remains inpatient appropriate because:IV treatments appropriate due to intensity of illness or inability to take PO and Inpatient level of care appropriate due to severity of illness   Dispo:  Patient From: Home  Planned Disposition: Home  Expected discharge date: 11/14/19  Medically stable for discharge: No     Medications:  Scheduled: . vitamin C  500 mg Oral Daily  . atorvastatin  20 mg Oral Daily  . baricitinib  2 mg Oral Daily  . enoxaparin (LOVENOX) injection  60 mg Subcutaneous Daily  . insulin aspart  0-15 Units Subcutaneous TID WC  . insulin aspart  0-5 Units Subcutaneous QHS  . insulin aspart  6 Units Subcutaneous TID WC  . Ipratropium-Albuterol  1 puff Inhalation Q6H  . levETIRAcetam  750 mg Oral BID  . methylPREDNISolone (SOLU-MEDROL) injection  0.5 mg/kg Intravenous Q12H  . metoprolol tartrate  100 mg Oral BID  . pantoprazole  40 mg Oral QHS  . zinc sulfate  220 mg Oral Daily   Continuous: . remdesivir 100 mg in NS 100 mL 100 mg (11/13/19 1029)   RWE:[id number], albuterol, chlorpheniramine-HYDROcodone, guaiFENesin-dextromethorphan, ondansetron **OR** ondansetron (ZOFRAN) IV, polyvinyl alcohol, sodium chloride, traMADol, traZODone   Objective:  Vital Signs  Vitals:   11/12/19 1743 11/12/19 2200 11/13/19 0209 11/13/19 0604  BP: 139/84 (!) 120/57 (!) 153/94 (!) 160/88  Pulse: (!)  58  (!) 58 64  Resp: 20 18 16    Temp: 98 F (36.7 C) 97.6 F (36.4 C) (!) 97.5 F (36.4 C) (!) 97.5 F (36.4 C)  TempSrc: Oral Oral Oral Oral  SpO2: 92% 96% 93% 92%    Intake/Output Summary (Last 24 hours) at 11/13/2019 1040 Last data filed at 11/12/2019 1843 Gross per 24 hour  Intake 474 ml  Output --  Net 474 ml   There were no vitals filed for this visit.  General appearance: Awake alert.  In no distress.  Morbidly obese Resp: Improved effort.  Improved air entry bilaterally.  No wheezing or rhonchi.  Few crackles at the bases.   Cardio: S1-S2 is normal regular.  No S3-S4.  No rubs murmurs or bruit GI: Abdomen is soft.  Obese.  Exam is limited due to body habitus.  But no obvious tenderness appreciated.   Extremities: No edema.  Full range of motion of lower extremities. Neurologic: Alert and oriented x3.  No focal neurological deficits.    Lab Results:  Data Reviewed: I have personally reviewed following labs and imaging studies  CBC: Recent Labs  Lab 11/09/19 1337 11/10/19 1251 11/11/19 0500 11/12/19 0311 11/13/19 0423  WBC 5.6 6.8 3.9* 3.8* 5.0  NEUTROABS  --  5.3 2.3 2.0 3.5  HGB 11.0* 10.7* 11.0* 11.2* 10.9*  HCT 37.8 36.9 38.6 39.4 37.3  MCV 77.8* 77.8* 78.6* 79.8* 78.5*  PLT 214 203 227 176 161    Basic Metabolic Panel: Recent Labs  Lab 11/09/19 1337 11/10/19 1251 11/11/19 0500 11/12/19 0311 11/13/19 0423  NA 135  --  137 139 140  K 3.5  --  4.3 5.0 4.3  CL 99  --  101 100 102  CO2 26  --  28 27 30   GLUCOSE 128*  --  165* 180* 189*  BUN 16  --  29* 44* 44*  CREATININE 1.20* 1.20* 1.18* 1.29* 1.02*  CALCIUM 7.8*  --  8.6* 9.2 9.1    GFR: Estimated Creatinine Clearance: 74.4 mL/min (A) (by C-G formula based on SCr of 1.02 mg/dL (H)).  Liver Function Tests: Recent Labs  Lab 11/09/19 1337 11/11/19 0500 11/12/19 0311 11/13/19 0423  AST 43* 55* 41 27  ALT 24 31 26 25   ALKPHOS 44 40 38 43  BILITOT 0.5 0.4 0.6 0.3  PROT 8.3* 8.4* 7.7 7.6   ALBUMIN 3.4* 3.1* 3.0* 3.0*    Recent Labs  Lab 11/09/19 1337  LIPASE 27    HbA1C: Recent Labs    11/10/19 1251  HGBA1C 6.4*    CBG: Recent Labs  Lab 11/12/19 1246 11/12/19 1741 11/12/19 2116 11/12/19 2117 11/13/19 0856  GLUCAP 214* 176* 273* 227* 173*    Lipid Profile: Recent Labs    11/10/19 1251  TRIG 63     Anemia Panel: Recent Labs    11/10/19 1251 11/11/19 0500  FERRITIN 315* 475*    Recent Results (from the past 240 hour(s))  SARS Coronavirus 2 by RT PCR (hospital order, performed in [facility] hospital lab) Nasopharyngeal Nasopharyngeal Swab     Status: Abnormal   Collection Time: 11/10/19  6:13 AM   Specimen: Nasopharyngeal Swab  Result Value Ref Range Status   SARS Coronavirus 2 POSITIVE (A) NEGATIVE Final    Comment: RESULT CALLED TO, READ BACK BY AND VERIFIED WITH: GARRISON,G. RN @0748  ON 8.18.2021 BY NMCCOY (NOTE) SARS-CoV-2 target nucleic acids are DETECTED  SARS-CoV-2 RNA is generally detectable in upper respiratory specimens  during the acute phase of infection.  Positive results are indicative  of the presence of the identified virus, but do not rule out bacterial infection or co-infection with other pathogens not detected by the test.  Clinical correlation with patient history and  other diagnostic information is necessary to determine patient infection status.  The expected result is negative.  Fact Sheet for Patients:   StrictlyIdeas.no   Fact Sheet for Healthcare Providers:   BankingDealers.co.za    This test is not yet approved or cleared by the [place] FDA and  has been authorized for detection and/or diagnosis of SARS-CoV-2 by FDA under an Emergency Use Authorization (EUA).  This EUA will remain in effect (meanin g this test can be used) for the duration of  the COVID-19 declaration under Section 564(b)(1) of the Act, 21 U.S.C. section 360-bbb-3(b)(1), unless the  authorization is terminated or revoked sooner.  Performed at [facility], [place] [address], [place], [place] [address]   Blood Culture (routine x 2)     Status: None (  Preliminary result)   Collection Time: 11/10/19 12:51 PM   Specimen: BLOOD  Result Value Ref Range Status   Specimen Description   Final    BLOOD LEFT ANTECUBITAL Performed at [facility] [address] [place], [place] [address]    Special Requests   Final    BOTTLES DRAWN AEROBIC AND ANAEROBIC Blood Culture adequate volume Performed at [facility] [address] [place], [place] [address]    Culture   Final    NO GROWTH 2 DAYS Performed at [facility] [address], [place],  [address]    Report Status PENDING  Incomplete      Radiology Studies: No results found.     LOS: 3 days   Neddie Steedman [employer] on [email or web address]  11/13/2019, 10:40 AM

## 2019-11-14 DIAGNOSIS — R319 Hematuria, unspecified: Secondary | ICD-10-CM

## 2019-11-14 LAB — CBC
HCT: 41.7 % (ref 36.0–46.0)
Hemoglobin: 12.1 g/dL (ref 12.0–15.0)
MCH: 22.7 pg — ABNORMAL LOW (ref 26.0–34.0)
MCHC: 29 g/dL — ABNORMAL LOW (ref 30.0–36.0)
MCV: 78.2 fL — ABNORMAL LOW (ref 80.0–100.0)
Platelets: 399 10*3/uL (ref 150–400)
RBC: 5.33 MIL/uL — ABNORMAL HIGH (ref 3.87–5.11)
RDW: 15.9 % — ABNORMAL HIGH (ref 11.5–15.5)
WBC: 11.3 10*3/uL — ABNORMAL HIGH (ref 4.0–10.5)
nRBC: 0.2 % (ref 0.0–0.2)

## 2019-11-14 LAB — COMPREHENSIVE METABOLIC PANEL
ALT: 27 U/L (ref 0–44)
AST: 26 U/L (ref 15–41)
Albumin: 3.1 g/dL — ABNORMAL LOW (ref 3.5–5.0)
Alkaline Phosphatase: 45 U/L (ref 38–126)
Anion gap: 11 (ref 5–15)
BUN: 36 mg/dL — ABNORMAL HIGH (ref 6–20)
CO2: 28 mmol/L (ref 22–32)
Calcium: 9 mg/dL (ref 8.9–10.3)
Chloride: 103 mmol/L (ref 98–111)
Creatinine, Ser: 0.9 mg/dL (ref 0.44–1.00)
GFR calc Af Amer: >60 mL/min (ref >60)
GFR calc non Af Amer: >60 mL/min (ref >60)
Glucose, Bld: 190 mg/dL — ABNORMAL HIGH (ref 70–99)
Potassium: 4 mmol/L (ref 3.5–5.1)
Sodium: 142 mmol/L (ref 135–145)
Total Bilirubin: 0.5 mg/dL (ref 0.3–1.2)
Total Protein: 7.9 g/dL (ref 6.5–8.1)

## 2019-11-14 LAB — CBC WITH DIFFERENTIAL/PLATELET
Abs Immature Granulocytes: 0.11 10*3/uL — ABNORMAL HIGH (ref 0.00–0.07)
Basophils Absolute: 0 10*3/uL (ref 0.0–0.1)
Basophils Relative: 0 %
Eosinophils Absolute: 0 10*3/uL (ref 0.0–0.5)
Eosinophils Relative: 0 %
HCT: 42.1 % (ref 36.0–46.0)
Hemoglobin: 12 g/dL (ref 12.0–15.0)
Immature Granulocytes: 2 %
Lymphocytes Relative: 20 %
Lymphs Abs: 1 10*3/uL (ref 0.7–4.0)
MCH: 22.4 pg — ABNORMAL LOW (ref 26.0–34.0)
MCHC: 28.5 g/dL — ABNORMAL LOW (ref 30.0–36.0)
MCV: 78.7 fL — ABNORMAL LOW (ref 80.0–100.0)
Monocytes Absolute: 0.7 10*3/uL (ref 0.1–1.0)
Monocytes Relative: 14 %
Neutro Abs: 3.1 10*3/uL (ref 1.7–7.7)
Neutrophils Relative %: 64 %
Platelets: 304 10*3/uL (ref 150–400)
RBC: 5.35 MIL/uL — ABNORMAL HIGH (ref 3.87–5.11)
RDW: 16 % — ABNORMAL HIGH (ref 11.5–15.5)
WBC: 4.8 10*3/uL (ref 4.0–10.5)
nRBC: 0.4 % — ABNORMAL HIGH (ref 0.0–0.2)

## 2019-11-14 LAB — GLUCOSE, CAPILLARY
Glucose-Capillary: 163 mg/dL — ABNORMAL HIGH (ref 70–99)
Glucose-Capillary: 168 mg/dL — ABNORMAL HIGH (ref 70–99)
Glucose-Capillary: 176 mg/dL — ABNORMAL HIGH (ref 70–99)
Glucose-Capillary: 187 mg/dL — ABNORMAL HIGH (ref 70–99)

## 2019-11-14 LAB — URINALYSIS, ROUTINE W REFLEX MICROSCOPIC
Bacteria, UA: NONE SEEN
Bilirubin Urine: NEGATIVE
Glucose, UA: NEGATIVE mg/dL
Hgb urine dipstick: NEGATIVE
Ketones, ur: NEGATIVE mg/dL
Leukocytes,Ua: NEGATIVE
Nitrite: NEGATIVE
Protein, ur: 30 mg/dL — AB
Specific Gravity, Urine: 1.028 (ref 1.005–1.030)
pH: 5 (ref 5.0–8.0)

## 2019-11-14 LAB — C-REACTIVE PROTEIN: CRP: 2.4 mg/dL — ABNORMAL HIGH (ref <1.0)

## 2019-11-14 LAB — D-DIMER, QUANTITATIVE: D-Dimer, Quant: 0.32 ug/mL-FEU (ref 0.00–0.50)

## 2019-11-14 MED ORDER — HYDROCORTISONE ACETATE 25 MG RE SUPP
25.0000 mg | Freq: Two times a day (BID) | RECTAL | Status: DC
  Administered 2019-11-14 – 2019-11-16 (×5): 25 mg via RECTAL
  Filled 2019-11-14 (×5): qty 1

## 2019-11-14 MED ORDER — BARICITINIB 2 MG PO TABS
4.0000 mg | ORAL_TABLET | Freq: Every day | ORAL | Status: DC
  Administered 2019-11-15 – 2019-11-16 (×2): 4 mg via ORAL
  Filled 2019-11-14 (×2): qty 2

## 2019-11-14 NOTE — Progress Notes (Signed)
Patient has bright, red blood in her stool. Dr. [place] Pink made aware.

## 2019-11-14 NOTE — Progress Notes (Signed)
PROGRESS NOTE  [name] CHY:[id number] DOB: Feb 25, 1961 DOA: 11/10/2019  PCP: Charlott Rakes, MD  Brief History/Interval Summary: 59 y.o. female with history of HTN, DM-2, CVA, seizure and morbid obesity presented with complains of nausea vomiting and diarrhea.  She also mentioned history of cough and shortness of breath.  She has not been vaccinated against COVID-19.  She was evaluated in the emergency department.  She was noted to be hypoxic.  Chest x-ray showed congestion.  Patient indeed tested positive for COVID-19.  She was hospitalized for further management.    Reason for Visit: Acute respiratory failure with hypoxia  Consultants: None  Procedures: None  Antibiotics: Anti-infectives (From admission, onward)   Start     Dose/Rate Route Frequency Ordered Stop   11/11/19 1000  remdesivir 100 mg in sodium chloride 0.9 % 100 mL IVPB       "Followed by" Linked Group Details   100 mg 200 mL/hr over 30 Minutes Intravenous Daily 11/10/19 0844 11/14/19 1004   11/10/19 1000  remdesivir 200 mg in sodium chloride 0.9% 250 mL IVPB       "Followed by" Linked Group Details   200 mg 580 mL/hr over 30 Minutes Intravenous Once 11/10/19 0844 11/10/19 1007      Subjective/Interval History: Patient mentions that she has pain in her buttock area especially near her anus.  Informed by the nursing staff that her urine was blood-tinged.  Apparently also some concern for blood in her stool.  Patient denies any worsening shortness of breath.  Continues to have a cough.  Still feels fatigued.  Assessment/Plan:  Acute Hypoxic Resp. Failure/Pneumonia due to COVID-19   Recent Labs  Lab 11/09/19 1337 11/10/19 1251 11/11/19 0500 11/12/19 0311 11/13/19 0423 11/14/19 0333  DDIMER  --  0.98* 0.82* 0.55* <0.27 0.32  FERRITIN  --  315* 475*  --   --   --   CRP  --  12.6* 12.6* 7.3* 3.9* 2.4*  ALT 24  --  31 26 25 27   PROCALCITON  --  <0.10  --   --   --   --     Objective  findings: Fever: Remains afebrile Oxygen requirements: Nasal cannula 3 L/min.  Saturating in the early 90s.    COVID 19 Therapeutics: Antibacterials: None.  Procalcitonin was less than 0.1 Remdesivir: Day 5 Steroids: Solu-Medrol twice a day Diuretics: Not on scheduled diuretics Actemra/Baricitinib: On baricitinib PUD Prophylaxis: On PPI DVT Prophylaxis:  Lovenox   Patient at high risk for decompensation due to her morbid obesity and history of sleep apnea.  She uses CPAP at home.    Patient seems to be stable from a respiratory standpoint.  She is noted to require 2 to 3 L of oxygen.  This is primarily being driven by her sleep apnea as well as perhaps a component of obesity hypoventilation syndrome.  Mobility is very important in her case.  We will involve PT and OT.  She will complete course of remdesivir today.  Continue steroids.  Inflammatory markers have improved.  D-dimer is normal.  She was also started on baricitinib due to high risk of decompensation.  The treatment plan and use of medications and known side effects were discussed with patient. Some of the medications used are based on case reports/anecdotal data.  All other medications being used in the management of COVID-19 based on limited study data.  Complete risks and long-term side effects are unknown, however in the best clinical judgment they seem  to be of some benefit.  Patient wanted to proceed with treatment options provided.  Questionable rectal bleeding A rectal exam was done.  It was tender but no masses were palpable on digital examination.  No stool was appreciated in the rectal vault.  Yellow stool was noted.  No blood was noted.  She does have skin excoriation next to the anus towards the right side.  Tender.  This could be the reason for her pain.  We will recheck her hemoglobin later today.  Hematuria We will check a UA.  Hold her anticoagulation.  Nausea vomiting and diarrhea This is most likely due to  COVID-19.  Seems to have improved.  Diabetes mellitus type 2, uncontrolled with hyperglycemia It appears that she takes her Metformin and glipizide at home.  Elevated glucose levels due to steroids.  Stable for the most part.  Continue to monitor.  HbA1c 6.4.   Mild transaminitis Due to COVID-19.  Chronic kidney disease stage IIIa Renal function is stable.  Continue to monitor urine output.  Avoid nephrotoxic agents.  Essential hypertension Continue metoprolol.  Should be able to resume lisinopril at discharge.     Microcytic anemia No evidence for any overt bleeding. Outpatient evaluation.  Hemoglobin remains stable.  History of seizure disorder Continue Keppra.  History of CVA Continue statin  History of obstructive sleep apnea Uses CPAP at home. Cannot use in the hospital due to Covid.  Morbid obesity Estimated body mass index is 57.08 kg/m as calculated from the following:   Height as of this encounter: 4\' 11"  (1.499 m).   Weight as of this encounter: 128.2 kg.   DVT Prophylaxis: Lovenox Code Status: Full code Family Communication: Daughter being updated daily. Disposition Plan: Hopefully return home when improved.  Involve PT and OT as the patient is really not mobilizing much by herself.  Status is: Inpatient  Remains inpatient appropriate because:IV treatments appropriate due to intensity of illness or inability to take PO and Inpatient level of care appropriate due to severity of illness   Dispo:  Patient From: Home  Planned Disposition: Home  Expected discharge date: 11/15/19  Medically stable for discharge: No     Medications:  Scheduled:  vitamin C  500 mg Oral Daily   atorvastatin  20 mg Oral Daily   [START ON 11/15/2019] baricitinib  4 mg Oral Daily   insulin aspart  0-15 Units Subcutaneous TID WC   insulin aspart  0-5 Units Subcutaneous QHS   insulin aspart  6 Units Subcutaneous TID WC   Ipratropium-Albuterol  1 puff Inhalation Q6H    levETIRAcetam  750 mg Oral BID   methylPREDNISolone (SOLU-MEDROL) injection  0.5 mg/kg Intravenous Q12H   metoprolol tartrate  100 mg Oral BID   pantoprazole  40 mg Oral QHS   zinc sulfate  220 mg Oral Daily   Continuous:  YBW:LSLHTDSKAJGOT, albuterol, chlorpheniramine-HYDROcodone, guaiFENesin-dextromethorphan, ondansetron **OR** ondansetron (ZOFRAN) IV, polyvinyl alcohol, sodium chloride, traMADol, traZODone   Objective:  Vital Signs  Vitals:   11/13/19 1412 11/13/19 2055 11/14/19 0600 11/14/19 0900  BP: 125/63 (!) 145/69 (!) 199/81   Pulse: 67 68 68   Resp: 20 20 20    Temp: 97.8 F (36.6 C) 98.2 F (36.8 C) 98.2 F (36.8 C)   TempSrc: Oral Oral Oral   SpO2: 94% 92% 94% 91%  Weight:      Height:        Intake/Output Summary (Last 24 hours) at 11/14/2019 1213 Last data filed at 11/13/2019 1500  Gross per 24 hour  Intake 236 ml  Output --  Net 236 ml   Filed Weights   11/13/19 1311  Weight: 128.2 kg    General appearance: Awake alert.  In no distress.  Morbidly obese Resp: Normal effort at rest.  Coarse breath sounds with few crackles at the bases.  No wheezing or rhonchi.   Cardio: S1-S2 is normal regular.  No S3-S4.  No rubs murmurs or bruit GI: Abdomen is soft.  Nontender nondistended.  Bowel sounds are present normal.  No masses organomegaly Rectal exam as described under assessment plan. Extremities: No edema.   Neurologic: Alert and oriented x3.  No focal neurological deficits.     Lab Results:  Data Reviewed: I have personally reviewed following labs and imaging studies  CBC: Recent Labs  Lab 11/10/19 1251 11/11/19 0500 11/12/19 0311 11/13/19 0423 11/14/19 0333  WBC 6.8 3.9* 3.8* 5.0 4.8  NEUTROABS 5.3 2.3 2.0 3.5 3.1  HGB 10.7* 11.0* 11.2* 10.9* 12.0  HCT 36.9 38.6 39.4 37.3 42.1  MCV 77.8* 78.6* 79.8* 78.5* 78.7*  PLT 203 227 176 271 935    Basic Metabolic Panel: Recent Labs  Lab 11/09/19 1337 11/09/19 1337 11/10/19 1251  11/11/19 0500 11/12/19 0311 11/13/19 0423 11/14/19 0333  NA 135  --   --  137 139 140 142  K 3.5  --   --  4.3 5.0 4.3 4.0  CL 99  --   --  101 100 102 103  CO2 26  --   --  28 27 30 28   GLUCOSE 128*  --   --  165* 180* 189* 190*  BUN 16  --   --  29* 44* 44* 36*  CREATININE 1.20*   < > 1.20* 1.18* 1.29* 1.02* 0.90  CALCIUM 7.8*  --   --  8.6* 9.2 9.1 9.0   < > = values in this interval not displayed.    GFR: Estimated Creatinine Clearance: 83 mL/min (by C-G formula based on SCr of 0.9 mg/dL).  Liver Function Tests: Recent Labs  Lab 11/09/19 1337 11/11/19 0500 11/12/19 0311 11/13/19 0423 11/14/19 0333  AST 43* 55* 41 27 26  ALT 24 31 26 25 27   ALKPHOS 44 40 38 43 45  BILITOT 0.5 0.4 0.6 0.3 0.5  PROT 8.3* 8.4* 7.7 7.6 7.9  ALBUMIN 3.4* 3.1* 3.0* 3.0* 3.1*    Recent Labs  Lab 11/09/19 1337  LIPASE 27    HbA1C: No results for input(s): HGBA1C in the last 72 hours.  CBG: Recent Labs  Lab 11/13/19 1127 11/13/19 1712 11/13/19 2048 11/14/19 0808 11/14/19 1151  GLUCAP 228* 217* 212* 163* 168*    Lipid Profile: No results for input(s): CHOL, HDL, LDLCALC, TRIG, CHOLHDL, LDLDIRECT in the last 72 hours.   Anemia Panel: No results for input(s): VITAMINB12, FOLATE, FERRITIN, TIBC, IRON, RETICCTPCT in the last 72 hours.  Recent Results (from the past 240 hour(s))  SARS Coronavirus 2 by RT PCR (hospital order, performed in [facility] hospital lab) Nasopharyngeal Nasopharyngeal Swab     Status: Abnormal   Collection Time: 11/10/19  6:13 AM   Specimen: Nasopharyngeal Swab  Result Value Ref Range Status   SARS Coronavirus 2 POSITIVE (A) NEGATIVE Final    Comment: RESULT CALLED TO, READ BACK BY AND VERIFIED WITH: GARRISON,G. RN @0748  ON 8.18.2021 BY NMCCOY (NOTE) SARS-CoV-2 target nucleic acids are DETECTED  SARS-CoV-2 RNA is generally detectable in upper respiratory specimens  during the acute phase of  infection.  Positive results are indicative  of the  presence of the identified virus, but do not rule out bacterial infection or co-infection with other pathogens not detected by the test.  Clinical correlation with patient history and  other diagnostic information is necessary to determine patient infection status.  The expected result is negative.  Fact Sheet for Patients:   StrictlyIdeas.no   Fact Sheet for Healthcare Providers:   BankingDealers.co.za    This test is not yet approved or cleared by the [place] FDA and  has been authorized for detection and/or diagnosis of SARS-CoV-2 by FDA under an Emergency Use Authorization (EUA).  This EUA will remain in effect (meanin g this test can be used) for the duration of  the COVID-19 declaration under Section 564(b)(1) of the Act, 21 U.S.C. section 360-bbb-3(b)(1), unless the authorization is terminated or revoked sooner.  Performed at [facility], [place] [address], [place], [place] [address]   Blood Culture (routine x 2)     Status: None (Preliminary result)   Collection Time: 11/10/19 12:51 PM   Specimen: BLOOD  Result Value Ref Range Status   Specimen Description   Final    BLOOD LEFT ANTECUBITAL Performed at [facility] [address] [place], [place] [address]    Special Requests   Final    BOTTLES DRAWN AEROBIC AND ANAEROBIC Blood Culture adequate volume Performed at [facility] [address], [place], [place] [address]    Culture   Final    NO GROWTH 4 DAYS Performed at [facility], [place] [address] [place], [place] [address]    Report Status PENDING  Incomplete      Radiology Studies: No results found.     LOS: 4 days   Ashyla Luth [employer] on [email or web address]  11/14/2019, 12:13 PM

## 2019-11-14 NOTE — Evaluation (Signed)
Physical Therapy Evaluation Patient Details Name: [name] MRN: [id number] DOB: Aug 23, 1960 Today's Date: 11/14/2019   History of Present Illness  59 y.o. female with history of HTN, DM-2, CVA, seizure and morbid obesity presented with complains of nausea vomiting and diarrhea.  tested positive for Covid 19, admitted with ARF d/t covid  Clinical Impression  Pt admitted with above diagnosis.    Pt currently with functional limitations due to the deficits listed below (see PT Problem List).  amb in room distance, pt dizzy initially therefore did not attempt to wean or remove O2 today. Pt SpO2=90-94% on 3L.  Reviewed correct posture and breathing with pt, she will need reinforcement. Instructed and had pt return demo IS x10 breaths.  Will continue to follow in acute setting.  Pt will benefit from skilled PT to increase their independence and safety with mobility to allow discharge to the venue listed below.       Follow Up Recommendations No PT follow up;Supervision - Intermittent    Equipment Recommendations      Recommendations for Other Services       Precautions / Restrictions Precautions Precautions: Fall Restrictions Weight Bearing Restrictions: No      Mobility  Bed Mobility               General bed mobility comments: NT--in chair on arrival  Transfers Overall transfer level: Needs assistance Equipment used: None Transfers: Sit to/from Stand Sit to Stand: Min guard         General transfer comment: cues for hand placment. dizzy on standing and required seated rest. No dinamap in room to check BP  Ambulation/Gait Ambulation/Gait assistance: Min assist Gait Distance (Feet): 20 Feet (in room distance) Assistive device:  (simulated RW with O2 tank holder) Gait Pattern/deviations: Step-through pattern;Wide base of support     General Gait Details: cues for posture, breathing; SpO2= 90-94% on 3L  Stairs            Wheelchair Mobility     Modified Rankin (Stroke Patients Only)       Balance Overall balance assessment: Needs assistance Sitting-balance support: Feet supported;No upper extremity supported Sitting balance-Leahy Scale: Fair       Standing balance-Leahy Scale: Fair Standing balance comment: reliant on UEs for dynamic tasks                             Pertinent Vitals/Pain Pain Assessment: No/denies pain    Home Living Family/patient expects to be discharged to:: Private residence Living Arrangements: Spouse/significant other Available Help at Discharge: Available 24 hours/day;Family Type of Home: House Home Access: Stairs to enter Entrance Stairs-Rails: Right;Left;Can reach both Entrance Stairs-Number of Steps: 4 Home Layout: One level Home Equipment: Shower seat;Cane - single point, RW (unsure if it is sized for pt)      Prior Function Level of Independence: Independent               Hand Dominance        Extremity/Trunk Assessment   Upper Extremity Assessment Upper Extremity Assessment: Overall WFL for tasks assessed    Lower Extremity Assessment Lower Extremity Assessment: Overall WFL for tasks assessed       Communication   Communication: No difficulties  Cognition Arousal/Alertness: Awake/alert Behavior During Therapy: WFL for tasks assessed/performed Overall Cognitive Status: Impaired/Different from baseline (very sleepy) Area of Impairment: Following commands;Problem solving  Following Commands: Follows one step commands with increased time;Follows multi-step commands with increased time     Problem Solving: Slow processing;Difficulty sequencing;Requires verbal cues;Requires tactile cues General Comments: generally in fog, oriented however slow to process commands as well as to answer questions regarding home situation.  fallling asleep easily      General Comments      Exercises     Assessment/Plan    PT  Assessment Patient needs continued PT services  PT Problem List Decreased activity tolerance;Decreased mobility;Pain;Decreased knowledge of use of DME;Decreased safety awareness;Cardiopulmonary status limiting activity;Decreased balance       PT Treatment Interventions DME instruction;Therapeutic exercise;Gait training;Functional mobility training;Therapeutic activities;Patient/family education    PT Goals (Current goals can be found in the Care Plan section)  Acute Rehab PT Goals Patient Stated Goal: home, feel better PT Goal Formulation: With patient Time For Goal Achievement: 11/28/19 Potential to Achieve Goals: Good    Frequency Min 3X/week   Barriers to discharge        Co-evaluation               AM-PAC PT "6 Clicks" Mobility  Outcome Measure Help needed turning from your back to your side while in a flat bed without using bedrails?: A Little Help needed moving from lying on your back to sitting on the side of a flat bed without using bedrails?: A Little Help needed moving to and from a bed to a chair (including a wheelchair)?: A Little Help needed standing up from a chair using your arms (e.g., wheelchair or bedside chair)?: A Little Help needed to walk in hospital room?: A Little Help needed climbing 3-5 steps with a railing? : A Lot 6 Click Score: 17    End of Session Equipment Utilized During Treatment: Gait belt Activity Tolerance: Patient tolerated treatment well Patient left: in chair;with call bell/phone within reach   PT Visit Diagnosis: Other abnormalities of gait and mobility (R26.89)    Time: 9798-9211 PT Time Calculation (min) (ACUTE ONLY): 24 min   Charges:   PT Evaluation $PT Eval Low Complexity: 1 Low PT Treatments $Gait Training: 8-22 mins        Baxter Flattery, PT  Acute Rehab Dept ([facility]) [phone] Pager [phone]  11/14/2019   [facility] 11/14/2019, 4:08 PM

## 2019-11-14 NOTE — Progress Notes (Addendum)
Patient having hematuria this morning per this RN's assessment. When patient questioned regarding the urine, patient stated that she thought the hematuria was from the sores on her bottom due to moisture.This RN notified by nurse tech that patient was having hematuria overnight as well. Dr. [place] [name] notified.

## 2019-11-14 NOTE — Progress Notes (Signed)
IV removed due to occlusion. IV dressing moderately saturated with sanguinous drainage upon assessment. Tip intact upon removal. MD aware.

## 2019-11-15 LAB — CBC WITH DIFFERENTIAL/PLATELET
Abs Immature Granulocytes: 0.15 10*3/uL — ABNORMAL HIGH (ref 0.00–0.07)
Basophils Absolute: 0 10*3/uL (ref 0.0–0.1)
Basophils Relative: 1 %
Eosinophils Absolute: 0 10*3/uL (ref 0.0–0.5)
Eosinophils Relative: 0 %
HCT: 41.7 % (ref 36.0–46.0)
Hemoglobin: 12 g/dL (ref 12.0–15.0)
Immature Granulocytes: 3 %
Lymphocytes Relative: 20 %
Lymphs Abs: 0.9 10*3/uL (ref 0.7–4.0)
MCH: 22.3 pg — ABNORMAL LOW (ref 26.0–34.0)
MCHC: 28.8 g/dL — ABNORMAL LOW (ref 30.0–36.0)
MCV: 77.4 fL — ABNORMAL LOW (ref 80.0–100.0)
Monocytes Absolute: 0.3 10*3/uL (ref 0.1–1.0)
Monocytes Relative: 6 %
Neutro Abs: 3.1 10*3/uL (ref 1.7–7.7)
Neutrophils Relative %: 70 %
Platelets: 354 10*3/uL (ref 150–400)
RBC: 5.39 MIL/uL — ABNORMAL HIGH (ref 3.87–5.11)
RDW: 16 % — ABNORMAL HIGH (ref 11.5–15.5)
WBC: 4.5 10*3/uL (ref 4.0–10.5)
nRBC: 0.4 % — ABNORMAL HIGH (ref 0.0–0.2)

## 2019-11-15 LAB — COMPREHENSIVE METABOLIC PANEL
ALT: 34 U/L (ref 0–44)
AST: 34 U/L (ref 15–41)
Albumin: 3.4 g/dL — ABNORMAL LOW (ref 3.5–5.0)
Alkaline Phosphatase: 45 U/L (ref 38–126)
Anion gap: 14 (ref 5–15)
BUN: 34 mg/dL — ABNORMAL HIGH (ref 6–20)
CO2: 33 mmol/L — ABNORMAL HIGH (ref 22–32)
Calcium: 9.1 mg/dL (ref 8.9–10.3)
Chloride: 96 mmol/L — ABNORMAL LOW (ref 98–111)
Creatinine, Ser: 0.96 mg/dL (ref 0.44–1.00)
GFR calc Af Amer: >60 mL/min (ref >60)
GFR calc non Af Amer: >60 mL/min (ref >60)
Glucose, Bld: 225 mg/dL — ABNORMAL HIGH (ref 70–99)
Potassium: 3.9 mmol/L (ref 3.5–5.1)
Sodium: 143 mmol/L (ref 135–145)
Total Bilirubin: 0.9 mg/dL (ref 0.3–1.2)
Total Protein: 8.4 g/dL — ABNORMAL HIGH (ref 6.5–8.1)

## 2019-11-15 LAB — CULTURE, BLOOD (ROUTINE X 2)
Culture: NO GROWTH
Special Requests: ADEQUATE

## 2019-11-15 LAB — GLUCOSE, CAPILLARY
Glucose-Capillary: 193 mg/dL — ABNORMAL HIGH (ref 70–99)
Glucose-Capillary: 171 mg/dL — ABNORMAL HIGH (ref 70–99)
Glucose-Capillary: 236 mg/dL — ABNORMAL HIGH (ref 70–99)
Glucose-Capillary: 256 mg/dL — ABNORMAL HIGH (ref 70–99)

## 2019-11-15 MED ORDER — LIP MEDEX EX OINT
1.0000 "application " | TOPICAL_OINTMENT | CUTANEOUS | Status: DC | PRN
  Administered 2019-11-15: 1 via TOPICAL
  Filled 2019-11-15: qty 7

## 2019-11-15 MED ORDER — PREDNISONE 20 MG PO TABS
40.0000 mg | ORAL_TABLET | Freq: Every day | ORAL | Status: DC
  Administered 2019-11-16: 40 mg via ORAL
  Filled 2019-11-15: qty 2

## 2019-11-15 MED ORDER — LISINOPRIL 10 MG PO TABS
10.0000 mg | ORAL_TABLET | Freq: Every day | ORAL | Status: DC
  Administered 2019-11-15 – 2019-11-16 (×2): 10 mg via ORAL
  Filled 2019-11-15 (×2): qty 1

## 2019-11-15 NOTE — Care Management Important Message (Signed)
Important Message  Patient Details IM Letter given to the Patient Name: DARCHELLE NUNES MRN: [id number] Date of Birth: December 05, 1960   Medicare Important Message Given:  Yes     Kerin Salen 11/15/2019, 9:58 AM

## 2019-11-15 NOTE — Progress Notes (Signed)
PROGRESS NOTE  [name] YDX:[id number] DOB: July 04, 1960 DOA: 11/10/2019  PCP: Charlott Rakes, MD  Brief History/Interval Summary: 59 y.o. female with history of HTN, DM-2, CVA, seizure and morbid obesity presented with complains of nausea vomiting and diarrhea.  She also mentioned history of cough and shortness of breath.  She has not been vaccinated against COVID-19.  She was evaluated in the emergency department.  She was noted to be hypoxic.  Chest x-ray showed congestion.  Patient indeed tested positive for COVID-19.  She was hospitalized for further management.    Reason for Visit: Acute respiratory failure with hypoxia  Consultants: None  Procedures: None  Antibiotics: Anti-infectives (From admission, onward)   Start     Dose/Rate Route Frequency Ordered Stop   11/11/19 1000  remdesivir 100 mg in sodium chloride 0.9 % 100 mL IVPB       "Followed by" Linked Group Details   100 mg 200 mL/hr over 30 Minutes Intravenous Daily 11/10/19 0844 11/14/19 1200   11/10/19 1000  remdesivir 200 mg in sodium chloride 0.9% 250 mL IVPB       "Followed by" Linked Group Details   200 mg 580 mL/hr over 30 Minutes Intravenous Once 11/10/19 0844 11/10/19 1007      Subjective/Interval History: Patient mentions that she is feeling well.  Her shortness of breath has improved.  Denies any nausea vomiting.  Did have another bowel movement this morning with some blood in the stool.  Denies any abdominal pain.    Assessment/Plan:  Acute Hypoxic Resp. Failure/Pneumonia due to COVID-19   Recent Labs  Lab 11/09/19 1337 11/10/19 1251 11/11/19 0500 11/12/19 0311 11/13/19 0423 11/14/19 0333 11/15/19 0522  DDIMER  --  0.98* 0.82* 0.55* <0.27 0.32  --   FERRITIN  --  315* 475*  --   --   --   --   CRP  --  12.6* 12.6* 7.3* 3.9* 2.4*  --   ALT   < >  --  31 26 25 27  34  PROCALCITON  --  <0.10  --   --   --   --   --    < > = values in this interval not displayed.    Objective  findings: Oxygen requirements: Nasal cannula 2 to 3 L saturating in the late 90s.    COVID 19 Therapeutics: Antibacterials: None.  Procalcitonin was less than 0.1 Remdesivir: Completed course on 8/22 Steroids: Solu-Medrol twice a day Diuretics: Not on scheduled diuretics Actemra/Baricitinib: On baricitinib PUD Prophylaxis: On PPI DVT Prophylaxis:  Lovenox   From a respiratory standpoint patient appears to be stabilizing.  Should be able to be down her oxygen.  May still need some oxygen at home.  Mobilize.  She has completed course of Remdesivir.  She remains on steroids.  Will change to oral today.  Remains on baricitinib.  Inflammatory markers have improved.  D-dimer was normal.  The treatment plan and use of medications and known side effects were discussed with patient. Some of the medications used are based on case reports/anecdotal data.  All other medications being used in the management of COVID-19 based on limited study data.  Complete risks and long-term side effects are unknown, however in the best clinical judgment they seem to be of some benefit.  Patient wanted to proceed with treatment options provided.  Hematochezia likely secondary to internal hemorrhoids Patient was noted to have some blood in the stool yesterday and this morning.  Discussed with her  daughter who mention she does notice blood in the stools every so often.  She underwent colonoscopy in January of this year which showed internal hemorrhoids.  Polyp was noted.  Hemoglobin has been stable.  Anusol has been prescribed for the hemorrhoids.  Patient reassured.  No abdominal pain.  Abdomen is benign.  If hemoglobin remains stable over the next 24 hours she could be discharged and follow-up with her gastroenterologist.    Hematuria, ruled out UA does not show any hemoglobin.  The blood noted in the urine was most likely from her hemorrhoids.    Nausea vomiting and diarrhea This is most likely due to COVID-19.  Seems  to have improved.  Diabetes mellitus type 2, uncontrolled with hyperglycemia It appears that she takes her Metformin and glipizide at home.  Elevated glucose levels due to steroids.  Stable for the most part.  Continue to monitor.  HbA1c 6.4.   Mild transaminitis Due to COVID-19.  Chronic kidney disease stage IIIa Renal function is stable.  Continue to monitor urine output.  Avoid nephrotoxic agents.  Essential hypertension Blood pressure noted to be poorly controlled.  Remains on metoprolol.  We will resume her lisinopril as well.  Steroids likely contributing.  Microcytic anemia No evidence for any overt bleeding.  Hemoglobin remained stable  History of seizure disorder Continue Keppra.  History of CVA Continue statin  History of obstructive sleep apnea Uses CPAP at home. Cannot use in the hospital due to Covid.  Morbid obesity Estimated body mass index is 57.08 kg/m as calculated from the following:   Height as of this encounter: 4\' 11"  (1.499 m).   Weight as of this encounter: 128.2 kg.   DVT Prophylaxis: Lovenox Code Status: Full code Family Communication: Daughter being updated daily. Disposition Plan: Hopefully return home when improved.  PT and OT evaluation.  Home oxygen assessment.  Status is: Inpatient  Remains inpatient appropriate because:IV treatments appropriate due to intensity of illness or inability to take PO and Inpatient level of care appropriate due to severity of illness   Dispo:  Patient From: Home  Planned Disposition: Home  Expected discharge date: 11/16/19  Medically stable for discharge: No     Medications:  Scheduled: . vitamin C  500 mg Oral Daily  . atorvastatin  20 mg Oral Daily  . baricitinib  4 mg Oral Daily  . hydrocortisone  25 mg Rectal BID  . insulin aspart  0-15 Units Subcutaneous TID WC  . insulin aspart  0-5 Units Subcutaneous QHS  . insulin aspart  6 Units Subcutaneous TID WC  . Ipratropium-Albuterol  1 puff  Inhalation Q6H  . levETIRAcetam  750 mg Oral BID  . methylPREDNISolone (SOLU-MEDROL) injection  0.5 mg/kg Intravenous Q12H  . metoprolol tartrate  100 mg Oral BID  . pantoprazole  40 mg Oral QHS  . zinc sulfate  220 mg Oral Daily   Continuous:  WYO:VZCHYIFOYDXAJ, albuterol, chlorpheniramine-HYDROcodone, guaiFENesin-dextromethorphan, lip balm, ondansetron **OR** ondansetron (ZOFRAN) IV, polyvinyl alcohol, sodium chloride, traMADol, traZODone   Objective:  Vital Signs  Vitals:   11/14/19 2049 11/15/19 0035 11/15/19 0512 11/15/19 0530  BP: (!) 156/88  (!) 193/128 (!) 170/79  Pulse: 89 66 72   Resp: 19     Temp: 97.7 F (36.5 C)  98.2 F (36.8 C)   TempSrc:   Oral   SpO2: 92% 94% 98%   Weight:      Height:        Intake/Output Summary (Last 24 hours) at  11/15/2019 1229 Last data filed at 11/15/2019 0817 Gross per 24 hour  Intake 180 ml  Output 400 ml  Net -220 ml   Filed Weights   11/13/19 1311  Weight: 128.2 kg    General appearance: Awake alert.  In no distress Resp: Normal effort at rest.  Good air entry bilaterally.  Few crackles at the bases.  No wheezing or rhonchi. Cardio: S1-S2 is normal regular.  No S3-S4.  No rubs murmurs or bruit GI: Abdomen is soft.  Nontender nondistended.  Bowel sounds are present normal.  No masses organomegaly Extremities: No edema.  Full range of motion of lower extremities. Neurologic: Alert and oriented x3.  No focal neurological deficits.     Lab Results:  Data Reviewed: I have personally reviewed following labs and imaging studies  CBC: Recent Labs  Lab 11/11/19 0500 11/11/19 0500 11/12/19 0311 11/13/19 0423 11/14/19 0333 11/14/19 1303 11/15/19 0522  WBC 3.9*   < > 3.8* 5.0 4.8 11.3* 4.5  NEUTROABS 2.3  --  2.0 3.5 3.1  --  3.1  HGB 11.0*   < > 11.2* 10.9* 12.0 12.1 12.0  HCT 38.6   < > 39.4 37.3 42.1 41.7 41.7  MCV 78.6*   < > 79.8* 78.5* 78.7* 78.2* 77.4*  PLT 227   < > 176 271 304 399 354   < > = values in this  interval not displayed.    Basic Metabolic Panel: Recent Labs  Lab 11/11/19 0500 11/12/19 0311 11/13/19 0423 11/14/19 0333 11/15/19 0522  NA 137 139 140 142 143  K 4.3 5.0 4.3 4.0 3.9  CL 101 100 102 103 96*  CO2 28 27 30 28  33*  GLUCOSE 165* 180* 189* 190* 225*  BUN 29* 44* 44* 36* 34*  CREATININE 1.18* 1.29* 1.02* 0.90 0.96  CALCIUM 8.6* 9.2 9.1 9.0 9.1    GFR: Estimated Creatinine Clearance: 77.8 mL/min (by C-G formula based on SCr of 0.96 mg/dL).  Liver Function Tests: Recent Labs  Lab 11/11/19 0500 11/12/19 0311 11/13/19 0423 11/14/19 0333 11/15/19 0522  AST 55* 41 27 26 34  ALT 31 26 25 27  34  ALKPHOS 40 38 43 45 45  BILITOT 0.4 0.6 0.3 0.5 0.9  PROT 8.4* 7.7 7.6 7.9 8.4*  ALBUMIN 3.1* 3.0* 3.0* 3.1* 3.4*    Recent Labs  Lab 11/09/19 1337  LIPASE 27   CBG: Recent Labs  Lab 11/14/19 1151 11/14/19 1644 11/14/19 2052 11/15/19 0732 11/15/19 1134  GLUCAP 168* 176* 187* 193* 171*     Recent Results (from the past 240 hour(s))  SARS Coronavirus 2 by RT PCR (hospital order, performed in [place] hospital lab) Nasopharyngeal Nasopharyngeal Swab     Status: Abnormal   Collection Time: 11/10/19  6:13 AM   Specimen: Nasopharyngeal Swab  Result Value Ref Range Status   SARS Coronavirus 2 POSITIVE (A) NEGATIVE Final    Comment: RESULT CALLED TO, READ BACK BY AND VERIFIED WITH: GARRISON,G. RN @0748  ON 8.18.2021 BY NMCCOY (NOTE) SARS-CoV-2 target nucleic acids are DETECTED  SARS-CoV-2 RNA is generally detectable in upper respiratory specimens  during the acute phase of infection.  Positive results are indicative  of the presence of the identified virus, but do not rule out bacterial infection or co-infection with other pathogens not detected by the test.  Clinical correlation with patient history and  other diagnostic information is necessary to determine patient infection status.  The expected result is negative.  Fact Sheet for Patients:  StrictlyIdeas.no   Fact Sheet for Healthcare Providers:   BankingDealers.co.za    This test is not yet approved or cleared by the [place] FDA and  has been authorized for detection and/or diagnosis of SARS-CoV-2 by FDA under an Emergency Use Authorization (EUA).  This EUA will remain in effect (meanin g this test can be used) for the duration of  the COVID-19 declaration under Section 564(b)(1) of the Act, 21 U.S.C. section 360-bbb-3(b)(1), unless the authorization is terminated or revoked sooner.  Performed at [facility], [place] [address] [place], [place] [address]   Blood Culture (routine x 2)     Status: None   Collection Time: 11/10/19 12:51 PM   Specimen: BLOOD  Result Value Ref Range Status   Specimen Description   Final    BLOOD LEFT ANTECUBITAL Performed at [facility] [address], [place], [place] [address]    Special Requests   Final    BOTTLES DRAWN AEROBIC AND ANAEROBIC Blood Culture adequate volume Performed at [facility] [address], [place], [place] [address]    Culture   Final    NO GROWTH 5 DAYS Performed at [facility], [place] [address], [place],  [address]    Report Status 11/15/2019 FINAL  Final      Radiology Studies: No results found.     LOS: 5 days   Reverie Vaquera [employer] on [email or web address]  11/15/2019, 12:29 PM

## 2019-11-16 LAB — CBC
HCT: 41.8 % (ref 36.0–46.0)
Hemoglobin: 12.1 g/dL (ref 12.0–15.0)
MCH: 22.4 pg — ABNORMAL LOW (ref 26.0–34.0)
MCHC: 28.9 g/dL — ABNORMAL LOW (ref 30.0–36.0)
MCV: 77.4 fL — ABNORMAL LOW (ref 80.0–100.0)
Platelets: 406 10*3/uL — ABNORMAL HIGH (ref 150–400)
RBC: 5.4 MIL/uL — ABNORMAL HIGH (ref 3.87–5.11)
RDW: 16.4 % — ABNORMAL HIGH (ref 11.5–15.5)
WBC: 6 10*3/uL (ref 4.0–10.5)
nRBC: 0.3 % — ABNORMAL HIGH (ref 0.0–0.2)

## 2019-11-16 LAB — BASIC METABOLIC PANEL
Anion gap: 12 (ref 5–15)
BUN: 41 mg/dL — ABNORMAL HIGH (ref 6–20)
CO2: 30 mmol/L (ref 22–32)
Calcium: 8.9 mg/dL (ref 8.9–10.3)
Chloride: 95 mmol/L — ABNORMAL LOW (ref 98–111)
Creatinine, Ser: 1.06 mg/dL — ABNORMAL HIGH (ref 0.44–1.00)
GFR calc Af Amer: >60 mL/min (ref >60)
GFR calc non Af Amer: 58 mL/min — ABNORMAL LOW (ref >60)
Glucose, Bld: 158 mg/dL — ABNORMAL HIGH (ref 70–99)
Potassium: 3.8 mmol/L (ref 3.5–5.1)
Sodium: 137 mmol/L (ref 135–145)

## 2019-11-16 LAB — GLUCOSE, CAPILLARY
Glucose-Capillary: 148 mg/dL — ABNORMAL HIGH (ref 70–99)
Glucose-Capillary: 194 mg/dL — ABNORMAL HIGH (ref 70–99)

## 2019-11-16 MED ORDER — ASCORBIC ACID 500 MG PO TABS: 500.0000 mg | ORAL_TABLET | Freq: Every day | ORAL | 0 refills | Status: AC

## 2019-11-16 MED ORDER — HYDROCORTISONE ACETATE 25 MG RE SUPP
25.0000 mg | Freq: Two times a day (BID) | RECTAL | 0 refills | Status: DC
Start: 2019-11-16 — End: 2021-01-10

## 2019-11-16 MED ORDER — PANTOPRAZOLE SODIUM 40 MG PO TBEC: 40.0000 mg | DELAYED_RELEASE_TABLET | Freq: Every day | ORAL | 0 refills | Status: DC

## 2019-11-16 MED ORDER — PREDNISONE 20 MG PO TABS: ORAL_TABLET | ORAL | 0 refills | Status: DC

## 2019-11-16 MED ORDER — ZINC SULFATE 220 (50 ZN) MG PO CAPS: 220.0000 mg | ORAL_CAPSULE | Freq: Every day | ORAL | 0 refills | Status: AC

## 2019-11-16 NOTE — Progress Notes (Signed)
SATURATION QUALIFICATIONS: (This note is used to comply with regulatory documentation for home oxygen)  Patient Saturations on Room Air at Rest = 93%  Patient Saturations on Room Air while Ambulating = 86%  Patient Saturations on 2 Liters of oxygen while Ambulating = 94%  Please briefly explain why patient needs home oxygen: Saturations not adequate on Room Air for discharge. Will need Home 02 at 2Liters.

## 2019-11-16 NOTE — Discharge Instructions (Signed)
COVID-19 COVID-19 is a respiratory infection that is caused by a virus called severe acute respiratory syndrome coronavirus 2 (SARS-CoV-2). The disease is also known as coronavirus disease or novel coronavirus. In some people, the virus may not cause any symptoms. In others, it may cause a serious infection. The infection can get worse quickly and can lead to complications, such as:  Pneumonia, or infection of the lungs.  Acute respiratory distress syndrome or ARDS. This is a condition in which fluid build-up in the lungs prevents the lungs from filling with air and passing oxygen into the blood.  Acute respiratory failure. This is a condition in which there is not enough oxygen passing from the lungs to the body or when carbon dioxide is not passing from the lungs out of the body.  Sepsis or septic shock. This is a serious bodily reaction to an infection.  Blood clotting problems.  Secondary infections due to bacteria or fungus.  Organ failure. This is when your body's organs stop working. The virus that causes COVID-19 is contagious. This means that it can spread from person to person through droplets from coughs and sneezes (respiratory secretions). What are the causes? This illness is caused by a virus. You may catch the virus by:  Breathing in droplets from an infected person. Droplets can be spread by a person breathing, speaking, singing, coughing, or sneezing.  Touching something, like a table or a doorknob, that was exposed to the virus (contaminated) and then touching your mouth, nose, or eyes. What increases the risk? Risk for infection You are more likely to be infected with this virus if you:  Are within 6 feet (2 meters) of a person with COVID-19.  Provide care for or live with a person who is infected with COVID-19.  Spend time in crowded indoor spaces or live in shared housing. Risk for serious illness You are more likely to become seriously ill from the virus if you:   Are 50 years of age or older. The higher your age, the more you are at risk for serious illness.  Live in a nursing home or long-term care facility.  Have cancer.  Have a long-term (chronic) disease such as: ? Chronic lung disease, including chronic obstructive pulmonary disease or asthma. ? A long-term disease that lowers your body's ability to fight infection (immunocompromised). ? Heart disease, including heart failure, a condition in which the arteries that lead to the heart become narrow or blocked (coronary artery disease), a disease which makes the heart muscle thick, weak, or stiff (cardiomyopathy). ? Diabetes. ? Chronic kidney disease. ? Sickle cell disease, a condition in which red blood cells have an abnormal "sickle" shape. ? Liver disease.  Are obese. What are the signs or symptoms? Symptoms of this condition can range from mild to severe. Symptoms may appear any time from 2 to 14 days after being exposed to the virus. They include:  A fever or chills.  A cough.  Difficulty breathing.  Headaches, body aches, or muscle aches.  Runny or stuffy (congested) nose.  A sore throat.  New loss of taste or smell. Some people may also have stomach problems, such as nausea, vomiting, or diarrhea. Other people may not have any symptoms of COVID-19. How is this diagnosed? This condition may be diagnosed based on:  Your signs and symptoms, especially if: ? You live in an area with a COVID-19 outbreak. ? You recently traveled to or from an area where the virus is common. ? You   provide care for or live with a person who was diagnosed with COVID-19. ? You were exposed to a person who was diagnosed with COVID-19.  A physical exam.  Lab tests, which may include: ? Taking a sample of fluid from the back of your nose and throat (nasopharyngeal fluid), your nose, or your throat using a swab. ? A sample of mucus from your lungs (sputum). ? Blood tests.  Imaging tests, which  may include, X-rays, CT scan, or ultrasound. How is this treated? At present, there is no medicine to treat COVID-19. Medicines that treat other diseases are being used on a trial basis to see if they are effective against COVID-19. Your health care provider will talk with you about ways to treat your symptoms. For most people, the infection is mild and can be managed at home with rest, fluids, and over-the-counter medicines. Treatment for a serious infection usually takes places in a hospital intensive care unit (ICU). It may include one or more of the following treatments. These treatments are given until your symptoms improve.  Receiving fluids and medicines through an IV.  Supplemental oxygen. Extra oxygen is given through a tube in the nose, a face mask, or a hood.  Positioning you to lie on your stomach (prone position). This makes it easier for oxygen to get into the lungs.  Continuous positive airway pressure (CPAP) or bi-level positive airway pressure (BPAP) machine. This treatment uses mild air pressure to keep the airways open. A tube that is connected to a motor delivers oxygen to the body.  Ventilator. This treatment moves air into and out of the lungs by using a tube that is placed in your windpipe.  Tracheostomy. This is a procedure to create a hole in the neck so that a breathing tube can be inserted.  Extracorporeal membrane oxygenation (ECMO). This procedure gives the lungs a chance to recover by taking over the functions of the heart and lungs. It supplies oxygen to the body and removes carbon dioxide. Follow these instructions at home: Lifestyle  If you are sick, stay home except to get medical care. Your health care provider will tell you how long to stay home. Call your health care provider before you go for medical care.  Rest at home as told by your health care provider.  Do not use any products that contain nicotine or tobacco, such as cigarettes, e-cigarettes, and  chewing tobacco. If you need help quitting, ask your health care provider.  Return to your normal activities as told by your health care provider. Ask your health care provider what activities are safe for you. General instructions  Take over-the-counter and prescription medicines only as told by your health care provider.  Drink enough fluid to keep your urine pale yellow.  Keep all follow-up visits as told by your health care provider. This is important. How is this prevented?  There is no vaccine to help prevent COVID-19 infection. However, there are steps you can take to protect yourself and others from this virus. To protect yourself:   Do not travel to areas where COVID-19 is a risk. The areas where COVID-19 is reported change often. To identify high-risk areas and travel restrictions, check the CDC travel website: wwwnc.cdc.gov/travel/notices  If you live in, or must travel to, an area where COVID-19 is a risk, take precautions to avoid infection. ? Stay away from people who are sick. ? Wash your hands often with soap and water for 20 seconds. If soap and water   are not available, use an alcohol-based hand sanitizer. ? Avoid touching your mouth, face, eyes, or nose. ? Avoid going out in public, follow guidance from your state and local health authorities. ? If you must go out in public, wear a cloth face covering or face mask. Make sure your mask covers your nose and mouth. ? Avoid crowded indoor spaces. Stay at least 6 feet (2 meters) away from others. ? Disinfect objects and surfaces that are frequently touched every day. This may include:  Counters and tables.  Doorknobs and light switches.  Sinks and faucets.  Electronics, such as phones, remote controls, keyboards, computers, and tablets. To protect others: If you have symptoms of COVID-19, take steps to prevent the virus from spreading to others.  If you think you have a COVID-19 infection, contact your health care  provider right away. Tell your health care team that you think you may have a COVID-19 infection.  Stay home. Leave your house only to seek medical care. Do not use public transport.  Do not travel while you are sick.  Wash your hands often with soap and water for 20 seconds. If soap and water are not available, use alcohol-based hand sanitizer.  Stay away from other members of your household. Let healthy household members care for children and pets, if possible. If you have to care for children or pets, wash your hands often and wear a mask. If possible, stay in your own room, separate from others. Use a different bathroom.  Make sure that all people in your household wash their hands well and often.  Cough or sneeze into a tissue or your sleeve or elbow. Do not cough or sneeze into your hand or into the air.  Wear a cloth face covering or face mask. Make sure your mask covers your nose and mouth. Where to find more information  Centers for Disease Control and Prevention: www.cdc.gov/coronavirus/2019-ncov/index.html  World Health Organization: www.who.int/health-topics/coronavirus Contact a health care provider if:  You live in or have traveled to an area where COVID-19 is a risk and you have symptoms of the infection.  You have had contact with someone who has COVID-19 and you have symptoms of the infection. Get help right away if:  You have trouble breathing.  You have pain or pressure in your chest.  You have confusion.  You have bluish lips and fingernails.  You have difficulty waking from sleep.  You have symptoms that get worse. These symptoms may represent a serious problem that is an emergency. Do not wait to see if the symptoms will go away. Get medical help right away. Call your local emergency services (911 in the [place] Do not drive yourself to the hospital. Let the emergency medical personnel know if you think you have COVID-19. Summary  COVID-19 is a  respiratory infection that is caused by a virus. It is also known as coronavirus disease or novel coronavirus. It can cause serious infections, such as pneumonia, acute respiratory distress syndrome, acute respiratory failure, or sepsis.  The virus that causes COVID-19 is contagious. This means that it can spread from person to person through droplets from breathing, speaking, singing, coughing, or sneezing.  You are more likely to develop a serious illness if you are 50 years of age or older, have a weak immune system, live in a nursing home, or have chronic disease.  There is no medicine to treat COVID-19. Your health care provider will talk with you about ways to treat your symptoms.    Take steps to protect yourself and others from infection. Wash your hands often and disinfect objects and surfaces that are frequently touched every day. Stay away from people who are sick and wear a mask if you are sick. This information is not intended to replace advice given to you by your health care provider. Make sure you discuss any questions you have with your health care provider. Document Revised: 01/08/2019 Document Reviewed: 04/16/2018 Elsevier Patient Education  2020 [employer]  

## 2019-11-16 NOTE — Discharge Summary (Addendum)
[facility]  Physician Discharge Summary   Patient ID: [name] MRN: [id number] DOB/AGE: 1960/11/12 59 y.o.  Admit date: 11/10/2019 Discharge date: 11/16/2019  PCP: Charlott Rakes, MD  DISCHARGE DIAGNOSES:  Acute Hypoxic Resp. Failure Pneumonia due to COVID-19 Hematochezia likely secondary to internal hemorrhoids Diabetes mellitus type 2, uncontrolled with hyperglycemia Mild transaminitis Chronic kidney disease stage IIIa Essential hypertension History of seizure disorder History of obstructive sleep apnea  RECOMMENDATIONS FOR OUTPATIENT FOLLOW UP: 1. Home oxygen ordered   Home Health:None  Equipment/Devices:Home O2   CODE STATUS:Full Code   DISCHARGE CONDITION: fair  Diet recommendation: As before  INITIAL HISTORY: 59 y.o.femalewith history ofHTN, DM-2, CVA, seizure and morbid obesity presented with complains of nausea vomiting and diarrhea.  She also mentioned history of cough and shortness of breath.  She has not been vaccinated against COVID-19.  She was evaluated in the emergency department.  She was noted to be hypoxic.  Chest x-ray showed congestion.  Patient indeed tested positive for COVID-19.  She was hospitalized for further management.     HOSPITAL COURSE:   Acute Hypoxic Resp. Failure/Pneumonia due to COVID-19/sepsis ruled out Patient was admitted and started on steroids and remdesivir. Due to morbid obesity and high risk for decompensation she was also started on baricitinib after discussions regarding risks, benefits and adverse effects. Patient started improving slowly. She has completed course of remdesivir. Steroid taper. Desaturates with exertion. Home oxygen will be ordered .   Last Labs            Recent Labs  Lab 11/09/19 1337 11/10/19 1251 11/11/19 0500 11/12/19 0311 11/13/19 0423 11/14/19 0333 11/15/19 0522  DDIMER  --  0.98* 0.82* 0.55* <0.27 0.32  --   FERRITIN  --  315* 475*  --   --   --   --   CRP  --  12.6*  12.6* 7.3* 3.9* 2.4*  --   ALT   < >  --  '31 26 25 27 ' 34  PROCALCITON  --  <0.10  --   --   --   --   --    < > = values in this interval not displayed.      Hematochezia likely secondary to internal hemorrhoids Patient was noted to have blood in the stool.  Discussed with her daughter who mentioned that patient does notice blood in the stools every so often.  She underwent colonoscopy in January of this year which showed internal hemorrhoids.  Hemoglobin has been stable.  Anusol has been prescribed for the hemorrhoids.  Patient reassured.  Abdomen is benign. Bleeding has subsided. F/u with PCP.  Hematuria, ruled out UA does not show any hemoglobin.  The blood noted in the urine was most likely from her hemorrhoids.    Nausea vomiting and diarrhea This is most likely due to COVID-19.  Seems to have improved.  Diabetes mellitus type 2, uncontrolled with hyperglycemia Resume Metformin and glipizide at home.  .  HbA1c 6.4.   Mild transaminitis Due to COVID-19.  Chronic kidney disease stage IIIa Renal function is stable.    Essential hypertension Resume home meds.  Microcytic anemia No evidence for any overt bleeding.  Hemoglobin remains stable  History of seizure disorder Continue Keppra.  History of CVA Continue statin  History of obstructive sleep apnea Uses CPAP at home.   Morbid obesity Estimated body mass index is 57.08 kg/m as calculated from the following:   Height as of this encounter: '4\' 11"'  (1.499  m).   Weight as of this encounter: 128.2 kg.  Stable. [place] for discharge home today. Discussed with her daughter as well.  PERTINENT LABS:  The results of significant diagnostics from this hospitalization (including imaging, microbiology, ancillary and laboratory) are listed below for reference.    Microbiology: Recent Results (from the past 240 hour(s))  SARS Coronavirus 2 by RT PCR (hospital order, performed in [facility] hospital lab)  Nasopharyngeal Nasopharyngeal Swab     Status: Abnormal   Collection Time: 11/10/19  6:13 AM   Specimen: Nasopharyngeal Swab  Result Value Ref Range Status   SARS Coronavirus 2 POSITIVE (A) NEGATIVE Final    Comment: RESULT CALLED TO, READ BACK BY AND VERIFIED WITH: GARRISON,G. RN '@[id number]'  ON 8.18.2021 BY NMCCOY (NOTE) SARS-CoV-2 target nucleic acids are DETECTED  SARS-CoV-2 RNA is generally detectable in upper respiratory specimens  during the acute phase of infection.  Positive results are indicative  of the presence of the identified virus, but do not rule out bacterial infection or co-infection with other pathogens not detected by the test.  Clinical correlation with patient history and  other diagnostic information is necessary to determine patient infection status.  The expected result is negative.  Fact Sheet for Patients:   StrictlyIdeas.no   Fact Sheet for Healthcare Providers:   BankingDealers.co.za    This test is not yet approved or cleared by the [place] FDA and  has been authorized for detection and/or diagnosis of SARS-CoV-2 by FDA under an Emergency Use Authorization (EUA).  This EUA will remain in effect (meanin g this test can be used) for the duration of  the COVID-19 declaration under Section 564(b)(1) of the Act, 21 U.S.C. section 360-bbb-3(b)(1), unless the authorization is terminated or revoked sooner.  Performed at [facility], [place] [address], [place], [place] [address]   Blood Culture (routine x 2)     Status: None   Collection Time: 11/10/19 12:51 PM   Specimen: BLOOD  Result Value Ref Range Status   Specimen Description   Final    BLOOD LEFT ANTECUBITAL Performed at [facility] [address], [place], [place] [address]    Special Requests   Final    BOTTLES DRAWN AEROBIC AND ANAEROBIC Blood Culture adequate volume Performed at [facility] [address] [place], [place] [address]    Culture   Final    NO GROWTH 5 DAYS Performed at [facility], [place] [address], [place], [place] [address]    Report Status 11/15/2019 FINAL  Final     Labs:  COVID-19 Labs  Recent Labs    11/14/19 0333  DDIMER 0.32  CRP 2.4*    Lab Results  Component Value Date   SARSCOV2NAA POSITIVE (A) 11/10/2019   SARSCOV2NAA RESULT:  NEGATIVE 04/02/2019      Basic Metabolic Panel: Recent Labs  Lab 11/12/19 0311 11/13/19 0423 11/14/19 0333 11/15/19 0522 11/16/19 0415  NA 139 140 142 143 137  K 5.0 4.3 4.0 3.9 3.8  CL 100 102 103 96* 95*  CO2 '27 30 28 ' 33* 30  GLUCOSE 180* 189* 190* 225* 158*  BUN 44* 44* 36* 34* 41*  CREATININE 1.29* 1.02* 0.90 0.96 1.06*  CALCIUM 9.2 9.1 9.0 9.1 8.9   Liver Function Tests: Recent Labs  Lab 11/11/19 0500 11/12/19 0311 11/13/19 0423 11/14/19 0333 11/15/19 0522  AST 55* 41 27 26 34  ALT '31 26 25 27 ' 34  ALKPHOS 40 38  43 45 45  BILITOT 0.4 0.6 0.3 0.5 0.9  PROT 8.4* 7.7 7.6 7.9 8.4*  ALBUMIN 3.1* 3.0* 3.0* 3.1* 3.4*   CBC: Recent Labs  Lab 11/11/19 0500 11/11/19 0500 11/12/19 0311 11/12/19 0311 11/13/19 0423 11/14/19 0333 11/14/19 1303 11/15/19 0522 11/16/19 0415  WBC 3.9*   < > 3.8*   < > 5.0 4.8 11.3* 4.5 6.0  NEUTROABS 2.3  --  2.0  --  3.5 3.1  --  3.1  --   HGB 11.0*   < > 11.2*   < > 10.9* 12.0 12.1 12.0 12.1  HCT 38.6   < > 39.4   < > 37.3 42.1 41.7 41.7 41.8  MCV 78.6*   < > 79.8*   < > 78.5* 78.7* 78.2* 77.4* 77.4*  PLT 227   < > 176   < > 271 304 399 354 406*   < > = values in this interval not displayed.   BNP: BNP (last 3 results) Recent Labs    11/10/19 0613  BNP 29.2    CBG: Recent Labs  Lab 11/15/19 1134 11/15/19 1635 11/15/19 1958 11/16/19 0744 11/16/19 1137  GLUCAP 171* 236* 256* 148* 194*     IMAGING STUDIES DG Chest 2 View  Result Date: 11/09/2019 CLINICAL DATA:  Cough, vomiting, diarrhea and chills for 4 days. EXAM:  CHEST - 2 VIEW COMPARISON:  PA and lateral chest 01/21/2018. FINDINGS: There is cardiomegaly and vascular congestion. No consolidative process, pneumothorax or pleural effusion. No acute or focal bony abnormality. Thoracic spondylosis noted. IMPRESSION: Cardiomegaly and pulmonary vascular congestion. Electronically Signed   By: Inge Rise M.D.   On: 11/09/2019 14:52    DISCHARGE EXAMINATION: Vitals:   11/15/19 1636 11/15/19 1957 11/16/19 0130 11/16/19 0435  BP: (!) 136/96 133/79  (!) 141/64  Pulse: 74 77  (!) 51  Resp: '20 17  20  ' Temp: 98 F (36.7 C) 98.1 F (36.7 C)  97.9 F (36.6 C)  TempSrc: Oral     SpO2: 93% 95% 94% 94%  Weight:      Height:       General appearance: alert, cooperative, appears stated age and no distress Resp: improved aeration. few crackles at bases. no wheezing or rhonchi Cardio: regular rate and rhythm, S1, S2 normal, no murmur, click, rub or gallop GI: soft, non-tender; bowel sounds normal; no masses,  no organomegaly  DISPOSITION: Home  Discharge Instructions    Call MD for:  difficulty breathing, headache or visual disturbances   Complete by: As directed    Call MD for:  extreme fatigue   Complete by: As directed    Call MD for:  persistant dizziness or light-headedness   Complete by: As directed    Call MD for:  persistant nausea and vomiting   Complete by: As directed    Call MD for:  severe uncontrolled pain   Complete by: As directed    Call MD for:  temperature >100.4   Complete by: As directed    Diet Carb Modified   Complete by: As directed    Discharge instructions   Complete by: As directed    Please seek attention immediately if your rectal bleeding were to get worse.  Follow-up with your PCP for same.   COVID 19 INSTRUCTIONS  - You are felt to be stable enough to no longer require inpatient monitoring, testing, and treatment, though you will need to follow the recommendations below: - Based on the CDC's non-test criteria  for  ending self-isolation: You may not return to work/leave the home until at least 20 days since symptom onset AND 24 hours without a fever (without taking tylenol, ibuprofen, etc.) AND have improvement in respiratory symptoms. - Do not take NSAID medications (including, but not limited to, ibuprofen, advil, motrin, naproxen, aleve, goody's powder, etc.) - Follow up with your doctor in the next week via telehealth or seek medical attention right away if your symptoms get WORSE.    Directions for you at home:  Wear a facemask You should wear a facemask that covers your nose and mouth when you are in the same room with other people and when you visit a healthcare provider. People who live with or visit you should also wear a facemask while they are in the same room with you.  Separate yourself from other people in your home As much as possible, you should stay in a different room from other people in your home. Also, you should use a separate bathroom, if available.  Avoid sharing household items You should not share dishes, drinking glasses, cups, eating utensils, towels, bedding, or other items with other people in your home. After using these items, you should wash them thoroughly with soap and water.  Cover your coughs and sneezes Cover your mouth and nose with a tissue when you cough or sneeze, or you can cough or sneeze into your sleeve. Throw used tissues in a lined trash can, and immediately wash your hands with soap and water for at least 20 seconds or use an alcohol-based hand rub.  Wash your [facility] your hands often and thoroughly with soap and water for at least 20 seconds. You can use an alcohol-based hand sanitizer if soap and water are not available and if your hands are not visibly dirty. Avoid touching your eyes, nose, and mouth with unwashed hands.  Directions for those who live with, or provide care at home for you:  Limit the number of people who have contact with  the patient If possible, have only one caregiver for the patient. Other household members should stay in another home or place of residence. If this is not possible, they should stay in another room, or be separated from the patient as much as possible. Use a separate bathroom, if available. Restrict visitors who do not have an essential need to be in the home.  Ensure good ventilation Make sure that shared spaces in the home have good air flow, such as from an air conditioner or an opened window, weather permitting.  Wash your hands often Wash your hands often and thoroughly with soap and water for at least 20 seconds. You can use an alcohol based hand sanitizer if soap and water are not available and if your hands are not visibly dirty. Avoid touching your eyes, nose, and mouth with unwashed hands. Use disposable paper towels to dry your hands. If not available, use dedicated cloth towels and replace them when they become wet.  Wear a facemask and gloves Wear a disposable facemask at all times in the room and gloves when you touch or have contact with the patient's blood, body fluids, and/or secretions or excretions, such as sweat, saliva, sputum, nasal mucus, vomit, urine, or feces.  Ensure the mask fits over your nose and mouth tightly, and do not touch it during use. Throw out disposable facemasks and gloves after using them. Do not reuse. Wash your hands immediately after removing your facemask and gloves. If your  personal clothing becomes contaminated, carefully remove clothing and launder. Wash your hands after handling contaminated clothing. Place all used disposable facemasks, gloves, and other waste in a lined container before disposing them with other household waste. Remove gloves and wash your hands immediately after handling these items.  Do not share dishes, glasses, or other household items with the patient Avoid sharing household items. You should not share dishes, drinking  glasses, cups, eating utensils, towels, bedding, or other items with a patient who is confirmed to have, or being evaluated for, COVID-19 infection. After the person uses these items, you should wash them thoroughly with soap and water.  Wash laundry thoroughly Immediately remove and wash clothes or bedding that have blood, body fluids, and/or secretions or excretions, such as sweat, saliva, sputum, nasal mucus, vomit, urine, or feces, on them. Wear gloves when handling laundry from the patient. Read and follow directions on labels of laundry or clothing items and detergent. In general, wash and dry with the warmest temperatures recommended on the label.  Clean all areas the individual has used often Clean all touchable surfaces, such as counters, tabletops, doorknobs, bathroom fixtures, toilets, phones, keyboards, tablets, and bedside tables, every day. Also, clean any surfaces that may have blood, body fluids, and/or secretions or excretions on them. Wear gloves when cleaning surfaces the patient has come in contact with. Use a diluted bleach solution (e.g., dilute bleach with 1 part bleach and 10 parts water) or a household disinfectant with a label that says EPA-registered for coronaviruses. To make a bleach solution at home, add 1 tablespoon of bleach to 1 quart (4 cups) of water. For a larger supply, add  cup of bleach to 1 gallon (16 cups) of water. Read labels of cleaning products and follow recommendations provided on product labels. Labels contain instructions for safe and effective use of the cleaning product including precautions you should take when applying the product, such as wearing gloves or eye protection and making sure you have good ventilation during use of the product. Remove gloves and wash hands immediately after cleaning.  Monitor yourself for signs and symptoms of illness Caregivers and household members are considered close contacts, should monitor their health, and will  be asked to limit movement outside of the home to the extent possible. Follow the monitoring steps for close contacts listed on the symptom monitoring form.   If you have additional questions, contact your local health department or call the epidemiologist on call at [phone] (available 24/7). This guidance is subject to change. For the most up-to-date guidance from [facility], please refer to their website: YouBlogs.pl   You were cared for by a hospitalist during your hospital stay. If you have any questions about your discharge medications or the care you received while you were in the hospital after you are discharged, you can call the unit and asked to speak with the hospitalist on call if the hospitalist that took care of you is not available. Once you are discharged, your primary care physician will handle any further medical issues. Please note that NO REFILLS for any discharge medications will be authorized once you are discharged, as it is imperative that you return to your primary care physician (or establish a relationship with a primary care physician if you do not have one) for your aftercare needs so that they can reassess your need for medications and monitor your lab values. If you do not have a primary care physician, you can call [phone] for a physician  referral.   Increase activity slowly   Complete by: As directed    No wound care   Complete by: As directed         Allergies as of 11/16/2019      Reactions   Bee Venom Swelling   Facial swelling   Eggs Or Egg-derived Products Nausea And Vomiting, Swelling   Mouth swelled   Gabapentin Other (See Comments)   Jittery feeling, dyspnea, crawling sensation   Gadolinium Derivatives Itching   Immediately after gad injection pt. complained of tongue numbness and lower lip/ then had rt side of body tingling/ pt. Was assessed by Dr. Jeralyn Ruths before leaving/ no medications  were administered/      Medication List    STOP taking these medications   Na Sulfate-K Sulfate-Mg Sulf 17.5-3.13-1.6 GM/177ML Soln     TAKE these medications   Accu-Chek FastClix Lancets Misc Check blood sugar up to 3 times daily. E11.59   Accu-Chek Guide Me w/Device Kit 1 kit by Does not apply route daily. Check blood sugar up to 3 times daily. E11.59   Accu-Chek Guide test strip Generic drug: glucose blood Check blood sugar up to 3 times daily. E11.59   acetaminophen 500 MG tablet Commonly known as: TYLENOL Take 500-1,000 mg by mouth every 6 (six) hours as needed for mild pain or headache.   ascorbic acid 500 MG tablet Commonly known as: VITAMIN C Take 1 tablet (500 mg total) by mouth daily for 10 days. Start taking on: November 17, 2019   atorvastatin 20 MG tablet Commonly known as: LIPITOR Take 1 tablet (20 mg total) by mouth daily.   cetirizine 10 MG tablet Commonly known as: ZYRTEC Take 10 mg by mouth daily as needed for allergies.   diphenhydrAMINE 25 MG tablet Commonly known as: BENADRYL Take 25 mg by mouth at bedtime.   fluticasone 50 MCG/ACT nasal spray Commonly known as: FLONASE Place 1 spray into both nostrils daily as needed for allergies or rhinitis.   glipiZIDE 5 MG tablet Commonly known as: GLUCOTROL Take 0.5 tablets (2.5 mg total) by mouth 2 (two) times daily before a meal.   hydrocortisone 25 MG suppository Commonly known as: ANUSOL-HC Place 1 suppository (25 mg total) rectally 2 (two) times daily.   hydroxypropyl methylcellulose / hypromellose 2.5 % ophthalmic solution Commonly known as: ISOPTO TEARS / GONIOVISC Place 2 drops into both eyes as needed (irritation). What changed: when to take this   levETIRAcetam 500 MG tablet Commonly known as: KEPPRA TAKE 1 & 1/2 TABLETS BY MOUTH TWICE DAILY. What changed: See the new instructions.   lidocaine 5 % Commonly known as: Lidoderm Place 1 patch onto the skin daily. Remove & Discard patch  within 12 hours or as directed by MD What changed:   when to take this  reasons to take this   lisinopril 10 MG tablet Commonly known as: ZESTRIL Take 1 tablet (10 mg total) by mouth daily.   meclizine 25 MG tablet Commonly known as: ANTIVERT TAKE 1 TABLET (25 MG TOTAL) BY MOUTH 2 (TWO) TIMES DAILY AS NEEDED FOR DIZZINESS. MUST HAVE OFFICE VISIT FOR REFILLS What changed: additional instructions   metFORMIN 500 MG 24 hr tablet Commonly known as: Glucophage XR Take 2 tablets (1,000 mg total) by mouth 2 (two) times daily.   metoprolol tartrate 100 MG tablet Commonly known as: LOPRESSOR Take 1 tablet (100 mg total) by mouth 2 (two) times daily.   pantoprazole 40 MG tablet Commonly known as: PROTONIX Take  1 tablet (40 mg total) by mouth at bedtime for 10 days.   predniSONE 20 MG tablet Commonly known as: DELTASONE Take 3 tablets once daily for 3 days followed by 2 tablets once daily for 3 days followed by 1 tablet once daily for 3 days and then stop   tiZANidine 2 MG tablet Commonly known as: ZANAFLEX Take 1 tablet (2 mg total) by mouth every 8 (eight) hours as needed for muscle spasms.   Voltaren 1 % Gel Generic drug: diclofenac Sodium Apply 1 application topically 4 (four) times daily as needed (pain.). OTC   zinc sulfate 220 (50 Zn) MG capsule Take 1 capsule (220 mg total) by mouth daily for 10 days. Start taking on: November 17, 2019            Durable Medical Equipment  (From admission, onward)         Start     Ordered   11/16/19 0716  For home use only DME oxygen  Once       Question Answer Comment  Length of Need 6 Months   Mode or (Route) Nasal cannula   Liters per Minute 2   Frequency Continuous (stationary and portable oxygen unit needed)   Oxygen conserving device Yes   Oxygen delivery system Gas      11/16/19 0716            Follow-up Information    Charlott Rakes, MD. Schedule an appointment as soon as possible for a visit in 1 week(s).    Specialty: Family Medicine Contact information: [place] [address] [phone]               TOTAL DISCHARGE TIME: 70 mins  [place]  [facility] Pager on [email or web address]  11/16/2019, 2:14 PM

## 2019-11-16 NOTE — TOC Transition Note (Signed)
Transition of Care [facility]) - CM/SW Discharge Note   Patient Details  Name: [name] MRN: [id number] Date of Birth: 14-Apr-1960  [facility] CM/SW Contact:  Trish Mage, LCSW Phone Number: 11/16/2019, 1:01 PM   Clinical Narrative:  Patient to d/c home today.  SAT note by Catie and MD order for O2 seen and appreciated.  Contacted Zack with [employer] for delivery of O2 to patient's room today, and eventual delivery to home of larger unit. TOC sign off.     Final next level of care: Home/Self Care Barriers to Discharge: No Barriers Identified   Patient Goals and CMS Choice        Discharge Placement                       Discharge Plan and Services                                     Social Determinants of Health (SDOH) Interventions     Readmission Risk Interventions No flowsheet data found.

## 2019-11-17 ENCOUNTER — Telehealth: Payer: Self-pay

## 2019-11-17 MED FILL — PANTOPRAZOLE SOD DR 40 MG T: 40 | 10 days supply | Qty: 10 | Fill #0

## 2019-11-17 MED FILL — glipiZIDE 5 MG TABS: 5 | 30 days supply | Qty: 30 | Fill #2

## 2019-11-17 MED FILL — predniSONE 20 MG TABS: 20 | 9 days supply | Qty: 18 | Fill #0

## 2019-11-17 NOTE — Telephone Encounter (Signed)
Transition Care Management Follow-up Telephone Call  Date of discharge and from where: 11/16/2019, [facility]   How have you been since you were released from the hospital? She said she is just waiting for her meds.  She stated that the pharmacy did not have them ready yesterday when someone came to pick them up.  Instructed her to call this pharmacy this morning to check the status of the meds.   Any questions or concerns?  noted above regarding meds.   She said she is remaining quarantined at home and understands that she is to remain quarantined for 21 days from diagnosis.     Items Reviewed:  Did the pt receive and understand the discharge instructions provided? yes  Medications obtained and verified?  she said she has her old medications but does not have any of the new meds. She is waiting for the pharmacy to fill them. She did not have any questions about her med regime.   Any new allergies since your discharge?none reported   Do you have support at home?  yes. .  Has a cane  Has O2 from [employer]. Using it at 2L continuously.   Has glucometer  Functional Questionnaire: (I = Independent and D = Dependent) ADLs:independent  Follow up appointments reviewed:   PCP Hospital f/u appt confirmed? Dr Margarita Rana 11/30/2019 @ [facility] f/u appt confirmed? nutritionist 12/13/2019  Are transportation arrangements needed?  No.  She also said that she has applied for medicaid transportation. Instructed her to call [facility] as she may also have a benefit for transportation to medical appts.   If their condition worsens, is the pt aware to call PCP or go to the Emergency Dept.?  yes  Was the patient provided with contact information for the PCP's office or ED?  she has the phone number for the clinic  Was to pt encouraged to call back with questions or concerns?  yes

## 2019-11-17 NOTE — Telephone Encounter (Signed)
From discharge call. Patient has appt with Dr Margarita Rana 11/30/2019    She said she is just waiting for her meds.  She stated that the pharmacy did not have them ready yesterday when someone came to pick them up.  Instructed her to call this pharmacy this morning to check the status of the meds.    She said she is remaining quarantined at home and understands that she is to remain quarantined for 21 days from diagnosis.    Marland Kitchen  Has a cane   Has O2 from [employer]. Using it at 2L continuously.   No other concerns reported

## 2019-11-26 ENCOUNTER — Telehealth: Payer: Self-pay | Admitting: Family Medicine

## 2019-11-26 NOTE — Telephone Encounter (Signed)
Patient name and DOB verified. Encouraged patient to drink plenty of non sugary fluids- water and powerade or gatorade with no sugar. Encouraged rest and tylenol and warm showers. Patient has a virtual appt with Dr. Margarita Rana, 11/30/2019 at 10:50. Patient verbalized understanding.

## 2019-11-26 NOTE — Telephone Encounter (Signed)
Copied from [place] [id number]. Topic: General - Call Back - No Documentation >> Nov 26, 2019 11:43 AM Erick Blinks wrote: Reason for CRM: Pt has been experiencing muscle cramps and aches since leaving the hospital for the covid 19. Pt wants to know what she can do about this  Best contact: [phone]

## 2019-11-30 ENCOUNTER — Encounter: Payer: Self-pay | Admitting: Family Medicine

## 2019-11-30 ENCOUNTER — Other Ambulatory Visit: Payer: Self-pay | Admitting: Family Medicine

## 2019-11-30 ENCOUNTER — Ambulatory Visit: Payer: Medicare Other | Attending: Family Medicine | Admitting: Family Medicine

## 2019-11-30 ENCOUNTER — Other Ambulatory Visit: Payer: Self-pay

## 2019-11-30 DIAGNOSIS — I10 Essential (primary) hypertension: Secondary | ICD-10-CM | POA: Diagnosis not present

## 2019-11-30 DIAGNOSIS — E1159 Type 2 diabetes mellitus with other circulatory complications: Secondary | ICD-10-CM | POA: Diagnosis not present

## 2019-11-30 DIAGNOSIS — Z8616 Personal history of COVID-19: Secondary | ICD-10-CM | POA: Diagnosis not present

## 2019-11-30 DIAGNOSIS — I611 Nontraumatic intracerebral hemorrhage in hemisphere, cortical: Secondary | ICD-10-CM

## 2019-11-30 DIAGNOSIS — E785 Hyperlipidemia, unspecified: Secondary | ICD-10-CM

## 2019-11-30 MED ORDER — LISINOPRIL 10 MG PO TABS: 10.0000 mg | ORAL_TABLET | Freq: Every day | ORAL | 6 refills | Status: DC

## 2019-11-30 MED ORDER — METFORMIN HCL ER 500 MG PO TB24: 1000.0000 mg | ORAL_TABLET | Freq: Two times a day (BID) | ORAL | 6 refills | Status: DC

## 2019-11-30 MED ORDER — GLIPIZIDE 5 MG PO TABS: 2.5000 mg | ORAL_TABLET | Freq: Two times a day (BID) | ORAL | 6 refills | Status: DC

## 2019-11-30 MED ORDER — ATORVASTATIN CALCIUM 20 MG PO TABS: 20.0000 mg | ORAL_TABLET | Freq: Every day | ORAL | 6 refills | Status: DC

## 2019-11-30 MED ORDER — METOPROLOL TARTRATE 100 MG PO TABS: 100.0000 mg | ORAL_TABLET | Freq: Two times a day (BID) | ORAL | 6 refills | Status: DC

## 2019-11-30 MED FILL — ATORVASTATIN CALCIUM 20 MG: 20 | 30 days supply | Qty: 30 | Fill #0

## 2019-11-30 MED FILL — METOPROLOL TARTRATE 100 MG: 100 | 30 days supply | Qty: 60 | Fill #0

## 2019-11-30 MED FILL — LISINOPRIL 10 MG TABS: 10 | 30 days supply | Qty: 30 | Fill #0

## 2019-11-30 MED FILL — metFORMIN HCL ER 500 MG TB2: 500 | 30 days supply | Qty: 120 | Fill #0

## 2019-11-30 NOTE — Progress Notes (Signed)
HFU for covid infection.

## 2019-11-30 NOTE — Progress Notes (Signed)
Virtual Visit via Telephone Note  I connected with [name], on 11/30/2019 at 11:25 AM by telephone due to the COVID-19 pandemic and verified that I am speaking with the correct person using two identifiers.   Consent: I discussed the limitations, risks, security and privacy concerns of performing an evaluation and management service by telephone and the availability of in person appointments. I also discussed with the patient that there may be a patient responsible charge related to this service. The patient expressed understanding and agreed to proceed.   Location of Patient: Home  Location of Provider: Clinic   Persons participating in Telemedicine visit: Kieran Nachtigal [place]-CMA Dr. 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  Hospitalized 11/10/19- 11/16/19 for COVID-19 pneumonia treated with remdesivir, steroids and Baricitinib. Labs had revealed transaminitis, elevated D-dimer, ferritin in therapy. She improved over the course of her stay and was subsequently discharged on oxygen.  Every once in a while she has to use her Oxygen at 2L and sometimes she feels weak. She has no cough, chest pain, some myalgia and muscle aches but no fever Sometimes her knees feel wobbly and this has been since her hospitalization. With regards to her diabetes mellitus she is compliant with her medications and compliant with her antihypertensive. She has no additional concerns today and does have her husband at home to help her. She has questions about when she can receive the COVID-19 vaccine.   Past Medical History:  Diagnosis Date  . Headache   . Hypertension   . Iron deficiency anemia   . Obesity   .  Seizures ([place])   . Stroke ([place])   . Type 2 diabetes mellitus (HCC)    Allergies  Allergen Reactions  . Bee Venom Swelling    Facial swelling  . Eggs Or Egg-Derived Products Nausea And Vomiting and Swelling    Mouth swelled  . Gabapentin Other (See Comments)    Jittery feeling, dyspnea, crawling sensation  . Gadolinium Derivatives Itching    Immediately after gad injection pt. complained of tongue numbness and lower lip/ then had rt side of body tingling/ pt. Was assessed by Dr. Jeralyn Ruths before leaving/ no medications were administered/    Current Outpatient Medications on File Prior to Visit  Medication Sig Dispense Refill  . Accu-Chek FastClix Lancets MISC Check blood sugar up to 3 times daily. E11.59 102 each 6  . acetaminophen (TYLENOL) 500 MG tablet Take 500-1,000 mg by mouth every 6 (six) hours as needed for mild pain or headache.     Marland Kitchen atorvastatin (LIPITOR) 20 MG tablet Take 1 tablet (20 mg total) by mouth daily. 30 tablet 6  . Blood Glucose Monitoring Suppl (ACCU-CHEK GUIDE ME) w/Device KIT 1 kit by Does not apply route daily. Check blood sugar up to 3 times daily. E11.59 1 kit 0  . cetirizine (ZYRTEC) 10 MG tablet Take 10 mg by mouth daily as needed for allergies.     Marland Kitchen diclofenac Sodium (VOLTAREN) 1 % GEL Apply 1 application topically 4 (four) times daily as needed (pain.). OTC    . diphenhydrAMINE (BENADRYL) 25 MG tablet Take 25 mg by mouth at bedtime.     . fluticasone (FLONASE) 50 MCG/ACT nasal spray Place 1 spray into  both nostrils daily as needed for allergies or rhinitis. 16 g 1  . glipiZIDE (GLUCOTROL) 5 MG tablet Take 0.5 tablets (2.5 mg total) by mouth 2 (two) times daily before a meal. 30 tablet 6  . glucose blood (ACCU-CHEK GUIDE) test strip Check blood sugar up to 3 times daily. E11.59 100 each 6  . hydrocortisone (ANUSOL-HC) 25 MG suppository Place 1 suppository (25 mg total) rectally 2 (two) times daily. 12 suppository 0  . hydroxypropyl methylcellulose /  hypromellose (ISOPTO TEARS / GONIOVISC) 2.5 % ophthalmic solution Place 2 drops into both eyes as needed (irritation). (Patient taking differently: Place 2 drops into both eyes 4 (four) times daily as needed (irritation). ) 15 mL 1  . levETIRAcetam (KEPPRA) 500 MG tablet TAKE 1 & 1/2 TABLETS BY MOUTH TWICE DAILY. (Patient taking differently: Take 750 mg by mouth 2 (two) times daily. ) 90 tablet 8  . lidocaine (LIDODERM) 5 % Place 1 patch onto the skin daily. Remove & Discard patch within 12 hours or as directed by MD (Patient taking differently: Place 1 patch onto the skin daily as needed (pain). Remove & Discard patch within 12 hours or as directed by MD) 30 patch 0  . lisinopril (ZESTRIL) 10 MG tablet Take 1 tablet (10 mg total) by mouth daily. 30 tablet 6  . meclizine (ANTIVERT) 25 MG tablet TAKE 1 TABLET (25 MG TOTAL) BY MOUTH 2 (TWO) TIMES DAILY AS NEEDED FOR DIZZINESS. MUST HAVE OFFICE VISIT FOR REFILLS (Patient taking differently: Take 25 mg by mouth 2 (two) times daily as needed for dizziness. ) 60 tablet 0  . metFORMIN (GLUCOPHAGE XR) 500 MG 24 hr tablet Take 2 tablets (1,000 mg total) by mouth 2 (two) times daily. 120 tablet 6  . metoprolol tartrate (LOPRESSOR) 100 MG tablet Take 1 tablet (100 mg total) by mouth 2 (two) times daily. 60 tablet 6  . tiZANidine (ZANAFLEX) 2 MG tablet Take 1 tablet (2 mg total) by mouth every 8 (eight) hours as needed for muscle spasms. 90 tablet 0  . pantoprazole (PROTONIX) 40 MG tablet Take 1 tablet (40 mg total) by mouth at bedtime for 10 days. 10 tablet 0  . predniSONE (DELTASONE) 20 MG tablet Take 3 tablets once daily for 3 days followed by 2 tablets once daily for 3 days followed by 1 tablet once daily for 3 days and then stop (Patient not taking: Reported on 11/30/2019) 18 tablet 0   No current facility-administered medications on file prior to visit.    Observations/Objective: Awake, alert, oriented x3 Not in acute distress  Lab Results  Component  Value Date   HGBA1C 6.4 (H) 11/10/2019    Lipid Panel     Component Value Date/Time   CHOL 74 (L) 10/23/2018 0936   TRIG 63 11/10/2019 1251   HDL 36 (L) 10/23/2018 0936   CHOLHDL 2.1 10/23/2018 0936   CHOLHDL 2.7 10/08/2015 0541   VLDL 12 10/08/2015 0541   LDLCALC 24 10/23/2018 0936   LABVLDL 14 10/23/2018 0936    Assessment and Plan: 1. Type 2 diabetes mellitus with other circulatory complication, without long-term current use of insulin (HCC) Controlled with A1c of 6.4 Continue current regimen Counseled on Diabetic diet, my plate method, 670 minutes of moderate intensity exercise/week Blood sugar logs with fasting goals of 80-120 mg/dl, random of less than 180 and in the event of sugars less than 60 mg/dl or greater than 400 mg/dl encouraged to notify the clinic. Advised on the need for annual  eye exams, annual foot exams, Pneumonia vaccine. - glipiZIDE (GLUCOTROL) 5 MG tablet; Take 0.5 tablets (2.5 mg total) by mouth 2 (two) times daily before a meal.  Dispense: 30 tablet; Refill: 6 - metFORMIN (GLUCOPHAGE XR) 500 MG 24 hr tablet; Take 2 tablets (1,000 mg total) by mouth 2 (two) times daily.  Dispense: 120 tablet; Refill: 6  2. Nontraumatic cortical hemorrhage of cerebral hemisphere, unspecified laterality (HCC) Risk factor modification - atorvastatin (LIPITOR) 20 MG tablet; Take 1 tablet (20 mg total) by mouth daily.  Dispense: 30 tablet; Refill: 6  3. Hyperlipidemia, unspecified hyperlipidemia type Controlled Low-cholesterol diet - atorvastatin (LIPITOR) 20 MG tablet; Take 1 tablet (20 mg total) by mouth daily.  Dispense: 30 tablet; Refill: 6  4. Benign essential HTN Stable - lisinopril (ZESTRIL) 10 MG tablet; Take 1 tablet (10 mg total) by mouth daily.  Dispense: 30 tablet; Refill: 6 - metoprolol tartrate (LOPRESSOR) 100 MG tablet; Take 1 tablet (100 mg total) by mouth 2 (two) times daily.  Dispense: 60 tablet; Refill: 6  5. History of COVID-19 Improving Advised that  she might experience some long-haul symptoms and has been advised to notify the clinic if symptoms worsen Advised it is best to wait 90 days prior to receiving COVID-19 vaccination  Follow Up Instructions: 3 months for chronic disease management   I discussed the assessment and treatment plan with the patient. The patient was provided an opportunity to ask questions and all were answered. The patient agreed with the plan and demonstrated an understanding of the instructions.   The patient was advised to call back or seek an in-person evaluation if the symptoms worsen or if the condition fails to improve as anticipated.     I provided 18 minutes total of non-face-to-face time during this encounter including median intraservice time, reviewing previous notes, investigations, ordering medications, medical decision making, coordinating care and patient verbalized understanding at the end of the visit.     Charlott Rakes, MD, FAAFP. [facility] and [place], [place]   11/30/2019, 11:25 AM

## 2019-12-13 ENCOUNTER — Other Ambulatory Visit: Payer: Self-pay

## 2019-12-13 ENCOUNTER — Encounter: Payer: Medicare Other | Attending: Physical Medicine & Rehabilitation | Admitting: Registered"

## 2019-12-13 DIAGNOSIS — Z713 Dietary counseling and surveillance: Secondary | ICD-10-CM | POA: Diagnosis not present

## 2019-12-13 NOTE — Progress Notes (Signed)
Medical Nutrition Therapy:  Appt start time: 2010 end time:  1610.  Follow-up Assessment:  Primary concerns today: Weight managment.   Wt Readings from Last 3 Encounters:  12/13/19 Weight not taken - pt late for appt, standard scale is not working and not enough time for body comp scale.  11/13/19 282 lb 10.1 oz (128.2 kg)  11/08/19 289 lb 8 oz (131.3 kg)  06/24/19 294 lb (133.4 kg)   Patient states she unknowingly had covid at last nutrition visit and apologized. RD reviewed importance of the Ws and that minimal exposure can transmit virus.  Pt states she doesn't eat breakfast because she usually gets up sometime 9:30-11 am.   Physical activity. Pt states her physical activity is about the same except was less while sick.  Pt reports recently she has started having allergy symptoms when near her dog or her grandson's (hair?).  Pt states she is drinking juices, ginger ale. Pt reports she bought some soups but hasn't start eating yet. Pt states her husband is doing a lot of the cooking, grilling meat.  Due to T2DM diagnosis RD called MD office requesting latest A1c and if it is a concern to also send updated referral for diabetes education. Patient had pre-diabetes education 3 years ago, but from first visit 08/02/19 patient stated she was not aware of her diabetes diagnosis nor that 2 of her medications are for diabetes management.  Preferred Learning Style:   No preference indicated   Learning Readiness:  Contemplating  MEDICATIONS: reviewed    DIETARY INTAKE:  Usual eating pattern includes 2 meals and 2 snacks per day.  Everyday foods include potato chips.  Avoided foods include cheese because she has a hard time swallowing afterward  24-hr recall:  B ( AM):  Snk ( AM):  L (PM):chicken & broccoli alfredo Marie Calendar's, Tea with watermellon flavor Snk ( PM): soft peppermint D ( PM): fish, potato salad, fried zucchini Snk ( PM):  Beverages:  water, flavored water, green  tea, sweet tea reg soda.  Usual physical activity: limited ADLs  Estimated energy needs:not assessed  Progress Towards Goal(s):  In progress.   Nutritional Diagnosis:  NI-5.8.2 Excessive carbohydrate intake As related to regular soda intake.  As evidenced by dietary recall.    Intervention:  Nutrition Education topics: Sugar content of beverages Reading Labels  Plan:  Read nutrition facts labels on your green tea with watermellon  Limit sweetened beverages  Teaching Method Utilized:  Visual Auditory  Handouts given during visit include: Nutrition Facts Label.  Barriers to learning/adherence to lifestyle change: none  Demonstrated degree of understanding via:  Teach Back   Monitoring/Evaluation:  Dietary intake, exercise, energy, and body weight in 6 weels.

## 2019-12-17 DIAGNOSIS — U071 COVID-19: Secondary | ICD-10-CM | POA: Diagnosis not present

## 2019-12-17 DIAGNOSIS — R531 Weakness: Secondary | ICD-10-CM | POA: Diagnosis not present

## 2019-12-17 DIAGNOSIS — J9601 Acute respiratory failure with hypoxia: Secondary | ICD-10-CM | POA: Diagnosis not present

## 2019-12-20 ENCOUNTER — Encounter: Payer: Self-pay | Admitting: Registered"

## 2019-12-20 NOTE — Patient Instructions (Signed)
Plan:  Read nutrition facts labels on your green tea with watermellon  Limit sweetened beverages

## 2019-12-29 MED FILL — ATORVASTATIN CALCIUM 20 MG: 20 | 90 days supply | Qty: 90 | Fill #1

## 2019-12-29 MED FILL — metFORMIN HCL ER 500 MG TB2: 500 | 90 days supply | Qty: 360 | Fill #1

## 2019-12-29 MED FILL — LISINOPRIL 10 MG TABS: 10 | 90 days supply | Qty: 90 | Fill #1

## 2019-12-29 MED FILL — METOPROLOL TARTRATE 100 MG: 100 | 90 days supply | Qty: 180 | Fill #1

## 2020-01-18 MED FILL — glipiZIDE 5 MG TABS: 5 | 90 days supply | Qty: 90 | Fill #0

## 2020-01-24 DIAGNOSIS — U071 COVID-19: Secondary | ICD-10-CM | POA: Diagnosis not present

## 2020-01-24 DIAGNOSIS — J9601 Acute respiratory failure with hypoxia: Secondary | ICD-10-CM | POA: Diagnosis not present

## 2020-01-24 DIAGNOSIS — R531 Weakness: Secondary | ICD-10-CM | POA: Diagnosis not present

## 2020-01-26 ENCOUNTER — Encounter: Payer: Medicare Other | Attending: Physical Medicine & Rehabilitation | Admitting: Registered"

## 2020-02-09 ENCOUNTER — Other Ambulatory Visit: Payer: Self-pay | Admitting: Family Medicine

## 2020-02-09 DIAGNOSIS — R42 Dizziness and giddiness: Secondary | ICD-10-CM

## 2020-02-09 MED FILL — MECLIZINE 25 MG TABLET: 25 | 30 days supply | Qty: 60 | Fill #0

## 2020-02-09 NOTE — Telephone Encounter (Signed)
Requested medication (s) are due for refill today: Yes  Requested medication (s) are on the active medication list: Yes  Last refill:  10/13/19  Future visit scheduled: No  Notes to clinic:  See request.    Requested Prescriptions  Pending Prescriptions Disp Refills   meclizine (ANTIVERT) 25 MG tablet [Pharmacy Med Name: MECLIZINE 25 MG TABLET 25 Tablet] 60 tablet 0    Sig: TAKE 1 TABLET (25 MG TOTAL) BY MOUTH 2 (TWO) TIMES DAILY AS NEEDED FOR DIZZINESS. MUST HAVE OFFICE VISIT FOR REFILLS      Not Delegated - Gastroenterology: Antiemetics Failed - 02/09/2020  9:00 AM      Failed - This refill cannot be delegated      Passed - Valid encounter within last 6 months    Recent Outpatient Visits           2 months ago History of [place], [place], MD   11 months ago Type 2 diabetes mellitus with other circulatory complication, without long-term current use of insulin (HCC)   [place], [place], MD   1 year ago Type 2 diabetes mellitus without complication, without long-term current use of insulin ([place])   [place], [place], MD   1 year ago 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

## 2020-02-14 ENCOUNTER — Encounter: Payer: Medicare Other | Admitting: Registered"

## 2020-02-14 ENCOUNTER — Other Ambulatory Visit: Payer: Self-pay

## 2020-02-14 ENCOUNTER — Encounter: Payer: Self-pay | Admitting: Registered"

## 2020-02-14 NOTE — Patient Instructions (Addendum)
Contact your doctor about hypoglycemic symptoms accompanied with blood discharge form rectum. If drinking any juice or soda, make it diet Stop drinking soda, can do in baby steps Start drinking more water, experiment with infusing water for flavor Discontinue eating/drinking grapefruit due to potential interaction with statin

## 2020-02-14 NOTE — Progress Notes (Signed)
Medical Nutrition Therapy:  Appt start time: 4765 end time:  4650.  Follow-up Assessment:  Primary concerns today: Weight managment.   Wt Readings from Last 3 Encounters:  02/14/20 291 lb (132 kg)  11/13/19 282 lb 10.1 oz (128.2 kg)  11/08/19 289 lb 8 oz (131.3 kg)   Patient brought back the nutrition facts label with numbers written in from the snacks she eats while watching TV. Patient was watching calories and limiting her portion size, but did not notice sugar content of beverages.  Patient states her husband bought a lot of soda because it was on sale. Patient was surprised when it was pointed out how much sugar she is drinking. Patient believed that if she drinks it over a longer period of time it is not bad. Reviewed with patient the importance of cutting back sugar-sweetened beverages.  Medications:  Potential interaction of statin with grapefruit. Patient states she drinks grapefruit juice.  Supplement: Pt reports her insurance company told her they are going to send her Ginseng to reduce inflammation and aches. Pt reports her doctor wants to make sure it won't interact with her medications before she starts taking it.  Pt states she is afraid to check her blood sugar, she used to be able to do it occasionally but lately can't bring herself to prick herself with a needle.   Pt reports in last 2 weeks Hypoglycemia symptoms, hands slightly shaky and felt like she needed to eat something, accompanied by blood discharge from rectum.  Preferred Learning Style:   No preference indicated   Learning Readiness:  Contemplating  MEDICATIONS: reviewed    DIETARY INTAKE:  Usual eating pattern includes 2 meals and 2 snacks per day.  Everyday foods include potato chips.  Avoided foods include cheese because she has a hard time swallowing afterward  24-hr recall:  B ( AM):  Snk ( AM):  L (PM):baked chicken, onions & bbq sauce, yam (~30 g)  Snk ( PM): 1/2 c orange juice with  water D ( PM): bbq chicken wings, 1 cup green beans with butter, 1 cup seasoned rice Snk ( PM): pork skins, 2 pieces of candy Beverages:  water, green tea, orange juice, 16 oz soda  Usual physical activity: limited ADLs  Estimated energy needs:not assessed  Progress Towards Goal(s):  In progress.   Nutritional Diagnosis:  NI-5.8.2 Excessive carbohydrate intake As related to regular soda intake.  As evidenced by dietary recall.    Intervention:  Nutrition Education topics:  (repeated but stressed more emphatically the importance) Sugar content of beverages  Plan: Contact your doctor about hypoglycemic symptoms accompanied with blood discharge form rectum. If drinking any juice or soda, make it diet Stop drinking soda, can do in baby steps Start drinking more water, experiment with infusing water for flavor Discontinue eating/drinking grapefruit due to potential interaction with statin  Teaching Method Utilized:  Visual Auditory  Handouts given during visit include: none.  Barriers to learning/adherence to lifestyle change: home food environment    Demonstrated degree of understanding via:  Teach Back   Monitoring/Evaluation:  Dietary intake, exercise, energy, and body weight in 6 weels.

## 2020-02-15 ENCOUNTER — Other Ambulatory Visit: Payer: Self-pay | Admitting: Family Medicine

## 2020-02-15 DIAGNOSIS — E1159 Type 2 diabetes mellitus with other circulatory complications: Secondary | ICD-10-CM

## 2020-03-02 ENCOUNTER — Other Ambulatory Visit: Payer: Self-pay | Admitting: Physical Medicine & Rehabilitation

## 2020-03-03 ENCOUNTER — Other Ambulatory Visit: Payer: Self-pay

## 2020-03-08 NOTE — Telephone Encounter (Signed)
Patient aware of Dr. Serita Grit reply.

## 2020-03-09 ENCOUNTER — Encounter: Payer: Medicare Other | Attending: Physical Medicine & Rehabilitation | Admitting: Physical Medicine & Rehabilitation

## 2020-03-09 ENCOUNTER — Other Ambulatory Visit: Payer: Self-pay | Admitting: Physical Medicine & Rehabilitation

## 2020-03-09 ENCOUNTER — Encounter: Payer: Self-pay | Admitting: Physical Medicine & Rehabilitation

## 2020-03-09 ENCOUNTER — Other Ambulatory Visit: Payer: Self-pay

## 2020-03-09 VITALS — BP 179/108 | HR 98 | Temp 98.9°F | Ht 59.0 in | Wt 292.0 lb

## 2020-03-09 DIAGNOSIS — E236 Other disorders of pituitary gland: Secondary | ICD-10-CM | POA: Diagnosis not present

## 2020-03-09 DIAGNOSIS — M7541 Impingement syndrome of right shoulder: Secondary | ICD-10-CM | POA: Diagnosis not present

## 2020-03-09 DIAGNOSIS — M25511 Pain in right shoulder: Secondary | ICD-10-CM

## 2020-03-09 DIAGNOSIS — I611 Nontraumatic intracerebral hemorrhage in hemisphere, cortical: Secondary | ICD-10-CM | POA: Diagnosis not present

## 2020-03-09 DIAGNOSIS — G8929 Other chronic pain: Secondary | ICD-10-CM | POA: Diagnosis not present

## 2020-03-09 DIAGNOSIS — M19019 Primary osteoarthritis, unspecified shoulder: Secondary | ICD-10-CM | POA: Diagnosis not present

## 2020-03-09 MED ORDER — METHOCARBAMOL 750 MG PO TABS: 750.0000 mg | ORAL_TABLET | Freq: Two times a day (BID) | ORAL | 2 refills | Status: DC

## 2020-03-09 MED FILL — METHOCARBAMOL 750 MG TABS: 750 | 30 days supply | Qty: 60 | Fill #0

## 2020-03-09 NOTE — Progress Notes (Signed)
Subjective:    Patient ID: [name], female    DOB: [birth year], 59 y.o.   MRN: [id number]  HPI  RH-female with history of morbid obesity, HTN, iron deficiency anemia who presents for follow up after right temporal intracranial hemorrhage.   Last clinic visit 06/24/19. Since that time, pt states she states she did not follow up with [facility]. Denies seizures. She states she is working with nutritionist.  She states she is losing weight. She ran out of Robaxin. She had 3 falls, 2 because of her dog and the other her chair gave out. She is doing HEP. She did not obtain xray of her shoulder. Her BP is elevated, states it is because she was moving to come to the office.  States better controlled at home. She states she does not believe she followed up with Endo. She continues to have urinary freq - she has not followed up with PCP.  Pain Inventory Average Pain  7 Pain Right Now 8 My pain is intermittent, sharp, stabbing and aching  In the last 24 hours, has pain interfered with the following? General activity 6 Relation with others 6 Enjoyment of life 8 What TIME of day is your pain at its worst? morning and night Sleep (in general) Fair  Pain is worse with: walking, standing and some activites Pain improves with: rest and medication Relief from Meds: 4    Family History  Problem Relation Age of Onset  . Diabetes Father   . Hypertension Father   . Hypertension Sister   . Stroke Brother    Social History   Socioeconomic History  . Marital status: Married    Spouse name: Not on file  . Number of children: Not on file  . Years of education: Not on file  . Highest education level: Not on file  Occupational History  . Not on file  Tobacco Use  . Smoking status: Never Smoker  . Smokeless tobacco: Never Used  Vaping Use  . Vaping Use: Never used  Substance and Sexual Activity  . Alcohol use: No  . Drug use: No  . Sexual activity: Not Currently  Other Topics Concern  . Not  on file  Social History Narrative  . Not on file   Social Determinants of Health   Financial Resource Strain: Not on file  Food Insecurity: Not on file  Transportation Needs: Not on file  Physical Activity: Not on file  Stress: Not on file  Social Connections: Not on file   Past Surgical History:  Procedure Laterality Date  . CARDIAC CATHETERIZATION  04/2010   Dr. Terrence Dupont  . COLONOSCOPY WITH PROPOFOL N/A 04/06/2019   Procedure: COLONOSCOPY WITH PROPOFOL;  Surgeon: Mauri Pole, MD;  Location: [facility];  Service: Endoscopy;  Laterality: N/A;  . POLYPECTOMY  04/06/2019   Procedure: POLYPECTOMY;  Surgeon: Mauri Pole, MD;  Location: [facility];  Service: Endoscopy;;   Past Medical History:  Diagnosis Date  . Headache   . Hypertension   . Iron deficiency anemia   . Obesity   . Seizures ([place])   . Stroke ([place])   . Type 2 diabetes mellitus (HCC)    BP (!) 179/108   Pulse 98   Temp 98.9 F (37.2 C)   Ht 4\' 11"  (1.499 m)   Wt 292 lb (132.5 kg)   LMP 02/17/2011 (Exact Date)   SpO2 98%   BMI 58.98 kg/m   Opioid Risk Score:   Fall Risk Score:  `  1  Depression screen PHQ 2/9  Depression screen [facility] 2/9 08/02/2019 02/15/2019 10/20/2018 06/05/2018 04/22/2018 04/22/2018 10/31/2017  Decreased Interest 3 0 0 1 2 2 1   Down, Depressed, Hopeless 3 0 0 1 2 2 1   PHQ - 2 Score 6 0 0 2 4 4 2   Altered sleeping - 0 2 - 2 2 -  Tired, decreased energy - 0 0 - 2 2 -  Change in appetite - 0 2 - 2 2 -  Feeling bad or failure about yourself  - 0 0 - 2 2 -  Trouble concentrating - 0 0 - 0 0 -  Moving slowly or fidgety/restless - 0 2 - 2 2 -  Suicidal thoughts - 0 0 - 0 0 -  PHQ-9 Score - 0 6 - 14 14 -  Some recent data might be hidden    Review of Systems  Constitutional: Negative.        Bladder control problems   HENT: Negative.   Eyes: Negative.   Respiratory: Positive for apnea, cough, shortness of breath and wheezing.   Cardiovascular: Positive for leg swelling.   Gastrointestinal: Positive for diarrhea.  Endocrine: Negative.   Genitourinary: Positive for frequency.  Musculoskeletal: Positive for arthralgias, back pain and gait problem.       Spasms   Skin: Negative.   Neurological: Positive for weakness.       Tingling    Hematological: Negative.   Psychiatric/Behavioral: Positive for confusion, dysphoric mood and sleep disturbance.      Objective:   Physical Exam  Constitutional: No distress . Vital signs reviewed. Morbidly obese. HENT: Normocephalic.  Atraumatic. Eyes: EOMI. No discharge. Cardiovascular: No JVD.   Respiratory: Normal effort.  No stridor.   GI: Non-distended.   Skin: Warm and dry.  Intact. Psych: Normal mood.  Normal behavior. Musc: No edema in extremities.  No tenderness in extremities. Gait: Slow cadence +TTP right AC joint. Neurological: Alert Motor: RUE: 4+/5 throughout, some pain inhibition. RLE: 4/5 HF, 4/5 distally    Assessment & Plan:  RH-female with history of morbid obesity, HTN, iron deficiency anemia who presents for follow up after right temporal intracranial hemorrhage.  1.S/p right temporal intracranial hemorrhage with dysesthesias  Side effects with gabapentin             Aquatic therapy denied by insurance, encouraged follow up at [facility] x6, pt states she does not like the water.  Now states ambulating with daughter  MRI previously reviewed - stable infarct with empty sella persistent  Continue meds  Stopped taking Cymbalta, will not restart at present  2. Depression     Reaction to Prozaac  Pt states she is managing with her chaplain and scriptures  Stopped taking Cymbalta  3. Seizures             Continue meds, believes she is taking, but not sure  4. Morbid obesity  Continue follow up with dietitian             Continue to encourage weight loss again.  Patient to follow up with PCP bariatric surgery  5. Muscle spasms             Encouraged ROM and stretching  Continue  Robaxin 750 BID PRN  6. Abnormality of gait - post stroke             Continue cane  Therapies completed, cont HEP  Discussed environmental modifications  7. Right shoulder pain/AC - Rotator cuff tendinopathy  Xrays reviewed with some degenerative change right shoulder  Will consider steroid injection in future, she still does not want at present  Encouraged Voltaren gel  Now pain appears to be more in right [facility] joint - ordered xray and consider injection if necessary, reminded  8. Right leg pain  See #5, #7  9. Falls  Environmental factors, encouraged modifications.  Encouraged pt to push bed against wall or place furniture against bed to limit chances of rolling onto floor  See #6  10. 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 Urinary frequency  Mainly nocturnal  Encouraged decrease in nighttime intake and increase daytime fluid intake, reminded again  Patient to follow up with PCP  Patient would like to follow up in 3 months

## 2020-03-12 ENCOUNTER — Emergency Department (HOSPITAL_COMMUNITY)
Admission: EM | Admit: 2020-03-12 | Discharge: 2020-03-12 | Disposition: A | Discharge status: Home / Self Care | Payer: Medicare Other | Attending: Emergency Medicine | Admitting: Emergency Medicine

## 2020-03-12 ENCOUNTER — Other Ambulatory Visit: Payer: Self-pay

## 2020-03-12 ENCOUNTER — Emergency Department (HOSPITAL_COMMUNITY): Payer: Medicare Other

## 2020-03-12 ENCOUNTER — Other Ambulatory Visit (HOSPITAL_COMMUNITY): Payer: Self-pay | Admitting: Physician Assistant

## 2020-03-12 ENCOUNTER — Encounter (HOSPITAL_COMMUNITY): Payer: Self-pay | Admitting: Emergency Medicine

## 2020-03-12 DIAGNOSIS — Z79899 Other long term (current) drug therapy: Secondary | ICD-10-CM | POA: Diagnosis not present

## 2020-03-12 DIAGNOSIS — E119 Type 2 diabetes mellitus without complications: Secondary | ICD-10-CM | POA: Insufficient documentation

## 2020-03-12 DIAGNOSIS — L03116 Cellulitis of left lower limb: Secondary | ICD-10-CM | POA: Diagnosis not present

## 2020-03-12 DIAGNOSIS — M79672 Pain in left foot: Secondary | ICD-10-CM

## 2020-03-12 DIAGNOSIS — I10 Essential (primary) hypertension: Secondary | ICD-10-CM | POA: Insufficient documentation

## 2020-03-12 DIAGNOSIS — Z7984 Long term (current) use of oral hypoglycemic drugs: Secondary | ICD-10-CM | POA: Diagnosis not present

## 2020-03-12 DIAGNOSIS — R52 Pain, unspecified: Secondary | ICD-10-CM | POA: Diagnosis not present

## 2020-03-12 DIAGNOSIS — Z743 Need for continuous supervision: Secondary | ICD-10-CM | POA: Diagnosis not present

## 2020-03-12 DIAGNOSIS — M7989 Other specified soft tissue disorders: Secondary | ICD-10-CM | POA: Diagnosis not present

## 2020-03-12 DIAGNOSIS — Z8616 Personal history of COVID-19: Secondary | ICD-10-CM | POA: Diagnosis not present

## 2020-03-12 DIAGNOSIS — R609 Edema, unspecified: Secondary | ICD-10-CM | POA: Diagnosis not present

## 2020-03-12 LAB — CBC WITH DIFFERENTIAL/PLATELET
Abs Immature Granulocytes: 0.06 10*3/uL (ref 0.00–0.07)
Basophils Absolute: 0.1 10*3/uL (ref 0.0–0.1)
Basophils Relative: 0 %
Eosinophils Absolute: 0.1 10*3/uL (ref 0.0–0.5)
Eosinophils Relative: 1 %
HCT: 40.8 % (ref 36.0–46.0)
Hemoglobin: 12.1 g/dL (ref 12.0–15.0)
Immature Granulocytes: 1 %
Lymphocytes Relative: 25 %
Lymphs Abs: 2.8 10*3/uL (ref 0.7–4.0)
MCH: 23.4 pg — ABNORMAL LOW (ref 26.0–34.0)
MCHC: 29.7 g/dL — ABNORMAL LOW (ref 30.0–36.0)
MCV: 78.9 fL — ABNORMAL LOW (ref 80.0–100.0)
Monocytes Absolute: 1 10*3/uL (ref 0.1–1.0)
Monocytes Relative: 8 %
Neutro Abs: 7.4 10*3/uL (ref 1.7–7.7)
Neutrophils Relative %: 65 %
Platelets: 311 10*3/uL (ref 150–400)
RBC: 5.17 MIL/uL — ABNORMAL HIGH (ref 3.87–5.11)
RDW: 14.8 % (ref 11.5–15.5)
WBC: 11.4 10*3/uL — ABNORMAL HIGH (ref 4.0–10.5)
nRBC: 0 % (ref 0.0–0.2)

## 2020-03-12 LAB — COMPREHENSIVE METABOLIC PANEL
ALT: 12 U/L (ref 0–44)
AST: 14 U/L — ABNORMAL LOW (ref 15–41)
Albumin: 3.7 g/dL (ref 3.5–5.0)
Alkaline Phosphatase: 61 U/L (ref 38–126)
Anion gap: 12 (ref 5–15)
BUN: 15 mg/dL (ref 6–20)
CO2: 29 mmol/L (ref 22–32)
Calcium: 9.1 mg/dL (ref 8.9–10.3)
Chloride: 97 mmol/L — ABNORMAL LOW (ref 98–111)
Creatinine, Ser: 1.09 mg/dL — ABNORMAL HIGH (ref 0.44–1.00)
GFR, Estimated: 59 mL/min — ABNORMAL LOW (ref >60)
Glucose, Bld: 111 mg/dL — ABNORMAL HIGH (ref 70–99)
Potassium: 3.2 mmol/L — ABNORMAL LOW (ref 3.5–5.1)
Sodium: 138 mmol/L (ref 135–145)
Total Bilirubin: 0.5 mg/dL (ref 0.3–1.2)
Total Protein: 8.9 g/dL — ABNORMAL HIGH (ref 6.5–8.1)

## 2020-03-12 MED ORDER — STERILE WATER FOR INJECTION IJ SOLN
INTRAMUSCULAR | Status: AC
  Filled 2020-03-12: qty 10

## 2020-03-12 MED ORDER — KETOROLAC TROMETHAMINE 60 MG/2ML IM SOLN
60.0000 mg | Freq: Once | INTRAMUSCULAR | Status: AC
  Administered 2020-03-12: 60 mg via INTRAMUSCULAR
  Filled 2020-03-12: qty 2

## 2020-03-12 MED ORDER — CEFTRIAXONE SODIUM 1 G IJ SOLR
1.0000 g | Freq: Once | INTRAMUSCULAR | Status: AC
  Administered 2020-03-12: 1 g via INTRAMUSCULAR
  Filled 2020-03-12: qty 10

## 2020-03-12 MED ORDER — DICLOFENAC SODIUM 75 MG PO TBEC: 75.0000 mg | DELAYED_RELEASE_TABLET | Freq: Two times a day (BID) | ORAL | 0 refills | Status: DC

## 2020-03-12 MED ORDER — CEPHALEXIN 500 MG PO CAPS: 500.0000 mg | ORAL_CAPSULE | Freq: Four times a day (QID) | ORAL | 0 refills | Status: DC

## 2020-03-12 NOTE — ED Triage Notes (Signed)
Per EMS, patient c/o pain and swelling to left foot x3 days. Denies trauma or injury. Hx of same in right foot.

## 2020-03-12 NOTE — ED Provider Notes (Signed)
[facility] DEPT Provider Note   CSN: [id number] Arrival date & time: 03/12/20  1757     History Chief Complaint  Patient presents with  . Foot Pain    [name] is a 59 y.o. female.  The history is provided by the patient.  Foot Pain This is a new problem. Episode onset: 3 days.  The problem occurs constantly. The problem has been gradually improving. Nothing aggravates the symptoms. Nothing relieves the symptoms. She has tried nothing for the symptoms. The treatment provided no relief.  Pt reports she has swelling and pain to her foot and ankle.  Pt reports she has pain with walking  Pt denies any injury      Past Medical History:  Diagnosis Date  . Headache   . Hypertension   . Iron deficiency anemia   . Obesity   . Seizures ([place])   . Stroke ([place])   . Type 2 diabetes mellitus [facility])     Patient Active Problem List   Diagnosis Date Noted  . Rotator cuff impingement syndrome, right 03/09/2020  . Chronic right shoulder pain 03/09/2020  . Acute hypoxemic respiratory failure due to COVID-19 ([place]) 11/10/2019  . Urinary frequency 06/24/2019  . 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 BRBPR (bright red blood per rectum)   . Polyp of cecum   . 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 Empty sella ([place]) 02/24/2018  . Morbid obesity due to excess calories ([place]) 02/05/2018  . 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 History of stroke 07/16/2017  . Headache 04/02/2017  . Seizure disorder ([place]) 10/02/2016  . Obstructive sleep apnea 11/29/2015  . Right shoulder pain 11/29/2015  . Adjustment disorder with depressed mood 10/19/2015  . Nocturnal enuresis   . Hypokalemia   . Anemia, iron deficiency   . Super obese   . Type 2 diabetes mellitus ([place])   . Orthostatic hypotension   . Supplemental oxygen dependent   . Diastolic dysfunction   . Tachypnea   . AKI (acute kidney  injury) ([place])   . Acute blood loss anemia   . Lethargy   . Wheezing   . Benign essential HTN   . ICH (intracerebral hemorrhage) ([place]) 10/06/2015    Past Surgical History:  Procedure Laterality Date  . CARDIAC CATHETERIZATION  04/2010   Dr. Terrence Dupont  . COLONOSCOPY WITH PROPOFOL N/A 04/06/2019   Procedure: COLONOSCOPY WITH PROPOFOL;  Surgeon: Mauri Pole, MD;  [facility];  Service: Endoscopy;  Laterality: N/A;  . POLYPECTOMY  04/06/2019   Procedure: POLYPECTOMY;  Surgeon: Mauri Pole, MD;  [facility];  Service: Endoscopy;;     OB History   No obstetric history on file.     Family History  Problem Relation Age of Onset  . Diabetes Father   . Hypertension Father   . Hypertension Sister   . Stroke Brother     Social History   Tobacco Use  . Smoking status: Never Smoker  . Smokeless tobacco: Never Used  Vaping Use  . Vaping Use: Never used  Substance Use Topics  . Alcohol use: No  . Drug use: No    Home Medications Prior to Admission medications   Medication Sig Start Date End Date Taking? Authorizing Provider  meclizine (ANTIVERT) 25 MG tablet TAKE 1 TABLET (25 MG TOTAL) BY MOUTH 2 (TWO) TIMES DAILY AS NEEDED FOR DIZZINESS. MUST HAVE OFFICE VISIT FOR REFILLS 02/09/20  Charlott Rakes, MD  Accu-Chek FastClix Lancets MISC Check blood sugar up to 3 times daily. E11.59 02/22/19   Charlott Rakes, MD  acetaminophen (TYLENOL) 500 MG tablet Take 500-1,000 mg by mouth every 6 (six) hours as needed for mild pain or headache.     [provider]  atorvastatin (LIPITOR) 20 MG tablet Take 1 tablet (20 mg total) by mouth daily. 11/30/19   Charlott Rakes, MD  Blood Glucose Monitoring Suppl (ACCU-CHEK GUIDE ME) w/Device KIT 1 kit by Does not apply route daily. Check blood sugar up to 3 times daily. E11.59 02/22/19   Charlott Rakes, MD  cephALEXin (KEFLEX) 500 MG capsule Take 1 capsule (500 mg total) by mouth 4 (four) times daily for 7 days.  03/12/20 03/19/20  Fransico Meadow, PA-C  cetirizine (ZYRTEC) 10 MG tablet Take 10 mg by mouth daily as needed for allergies.     [provider]  diclofenac (VOLTAREN) 75 MG EC tablet Take 1 tablet (75 mg total) by mouth 2 (two) times daily. 03/12/20   Fransico Meadow, PA-C  diclofenac Sodium (VOLTAREN) 1 % GEL Apply 1 application topically 4 (four) times daily as needed (pain.). OTC    [provider]  diphenhydrAMINE (BENADRYL) 25 MG tablet Take 25 mg by mouth at bedtime.     [provider]  fluticasone (FLONASE) 50 MCG/ACT nasal spray Place 1 spray into both nostrils daily as needed for allergies or rhinitis. 10/20/18   Charlott Rakes, MD  glipiZIDE (GLUCOTROL) 5 MG tablet Take 0.5 tablets (2.5 mg total) by mouth 2 (two) times daily before a meal. Patient taking differently: Take 2.5 mg by mouth 2 (two) times daily before a meal.  11/30/19   Newlin, Enobong, MD  glucose blood (ACCU-CHEK GUIDE) test strip Check blood sugar up to 3 times daily. E11.59 02/22/19   Charlott Rakes, MD  hydrocortisone (ANUSOL-HC) 25 MG suppository Place 1 suppository (25 mg total) rectally 2 (two) times daily. 11/16/19   Bonnielee Haff, MD  hydroxypropyl methylcellulose / hypromellose (ISOPTO TEARS / GONIOVISC) 2.5 % ophthalmic solution Place 2 drops into both eyes as needed (irritation). Patient taking differently: Place 2 drops into both eyes 4 (four) times daily as needed (irritation).  01/25/19   Charlott Rakes, MD  levETIRAcetam (KEPPRA) 500 MG tablet TAKE 1 & 1/2 TABLETS BY MOUTH TWICE DAILY. Patient taking differently: Take 750 mg by mouth 2 (two) times daily.  04/17/18   Dennie Bible, NP  lidocaine (LIDODERM) 5 % Place 1 patch onto the skin daily. Remove & Discard patch within 12 hours or as directed by MD Patient taking differently: Place 1 patch onto the skin daily as needed (pain). Remove & Discard patch within 12 hours or as directed by MD 03/01/18   Maczis, Barth Kirks, PA-C   lisinopril (ZESTRIL) 10 MG tablet Take 1 tablet (10 mg total) by mouth daily. 11/30/19   Charlott Rakes, MD  metFORMIN (GLUCOPHAGE XR) 500 MG 24 hr tablet Take 2 tablets (1,000 mg total) by mouth 2 (two) times daily. 11/30/19   Charlott Rakes, MD  methocarbamol (ROBAXIN-750) 750 MG tablet Take 1 tablet (750 mg total) by mouth in the morning and at bedtime. 03/09/20   Jamse Arn, MD  metoprolol tartrate (LOPRESSOR) 100 MG tablet Take 1 tablet (100 mg total) by mouth 2 (two) times daily. 11/30/19   Charlott Rakes, MD  pantoprazole (PROTONIX) 40 MG tablet Take 1 tablet (40 mg total) by mouth at bedtime for 10 days. 11/16/19  11/26/19  Bonnielee Haff, MD  predniSONE (DELTASONE) 20 MG tablet Take 3 tablets once daily for 3 days followed by 2 tablets once daily for 3 days followed by 1 tablet once daily for 3 days and then stop Patient not taking: Reported on 11/30/2019 11/16/19   Bonnielee Haff, MD  tiZANidine (ZANAFLEX) 2 MG tablet Take 1 tablet (2 mg total) by mouth every 8 (eight) hours as needed for muscle spasms. 07/05/19   Jamse Arn, MD    Allergies    Bee venom, Eggs or egg-derived products, Gabapentin, and Gadolinium derivatives  Review of Systems   Review of Systems  All other systems reviewed and are negative.   Physical Exam Updated Vital Signs BP (!) 164/95   Pulse 94   Temp 99.6 F (37.6 C) (Oral)   Resp 19   Ht '4\' 11"'  (1.499 m)   Wt 132 kg   LMP 02/17/2011 (Exact Date)   SpO2 97%   BMI 58.77 kg/m   Physical Exam Vitals and nursing note reviewed.  Constitutional:      Appearance: She is well-developed and well-nourished.  HENT:     Head: Normocephalic.  Eyes:     Extraocular Movements: EOM normal.  Cardiovascular:     Rate and Rhythm: Normal rate.  Pulmonary:     Effort: Pulmonary effort is normal.  Abdominal:     General: Abdomen is flat. There is no distension.  Musculoskeletal:        General: Normal range of motion.     Cervical back: Normal range  of motion.  Neurological:     General: No focal deficit present.     Mental Status: She is alert and oriented to person, place, and time.  Psychiatric:        Mood and Affect: Mood and affect and mood normal.     ED Results / Procedures / Treatments   Labs (all labs ordered are listed, but only abnormal results are displayed) Labs Reviewed  CBC WITH DIFFERENTIAL/PLATELET - Abnormal; Notable for the following components:      Result Value   WBC 11.4 (*)    RBC 5.17 (*)    MCV 78.9 (*)    MCH 23.4 (*)    MCHC 29.7 (*)    All other components within normal limits  COMPREHENSIVE METABOLIC PANEL - Abnormal; Notable for the following components:   Potassium 3.2 (*)    Chloride 97 (*)    Glucose, Bld 111 (*)    Creatinine, Ser 1.09 (*)    Total Protein 8.9 (*)    AST 14 (*)    GFR, Estimated 59 (*)    All other components within normal limits    EKG None  Radiology DG Foot Complete Left  Result Date: 03/12/2020 CLINICAL DATA:  History of diabetes with atraumatic left foot pain and swelling. EXAM: LEFT FOOT - COMPLETE 3+ VIEW COMPARISON:  None. FINDINGS: There is no evidence of fracture or dislocation. There is no evidence of arthropathy or other focal bone abnormality. Marked severity diffuse soft tissue swelling is noted. IMPRESSION: Marked severity diffuse soft tissue swelling without evidence of acute osseous abnormality. Electronically Signed   By: Virgina Norfolk M.D.   On: 03/12/2020 19:25    Procedures Procedures (including critical care time)  Medications Ordered in ED Medications  sterile water (preservative free) injection (has no administration in time range)  cefTRIAXone (ROCEPHIN) injection 1 g (1 g Intramuscular Given 03/12/20 2118)  ketorolac (TORADOL) injection 60 mg (  60 mg Intramuscular Given 03/12/20 2118)    ED Course  I have reviewed the triage vital signs and the nursing notes.  Pertinent labs & imaging results that were available during my care  of the patient were reviewed by me and considered in my medical decision making (see chart for details).    MDM Rules/Calculators/A&P                          MDM: xray is normal, Pt has elevated wbc count. Pt might have cellulitis.  I will treat with keflex and voltaren.  Pt advised to recheck with her MD or at [place] care tomorrow.  Pt to return if worse.  Final Clinical Impression(s) / ED Diagnoses Final diagnoses:  Foot pain, left    Rx / DC Orders ED Discharge Orders         Ordered    cephALEXin (KEFLEX) 500 MG capsule  4 times daily        03/12/20 2103    diclofenac (VOLTAREN) 75 MG EC tablet  2 times daily        03/12/20 2103        An After Visit Summary was printed and given to the patient.    Sidney Ace 03/12/20 2255    Lacretia Leigh, MD 03/15/20 [phone]

## 2020-03-13 ENCOUNTER — Ambulatory Visit: Payer: Medicare Other | Admitting: Physician Assistant

## 2020-03-13 ENCOUNTER — Other Ambulatory Visit: Payer: Self-pay

## 2020-03-13 VITALS — BP 150/82 | HR 69 | Resp 16

## 2020-03-13 DIAGNOSIS — E1159 Type 2 diabetes mellitus with other circulatory complications: Secondary | ICD-10-CM

## 2020-03-13 DIAGNOSIS — L03116 Cellulitis of left lower limb: Secondary | ICD-10-CM | POA: Diagnosis not present

## 2020-03-13 DIAGNOSIS — D72829 Elevated white blood cell count, unspecified: Secondary | ICD-10-CM

## 2020-03-13 LAB — GLUCOSE, POCT (MANUAL RESULT ENTRY): POC Glucose: 124 mg/dl — AB (ref 70–99)

## 2020-03-13 MED FILL — CEPHALEXIN 500 MG CAPSULE: 500 | 7 days supply | Qty: 28 | Fill #0

## 2020-03-13 MED FILL — DICLOFENAC SOD EC 75 MG TAB: 75 | 7 days supply | Qty: 14 | Fill #0

## 2020-03-13 NOTE — Progress Notes (Signed)
Established Patient Office Visit  Subjective:  Patient ID: [name], female    DOB: Jul 24, 1960  Age: 59 y.o. MRN: [id number]  CC:  Chief Complaint  Patient presents with  . Pain    Left foot     HPI [name] reports that she started having left foot pain 4 days ago, denies inuury / trauma  States that she was seen in the ED yesterday :    MDM: xray is normal, Pt has elevated wbc count. Pt might have cellulitis.  I will treat with keflex and voltaren.  Pt advised to recheck with her MD or at [facility] tomorrow.  Pt to return if worse.   Was given rocephin in ED  Imaging:   IMPRESSION: Marked severity diffuse soft tissue swelling without evidence of acute osseous abnormality.   Electronically Signed   By: Virgina Norfolk M.D.   On: 03/12/2020 19:25  States that since she has been home, she does feel that the pain is improving slightly, swelling in toes has reduced, is able to wiggle her toes.  Reports that she did take 1 dose of diclofenac and 1 dose of Keflex today.  Endorses history of one episode of gout several years ago.  Past Medical History:  Diagnosis Date  . Headache   . Hypertension   . Iron deficiency anemia   . Obesity   . Seizures ([place])   . Stroke ([place])   . Type 2 diabetes mellitus ([place])     Past Surgical History:  Procedure Laterality Date  . CARDIAC CATHETERIZATION  04/2010   Dr. Terrence Dupont  . COLONOSCOPY WITH PROPOFOL N/A 04/06/2019   Procedure: COLONOSCOPY WITH PROPOFOL;  Surgeon: Mauri Pole, MD;  Location: [facility];  Service: Endoscopy;  Laterality: N/A;  . POLYPECTOMY  04/06/2019   Procedure: POLYPECTOMY;  Surgeon: Mauri Pole, MD;  Location: [facility];  Service: Endoscopy;;    Family History  Problem Relation Age of Onset  . Diabetes Father   . Hypertension Father   . Hypertension Sister   . Stroke Brother     Social History   Socioeconomic History  . Marital status: Married    Spouse  name: Not on file  . Number of children: Not on file  . Years of education: Not on file  . Highest education level: Not on file  Occupational History  . Not on file  Tobacco Use  . Smoking status: Never Smoker  . Smokeless tobacco: Never Used  Vaping Use  . Vaping Use: Never used  Substance and Sexual Activity  . Alcohol use: No  . Drug use: No  . Sexual activity: Not Currently  Other Topics Concern  . Not on file  Social History Narrative  . Not on file   Social Determinants of Health   Financial Resource Strain: Not on file  Food Insecurity: Not on file  Transportation Needs: Not on file  Physical Activity: Not on file  Stress: Not on file  Social Connections: Not on file  Intimate Partner Violence: Not on file    Outpatient Medications Prior to Visit  Medication Sig Dispense Refill  . Accu-Chek FastClix Lancets MISC Check blood sugar up to 3 times daily. E11.59 102 each 6  . acetaminophen (TYLENOL) 500 MG tablet Take 500-1,000 mg by mouth every 6 (six) hours as needed for mild pain or headache.     Marland Kitchen atorvastatin (LIPITOR) 20 MG tablet Take 1 tablet (20 mg total) by mouth daily.  30 tablet 6  . cephALEXin (KEFLEX) 500 MG capsule Take 1 capsule (500 mg total) by mouth 4 (four) times daily for 7 days. 28 capsule 0  . cetirizine (ZYRTEC) 10 MG tablet Take 10 mg by mouth daily as needed for allergies.     Marland Kitchen diclofenac (VOLTAREN) 75 MG EC tablet Take 1 tablet (75 mg total) by mouth 2 (two) times daily. 14 tablet 0  . diclofenac Sodium (VOLTAREN) 1 % GEL Apply 1 application topically 4 (four) times daily as needed (pain.). OTC    . diphenhydrAMINE (BENADRYL) 25 MG tablet Take 25 mg by mouth at bedtime.    . fluticasone (FLONASE) 50 MCG/ACT nasal spray Place 1 spray into both nostrils daily as needed for allergies or rhinitis. 16 g 1  . glipiZIDE (GLUCOTROL) 5 MG tablet Take 0.5 tablets (2.5 mg total) by mouth 2 (two) times daily before a meal. (Patient taking differently: Take  2.5 mg by mouth 2 (two) times daily before a meal.) 30 tablet 6  . hydrocortisone (ANUSOL-HC) 25 MG suppository Place 1 suppository (25 mg total) rectally 2 (two) times daily. 12 suppository 0  . hydroxypropyl methylcellulose / hypromellose (ISOPTO TEARS / GONIOVISC) 2.5 % ophthalmic solution Place 2 drops into both eyes as needed (irritation). (Patient taking differently: Place 2 drops into both eyes 4 (four) times daily as needed (irritation).) 15 mL 1  . levETIRAcetam (KEPPRA) 500 MG tablet TAKE 1 & 1/2 TABLETS BY MOUTH TWICE DAILY. (Patient taking differently: Take 750 mg by mouth 2 (two) times daily.) 90 tablet 8  . lidocaine (LIDODERM) 5 % Place 1 patch onto the skin daily. Remove & Discard patch within 12 hours or as directed by MD (Patient taking differently: Place 1 patch onto the skin daily as needed (pain). Remove & Discard patch within 12 hours or as directed by MD) 30 patch 0  . lisinopril (ZESTRIL) 10 MG tablet Take 1 tablet (10 mg total) by mouth daily. 30 tablet 6  . meclizine (ANTIVERT) 25 MG tablet TAKE 1 TABLET (25 MG TOTAL) BY MOUTH 2 (TWO) TIMES DAILY AS NEEDED FOR DIZZINESS. MUST HAVE OFFICE VISIT FOR REFILLS 60 tablet 0  . metFORMIN (GLUCOPHAGE XR) 500 MG 24 hr tablet Take 2 tablets (1,000 mg total) by mouth 2 (two) times daily. 120 tablet 6  . methocarbamol (ROBAXIN-750) 750 MG tablet Take 1 tablet (750 mg total) by mouth in the morning and at bedtime. 60 tablet 2  . Blood Glucose Monitoring Suppl (ACCU-CHEK GUIDE ME) w/Device KIT 1 kit by Does not apply route daily. Check blood sugar up to 3 times daily. E11.59 1 kit 0  . glucose blood (ACCU-CHEK GUIDE) test strip Check blood sugar up to 3 times daily. E11.59 100 each 6  . metoprolol tartrate (LOPRESSOR) 100 MG tablet Take 1 tablet (100 mg total) by mouth 2 (two) times daily. 60 tablet 6  . pantoprazole (PROTONIX) 40 MG tablet Take 1 tablet (40 mg total) by mouth at bedtime for 10 days. 10 tablet 0  . predniSONE (DELTASONE) 20  MG tablet Take 3 tablets once daily for 3 days followed by 2 tablets once daily for 3 days followed by 1 tablet once daily for 3 days and then stop (Patient not taking: Reported on 03/13/2020) 18 tablet 0  . tiZANidine (ZANAFLEX) 2 MG tablet Take 1 tablet (2 mg total) by mouth every 8 (eight) hours as needed for muscle spasms. (Patient not taking: Reported on 03/13/2020) 90 tablet 0   No  facility-administered medications prior to visit.    Allergies  Allergen Reactions  . Bee Venom Swelling    Facial swelling  . Eggs Or Egg-Derived Products Nausea And Vomiting and Swelling    Mouth swelled  . Gabapentin Other (See Comments)    Jittery feeling, dyspnea, crawling sensation  . Gadolinium Derivatives Itching    Immediately after gad injection pt. complained of tongue numbness and lower lip/ then had rt side of body tingling/ pt. Was assessed by Dr. Jeralyn Ruths before leaving/ no medications were administered/    ROS Review of Systems  Constitutional: Negative for chills and fever.  HENT: Negative.   Eyes: Negative.   Respiratory: Negative.   Cardiovascular: Negative for palpitations.  Gastrointestinal: Negative.   Endocrine: Negative.   Genitourinary: Negative.   Musculoskeletal: Positive for gait problem.  Skin: Negative for color change and wound.  Allergic/Immunologic: Negative.   Neurological: Negative for dizziness, light-headedness and headaches.  Hematological: Negative.   Psychiatric/Behavioral: Negative.       Objective:    Physical Exam Vitals and nursing note reviewed.  Constitutional:      General: She is not in acute distress.    Appearance: Normal appearance. She is obese. She is not ill-appearing.     Comments: In wheelchair  HENT:     Head: Normocephalic and atraumatic.     Right Ear: External ear normal.     Left Ear: External ear normal.     Nose: Nose normal.     Mouth/Throat:     Mouth: Mucous membranes are moist.     Pharynx: Oropharynx is clear.  Eyes:      Extraocular Movements: Extraocular movements intact.     Conjunctiva/sclera: Conjunctivae normal.     Pupils: Pupils are equal, round, and reactive to light.  Cardiovascular:     Rate and Rhythm: Normal rate and regular rhythm.     Pulses: Normal pulses.     Heart sounds: Normal heart sounds.  Pulmonary:     Breath sounds: Normal breath sounds.  Musculoskeletal:     Cervical back: Normal range of motion and neck supple.     Right lower leg: Normal.     Left lower leg: Normal.     Right ankle: Normal.     Left ankle: Normal.     Right foot: Normal.     Left foot: Decreased range of motion. Swelling and tenderness present.  Feet:     Right foot:     Skin integrity: Dry skin present.     Left foot:     Skin integrity: Dry skin present.  Skin:    General: Skin is warm and dry.  Neurological:     General: No focal deficit present.     Mental Status: She is alert and oriented to person, place, and time.  Psychiatric:        Mood and Affect: Mood normal.        Behavior: Behavior normal.        Thought Content: Thought content normal.        Judgment: Judgment normal.     BP (!) 150/82   Pulse 69   Resp 16   LMP 02/17/2011 (Exact Date)   SpO2 98%  Wt Readings from Last 3 Encounters:  03/12/20 291 lb (132 kg)  03/09/20 292 lb (132.5 kg)  02/14/20 291 lb (132 kg)     Health Maintenance Due  Topic Date Due  . PNEUMOCOCCAL POLYSACCHARIDE VACCINE AGE 84-64 HIGH RISK  Never done  . COVID-19 Vaccine (1) Never done  . MAMMOGRAM  01/13/2020  . FOOT EXAM  02/15/2020    There are no preventive care reminders to display for this patient.  Lab Results  Component Value Date   TSH 1.99 02/24/2018   Lab Results  Component Value Date   WBC 11.4 (H) 03/12/2020   HGB 12.1 03/12/2020   HCT 40.8 03/12/2020   MCV 78.9 (L) 03/12/2020   PLT 311 03/12/2020   Lab Results  Component Value Date   NA 138 03/12/2020   K 3.2 (L) 03/12/2020   CO2 29 03/12/2020   GLUCOSE 111 (H)  03/12/2020   BUN 15 03/12/2020   CREATININE 1.09 (H) 03/12/2020   BILITOT 0.5 03/12/2020   ALKPHOS 61 03/12/2020   AST 14 (L) 03/12/2020   ALT 12 03/12/2020   PROT 8.9 (H) 03/12/2020   ALBUMIN 3.7 03/12/2020   CALCIUM 9.1 03/12/2020   ANIONGAP 12 03/12/2020   GFR 77.96 02/24/2018   Lab Results  Component Value Date   CHOL 74 (L) 10/23/2018   Lab Results  Component Value Date   HDL 36 (L) 10/23/2018   Lab Results  Component Value Date   LDLCALC 24 10/23/2018   Lab Results  Component Value Date   TRIG 63 11/10/2019   Lab Results  Component Value Date   CHOLHDL 2.1 10/23/2018   Lab Results  Component Value Date   HGBA1C 6.4 (H) 11/10/2019      Assessment & Plan:   Problem List Items Addressed This Visit      Endocrine   Type 2 diabetes mellitus ([place])   Relevant Orders   Glucose (CBG) (Completed)    Other Visit Diagnoses    Cellulitis of left lower extremity    -  Primary   Leukocytosis, unspecified type        1. Cellulitis of left lower extremity Patient education given on gout versus cellulitis, continue current regimen as prescribed by emergency department.  Red flag warnings given for prompt reevaluation  2. Leukocytosis, unspecified type   3. Type 2 diabetes mellitus with other circulatory complication, without long-term current use of insulin (HCC)  - Glucose (CBG)   No orders of the defined types were placed in this encounter.   I have reviewed the patient's medical history (PMH, PSH, Social History, Family History, Medications, and allergies) , and have been updated if relevant. I spent 20 minutes reviewing chart and  face to face time with patient.     Follow-up: Return if symptoms worsen or fail to improve.    Loraine Grip Mayers, PA-C

## 2020-03-13 NOTE — Patient Instructions (Signed)
I encourage you to continue using the diclofenac and the Keflex as prescribed in the emergency department.  When you are pain has resolved, I encourage you to follow-up in the mobile unit, I will be happy to check your uric acid level at that time as we discussed.  Please return to the mobile unit on Thursday, December 23 if your symptoms have not started to improve.  I hope that you feel better soon  Mary Rad, PA-C Physician Assistant [email or web address]    Gout  Gout is a condition that causes painful swelling of the joints. Gout is a type of inflammation of the joints (arthritis). This condition is caused by having too much uric acid in the body. Uric acid is a chemical that forms when the body breaks down substances called purines. Purines are important for building body proteins. When the body has too much uric acid, sharp crystals can form and build up inside the joints. This causes pain and swelling. Gout attacks can happen quickly and may be very painful (acute gout). Over time, the attacks can affect more joints and become more frequent (chronic gout). Gout can also cause uric acid to build up under the skin and inside the kidneys. What are the causes? This condition is caused by too much uric acid in your blood. This can happen because:  Your kidneys do not remove enough uric acid from your blood. This is the most common cause.  Your body makes too much uric acid. This can happen with some cancers and cancer treatments. It can also occur if your body is breaking down too many red blood cells (hemolytic anemia).  You eat too many foods that are high in purines. These foods include organ meats and some seafood. Alcohol, especially beer, is also high in purines. A gout attack may be triggered by trauma or stress. What increases the risk? You are more likely to develop this condition if you:  Have a family  history of gout.  Are female and middle-aged.  Are female and have gone through menopause.  Are obese.  Frequently drink alcohol, especially beer.  Are dehydrated.  Lose weight too quickly.  Have an organ transplant.  Have lead poisoning.  Take certain medicines, including aspirin, cyclosporine, diuretics, levodopa, and niacin.  Have kidney disease.  Have a skin condition called psoriasis. What are the signs or symptoms? An attack of acute gout happens quickly. It usually occurs in just one joint. The most common place is the big toe. Attacks often start at night. Other joints that may be affected include joints of the feet, ankle, knee, fingers, wrist, or elbow. Symptoms of this condition may include:  Severe pain.  Warmth.  Swelling.  Stiffness.  Tenderness. The affected joint may be very painful to touch.  Shiny, red, or purple skin.  Chills and fever. Chronic gout may cause symptoms more frequently. More joints may be involved. You may also have white or yellow lumps (tophi) on your hands or feet or in other areas near your joints. How is this diagnosed? This condition is diagnosed based on your symptoms, medical history, and physical exam. You may have tests, such as:  Blood tests to measure uric acid levels.  Removal of joint fluid with a thin needle (aspiration) to look for uric acid crystals.  X-rays to look for joint damage. How is this treated? Treatment for this condition has two phases: treating an acute attack and preventing future attacks. Acute gout  treatment may include medicines to reduce pain and swelling, including:  NSAIDs.  Steroids. These are strong anti-inflammatory medicines that can be taken by mouth (orally) or injected into a joint.  Colchicine. This medicine relieves pain and swelling when it is taken soon after an attack. It can be given by mouth or through an IV. Preventive treatment may include:  Daily use of smaller doses of  NSAIDs or colchicine.  Use of a medicine that reduces uric acid levels in your blood.  Changes to your diet. You may need to see a dietitian about what to eat and drink to prevent gout. Follow these instructions at home: During a gout attack   If directed, put ice on the affected area: ? Put ice in a plastic bag. ? Place a towel between your skin and the bag. ? Leave the ice on for 20 minutes, 2-3 times a day.  Raise (elevate) the affected joint above the level of your heart as often as possible.  Rest the joint as much as possible. If the affected joint is in your leg, you may be given crutches to use.  Follow instructions from your health care provider about eating or drinking restrictions. Avoiding future gout attacks  Follow a low-purine diet as told by your dietitian or health care provider. Avoid foods and drinks that are high in purines, including liver, kidney, anchovies, asparagus, herring, mushrooms, mussels, and beer.  Maintain a healthy weight or lose weight if you are overweight. If you want to lose weight, talk with your health care provider. It is important that you do not lose weight too quickly.  Start or maintain an exercise program as told by your health care provider. Eating and drinking  Drink enough fluids to keep your urine pale yellow.  If you drink alcohol: ? Limit how much you use to:  0-1 drink a day for women.  0-2 drinks a day for men. ? Be aware of how much alcohol is in your drink. In the [place] one drink equals one 12 oz bottle of beer (355 mL) one 5 oz glass of wine (148 mL), or one 1 oz glass of hard liquor (44 mL). General instructions  Take over-the-counter and prescription medicines only as told by your health care provider.  Do not drive or use heavy machinery while taking prescription pain medicine.  Return to your normal activities as told by your health care provider. Ask your health care provider what activities are safe for  you.  Keep all follow-up visits as told by your health care provider. This is important. Contact a health care provider if you have:  Another gout attack.  Continuing symptoms of a gout attack after 10 days of treatment.  Side effects from your medicines.  Chills or a fever.  Burning pain when you urinate.  Pain in your lower back or belly. Get help right away if you:  Have severe or uncontrolled pain.  Cannot urinate. Summary  Gout is painful swelling of the joints caused by inflammation.  The most common site of pain is the big toe, but it can affect other joints in the body.  Medicines and dietary changes can help to prevent and treat gout attacks. This information is not intended to replace advice given to you by your health care provider. Make sure you discuss any questions you have with your health care provider. Document Revised: 10/01/2017 Document Reviewed: 10/01/2017 Elsevier Patient Education  [place].   Cellulitis, Adult  Cellulitis  is a skin infection. The infected area is usually warm, red, swollen, and tender. This condition occurs most often in the arms and lower legs. The infection can travel to the muscles, blood, and underlying tissue and become serious. It is very important to get treated for this condition. What are the causes? Cellulitis is caused by bacteria. The bacteria enter through a break in the skin, such as a cut, burn, insect bite, open sore, or crack. What increases the risk? This condition is more likely to occur in people who:  Have a weak body defense system (immune system).  Have open wounds on the skin, such as cuts, burns, bites, and scrapes. Bacteria can enter the body through these open wounds.  Are older than 59 years of age.  Have diabetes.  Have a type of long-lasting (chronic) liver disease (cirrhosis) or kidney disease.  Are obese.  Have a skin condition such as: ? Itchy rash (eczema). ? Slow movement of  blood in the veins (venous stasis). ? Fluid buildup below the skin (edema).  Have had radiation therapy.  Use IV drugs. What are the signs or symptoms? Symptoms of this condition include:  Redness, streaking, or spotting on the skin.  Swollen area of the skin.  Tenderness or pain when an area of the skin is touched.  Warm skin.  A fever.  Chills.  Blisters. How is this diagnosed? This condition is diagnosed based on a medical history and physical exam. You may also have tests, including:  Blood tests.  Imaging tests. How is this treated? Treatment for this condition may include:  Medicines, such as antibiotic medicines or medicines to treat allergies (antihistamines).  Supportive care, such as rest and application of cold or warm cloths (compresses) to the skin.  Hospital care, if the condition is severe. The infection usually starts to get better within 1-2 days of treatment. Follow these instructions at home:  Medicines  Take over-the-counter and prescription medicines only as told by your health care provider.  If you were prescribed an antibiotic medicine, take it as told by your health care provider. Do not stop taking the antibiotic even if you start to feel better. General instructions  Drink enough fluid to keep your urine pale yellow.  Do not touch or rub the infected area.  Raise (elevate) the infected area above the level of your heart while you are sitting or lying down.  Apply warm or cold compresses to the affected area as told by your health care provider.  Keep all follow-up visits as told by your health care provider. This is important. These visits let your health care provider make sure a more serious infection is not developing. Contact a health care provider if:  You have a fever.  Your symptoms do not begin to improve within 1-2 days of starting treatment.  Your bone or joint underneath the infected area becomes painful after the skin  has healed.  Your infection returns in the same area or another area.  You notice a swollen bump in the infected area.  You develop new symptoms.  You have a general ill feeling (malaise) with muscle aches and pains. Get help right away if:  Your symptoms get worse.  You feel very sleepy.  You develop vomiting or diarrhea that persists.  You notice red streaks coming from the infected area.  Your red area gets larger or turns dark in color. These symptoms may represent a serious problem that is an emergency. Do not  wait to see if the symptoms will go away. Get medical help right away. Call your local emergency services (911 in the [place] Do not drive yourself to the hospital. Summary  Cellulitis is a skin infection. This condition occurs most often in the arms and lower legs.  Treatment for this condition may include medicines, such as antibiotic medicines or antihistamines.  Take over-the-counter and prescription medicines only as told by your health care provider. If you were prescribed an antibiotic medicine, do not stop taking the antibiotic even if you start to feel better.  Contact a health care provider if your symptoms do not begin to improve within 1-2 days of starting treatment or your symptoms get worse.  Keep all follow-up visits as told by your health care provider. This is important. These visits let your health care provider make sure that a more serious infection is not developing. This information is not intended to replace advice given to you by your health care provider. Make sure you discuss any questions you have with your health care provider. Document Revised: 07/31/2017 Document Reviewed: 07/31/2017 Elsevier Patient Education  [place].

## 2020-03-14 DIAGNOSIS — L03116 Cellulitis of left lower limb: Secondary | ICD-10-CM | POA: Insufficient documentation

## 2020-03-14 DIAGNOSIS — D72829 Elevated white blood cell count, unspecified: Secondary | ICD-10-CM | POA: Insufficient documentation

## 2020-03-16 ENCOUNTER — Ambulatory Visit: Payer: Medicare Other | Admitting: Physician Assistant

## 2020-03-16 ENCOUNTER — Other Ambulatory Visit: Payer: Self-pay

## 2020-03-16 VITALS — BP 147/84 | HR 64 | Temp 97.6°F | Resp 20 | Ht 59.0 in

## 2020-03-16 DIAGNOSIS — L03116 Cellulitis of left lower limb: Secondary | ICD-10-CM | POA: Diagnosis not present

## 2020-03-16 DIAGNOSIS — M7541 Impingement syndrome of right shoulder: Secondary | ICD-10-CM

## 2020-03-16 NOTE — Progress Notes (Signed)
New Patient Office Visit  Subjective:  Patient ID: [name], female    DOB: 1961/01/31  Age: 59 y.o. MRN: [id number]  CC: No chief complaint on file.   HPI [name] reports that she has been taking her medication as directed for her cellulitis on her left foot.  Reports that she does feel it is improving, the swelling is reducing and she is able to ambulate on it.  Reports that she will have some tingling in the foot if she is sitting down for a few minutes, states that it resolves when she is back up walking around.  Reports that she has been having more pain in her right shoulder, states that this started after her stroke in 2017 which left her right side affected, states that she did complete her physical therapy after her stroke.  Reports that she will have decreased range of movement and weakness.  Denies current pain in right shoulder.  Past Medical History:  Diagnosis Date  . Headache   . Hypertension   . Iron deficiency anemia   . Obesity   . Seizures ([place])   . Stroke ([place])   . Type 2 diabetes mellitus ([place])     Past Surgical History:  Procedure Laterality Date  . CARDIAC CATHETERIZATION  04/2010   Dr. Terrence Dupont  . COLONOSCOPY WITH PROPOFOL N/A 04/06/2019   Procedure: COLONOSCOPY WITH PROPOFOL;  Surgeon: Mauri Pole, MD;  Location: [facility];  Service: Endoscopy;  Laterality: N/A;  . POLYPECTOMY  04/06/2019   Procedure: POLYPECTOMY;  Surgeon: Mauri Pole, MD;  Location: [facility];  Service: Endoscopy;;    Family History  Problem Relation Age of Onset  . Diabetes Father   . Hypertension Father   . Hypertension Sister   . Stroke Brother     Social History   Socioeconomic History  . Marital status: Married    Spouse name: Not on file  . Number of children: Not on file  . Years of education: Not on file  . Highest education level: Not on file  Occupational History  . Not on file  Tobacco Use  . Smoking status: Never Smoker  .  Smokeless tobacco: Never Used  Vaping Use  . Vaping Use: Never used  Substance and Sexual Activity  . Alcohol use: No  . Drug use: No  . Sexual activity: Not Currently  Other Topics Concern  . Not on file  Social History Narrative  . Not on file   Social Determinants of Health   Financial Resource Strain: Not on file  Food Insecurity: Not on file  Transportation Needs: Not on file  Physical Activity: Not on file  Stress: Not on file  Social Connections: Not on file  Intimate Partner Violence: Not on file    ROS Review of Systems  Constitutional: Negative for chills and fever.  HENT: Negative.   Eyes: Negative.   Respiratory: Negative.  Negative for shortness of breath.   Cardiovascular: Negative.   Gastrointestinal: Negative.   Endocrine: Negative.   Genitourinary: Negative.   Musculoskeletal: Positive for arthralgias and gait problem.  Skin: Negative for color change.  Allergic/Immunologic: Negative.   Hematological: Negative.   Psychiatric/Behavioral: Negative.     Objective:   Today's Vitals: BP (!) 147/84 (BP Location: Left Arm, Patient Position: Sitting, Cuff Size: Normal)   Pulse 64   Temp 97.6 F (36.4 C) (Oral)   Resp 20   Ht _0  (1.499 m)   LMP 02/17/2011 (  Exact Date)   SpO2 97%   BMI 58.77 kg/m   Physical Exam Vitals and nursing note reviewed.  Constitutional:      General: She is not in acute distress.    Appearance: Normal appearance. She is obese. She is not ill-appearing.  HENT:     Head: Normocephalic and atraumatic.     Right Ear: External ear normal.     Left Ear: External ear normal.     Nose: Nose normal.     Mouth/Throat:     Mouth: Mucous membranes are moist.     Pharynx: Oropharynx is clear.  Eyes:     Extraocular Movements: Extraocular movements intact.     Conjunctiva/sclera: Conjunctivae normal.     Pupils: Pupils are equal, round, and reactive to light.  Cardiovascular:     Rate and Rhythm: Normal rate and regular  rhythm.     Pulses: Normal pulses.          Dorsalis pedis pulses are 2+ on the left side.     Heart sounds: Normal heart sounds.  Pulmonary:     Effort: Pulmonary effort is normal.     Breath sounds: Normal breath sounds.  Musculoskeletal:     Right shoulder: No swelling or tenderness. Decreased range of motion. Decreased strength.     Left shoulder: Normal.     Cervical back: Normal, normal range of motion and neck supple.     Thoracic back: Normal.     Lumbar back: Normal.     Left foot: Decreased range of motion.  Feet:     Left foot:     Skin integrity: Dry skin present.     Comments: Slight edema noted bilateral Skin:    General: Skin is warm.  Neurological:     General: No focal deficit present.     Mental Status: She is alert and oriented to person, place, and time.  Psychiatric:        Mood and Affect: Mood normal.        Behavior: Behavior normal.        Thought Content: Thought content normal.        Judgment: Judgment normal.     Assessment & Plan:   Problem List Items Addressed This Visit      Musculoskeletal and Integument   Rotator cuff impingement syndrome, right   Relevant Orders   Ambulatory referral to Orthopedic Surgery     Other   Cellulitis of left lower extremity - Primary      Outpatient Encounter Medications as of 03/16/2020  Medication Sig  . Accu-Chek FastClix Lancets MISC Check blood sugar up to 3 times daily. E11.59  . acetaminophen (TYLENOL) 500 MG tablet Take 500-1,000 mg by mouth every 6 (six) hours as needed for mild pain or headache.   Marland Kitchen atorvastatin (LIPITOR) 20 MG tablet Take 1 tablet (20 mg total) by mouth daily.  . Blood Glucose Monitoring Suppl (ACCU-CHEK GUIDE ME) w/Device KIT 1 kit by Does not apply route daily. Check blood sugar up to 3 times daily. E11.59  . cephALEXin (KEFLEX) 500 MG capsule Take 1 capsule (500 mg total) by mouth 4 (four) times daily for 7 days.  . cetirizine (ZYRTEC) 10 MG tablet Take 10 mg by mouth  daily as needed for allergies.   Marland Kitchen diclofenac (VOLTAREN) 75 MG EC tablet Take 1 tablet (75 mg total) by mouth 2 (two) times daily.  . diclofenac Sodium (VOLTAREN) 1 % GEL Apply 1 application topically 4 (  four) times daily as needed (pain.). OTC  . diphenhydrAMINE (BENADRYL) 25 MG tablet Take 25 mg by mouth at bedtime.  . fluticasone (FLONASE) 50 MCG/ACT nasal spray Place 1 spray into both nostrils daily as needed for allergies or rhinitis.  Marland Kitchen glipiZIDE (GLUCOTROL) 5 MG tablet Take 0.5 tablets (2.5 mg total) by mouth 2 (two) times daily before a meal. (Patient taking differently: Take 2.5 mg by mouth 2 (two) times daily before a meal.)  . glucose blood (ACCU-CHEK GUIDE) test strip Check blood sugar up to 3 times daily. E11.59  . hydrocortisone (ANUSOL-HC) 25 MG suppository Place 1 suppository (25 mg total) rectally 2 (two) times daily.  . hydroxypropyl methylcellulose / hypromellose (ISOPTO TEARS / GONIOVISC) 2.5 % ophthalmic solution Place 2 drops into both eyes as needed (irritation). (Patient taking differently: Place 2 drops into both eyes 4 (four) times daily as needed (irritation).)  . levETIRAcetam (KEPPRA) 500 MG tablet TAKE 1 & 1/2 TABLETS BY MOUTH TWICE DAILY. (Patient taking differently: Take 750 mg by mouth 2 (two) times daily.)  . lidocaine (LIDODERM) 5 % Place 1 patch onto the skin daily. Remove & Discard patch within 12 hours or as directed by MD (Patient taking differently: Place 1 patch onto the skin daily as needed (pain). Remove & Discard patch within 12 hours or as directed by MD)  . lisinopril (ZESTRIL) 10 MG tablet Take 1 tablet (10 mg total) by mouth daily.  . meclizine (ANTIVERT) 25 MG tablet TAKE 1 TABLET (25 MG TOTAL) BY MOUTH 2 (TWO) TIMES DAILY AS NEEDED FOR DIZZINESS. MUST HAVE OFFICE VISIT FOR REFILLS  . metFORMIN (GLUCOPHAGE XR) 500 MG 24 hr tablet Take 2 tablets (1,000 mg total) by mouth 2 (two) times daily.  . methocarbamol (ROBAXIN-750) 750 MG tablet Take 1 tablet (750  mg total) by mouth in the morning and at bedtime.  . metoprolol tartrate (LOPRESSOR) 100 MG tablet Take 1 tablet (100 mg total) by mouth 2 (two) times daily.  . pantoprazole (PROTONIX) 40 MG tablet Take 1 tablet (40 mg total) by mouth at bedtime for 10 days.  . predniSONE (DELTASONE) 20 MG tablet Take 3 tablets once daily for 3 days followed by 2 tablets once daily for 3 days followed by 1 tablet once daily for 3 days and then stop (Patient not taking: Reported on 03/13/2020)  . tiZANidine (ZANAFLEX) 2 MG tablet Take 1 tablet (2 mg total) by mouth every 8 (eight) hours as needed for muscle spasms. (Patient not taking: Reported on 03/13/2020)   No facility-administered encounter medications on file as of 03/16/2020.  1. Cellulitis of left lower extremity Encourage patient to complete regimen prescribed by emergency department on March 12, 2020.  Patient education given on proper foot care   Patient was given appointment to follow-up for diabetes care with Dr. Margarita Rana in January 2022 2. Rotator cuff impingement syndrome, right Ortho called prior to patient leaving office, patient has been for evaluation Tuesday December - Ambulatory referral to Orthopedic Surgery   I have reviewed the patient's medical history (PMH, PSH, Social History, Family History, Medications, and allergies) , and have been updated if relevant. I spent 30 minutes reviewing chart and  face to face time with patient.     Follow-up: No follow-ups on file.   Loraine Grip Mayers, PA-C

## 2020-03-16 NOTE — Patient Instructions (Signed)
Please continue to finish the medication that was prescribed here in the emergency department.  I started a referral for you to be seen by orthopedics for your right shoulder pain.  We have made you an appointment for follow-up with your primary care provider for health maintenance, please feel free to return to the mobile unit prior to that if there is anything else we can do for you  Kennieth Rad, PA-C Physician Assistant [facility] [email or web address]  Diabetes Mellitus and [place] care is an important part of your health, especially when you have diabetes. Diabetes may cause you to have problems because of poor blood flow (circulation) to your feet and legs, which can cause your skin to:  Become thinner and drier.  Break more easily.  Heal more slowly.  Peel and crack. You may also have nerve damage (neuropathy) in your legs and feet, causing decreased feeling in them. This means that you may not notice minor injuries to your feet that could lead to more serious problems. Noticing and addressing any potential problems early is the best way to prevent future foot problems. How to care for your feet Foot hygiene  Wash your feet daily with warm water and mild soap. Do not use hot water. Then, pat your feet and the areas between your toes until they are completely dry. Do not soak your feet as this can dry your skin.  Trim your toenails straight across. Do not dig under them or around the cuticle. File the edges of your nails with an emery board or nail file.  Apply a moisturizing lotion or petroleum jelly to the skin on your feet and to dry, brittle toenails. Use lotion that does not contain alcohol and is unscented. Do not apply lotion between your toes. Shoes and socks  Wear clean socks or stockings every day. Make sure they are not too tight. Do not wear knee-high stockings since they may decrease blood flow to your  legs.  Wear shoes that fit properly and have enough cushioning. Always look in your shoes before you put them on to be sure there are no objects inside.  To break in new shoes, wear them for just a few hours a day. This prevents injuries on your feet. Wounds, scrapes, corns, and calluses  Check your feet daily for blisters, cuts, bruises, sores, and redness. If you cannot see the bottom of your feet, use a mirror or ask someone for help.  Do not cut corns or calluses or try to remove them with medicine.  If you find a minor scrape, cut, or break in the skin on your feet, keep it and the skin around it clean and dry. You may clean these areas with mild soap and water. Do not clean the area with peroxide, alcohol, or iodine.  If you have a wound, scrape, corn, or callus on your foot, look at it several times a day to make sure it is healing and not infected. Check for: ? Redness, swelling, or pain. ? Fluid or blood. ? Warmth. ? Pus or a bad smell. General instructions  Do not cross your legs. This may decrease blood flow to your feet.  Do not use heating pads or hot water bottles on your feet. They may burn your skin. If you have lost feeling in your feet or legs, you may not know this is happening until it is too late.  Protect your feet from hot and cold by wearing shoes,  such as at the beach or on hot pavement.  Schedule a complete foot exam at least once a year (annually) or more often if you have foot problems. If you have foot problems, report any cuts, sores, or bruises to your health care provider immediately. Contact a health care provider if:  You have a medical condition that increases your risk of infection and you have any cuts, sores, or bruises on your feet.  You have an injury that is not healing.  You have redness on your legs or feet.  You feel burning or tingling in your legs or feet.  You have pain or cramps in your legs and feet.  Your legs or feet are  numb.  Your feet always feel cold.  You have pain around a toenail. Get help right away if:  You have a wound, scrape, corn, or callus on your foot and: ? You have pain, swelling, or redness that gets worse. ? You have fluid or blood coming from the wound, scrape, corn, or callus. ? Your wound, scrape, corn, or callus feels warm to the touch. ? You have pus or a bad smell coming from the wound, scrape, corn, or callus. ? You have a fever. ? You have a red line going up your leg. Summary  Check your feet every day for cuts, sores, red spots, swelling, and blisters.  Moisturize feet and legs daily.  Wear shoes that fit properly and have enough cushioning.  If you have foot problems, report any cuts, sores, or bruises to your health care provider immediately.  Schedule a complete foot exam at least once a year (annually) or more often if you have foot problems. This information is not intended to replace advice given to you by your health care provider. Make sure you discuss any questions you have with your health care provider. Document Revised: 12/02/2018 Document Reviewed: 04/12/2016 Elsevier Patient Education  2020 [employer].

## 2020-03-16 NOTE — Progress Notes (Signed)
Patient has taken medication and patient has eaten today. Patient shares pain has improved but she still feels tingling and numbing in the area. Rotating the foot provides minimal relief.

## 2020-03-17 DIAGNOSIS — R531 Weakness: Secondary | ICD-10-CM | POA: Diagnosis not present

## 2020-03-17 DIAGNOSIS — J9601 Acute respiratory failure with hypoxia: Secondary | ICD-10-CM | POA: Diagnosis not present

## 2020-03-17 DIAGNOSIS — U071 COVID-19: Secondary | ICD-10-CM | POA: Diagnosis not present

## 2020-03-21 ENCOUNTER — Encounter: Payer: Self-pay | Admitting: Orthopaedic Surgery

## 2020-03-21 ENCOUNTER — Ambulatory Visit (INDEPENDENT_AMBULATORY_CARE_PROVIDER_SITE_OTHER): Payer: Medicare Other | Admitting: Orthopaedic Surgery

## 2020-03-21 ENCOUNTER — Ambulatory Visit: Payer: Self-pay

## 2020-03-21 VITALS — Ht 59.0 in | Wt 291.0 lb

## 2020-03-21 DIAGNOSIS — G8929 Other chronic pain: Secondary | ICD-10-CM

## 2020-03-21 DIAGNOSIS — M25511 Pain in right shoulder: Secondary | ICD-10-CM | POA: Diagnosis not present

## 2020-03-21 NOTE — Progress Notes (Signed)
Office Visit Note   Patient: [name]           Date of Birth: 10-Jan-1961           MRN: [id number] Visit Date: 03/21/2020              Requested by: Mayers, Loraine Grip, PA-C [id number] [place],  [place] [address] PCP: Charlott Rakes, MD   Assessment & Plan: Visit Diagnoses:  1. Chronic right shoulder pain     Plan: Impression is chronic right shoulder pain worse to the [facility] joint. At this point, believe her [facility] joint is most symptomatic and we will refer her to Dr. Junius Roads for ultrasound-guided cortisone injection. She will follow up with [place] as needed.  Follow-Up Instructions: Return for f/u with Dr. Junius Roads for right shoulder AC joint injection.   Orders:  Orders Placed This Encounter  Procedures  . XR Shoulder Right   No orders of the defined types were placed in this encounter.     Procedures: No procedures performed   Clinical Data: No additional findings.   Subjective: Chief Complaint  Patient presents with  . Right Shoulder - Pain    HPI patient is a pleasant 59 year old female who comes in today with right shoulder pain and weakness after sustaining a stroke back in 2017 affecting her right side. She is unsure whether she had an actual injury where she injured her right shoulder at that time. Pain she has is to the entire shoulder and is worse with forward flexion and internal rotation type activities. She has been taking Tylenol without relief of symptoms. She has recently been taking prescription pain medicine with mild relief. She does note tingling to bilateral forearms. No previous cortisone injection or surgical intervention.  Review of Systems as detailed in HPI. All others negative.   Objective: Vital Signs: Ht 4\' 11"  (1.499 m)   Wt 291 lb (132 kg)   LMP 02/17/2011 (Exact Date)   BMI 58.77 kg/m   Physical Exam well-developed well-nourished female no acute distress. Alert and oriented x3.  Ortho Exam right shoulder exam shows forward  flexion to about 160 degrees. She can internally rotate to her back pocket. Positive empty can. Significant tenderness to the [facility] joint. Positive cross body adduction. 4 5 strength throughout. She is neurovascular intact distally.  Specialty Comments:  No specialty comments available.  Imaging: XR Shoulder Right  Result Date: 03/21/2020 Degenerative changes to the glenolhumeral joint with possible previous bony bankart    PMFS History: Patient Active Problem List   Diagnosis Date Noted  . Cellulitis of left lower extremity 03/14/2020  . Leukocytosis 03/14/2020  . Rotator cuff impingement syndrome, right 03/09/2020  . Chronic right shoulder pain 03/09/2020  . Acute hypoxemic respiratory failure due to COVID-19 ([place]) 11/10/2019  . Urinary frequency 06/24/2019  . 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 BRBPR (bright red blood per rectum)   . Polyp of cecum   . 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 Empty sella ([place]) 02/24/2018  . Morbid obesity due to excess calories ([place]) 02/05/2018  . 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 History of stroke 07/16/2017  . Headache 04/02/2017  . Seizure disorder ([place]) 10/02/2016  . Obstructive sleep apnea 11/29/2015  . Right shoulder pain 11/29/2015  . Adjustment disorder with depressed mood 10/19/2015  . Nocturnal enuresis   . Hypokalemia   . Anemia, iron deficiency   . Super obese   .  Type 2 diabetes mellitus ([facility])   . Orthostatic hypotension   . Supplemental oxygen dependent   . Diastolic dysfunction   . Tachypnea   . AKI (acute kidney injury) ([facility])   . Acute blood loss anemia   . Lethargy   . Wheezing   . Benign essential HTN   . ICH (intracerebral hemorrhage) ([facility]) 10/06/2015   Past Medical History:  Diagnosis Date  . Headache   . Hypertension   . Iron deficiency anemia   . Obesity   . Seizures ([facility])   . Stroke ([facility])   . Type 2 diabetes mellitus ([facility])      Family History  Problem Relation Age of Onset  . Diabetes Father   . Hypertension Father   . Hypertension Sister   . Stroke Brother     Past Surgical History:  Procedure Laterality Date  . CARDIAC CATHETERIZATION  04/2010   Dr. Sharyn Lull  . COLONOSCOPY WITH PROPOFOL N/A 04/06/2019   Procedure: COLONOSCOPY WITH PROPOFOL;  Surgeon: Napoleon Form, MD;  Location: [facility];  Service: Endoscopy;  Laterality: N/A;  . POLYPECTOMY  04/06/2019   Procedure: POLYPECTOMY;  Surgeon: Napoleon Form, MD;  Location: [facility];  Service: Endoscopy;;   Social History   Occupational History  . Not on file  Tobacco Use  . Smoking status: Never Smoker  . Smokeless tobacco: Never Used  Vaping Use  . Vaping Use: Never used  Substance and Sexual Activity  . Alcohol use: No  . Drug use: No  . Sexual activity: Not Currently

## 2020-03-28 ENCOUNTER — Ambulatory Visit (INDEPENDENT_AMBULATORY_CARE_PROVIDER_SITE_OTHER): Payer: Medicare Other | Admitting: Family Medicine

## 2020-03-28 ENCOUNTER — Ambulatory Visit: Payer: Self-pay

## 2020-03-28 ENCOUNTER — Other Ambulatory Visit: Payer: Self-pay

## 2020-03-28 ENCOUNTER — Encounter: Payer: Self-pay | Admitting: Family Medicine

## 2020-03-28 DIAGNOSIS — G8929 Other chronic pain: Secondary | ICD-10-CM

## 2020-03-28 DIAGNOSIS — M25511 Pain in right shoulder: Secondary | ICD-10-CM | POA: Diagnosis not present

## 2020-03-28 MED ORDER — COLCHICINE 0.6 MG PO CAPS: 1.0000 | ORAL_CAPSULE | Freq: Two times a day (BID) | ORAL | 3 refills | Status: DC | PRN

## 2020-03-28 NOTE — Progress Notes (Signed)
Subjective: Patient is here for ultrasound-guided intra-articular right AC joint injection.  Objective: She is point tender over the [facility] joint and has pain with overhead reach.  Procedure: Ultrasound guided injection is preferred based studies that show increased duration, increased effect, greater accuracy, decreased procedural pain, increased response rate, and decreased cost with ultrasound guided versus blind injection.   Verbal informed consent obtained.  Time-out conducted.  Noted no overlying erythema, induration, or other signs of local infection. Ultrasound-guided right AC joint injection: After sterile prep with Betadine, injected 4 cc 0.25% bupivocaine without epinephrine and 6 mg betamethasone into the [facility] joint without complication.  She had good immediate relief.  In addition she has been having foot pain related to gout.  It came back after steroids ended.  We will try colchicine for this.

## 2020-03-30 ENCOUNTER — Other Ambulatory Visit: Payer: Self-pay | Admitting: Family Medicine

## 2020-03-30 MED ORDER — COLCHICINE 0.6 MG PO CAPS: 1.0000 | ORAL_CAPSULE | Freq: Two times a day (BID) | ORAL | 3 refills | Status: DC | PRN

## 2020-04-03 ENCOUNTER — Encounter: Payer: Medicare Other | Attending: Physical Medicine & Rehabilitation | Admitting: Registered"

## 2020-04-03 ENCOUNTER — Other Ambulatory Visit: Payer: Self-pay

## 2020-04-03 ENCOUNTER — Encounter: Payer: Self-pay | Admitting: Registered"

## 2020-04-03 VITALS — Wt 289.7 lb

## 2020-04-03 DIAGNOSIS — E119 Type 2 diabetes mellitus without complications: Secondary | ICD-10-CM | POA: Insufficient documentation

## 2020-04-03 NOTE — Progress Notes (Signed)
Diabetes Self-Management Education  Visit Type: First/Initial  Appt. Start Time: 1430 Appt. End Time: 1500  04/06/2020  [name], identified by name and date of birth, is a 60 y.o. female with a diagnosis of Diabetes: Type 2.   ASSESSMENT  Weight 289 lb 11.2 oz (131.4 kg), last menstrual period 02/17/2011. Body mass index is 58.51 kg/m.   Patient states she is not getting much movement recently due to gout flair. Pt states the new medication is working slower than how it was treated before. Pt states she spend most of her day in her bedroom so she is close to the bathroom since it is so painful to walk and moves slow. Pt reports her husband was embarrased when she crawled out to car to go to MD appointment, but states it was 2 days before she could walk on her foot.  Pt c/o stiffness, cracking and feels like if things would pop she would feel better.  Pt states she is not checking blood sugar because she can't bring herself to prick her own finger. Pt states she even pulled away when the nurse tried to check her blood sugar. Pt states she does not "own" diabetes. Patient is still drinking regular soda and sometimes sweetened tea because that is what her husband gets.   Pt states she believes metformin caused her to gain weight, 30 lbs since 2017. Pt reports she started several medications after her stroke.   Pt states she loves fruit and vegetables, but when she runs out has to wait until she can go shopping because she likes picking out own fruits.  Pt states she hasn't been to the dentist in a long time because she is afraid of the equipment and sounds. Pt states she has found someone she trusts and will reach out to make an appointment.   Diabetes Self-Management Education - 04/03/20 1423      Visit Information   Visit Type First/Initial      Initial Visit   Diabetes Type Type 2    Are you currently following a meal plan? No    Are you taking your medications as  prescribed? Yes      Health Coping   How would you rate your overall health? Other (comment)   "not poor because I can still get around"     Psychosocial Assessment   Patient Belief/Attitude about Diabetes Denial   not claiming it   How often do you need to have someone help you when you read instructions, pamphlets, or other written materials from your doctor or pharmacy? 1 - Never    What is the last grade level you completed in school? 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 0 times/day (not testing)    Have you had a dilated eye exam in the past 12 months? No   2 yrs ago   Have you had a dental exam in the past 12 months? No   fear of dentists and equipment   Are you checking your feet? Yes    How many days per week are you checking your feet? --   can't see them, but feels them     Dietary Intake   Breakfast sausage, toast, hot tea 1 T sugar    Lunch none    Dinner chicken wings, 1  corn on the cob, green beans, mtn dew 16 oz    Snack (evening) 3 iced oatmeal cookies    Beverage(s) water, tea, mtn dew      Exercise   Exercise Type ADL's      Patient Education   Previous Diabetes Education No    Nutrition management  Role of diet in the treatment of diabetes and the relationship between the three main macronutrients and blood glucose level    Physical activity and exercise  Role of exercise on diabetes management, blood pressure control and cardiac health.    Medications Reviewed patients medication for diabetes, action, purpose, timing of dose and side effects.    Chronic complications Dental care      Outcomes   Expected Outcomes Demonstrated interest in learning. Expect positive outcomes    Future DMSE 2 months    Program Status Not Completed           Individualized Plan for Diabetes Self-Management Training:   Learning Objective:   Patient will have a greater understanding of diabetes self-management. Patient education plan is to attend individual and/or group sessions per assessed needs and concerns.    Patient Instructions  Eye exams should be yearly - be sure to include diabetes on the intake form Make dentist appointment as soon as you can.  A lot of fresh fruits and vegetables you can freeze.   Consider having your tea without sugar Consider having diet soda instead of regular  To help reduce inflammation: Whole grain basamati rice and whole grain bread, pasta, etc. Look for the "Whole "Grain. Fruits and Vegetables  Find ways to increase physical activity when you are not able to walk. 4 times walk through your house.  Instead of snacking on cookies snack on fruit and keep your servings size about a small bowl   Expected Outcomes:  Demonstrated interest in learning. Expect positive outcomes  Education material provided: none  If problems or questions, patient to contact team via:  Phone and MyChart  Future DSME appointment: 2 months

## 2020-04-03 NOTE — Patient Instructions (Addendum)
Eye exams should be yearly - be sure to include diabetes on the intake form Make dentist appointment as soon as you can.  A lot of fresh fruits and vegetables you can freeze.   Consider having your tea without sugar Consider having diet soda instead of regular  To help reduce inflammation: Whole grain basamati rice and whole grain bread, pasta, etc. Look for the "Whole "Grain. Fruits and Vegetables  Find ways to increase physical activity when you are not able to walk. 4 times walk through your house.  Instead of snacking on cookies snack on fruit and keep your servings size about a small bowl

## 2020-04-04 ENCOUNTER — Ambulatory Visit: Payer: Medicare Other | Admitting: Physical Medicine & Rehabilitation

## 2020-04-11 ENCOUNTER — Other Ambulatory Visit: Payer: Self-pay

## 2020-04-11 ENCOUNTER — Ambulatory Visit: Payer: Medicare Other | Attending: Family Medicine | Admitting: Family Medicine

## 2020-04-11 DIAGNOSIS — M79605 Pain in left leg: Secondary | ICD-10-CM | POA: Diagnosis not present

## 2020-04-11 DIAGNOSIS — Z1231 Encounter for screening mammogram for malignant neoplasm of breast: Secondary | ICD-10-CM

## 2020-04-11 DIAGNOSIS — M1A0720 Idiopathic chronic gout, left ankle and foot, without tophus (tophi): Secondary | ICD-10-CM | POA: Diagnosis not present

## 2020-04-11 NOTE — Progress Notes (Signed)
Virtual Visit via Telephone Note  I connected with [name], on 04/11/2020 at 10:15 AM by telephone due to the COVID-19 pandemic and verified that I am speaking with the correct person using two identifiers.   Consent: I discussed the limitations, risks, security and privacy concerns of performing an evaluation and management service by telephone and the availability of in person appointments. I also discussed with the patient that there may be a patient responsible charge related to this service. The patient expressed understanding and agreed to proceed.   Location of Patient: Out and about  Location of Provider: Home   Persons participating in Telemedicine visit: Hunter Pinkard [place]-CMA Dr. 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  She has had Gout flares in her left foot, the initial episode started 3 weeks ago then she had a repeat episode. Right now pain is 0/10 in foot but she has pain in her calf and thigh. She has swelling in her calf but no erythema or fever. Currently on Colchicine.  She is concerned about being unable to loose weight and her ability to exercise is limited by her right sided weakness. Currently seeing a Nutritionist. Her chronic medical conditions are stable and she is good on all her refills Past Medical History:  Diagnosis Date  . Headache   . Hypertension   . Iron deficiency anemia   . Obesity   . Seizures ([place])   . Stroke ([place])   . Type 2 diabetes mellitus (HCC)    Allergies  Allergen Reactions  . Bee Venom Swelling    Facial swelling  . Eggs Or Egg-Derived Products Nausea And Vomiting and Swelling    Mouth swelled  . Gabapentin Other (See  Comments)    Jittery feeling, dyspnea, crawling sensation  . Gadolinium Derivatives Itching    Immediately after gad injection pt. complained of tongue numbness and lower lip/ then had rt side of body tingling/ pt. Was assessed by Dr. Jeralyn Ruths before leaving/ no medications were administered/    Current Outpatient Medications on File Prior to Visit  Medication Sig Dispense Refill  . Accu-Chek FastClix Lancets MISC Check blood sugar up to 3 times daily. E11.59 102 each 6  . acetaminophen (TYLENOL) 500 MG tablet Take 500-1,000 mg by mouth every 6 (six) hours as needed for mild pain or headache.     Marland Kitchen atorvastatin (LIPITOR) 20 MG tablet Take 1 tablet (20 mg total) by mouth daily. 30 tablet 6  . Blood Glucose Monitoring Suppl (ACCU-CHEK GUIDE ME) w/Device KIT 1 kit by Does not apply route daily. Check blood sugar up to 3 times daily. E11.59 1 kit 0  . cetirizine (ZYRTEC) 10 MG tablet Take 10 mg by mouth daily as needed for allergies.     . Colchicine (MITIGARE) 0.6 MG CAPS Take 1 capsule by mouth 2 (two) times daily as needed. 60 capsule 3  . diclofenac (VOLTAREN) 75 MG EC tablet Take 1 tablet (75 mg total) by mouth 2 (two) times daily. 14 tablet 0  . diclofenac Sodium (VOLTAREN) 1 % GEL Apply 1 application topically 4 (four) times daily as needed (pain.). OTC    . diphenhydrAMINE (BENADRYL) 25 MG tablet Take 25 mg by mouth at bedtime.    . fluticasone (FLONASE)  50 MCG/ACT nasal spray Place 1 spray into both nostrils daily as needed for allergies or rhinitis. 16 g 1  . glipiZIDE (GLUCOTROL) 5 MG tablet Take 0.5 tablets (2.5 mg total) by mouth 2 (two) times daily before a meal. (Patient taking differently: Take 2.5 mg by mouth 2 (two) times daily before a meal.) 30 tablet 6  . glucose blood (ACCU-CHEK GUIDE) test strip Check blood sugar up to 3 times daily. E11.59 100 each 6  . hydrocortisone (ANUSOL-HC) 25 MG suppository Place 1 suppository (25 mg total) rectally 2 (two) times daily. 12 suppository 0   . hydroxypropyl methylcellulose / hypromellose (ISOPTO TEARS / GONIOVISC) 2.5 % ophthalmic solution Place 2 drops into both eyes as needed (irritation). (Patient taking differently: Place 2 drops into both eyes 4 (four) times daily as needed (irritation).) 15 mL 1  . levETIRAcetam (KEPPRA) 500 MG tablet TAKE 1 & 1/2 TABLETS BY MOUTH TWICE DAILY. (Patient taking differently: Take 750 mg by mouth 2 (two) times daily.) 90 tablet 8  . lisinopril (ZESTRIL) 10 MG tablet Take 1 tablet (10 mg total) by mouth daily. 30 tablet 6  . meclizine (ANTIVERT) 25 MG tablet TAKE 1 TABLET (25 MG TOTAL) BY MOUTH 2 (TWO) TIMES DAILY AS NEEDED FOR DIZZINESS. MUST HAVE OFFICE VISIT FOR REFILLS 60 tablet 0  . metFORMIN (GLUCOPHAGE XR) 500 MG 24 hr tablet Take 2 tablets (1,000 mg total) by mouth 2 (two) times daily. 120 tablet 6  . methocarbamol (ROBAXIN-750) 750 MG tablet Take 1 tablet (750 mg total) by mouth in the morning and at bedtime. 60 tablet 2  . metoprolol tartrate (LOPRESSOR) 100 MG tablet Take 1 tablet (100 mg total) by mouth 2 (two) times daily. 60 tablet 6  . lidocaine (LIDODERM) 5 % Place 1 patch onto the skin daily. Remove & Discard patch within 12 hours or as directed by MD (Patient not taking: Reported on 04/11/2020) 30 patch 0  . pantoprazole (PROTONIX) 40 MG tablet Take 1 tablet (40 mg total) by mouth at bedtime for 10 days. 10 tablet 0  . predniSONE (DELTASONE) 20 MG tablet Take 3 tablets once daily for 3 days followed by 2 tablets once daily for 3 days followed by 1 tablet once daily for 3 days and then stop (Patient not taking: No sig reported) 18 tablet 0  . tiZANidine (ZANAFLEX) 2 MG tablet Take 1 tablet (2 mg total) by mouth every 8 (eight) hours as needed for muscle spasms. (Patient not taking: No sig reported) 90 tablet 0   No current facility-administered medications on file prior to visit.    Observations/Objective: Awake, alert, oriented x3 Not in acute distress Homan's sign performed by  patient is negative - instruction provided over the phone on how to perform this.  Lab Results  Component Value Date   HGBA1C 6.4 (H) 11/10/2019    Assessment and Plan: 1. Idiopathic chronic gout of left foot without tophus Acute flare has resolved Currently on Colchicine  2. Pain of left lower extremity Negative Homan's sign Low suspicion for cellulitis Could be muscle spas If symptoms persist, consider LE doppler to r/o DVT  3. Morbid obesity due to excess calories (HCC) Unfortunately slight R hemiparesis limits ability to exercise Currently seeing a Nutritionist - Amb Referral to Bariatric Surgery  4. Encounter for screening mammogram for malignant neoplasm of breast - MM 3D SCREEN BREAST BILATERAL; Future   Follow Up Instructions: 2 months - chronic disease management and labs   I discussed the  assessment and treatment plan with the patient. The patient was provided an opportunity to ask questions and all were answered. The patient agreed with the plan and demonstrated an understanding of the instructions.   The patient was advised to call back or seek an in-person evaluation if the symptoms worsen or if the condition fails to improve as anticipated.     I provided 18 minutes total of non-face-to-face time during this encounter including median intraservice time, reviewing previous notes, investigations, ordering medications, medical decision making, coordinating care and patient verbalized understanding at the end of the visit.     Charlott Rakes, MD, FAAFP. [facility] and [place], [place]   04/11/2020, 10:15 AM

## 2020-04-11 NOTE — Progress Notes (Signed)
Has had 2 gout flares in her left foot Having pain in right shoulder.

## 2020-04-17 DIAGNOSIS — U071 COVID-19: Secondary | ICD-10-CM | POA: Diagnosis not present

## 2020-04-17 DIAGNOSIS — J9601 Acute respiratory failure with hypoxia: Secondary | ICD-10-CM | POA: Diagnosis not present

## 2020-04-17 DIAGNOSIS — R531 Weakness: Secondary | ICD-10-CM | POA: Diagnosis not present

## 2020-05-01 ENCOUNTER — Other Ambulatory Visit: Payer: Self-pay

## 2020-05-01 ENCOUNTER — Encounter: Payer: Medicare Other | Attending: Physical Medicine & Rehabilitation | Admitting: Registered"

## 2020-05-01 ENCOUNTER — Encounter: Payer: Self-pay | Admitting: Registered"

## 2020-05-01 DIAGNOSIS — E119 Type 2 diabetes mellitus without complications: Secondary | ICD-10-CM

## 2020-05-01 NOTE — Progress Notes (Signed)
Diabetes Self-Management Education  Visit Type: Follow-up  Appt. Start Time: 1410 Appt. End Time: 6440  05/01/2020  [name], identified by name and date of birth, is a 60 y.o. female with a diagnosis of Diabetes: Type 2.   ASSESSMENT  Last menstrual period 02/17/2011. There is no height or weight on file to calculate BMI.   Pt states metformin makes her nibble on food and gain weight. Pt states since she cut back how much metformin she takes her A1c dropped to 5.3%; Pt reports her prescription is for 1000 mg bid, but she takes 500 bid every to or 3 days.  Pt states she has been working on portion controlm not putting too much on plate and paying close attention to when full and not worrying about cleaning plate as she was taught when young.   Patient states she is working in including more fruits had some apples. Pt reports she keeps in the refrigerator and forgets. Pt states her husband buys snacks and soda so she can't keep it out of the house. Pt states she was given pamphlets on nutrition, diabetes, cholesterol, losing weight, and a chart.  Pt states she is also is making changes in how she is cooking. Pt states she asked husband to only use a little amount of salt and she doesn't add any more at the table. Working on using other herbs for seasoning.  Patient states she forgot about goal to make dentist appointment, but will make an effort to get that done.   Diabetes Self-Management Education - 05/01/20 1410      Visit Information   Visit Type Follow-up      Initial Visit   Diabetes Type Type 2    Are you taking your medications as prescribed? No   metformin 500 bid about every other day     Complications   Last HgB A1C per patient/outside source 5.3 %   yesterday per pt said insurance takes 1x/year   How often do you check your blood sugar? --   does not prick herself     Dietary Intake   Breakfast bacon sandwich    Dinner salad OR shrimps soup, jambalia rice     Snack (evening) popcorn    Beverage(s) water with meals, tea sometimes, mtn dew, 1/2 dr pepper      Exercise   Exercise Type ADL's      Patient Education   Medications Other (comment)   grapefruit interaction   Chronic complications Dental care;Retinopathy and reason for yearly dilated eye exams      Outcomes   Expected Outcomes Demonstrated interest in learning. Expect positive outcomes    Future DMSE 4-6 wks    Program Status Not Completed      Subsequent Visit   Since your last visit have you continued or begun to take your medications as prescribed? Yes    Since your last visit have you had your blood pressure checked? Yes    Is your most recent blood pressure lower, unchanged, or higher since your last visit? Lower    Since your last visit, are you checking your blood glucose at least once a day? No           Individualized Plan for Diabetes Self-Management Training:   Learning Objective:  Patient will have a greater understanding of diabetes self-management. Patient education plan is to attend individual and/or group sessions per assessed needs and concerns.    Patient Instructions  Eye exams should be  yearly - be sure to include diabetes on the intake form Make dentist appointment as soon as you can.  Continue walking in your house for exercise.  Ask your pharmacist about drinking grapefruit juice. According to a quick Internet search it looks like atorvastatin may be the concern, but only when drinking grapefruit juice large quantities, but double check with your doctor or pharmacist.  Consider only small quantities of the orange juice to prevent high blood sugar.   Expected Outcomes:  Demonstrated interest in learning. Expect positive outcomes  Education material provided: none  If problems or questions, patient to contact team via:  Phone  Future DSME appointment: 4-6 wks

## 2020-05-01 NOTE — Patient Instructions (Addendum)
Eye exams should be yearly - be sure to include diabetes on the intake form Make dentist appointment as soon as you can.  Continue walking in your house for exercise.  Ask your pharmacist about drinking grapefruit juice. According to a quick Internet search it looks like atorvastatin may be the concern, but only when drinking grapefruit juice large quantities, but double check with your doctor or pharmacist.  Consider only small quantities of the orange juice to prevent high blood sugar.

## 2020-05-12 MED FILL — ATORVASTATIN CALCIUM 20 MG: 20 | 90 days supply | Qty: 90 | Fill #2

## 2020-05-12 MED FILL — LISINOPRIL 10 MG TABS: 10 | 90 days supply | Qty: 90 | Fill #2

## 2020-05-12 MED FILL — METOPROLOL TARTRATE 100 MG: 100 | 90 days supply | Qty: 180 | Fill #2

## 2020-05-12 MED FILL — METHOCARBAMOL 750 MG TABS: 750 | 30 days supply | Qty: 60 | Fill #1

## 2020-05-12 MED FILL — glipiZIDE 5 MG TABS: 5 | 90 days supply | Qty: 90 | Fill #1

## 2020-05-29 ENCOUNTER — Encounter: Payer: Medicare Other | Admitting: Registered"

## 2020-06-12 ENCOUNTER — Encounter: Payer: Self-pay | Admitting: Family Medicine

## 2020-06-12 ENCOUNTER — Other Ambulatory Visit: Payer: Self-pay | Admitting: Physical Medicine & Rehabilitation

## 2020-06-12 ENCOUNTER — Encounter: Payer: Self-pay | Admitting: Physical Medicine & Rehabilitation

## 2020-06-12 ENCOUNTER — Ambulatory Visit: Payer: Medicare Other | Attending: Family Medicine | Admitting: Family Medicine

## 2020-06-12 ENCOUNTER — Encounter: Payer: Medicare Other | Attending: Physical Medicine & Rehabilitation | Admitting: Physical Medicine & Rehabilitation

## 2020-06-12 ENCOUNTER — Other Ambulatory Visit: Payer: Self-pay

## 2020-06-12 VITALS — BP 176/83 | HR 68 | Temp 98.9°F | Ht 59.0 in | Wt 296.6 lb

## 2020-06-12 DIAGNOSIS — M25561 Pain in right knee: Secondary | ICD-10-CM

## 2020-06-12 DIAGNOSIS — I611 Nontraumatic intracerebral hemorrhage in hemisphere, cortical: Secondary | ICD-10-CM | POA: Diagnosis not present

## 2020-06-12 DIAGNOSIS — I10 Essential (primary) hypertension: Secondary | ICD-10-CM | POA: Diagnosis not present

## 2020-06-12 DIAGNOSIS — R42 Dizziness and giddiness: Secondary | ICD-10-CM

## 2020-06-12 DIAGNOSIS — E785 Hyperlipidemia, unspecified: Secondary | ICD-10-CM

## 2020-06-12 DIAGNOSIS — M25511 Pain in right shoulder: Secondary | ICD-10-CM | POA: Insufficient documentation

## 2020-06-12 DIAGNOSIS — G8929 Other chronic pain: Secondary | ICD-10-CM | POA: Insufficient documentation

## 2020-06-12 DIAGNOSIS — M7541 Impingement syndrome of right shoulder: Secondary | ICD-10-CM | POA: Diagnosis not present

## 2020-06-12 DIAGNOSIS — I69398 Other sequelae of cerebral infarction: Secondary | ICD-10-CM

## 2020-06-12 DIAGNOSIS — R269 Unspecified abnormalities of gait and mobility: Secondary | ICD-10-CM

## 2020-06-12 DIAGNOSIS — J302 Other seasonal allergic rhinitis: Secondary | ICD-10-CM

## 2020-06-12 DIAGNOSIS — M25562 Pain in left knee: Secondary | ICD-10-CM

## 2020-06-12 DIAGNOSIS — E1159 Type 2 diabetes mellitus with other circulatory complications: Secondary | ICD-10-CM

## 2020-06-12 MED ORDER — LISINOPRIL 10 MG PO TABS
10.0000 mg | ORAL_TABLET | Freq: Every day | ORAL | 6 refills | Status: DC
Start: 2020-06-12 — End: 2020-06-12

## 2020-06-12 MED ORDER — MECLIZINE HCL 25 MG PO TABS: 25.0000 mg | ORAL_TABLET | Freq: Two times a day (BID) | ORAL | 2 refills | Status: DC | PRN

## 2020-06-12 MED ORDER — FLUTICASONE PROPIONATE 50 MCG/ACT NA SUSP: 1.0000 | Freq: Every day | NASAL | 1 refills | Status: DC | PRN

## 2020-06-12 MED ORDER — ATORVASTATIN CALCIUM 20 MG PO TABS
20.0000 mg | ORAL_TABLET | Freq: Every day | ORAL | 6 refills | Status: DC
Start: 2020-06-12 — End: 2020-06-12

## 2020-06-12 MED ORDER — METOPROLOL TARTRATE 100 MG PO TABS: 100.0000 mg | ORAL_TABLET | Freq: Two times a day (BID) | ORAL | 6 refills | Status: DC

## 2020-06-12 MED ORDER — METHOCARBAMOL 750 MG PO TABS: 750.0000 mg | ORAL_TABLET | Freq: Two times a day (BID) | ORAL | 2 refills | Status: DC

## 2020-06-12 MED ORDER — METFORMIN HCL ER 500 MG PO TB24: 1000.0000 mg | ORAL_TABLET | Freq: Two times a day (BID) | ORAL | 6 refills | Status: DC

## 2020-06-12 MED ORDER — DICLOFENAC SODIUM 75 MG PO TBEC: 75.0000 mg | DELAYED_RELEASE_TABLET | Freq: Two times a day (BID) | ORAL | 1 refills | Status: DC

## 2020-06-12 MED FILL — DICLOFENAC SOD EC 75 MG TAB: 75 | 30 days supply | Qty: 60 | Fill #0

## 2020-06-12 MED FILL — MECLIZINE 25 MG TABLET: 25 | 30 days supply | Qty: 60 | Fill #0

## 2020-06-12 MED FILL — FLUTICASONE PROP 50 MCG SPR: 50 | 30 days supply | Qty: 16 | Fill #0

## 2020-06-12 MED FILL — metFORMIN HCL ER 500 MG TB2: 500 | 30 days supply | Qty: 120 | Fill #0

## 2020-06-12 MED FILL — METHOCARBAMOL 750 MG TABS: 750 | 30 days supply | Qty: 60 | Fill #0

## 2020-06-12 NOTE — Progress Notes (Signed)
Subjective:    Patient ID: [name], female    DOB: 1960/10/14, 60 y.o.   MRN: [id number]  HPI  RH-female with history of morbid obesity, HTN, iron deficiency anemia who presents for follow up after right temporal intracranial hemorrhage.   Last clinic visit 03/09/20. Since that time, pt states she did not follow up with [facility]. She denies weight loss. She is using Robaxin ~3/week. She uses Voltaren on knees. Denies falls. She still did not obtain xray of shoulder. She ended up having a steroid injection in her shoulder and notes improvement, but notes intermittent shooting pain.  BP is elevated, but is unsure what it is at baseline. She has not followed up with Endo.  She continues to have urinary freq, particularly at night, but states she did not decrease fluid intake at night.   Pain Inventory Average Pain  4 Pain Right Now 5 My pain is intermittent, constant, sharp and aching  In the last 24 hours, has pain interfered with the following? General activity 6 Relation with others 8 Enjoyment of life 8 What TIME of day is your pain at its worst? Anytime Sleep (in general) Fair  Pain is worse with: walking and some activites Pain improves with: rest and medication Relief from Meds: good    Family History  Problem Relation Age of Onset  . Diabetes Father   . Hypertension Father   . Hypertension Sister   . Stroke Brother    Social History   Socioeconomic History  . Marital status: Married    Spouse name: Not on file  . Number of children: Not on file  . Years of education: Not on file  . Highest education level: Not on file  Occupational History  . Not on file  Tobacco Use  . Smoking status: Never Smoker  . Smokeless tobacco: Never Used  Vaping Use  . Vaping Use: Never used  Substance and Sexual Activity  . Alcohol use: No  . Drug use: No  . Sexual activity: Not Currently  Other Topics Concern  . Not on file  Social History Narrative  . Not on file    Social Determinants of Health   Financial Resource Strain: Not on file  Food Insecurity: Not on file  Transportation Needs: Not on file  Physical Activity: Not on file  Stress: Not on file  Social Connections: Not on file   Past Surgical History:  Procedure Laterality Date  . CARDIAC CATHETERIZATION  04/2010   Dr. Terrence Dupont  . COLONOSCOPY WITH PROPOFOL N/A 04/06/2019   Procedure: COLONOSCOPY WITH PROPOFOL;  Surgeon: Mauri Pole, MD;  Location: [facility];  Service: Endoscopy;  Laterality: N/A;  . POLYPECTOMY  04/06/2019   Procedure: POLYPECTOMY;  Surgeon: Mauri Pole, MD;  Location: [facility];  Service: Endoscopy;;   Past Medical History:  Diagnosis Date  . Headache   . Hypertension   . Iron deficiency anemia   . Obesity   . Seizures ([place])   . Stroke ([place])   . Type 2 diabetes mellitus (HCC)    BP (!) 176/83   Pulse 68   Temp 98.9 F (37.2 C)   Ht 4\' 11"  (1.499 m)   Wt 296 lb 9.6 oz (134.5 kg)   LMP 02/17/2011 (Exact Date)   SpO2 95%   BMI 59.91 kg/m   Opioid Risk Score:   Fall Risk Score:  `1  Depression screen [facility] 2/9  Depression screen [facility] 2/9 06/12/2020 03/13/2020 08/02/2019 02/15/2019 10/20/2018  06/05/2018 04/22/2018  Decreased Interest 0 2 3 0 0 1 2  Down, Depressed, Hopeless 0 2 3 0 0 1 2  PHQ - 2 Score 0 4 6 0 0 2 4  Altered sleeping - 2 - 0 2 - 2  Tired, decreased energy - 1 - 0 0 - 2  Change in appetite - 2 - 0 2 - 2  Feeling bad or failure about yourself  - 0 - 0 0 - 2  Trouble concentrating - 0 - 0 0 - 0  Moving slowly or fidgety/restless - 0 - 0 2 - 2  Suicidal thoughts - 0 - 0 0 - 0  PHQ-9 Score - 9 - 0 6 - 14  Some recent data might be hidden    Review of Systems  Constitutional: Negative.        Bladder control problems   HENT: Negative.   Eyes: Negative.   Respiratory: Positive for apnea, cough, shortness of breath and wheezing.   Cardiovascular: Positive for leg swelling.  Gastrointestinal: Positive for diarrhea.   Endocrine: Negative.   Genitourinary: Positive for frequency.  Musculoskeletal: Positive for arthralgias, back pain and gait problem.       Spasms   Skin: Negative.   Neurological: Positive for weakness.       Tingling    Hematological: Negative.   Psychiatric/Behavioral: Positive for confusion, dysphoric mood and sleep disturbance.      Objective:   Physical Exam  Constitutional: No distress . Vital signs reviewed. HENT: Normocephalic.  Atraumatic. Eyes: EOMI. No discharge. Cardiovascular: No JVD.   Respiratory: Normal effort.  No stridor.   GI: Non-distended.   Skin: Warm and dry.  Intact. Psych: Normal mood.  Normal behavior. Musc: No edema in extremities.   Gait: Slow cadence No TTP right AC joint. Neurological: Alert Motor: RUE: 4+/5 throughout, some pain inhibition. RLE: 4/5 HF, 4/5 distally    Assessment & Plan:  RH-female with history of morbid obesity, HTN, iron deficiency anemia who presents for follow up after right temporal intracranial hemorrhage.  1.S/p right temporal intracranial hemorrhage with dysesthesias  Side effects with gabapentin             Aquatic therapy denied by insurance, encouraged follow up at [facility], pt states she does not like the water.  States no longer ambulating with daughter  MRI previously reviewed - stable infarct with empty sella persistent  Continue meds  Stopped taking Cymbalta, will not restart at present  2. Depression     Reaction to Prozaac  Pt states she is managing with her chaplain and scriptures  Stopped taking Cymbalta  3. Seizures             Continue meds, believes she is taking, but not sure  4. Morbid obesity  Continue follow up with dietitian             Continue to encourage weight loss again  Patient to follow up with PCP bariatric surgery - states she is working on going through a checklist  5. Muscle spasms             Encouraged ROM and stretching  Continue Robaxin 750 BID PRN  6.  Abnormality of gait - post stroke             Continue cane  Therapies completed, cont HEP  Discussed environmental modifications  7. Right shoulder pain/AC - Rotator cuff tendinopathy  Xrays reviewed with some degenerative change right shoulder  States she had a steroid injection with benefit  Encouraged Voltaren gel  Improving  8. Right leg pain  See #5, #7  9. Falls  Environmental factors, encouraged modifications.  Encouraged pt to push bed against wall or place furniture against bed to limit chances of rolling onto floor  See #6  10. 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 Urinary frequency  Mainly nocturnal  Encouraged decrease in nighttime intake and increase daytime fluid intake, reminded again x3  Patient to follow up with PCP  Patient would like to follow up in 3 months

## 2020-06-12 NOTE — Progress Notes (Signed)
Virtual Visit via Telephone Note  I connected with [name], on 06/12/2020 at 10:43 AM by telephone due to the COVID-19 pandemic and verified that I am speaking with the correct person using two identifiers.   Consent: I discussed the limitations, risks, security and privacy concerns of performing an evaluation and management service by telephone and the availability of in person appointments. I also discussed with the patient that there may be a patient responsible charge related to this service. The patient expressed understanding and agreed to proceed.   Location of Patient: Home  Location of Provider: Clinic   Persons participating in Telemedicine visit: [name] Dr. 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   She states a home nurse drew her A1c and it was 9.0.  She does not check her blood sugars regularly and is unable to provide blood sugar readings.  Endorses compliance with both Metformin and glipizide and denies hypoglycemic episodes.  She denies numbness in extremities or blurry vision. She complains of pains in her knees which is intermittent but no swelling. She had a headache yesterday and today which occurs in her frontal region and she has had runny nose, tearing from her eyes and itchy eyes.  Denies presence of fevers or myalgias.  Denies presence of nausea or vomiting.  Past Medical History:  Diagnosis Date  . Headache   . Hypertension   . Iron deficiency anemia   . Obesity   . Seizures ([place])   . Stroke ([place])   . Type 2 diabetes mellitus (HCC)    Allergies  Allergen Reactions  . Bee Venom Swelling    Facial swelling  . Eggs Or Egg-Derived Products Nausea And  Vomiting and Swelling    Mouth swelled  . Gabapentin Other (See Comments)    Jittery feeling, dyspnea, crawling sensation  . Gadolinium Derivatives Itching    Immediately after gad injection pt. complained of tongue numbness and lower lip/ then had rt side of body tingling/ pt. Was assessed by Dr. Jeralyn Ruths before leaving/ no medications were administered/    Current Outpatient Medications on File Prior to Visit  Medication Sig Dispense Refill  . Accu-Chek FastClix Lancets MISC Check blood sugar up to 3 times daily. E11.59 102 each 6  . acetaminophen (TYLENOL) 500 MG tablet Take 500-1,000 mg by mouth every 6 (six) hours as needed for mild pain or headache.     Marland Kitchen atorvastatin (LIPITOR) 20 MG tablet Take 1 tablet (20 mg total) by mouth daily. 30 tablet 6  . Blood Glucose Monitoring Suppl (ACCU-CHEK GUIDE ME) w/Device KIT 1 kit by Does not apply route daily. Check blood sugar up to 3 times daily. E11.59 1 kit 0  . cetirizine (ZYRTEC) 10 MG tablet Take 10 mg by mouth daily as needed for allergies.     . Colchicine (MITIGARE) 0.6 MG CAPS Take 1 capsule by mouth 2 (two) times daily as needed. 60 capsule 3  . colchicine 0.6 MG tablet Take by mouth 2 (two) times daily as needed.    . diclofenac (VOLTAREN) 75 MG EC tablet Take 1 tablet (75 mg total) by mouth 2 (two) times daily. 14 tablet 0  . diclofenac Sodium (VOLTAREN) 1 % GEL Apply 1 application topically 4 (four) times daily  as needed (pain.). OTC    . diphenhydrAMINE (BENADRYL) 25 MG tablet Take 25 mg by mouth at bedtime.    . fluticasone (FLONASE) 50 MCG/ACT nasal spray Place 1 spray into both nostrils daily as needed for allergies or rhinitis. 16 g 1  . glipiZIDE (GLUCOTROL) 5 MG tablet Take 0.5 tablets (2.5 mg total) by mouth 2 (two) times daily before a meal. (Patient taking differently: Take 2.5 mg by mouth 2 (two) times daily before a meal.) 30 tablet 6  . glucose blood (ACCU-CHEK GUIDE) test strip Check blood sugar up to 3 times daily. E11.59  100 each 6  . hydrocortisone (ANUSOL-HC) 25 MG suppository Place 1 suppository (25 mg total) rectally 2 (two) times daily. 12 suppository 0  . hydroxypropyl methylcellulose / hypromellose (ISOPTO TEARS / GONIOVISC) 2.5 % ophthalmic solution Place 2 drops into both eyes as needed (irritation). (Patient taking differently: Place 2 drops into both eyes 4 (four) times daily as needed (irritation).) 15 mL 1  . levETIRAcetam (KEPPRA) 500 MG tablet TAKE 1 & 1/2 TABLETS BY MOUTH TWICE DAILY. (Patient taking differently: Take 750 mg by mouth 2 (two) times daily.) 90 tablet 8  . lidocaine (LIDODERM) 5 % Place 1 patch onto the skin daily. Remove & Discard patch within 12 hours or as directed by MD (Patient not taking: Reported on 04/11/2020) 30 patch 0  . lisinopril (ZESTRIL) 10 MG tablet Take 1 tablet (10 mg total) by mouth daily. 30 tablet 6  . meclizine (ANTIVERT) 25 MG tablet TAKE 1 TABLET (25 MG TOTAL) BY MOUTH 2 (TWO) TIMES DAILY AS NEEDED FOR DIZZINESS. MUST HAVE OFFICE VISIT FOR REFILLS 60 tablet 0  . metFORMIN (GLUCOPHAGE XR) 500 MG 24 hr tablet Take 2 tablets (1,000 mg total) by mouth 2 (two) times daily. 120 tablet 6  . methocarbamol (ROBAXIN-750) 750 MG tablet Take 1 tablet (750 mg total) by mouth in the morning and at bedtime. 60 tablet 2  . metoprolol tartrate (LOPRESSOR) 100 MG tablet Take 1 tablet (100 mg total) by mouth 2 (two) times daily. 60 tablet 6  . pantoprazole (PROTONIX) 40 MG tablet Take 1 tablet (40 mg total) by mouth at bedtime for 10 days. 10 tablet 0  . predniSONE (DELTASONE) 20 MG tablet Take 3 tablets once daily for 3 days followed by 2 tablets once daily for 3 days followed by 1 tablet once daily for 3 days and then stop (Patient not taking: No sig reported) 18 tablet 0  . tiZANidine (ZANAFLEX) 2 MG tablet Take 1 tablet (2 mg total) by mouth every 8 (eight) hours as needed for muscle spasms. (Patient not taking: No sig reported) 90 tablet 0   No current facility-administered  medications on file prior to visit.    ROS: See HPI  Observations/Objective: Awake, alert, oriented x3 Not in acute distress Speaks in full sentences Normal mood  CMP Latest Ref Rng & Units 03/12/2020 11/16/2019 11/15/2019  Glucose 70 - 99 mg/dL 111(H) 158(H) 225(H)  BUN 6 - 20 mg/dL 15 41(H) 34(H)  Creatinine 0.44 - 1.00 mg/dL 1.09(H) 1.06(H) 0.96  Sodium 135 - 145 mmol/L 138 137 143  Potassium 3.5 - 5.1 mmol/L 3.2(L) 3.8 3.9  Chloride 98 - 111 mmol/L 97(L) 95(L) 96(L)  CO2 22 - 32 mmol/L 29 30 33(H)  Calcium 8.9 - 10.3 mg/dL 9.1 8.9 9.1  Total Protein 6.5 - 8.1 g/dL 8.9(H) - 8.4(H)  Total Bilirubin 0.3 - 1.2 mg/dL 0.5 - 0.9  Alkaline Phos 38 -  126 U/L 61 - 45  AST 15 - 41 U/L 14(L) - 34  ALT 0 - 44 U/L 12 - 34    Lipid Panel     Component Value Date/Time   CHOL 74 (L) 10/23/2018 0936   TRIG 63 11/10/2019 1251   HDL 36 (L) 10/23/2018 0936   CHOLHDL 2.1 10/23/2018 0936   CHOLHDL 2.7 10/08/2015 0541   VLDL 12 10/08/2015 0541   LDLCALC 24 10/23/2018 0936   LABVLDL 14 10/23/2018 0936    Lab Results  Component Value Date   HGBA1C 6.4 (H) 11/10/2019    Assessment and Plan: 1. Nontraumatic cortical hemorrhage of cerebral hemisphere, unspecified laterality (HCC) Slight residual weakness on the right Risk factor modification - atorvastatin (LIPITOR) 20 MG tablet; Take 1 tablet (20 mg total) by mouth daily.  Dispense: 30 tablet; Refill: 6  2. Hyperlipidemia, unspecified hyperlipidemia type Controlled Low-cholesterol diet - atorvastatin (LIPITOR) 20 MG tablet; Take 1 tablet (20 mg total) by mouth daily.  Dispense: 30 tablet; Refill: 6  3. Benign essential HTN Stable Counseled on blood pressure goal of less than 130/80, low-sodium, DASH diet, medication compliance, 150 minutes of moderate intensity exercise per week. Discussed medication compliance, adverse effects. - lisinopril (ZESTRIL) 10 MG tablet; Take 1 tablet (10 mg total) by mouth daily.  Dispense: 30 tablet;  Refill: 6 - metoprolol tartrate (LOPRESSOR) 100 MG tablet; Take 1 tablet (100 mg total) by mouth 2 (two) times daily.  Dispense: 60 tablet; Refill: 6  4. Type 2 diabetes mellitus with other circulatory complication, without long-term current use of insulin (HCC) Controlled with A1c of 6.4 She is due for an A1c today - metFORMIN (GLUCOPHAGE XR) 500 MG 24 hr tablet; Take 2 tablets (1,000 mg total) by mouth 2 (two) times daily.  Dispense: 120 tablet; Refill: 6 - Hemoglobin A1c; Future - Lipid panel; Future - Basic Metabolic Panel; Future  5. Vertigo Stable - meclizine (ANTIVERT) 25 MG tablet; Take 1 tablet (25 mg total) by mouth 2 (two) times daily as needed for dizziness.  Dispense: 60 tablet; Refill: 2  6. Seasonal allergies Could explain her headaches Refilled Flonase and advised to use Zyrtec as well   7. Chronic pain of both knees Likely underlying osteoarthritis Placed on diclofenac - diclofenac (VOLTAREN) 75 MG EC tablet; Take 1 tablet (75 mg total) by mouth 2 (two) times daily.  Dispense: 60 tablet; Refill: 1  Follow Up Instructions: 3 months   I discussed the assessment and treatment plan with the patient. The patient was provided an opportunity to ask questions and all were answered. The patient agreed with the plan and demonstrated an understanding of the instructions.   The patient was advised to call back or seek an in-person evaluation if the symptoms worsen or if the condition fails to improve as anticipated.     I provided 13 minutes total of non-face-to-face time during this encounter.   Charlott Rakes, MD, FAAFP. [facility], [place]   06/12/2020, 10:43 AM

## 2020-06-13 ENCOUNTER — Other Ambulatory Visit: Payer: Medicare Other

## 2020-06-14 ENCOUNTER — Ambulatory Visit: Payer: Medicare Other | Attending: Family Medicine

## 2020-06-14 ENCOUNTER — Other Ambulatory Visit: Payer: Self-pay

## 2020-06-14 DIAGNOSIS — E1159 Type 2 diabetes mellitus with other circulatory complications: Secondary | ICD-10-CM

## 2020-06-15 ENCOUNTER — Other Ambulatory Visit: Payer: Self-pay | Admitting: Family Medicine

## 2020-06-15 DIAGNOSIS — E1159 Type 2 diabetes mellitus with other circulatory complications: Secondary | ICD-10-CM

## 2020-06-15 LAB — BASIC METABOLIC PANEL
BUN/Creatinine Ratio: 16 (ref 9–23)
BUN: 15 mg/dL (ref 6–24)
CO2: 25 mmol/L (ref 20–29)
Calcium: 9.4 mg/dL (ref 8.7–10.2)
Chloride: 102 mmol/L (ref 96–106)
Creatinine, Ser: 0.92 mg/dL (ref 0.57–1.00)
Glucose: 135 mg/dL — ABNORMAL HIGH (ref 65–99)
Potassium: 3.7 mmol/L (ref 3.5–5.2)
Sodium: 143 mmol/L (ref 134–144)
eGFR: 72 mL/min/{1.73_m2} (ref >59)

## 2020-06-15 LAB — LIPID PANEL
Chol/HDL Ratio: 2.6 ratio (ref 0.0–4.4)
Cholesterol, Total: 110 mg/dL (ref 100–199)
HDL: 43 mg/dL (ref >39)
LDL Chol Calc (NIH): 48 mg/dL (ref 0–99)
Triglycerides: 98 mg/dL (ref 0–149)
VLDL Cholesterol Cal: 19 mg/dL (ref 5–40)

## 2020-06-15 LAB — HEMOGLOBIN A1C
Est. average glucose Bld gHb Est-mCnc: 137 mg/dL
Hgb A1c MFr Bld: 6.4 % — ABNORMAL HIGH (ref 4.8–5.6)

## 2020-06-16 ENCOUNTER — Telehealth: Payer: Self-pay

## 2020-06-16 NOTE — Telephone Encounter (Signed)
Patient name and DOB has been verified Patient was informed of lab results. Patient had no questions.  

## 2020-06-16 NOTE — Telephone Encounter (Signed)
-----   Message from Charlott Rakes, MD sent at 06/15/2020  8:46 AM EDT ----- Please inform her that her A1c is 6.4, cholesterol is normal, kidney and liver functions are normal as well.

## 2020-08-14 ENCOUNTER — Other Ambulatory Visit: Payer: Self-pay

## 2020-08-14 ENCOUNTER — Encounter: Payer: Self-pay | Admitting: Family Medicine

## 2020-08-14 ENCOUNTER — Telehealth: Payer: Self-pay | Admitting: Family Medicine

## 2020-08-14 ENCOUNTER — Telehealth: Payer: Self-pay

## 2020-08-14 ENCOUNTER — Ambulatory Visit: Payer: Medicare Other | Attending: Family Medicine | Admitting: Family Medicine

## 2020-08-14 VITALS — BP 138/77 | HR 107 | Ht 59.0 in | Wt 303.0 lb

## 2020-08-14 DIAGNOSIS — G4733 Obstructive sleep apnea (adult) (pediatric): Secondary | ICD-10-CM | POA: Diagnosis not present

## 2020-08-14 DIAGNOSIS — J302 Other seasonal allergic rhinitis: Secondary | ICD-10-CM | POA: Diagnosis not present

## 2020-08-14 DIAGNOSIS — Z8616 Personal history of COVID-19: Secondary | ICD-10-CM

## 2020-08-14 DIAGNOSIS — E1159 Type 2 diabetes mellitus with other circulatory complications: Secondary | ICD-10-CM | POA: Diagnosis not present

## 2020-08-14 MED ORDER — FLUTICASONE PROPIONATE 50 MCG/ACT NA SUSP
NASAL | 6 refills | Status: DC
  Filled 2020-08-14: qty 16, 30d supply, fill #0

## 2020-08-14 NOTE — Telephone Encounter (Signed)
Call placed to patient regarding her CPAP machine. She explained that she has not been able to tolerate her CPAP machine for quite awhile, possibly a year. She said that someone changed her settings remotely and the machine has not worked well since then. She also said that someone from the company that provided the machine was supposed to come out to her house to check the machine but never came.  She was able to determine that the machine came from [employer].  Informed her that this CM will contact [employer] to inquire about what can be done to adjust the settings.

## 2020-08-14 NOTE — Telephone Encounter (Signed)
Can you please assist this patient in reaching out to her DME company as she has been unable to use her CPAP machine for the last 1 year due to improper settings.  Thank you

## 2020-08-14 NOTE — Progress Notes (Signed)
Subjective:  Patient ID: [name], female    DOB: 03/03/1961  Age: 60 y.o. MRN: [id number]  CC: No chief complaint on file.   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  Interval History:  She uses Oxygen prn during the day and sometimes at night. She has needed Oxygen since 10/2019 when she had COVID and she states as long as she does not exert herself she does not need it. Of recent with the pollen her allergies have been bad and she has needed it. Room air resting pulse oximetry 97 % Room air pulse oximetry during exertion 95% Tested by Doloris Hall, CMA   She is unable to use her CPAP machine as no one has come to fix it and she has been calling the DME company all to no avail. She had a visit for chronic disease management 2 months ago. Past Medical History:  Diagnosis Date  . Headache   . Hypertension   . Iron deficiency anemia   . Obesity   . Seizures ([place])   . Stroke ([place])   . Type 2 diabetes mellitus ([place])     Past Surgical History:  Procedure Laterality Date  . CARDIAC CATHETERIZATION  04/2010   Dr. Terrence Dupont  . COLONOSCOPY WITH PROPOFOL N/A 04/06/2019   Procedure: COLONOSCOPY WITH PROPOFOL;  Surgeon: Mauri Pole, MD;  Location: [facility];  Service: Endoscopy;  Laterality: N/A;  . POLYPECTOMY  04/06/2019   Procedure: POLYPECTOMY;  Surgeon: Mauri Pole, MD;  Location: [facility];  Service: Endoscopy;;    Family History  Problem Relation Age of Onset  . Diabetes Father   . Hypertension Father   . Hypertension Sister   . Stroke Brother     Allergies  Allergen Reactions  . Bee Venom Swelling    Facial swelling  . Eggs Or Egg-Derived Products Nausea And  Vomiting and Swelling    Mouth swelled  . Gabapentin Other (See Comments)    Jittery feeling, dyspnea, crawling sensation  . Gadolinium Derivatives Itching    Immediately after gad injection pt. complained of tongue numbness and lower lip/ then had rt side of body tingling/ pt. Was assessed by Dr. Jeralyn Ruths before leaving/ no medications were administered/    Outpatient Medications Prior to Visit  Medication Sig Dispense Refill  . Accu-Chek FastClix Lancets MISC Check blood sugar up to 3 times daily. E11.59 102 each 6  . acetaminophen (TYLENOL) 500 MG tablet Take 500-1,000 mg by mouth every 6 (six) hours as needed for mild pain or headache.     Marland Kitchen atorvastatin (LIPITOR) 20 MG tablet TAKE 1 TABLET (20 MG TOTAL) BY MOUTH DAILY. 30 tablet 6  . Blood Glucose Monitoring Suppl (ACCU-CHEK GUIDE ME) w/Device KIT 1 kit by Does not apply route daily. Check blood sugar up to 3 times daily. E11.59 1 kit 0  . cetirizine (ZYRTEC) 10 MG tablet Take 10 mg by mouth daily as needed for allergies.     . Colchicine (MITIGARE) 0.6 MG CAPS Take 1 capsule by mouth 2 (two) times daily as needed. 60 capsule 3  . colchicine 0.6 MG tablet Take by mouth 2 (two) times daily as needed.    . diclofenac (VOLTAREN) 75 MG EC tablet TAKE 1 TABLET (75 MG TOTAL)  BY MOUTH 2 (TWO) TIMES DAILY. 60 tablet 1  . diclofenac Sodium (VOLTAREN) 1 % GEL Apply 1 application topically 4 (four) times daily as needed (pain.). OTC    . diphenhydrAMINE (BENADRYL) 25 MG tablet Take 25 mg by mouth at bedtime.    Marland Kitchen glipiZIDE (GLUCOTROL) 5 MG tablet TAKE 1/2 TABLETS BY MOUTH 2 TIMES DAILY BEFORE A MEAL. (Patient taking differently: Take 2.5 mg by mouth 2 (two) times daily before a meal.) 30 tablet 6  . glucose blood (ACCU-CHEK GUIDE) test strip Check blood sugar up to 3 times daily. E11.59 100 each 6  . hydrocortisone (ANUSOL-HC) 25 MG suppository Place 1 suppository (25 mg total) rectally 2 (two) times daily. 12 suppository 0  . hydroxypropyl  methylcellulose / hypromellose (ISOPTO TEARS / GONIOVISC) 2.5 % ophthalmic solution Place 2 drops into both eyes as needed (irritation). (Patient taking differently: Place 2 drops into both eyes 4 (four) times daily as needed (irritation).) 15 mL 1  . levETIRAcetam (KEPPRA) 500 MG tablet TAKE 1 & 1/2 TABLETS BY MOUTH TWICE DAILY. (Patient taking differently: Take 750 mg by mouth 2 (two) times daily.) 90 tablet 8  . lidocaine (LIDODERM) 5 % Place 1 patch onto the skin daily. Remove & Discard patch within 12 hours or as directed by MD 30 patch 0  . lisinopril (ZESTRIL) 10 MG tablet TAKE 1 TABLET (10 MG TOTAL) BY MOUTH DAILY. 30 tablet 6  . meclizine (ANTIVERT) 25 MG tablet TAKE 1 TABLET (25 MG TOTAL) BY MOUTH 2 (TWO) TIMES DAILY AS NEEDED FOR DIZZINESS. 60 tablet 2  . metFORMIN (GLUCOPHAGE-XR) 500 MG 24 hr tablet TAKE 2 TABLETS (1,000 MG TOTAL) BY MOUTH 2 (TWO) TIMES DAILY. 120 tablet 6  . methocarbamol (ROBAXIN) 750 MG tablet TAKE 1 TABLET (750 MG TOTAL) BY MOUTH IN THE MORNING AND AT BEDTIME. 60 tablet 2  . metoprolol tartrate (LOPRESSOR) 100 MG tablet TAKE 1 TABLET (100 MG TOTAL) BY MOUTH 2 (TWO) TIMES DAILY. 60 tablet 6  . tiZANidine (ZANAFLEX) 2 MG tablet Take 1 tablet (2 mg total) by mouth every 8 (eight) hours as needed for muscle spasms. 90 tablet 0  . fluticasone (FLONASE) 50 MCG/ACT nasal spray PLACE 1 SPRAY INTO BOTH NOSTRILS DAILY AS NEEDED FOR ALLERGIES OR RHINITIS. 16 g 1  . cephALEXin (KEFLEX) 500 MG capsule TAKE 1 CAPSULE (500 MG TOTAL) BY MOUTH 4 (FOUR) TIMES DAILY FOR 7 DAYS. (Patient not taking: Reported on 08/14/2020) 28 capsule 0   No facility-administered medications prior to visit.     ROS Review of Systems  Constitutional: Negative for activity change, appetite change and fatigue.  HENT: Negative for congestion, sinus pressure and sore throat.   Eyes: Negative for visual disturbance.  Respiratory: Negative for cough, chest tightness, shortness of breath and wheezing.    Cardiovascular: Negative for chest pain and palpitations.  Gastrointestinal: Negative for abdominal distention, abdominal pain and constipation.  Endocrine: Negative for polydipsia.  Genitourinary: Negative for dysuria and frequency.  Musculoskeletal: Negative for arthralgias and back pain.  Skin: Negative for rash.  Neurological: Negative for tremors, light-headedness and numbness.  Hematological: Does not bruise/bleed easily.  Psychiatric/Behavioral: Negative for agitation and behavioral problems.    Objective:  BP 138/77   Pulse (!) 107   Ht 4' 11" (1.499 m)   Wt (!) 303 lb (137.4 kg)   LMP 02/17/2011 (Exact Date)   SpO2 97%   BMI 61.20 kg/m   BP/Weight 08/14/2020 06/12/2020 12/07/7827  Systolic BP 562 130 -  Diastolic BP 77 83 -  Wt. (Lbs) 303 296.6 289.7  BMI 61.2 59.91 58.51      Physical Exam Constitutional:      Appearance: She is well-developed. She is obese.  Neck:     Vascular: No JVD.  Cardiovascular:     Rate and Rhythm: Tachycardia present.     Heart sounds: Normal heart sounds. No murmur heard.   Pulmonary:     Effort: Pulmonary effort is normal.     Breath sounds: Normal breath sounds. No wheezing or rales.  Chest:     Chest wall: No tenderness.  Abdominal:     General: Bowel sounds are normal. There is no distension.     Palpations: Abdomen is soft. There is no mass.     Tenderness: There is no abdominal tenderness.  Musculoskeletal:        General: Normal range of motion.     Right lower leg: No edema.     Left lower leg: No edema.  Neurological:     Mental Status: She is alert and oriented to person, place, and time.  Psychiatric:        Mood and Affect: Mood normal.      Diabetic Foot Exam - Simple   Simple Foot Form Diabetic Foot exam was performed with the following findings: Yes 08/14/2020 10:05 AM  Visual Inspection No deformities, no ulcerations, no other skin breakdown bilaterally: Yes Sensation Testing Intact to touch and  monofilament testing bilaterally: Yes Pulse Check Posterior Tibialis and Dorsalis pulse intact bilaterally: Yes Comments     CMP Latest Ref Rng & Units 06/14/2020 03/12/2020 11/16/2019  Glucose 65 - 99 mg/dL 135(H) 111(H) 158(H)  BUN 6 - 24 mg/dL 15 15 41(H)  Creatinine 0.57 - 1.00 mg/dL 0.92 1.09(H) 1.06(H)  Sodium 134 - 144 mmol/L 143 138 137  Potassium 3.5 - 5.2 mmol/L 3.7 3.2(L) 3.8  Chloride 96 - 106 mmol/L 102 97(L) 95(L)  CO2 20 - 29 mmol/L _0 Calcium 8.7 - 10.2 mg/dL 9.4 9.1 8.9  Total Protein 6.5 - 8.1 g/dL - 8.9(H) -  Total Bilirubin 0.3 - 1.2 mg/dL - 0.5 -  Alkaline Phos 38 - 126 U/L - 61 -  AST 15 - 41 U/L - 14(L) -  ALT 0 - 44 U/L - 12 -    Lipid Panel     Component Value Date/Time   CHOL 110 06/14/2020 0911   TRIG 98 06/14/2020 0911   HDL 43 06/14/2020 0911   CHOLHDL 2.6 06/14/2020 0911   CHOLHDL 2.7 10/08/2015 0541   VLDL 12 10/08/2015 0541   LDLCALC 48 06/14/2020 0911    CBC    Component Value Date/Time   WBC 11.4 (H) 03/12/2020 1829   RBC 5.17 (H) 03/12/2020 1829   HGB 12.1 03/12/2020 1829   HGB 11.5 09/10/2016 1109   HCT 40.8 03/12/2020 1829   HCT 36.4 09/10/2016 1109   PLT 311 03/12/2020 1829   PLT 341 09/10/2016 1109   MCV 78.9 (L) 03/12/2020 1829   MCV 77 (L) 09/10/2016 1109   MCH 23.4 (L) 03/12/2020 1829   MCHC 29.7 (L) 03/12/2020 1829   RDW 14.8 03/12/2020 1829   RDW 15.5 (H) 09/10/2016 1109   LYMPHSABS 2.8 03/12/2020 1829   LYMPHSABS 3.0 09/10/2016 1109   MONOABS 1.0 03/12/2020 1829   EOSABS 0.1 03/12/2020 1829   EOSABS 0.2 09/10/2016 1109   BASOSABS 0.1 03/12/2020 1829   BASOSABS 0.0 09/10/2016 1109  Lab Results  Component Value Date   HGBA1C 6.4 (H) 06/14/2020    Assessment & Plan:  1. Type 2 diabetes mellitus with other circulatory complication, without long-term current use of insulin (HCC) Controlled with A1c of 6.4 Continue metformin Counseled on Diabetic diet, my plate method, 409 minutes of moderate intensity  exercise/week Blood sugar logs with fasting goals of 80-120 mg/dl, random of less than 180 and in the event of sugars less than 60 mg/dl or greater than 400 mg/dl encouraged to notify the clinic. Advised on the need for annual eye exams, annual foot exams, Pneumonia vaccine.  2. Seasonal allergies Continue Zyrtec - fluticasone (FLONASE) 50 MCG/ACT nasal spray; PLACE 1 SPRAY INTO BOTH NOSTRILS DAILY AS NEEDED FOR ALLERGIES OR RHINITIS.  Dispense: 16 g; Refill: 6  3. History of COVID-19 Asymptomatic Currently on 2 L of oxygen as needed Oxygen testing today does not qualify her for oxygen   4. Obstructive sleep apnea We will have the case manager reach out to her DME company to assist with her CPAP supplies   Health Care Maintenance: Declines pneumonia vaccine Meds ordered this encounter  Medications  . fluticasone (FLONASE) 50 MCG/ACT nasal spray    Sig: PLACE 1 SPRAY INTO BOTH NOSTRILS DAILY AS NEEDED FOR ALLERGIES OR RHINITIS.    Dispense:  16 g    Refill:  6    Follow-up: Return in about 4 months (around 12/15/2020) for Chronic disease management.       Charlott Rakes, MD, FAAFP. [facility] [place], [place]   08/14/2020, 12:59 PM

## 2020-08-14 NOTE — Patient Instructions (Signed)
Diabetes Mellitus and Foot Care Foot care is an important part of your health, especially when you have diabetes. Diabetes may cause you to have problems because of poor blood flow (circulation) to your feet and legs, which can cause your skin to:  Become thinner and drier.  Break more easily.  Heal more slowly.  Peel and crack. You may also have nerve damage (neuropathy) in your legs and feet, causing decreased feeling in them. This means that you may not notice minor injuries to your feet that could lead to more serious problems. Noticing and addressing any potential problems early is the best way to prevent future foot problems. How to care for your feet Foot hygiene  Wash your feet daily with warm water and mild soap. Do not use hot water. Then, pat your feet and the areas between your toes until they are completely dry. Do not soak your feet as this can dry your skin.  Trim your toenails straight across. Do not dig under them or around the cuticle. File the edges of your nails with an emery board or nail file.  Apply a moisturizing lotion or petroleum jelly to the skin on your feet and to dry, brittle toenails. Use lotion that does not contain alcohol and is unscented. Do not apply lotion between your toes.   Shoes and socks  Wear clean socks or stockings every day. Make sure they are not too tight. Do not wear knee-high stockings since they may decrease blood flow to your legs.  Wear shoes that fit properly and have enough cushioning. Always look in your shoes before you put them on to be sure there are no objects inside.  To break in new shoes, wear them for just a few hours a day. This prevents injuries on your feet. Wounds, scrapes, corns, and calluses  Check your feet daily for blisters, cuts, bruises, sores, and redness. If you cannot see the bottom of your feet, use a mirror or ask someone for help.  Do not cut corns or calluses or try to remove them with medicine.  If you  find a minor scrape, cut, or break in the skin on your feet, keep it and the skin around it clean and dry. You may clean these areas with mild soap and water. Do not clean the area with peroxide, alcohol, or iodine.  If you have a wound, scrape, corn, or callus on your foot, look at it several times a day to make sure it is healing and not infected. Check for: ? Redness, swelling, or pain. ? Fluid or blood. ? Warmth. ? Pus or a bad smell.   General tips  Do not cross your legs. This may decrease blood flow to your feet.  Do not use heating pads or hot water bottles on your feet. They may burn your skin. If you have lost feeling in your feet or legs, you may not know this is happening until it is too late.  Protect your feet from hot and cold by wearing shoes, such as at the beach or on hot pavement.  Schedule a complete foot exam at least once a year (annually) or more often if you have foot problems. Report any cuts, sores, or bruises to your health care provider immediately. Where to find more information  American Diabetes Association: www.diabetes.org  Association of Diabetes Care & Education Specialists: www.diabeteseducator.org Contact a health care provider if:  You have a medical condition that increases your risk of infection and   you have any cuts, sores, or bruises on your feet.  You have an injury that is not healing.  You have redness on your legs or feet.  You feel burning or tingling in your legs or feet.  You have pain or cramps in your legs and feet.  Your legs or feet are numb.  Your feet always feel cold.  You have pain around any toenails. Get help right away if:  You have a wound, scrape, corn, or callus on your foot and: ? You have pain, swelling, or redness that gets worse. ? You have fluid or blood coming from the wound, scrape, corn, or callus. ? Your wound, scrape, corn, or callus feels warm to the touch. ? You have pus or a bad smell coming from  the wound, scrape, corn, or callus. ? You have a fever. ? You have a red line going up your leg. Summary  Check your feet every day for blisters, cuts, bruises, sores, and redness.  Apply a moisturizing lotion or petroleum jelly to the skin on your feet and to dry, brittle toenails.  Wear shoes that fit properly and have enough cushioning.  If you have foot problems, report any cuts, sores, or bruises to your health care provider immediately.  Schedule a complete foot exam at least once a year (annually) or more often if you have foot problems. This information is not intended to replace advice given to you by your health care provider. Make sure you discuss any questions you have with your health care provider. Document Revised: 09/30/2019 Document Reviewed: 09/30/2019 Elsevier Patient Education  2021 Elsevier Inc.  

## 2020-08-15 ENCOUNTER — Telehealth: Payer: Self-pay | Admitting: Family Medicine

## 2020-08-15 NOTE — Telephone Encounter (Signed)
Call placed to [place] regarding this message.  Spoke to [employer] who said that Bethena Roys was calling to confirm if the patient had an appointment with Dr Margarita Rana 05/29/2020 because they needed to re-certify the patient's O2.   Informed her that the patient was not seen 05/29/2020 but was seen 08/14/2020. She said that they will be faxing information to this office Dr Margarita Rana regarding the O2.

## 2020-08-15 NOTE — Telephone Encounter (Signed)
Call placed to [employer] # [phone], spoke to [facility] who confirmed that they provided the patient with the CPAP machine which she now owns.   She explained that they received an order to change CPAP setting on 01/21/2019 from Debbora Presto, NP and the settings were changed. There is no reference to contacting he patient regarding making a home visit.   Holley Raring also noted that they do not make home visits unless the patient is bedbound.  The last contact they had with the patient was 4/16/ 2021.  She reported that her mask gave her headaches and they told her that she needed a mask fitting but they have not received any new orders or had contact with the patient regarding this CPAP.   They need orders if any changes need to be made to the CPAP settings.

## 2020-08-15 NOTE — Telephone Encounter (Signed)
[name] from [facility] called requesting information from the office, she mentioned office notes and a prescription but declined to share any further details. Requesting to speak to clinic.  Best contact: [phone] Fax: [phone]

## 2020-08-16 NOTE — Telephone Encounter (Signed)
Can we please have the patient reach out to Debbora Presto, NP with Neurology who had given the orders for the change in CPAP settings and patient might need to see them for a visit? Thanks

## 2020-08-16 NOTE — Telephone Encounter (Signed)
Pt did walk test at last visit and she did not qualify for O2

## 2020-08-16 NOTE — Telephone Encounter (Signed)
Call placed to patient and informed her that Dr Margarita Rana recommend that she contact Debbora Presto, NP/ neurology with her concerns about her CPAP settings and she said she would call.   She also stated  that someone called her about the machine today and asked who made the machine but she was not sure who they were or why they were calling.

## 2020-08-24 ENCOUNTER — Other Ambulatory Visit: Payer: Self-pay

## 2020-08-24 ENCOUNTER — Encounter: Payer: Self-pay | Admitting: Physical Medicine & Rehabilitation

## 2020-08-24 ENCOUNTER — Encounter: Payer: Medicare Other | Attending: Physical Medicine & Rehabilitation | Admitting: Physical Medicine & Rehabilitation

## 2020-08-24 VITALS — BP 158/99 | HR 108 | Temp 98.3°F | Ht 59.0 in | Wt 307.0 lb

## 2020-08-24 DIAGNOSIS — R269 Unspecified abnormalities of gait and mobility: Secondary | ICD-10-CM | POA: Diagnosis not present

## 2020-08-24 DIAGNOSIS — I69398 Other sequelae of cerebral infarction: Secondary | ICD-10-CM | POA: Diagnosis not present

## 2020-08-24 DIAGNOSIS — I611 Nontraumatic intracerebral hemorrhage in hemisphere, cortical: Secondary | ICD-10-CM | POA: Diagnosis not present

## 2020-08-24 DIAGNOSIS — M7541 Impingement syndrome of right shoulder: Secondary | ICD-10-CM | POA: Diagnosis not present

## 2020-08-24 DIAGNOSIS — I10 Essential (primary) hypertension: Secondary | ICD-10-CM | POA: Diagnosis not present

## 2020-08-24 MED ORDER — METHOCARBAMOL 750 MG PO TABS
ORAL_TABLET | ORAL | 5 refills | Status: AC
  Filled 2020-08-24 – 2021-03-13 (×2): qty 60, 30d supply, fill #0

## 2020-08-24 NOTE — Progress Notes (Signed)
Subjective:    Patient ID: [name], female    DOB: 02-05-1961, 60 y.o.   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   Last clinic visit 06/12/20. Since that time, pt states she is now interested in getting in water where other people have been in. She states her family is looking into a personal pool.  She is ambulating with her husband. Denies seizures. She is not losing weight, states she is gaining weight. She is states she is trying to go through steps to lose weight.  She continues to drink sodas.  She denies falls. Shoulder "feels great". BP is elevated today, states she does not check at home.    Pain Inventory Average Pain  2 Pain Right Now 2 My pain is intermittent and aching  In the last 24 hours, has pain interfered with the following? General activity 3 Relation with others 3 Enjoyment of life 3 What TIME of day is your pain at its worst? Anytime Sleep (in general) Fair  Pain is worse with: walking and some activites Pain improves with: rest and medication Relief from Meds: good    Family History  Problem Relation Age of Onset  . Diabetes Father   . Hypertension Father   . Hypertension Sister   . Stroke Brother    Social History   Socioeconomic History  . Marital status: Married    Spouse name: Not on file  . Number of children: Not on file  . Years of education: Not on file  . Highest education level: Not on file  Occupational History  . Not on file  Tobacco Use  . Smoking status: Never Smoker  . Smokeless tobacco: Never Used  Vaping Use  . Vaping Use: Never used  Substance and Sexual Activity  . Alcohol use: No  . Drug use: No  . Sexual activity: Not Currently  Other Topics Concern  . Not on file  Social History Narrative  . Not on file   Social Determinants of Health   Financial Resource Strain: Not on file  Food  Insecurity: Not on file  Transportation Needs: Not on file  Physical Activity: Not on file  Stress: Not on file  Social Connections: Not on file   Past Surgical History:  Procedure Laterality Date  . CARDIAC CATHETERIZATION  04/2010   Dr. Terrence Dupont  . COLONOSCOPY WITH PROPOFOL N/A 04/06/2019   Procedure: COLONOSCOPY WITH PROPOFOL;  Surgeon: Mauri Pole, MD;  Location: [facility];  Service: Endoscopy;  Laterality: N/A;  . POLYPECTOMY  04/06/2019   Procedure: POLYPECTOMY;  Surgeon: Mauri Pole, MD;  Location: [facility];  Service: Endoscopy;;   Past Medical History:  Diagnosis Date  . Headache   . Hypertension   . Iron deficiency anemia   . Obesity   . Seizures ([place])   . Stroke ([place])   . Type 2 diabetes mellitus ([facility])    BP (!) 158/99   Pulse (!) 108   Temp 98.3 F (36.8 C)   Ht 4\' 11"  (1.499 m)   Wt (!) 307 lb (139.3 kg)   LMP 02/17/2011 (Exact Date)   SpO2 100%   BMI 62.01 kg/m   Opioid Risk Score:   Fall Risk Score:  `1  Depression screen PHQ 2/9  Depression screen [facility] 2/9 08/24/2020 06/12/2020 03/13/2020 08/02/2019 02/15/2019 10/20/2018 06/05/2018  Decreased Interest 0 0 2 3  0 0 1  Down, Depressed, Hopeless 0 0 2 3 0 0 1  PHQ - 2 Score 0 0 4 6 0 0 2  Altered sleeping - - 2 - 0 2 -  Tired, decreased energy - - 1 - 0 0 -  Change in appetite - - 2 - 0 2 -  Feeling bad or failure about yourself  - - 0 - 0 0 -  Trouble concentrating - - 0 - 0 0 -  Moving slowly or fidgety/restless - - 0 - 0 2 -  Suicidal thoughts - - 0 - 0 0 -  PHQ-9 Score - - 9 - 0 6 -  Some recent data might be hidden    Review of Systems  Constitutional: Negative.        Bladder control problems   HENT: Negative.   Eyes: Negative.   Respiratory: Positive for apnea, cough, shortness of breath and wheezing.   Cardiovascular: Positive for leg swelling.  Gastrointestinal: Positive for diarrhea.  Endocrine: Negative.   Genitourinary: Positive for frequency.  Musculoskeletal:  Positive for arthralgias, back pain and gait problem.       Spasms   Skin: Negative.   Neurological: Positive for weakness.       Tingling    Hematological: Negative.   Psychiatric/Behavioral: Positive for confusion, dysphoric mood and sleep disturbance.      Objective:   Physical Exam  Constitutional: No distress . Vital signs reviewed. HENT: Normocephalic.  Atraumatic. Eyes: EOMI. No discharge. Cardiovascular: No JVD.   Respiratory: Normal effort.  No stridor.   GI: Non-distended.   Skin: Warm and dry.  Intact. Psych: Normal mood.  Normal behavior. Musc: No edema in extremities.  No tenderness in extremities. Gait: Slow cadence No TTP right AC joint. 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  1.S/p right temporal intracranial hemorrhage with dysesthesias  Side effects with gabapentin             Aquatic therapy denied by insurance, encouraged [facility] up at [facility], states she does not want to be in water where others have been  States she is ambulating with husband  MRI previously reviewed - stable infarct with empty sella persistent  Continue meds  Stopped taking Cymbalta, will not restart at present  2. Depression     Reaction to Prozaac  Pt states she is managing with her chaplain and scriptures  Stopped taking Cymbalta  3. Seizures             Continue meds, believes she is taking, but not sure  4. Morbid obesity  Continue follow up with dietitian             Continue to encourage weight loss again  Patient to follow up with PCP bariatric surgery - states she is working on going through a checklist, but gaining weight  5. Muscle spasms             Encouraged ROM and stretching  Continue Robaxin 750 BID PRN  6. Abnormality of gait - post stroke              Continue cane  Therapies completed, cont HEP  Discussed environmental modifications  7. Right shoulder pain/AC - Rotator cuff tendinopathy  Xrays reviewed with some degenerative change right shoulder  States she had a steroid injection with benefit  Encouraged Voltaren gel  Improving  8. Right leg pain  See #5, #7  9. Falls  Environmental factors, encouraged modifications.  Encouraged pt to push bed against wall or place furniture against bed to limit chances of rolling onto floor  See #6  10. 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 Urinary frequency  Mainly nocturnal  Encouraged decrease in nighttime intake and increase daytime fluid intake, reminded again x3  Patient to follow up with PCP

## 2020-08-31 ENCOUNTER — Other Ambulatory Visit: Payer: Self-pay

## 2020-09-11 ENCOUNTER — Other Ambulatory Visit: Payer: Self-pay

## 2020-09-11 ENCOUNTER — Ambulatory Visit
Admission: RE | Admit: 2020-09-11 | Discharge: 2020-09-11 | Disposition: A | Discharge status: Home / Self Care | Payer: Medicare Other | Source: Ambulatory Visit | Attending: Family Medicine | Admitting: Family Medicine

## 2020-09-11 DIAGNOSIS — Z1231 Encounter for screening mammogram for malignant neoplasm of breast: Secondary | ICD-10-CM

## 2020-09-12 ENCOUNTER — Ambulatory Visit: Payer: Medicare Other | Admitting: Physical Medicine & Rehabilitation

## 2020-09-15 ENCOUNTER — Encounter: Payer: Self-pay | Admitting: *Deleted

## 2020-10-16 ENCOUNTER — Telehealth: Payer: Self-pay | Admitting: Family Medicine

## 2020-10-16 MED ORDER — MISC. DEVICES MISC: 0 refills | Status: DC

## 2020-10-16 NOTE — Telephone Encounter (Signed)
Will route to PCP for review. 

## 2020-10-16 NOTE — Telephone Encounter (Signed)
Done

## 2020-10-16 NOTE — Telephone Encounter (Signed)
[name] with [facility] called and asked if office can send a request for an over night oximetry test / fax# [phone] please advise

## 2020-10-17 ENCOUNTER — Other Ambulatory Visit: Payer: Self-pay

## 2020-10-17 MED ORDER — MISC. DEVICES MISC: 0 refills | Status: DC

## 2020-10-24 NOTE — Telephone Encounter (Signed)
[name] with [facility] called in to request an addendum from Dr Margarita Rana for the order they received for Overnight Oximetry test need to know if it should be room air. Also need a discontinuation script for Home fill compressor code # [id number].  Please send to Fax# [phone] Ph# [phone]

## 2020-11-06 ENCOUNTER — Telehealth: Payer: Self-pay | Admitting: Family Medicine

## 2020-11-06 NOTE — Telephone Encounter (Signed)
Judy from [facility] called - they need to  discontinue the  rx for portable oxygen machine   They also need -  A new request with details that pt can use room air  Please call [phone]  Thank you

## 2020-11-07 MED ORDER — MISC. DEVICES MISC: 0 refills | Status: DC

## 2020-11-07 NOTE — Telephone Encounter (Signed)
Done

## 2020-11-07 NOTE — Telephone Encounter (Signed)
Routing to PCP for review.

## 2020-11-08 ENCOUNTER — Other Ambulatory Visit: Payer: Self-pay

## 2020-11-08 MED ORDER — MISC. DEVICES MISC: 0 refills | Status: DC

## 2020-11-19 ENCOUNTER — Telehealth: Payer: Self-pay

## 2020-11-19 NOTE — Telephone Encounter (Signed)
Called pt to schedule AWV. NO answer and left a message to call and schedule when available.

## 2020-12-04 ENCOUNTER — Telehealth: Payer: Self-pay

## 2020-12-04 NOTE — Telephone Encounter (Signed)
Called pt to schedule her for her annual wellness visit. Pt scheduled for 9/14 at 3pm

## 2020-12-06 ENCOUNTER — Ambulatory Visit (HOSPITAL_BASED_OUTPATIENT_CLINIC_OR_DEPARTMENT_OTHER): Payer: Medicare Other

## 2020-12-06 DIAGNOSIS — Z0000 Encounter for general adult medical examination without abnormal findings: Secondary | ICD-10-CM

## 2020-12-06 NOTE — Patient Instructions (Signed)
Health Maintenance, Female Adopting a healthy lifestyle and getting preventive care are important in promoting health and wellness. Ask your health care provider about: The right schedule for you to have regular tests and exams. Things you can do on your own to prevent diseases and keep yourself healthy. What should I know about diet, weight, and exercise? Eat a healthy diet  Eat a diet that includes plenty of vegetables, fruits, low-fat dairy products, and lean protein. Do not eat a lot of foods that are high in solid fats, added sugars, or sodium. Maintain a healthy weight Body mass index (BMI) is used to identify weight problems. It estimates body fat based on height and weight. Your health care provider can help determine your BMI and help you achieve or maintain a healthy weight. Get regular exercise Get regular exercise. This is one of the most important things you can do for your health. Most adults should: Exercise for at least 150 minutes each week. The exercise should increase your heart rate and make you sweat (moderate-intensity exercise). Do strengthening exercises at least twice a week. This is in addition to the moderate-intensity exercise. Spend less time sitting. Even light physical activity can be beneficial. Watch cholesterol and blood lipids Have your blood tested for lipids and cholesterol at 60 years of age, then have this test every 5 years. Have your cholesterol levels checked more often if: Your lipid or cholesterol levels are high. You are older than 60 years of age. You are at high risk for heart disease. What should I know about cancer screening? Depending on your health history and family history, you may need to have cancer screening at various ages. This may include screening for: Breast cancer. Cervical cancer. Colorectal cancer. Skin cancer. Lung cancer. What should I know about heart disease, diabetes, and high blood pressure? Blood pressure and heart  disease High blood pressure causes heart disease and increases the risk of stroke. This is more likely to develop in people who have high blood pressure readings, are of African descent, or are overweight. Have your blood pressure checked: Every 3-5 years if you are 18-39 years of age. Every year if you are 40 years old or older. Diabetes Have regular diabetes screenings. This checks your fasting blood sugar level. Have the screening done: Once every three years after age 40 if you are at a normal weight and have a low risk for diabetes. More often and at a younger age if you are overweight or have a high risk for diabetes. What should I know about preventing infection? Hepatitis B If you have a higher risk for hepatitis B, you should be screened for this virus. Talk with your health care provider to find out if you are at risk for hepatitis B infection. Hepatitis C Testing is recommended for: Everyone born from 1945 through 1965. Anyone with known risk factors for hepatitis C. Sexually transmitted infections (STIs) Get screened for STIs, including gonorrhea and chlamydia, if: You are sexually active and are younger than 60 years of age. You are older than 60 years of age and your health care provider tells you that you are at risk for this type of infection. Your sexual activity has changed since you were last screened, and you are at increased risk for chlamydia or gonorrhea. Ask your health care provider if you are at risk. Ask your health care provider about whether you are at high risk for HIV. Your health care provider may recommend a prescription medicine   to help prevent HIV infection. If you choose to take medicine to prevent HIV, you should first get tested for HIV. You should then be tested every 3 months for as long as you are taking the medicine. Pregnancy If you are about to stop having your period (premenopausal) and you may become pregnant, seek counseling before you get  pregnant. Take 400 to 800 micrograms (mcg) of folic acid every day if you become pregnant. Ask for birth control (contraception) if you want to prevent pregnancy. Osteoporosis and menopause Osteoporosis is a disease in which the bones lose minerals and strength with aging. This can result in bone fractures. If you are 65 years old or older, or if you are at risk for osteoporosis and fractures, ask your health care provider if you should: Be screened for bone loss. Take a calcium or vitamin D supplement to lower your risk of fractures. Be given hormone replacement therapy (HRT) to treat symptoms of menopause. Follow these instructions at home: Lifestyle Do not use any products that contain nicotine or tobacco, such as cigarettes, e-cigarettes, and chewing tobacco. If you need help quitting, ask your health care provider. Do not use street drugs. Do not share needles. Ask your health care provider for help if you need support or information about quitting drugs. Alcohol use Do not drink alcohol if: Your health care provider tells you not to drink. You are pregnant, may be pregnant, or are planning to become pregnant. If you drink alcohol: Limit how much you use to 0-1 drink a day. Limit intake if you are breastfeeding. Be aware of how much alcohol is in your drink. In the [place] one drink equals one 12 oz bottle of beer (355 mL), one 5 oz glass of wine (148 mL), or one 1 oz glass of hard liquor (44 mL). General instructions Schedule regular health, dental, and eye exams. Stay current with your vaccines. Tell your health care provider if: You often feel depressed. You have ever been abused or do not feel safe at home. Summary Adopting a healthy lifestyle and getting preventive care are important in promoting health and wellness. Follow your health care provider's instructions about healthy diet, exercising, and getting tested or screened for diseases. Follow your health care provider's  instructions on monitoring your cholesterol and blood pressure. This information is not intended to replace advice given to you by your health care provider. Make sure you discuss any questions you have with your health care provider. Document Revised: 05/19/2020 Document Reviewed: 03/04/2018 Elsevier Patient Education  2022 [employer]  

## 2020-12-06 NOTE — Progress Notes (Signed)
Subjective:   [name] is a 60 y.o. female who presents for an Initial Medicare Annual Wellness Visit.I connected with  [name] on 12/06/20 by a Audio enabled telemedicine application and verified that I am speaking with the correct person using two identifiers.   I discussed the limitations of evaluation and management by telemedicine. The patient expressed understanding and agreed to proceed.  Location of Patient:Home Location of Provider:Office  Persons participating in visit: [name](patient) Quintella Baton cma  Review of Systems    Defer to Pcp       Objective:    Today's Vitals   12/06/20 1405  PainSc: 7    There is no height or weight on file to calculate BMI.  Advanced Directives 12/06/2020 11/10/2019 11/09/2019 08/02/2019 04/06/2019 03/01/2018 04/21/2017  Does Patient Have a Medical Advance Directive? _0  No No  Would patient like information on creating a medical advance directive? - No - Patient declined - No - Patient declined Yes (Inpatient - patient defers creating a medical advance directive and declines information at this time) No - Patient declined No - Patient declined    Current Medications (verified) Outpatient Encounter Medications as of 12/06/2020  Medication Sig   Accu-Chek FastClix Lancets MISC Check blood sugar up to 3 times daily. E11.59   acetaminophen (TYLENOL) 500 MG tablet Take 500-1,000 mg by mouth every 6 (six) hours as needed for mild pain or headache.    atorvastatin (LIPITOR) 20 MG tablet TAKE 1 TABLET (20 MG TOTAL) BY MOUTH DAILY.   Blood Glucose Monitoring Suppl (ACCU-CHEK GUIDE ME) w/Device KIT 1 kit by Does not apply route daily. Check blood sugar up to 3 times daily. E11.59   cephALEXin (KEFLEX) 500 MG capsule TAKE 1 CAPSULE (500 MG TOTAL) BY MOUTH 4 (FOUR) TIMES DAILY FOR 7 DAYS.   cetirizine (ZYRTEC) 10 MG tablet Take 10 mg by mouth daily as needed for allergies.    Colchicine (MITIGARE) 0.6 MG CAPS Take 1  capsule by mouth 2 (two) times daily as needed.   colchicine 0.6 MG tablet Take by mouth 2 (two) times daily as needed.   diclofenac (VOLTAREN) 75 MG EC tablet TAKE 1 TABLET (75 MG TOTAL) BY MOUTH 2 (TWO) TIMES DAILY.   diclofenac Sodium (VOLTAREN) 1 % GEL Apply 1 application topically 4 (four) times daily as needed (pain.). OTC   diphenhydrAMINE (BENADRYL) 25 MG tablet Take 25 mg by mouth at bedtime.   fluticasone (FLONASE) 50 MCG/ACT nasal spray PLACE 1 SPRAY INTO BOTH NOSTRILS DAILY AS NEEDED FOR ALLERGIES OR RHINITIS.   glucose blood (ACCU-CHEK GUIDE) test strip Check blood sugar up to 3 times daily. E11.59   hydrocortisone (ANUSOL-HC) 25 MG suppository Place 1 suppository (25 mg total) rectally 2 (two) times daily.   hydroxypropyl methylcellulose / hypromellose (ISOPTO TEARS / GONIOVISC) 2.5 % ophthalmic solution Place 2 drops into both eyes as needed (irritation). (Patient taking differently: Place 2 drops into both eyes 4 (four) times daily as needed (irritation).)   levETIRAcetam (KEPPRA) 500 MG tablet TAKE 1 & 1/2 TABLETS BY MOUTH TWICE DAILY. (Patient taking differently: Take 750 mg by mouth 2 (two) times daily.)   lidocaine (LIDODERM) 5 % Place 1 patch onto the skin daily. Remove & Discard patch within 12 hours or as directed by MD   lisinopril (ZESTRIL) 10 MG tablet TAKE 1 TABLET (10 MG TOTAL) BY MOUTH DAILY.   meclizine (ANTIVERT) 25 MG tablet TAKE 1 TABLET (25 MG  TOTAL) BY MOUTH 2 (TWO) TIMES DAILY AS NEEDED FOR DIZZINESS.   metFORMIN (GLUCOPHAGE-XR) 500 MG 24 hr tablet TAKE 2 TABLETS (1,000 MG TOTAL) BY MOUTH 2 (TWO) TIMES DAILY.   methocarbamol (ROBAXIN) 750 MG tablet TAKE 1 TABLET (750 MG TOTAL) BY MOUTH IN THE MORNING AND AT BEDTIME.   metoprolol tartrate (LOPRESSOR) 100 MG tablet TAKE 1 TABLET (100 MG TOTAL) BY MOUTH 2 (TWO) TIMES DAILY.   Misc. Devices MISC Overnight pulse oximetry test.  Diagnosis obstructive sleep apnea   Misc. Devices MISC Discontinue Oxygen. Respiration- room  air   glipiZIDE (GLUCOTROL) 5 MG tablet TAKE 1/2 TABLETS BY MOUTH 2 TIMES DAILY BEFORE A MEAL. (Patient taking differently: Take 2.5 mg by mouth 2 (two) times daily before a meal.)   No facility-administered encounter medications on file as of 12/06/2020.    Allergies (verified) Bee venom, Eggs or egg-derived products, Gabapentin, and Gadolinium derivatives   History: Past Medical History:  Diagnosis Date   Headache    Hypertension    Iron deficiency anemia    Obesity    Seizures ([place])    Stroke ([place])    Type 2 diabetes mellitus ([place])    Past Surgical History:  Procedure Laterality Date   CARDIAC CATHETERIZATION  04/2010   Dr. Terrence Dupont   COLONOSCOPY WITH PROPOFOL N/A 04/06/2019   Procedure: COLONOSCOPY WITH PROPOFOL;  Surgeon: Mauri Pole, MD;  Location: [facility];  Service: Endoscopy;  Laterality: N/A;   POLYPECTOMY  04/06/2019   Procedure: POLYPECTOMY;  Surgeon: Mauri Pole, MD;  Location: [facility];  Service: Endoscopy;;   Family History  Problem Relation Age of Onset   Diabetes Father    Hypertension Father    Hypertension Sister    Stroke Brother    Social History   Socioeconomic History   Marital status: Married    Spouse name: Not on file   Number of children: Not on file   Years of education: Not on file   Highest education level: Not on file  Occupational History   Not on file  Tobacco Use   Smoking status: Never   Smokeless tobacco: Never  Vaping Use   Vaping Use: Never used  Substance and Sexual Activity   Alcohol use: No   Drug use: No   Sexual activity: Not Currently  Other Topics Concern   Not on file  Social History Narrative   Not on file   Social Determinants of Health   Financial Resource Strain: Low Risk    Difficulty of Paying Living Expenses: Not hard at all  Food Insecurity: No Food Insecurity   Worried About Charity fundraiser in the Last Year: Never true   [place] in the Last Year: Never true   Transportation Needs: No Transportation Needs   Lack of Transportation (Medical): No   Lack of Transportation (Non-Medical): No  Physical Activity: Inactive   Days of Exercise per Week: 0 days   Minutes of Exercise per Session: 0 min  Stress: No Stress Concern Present   Feeling of Stress : Not at all  Social Connections: Moderately Integrated   Frequency of Communication with Friends and Family: Once a week   Frequency of Social Gatherings with Friends and Family: Once a week   Attends Religious Services: More than 4 times per year   Active Member of [employer] or Organizations: No   Attends Music therapist: More than 4 times per year   Marital Status: Married  Tobacco Counseling Counseling given: Not Answered   Clinical Intake:  Pre-visit preparation completed: Yes  Pain : 0-10 Pain Score: 7  Pain Location: Back Pain Orientation: Right, Left, Mid Pain Descriptors / Indicators: Sharp Pain Onset: In the past 7 days Pain Frequency: Occasional     Nutritional Risks: Unintentional weight gain (100 pounds)  How often do you need to have someone help you when you read instructions, pamphlets, or other written materials from your doctor or pharmacy?: 1 - Never  Diabetic?NO  Interpreter Needed?: No      Activities of Daily Living In your present state of health, do you have any difficulty performing the following activities: 12/06/2020  Hearing? N  Vision? Y  Difficulty concentrating or making decisions? N  Walking or climbing stairs? N  Dressing or bathing? N  Doing errands, shopping? Y  Preparing Food and eating ? Y  Using the Toilet? N  In the past six months, have you accidently leaked urine? Y  Do you have problems with loss of bowel control? N  Managing your Medications? N  Managing your Finances? N  Housekeeping or managing your Housekeeping? N  Some recent data might be hidden    Patient Care Team: Charlott Rakes, MD as PCP - General  (Family Medicine)  Indicate any recent Medical Services you may have received from other than [facility] providers in the past year (date may be approximate).     Assessment:   This is a routine wellness examination for [name].  Hearing/Vision screen No results found.  Dietary issues and exercise activities discussed:     Goals Addressed   None    Depression Screen PHQ 2/9 Scores 12/06/2020 08/24/2020 06/12/2020 03/13/2020 08/02/2019 02/15/2019 10/20/2018  PHQ - 2 Score 2 0 0 4 6 0 0  PHQ- 9 Score 8 - - 9 - 0 6    Fall Risk Fall Risk  12/06/2020 08/24/2020 08/14/2020 06/12/2020 04/11/2020  Falls in the past year? 1 0 0 0 1  Comment - - - - -  Number falls in past yr: 1 0 0 0 1  Injury with Fall? 0 0 0 0 0  Comment - - - - -  Risk for fall due to : History of fall(s) - - - -  Risk for fall due to: Comment - - - - -  Follow up - - - - -    FALL RISK PREVENTION PERTAINING TO THE HOME:  Any stairs in or around the home? No  If so, are there any without handrails? No  Home free of loose throw rugs in walkways, pet beds, electrical cords, etc? Yes  Adequate lighting in your home to reduce risk of falls? Yes   ASSISTIVE DEVICES UTILIZED TO PREVENT FALLS:  Life alert? No  Use of a cane, walker or w/c? Yes  Grab bars in the bathroom? Yes  Shower chair or bench in shower? Yes  Elevated toilet seat or a handicapped toilet? No   TIMED UP AND GO:  Was the test performed? No .  Length of time to ambulate 10 feet: n/a sec.     Cognitive Function:     6CIT Screen 12/06/2020  What Year? 0 points  What month? 0 points  What time? 0 points  Count back from 20 0 points  Months in reverse 0 points  Repeat phrase 0 points  Total Score 0    Immunizations Immunization History  Administered Date(s) Administered   Tdap 02/20/2016  TDAP status: Up to date  Flu Vaccine status: Due, Education has been provided regarding the importance of this vaccine. Advised may receive this vaccine  at local pharmacy or Health Dept. Aware to provide a copy of the vaccination record if obtained from local pharmacy or Health Dept. Verbalized acceptance and understanding.  Pneumococcal vaccine status: Declined,  Education has been provided regarding the importance of this vaccine but patient still declined. Advised may receive this vaccine at local pharmacy or Health Dept. Aware to provide a copy of the vaccination record if obtained from local pharmacy or Health Dept. Verbalized acceptance and understanding.   Covid-19 vaccine status: Information provided on how to obtain vaccines.   Qualifies for Shingles Vaccine? Yes   Zostavax completed No   Shingrix Completed?: No.    Education has been provided regarding the importance of this vaccine. Patient has been advised to call insurance company to determine out of pocket expense if they have not yet received this vaccine. Advised may also receive vaccine at local pharmacy or Health Dept. Verbalized acceptance and understanding.  Screening Tests Health Maintenance  Topic Date Due   COVID-19 Vaccine (1) Never done   Zoster Vaccines- Shingrix (1 of 2) Never done   OPHTHALMOLOGY EXAM  10/09/2020   INFLUENZA VACCINE  Never done   PNEUMOCOCCAL POLYSACCHARIDE VACCINE AGE 57-64 HIGH RISK  08/14/2021 (Originally 01/29/1963)   HEMOGLOBIN A1C  12/15/2020   PAP SMEAR-Modifier  04/22/2021   FOOT EXAM  08/14/2021   MAMMOGRAM  09/12/2022   TETANUS/TDAP  02/19/2026   COLONOSCOPY (Pts 45-59yr Insurance coverage will need to be confirmed)  04/05/2029   Hepatitis C Screening  Completed   HIV Screening  Completed   Pneumococcal Vaccine [id number] old  Aged Out   HPV VLake HolidayMaintenance Due  Topic Date Due   COVID-19 Vaccine (1) Never done   Zoster Vaccines- Shingrix (1 of 2) Never done   OPHTHALMOLOGY EXAM  10/09/2020   INFLUENZA VACCINE  Never done    Colorectal cancer screening: Type of screening:  Colonoscopy. Completed 04/06/19. Repeat every 10 years  Mammogram status: Completed 6-20/22. Repeat every year    Lung Cancer Screening: (Low Dose CT Chest recommended if Age 484-80years, 30 pack-year currently smoking OR have quit w/in 15years.) does not qualify.   Lung Cancer Screening Referral: N/a  Additional Screening:  Hepatitis C Screening: does qualify; Completed 02/20/2016  Vision Screening: Recommended annual ophthalmology exams for early detection of glaucoma and other disorders of the eye. Is the patient up to date with their annual eye exam?  No  Who is the provider or what is the name of the office in which the patient attends annual eye exams? [facility] If pt is not established with a provider, would they like to be referred to a provider to establish care? No .   Dental Screening: Recommended annual dental exams for proper oral hygiene  Community Resource Referral / Chronic Care Management: CRR required this visit?  No   CCM required this visit?  No      Plan:     I have personally reviewed and noted the following in the patient's chart:   Medical and social history Use of alcohol, tobacco or illicit drugs  Current medications and supplements including opioid prescriptions. Patient is not currently taking opioid prescriptions. Functional ability and status Nutritional status Physical activity Advanced directives List of other physicians Hospitalizations, surgeries, and ER visits  in previous 12 months Vitals Screenings to include cognitive, depression, and falls Referrals and appointments  In addition, I have reviewed and discussed with patient certain preventive protocols, quality metrics, and best practice recommendations. A written personalized care plan for preventive services as well as general preventive health recommendations were provided to patient.     Quintella Baton, [facility]   12/06/2020   Nurse Notes: Non Face to Face Visit 60  minutes. [name] , Thank you for taking time to come for your Medicare Wellness Visit. I appreciate your ongoing commitment to your health goals. Please review the following plan we discussed and let me know if I can assist you in the future.   These are the goals we discussed:  Goals   None     This is a list of the screening recommended for you and due dates:  Health Maintenance  Topic Date Due   COVID-19 Vaccine (1) Never done   Zoster (Shingles) Vaccine (1 of 2) Never done   Eye exam for diabetics  10/09/2020   Flu Shot  Never done   Pneumococcal vaccine  08/14/2021*   Hemoglobin A1C  12/15/2020   Pap Smear  04/22/2021   Complete foot exam   08/14/2021   Mammogram  09/12/2022   Tetanus Vaccine  02/19/2026   Colon Cancer Screening  04/05/2029   Hepatitis C Screening: USPSTF Recommendation to screen - Ages 18-79 yo.  Completed   HIV Screening  Completed   Pneumococcal Vaccination  Aged Out   HPV Vaccine  Aged Out  *Topic was postponed. The date shown is not the original due date.

## 2020-12-18 ENCOUNTER — Ambulatory Visit: Payer: Medicare Other | Attending: Family Medicine | Admitting: Family Medicine

## 2020-12-18 ENCOUNTER — Encounter: Payer: Self-pay | Admitting: Family Medicine

## 2020-12-18 ENCOUNTER — Other Ambulatory Visit: Payer: Self-pay

## 2020-12-18 VITALS — BP 164/72 | HR 107 | Resp 16 | Wt 302.2 lb

## 2020-12-18 DIAGNOSIS — J302 Other seasonal allergic rhinitis: Secondary | ICD-10-CM | POA: Diagnosis not present

## 2020-12-18 DIAGNOSIS — I10 Essential (primary) hypertension: Secondary | ICD-10-CM

## 2020-12-18 DIAGNOSIS — G8929 Other chronic pain: Secondary | ICD-10-CM

## 2020-12-18 DIAGNOSIS — I152 Hypertension secondary to endocrine disorders: Secondary | ICD-10-CM

## 2020-12-18 DIAGNOSIS — E1159 Type 2 diabetes mellitus with other circulatory complications: Secondary | ICD-10-CM

## 2020-12-18 DIAGNOSIS — M25562 Pain in left knee: Secondary | ICD-10-CM | POA: Diagnosis not present

## 2020-12-18 DIAGNOSIS — M25561 Pain in right knee: Secondary | ICD-10-CM

## 2020-12-18 LAB — GLUCOSE, POCT (MANUAL RESULT ENTRY): POC Glucose: 235 mg/dl — AB (ref 70–99)

## 2020-12-18 LAB — POCT GLYCOSYLATED HEMOGLOBIN (HGB A1C): HbA1c, POC (controlled diabetic range): 7.5 % — AB (ref 0.0–7.0)

## 2020-12-18 MED ORDER — DAPAGLIFLOZIN PROPANEDIOL 5 MG PO TABS
5.0000 mg | ORAL_TABLET | Freq: Every day | ORAL | 3 refills | Status: DC
  Filled 2020-12-18: qty 30, 30d supply, fill #0
  Filled 2021-01-24: qty 30, 30d supply, fill #1
  Filled 2021-03-13: qty 30, 30d supply, fill #2
  Filled 2021-04-20: qty 30, 30d supply, fill #0

## 2020-12-18 MED ORDER — LISINOPRIL 20 MG PO TABS
20.0000 mg | ORAL_TABLET | Freq: Every day | ORAL | 6 refills | Status: DC
  Filled 2020-12-18: qty 30, 30d supply, fill #0
  Filled 2021-03-13: qty 30, 30d supply, fill #1
  Filled 2021-04-16: qty 30, 30d supply, fill #0
  Filled 2021-04-16: qty 30, 30d supply, fill #2
  Filled 2021-05-14 – 2021-05-22 (×2): qty 30, 30d supply, fill #1
  Filled 2021-07-01: qty 30, 30d supply, fill #2

## 2020-12-18 NOTE — Progress Notes (Signed)
Subjective:  Patient ID: [name], female    DOB: May 30, 1960  Age: 60 y.o. MRN: [id number]  CC: Diabetes and Medication Refill   HPI [name] is a 60 y.o. 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  Interval History: She has arthritis in her knees; R>L and she is scared of falling.  Pain is worse with the rain, cold weather.  Denies presence of swelling in her knees and ambulates with the aid of a cane.  Currently using Voltaren gel and methocarbamol.  She is is concenred she has been unable to loose weight and is planning on using a combination of vinegar, turmeric and Samin Milke 1 to help curb her appetite.  A1c 7.5 up from 6.4 previously and she has been compliant with metformin.  Denies presence of numbness in extremities, visual concerns. Her blood pressure is elevated and she endorses compliance with her antihypertensive.  Also doing well on her statin. Past Medical History:  Diagnosis Date   Headache    Hypertension    Iron deficiency anemia    Obesity    Seizures ([place])    Stroke ([place])    Type 2 diabetes mellitus ([place])     Past Surgical History:  Procedure Laterality Date   CARDIAC CATHETERIZATION  04/2010   Dr. Terrence Dupont   COLONOSCOPY WITH PROPOFOL N/A 04/06/2019   Procedure: COLONOSCOPY WITH PROPOFOL;  Surgeon: Mauri Pole, MD;  Location: [facility];  Service: Endoscopy;  Laterality: N/A;   POLYPECTOMY  04/06/2019   Procedure: POLYPECTOMY;  Surgeon: Mauri Pole, MD;  Location: [facility];  Service: Endoscopy;;    Family History  Problem Relation Age of Onset   Diabetes Father    Hypertension Father    Hypertension Sister    Stroke Brother     Allergies  Allergen Reactions   Bee Venom Swelling    Facial swelling   Eggs Or Egg-Derived Products Nausea And  Vomiting and Swelling    Mouth swelled   Gabapentin Other (See Comments)    Jittery feeling, dyspnea, crawling sensation   Gadolinium Derivatives Itching    Immediately after gad injection pt. complained of tongue numbness and lower lip/ then had rt side of body tingling/ pt. Was assessed by Dr. Jeralyn Ruths before leaving/ no medications were administered/    Outpatient Medications Prior to Visit  Medication Sig Dispense Refill   Accu-Chek FastClix Lancets MISC Check blood sugar up to 3 times daily. E11.59 102 each 6   acetaminophen (TYLENOL) 500 MG tablet Take 500-1,000 mg by mouth every 6 (six) hours as needed for mild pain or headache.      atorvastatin (LIPITOR) 20 MG tablet TAKE 1 TABLET (20 MG TOTAL) BY MOUTH DAILY. 30 tablet 6   Blood Glucose Monitoring Suppl (ACCU-CHEK GUIDE ME) w/Device KIT 1 kit by Does not apply route daily. Check blood sugar up to 3 times daily. E11.59 1 kit 0   cetirizine (ZYRTEC) 10 MG tablet Take 10 mg by mouth daily as needed for allergies.      Colchicine (MITIGARE) 0.6 MG CAPS Take 1 capsule by mouth 2 (two) times daily as needed. 60 capsule 3   colchicine 0.6 MG tablet Take by mouth 2 (two) times daily as needed.     diclofenac (VOLTAREN) 75 MG EC tablet TAKE 1 TABLET (75 MG TOTAL) BY MOUTH  2 (TWO) TIMES DAILY. 60 tablet 1   diclofenac Sodium (VOLTAREN) 1 % GEL Apply 1 application topically 4 (four) times daily as needed (pain.). OTC     diphenhydrAMINE (BENADRYL) 25 MG tablet Take 25 mg by mouth at bedtime.     fluticasone (FLONASE) 50 MCG/ACT nasal spray PLACE 1 SPRAY INTO BOTH NOSTRILS DAILY AS NEEDED FOR ALLERGIES OR RHINITIS. 16 g 6   glucose blood (ACCU-CHEK GUIDE) test strip Check blood sugar up to 3 times daily. E11.59 100 each 6   hydroxypropyl methylcellulose / hypromellose (ISOPTO TEARS / GONIOVISC) 2.5 % ophthalmic solution Place 2 drops into both eyes as needed (irritation). (Patient taking differently: Place 2 drops into both eyes 4 (four) times daily  as needed (irritation).) 15 mL 1   levETIRAcetam (KEPPRA) 500 MG tablet TAKE 1 & 1/2 TABLETS BY MOUTH TWICE DAILY. (Patient taking differently: Take 750 mg by mouth 2 (two) times daily.) 90 tablet 8   lidocaine (LIDODERM) 5 % Place 1 patch onto the skin daily. Remove & Discard patch within 12 hours or as directed by MD 30 patch 0   meclizine (ANTIVERT) 25 MG tablet TAKE 1 TABLET (25 MG TOTAL) BY MOUTH 2 (TWO) TIMES DAILY AS NEEDED FOR DIZZINESS. 60 tablet 2   metFORMIN (GLUCOPHAGE-XR) 500 MG 24 hr tablet TAKE 2 TABLETS (1,000 MG TOTAL) BY MOUTH 2 (TWO) TIMES DAILY. 120 tablet 6   methocarbamol (ROBAXIN) 750 MG tablet TAKE 1 TABLET (750 MG TOTAL) BY MOUTH IN THE MORNING AND AT BEDTIME. 60 tablet 5   metoprolol tartrate (LOPRESSOR) 100 MG tablet TAKE 1 TABLET (100 MG TOTAL) BY MOUTH 2 (TWO) TIMES DAILY. 60 tablet 6   lisinopril (ZESTRIL) 10 MG tablet TAKE 1 TABLET (10 MG TOTAL) BY MOUTH DAILY. 30 tablet 6   cephALEXin (KEFLEX) 500 MG capsule TAKE 1 CAPSULE (500 MG TOTAL) BY MOUTH 4 (FOUR) TIMES DAILY FOR 7 DAYS. (Patient not taking: Reported on 12/18/2020) 28 capsule 0   glipiZIDE (GLUCOTROL) 5 MG tablet TAKE 1/2 TABLETS BY MOUTH 2 TIMES DAILY BEFORE A MEAL. (Patient taking differently: Take 2.5 mg by mouth 2 (two) times daily before a meal.) 30 tablet 6   hydrocortisone (ANUSOL-HC) 25 MG suppository Place 1 suppository (25 mg total) rectally 2 (two) times daily. (Patient not taking: Reported on 12/18/2020) 12 suppository 0   Misc. Devices MISC Overnight pulse oximetry test.  Diagnosis obstructive sleep apnea (Patient not taking: Reported on 12/18/2020) 1 each 0   Misc. Devices MISC Discontinue Oxygen. Respiration- room air (Patient not taking: Reported on 12/18/2020) 1 each 0   No facility-administered medications prior to visit.     ROS Review of Systems  Constitutional:  Negative for activity change, appetite change and fatigue.  HENT:  Negative for congestion, sinus pressure and sore throat.    Eyes:  Negative for visual disturbance.  Respiratory:  Negative for cough, chest tightness, shortness of breath and wheezing.   Cardiovascular:  Negative for chest pain and palpitations.  Gastrointestinal:  Negative for abdominal distention, abdominal pain and constipation.  Endocrine: Negative for polydipsia.  Genitourinary:  Negative for dysuria and frequency.  Musculoskeletal:  Negative for arthralgias and back pain.  Skin:  Negative for rash.  Neurological:  Negative for tremors, light-headedness and numbness.  Hematological:  Does not bruise/bleed easily.  Psychiatric/Behavioral:  Negative for agitation and behavioral problems.    Objective:  BP (!) 164/72   Pulse (!) 107   Resp 16   Wt (!) 302 lb  3.2 oz (137.1 kg)   LMP 02/17/2011 (Exact Date)   SpO2 100%   BMI 61.04 kg/m   BP/Weight 12/18/2020 08/24/2020 2/44/0102  Systolic BP 725 366 440  Diastolic BP 72 99 77  Wt. (Lbs) 302.2 307 303  BMI 61.04 62.01 61.2      Physical Exam Constitutional:      Appearance: She is well-developed.  Cardiovascular:     Rate and Rhythm: Tachycardia present.     Heart sounds: Normal heart sounds. No murmur heard. Pulmonary:     Effort: Pulmonary effort is normal.     Breath sounds: Normal breath sounds. No wheezing or rales.  Chest:     Chest wall: No tenderness.  Abdominal:     General: Bowel sounds are normal. There is no distension.     Palpations: Abdomen is soft. There is no mass.     Tenderness: There is no abdominal tenderness.  Musculoskeletal:        General: Normal range of motion.     Right lower leg: No edema.     Left lower leg: No edema.  Neurological:     Mental Status: She is alert and oriented to person, place, and time.  Psychiatric:        Mood and Affect: Mood normal.    CMP Latest Ref Rng & Units 06/14/2020 03/12/2020 11/16/2019  Glucose 65 - 99 mg/dL 135(H) 111(H) 158(H)  BUN 6 - 24 mg/dL 15 15 41(H)  Creatinine 0.57 - 1.00 mg/dL 0.92 1.09(H) 1.06(H)   Sodium 134 - 144 mmol/L 143 138 137  Potassium 3.5 - 5.2 mmol/L 3.7 3.2(L) 3.8  Chloride 96 - 106 mmol/L 102 97(L) 95(L)  CO2 20 - 29 mmol/L _0 Calcium 8.7 - 10.2 mg/dL 9.4 9.1 8.9  Total Protein 6.5 - 8.1 g/dL - 8.9(H) -  Total Bilirubin 0.3 - 1.2 mg/dL - 0.5 -  Alkaline Phos 38 - 126 U/L - 61 -  AST 15 - 41 U/L - 14(L) -  ALT 0 - 44 U/L - 12 -    Lipid Panel     Component Value Date/Time   CHOL 110 06/14/2020 0911   TRIG 98 06/14/2020 0911   HDL 43 06/14/2020 0911   CHOLHDL 2.6 06/14/2020 0911   CHOLHDL 2.7 10/08/2015 0541   VLDL 12 10/08/2015 0541   LDLCALC 48 06/14/2020 0911    CBC    Component Value Date/Time   WBC 11.4 (H) 03/12/2020 1829   RBC 5.17 (H) 03/12/2020 1829   HGB 12.1 03/12/2020 1829   HGB 11.5 09/10/2016 1109   HCT 40.8 03/12/2020 1829   HCT 36.4 09/10/2016 1109   PLT 311 03/12/2020 1829   PLT 341 09/10/2016 1109   MCV 78.9 (L) 03/12/2020 1829   MCV 77 (L) 09/10/2016 1109   MCH 23.4 (L) 03/12/2020 1829   MCHC 29.7 (L) 03/12/2020 1829   RDW 14.8 03/12/2020 1829   RDW 15.5 (H) 09/10/2016 1109   LYMPHSABS 2.8 03/12/2020 1829   LYMPHSABS 3.0 09/10/2016 1109   MONOABS 1.0 03/12/2020 1829   EOSABS 0.1 03/12/2020 1829   EOSABS 0.2 09/10/2016 1109   BASOSABS 0.1 03/12/2020 1829   BASOSABS 0.0 09/10/2016 1109    Lab Results  Component Value Date   HGBA1C 7.5 (A) 12/18/2020    Assessment & Plan:  1. Type 2 diabetes mellitus with other circulatory complication, without long-term current use of insulin (HCC) A1c of 7.5 which has trended up from 6.4 previously;  goal is less than 7.0 Farxiga added to regimen Would love to add a GLP-1 for additional weight loss benefit and cardiovascular benefit however she declines any injectable Counseled on Diabetic diet, my plate method, 315 minutes of moderate intensity exercise/week Blood sugar logs with fasting goals of 80-120 mg/dl, random of less than 180 and in the event of sugars less than 60 mg/dl  or greater than 400 mg/dl encouraged to notify the clinic. Advised on the need for annual eye exams, annual foot exams, Pneumonia vaccine. - POCT glucose (manual entry) - POCT glycosylated hemoglobin (Hb A1C) - dapagliflozin propanediol (FARXIGA) 5 MG TABS tablet; Take 1 tablet (5 mg total) by mouth daily before breakfast.  Dispense: 30 tablet; Refill: 3  2. Seasonal allergies Stable Continue antihistamine  3. Chronic pain of both knees Uncontrolled Would love to refer to orthopedics for cortisone injection however she declines Symptoms are stable on diclofenac gel and methocarbamol - Ambulatory referral to Physical Therapy  4. Hypertension associated with diabetes ([place]) Uncontrolled Dose of lisinopril increased She will follow-up with the clinical pharmacist in 2 weeks to have her blood pressure reassessed and also for a BMP to check her potassium Counseled on blood pressure goal of less than 130/80, low-sodium, DASH diet, medication compliance, 150 minutes of moderate intensity exercise per week. Discussed medication compliance, adverse effects. - lisinopril (ZESTRIL) 20 MG tablet; Take 1 tablet (20 mg total) by mouth daily.  Dispense: 30 tablet; Refill: 6   Meds ordered this encounter  Medications   lisinopril (ZESTRIL) 20 MG tablet    Sig: Take 1 tablet (20 mg total) by mouth daily.    Dispense:  30 tablet    Refill:  6    Dose increase     Follow-up: Return in about 2 weeks (around 01/01/2021) for [facility] for BP follow up and labs; PCP 3 months.       Charlott Rakes, MD, FAAFP. [facility] [place], [place]   12/18/2020, 2:36 PM

## 2020-12-18 NOTE — Patient Instructions (Signed)
Managing Your Hypertension Hypertension, also called high blood pressure, is when the force of the blood pressing against the walls of the arteries is too strong. Arteries are blood vessels that carry blood from your heart throughout your body. Hypertension forces the heart to work harder to pump blood and may cause the arteries tobecome narrow or stiff. Understanding blood pressure readings Your personal target blood pressure may vary depending on your medical conditions, your age, and other factors. A blood pressure reading includes a higher number over a lower number. Ideally, your blood pressure should be below 120/80. You should know that: The first, or top, number is called the systolic pressure. It is a measure of the pressure in your arteries as your heart beats. The second, or bottom number, is called the diastolic pressure. It is a measure of the pressure in your arteries as the heart relaxes. Blood pressure is classified into four stages. Based on your blood pressure reading, your health care provider may use the following stages to determine what type of treatment you need, if any. Systolic pressure and diastolicpressure are measured in a unit called mmHg. Normal Systolic pressure: below 120. Diastolic pressure: below 80. Elevated Systolic pressure: 120-129. Diastolic pressure: below 80. Hypertension stage 1 Systolic pressure: 130-139. Diastolic pressure: 80-89. Hypertension stage 2 Systolic pressure: 140 or above. Diastolic pressure: 90 or above. How can this condition affect me? Managing your hypertension is an important responsibility. Over time, hypertension can damage the arteries and decrease blood flow to important parts of the body, including the brain, heart, and kidneys. Having untreated or uncontrolled hypertension can lead to: A heart attack. A stroke. A weakened blood vessel (aneurysm). Heart failure. Kidney damage. Eye damage. Metabolic syndrome. Memory and  concentration problems. Vascular dementia. What actions can I take to manage this condition? Hypertension can be managed by making lifestyle changes and possibly by taking medicines. Your health care provider will help you make a plan to bring yourblood pressure within a normal range. Nutrition  Eat a diet that is high in fiber and potassium, and low in salt (sodium), added sugar, and fat. An example eating plan is called the Dietary Approaches to Stop Hypertension (DASH) diet. To eat this way: Eat plenty of fresh fruits and vegetables. Try to fill one-half of your plate at each meal with fruits and vegetables. Eat whole grains, such as whole-wheat pasta, brown rice, or whole-grain bread. Fill about one-fourth of your plate with whole grains. Eat low-fat dairy products. Avoid fatty cuts of meat, processed or cured meats, and poultry with skin. Fill about one-fourth of your plate with lean proteins such as fish, chicken without skin, beans, eggs, and tofu. Avoid pre-made and processed foods. These tend to be higher in sodium, added sugar, and fat. Reduce your daily sodium intake. Most people with hypertension should eat less than 1,500 mg of sodium a day.  Lifestyle  Work with your health care provider to maintain a healthy body weight or to lose weight. Ask what an ideal weight is for you. Get at least 30 minutes of exercise that causes your heart to beat faster (aerobic exercise) most days of the week. Activities may include walking, swimming, or biking. Include exercise to strengthen your muscles (resistance exercise), such as weight lifting, as part of your weekly exercise routine. Try to do these types of exercises for 30 minutes at least 3 days a week. Do not use any products that contain nicotine or tobacco, such as cigarettes, e-cigarettes, and chewing   tobacco. If you need help quitting, ask your health care provider. Control any long-term (chronic) conditions you have, such as high  cholesterol or diabetes. Identify your sources of stress and find ways to manage stress. This may include meditation, deep breathing, or making time for fun activities.  Alcohol use Do not drink alcohol if: Your health care provider tells you not to drink. You are pregnant, may be pregnant, or are planning to become pregnant. If you drink alcohol: Limit how much you use to: 0-1 drink a day for women. 0-2 drinks a day for men. Be aware of how much alcohol is in your drink. In the [place] one drink equals one 12 oz bottle of beer (355 mL), one 5 oz glass of wine (148 mL), or one 1 oz glass of hard liquor (44 mL). Medicines Your health care provider may prescribe medicine if lifestyle changes are not enough to get your blood pressure under control and if: Your systolic blood pressure is 130 or higher. Your diastolic blood pressure is 80 or higher. Take medicines only as told by your health care provider. Follow the directions carefully. Blood pressure medicines must be taken as told by your health care provider. The medicine does not work as well when you skip doses. Skippingdoses also puts you at risk for problems. Monitoring Before you monitor your blood pressure: Do not smoke, drink caffeinated beverages, or exercise within 30 minutes before taking a measurement. Use the bathroom and empty your bladder (urinate). Sit quietly for at least 5 minutes before taking measurements. Monitor your blood pressure at home as told by your health care provider. To do this: Sit with your back straight and supported. Place your feet flat on the floor. Do not cross your legs. Support your arm on a flat surface, such as a table. Make sure your upper arm is at heart level. Each time you measure, take two or three readings one minute apart and record the results. You may also need to have your blood pressure checked regularly by your healthcare provider. General information Talk with your health care  provider about your diet, exercise habits, and other lifestyle factors that may be contributing to hypertension. Review all the medicines you take with your health care provider because there may be side effects or interactions. Keep all visits as told by your health care provider. Your health care provider can help you create and adjust your plan for managing your high blood pressure. Where to find more information National Heart, Lung, and Blood Institute: www.nhlbi.nih.gov American Heart Association: www.heart.org Contact a health care provider if: You think you are having a reaction to medicines you have taken. You have repeated (recurrent) headaches. You feel dizzy. You have swelling in your ankles. You have trouble with your vision. Get help right away if: You develop a severe headache or confusion. You have unusual weakness or numbness, or you feel faint. You have severe pain in your chest or abdomen. You vomit repeatedly. You have trouble breathing. These symptoms may represent a serious problem that is an emergency. Do not wait to see if the symptoms will go away. Get medical help right away. Call your local emergency services (911 in the [place] Do not drive yourself to the hospital. Summary Hypertension is when the force of blood pumping through your arteries is too strong. If this condition is not controlled, it may put you at risk for serious complications. Your personal target blood pressure may vary depending on your medical conditions,   your age, and other factors. For most people, a normal blood pressure is less than 120/80. Hypertension is managed by lifestyle changes, medicines, or both. Lifestyle changes to help manage hypertension include losing weight, eating a healthy, low-sodium diet, exercising more, stopping smoking, and limiting alcohol. This information is not intended to replace advice given to you by your health care provider. Make sure you discuss any questions  you have with your healthcare provider. Document Revised: 04/16/2019 Document Reviewed: 02/09/2019 Elsevier Patient Education  2022 [employer]  

## 2020-12-27 ENCOUNTER — Encounter: Payer: Self-pay | Admitting: Physical Therapy

## 2020-12-27 ENCOUNTER — Other Ambulatory Visit: Payer: Self-pay

## 2020-12-27 ENCOUNTER — Ambulatory Visit: Payer: Medicare Other | Attending: Family Medicine | Admitting: Physical Therapy

## 2020-12-27 DIAGNOSIS — R2689 Other abnormalities of gait and mobility: Secondary | ICD-10-CM | POA: Insufficient documentation

## 2020-12-27 DIAGNOSIS — M6281 Muscle weakness (generalized): Secondary | ICD-10-CM | POA: Insufficient documentation

## 2020-12-27 DIAGNOSIS — M25561 Pain in right knee: Secondary | ICD-10-CM | POA: Insufficient documentation

## 2020-12-27 DIAGNOSIS — M25562 Pain in left knee: Secondary | ICD-10-CM | POA: Diagnosis not present

## 2020-12-27 DIAGNOSIS — G8929 Other chronic pain: Secondary | ICD-10-CM | POA: Insufficient documentation

## 2020-12-27 NOTE — Therapy (Signed)
[place], [place], [address] Phone: [phone]   Fax:  [phone]  Physical Therapy Evaluation  Patient Details  Name: [name] MRN: [id number] Date of Birth: 1960-11-05 Referring Provider (PT): Charlott Rakes, MD  Encounter Date: 12/27/2020   PT End of Session - 12/27/20 1510     Visit Number 1    Number of Visits 16    Date for PT Re-Evaluation 02/21/21    Authorization Type UHC MCR    PT Start Time 1300    PT Stop Time 1345    PT Time Calculation (min) 45 min             Past Medical History:  Diagnosis Date   Headache    Hypertension    Iron deficiency anemia    Obesity    Seizures ([place])    Stroke ([place])    Type 2 diabetes mellitus ([place])     Past Surgical History:  Procedure Laterality Date   CARDIAC CATHETERIZATION  04/2010   Dr. Terrence Dupont   COLONOSCOPY WITH PROPOFOL N/A 04/06/2019   Procedure: COLONOSCOPY WITH PROPOFOL;  Surgeon: Mauri Pole, MD;  [facility];  Service: Endoscopy;  Laterality: N/A;   POLYPECTOMY  04/06/2019   Procedure: POLYPECTOMY;  Surgeon: Mauri Pole, MD;  [facility];  Service: Endoscopy;;    There were no vitals filed for this visit.    Subjective Assessment - 12/27/20 1508     Subjective [name] is a 60 y.o. female who presents to clinic with chief complaint of bil knee pain which makes her feel like she may fall.  MOI/History of condition: Chronic knee pain worsening over the last several years.  She thinks it may be d/t weight gain.  She is seeing a nutritionist but drinks soda frequently.  Pain location: anterior joint line, bil knees R>L.  Red flags: denies numbness.  48 hour pain intensity:  highest "12"/10, current 0/10, best 0/10.  Aggs: walking (immediate) (30'), cold.  Eases: rest.  Nature: throbbing, shooting.  Severity: high.  Irritability: mod.  Stage: chronic.  Stability: getting worse.  24 hour  pattern: worst in the morning, then worsening with activity.  Vocation/requirements: NA.  Hobbies: NA.  Functional limitations/goals: walk 10-15 min, reduce knee pain.  Home environment: lives with husband and brother, 5 STE (with railing), single story house.  Assistive device: SPC, has rollator.   Hand dominance: L.  Falls: no, has fear of falling.    Pertinent History Significant PMH: hx of stroke (2017), DM TII, anemia                [facility] PT Assessment - 12/27/20 0001       Assessment   Medical Diagnosis Referral diagnosis: Chronic pain of both knees (M25.561, M25.562, G89.29)    Referring Provider (PT) Charlott Rakes, MD    Onset Date/Surgical Date 12/28/18    Hand Dominance Left      Precautions   Precaution Comments Significant PMH: hx of stroke (2017), DM TII, anemia      Restrictions   Weight Bearing Restrictions No      Balance Screen   Has the patient fallen in the past 6 months No      Prior Function   Level of Independence Independent      Observation/Other Assessments   Observations extreemely slow gait with L w/s, limited by knee pain and fatigue    Focus on Therapeutic Outcomes (  FOTO)  not taken      Functional Tests   Functional tests Sit to Stand;Other      Sit to Stand   Comments 30'' STS: 4x  UE used? Yes      Other:   Other/ Comments 24 feet max gait speed: 70'', .1 m/s, AD: yes, SPC      ROM / Strength   AROM / PROM / Strength AROM;Strength      AROM   AROM Assessment Site Knee    Right/Left Knee Right;Left    Right Knee Extension 0    Right Knee Flexion --   limited by soft tissue approximation   Left Knee Extension 0    Left Knee Flexion --   limited by soft tissue approximation     Strength   Overall Strength Comments LE strength grossly 3+/5 with exception of R knee flexion and ext which are 3/5      Special Tests   Other special tests no signs of ligamentous instability                        Objective  measurements completed on examination: See above findings.                PT Education - 12/27/20 1509     Education Details POC, diagnosis, prognosis, HEP.  Pt educated via explanation, demonstration, and handout (HEP).  Pt confirms understanding verbally.    Person(s) Educated Patient    Methods Handout    Comprehension Verbalized understanding              PT Short Term Goals - 12/27/20 1514       PT SHORT TERM GOAL #1   Title [name] will be >75% HEP compliant to improve carryover between sessions and facilitate independent management of condition    Target Date 01/17/21               PT Long Term Goals - 12/27/20 1514       PT LONG TERM GOAL #1   Title [name] will improve the following MMTs to >/= 4/5 to show improvement in strength:  R knee ext and flexion  EVAL: 3/5  target date: 02/21/2021      PT LONG TERM GOAL #2   Title [name] will improve 10 meter max gait speed to .2 m/s (.1 m/s MCID) to show functional improvement in ambulation  EVAL: .1 m/s  target date: 02/21/21      PT LONG TERM GOAL #3   Title [name] will improve 30'' STS (MCID 2) to >/= 6x to show improved LE strength and improved transfers  EVAL: 4x   target date: 02/21/21      PT LONG TERM GOAL #4   Title [name] will report >/= 50% decrease in pain from evaluation  EVAL: 10/10 max pain  target date: 02/21/21                    Plan - 12/27/20 1511     Clinical Impression Statement [name] is a 60 y.o. female who presents to clinic with signs and sxs consistent with bilateral knee pain R>L d/t degenerative changes associated with R sided weakness from chronic stroke and morbid obesity.  She is seeing a nutritionist, but continues to drink soda and juice.  Pt presents with pain and impairments/deficits in: knee strength, gait speed, LE  strength and endurance, general activity tolerance.  Activity limitations include:  transfers, walking, steps.  Participation limitations include: shopping, housework.  Pt will benefit from skilled therapy to address pain and the listed deficits in order to achieve functional goals, enable safety and independence in completion of daily tasks, and return to PLOF.    Stability/Clinical Decision Making Stable/Uncomplicated    Clinical Decision Making Low    Rehab Potential Fair    PT Frequency 2x / week    PT Duration 8 weeks    PT Treatment/Interventions ADLs/Self Care Home Management;Aquatic Therapy;Iontophoresis 4mg /ml Dexamethasone;Electrical Stimulation;Gait training;Therapeutic activities;Therapeutic exercise;Neuromuscular re-education;Manual techniques;Dry needling;Vasopneumatic Device;Joint Manipulations    PT Next Visit Plan Discuss high sugar foods and follow up with nutritionist, progressive R knee strengthening, increase general activity tolerance    PT Home Exercise Plan [place] and Agree with Plan of Care Patient             Patient will benefit from skilled therapeutic intervention in order to improve the following deficits and impairments:  Abnormal gait, Decreased balance, Difficulty walking, Obesity, Pain, Decreased strength, Decreased activity tolerance, Cardiopulmonary status limiting activity  Visit Diagnosis: Chronic pain of right knee  Chronic pain of left knee  Other abnormalities of gait and mobility  Balance problem  Muscle weakness     Problem List Patient Active Problem List   Diagnosis Date Noted   Gait disturbance, post-stroke 06/12/2020   Cellulitis of left lower extremity 03/14/2020   Leukocytosis 03/14/2020   Rotator cuff impingement syndrome, right 03/09/2020   Chronic right shoulder pain 03/09/2020   Acute hypoxemic respiratory failure due to COVID-19 ([place]) 11/10/2019   Urinary frequency 06/24/2019   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   BRBPR (bright red blood per rectum)    Polyp of cecum    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[place]) 12/08/2018   Empty sella ([place]) 02/24/2018   Morbid obesity due to excess calories ([place]) 02/05/2018   Abnormality of gait following cerebrovascular accident (CVA) 10/31/2017   History of stroke 07/16/2017   Headache 04/02/2017   Seizure disorder ([place]) 10/02/2016   Obstructive sleep apnea 11/29/2015   Right shoulder pain 11/29/2015   Adjustment disorder with depressed mood 10/19/2015   Nocturnal enuresis    Hypokalemia    Anemia, iron deficiency    Super obese    Controlled type 2 diabetes mellitus without complication (HCC)    Orthostatic hypotension    Supplemental oxygen dependent    Diastolic dysfunction    Tachypnea    AKI (acute kidney injury) ([place])    Acute blood loss anemia    Lethargy    Wheezing    Benign essential HTN    ICH (intracerebral hemorrhage) ([place]) 10/06/2015    Mathis Dad, PT 12/27/2020, 3:19 PM  [facility] [address] [place], [place], [address] Phone: [phone]   Fax:  [phone]  Name: [name] MRN: [id number] Date of Birth: 1961/01/25

## 2020-12-27 NOTE — Patient Instructions (Signed)
Access Code: [facility] URL: [email or web address] Date: 12/27/2020 Prepared by: Shearon Balo  Exercises Seated Knee Extension with Resistance - 2 x daily - 7 x weekly - 3 sets - 10 reps Seated Hamstring Curl with Anchored Resistance - 2 x daily - 7 x weekly - 3 sets - 10 reps

## 2021-01-03 ENCOUNTER — Other Ambulatory Visit: Payer: Self-pay

## 2021-01-03 ENCOUNTER — Ambulatory Visit: Payer: Medicare Other

## 2021-01-03 DIAGNOSIS — G8929 Other chronic pain: Secondary | ICD-10-CM

## 2021-01-03 DIAGNOSIS — M6281 Muscle weakness (generalized): Secondary | ICD-10-CM | POA: Diagnosis not present

## 2021-01-03 DIAGNOSIS — M25561 Pain in right knee: Secondary | ICD-10-CM | POA: Diagnosis not present

## 2021-01-03 DIAGNOSIS — M25562 Pain in left knee: Secondary | ICD-10-CM | POA: Diagnosis not present

## 2021-01-03 DIAGNOSIS — R2689 Other abnormalities of gait and mobility: Secondary | ICD-10-CM

## 2021-01-03 NOTE — Therapy (Signed)
[place], [place], [address] Phone: [phone]   Fax:  [phone]  Physical Therapy Treatment  Patient Details  Name: [name] MRN: [id number] Date of Birth: 31-Oct-1960 Referring Provider (PT): Charlott Rakes, MD   Encounter Date: 01/03/2021   PT End of Session - 01/03/21 1357     Visit Number 2    Number of Visits 16    Date for PT Re-Evaluation 02/21/21    Authorization Type UHC MCR    PT Start Time 6962    PT Stop Time 1439    PT Time Calculation (min) 42 min             Past Medical History:  Diagnosis Date   Headache    Hypertension    Iron deficiency anemia    Obesity    Seizures ([place])    Stroke ([place])    Type 2 diabetes mellitus ([place])     Past Surgical History:  Procedure Laterality Date   CARDIAC CATHETERIZATION  04/2010   Dr. Terrence Dupont   COLONOSCOPY WITH PROPOFOL N/A 04/06/2019   Procedure: COLONOSCOPY WITH PROPOFOL;  Surgeon: Mauri Pole, MD;  [facility];  Service: Endoscopy;  Laterality: N/A;   POLYPECTOMY  04/06/2019   Procedure: POLYPECTOMY;  Surgeon: Mauri Pole, MD;  [facility];  Service: Endoscopy;;    There were no vitals filed for this visit.   Subjective Assessment - 01/03/21 1357     Subjective Pt presents to PT with reports of bilateral knee pain. She states she tried being compliant with her HEP with no adverse effect. Pt says she has tried to get another appt with nutritionist, but she is nervouse to go until their parking lot is fixed. She is ready to begin PT at this time.    Currently in Pain? Yes    Pain Score 5     Pain Location Knee    Pain Orientation Right;Left           [facility] Adult PT Treatment/Exercise:   Therapeutic Exercise: NuStep lvl 3 UE/LE x 3 min while taking subjective LAQ 2x10 ea Seated hamstring curl 2x10 - red tband Supine ball squeeze 2x10 - 5 sec hold Supine clamshell 3x15 red  tband Seated march - feet on 4in platform x 20                             PT Education - 01/03/21 1436     Education Details HEP    Person(s) Educated Patient    Methods Explanation;Demonstration;Handout    Comprehension Verbalized understanding;Returned demonstration              PT Short Term Goals - 12/27/20 1514       PT SHORT TERM GOAL #1   Title [name] will be >75% HEP compliant to improve carryover between sessions and facilitate independent management of condition    Target Date 01/17/21               PT Long Term Goals - 12/27/20 1514       PT LONG TERM GOAL #1   Title JERSI MCMASTER will improve the following MMTs to >/= 4/5 to show improvement in strength:  R knee ext and flexion  EVAL: 3/5  target date: 02/21/2021      PT LONG TERM GOAL #2   Title LEXANI CORONA will improve 10  meter max gait speed to .2 m/s (.1 m/s MCID) to show functional improvement in ambulation  EVAL: .1 m/s  target date: 02/21/21      PT LONG TERM GOAL #3   Title SUMAYAH BEARSE will improve 30'' STS (MCID 2) to >/= 6x to show improved LE strength and improved transfers  EVAL: 4x   target date: 02/21/21      PT LONG TERM GOAL #4   Title KASSADIE PANCAKE will report >/= 50% decrease in pain from evaluation  EVAL: 10/10 max pain  target date: 02/21/21                   Plan - 01/03/21 1412     Clinical Impression Statement Pt able to complete prescribed exercises with no adverse effect or change in baseline. She continues to demonstrate decreased activity tolerance and general weakness, R>L. These deficits continue to limit her mobility and comfort. Today's session focused on improving strength and general activity tolerance in order to improve functional mobility. PT will continue to progress pt as tolerated per POC.    PT Treatment/Interventions ADLs/Self Care Home Management;Aquatic Therapy;Iontophoresis 4mg /ml  Dexamethasone;Electrical Stimulation;Gait training;Therapeutic activities;Therapeutic exercise;Neuromuscular re-education;Manual techniques;Dry needling;Vasopneumatic Device;Joint Manipulations    PT Next Visit Plan Discuss high sugar foods and follow up with nutritionist, progressive R knee strengthening, increase general activity tolerance    PT Home Exercise Plan [facility]             Patient will benefit from skilled therapeutic intervention in order to improve the following deficits and impairments:  Abnormal gait, Decreased balance, Difficulty walking, Obesity, Pain, Decreased strength, Decreased activity tolerance, Cardiopulmonary status limiting activity  Visit Diagnosis: Chronic pain of right knee  Chronic pain of left knee  Other abnormalities of gait and mobility  Balance problem  Muscle weakness     Problem List Patient Active Problem List   Diagnosis Date Noted   Gait disturbance, post-stroke 06/12/2020   Cellulitis of left lower extremity 03/14/2020   Leukocytosis 03/14/2020   Rotator cuff impingement syndrome, right 03/09/2020   Chronic right shoulder pain 03/09/2020   Acute hypoxemic respiratory failure due to COVID-19 ([place]) 11/10/2019   Urinary frequency 06/24/2019   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   BRBPR (bright red blood per rectum)    Polyp of cecum    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[place]) 12/08/2018   Empty sella ([place]) 02/24/2018   Morbid obesity due to excess calories ([place]) 02/05/2018   Abnormality of gait following cerebrovascular accident (CVA) 10/31/2017   History of stroke 07/16/2017   Headache 04/02/2017   Seizure disorder ([place]) 10/02/2016   Obstructive sleep apnea 11/29/2015   Right shoulder pain 11/29/2015   Adjustment disorder with depressed mood 10/19/2015   Nocturnal enuresis    Hypokalemia    Anemia, iron deficiency    Super obese    Controlled type 2 diabetes mellitus without complication (HCC)     Orthostatic hypotension    Supplemental oxygen dependent    Diastolic dysfunction    Tachypnea    AKI (acute kidney injury) ([place])    Acute blood loss anemia    Lethargy    Wheezing    Benign essential HTN    ICH (intracerebral hemorrhage) ([place]) 10/06/2015    Ward Chatters, PT 01/03/2021, 2:40 PM  [facility] [address] [place], [place], [address] Phone: [phone]   Fax:  [phone]  Name: [name] MRN: [id number] Date of  Birth: [birth year]

## 2021-01-09 ENCOUNTER — Encounter: Payer: Self-pay | Admitting: Physical Therapy

## 2021-01-09 ENCOUNTER — Other Ambulatory Visit: Payer: Self-pay

## 2021-01-09 ENCOUNTER — Ambulatory Visit: Payer: Medicare Other | Admitting: Physical Therapy

## 2021-01-09 DIAGNOSIS — M25561 Pain in right knee: Secondary | ICD-10-CM

## 2021-01-09 DIAGNOSIS — G8929 Other chronic pain: Secondary | ICD-10-CM | POA: Diagnosis not present

## 2021-01-09 DIAGNOSIS — M25562 Pain in left knee: Secondary | ICD-10-CM | POA: Diagnosis not present

## 2021-01-09 DIAGNOSIS — M6281 Muscle weakness (generalized): Secondary | ICD-10-CM

## 2021-01-09 DIAGNOSIS — R2689 Other abnormalities of gait and mobility: Secondary | ICD-10-CM | POA: Diagnosis not present

## 2021-01-09 NOTE — Therapy (Signed)
[place], [place], [address] Phone: [phone]   Fax:  [phone]  Physical Therapy Treatment  Patient Details  Name: DILCIA RYBARCZYK MRN: [id number] Date of Birth: 1960-10-29 Referring Provider (PT): Charlott Rakes, MD   Encounter Date: 01/09/2021   PT End of Session - 01/09/21 1530     Visit Number 3    Number of Visits 16    Date for PT Re-Evaluation 02/21/21    Authorization Type UHC MCR    PT Start Time 7673    PT Stop Time 4193    PT Time Calculation (min) 45 min             Past Medical History:  Diagnosis Date   Headache    Hypertension    Iron deficiency anemia    Obesity    Seizures ([place])    Stroke ([place])    Type 2 diabetes mellitus ([place])     Past Surgical History:  Procedure Laterality Date   CARDIAC CATHETERIZATION  04/2010   Dr. Terrence Dupont   COLONOSCOPY WITH PROPOFOL N/A 04/06/2019   Procedure: COLONOSCOPY WITH PROPOFOL;  Surgeon: Mauri Pole, MD;  [facility];  Service: Endoscopy;  Laterality: N/A;   POLYPECTOMY  04/06/2019   Procedure: POLYPECTOMY;  Surgeon: Mauri Pole, MD;  [facility];  Service: Endoscopy;;    There were no vitals filed for this visit.   Subjective Assessment - 01/09/21 1537     Subjective Pt reports that she has been trying to complete her HEP.  She reports that she is still having knee pain, but it might be getting a little better.  5/10 knee pain R>L.    Pertinent History Significant PMH: hx of stroke (2017), DM TII, anemia             [facility] Adult PT Treatment/Exercise:   Therapeutic Exercise: NuStep lvl 4 UE/LE x 5 min while taking subjective LAQ 3x10 ea - 5# -  Seated hamstring curl 3x10 - green tband Seated ball squeeze 2x10 - 5 sec hold Seated clamshell 3x15 Green tband Seated march - feet on 4in platform 4x10 GTB Ambulation 100' for endurance    PT Short Term Goals - 12/27/20 1514       PT  SHORT TERM GOAL #1   Title NORINNE JEANE will be >75% HEP compliant to improve carryover between sessions and facilitate independent management of condition    Target Date 01/17/21               PT Long Term Goals - 12/27/20 1514       PT LONG TERM GOAL #1   Title KALILAH BARUA will improve the following MMTs to >/= 4/5 to show improvement in strength:  R knee ext and flexion  EVAL: 3/5  target date: 02/21/2021      PT LONG TERM GOAL #2   Title KENNEISHA COCHRANE will improve 10 meter max gait speed to .2 m/s (.1 m/s MCID) to show functional improvement in ambulation  EVAL: .1 m/s  target date: 02/21/21      PT LONG TERM GOAL #3   Title LOUANNA VANLIEW will improve 30'' STS (MCID 2) to >/= 6x to show improved LE strength and improved transfers  EVAL: 4x   target date: 02/21/21      PT LONG TERM GOAL #4   Title ESTELLA MALATESTA will report >/= 50% decrease in pain from evaluation  EVAL: 10/10 max pain  target date: 02/21/21                   Plan - 01/09/21 1601     Clinical Impression Statement Pt reports no increase in baseline pain following therapy  HEP was reviewed, but left unchanged    Overall, HADASSA CERMAK is progressing fair with therapy.  Today we concentrated on quad strengthening and hip strengthening.  Pt progressing well with exercises.  She is able to tolerate significantly higher exercise volume and intensity today.  Pt will continue to benefit from skilled physical therapy to address remaining deficits and achieve listed goals.  Continue per POC.    PT Treatment/Interventions ADLs/Self Care Home Management;Aquatic Therapy;Iontophoresis 4mg /ml Dexamethasone;Electrical Stimulation;Gait training;Therapeutic activities;Therapeutic exercise;Neuromuscular re-education;Manual techniques;Dry needling;Vasopneumatic Device;Joint Manipulations    PT Next Visit Plan Discuss high sugar foods and follow up with nutritionist, progressive R knee  strengthening, increase general activity tolerance    PT Home Exercise Plan [facility]             Patient will benefit from skilled therapeutic intervention in order to improve the following deficits and impairments:  Abnormal gait, Decreased balance, Difficulty walking, Obesity, Pain, Decreased strength, Decreased activity tolerance, Cardiopulmonary status limiting activity  Visit Diagnosis: Chronic pain of right knee  Chronic pain of left knee  Other abnormalities of gait and mobility  Balance problem  Muscle weakness     Problem List Patient Active Problem List   Diagnosis Date Noted   Gait disturbance, post-stroke 06/12/2020   Cellulitis of left lower extremity 03/14/2020   Leukocytosis 03/14/2020   Rotator cuff impingement syndrome, right 03/09/2020   Chronic right shoulder pain 03/09/2020   Acute hypoxemic respiratory failure due to COVID-19 ([place]) 11/10/2019   Urinary frequency 06/24/2019   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   BRBPR (bright red blood per rectum)    Polyp of cecum    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[place]) 12/08/2018   Empty sella ([place]) 02/24/2018   Morbid obesity due to excess calories ([place]) 02/05/2018   Abnormality of gait following cerebrovascular accident (CVA) 10/31/2017   History of stroke 07/16/2017   Headache 04/02/2017   Seizure disorder ([place]) 10/02/2016   Obstructive sleep apnea 11/29/2015   Right shoulder pain 11/29/2015   Adjustment disorder with depressed mood 10/19/2015   Nocturnal enuresis    Hypokalemia    Anemia, iron deficiency    Super obese    Controlled type 2 diabetes mellitus without complication (HCC)    Orthostatic hypotension    Supplemental oxygen dependent    Diastolic dysfunction    Tachypnea    AKI (acute kidney injury) ([place])    Acute blood loss anemia    Lethargy    Wheezing    Benign essential HTN    ICH (intracerebral hemorrhage) ([place]) 10/06/2015    Mathis Dad,  PT 01/09/2021, 4:12 PM  [facility] [address] [place], [place], [address] Phone: [phone]   Fax:  [phone]  Name: DARLETTE DUBOW MRN: [id number] Date of Birth: [birth year]

## 2021-01-10 ENCOUNTER — Ambulatory Visit: Payer: Medicare Other | Attending: Family Medicine | Admitting: Family Medicine

## 2021-01-10 ENCOUNTER — Encounter: Payer: Self-pay | Admitting: Family Medicine

## 2021-01-10 ENCOUNTER — Other Ambulatory Visit: Payer: Self-pay

## 2021-01-10 VITALS — BP 160/102 | HR 90 | Resp 16 | Ht <= 58 in | Wt 301.4 lb

## 2021-01-10 DIAGNOSIS — E1159 Type 2 diabetes mellitus with other circulatory complications: Secondary | ICD-10-CM

## 2021-01-10 DIAGNOSIS — Z0000 Encounter for general adult medical examination without abnormal findings: Secondary | ICD-10-CM | POA: Diagnosis not present

## 2021-01-10 DIAGNOSIS — I152 Hypertension secondary to endocrine disorders: Secondary | ICD-10-CM

## 2021-01-10 NOTE — Patient Instructions (Signed)
  Ms. Gowin , Thank you for taking time to come for your Medicare Wellness Visit. I appreciate your ongoing commitment to your health goals. Please review the following plan we discussed and let me know if I can assist you in the future.   These are the goals we discussed:  Goals   None     This is a list of the screening recommended for you and due dates:  Health Maintenance  Topic Date Due   COVID-19 Vaccine (1) Never done   Eye exam for diabetics  10/09/2020   Zoster (Shingles) Vaccine (1 of 2) 04/12/2021*   Flu Shot  06/22/2021*   Pap Smear  04/22/2021   Hemoglobin A1C  06/17/2021   Complete foot exam   08/14/2021   Mammogram  09/12/2022   Tetanus Vaccine  02/19/2026   Colon Cancer Screening  04/05/2029   Hepatitis C Screening: USPSTF Recommendation to screen - Ages 18-79 yo.  Completed   HIV Screening  Completed   Pneumococcal Vaccination  Aged Out   HPV Vaccine  Aged Out  *Topic was postponed. The date shown is not the original due date.

## 2021-01-10 NOTE — Progress Notes (Signed)
Subjective:   [name] is a 60 y.o. female who presents for Medicare Annual (Subsequent) preventive examination. Her blood pressure is elevated today and her lisinopril dose was increased at her last office visit.  She is yet to take her antihypertensive today.  Review of Systems    General: negative for fever, weight loss, appetite change Eyes: no visual symptoms. ENT: no ear symptoms, no sinus tenderness, no nasal congestion or sore throat. Neck: no pain  Respiratory: no wheezing, shortness of breath, cough Cardiovascular: no chest pain, no dyspnea on exertion, no pedal edema, no orthopnea. Gastrointestinal: no abdominal pain, no diarrhea, no constipation Genito-Urinary: no urinary frequency, no dysuria, no polyuria. Hematologic: no bruising Endocrine: no cold or heat intolerance Neurological: no headaches, no seizures, no tremors Musculoskeletal: no joint pains, no joint swelling Skin: no pruritus, no rash. Psychological: no depression, no anxiety,         Objective:    Today's Vitals   01/10/21 1006  BP: (!) 160/102  Pulse: 90  Resp: 16  SpO2: 96%  Weight: (!) 301 lb 6.4 oz (136.7 kg)  Height: '4\' 10"'  (1.473 m)  PainSc: 4    Body mass index is 62.99 kg/m.  Advanced Directives 01/10/2021 12/27/2020 12/06/2020 11/10/2019 11/09/2019 08/02/2019 04/06/2019  Does Patient Have a Medical Advance Directive? - No No No No No No  Would patient like information on creating a medical advance directive? Yes (Inpatient - patient defers creating a medical advance directive at this time - Information given) No - Patient declined - No - Patient declined - No - Patient declined Yes (Inpatient - patient defers creating a medical advance directive and declines information at this time)    Current Medications (verified) Outpatient Encounter Medications as of 01/10/2021  Medication Sig   Accu-Chek FastClix Lancets MISC Check blood sugar up to 3 times daily. E11.59   acetaminophen  (TYLENOL) 500 MG tablet Take 500-1,000 mg by mouth every 6 (six) hours as needed for mild pain or headache.    atorvastatin (LIPITOR) 20 MG tablet TAKE 1 TABLET (20 MG TOTAL) BY MOUTH DAILY.   Blood Glucose Monitoring Suppl (ACCU-CHEK GUIDE ME) w/Device KIT 1 kit by Does not apply route daily. Check blood sugar up to 3 times daily. E11.59   cephALEXin (KEFLEX) 500 MG capsule TAKE 1 CAPSULE (500 MG TOTAL) BY MOUTH 4 (FOUR) TIMES DAILY FOR 7 DAYS. (Patient not taking: Reported on 12/18/2020)   cetirizine (ZYRTEC) 10 MG tablet Take 10 mg by mouth daily as needed for allergies.    Colchicine (MITIGARE) 0.6 MG CAPS Take 1 capsule by mouth 2 (two) times daily as needed.   colchicine 0.6 MG tablet Take by mouth 2 (two) times daily as needed.   dapagliflozin propanediol (FARXIGA) 5 MG TABS tablet Take 1 tablet (5 mg total) by mouth daily before breakfast.   diclofenac (VOLTAREN) 75 MG EC tablet TAKE 1 TABLET (75 MG TOTAL) BY MOUTH 2 (TWO) TIMES DAILY.   diclofenac Sodium (VOLTAREN) 1 % GEL Apply 1 application topically 4 (four) times daily as needed (pain.). OTC   diphenhydrAMINE (BENADRYL) 25 MG tablet Take 25 mg by mouth at bedtime.   fluticasone (FLONASE) 50 MCG/ACT nasal spray PLACE 1 SPRAY INTO BOTH NOSTRILS DAILY AS NEEDED FOR ALLERGIES OR RHINITIS.   glipiZIDE (GLUCOTROL) 5 MG tablet TAKE 1/2 TABLETS BY MOUTH 2 TIMES DAILY BEFORE A MEAL. (Patient taking differently: Take 2.5 mg by mouth 2 (two) times daily before a meal.)   glucose  blood (ACCU-CHEK GUIDE) test strip Check blood sugar up to 3 times daily. E11.59   hydrocortisone (ANUSOL-HC) 25 MG suppository Place 1 suppository (25 mg total) rectally 2 (two) times daily. (Patient not taking: No sig reported)   hydroxypropyl methylcellulose / hypromellose (ISOPTO TEARS / GONIOVISC) 2.5 % ophthalmic solution Place 2 drops into both eyes as needed (irritation). (Patient taking differently: Place 2 drops into both eyes 4 (four) times daily as needed  (irritation).)   levETIRAcetam (KEPPRA) 500 MG tablet TAKE 1 & 1/2 TABLETS BY MOUTH TWICE DAILY. (Patient taking differently: Take 750 mg by mouth 2 (two) times daily.)   lidocaine (LIDODERM) 5 % Place 1 patch onto the skin daily. Remove & Discard patch within 12 hours or as directed by MD   lisinopril (ZESTRIL) 20 MG tablet Take 1 tablet (20 mg total) by mouth daily.   meclizine (ANTIVERT) 25 MG tablet TAKE 1 TABLET (25 MG TOTAL) BY MOUTH 2 (TWO) TIMES DAILY AS NEEDED FOR DIZZINESS.   metFORMIN (GLUCOPHAGE-XR) 500 MG 24 hr tablet TAKE 2 TABLETS (1,000 MG TOTAL) BY MOUTH 2 (TWO) TIMES DAILY.   methocarbamol (ROBAXIN) 750 MG tablet TAKE 1 TABLET (750 MG TOTAL) BY MOUTH IN THE MORNING AND AT BEDTIME.   metoprolol tartrate (LOPRESSOR) 100 MG tablet TAKE 1 TABLET (100 MG TOTAL) BY MOUTH 2 (TWO) TIMES DAILY.   Misc. Devices MISC Overnight pulse oximetry test.  Diagnosis obstructive sleep apnea (Patient not taking: Reported on 12/18/2020)   Misc. Devices MISC Discontinue Oxygen. Respiration- room air (Patient not taking: Reported on 12/18/2020)   No facility-administered encounter medications on file as of 01/10/2021.    Allergies (verified) Bee venom, Eggs or egg-derived products, Gabapentin, and Gadolinium derivatives   History: Past Medical History:  Diagnosis Date   Headache    Hypertension    Iron deficiency anemia    Obesity    Seizures ([place])    Stroke ([place])    Type 2 diabetes mellitus ([place])    Past Surgical History:  Procedure Laterality Date   CARDIAC CATHETERIZATION  04/2010   Dr. Terrence Dupont   COLONOSCOPY WITH PROPOFOL N/A 04/06/2019   Procedure: COLONOSCOPY WITH PROPOFOL;  Surgeon: Mauri Pole, MD;  Location: [facility];  Service: Endoscopy;  Laterality: N/A;   POLYPECTOMY  04/06/2019   Procedure: POLYPECTOMY;  Surgeon: Mauri Pole, MD;  Location: [facility];  Service: Endoscopy;;   Family History  Problem Relation Age of Onset   Diabetes Father    Hypertension  Father    Hypertension Sister    Stroke Brother    Social History   Socioeconomic History   Marital status: Married    Spouse name: Not on file   Number of children: Not on file   Years of education: Not on file   Highest education level: Not on file  Occupational History   Not on file  Tobacco Use   Smoking status: Never   Smokeless tobacco: Never  Vaping Use   Vaping Use: Never used  Substance and Sexual Activity   Alcohol use: No   Drug use: No   Sexual activity: Not Currently  Other Topics Concern   Not on file  Social History Narrative   Not on file   Social Determinants of Health   Financial Resource Strain: Low Risk    Difficulty of Paying Living Expenses: Not hard at all  Food Insecurity: No Food Insecurity   Worried About [place] in the Last Year: Never true  Ran Out of Food in the Last Year: Never true  Transportation Needs: No Transportation Needs   Lack of Transportation (Medical): No   Lack of Transportation (Non-Medical): No  Physical Activity: Inactive   Days of Exercise per Week: 0 days   Minutes of Exercise per Session: 0 min  Stress: No Stress Concern Present   Feeling of Stress : Not at all  Social Connections: Moderately Integrated   Frequency of Communication with Friends and Family: Once a week   Frequency of Social Gatherings with Friends and Family: Once a week   Attends Religious Services: More than 4 times per year   Active Member of [employer] or Organizations: No   Attends Music therapist: More than 4 times per year   Marital Status: Married    Tobacco Counseling Counseling given: Not Answered   Clinical Intake:     Pain : 0-10 Pain Score: 4  Pain Location: Head     Diabetes: Yes CBG done?: No     Diabetic?Yes         Activities of Daily Living In your present state of health, do you have any difficulty performing the following activities: 01/10/2021 12/06/2020  Hearing? N N  Vision? Y Y   Difficulty concentrating or making decisions? N N  Walking or climbing stairs? Y N  Dressing or bathing? N N  Doing errands, shopping? N Y  Conservation officer, nature and eating ? N Y  Using the Toilet? N N  In the past six months, have you accidently leaked urine? Y Y  Do you have problems with loss of bowel control? N N  Managing your Medications? N N  Managing your Finances? N N  Housekeeping or managing your Housekeeping? N N  Some recent data might be hidden    Patient Care Team: Charlott Rakes, MD as PCP - General (Family Medicine)  Indicate any recent Medical Services you may have received from other than [facility] providers in the past year (date may be approximate).     Assessment:   This is a routine wellness examination for [place].  Hearing/Vision screen No results found.  Dietary issues and exercise activities discussed:     Goals Addressed   None    Depression Screen PHQ 2/9 Scores 12/18/2020 12/06/2020 08/24/2020 06/12/2020 03/13/2020 08/02/2019 02/15/2019  PHQ - 2 Score 0 2 0 0 4 6 0  PHQ- 9 Score 4 8 - - 9 - 0    Fall Risk Fall Risk  01/10/2021 12/18/2020 12/06/2020 08/24/2020 08/14/2020  Falls in the past year? 0 1 1 0 0  Comment - - - - -  Number falls in past yr: 0 1 1 0 0  Injury with Fall? 0 0 0 0 0  Comment - - - - -  Risk for fall due to : No Fall Risks Other (Comment) History of fall(s) - -  Risk for fall due to: Comment - - - - -  Follow up - - - - -    [place]:  Any stairs in or around the home? Yes  If so, are there any without handrails? No  Home free of loose throw rugs in walkways, pet beds, electrical cords, etc? Yes  Adequate lighting in your home to reduce risk of falls? Yes   ASSISTIVE DEVICES UTILIZED TO PREVENT FALLS:  Life alert? No  Use of a cane, walker or w/c? Yes  Grab bars in the bathroom? Yes  Shower  chair or bench in shower? Yes  Elevated toilet seat or a handicapped toilet? No   TIMED UP AND  GO:  Was the test performed? No .  Length of time to ambulate 10 feet: not performed.   Gait slow and steady with assistive device  Cognitive Function: MMSE - Mini Mental State Exam 01/10/2021  Orientation to time 4  Orientation to Place 5  Registration 3  Attention/ Calculation 5  Recall 3  Language- name 2 objects 2  Language- repeat 1  Language- follow 3 step command 1  Language- read & follow direction 1  Write a sentence 1  Copy design 0  Total score 26     6CIT Screen 12/06/2020  What Year? 0 points  What month? 0 points  What time? 0 points  Count back from 20 0 points  Months in reverse 0 points  Repeat phrase 0 points  Total Score 0    Immunizations Immunization History  Administered Date(s) Administered   Tdap 02/20/2016    TDAP status: Due, Education has been provided regarding the importance of this vaccine. Advised may receive this vaccine at local pharmacy or Health Dept. Aware to provide a copy of the vaccination record if obtained from local pharmacy or Health Dept. Verbalized acceptance and understanding.  Flu Vaccine status: Declined, Education has been provided regarding the importance of this vaccine but patient still declined. Advised may receive this vaccine at local pharmacy or Health Dept. Aware to provide a copy of the vaccination record if obtained from local pharmacy or Health Dept. Verbalized acceptance and understanding.  Pneumococcal vaccine status: Declined,  Education has been provided regarding the importance of this vaccine but patient still declined. Advised may receive this vaccine at local pharmacy or Health Dept. Aware to provide a copy of the vaccination record if obtained from local pharmacy or Health Dept. Verbalized acceptance and understanding.   Covid-19 vaccine status: Declined, Education has been provided regarding the importance of this vaccine but patient still declined. Advised may receive this vaccine at local pharmacy or  Health Dept.or vaccine clinic. Aware to provide a copy of the vaccination record if obtained from local pharmacy or Health Dept. Verbalized acceptance and understanding.  Qualifies for Shingles Vaccine? Yes   Zostavax completed No   Shingrix Completed?: No.    Education has been provided regarding the importance of this vaccine. Patient has been advised to call insurance company to determine out of pocket expense if they have not yet received this vaccine. Advised may also receive vaccine at local pharmacy or Health Dept. Verbalized acceptance and understanding.  Screening Tests Health Maintenance  Topic Date Due   COVID-19 Vaccine (1) Never done   OPHTHALMOLOGY EXAM  10/09/2020   Zoster Vaccines- Shingrix (1 of 2) 04/12/2021 (Originally 01/29/2011)   INFLUENZA VACCINE  06/22/2021 (Originally 10/23/2020)   PAP SMEAR-Modifier  04/22/2021   HEMOGLOBIN A1C  06/17/2021   FOOT EXAM  08/14/2021   MAMMOGRAM  09/12/2022   TETANUS/TDAP  02/19/2026   COLONOSCOPY (Pts 45-84yr Insurance coverage will need to be confirmed)  04/05/2029   Hepatitis C Screening  Completed   HIV Screening  Completed   Pneumococcal Vaccine [id number] old  Aged Out   HPV VTar HeelMaintenance Due  Topic Date Due   COVID-19 Vaccine (1) Never done   OPHTHALMOLOGY EXAM  10/09/2020    Colorectal cancer screening: Type of screening: Colonoscopy. Completed 10. Repeat every 10 years  Mammogram status: Completed 08/2020. Repeat every year  Additional Screening:  Hepatitis C Screening: does qualify; Completed 01/2016  Vision Screening: Recommended annual ophthalmology exams for early detection of glaucoma and other disorders of the eye. Is the patient up to date with their annual eye exam?  Yes  Who is the provider or what is the name of the office in which the patient attends annual eye exams? [facility] If pt is not established with a provider, would they like to be  referred to a provider to establish care?  N/a .   Dental Screening: Recommended annual dental exams for proper oral hygiene  Community Resource Referral / Chronic Care Management: CRR required this visit?  No   CCM required this visit?  No      Plan:   1. Encounter for Medicare annual wellness exam Counseled on 150 minutes of exercise per week, healthy eating (including decreased daily intake of saturated fats, cholesterol, added sugars, sodium), routine healthcare maintenance.   2. Hypertension associated with diabetes ([place]) Uncontrolled Lisinopril was increased at her last visit but she is yet to take it this morning I will have her follow-up with the clinical pharmacist to reassess her BP Repeat basic metabolic panel and visit with Pharm.D. to evaluate potassium level Counseled on blood pressure goal of less than 130/80, low-sodium, DASH diet, medication compliance, 150 minutes of moderate intensity exercise per week. Discussed medication compliance, adverse effects.    I have personally reviewed and noted the following in the patient's chart:   Medical and social history Use of alcohol, tobacco or illicit drugs  Current medications and supplements including opioid prescriptions.  Functional ability and status Nutritional status Physical activity Advanced directives List of other physicians Hospitalizations, surgeries, and ER visits in previous 12 months Vitals Screenings to include cognitive, depression, and falls Referrals and appointments  In addition, I have reviewed and discussed with patient certain preventive protocols, quality metrics, and best practice recommendations. A written personalized care plan for preventive services as well as general preventive health recommendations were provided to patient.     Charlott Rakes, MD   01/10/2021

## 2021-01-11 ENCOUNTER — Ambulatory Visit: Payer: Medicare Other

## 2021-01-11 DIAGNOSIS — R2689 Other abnormalities of gait and mobility: Secondary | ICD-10-CM | POA: Diagnosis not present

## 2021-01-11 DIAGNOSIS — M6281 Muscle weakness (generalized): Secondary | ICD-10-CM

## 2021-01-11 DIAGNOSIS — M25562 Pain in left knee: Secondary | ICD-10-CM | POA: Diagnosis not present

## 2021-01-11 DIAGNOSIS — M25561 Pain in right knee: Secondary | ICD-10-CM

## 2021-01-11 DIAGNOSIS — G8929 Other chronic pain: Secondary | ICD-10-CM | POA: Diagnosis not present

## 2021-01-11 NOTE — Therapy (Signed)
[place], [place], [address] Phone: [phone]   Fax:  [phone]  Physical Therapy Treatment  Patient Details  Name: [name] MRN: [id number] Date of Birth: 09-Apr-1960 Referring Provider (PT): Charlott Rakes, MD   Encounter Date: 01/11/2021   PT End of Session - 01/11/21 1357     Visit Number 4    Number of Visits 16    Date for PT Re-Evaluation 02/21/21    Authorization Type UHC MCR    PT Start Time 1400    PT Stop Time 1440    PT Time Calculation (min) 40 min    Activity Tolerance Patient tolerated treatment well    Behavior During Therapy [facility] for tasks assessed/performed             Past Medical History:  Diagnosis Date   Headache    Hypertension    Iron deficiency anemia    Obesity    Seizures ([place])    Stroke ([place])    Type 2 diabetes mellitus ([place])     Past Surgical History:  Procedure Laterality Date   CARDIAC CATHETERIZATION  04/2010   Dr. Terrence Dupont   COLONOSCOPY WITH PROPOFOL N/A 04/06/2019   Procedure: COLONOSCOPY WITH PROPOFOL;  Surgeon: Mauri Pole, MD;  [facility];  Service: Endoscopy;  Laterality: N/A;   POLYPECTOMY  04/06/2019   Procedure: POLYPECTOMY;  Surgeon: Mauri Pole, MD;  [facility];  Service: Endoscopy;;    There were no vitals filed for this visit.   Subjective Assessment - 01/11/21 1358     Subjective Pt presents to PT with no current reports of pain. She says she has been compliant with her HEP with no adverse effect. She is ready to begin PT at this time.    Currently in Pain? No/denies    Pain Score 0-No pain           [facility] Adult PT Treatment/Exercise:   Therapeutic Exercise: NuStep lvl 4 UE/LE x 5 min while taking subjective LAQ 3x15 ea - 5#  Seated hamstring curl 3x10 - blue tband Seated ball squeeze 2x10 - 5 sec hold Seated clamshell 3x15 blue tband Seated march - feet on 4in platform x 20  5lb STS x 5 - feet on 4in step                               PT Short Term Goals - 12/27/20 1514       PT SHORT TERM GOAL #1   Title [name] will be >75% HEP compliant to improve carryover between sessions and facilitate independent management of condition    Target Date 01/17/21               PT Long Term Goals - 12/27/20 1514       PT LONG TERM GOAL #1   Title [name] will improve the following MMTs to >/= 4/5 to show improvement in strength:  R knee ext and flexion  EVAL: 3/5  target date: 02/21/2021      PT LONG TERM GOAL #2   Title [name] will improve 10 meter max gait speed to .2 m/s (.1 m/s MCID) to show functional improvement in ambulation  EVAL: .1 m/s  target date: 02/21/21      PT LONG TERM GOAL #3   Title [name] will improve 30'' STS (MCID  2) to >/= 6x to show improved LE strength and improved transfers  EVAL: 4x   target date: 02/21/21      PT LONG TERM GOAL #4   Title [name] will report >/= 50% decrease in pain from evaluation  EVAL: 10/10 max pain  target date: 02/21/21                   Plan - 01/11/21 1405     Clinical Impression Statement Pt was able to complete prescribed exercises with no adverse effect. She continues to progress fair with therapy, but continues to have some deficits in overall strength and activity tolerance. Pt continues to benefit from skilled PT services working on improving LE strength and mobility. Will continue to progress as tolerated.    PT Treatment/Interventions ADLs/Self Care Home Management;Aquatic Therapy;Iontophoresis 4mg /ml Dexamethasone;Electrical Stimulation;Gait training;Therapeutic activities;Therapeutic exercise;Neuromuscular re-education;Manual techniques;Dry needling;Vasopneumatic Device;Joint Manipulations    PT Next Visit Plan Discuss high sugar foods and follow up with nutritionist, progressive R knee strengthening, increase  general activity tolerance    PT Home Exercise Plan [facility]             Patient will benefit from skilled therapeutic intervention in order to improve the following deficits and impairments:  Abnormal gait, Decreased balance, Difficulty walking, Obesity, Pain, Decreased strength, Decreased activity tolerance, Cardiopulmonary status limiting activity  Visit Diagnosis: Chronic pain of right knee  Chronic pain of left knee  Other abnormalities of gait and mobility  Balance problem  Muscle weakness     Problem List Patient Active Problem List   Diagnosis Date Noted   Gait disturbance, post-stroke 06/12/2020   Cellulitis of left lower extremity 03/14/2020   Leukocytosis 03/14/2020   Rotator cuff impingement syndrome, right 03/09/2020   Chronic right shoulder pain 03/09/2020   Acute hypoxemic respiratory failure due to COVID-19 ([place]) 11/10/2019   Urinary frequency 06/24/2019   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   BRBPR (bright red blood per rectum)    Polyp of cecum    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[place]) 12/08/2018   Empty sella ([place]) 02/24/2018   Morbid obesity due to excess calories ([place]) 02/05/2018   Abnormality of gait following cerebrovascular accident (CVA) 10/31/2017   History of stroke 07/16/2017   Headache 04/02/2017   Seizure disorder ([place]) 10/02/2016   Obstructive sleep apnea 11/29/2015   Right shoulder pain 11/29/2015   Adjustment disorder with depressed mood 10/19/2015   Nocturnal enuresis    Hypokalemia    Anemia, iron deficiency    Super obese    Controlled type 2 diabetes mellitus without complication (HCC)    Orthostatic hypotension    Supplemental oxygen dependent    Diastolic dysfunction    Tachypnea    AKI (acute kidney injury) ([place])    Acute blood loss anemia    Lethargy    Wheezing    Benign essential HTN    ICH (intracerebral hemorrhage) ([place]) 10/06/2015    Ward Chatters, PT 01/11/2021, 2:42 PM  [facility]  [facility] Outpatient [facility] [address] [place], [place], [address] Phone: [phone]   Fax:  [phone]  Name: [name] MRN: [id number] Date of Birth: [birth year]

## 2021-01-16 ENCOUNTER — Ambulatory Visit: Payer: Medicare Other

## 2021-01-16 ENCOUNTER — Telehealth: Payer: Self-pay

## 2021-01-16 NOTE — Telephone Encounter (Signed)
PT called and spoke with patient who stated she forgot about appointment today.   PT reminded her of next appointment and attendance policy.  Ward Chatters, PT, DPT 01/16/21 2:27 PM

## 2021-01-17 MED FILL — Diclofenac Sodium Tab Delayed Release 75 MG: ORAL | 30 days supply | Qty: 60 | Fill #0 | Status: AC

## 2021-01-18 ENCOUNTER — Ambulatory Visit: Payer: Medicare Other

## 2021-01-18 ENCOUNTER — Other Ambulatory Visit: Payer: Self-pay

## 2021-01-18 DIAGNOSIS — R2689 Other abnormalities of gait and mobility: Secondary | ICD-10-CM | POA: Diagnosis not present

## 2021-01-18 DIAGNOSIS — G8929 Other chronic pain: Secondary | ICD-10-CM

## 2021-01-18 DIAGNOSIS — M25562 Pain in left knee: Secondary | ICD-10-CM | POA: Diagnosis not present

## 2021-01-18 DIAGNOSIS — M25561 Pain in right knee: Secondary | ICD-10-CM

## 2021-01-18 DIAGNOSIS — M6281 Muscle weakness (generalized): Secondary | ICD-10-CM

## 2021-01-18 NOTE — Therapy (Signed)
[place], [place], [address] Phone: [phone]   Fax:  [phone]  Physical Therapy Treatment  Patient Details  Name: [name] MRN: [id number] Date of Birth: 1960-10-29 Referring Provider (PT): Charlott Rakes, MD   Encounter Date: 01/18/2021   PT End of Session - 01/18/21 1401     Visit Number 5    Number of Visits 16    Date for PT Re-Evaluation 02/21/21    Authorization Type UHC MCR    PT Start Time 1401    PT Stop Time 1443    PT Time Calculation (min) 42 min    Activity Tolerance Patient tolerated treatment well    Behavior During Therapy [facility] for tasks assessed/performed             Past Medical History:  Diagnosis Date   Headache    Hypertension    Iron deficiency anemia    Obesity    Seizures ([place])    Stroke ([place])    Type 2 diabetes mellitus ([place])     Past Surgical History:  Procedure Laterality Date   CARDIAC CATHETERIZATION  04/2010   Dr. Terrence Dupont   COLONOSCOPY WITH PROPOFOL N/A 04/06/2019   Procedure: COLONOSCOPY WITH PROPOFOL;  Surgeon: Mauri Pole, MD;  Location: [facility];  Service: Endoscopy;  Laterality: N/A;   POLYPECTOMY  04/06/2019   Procedure: POLYPECTOMY;  Surgeon: Mauri Pole, MD;  Location: [facility];  Service: Endoscopy;;    There were no vitals filed for this visit.   Subjective Assessment - 01/18/21 1401     Subjective Pt presents to PT with no current reports of knee pain. She has been compliant with her HEP with no adverse effects. Pt is ready to begin PT treatment.    Currently in Pain? No/denies    Pain Score 0-No pain           [facility] Adult PT Treatment/Exercise:   Therapeutic Exercise: NuStep lvl 4 UE/LE x 5 min while taking subjective LAQ 3x15 ea - 5#  Seated ball squeeze 2x10 - 5 sec hold Seated clamshell 3x15 blue tband Seated march - feet on 4in platform x 20 5lb STS 3x5 - feet on 4in step  Past Interventions  Not Performed Today: Seated hamstring curl 3x10 - blue tband                               PT Short Term Goals - 12/27/20 1514       PT SHORT TERM GOAL #1   Title [name] will be >75% HEP compliant to improve carryover between sessions and facilitate independent management of condition    Target Date 01/17/21               PT Long Term Goals - 12/27/20 1514       PT LONG TERM GOAL #1   Title TENNELLE TAFLINGER will improve the following MMTs to >/= 4/5 to show improvement in strength:  R knee ext and flexion  EVAL: 3/5  target date: 02/21/2021      PT LONG TERM GOAL #2   Title [name] will improve 10 meter max gait speed to .2 m/s (.1 m/s MCID) to show functional improvement in ambulation  EVAL: .1 m/s  target date: 02/21/21      PT LONG TERM GOAL #3   Title CHIQUITTA MATTY will improve 30''  STS (MCID 2) to >/= 6x to show improved LE strength and improved transfers  EVAL: 4x   target date: 02/21/21      PT LONG TERM GOAL #4   Title [name] will report >/= 50% decrease in pain from evaluation  EVAL: 10/10 max pain  target date: 02/21/21                   Plan - 01/18/21 1416     Clinical Impression Statement Pt was again able to complete all prescribed exercises with no adverse effect or change in baseline. Today we continued to progress LE strengthening exercises in order to improve functional mobility. Pt continues to benefit from skilled PT services in order to improve strength and decrease pain. PT will continue to progress exercises as tolerated per POC.    PT Treatment/Interventions ADLs/Self Care Home Management;Aquatic Therapy;Iontophoresis 4mg /ml Dexamethasone;Electrical Stimulation;Gait training;Therapeutic activities;Therapeutic exercise;Neuromuscular re-education;Manual techniques;Dry needling;Vasopneumatic Device;Joint Manipulations    PT Next Visit Plan Discuss high sugar foods and follow up with  nutritionist, progressive R knee strengthening, increase general activity tolerance    PT Home Exercise Plan [facility]             Patient will benefit from skilled therapeutic intervention in order to improve the following deficits and impairments:  Abnormal gait, Decreased balance, Difficulty walking, Obesity, Pain, Decreased strength, Decreased activity tolerance, Cardiopulmonary status limiting activity  Visit Diagnosis: Chronic pain of right knee  Chronic pain of left knee  Other abnormalities of gait and mobility  Balance problem  Muscle weakness     Problem List Patient Active Problem List   Diagnosis Date Noted   Gait disturbance, post-stroke 06/12/2020   Cellulitis of left lower extremity 03/14/2020   Leukocytosis 03/14/2020   Rotator cuff impingement syndrome, right 03/09/2020   Chronic right shoulder pain 03/09/2020   Acute hypoxemic respiratory failure due to COVID-19 ([place]) 11/10/2019   Urinary frequency 06/24/2019   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   BRBPR (bright red blood per rectum)    Polyp of cecum    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[place]) 12/08/2018   Empty sella ([place]) 02/24/2018   Morbid obesity due to excess calories ([place]) 02/05/2018   Abnormality of gait following cerebrovascular accident (CVA) 10/31/2017   History of stroke 07/16/2017   Headache 04/02/2017   Seizure disorder ([place]) 10/02/2016   Obstructive sleep apnea 11/29/2015   Right shoulder pain 11/29/2015   Adjustment disorder with depressed mood 10/19/2015   Nocturnal enuresis    Hypokalemia    Anemia, iron deficiency    Super obese    Controlled type 2 diabetes mellitus without complication (HCC)    Orthostatic hypotension    Supplemental oxygen dependent    Diastolic dysfunction    Tachypnea    AKI (acute kidney injury) ([place])    Acute blood loss anemia    Lethargy    Wheezing    Benign essential HTN    ICH (intracerebral hemorrhage) ([place]) 10/06/2015     Ward Chatters, PT 01/18/2021, 2:45 PM  [facility] [address] [place], [place], [address] Phone: [phone]   Fax:  [phone]  Name: [name] MRN: [id number] Date of Birth: [birth year]

## 2021-01-23 ENCOUNTER — Ambulatory Visit: Payer: Medicare Other | Admitting: Physical Therapy

## 2021-01-24 ENCOUNTER — Other Ambulatory Visit: Payer: Self-pay | Admitting: Family Medicine

## 2021-01-24 DIAGNOSIS — E1159 Type 2 diabetes mellitus with other circulatory complications: Secondary | ICD-10-CM

## 2021-01-24 NOTE — Telephone Encounter (Signed)
Requested medications are due for refill today.  yes  Requested medications are on the active medications list.  yes  Last refill. 11/30/2019  Future visit scheduled.   no  Notes to clinic.  Rx was written to expire 11/29/2020.

## 2021-01-25 ENCOUNTER — Other Ambulatory Visit: Payer: Self-pay

## 2021-01-25 ENCOUNTER — Encounter: Payer: Self-pay | Admitting: Physical Therapy

## 2021-01-25 ENCOUNTER — Ambulatory Visit: Payer: Medicare Other | Attending: Family Medicine | Admitting: Physical Therapy

## 2021-01-25 DIAGNOSIS — M25562 Pain in left knee: Secondary | ICD-10-CM | POA: Diagnosis not present

## 2021-01-25 DIAGNOSIS — M25561 Pain in right knee: Secondary | ICD-10-CM | POA: Diagnosis not present

## 2021-01-25 DIAGNOSIS — G8929 Other chronic pain: Secondary | ICD-10-CM | POA: Insufficient documentation

## 2021-01-25 DIAGNOSIS — M6281 Muscle weakness (generalized): Secondary | ICD-10-CM | POA: Insufficient documentation

## 2021-01-25 DIAGNOSIS — R2689 Other abnormalities of gait and mobility: Secondary | ICD-10-CM | POA: Diagnosis not present

## 2021-01-25 NOTE — Therapy (Signed)
[place], [place], [address] Phone: [phone]   Fax:  [phone]  Physical Therapy Treatment  Patient Details  Name: [name] MRN: [id number] Date of Birth: 04-22-1960 Referring Provider (PT): Charlott Rakes, MD   Encounter Date: 01/25/2021   PT End of Session - 01/25/21 1538     Visit Number 6    Number of Visits 16    Date for PT Re-Evaluation 02/21/21    Authorization Type UHC MCR    PT Start Time 7782    PT Stop Time 1616    PT Time Calculation (min) 38 min    Activity Tolerance Patient tolerated treatment well    Behavior During Therapy [facility] for tasks assessed/performed             Past Medical History:  Diagnosis Date   Headache    Hypertension    Iron deficiency anemia    Obesity    Seizures ([place])    Stroke ([place])    Type 2 diabetes mellitus ([place])     Past Surgical History:  Procedure Laterality Date   CARDIAC CATHETERIZATION  04/2010   Dr. Terrence Dupont   COLONOSCOPY WITH PROPOFOL N/A 04/06/2019   Procedure: COLONOSCOPY WITH PROPOFOL;  Surgeon: Mauri Pole, MD;  [facility];  Service: Endoscopy;  Laterality: N/A;   POLYPECTOMY  04/06/2019   Procedure: POLYPECTOMY;  Surgeon: Mauri Pole, MD;  [facility];  Service: Endoscopy;;    There were no vitals filed for this visit.   Subjective Assessment - 01/25/21 1546     Subjective Pt reports that she was sick starting last week, but is feeling a bit better now.  She tested for COVID and was (-), she did not have a temperature.  She is having 5/10 R knee pain.    Pertinent History Significant PMH: hx of stroke (2017), DM TII, anemia             [facility] Adult PT Treatment/Exercise:   Therapeutic Exercise: NuStep lvl 5 UE/LE x 5 min while taking subjective LAQ 3x15 ea - 7.5# Seated ball squeeze x10 10'' Seated clamshell 3x15 black tband Seated march - feet on 4in platform x 10 7.5 lb STS  2x5 - feet on 4in step   Past Interventions Not Performed Today: Seated hamstring curl 3x10 - blue tband     PT Short Term Goals - 12/27/20 1514       PT SHORT TERM GOAL #1   Title [name] will be >75% HEP compliant to improve carryover between sessions and facilitate independent management of condition    Target Date 01/17/21               PT Long Term Goals - 12/27/20 1514       PT LONG TERM GOAL #1   Title [name] will improve the following MMTs to >/= 4/5 to show improvement in strength:  R knee ext and flexion  EVAL: 3/5  target date: 02/21/2021      PT LONG TERM GOAL #2   Title [name] will improve 10 meter Mary gait speed to .2 m/s (.1 m/s MCID) to show functional improvement in ambulation  EVAL: .1 m/s  target date: 02/21/21      PT LONG TERM GOAL #3   Title [name] will improve 30'' STS (MCID 2) to >/= 6x to show improved LE strength and improved transfers  EVAL: 4x  target date: 02/21/21      PT LONG TERM GOAL #4   Title [name] will report >/= 50% decrease in pain from evaluation  EVAL: 10/10 Mary pain  target date: 02/21/21                   Plan - 01/25/21 1610     Clinical Impression Statement Pt reports mild pain reduction following therapy.  Overall, [name] is progressing well with therapy.  Today we concentrated on quad strengthening and hip strengthening.  Pt able to progress to 7.5# with LAQ and march demonstrating improved quad strength.  R quad continues to show strength deficit compared to L.  Pt will continue to benefit from skilled physical therapy to address remaining deficits and achieve listed goals.  Continue per POC.    PT Treatment/Interventions ADLs/Self Care Home Management;Aquatic Therapy;Iontophoresis 4mg /ml Dexamethasone;Electrical Stimulation;Gait training;Therapeutic activities;Therapeutic exercise;Neuromuscular re-education;Manual techniques;Dry needling;Vasopneumatic  Device;Joint Manipulations    PT Next Visit Plan Discuss high sugar foods and follow up with nutritionist, progressive R knee strengthening, increase general activity tolerance    PT Home Exercise Plan [facility]             Patient will benefit from skilled therapeutic intervention in order to improve the following deficits and impairments:  Abnormal gait, Decreased balance, Difficulty walking, Obesity, Pain, Decreased strength, Decreased activity tolerance, Cardiopulmonary status limiting activity  Visit Diagnosis: Chronic pain of right knee  Chronic pain of left knee  Other abnormalities of gait and mobility  Balance problem  Muscle weakness     Problem List Patient Active Problem List   Diagnosis Date Noted   Gait disturbance, post-stroke 06/12/2020   Cellulitis of left lower extremity 03/14/2020   Leukocytosis 03/14/2020   Rotator cuff impingement syndrome, right 03/09/2020   Chronic right shoulder pain 03/09/2020   Acute hypoxemic respiratory failure due to COVID-19 ([place]) 11/10/2019   Urinary frequency 06/24/2019   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   BRBPR (bright red blood per rectum)    Polyp of cecum    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[place]) 12/08/2018   Empty sella ([place]) 02/24/2018   Morbid obesity due to excess calories ([place]) 02/05/2018   Abnormality of gait following cerebrovascular accident (CVA) 10/31/2017   History of stroke 07/16/2017   Headache 04/02/2017   Seizure disorder ([place]) 10/02/2016   Obstructive sleep apnea 11/29/2015   Right shoulder pain 11/29/2015   Adjustment disorder with depressed mood 10/19/2015   Nocturnal enuresis    Hypokalemia    Anemia, iron deficiency    Super obese    Controlled type 2 diabetes mellitus without complication (HCC)    Orthostatic hypotension    Supplemental oxygen dependent    Diastolic dysfunction    Tachypnea    AKI (acute kidney injury) ([place])    Acute blood loss anemia    Lethargy     Wheezing    Benign essential HTN    ICH (intracerebral hemorrhage) ([place]) 10/06/2015    Mathis Dad, PT 01/25/2021, 4:10 PM  [facility] [address] [place], [place], [address] Phone: [phone]   Fax:  [phone]  Name: [name] MRN: [id number] Date of Birth: March 16, 1961

## 2021-01-26 ENCOUNTER — Other Ambulatory Visit: Payer: Self-pay

## 2021-01-26 MED ORDER — GLIPIZIDE 5 MG PO TABS
ORAL_TABLET | ORAL | 6 refills | Status: DC
  Filled 2021-01-26: qty 30, 30d supply, fill #0
  Filled 2021-03-13: qty 30, 30d supply, fill #1
  Filled 2021-04-20: qty 30, 30d supply, fill #0
  Filled 2021-04-20: qty 30, 30d supply, fill #2
  Filled 2021-05-20: qty 30, 30d supply, fill #1
  Filled 2021-07-01: qty 30, 30d supply, fill #2

## 2021-01-30 ENCOUNTER — Ambulatory Visit: Payer: Medicare Other | Admitting: Physical Therapy

## 2021-01-30 ENCOUNTER — Other Ambulatory Visit: Payer: Self-pay

## 2021-01-30 ENCOUNTER — Encounter: Payer: Self-pay | Admitting: Physical Therapy

## 2021-01-30 DIAGNOSIS — M25562 Pain in left knee: Secondary | ICD-10-CM | POA: Diagnosis not present

## 2021-01-30 DIAGNOSIS — R2689 Other abnormalities of gait and mobility: Secondary | ICD-10-CM

## 2021-01-30 DIAGNOSIS — G8929 Other chronic pain: Secondary | ICD-10-CM

## 2021-01-30 DIAGNOSIS — M6281 Muscle weakness (generalized): Secondary | ICD-10-CM | POA: Diagnosis not present

## 2021-01-30 DIAGNOSIS — M25561 Pain in right knee: Secondary | ICD-10-CM | POA: Diagnosis not present

## 2021-01-30 NOTE — Therapy (Signed)
[place], [place], [address] Phone: [phone]   Fax:  [phone]  Physical Therapy Treatment  Patient Details  Name: [name] MRN: [id number] Date of Birth: [birth year] Referring Provider (PT): Charlott Rakes, MD   Encounter Date: 01/30/2021   PT End of Session - 01/30/21 1404     Visit Number 7    Number of Visits 16    Date for PT Re-Evaluation 02/21/21    Authorization Type UHC MCR    PT Start Time 1884    PT Stop Time 1445    PT Time Calculation (min) 41 min    Activity Tolerance Patient tolerated treatment well    Behavior During Therapy [facility] for tasks assessed/performed             Past Medical History:  Diagnosis Date   Headache    Hypertension    Iron deficiency anemia    Obesity    Seizures ([place])    Stroke ([place])    Type 2 diabetes mellitus ([place])     Past Surgical History:  Procedure Laterality Date   CARDIAC CATHETERIZATION  04/2010   Dr. Terrence Dupont   COLONOSCOPY WITH PROPOFOL N/A 04/06/2019   Procedure: COLONOSCOPY WITH PROPOFOL;  Surgeon: Mauri Pole, MD;  [facility];  Service: Endoscopy;  Laterality: N/A;   POLYPECTOMY  04/06/2019   Procedure: POLYPECTOMY;  Surgeon: Mauri Pole, MD;  [facility];  Service: Endoscopy;;    There were no vitals filed for this visit.   Subjective Assessment - 01/30/21 1410     Subjective Pt reports that she had a fall about 4 days ago.  She fell forward for unknown reason and hit her head on the wall.  Denies LOC.  She she has been having some headaches since then.  Denies dizziness or vision changes (possible vision changes immediatly after fall).  She called her MD but did not hear back.  I reccomended she follow up on this.  She is having 4/10 R knee pain.    Pertinent History Significant PMH: hx of stroke (2017), DM TII, anemia             [facility] Adult PT Treatment/Exercise:   Therapeutic  Exercise: NuStep lvl 6 UE/LE x 5 min while taking subjective LAQ 3x15 ea - 15# Seated ball squeeze 3x10 Seated clamshell 3x15 black tband Seated march - feet on 4in platform 2x10 10 lb STS x5 - feet on 4in step Seated hamstring curl 3x10 - black tband    PT Short Term Goals - 01/30/21 1414       PT SHORT TERM GOAL #1   Title [name] will be >75% HEP compliant to improve carryover between sessions and facilitate independent management of condition    Baseline 11/8: MET    Target Date 01/17/21               PT Long Term Goals - 12/27/20 1514       PT LONG TERM GOAL #1   Title [name] will improve the following MMTs to >/= 4/5 to show improvement in strength:  R knee ext and flexion  EVAL: 3/5  target date: 02/21/2021      PT LONG TERM GOAL #2   Title [name] will improve 10 meter max gait speed to .2 m/s (.1 m/s MCID) to show functional improvement in ambulation  EVAL: .1 m/s  target date: 02/21/21  PT LONG TERM GOAL #3   Title [name] will improve 30'' STS (MCID 2) to >/= 6x to show improved LE strength and improved transfers  EVAL: 4x   target date: 02/21/21      PT LONG TERM GOAL #4   Title [name] will report >/= 50% decrease in pain from evaluation  EVAL: 10/10 max pain  target date: 02/21/21                   Plan - 01/30/21 1426     Clinical Impression Statement Overall, [name] is progressing well with therapy.  Pt reports no increase in baseline pain following therapy.  Today we concentrated on knee strengthening and hip strengthening.  Pt continues to show significant progress with knee strength; able to progress to 10# LAQ on R today with no pain.  Pt will continue to benefit from skilled physical therapy to address remaining deficits and achieve listed goals.  Continue per POC.    PT Treatment/Interventions ADLs/Self Care Home Management;Aquatic Therapy;Iontophoresis 42m/ml Dexamethasone;Electrical  Stimulation;Gait training;Therapeutic activities;Therapeutic exercise;Neuromuscular re-education;Manual techniques;Dry needling;Vasopneumatic Device;Joint Manipulations    PT Next Visit Plan Discuss high sugar foods and follow up with nutritionist, progressive R knee strengthening, increase general activity tolerance    [facility]            Patient will benefit from skilled therapeutic intervention in order to improve the following deficits and impairments:  Abnormal gait, Decreased balance, Difficulty walking, Obesity, Pain, Decreased strength, Decreased activity tolerance, Cardiopulmonary status limiting activity  Visit Diagnosis: Chronic pain of right knee  Chronic pain of left knee  Other abnormalities of gait and mobility  Balance problem     Problem List Patient Active Problem List   Diagnosis Date Noted   Gait disturbance, post-stroke 06/12/2020   Cellulitis of left lower extremity 03/14/2020   Leukocytosis 03/14/2020   Rotator cuff impingement syndrome, right 03/09/2020   Chronic right shoulder pain 03/09/2020   Acute hypoxemic respiratory failure due to COVID-19 ([place] 11/10/2019   Urinary frequency 06/24/2019   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   BRBPR (bright red blood per rectum)    Polyp of cecum    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[place] 12/08/2018   Empty sella ([place] 02/24/2018   Morbid obesity due to excess calories ([place] 02/05/2018   Abnormality of gait following cerebrovascular accident (CVA) 10/31/2017   History of stroke 07/16/2017   Headache 04/02/2017   Seizure disorder ([place] 10/02/2016   Obstructive sleep apnea 11/29/2015   Right shoulder pain 11/29/2015   Adjustment disorder with depressed mood 10/19/2015   Nocturnal enuresis    Hypokalemia    Anemia, iron deficiency    Super obese    Controlled type 2 diabetes mellitus without complication (HCC)    Orthostatic hypotension    Supplemental oxygen  dependent    Diastolic dysfunction    Tachypnea    AKI (acute kidney injury) ([place]    Acute blood loss anemia    Lethargy    Wheezing    Benign essential HTN    ICH (intracerebral hemorrhage) ([place] 10/06/2015    KMathis Dad PT 01/30/2021, 2:42 PM  [facility] [address]Phone: [phone]  Fax:  [phone] Name: TGERRE RANUMMRN: [id number] of Birth: [birth year]

## 2021-02-07 ENCOUNTER — Other Ambulatory Visit: Payer: Self-pay

## 2021-02-07 ENCOUNTER — Encounter: Payer: Self-pay | Admitting: Physical Therapy

## 2021-02-07 ENCOUNTER — Ambulatory Visit: Payer: Medicare Other | Admitting: Physical Therapy

## 2021-02-07 DIAGNOSIS — R2689 Other abnormalities of gait and mobility: Secondary | ICD-10-CM

## 2021-02-07 DIAGNOSIS — G8929 Other chronic pain: Secondary | ICD-10-CM | POA: Diagnosis not present

## 2021-02-07 DIAGNOSIS — M6281 Muscle weakness (generalized): Secondary | ICD-10-CM

## 2021-02-07 DIAGNOSIS — M25561 Pain in right knee: Secondary | ICD-10-CM

## 2021-02-07 DIAGNOSIS — M25562 Pain in left knee: Secondary | ICD-10-CM | POA: Diagnosis not present

## 2021-02-07 NOTE — Patient Instructions (Signed)
Access Code: [facility] URL: [email or web address] Date: 02/07/2021 Prepared by: Shearon Balo  Exercises Seated Knee Extension with Resistance - 2 x daily - 7 x weekly - 3 sets - 10 reps Seated Hamstring Curl with Anchored Resistance - 2 x daily - 7 x weekly - 3 sets - 10 reps Seated Hip Adduction Squeeze with Ball - 2 x daily - 7 x weekly - 2 sets - 10 reps - 5 sec hold Seated Hip Abduction - 1 x daily - 7 x weekly - 3 sets - 10 reps Seated Knee Lifts with Resistance - 1 x daily - 7 x weekly - 3 sets - 10 reps

## 2021-02-07 NOTE — Therapy (Signed)
[place], [place], [address] Phone: [phone]   Fax:  [phone]  Physical Therapy Treatment  Patient Details  Name: [name] MRN: [id number] Date of Birth: 02/22/1961 Referring Provider (PT): Charlott Rakes, MD   Encounter Date: 02/07/2021   PT End of Session - 02/07/21 0924     Visit Number 8    Number of Visits 16    Date for PT Re-Evaluation 02/21/21    Authorization Type UHC MCR    PT Start Time 3893   pt arrived late   PT Stop Time 1001    PT Time Calculation (min) 38 min    Activity Tolerance Patient tolerated treatment well    Behavior During Therapy [facility] for tasks assessed/performed             Past Medical History:  Diagnosis Date   Headache    Hypertension    Iron deficiency anemia    Obesity    Seizures ([place])    Stroke ([place])    Type 2 diabetes mellitus ([place])     Past Surgical History:  Procedure Laterality Date   CARDIAC CATHETERIZATION  04/2010   Dr. Terrence Dupont   COLONOSCOPY WITH PROPOFOL N/A 04/06/2019   Procedure: COLONOSCOPY WITH PROPOFOL;  Surgeon: Mauri Pole, MD;  [facility];  Service: Endoscopy;  Laterality: N/A;   POLYPECTOMY  04/06/2019   Procedure: POLYPECTOMY;  Surgeon: Mauri Pole, MD;  [facility];  Service: Endoscopy;;    There were no vitals filed for this visit.   Subjective Assessment - 02/07/21 0931     Subjective Pt reports that overall she feels she is improving.  She feels stiff this morning.  This usually lasts until mid-day.  She is having 0/10 R knee pain.    Pertinent History Significant PMH: hx of stroke (2017), DM TII, anemia              [facility] Adult PT Treatment/Exercise:   Therapeutic Exercise: NuStep lvl 7 UE/LE x 5 min while taking subjective LAQ 3x15 ea - 10# Seated ball squeeze 3x10 Seated clamshell 2x15 black tband Seated march - feet on 4in platform 2x10 10 lb STS x10 (limited by  fatigue) Seated hamstring curl 3x10 - black tband (NT)  Patient Education: - HEP was updated and reissued to patient; pt educated on HEP, was provided handout, and verbally confirmed understanding of exercises.    PT Short Term Goals - 01/30/21 1414       PT SHORT TERM GOAL #1   Title JUAN OLTHOFF will be >75% HEP compliant to improve carryover between sessions and facilitate independent management of condition    Baseline 11/8: MET    Target Date 01/17/21               PT Long Term Goals - 12/27/20 1514       PT LONG TERM GOAL #1   Title AFRA TRICARICO will improve the following MMTs to >/= 4/5 to show improvement in strength:  R knee ext and flexion  EVAL: 3/5  target date: 02/21/2021      PT LONG TERM GOAL #2   Title SHELIE LANSING will improve 10 meter max gait speed to .2 m/s (.1 m/s MCID) to show functional improvement in ambulation  EVAL: .1 m/s  target date: 02/21/21      PT LONG TERM GOAL #3   Title APOORVA BUGAY will improve 30'' STS (MCID  2) to >/= 6x to show improved LE strength and improved transfers  EVAL: 4x   target date: 02/21/21      PT LONG TERM GOAL #4   Title KASSADIE PANCAKE will report >/= 50% decrease in pain from evaluation  EVAL: 10/10 max pain  target date: 02/21/21                   Plan - 02/07/21 0953     Clinical Impression Statement Overall, Willye is progressing well with therapy.  Pt reports no increase in baseline pain following therapy.  Today we concentrated on knee strengthening and hip strengthening.  Pt remains limited by CV endurance with higher level standing activities.  She is progressing strength of the hips and knees.  We discussed getting up earlier and doing exercise in a chair (by bed) or in bed 20 min before starting her day as a way to mitigate morning stiffness.  She reports she will try this.  Pt will continue to benefit from skilled physical therapy to address remaining deficits and achieve listed  goals.  Continue per POC.    PT Treatment/Interventions ADLs/Self Care Home Management;Aquatic Therapy;Iontophoresis 37m/ml Dexamethasone;Electrical Stimulation;Gait training;Therapeutic activities;Therapeutic exercise;Neuromuscular re-education;Manual techniques;Dry needling;Vasopneumatic Device;Joint Manipulations    PT Next Visit Plan Discuss high sugar foods and follow up with nutritionist, progressive R knee strengthening, increase general activity tolerance    PT [facility]            Patient will benefit from skilled therapeutic intervention in order to improve the following deficits and impairments:  Abnormal gait, Decreased balance, Difficulty walking, Obesity, Pain, Decreased strength, Decreased activity tolerance, Cardiopulmonary status limiting activity  Visit Diagnosis: Chronic pain of right knee  Chronic pain of left knee  Other abnormalities of gait and mobility  Balance problem  Muscle weakness     Problem List Patient Active Problem List   Diagnosis Date Noted   Gait disturbance, post-stroke 06/12/2020   Cellulitis of left lower extremity 03/14/2020   Leukocytosis 03/14/2020   Rotator cuff impingement syndrome, right 03/09/2020   Chronic right shoulder pain 03/09/2020   Acute hypoxemic respiratory failure due to COVID-19 (HF[place] 11/10/2019   Urinary frequency 06/24/2019   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   BRBPR (bright red blood per rectum)    Polyp of cecum    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[place] 12/08/2018   Empty sella ([place] 02/24/2018   Morbid obesity due to excess calories ([place] 02/05/2018   Abnormality of gait following cerebrovascular accident (CVA) 10/31/2017   History of stroke 07/16/2017   Headache 04/02/2017   Seizure disorder (HL[place] 10/02/2016   Obstructive sleep apnea 11/29/2015   Right shoulder pain 11/29/2015   Adjustment disorder with depressed mood 10/19/2015   Nocturnal enuresis     Hypokalemia    Anemia, iron deficiency    Super obese    Controlled type 2 diabetes mellitus without complication (HCC)    Orthostatic hypotension    Supplemental oxygen dependent    Diastolic dysfunction    Tachypnea    AKI (acute kidney injury) ([place]Color[place]    Acute blood loss anemia    Lethargy    Wheezing    Benign essential HTN    ICH (intracerebral hemorrhage) ([place] 10/06/2015    KMathis Dad PT 02/07/2021, 11:33 AM  [facility] [address]Phone: [phone]  Fax:  [phone] Name: TBABBIE DONDLINGERMRN:  [id number] Date of Birth: 09-Nov-1960

## 2021-02-10 ENCOUNTER — Other Ambulatory Visit: Payer: Self-pay

## 2021-02-10 ENCOUNTER — Ambulatory Visit: Payer: Medicare Other | Admitting: Physical Therapy

## 2021-02-10 ENCOUNTER — Encounter: Payer: Self-pay | Admitting: Physical Therapy

## 2021-02-10 DIAGNOSIS — M25561 Pain in right knee: Secondary | ICD-10-CM

## 2021-02-10 DIAGNOSIS — R2689 Other abnormalities of gait and mobility: Secondary | ICD-10-CM | POA: Diagnosis not present

## 2021-02-10 DIAGNOSIS — G8929 Other chronic pain: Secondary | ICD-10-CM | POA: Diagnosis not present

## 2021-02-10 DIAGNOSIS — M25562 Pain in left knee: Secondary | ICD-10-CM | POA: Diagnosis not present

## 2021-02-10 DIAGNOSIS — M6281 Muscle weakness (generalized): Secondary | ICD-10-CM | POA: Diagnosis not present

## 2021-02-10 NOTE — Therapy (Addendum)
[place], [place], [address] Phone: [phone]   Fax:  [phone]  Physical Therapy Treatment  Patient Details  Name: [name] MRN: [id number] Date of Birth: 10-27-1960 Referring Provider (PT): Charlott Rakes, MD   Encounter Date: 02/10/2021   PT End of Session - 02/10/21 1124     Visit Number 9    Number of Visits 16    Date for PT Re-Evaluation 02/21/21    Authorization Type UHC MCR    PT Start Time 6962    PT Stop Time 1202    PT Time Calculation (min) 38 min    Activity Tolerance Patient tolerated treatment well    Behavior During Therapy [facility] for tasks assessed/performed             Past Medical History:  Diagnosis Date   Headache    Hypertension    Iron deficiency anemia    Obesity    Seizures ([place])    Stroke ([place])    Type 2 diabetes mellitus ([place])     Past Surgical History:  Procedure Laterality Date   CARDIAC CATHETERIZATION  04/2010   Dr. Terrence Dupont   COLONOSCOPY WITH PROPOFOL N/A 04/06/2019   Procedure: COLONOSCOPY WITH PROPOFOL;  Surgeon: Mauri Pole, MD;  [facility];  Service: Endoscopy;  Laterality: N/A;   POLYPECTOMY  04/06/2019   Procedure: POLYPECTOMY;  Surgeon: Mauri Pole, MD;  [facility];  Service: Endoscopy;;    There were no vitals filed for this visit.   Subjective Assessment - 02/10/21 1129     Subjective Pt reports that she as been getting up earlier and warming up with exercise while sitting in a chair beside the bed.  She reports this is helpful in getting up and moving earlier in the morning.  She is having 3/10 R knee pain.    Pertinent History Significant PMH: hx of stroke (2017), DM TII, anemia             [facility] Adult PT Treatment/Exercise:   Therapeutic Exercise: NuStep lvl 5 LE only x 5 min while taking subjective LAQ 3x10 ea - 12# Seated ball squeeze 4x10 Seated clamshell 4x10 black tband Seated  march - feet on 4in platform 2x10 10 lb STS x10 (limited by fatigue) Seated hamstring curl 3x10 - black tband  Concentrated on increased reps with decreased rest to increase endurance and get CV benefit    PT Short Term Goals - 01/30/21 1414       PT SHORT TERM GOAL #1   Title SIMMIE GARIN will be >75% HEP compliant to improve carryover between sessions and facilitate independent management of condition    Baseline 11/8: MET    Target Date 01/17/21               PT Long Term Goals - 12/27/20 1514       PT LONG TERM GOAL #1   Title [name] will improve the following MMTs to >/= 4/5 to show improvement in strength:  R knee ext and flexion  EVAL: 3/5  target date: 02/21/2021      PT LONG TERM GOAL #2   Title MARILEE DITOMMASO will improve 10 meter max gait speed to .2 m/s (.1 m/s MCID) to show functional improvement in ambulation  EVAL: .1 m/s  target date: 02/21/21      PT LONG TERM GOAL #3   Title DIM MEISINGER will improve 30''  STS (MCID 2) to >/= 6x to show improved LE strength and improved transfers  EVAL: 4x   target date: 02/21/21      PT LONG TERM GOAL #4   Title MINDI AKERSON will report >/= 50% decrease in pain from evaluation  EVAL: 10/10 max pain  target date: 02/21/21                   Plan - 02/10/21 1131     Clinical Impression Statement Overall, Himani is progressing well with therapy.  Pt reports no increase in baseline pain following therapy.  Today we concentrated on knee strengthening and hip strengthening.  Pt continues to progress seated exercise intensity and is limited in standing exercise by fatigue.  We are working to help her get out of bed earlier in the morning and have more activity during the day which seems to be going well; will continue to check in on this.  Pt will continue to benefit from skilled physical therapy to address remaining deficits and achieve listed goals.  Continue per POC.    PT  Treatment/Interventions ADLs/Self Care Home Management;Aquatic Therapy;Iontophoresis 19m/ml Dexamethasone;Electrical Stimulation;Gait training;Therapeutic activities;Therapeutic exercise;Neuromuscular re-education;Manual techniques;Dry needling;Vasopneumatic Device;Joint Manipulations    PT Next Visit Plan Discuss high sugar foods and follow up with nutritionist, progressive R knee strengthening, increase general activity tolerance    PT Home Exercise Plan [facility]            Patient will benefit from skilled therapeutic intervention in order to improve the following deficits and impairments:  Abnormal gait, Decreased balance, Difficulty walking, Obesity, Pain, Decreased strength, Decreased activity tolerance, Cardiopulmonary status limiting activity  Visit Diagnosis: Chronic pain of right knee  Chronic pain of left knee  Other abnormalities of gait and mobility  Balance problem  Muscle weakness     Problem List Patient Active Problem List   Diagnosis Date Noted   Gait disturbance, post-stroke 06/12/2020   Cellulitis of left lower extremity 03/14/2020   Leukocytosis 03/14/2020   Rotator cuff impingement syndrome, right 03/09/2020   Chronic right shoulder pain 03/09/2020   Acute hypoxemic respiratory failure due to COVID-19 (HT[place] 11/10/2019   Urinary frequency 06/24/2019   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   BRBPR (bright red blood per rectum)    Polyp of cecum    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[place] 12/08/2018   Empty sella (HCoinj[place] 02/24/2018   Morbid obesity due to excess calories ([place] 02/05/2018   Abnormality of gait following cerebrovascular accident (CVA) 10/31/2017   History of stroke 07/16/2017   Headache 04/02/2017   Seizure disorder (HL[place] 10/02/2016   Obstructive sleep apnea 11/29/2015   Right shoulder pain 11/29/2015   Adjustment disorder with depressed mood 10/19/2015   Nocturnal enuresis    Hypokalemia    Anemia, iron deficiency     Super obese    Controlled type 2 diabetes mellitus without complication (HCC)    Orthostatic hypotension    Supplemental oxygen dependent    Diastolic dysfunction    Tachypnea    AKI (acute kidney injury) (HEno[place]    Acute blood loss anemia    Lethargy    Wheezing    Benign essential HTN    ICH (intracerebral hemorrhage) (HF[place] 10/06/2015    KMathis Dad PT 02/10/2021, 12:05 PM  CRankinCPointe [facility] Sage [address]GSierra Madre [place] [address]Phone: [phone]  Fax:  [phone] Name: TEVANGELYNE LOJAMRN: [id number] of Birth: [birth year]

## 2021-02-13 ENCOUNTER — Other Ambulatory Visit: Payer: Self-pay

## 2021-02-13 ENCOUNTER — Ambulatory Visit: Payer: Medicare Other

## 2021-02-13 DIAGNOSIS — M6281 Muscle weakness (generalized): Secondary | ICD-10-CM

## 2021-02-13 DIAGNOSIS — M25562 Pain in left knee: Secondary | ICD-10-CM | POA: Diagnosis not present

## 2021-02-13 DIAGNOSIS — R2689 Other abnormalities of gait and mobility: Secondary | ICD-10-CM | POA: Diagnosis not present

## 2021-02-13 DIAGNOSIS — G8929 Other chronic pain: Secondary | ICD-10-CM | POA: Diagnosis not present

## 2021-02-13 DIAGNOSIS — M25561 Pain in right knee: Secondary | ICD-10-CM | POA: Diagnosis not present

## 2021-02-13 NOTE — Therapy (Signed)
[place], [place], [address] Phone: [phone]   Fax:  [phone]  Physical Therapy Treatment/Progress Note  Progress Note Reporting Period 12/27/20 to 02/13/21  See note below for Objective Data and Assessment of Progress/Goals.    Patient Details  Name: [name] MRN: [id number] Date of Birth: July 07, 1960 Referring Provider (PT): Charlott Rakes, MD   Encounter Date: 02/13/2021   PT End of Session - 02/13/21 1451     Visit Number 10    Number of Visits 16    Date for PT Re-Evaluation 02/21/21    Authorization Type UHC MCR    PT Start Time 7425    PT Stop Time 1530    PT Time Calculation (min) 39 min    Activity Tolerance Patient tolerated treatment well    Behavior During Therapy [facility] for tasks assessed/performed             Past Medical History:  Diagnosis Date   Headache    Hypertension    Iron deficiency anemia    Obesity    Seizures ([place])    Stroke ([place])    Type 2 diabetes mellitus ([place])     Past Surgical History:  Procedure Laterality Date   CARDIAC CATHETERIZATION  04/2010   Dr. Terrence Dupont   COLONOSCOPY WITH PROPOFOL N/A 04/06/2019   Procedure: COLONOSCOPY WITH PROPOFOL;  Surgeon: Mauri Pole, MD;  [facility];  Service: Endoscopy;  Laterality: N/A;   POLYPECTOMY  04/06/2019   Procedure: POLYPECTOMY;  Surgeon: Mauri Pole, MD;  [facility];  Service: Endoscopy;;    There were no vitals filed for this visit.   Subjective Assessment - 02/13/21 1451     Subjective Pt presents to PT with reports of only slight R knee pain. She states that she is feeling much better today compared to previous last few weeks. Is ready to begin PT at this time.    Currently in Pain? Yes    Pain Score 2     Pain Location Knee    Pain Orientation Right           [facility] Adult PT Treatment/Exercise:   Therapeutic Exercise:  LAQ 3x10 ea - 12# Seated  clamshell 3x15 black tband Seated march - feet on 4in platform 2x20 black tband Seated hamstring curl 3x10 - black tband  Past Interventions Not Performed Today: NuStep lvl 5 LE only x 5 min while taking subjective Seated ball squeeze 4x10 STS x10 (limited by fatigue)  Manual Therapy: N/A   Neuromuscular re-ed: N/A   Therapeutic Activity: Assessment of tests/measures and LTGs for purposes of progress note   Modalities: N/A   Self Care: N/A   Consider / progression for next session:      [facility] PT Assessment - 02/13/21 0001       Sit to Stand   Comments 30" STS: 6 reps      Other:   Other/ Comments 5mgait speed: .121m w/ SPC      Strength   Overall Strength Comments bilat knee ext 5/5; bilat hip flex 3+/5                                      PT Short Term Goals - 01/30/21 1414       PT SHORT TERM GOAL #1   Title Mary BUSKEill be >75% HEP  compliant to improve carryover between sessions and facilitate independent management of condition    Baseline 11/8: MET    Target Date 01/17/21               PT Long Term Goals - 02/13/21 1458       PT LONG TERM GOAL #1   Title [name] will improve the following MMTs to >/= 4/5 to show improvement in strength:  R knee ext and flexion  EVAL: 3/5  target date: 02/21/2021    Baseline 5/5 knee ext; 3+/5 hip flex on 11/22    Status Partially Met    Target Date 02/21/21      PT LONG TERM GOAL #2   Title [name] will improve 10 meter max gait speed to .2 m/s (.1 m/s MCID) to show functional improvement in ambulation  EVAL: .1 m/s  target date: 02/21/21    Baseline .1 m/s on 11/22    Status On-going    Target Date 02/21/21      PT LONG TERM GOAL #3   Title [name] will improve 30'' STS (MCID 2) to >/= 6x to show improved LE strength and improved transfers  EVAL: 4x   target date: 02/21/21    Baseline 6 reps on 11/22    Status Achieved      PT LONG  TERM GOAL #4   Title [name] will report >/= 50% decrease in pain from evaluation  EVAL: 10/10 max pain  target date: 02/21/21    Baseline reports 5/10 pain at worst - agrees that she is 50% improved    Status Achieved                   Plan - 02/13/21 1516     Clinical Impression Statement Pt was able to complete prescribed exercises with no adverse effect or change in baseline. Over the course of PT treatment she has greatly decreased her bilateral knee pain and improved quad strength. Her functional LE strength and mobility has improved, as evidenced by meeting 30" STS LTG. She continues to have bilateral hip flexor weakness as well as signifcantly decreased gait speed that has remained unchanged since eval. PT will continue to progress exercises as tolerated per POC.    PT Treatment/Interventions ADLs/Self Care Home Management;Aquatic Therapy;Iontophoresis 35m/ml Dexamethasone;Electrical Stimulation;Gait training;Therapeutic activities;Therapeutic exercise;Neuromuscular re-education;Manual techniques;Dry needling;Vasopneumatic Device;Joint Manipulations    PT Next Visit Plan progressive R knee strengthening, increase general activity tolerance    PT [facility]            Patient will benefit from skilled therapeutic intervention in order to improve the following deficits and impairments:  Abnormal gait, Decreased balance, Difficulty walking, Obesity, Pain, Decreased strength, Decreased activity tolerance, Cardiopulmonary status limiting activity  Visit Diagnosis: Chronic pain of right knee  Chronic pain of left knee  Other abnormalities of gait and mobility  Balance problem  Muscle weakness     Problem List Patient Active Problem List   Diagnosis Date Noted   Gait disturbance, post-stroke 06/12/2020   Cellulitis of left lower extremity 03/14/2020   Leukocytosis 03/14/2020   Rotator cuff impingement syndrome, right 03/09/2020    Chronic right shoulder pain 03/09/2020   Acute hypoxemic respiratory failure due to COVID-19 (HNorth [place] 11/10/2019   Urinary frequency 06/24/2019   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   BRBPR (bright red blood per rectum)    Polyp of cecum    Nontraumatic cortical hemorrhage  of cerebral hemisphere ([place]) 12/08/2018   Empty sella ([place]) 02/24/2018   Morbid obesity due to excess calories ([place]) 02/05/2018   Abnormality of gait following cerebrovascular accident (CVA) 10/31/2017   History of stroke 07/16/2017   Headache 04/02/2017   Seizure disorder ([place]) 10/02/2016   Obstructive sleep apnea 11/29/2015   Right shoulder pain 11/29/2015   Adjustment disorder with depressed mood 10/19/2015   Nocturnal enuresis    Hypokalemia    Anemia, iron deficiency    Super obese    Controlled type 2 diabetes mellitus without complication (HCC)    Orthostatic hypotension    Supplemental oxygen dependent    Diastolic dysfunction    Tachypnea    AKI (acute kidney injury) ([place])    Acute blood loss anemia    Lethargy    Wheezing    Benign essential HTN    ICH (intracerebral hemorrhage) ([place]) 10/06/2015    Ward Chatters, PT 02/13/2021, 3:36 PM  [place] [facility] [place] [address] [place], [place], [address] Phone: [phone]   Fax:  [phone]  Name: [name] MRN: [id number] Date of Birth: 02-15-1961

## 2021-02-19 ENCOUNTER — Other Ambulatory Visit: Payer: Self-pay

## 2021-02-19 ENCOUNTER — Ambulatory Visit: Payer: Medicare Other

## 2021-02-19 DIAGNOSIS — R2689 Other abnormalities of gait and mobility: Secondary | ICD-10-CM

## 2021-02-19 DIAGNOSIS — G8929 Other chronic pain: Secondary | ICD-10-CM | POA: Diagnosis not present

## 2021-02-19 DIAGNOSIS — M25562 Pain in left knee: Secondary | ICD-10-CM | POA: Diagnosis not present

## 2021-02-19 DIAGNOSIS — M6281 Muscle weakness (generalized): Secondary | ICD-10-CM

## 2021-02-19 DIAGNOSIS — M25561 Pain in right knee: Secondary | ICD-10-CM | POA: Diagnosis not present

## 2021-02-19 NOTE — Therapy (Signed)
[place], [place], [address] Phone: [phone]   Fax:  [phone]  Physical Therapy Treatment  Patient Details  Name: [name] MRN: [id number] Date of Birth: 03/14/1961 Referring Provider (PT): Charlott Rakes, MD   Encounter Date: 02/19/2021   PT End of Session - 02/19/21 1406     Visit Number 11    Number of Visits 16    Date for PT Re-Evaluation 02/21/21    Authorization Type UHC MCR    PT Start Time 1402    PT Stop Time 1442    PT Time Calculation (min) 40 min    Activity Tolerance Patient tolerated treatment well    Behavior During Therapy [facility] for tasks assessed/performed             Past Medical History:  Diagnosis Date   Headache    Hypertension    Iron deficiency anemia    Obesity    Seizures ([place])    Stroke ([place])    Type 2 diabetes mellitus ([place])     Past Surgical History:  Procedure Laterality Date   CARDIAC CATHETERIZATION  04/2010   Dr. Terrence Dupont   COLONOSCOPY WITH PROPOFOL N/A 04/06/2019   Procedure: COLONOSCOPY WITH PROPOFOL;  Surgeon: Mauri Pole, MD;  Location: [facility];  Service: Endoscopy;  Laterality: N/A;   POLYPECTOMY  04/06/2019   Procedure: POLYPECTOMY;  Surgeon: Mauri Pole, MD;  Location: [facility];  Service: Endoscopy;;    There were no vitals filed for this visit.   Subjective Assessment - 02/19/21 1407     Subjective Pt presents to PT with reports of R knee pain. She has been compliant with HEP with no adverse effect. She is ready to begin PT at this time.    Currently in Pain? Yes    Pain Score 4     Pain Location Knee    Pain Orientation Right           [facility] Adult PT Treatment/Exercise:   Therapeutic Exercise:  NuStep lvl 5 LE only x 5 min while taking subjective LAQ 3x10 ea - 12# Seated clamshell 3x15 black tband Seated march - feet on 4in platform 2x20 black tband Seated hamstring curl 3x10 - black tband STS x  10   Past Interventions Not Performed Today: Seated ball squeeze 4x10   Manual Therapy: N/A   Neuromuscular re-ed: N/A   Therapeutic Activity: N/A   Modalities: N/A   Self Care: N/A   Consider / progression for next session:                                PT Short Term Goals - 01/30/21 1414       PT SHORT TERM GOAL #1   Title [name] will be >75% HEP compliant to improve carryover between sessions and facilitate independent management of condition    Baseline 11/8: MET    Target Date 01/17/21               PT Long Term Goals - 02/13/21 1458       PT LONG TERM GOAL #1   Title [name] will improve the following MMTs to >/= 4/5 to show improvement in strength:  R knee ext and flexion  EVAL: 3/5  target date: 02/21/2021    Baseline 5/5 knee ext; 3+/5 hip flex on 11/22    Status Partially  Met    Target Date 02/21/21      PT LONG TERM GOAL #2   Title [name] will improve 10 meter max gait speed to .2 m/s (.1 m/s MCID) to show functional improvement in ambulation  EVAL: .1 m/s  target date: 02/21/21    Baseline .1 m/s on 11/22    Status On-going    Target Date 02/21/21      PT LONG TERM GOAL #3   Title [name] will improve 30'' STS (MCID 2) to >/= 6x to show improved LE strength and improved transfers  EVAL: 4x   target date: 02/21/21    Baseline 6 reps on 11/22    Status Achieved      PT LONG TERM GOAL #4   Title [name] will report >/= 50% decrease in pain from evaluation  EVAL: 10/10 max pain  target date: 02/21/21    Baseline reports 5/10 pain at worst - agrees that she is 50% improved    Status Achieved                   Plan - 02/19/21 1411     Clinical Impression Statement Pt was again able to complete prescribed exercises with on adverse effect or increase in pain. Today's therapy session continued to progress exercises with focus on proximal hip and quad  strengthening in order to decrease pain and improve functional mobility. She continues to benefit from skilled PT services and will progress as able. Will as also assess goals and continued need for PT at next session.    PT Treatment/Interventions ADLs/Self Care Home Management;Aquatic Therapy;Iontophoresis 68m/ml Dexamethasone;Electrical Stimulation;Gait training;Therapeutic activities;Therapeutic exercise;Neuromuscular re-education;Manual techniques;Dry needling;Vasopneumatic Device;Joint Manipulations    PT Next Visit Plan progressive R knee strengthening, increase general activity tolerance    [facility]            Patient will benefit from skilled therapeutic intervention in order to improve the following deficits and impairments:  Abnormal gait, Decreased balance, Difficulty walking, Obesity, Pain, Decreased strength, Decreased activity tolerance, Cardiopulmonary status limiting activity  Visit Diagnosis: Chronic pain of right knee  Chronic pain of left knee  Other abnormalities of gait and mobility  Balance problem  Muscle weakness     Problem List Patient Active Problem List   Diagnosis Date Noted   Gait disturbance, post-stroke 06/12/2020   Cellulitis of left lower extremity 03/14/2020   Leukocytosis 03/14/2020   Rotator cuff impingement syndrome, right 03/09/2020   Chronic right shoulder pain 03/09/2020   Acute hypoxemic respiratory failure due to COVID-19 ([place] 11/10/2019   Urinary frequency 06/24/2019   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   BRBPR (bright red blood per rectum)    Polyp of cecum    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[place] 12/08/2018   Empty sella ([place] 02/24/2018   Morbid obesity due to excess calories ([place] 02/05/2018   Abnormality of gait following cerebrovascular accident (CVA) 10/31/2017   History of stroke 07/16/2017   Headache 04/02/2017   Seizure disorder ([place] 10/02/2016   Obstructive sleep apnea  11/29/2015   Right shoulder pain 11/29/2015   Adjustment disorder with depressed mood 10/19/2015   Nocturnal enuresis    Hypokalemia    Anemia, iron deficiency    Super obese    Controlled type 2 diabetes mellitus without complication (HCC)    Orthostatic hypotension    Supplemental oxygen dependent    Diastolic dysfunction    Tachypnea  AKI (acute kidney injury) ([place])    Acute blood loss anemia    Lethargy    Wheezing    Benign essential HTN    ICH (intracerebral hemorrhage) ([place]) 10/06/2015    Ward Chatters, PT 02/19/2021, 3:28 PM  [facility] [address] [place], [place], [address] Phone: [phone]   Fax:  [phone]  Name: ARIANIE COUSE MRN: [id number] Date of Birth: Jul 09, 1960

## 2021-02-21 ENCOUNTER — Other Ambulatory Visit: Payer: Self-pay

## 2021-02-21 ENCOUNTER — Ambulatory Visit: Payer: Medicare Other

## 2021-02-21 DIAGNOSIS — G8929 Other chronic pain: Secondary | ICD-10-CM

## 2021-02-21 DIAGNOSIS — M25561 Pain in right knee: Secondary | ICD-10-CM | POA: Diagnosis not present

## 2021-02-21 DIAGNOSIS — M6281 Muscle weakness (generalized): Secondary | ICD-10-CM | POA: Diagnosis not present

## 2021-02-21 DIAGNOSIS — M25562 Pain in left knee: Secondary | ICD-10-CM

## 2021-02-21 DIAGNOSIS — R2689 Other abnormalities of gait and mobility: Secondary | ICD-10-CM

## 2021-02-21 NOTE — Therapy (Signed)
[place], [place], [address] Phone: [phone]   Fax:  [phone]  Physical Therapy Treatment  Patient Details  Name: [name] MRN: [id number] Date of Birth: Mar 31, 1960 Referring Provider (PT): Charlott Rakes, MD   Encounter Date: 02/21/2021   PT End of Session - 02/21/21 1415     Visit Number 12    Number of Visits 16    Date for PT Re-Evaluation 02/21/21    Authorization Type UHC MCR    PT Start Time 0923    PT Stop Time 1436    PT Time Calculation (min) 23 min    Activity Tolerance Patient tolerated treatment well    Behavior During Therapy [facility] for tasks assessed/performed             Past Medical History:  Diagnosis Date   Headache    Hypertension    Iron deficiency anemia    Obesity    Seizures ([place])    Stroke ([place])    Type 2 diabetes mellitus ([place])     Past Surgical History:  Procedure Laterality Date   CARDIAC CATHETERIZATION  04/2010   Dr. Terrence Dupont   COLONOSCOPY WITH PROPOFOL N/A 04/06/2019   Procedure: COLONOSCOPY WITH PROPOFOL;  Surgeon: Mauri Pole, MD;  [facility];  Service: Endoscopy;  Laterality: N/A;   POLYPECTOMY  04/06/2019   Procedure: POLYPECTOMY;  Surgeon: Mauri Pole, MD;  [facility];  Service: Endoscopy;;    There were no vitals filed for this visit.   Subjective Assessment - 02/21/21 1416     Subjective Pt presents to PT with reports of continued R knee pain, although it is significantly decreaesd from start of therapy. She has been compliant with HEP with no adverse effect. She is ready to begin PT at this time.    Currently in Pain? Yes    Pain Score 3     Pain Location Knee    Pain Orientation Right           [facility] Adult PT Treatment/Exercise:   Therapeutic Exercise:  NuStep lvl 5 LE only x 5 min while taking subjective LAQ x 10 blue tband Seated clamshell x 15 black tband Seated march - x 20 black  tband Seated hamstring curl x 10 - blue tband Seated ball squeeze 4x10   Manual Therapy: N/A   Neuromuscular re-ed: N/A   Therapeutic Activity: N/A   Modalities: N/A   Self Care: N/A   Consider / progression for next session:     [facility] PT Assessment - 02/21/21 0001       Strength   Overall Strength Comments R hip flex 3+/5; L 4+/5                                      PT Short Term Goals - 01/30/21 1414       PT SHORT TERM GOAL #1   Title [name] will be >75% HEP compliant to improve carryover between sessions and facilitate independent management of condition    Baseline 11/8: MET    Target Date 01/17/21               PT Long Term Goals - 02/21/21 1418       PT LONG TERM GOAL #1   Title [name] will improve the following MMTs to >/= 4/5 to show  improvement in strength:  R knee ext and flexion  EVAL: 3/5  target date: 02/21/2021    Baseline 5/5 knee ext; 3+/5 hip flex on 11/22    Status Partially Met      PT LONG TERM GOAL #2   Title [name] will improve 10 meter max gait speed to .2 m/s (.1 m/s MCID) to show functional improvement in ambulation  EVAL: .1 m/s  target date: 02/21/21    Baseline .1 m/s on 11/22    Status Not Met      PT LONG TERM GOAL #3   Title [name] will improve 30'' STS (MCID 2) to >/= 6x to show improved LE strength and improved transfers  EVAL: 4x   target date: 02/21/21    Baseline 6 reps on 11/22    Status Achieved      PT LONG TERM GOAL #4   Title [name] will report >/= 50% decrease in pain from evaluation  EVAL: 10/10 max pain  target date: 02/21/21    Baseline reports 5/10 pain at worst - agrees that she is 50% improved    Status Achieved                   Plan - 02/21/21 1416     Clinical Impression Statement Pt was able to complete prescribed exercises and demonstrated knowledge of HEP with no adverse effect. Over the course of PT  treatment she has progressed very well with therapy, showing sharp decrease in R knee pain and general improving in functional ability and activity tolerance. She still demonstrates some R hip weakness and significantly slowed gait speed, however, she has improved and maximized current therapeutic benefit and should continue to improve with HEP compliance.    PT Treatment/Interventions ADLs/Self Care Home Management;Aquatic Therapy;Iontophoresis 56m/ml Dexamethasone;Electrical Stimulation;Gait training;Therapeutic activities;Therapeutic exercise;Neuromuscular re-education;Manual techniques;Dry needling;Vasopneumatic Device;Joint Manipulations    PT [facility]            Patient will benefit from skilled therapeutic intervention in order to improve the following deficits and impairments:  Abnormal gait, Decreased balance, Difficulty walking, Obesity, Pain, Decreased strength, Decreased activity tolerance, Cardiopulmonary status limiting activity  Visit Diagnosis: Chronic pain of right knee  Chronic pain of left knee  Other abnormalities of gait and mobility     Problem List Patient Active Problem List   Diagnosis Date Noted   Gait disturbance, post-stroke 06/12/2020   Cellulitis of left lower extremity 03/14/2020   Leukocytosis 03/14/2020   Rotator cuff impingement syndrome, right 03/09/2020   Chronic right shoulder pain 03/09/2020   Acute hypoxemic respiratory failure due to COVID-19 ([place] 11/10/2019   Urinary frequency 06/24/2019   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   BRBPR (bright red blood per rectum)    Polyp of cecum    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[place] 12/08/2018   Empty sella ([place] 02/24/2018   Morbid obesity due to excess calories ([place] 02/05/2018   Abnormality of gait following cerebrovascular accident (CVA) 10/31/2017   History of stroke 07/16/2017   Headache 04/02/2017   Seizure disorder ([place] 10/02/2016   Obstructive  sleep apnea 11/29/2015   Right shoulder pain 11/29/2015   Adjustment disorder with depressed mood 10/19/2015   Nocturnal enuresis    Hypokalemia    Anemia, iron deficiency    Super obese    Controlled type 2 diabetes mellitus without complication (HCC)    Orthostatic hypotension    Supplemental oxygen dependent  Diastolic dysfunction    Tachypnea    AKI (acute kidney injury) ([place])    Acute blood loss anemia    Lethargy    Wheezing    Benign essential HTN    ICH (intracerebral hemorrhage) ([place]) 10/06/2015    Ward Chatters, PT 02/21/2021, 2:45 PM  [facility] [address] [place], [place], [address] Phone: [phone]   Fax:  [phone]  Name: [name] MRN: [id number] Date of Birth: 1960-06-13  PHYSICAL THERAPY DISCHARGE SUMMARY  Visits from Start of Care: 12  Current functional level related to goals / functional outcomes: See goals and objective   Remaining deficits: See goals and objective   Education / Equipment: HEP   Patient agrees to discharge. Patient goals were  mostly met . Patient is being discharged due to being pleased with the current functional level.

## 2021-03-13 ENCOUNTER — Other Ambulatory Visit: Payer: Self-pay

## 2021-03-13 ENCOUNTER — Other Ambulatory Visit: Payer: Self-pay | Admitting: Family Medicine

## 2021-03-13 DIAGNOSIS — M25562 Pain in left knee: Secondary | ICD-10-CM

## 2021-03-13 MED ORDER — DICLOFENAC SODIUM 75 MG PO TBEC
DELAYED_RELEASE_TABLET | Freq: Two times a day (BID) | ORAL | 1 refills | Status: DC
Start: 2021-03-13 — End: 2021-08-16
  Filled 2021-03-13 – 2021-06-12 (×2): qty 60, 30d supply, fill #0
  Filled 2021-06-12: qty 60, 30d supply, fill #1

## 2021-03-13 MED FILL — Atorvastatin Calcium Tab 20 MG (Base Equivalent): ORAL | 30 days supply | Qty: 30 | Fill #0 | Status: AC

## 2021-03-13 MED FILL — Metoprolol Tartrate Tab 100 MG: ORAL | 30 days supply | Qty: 60 | Fill #0 | Status: AC

## 2021-04-16 ENCOUNTER — Other Ambulatory Visit: Payer: Self-pay

## 2021-04-16 MED FILL — Atorvastatin Calcium Tab 20 MG (Base Equivalent): ORAL | 30 days supply | Qty: 30 | Fill #0 | Status: AC

## 2021-04-17 ENCOUNTER — Other Ambulatory Visit: Payer: Self-pay

## 2021-04-18 ENCOUNTER — Other Ambulatory Visit: Payer: Self-pay

## 2021-04-20 ENCOUNTER — Other Ambulatory Visit: Payer: Self-pay

## 2021-05-03 ENCOUNTER — Telehealth: Payer: Self-pay | Admitting: *Deleted

## 2021-05-03 NOTE — Telephone Encounter (Signed)
Spoke with pt regarding f/u this coming Monday 2/13 at 7:45 AM. Patient reports she hasn't used her machine a long time because there was issues with the pressure. She still wants to be seen to discuss. Her resmed online portal has no data past December 2020. She also mentioned she had a couple falls, one about 1.5 months ago and another about 3 weeks ago. She hit her head during the first fall but she said it didn't hurt. She wanted to be seen. I let her know she should make an appt with her primary care to address the falls as this may not be neurologic in nature. Pt verbalized understanding. I advised her to bring her machine and power cord to the appointment on Monday.

## 2021-05-07 ENCOUNTER — Ambulatory Visit: Payer: Medicare Other | Admitting: Neurology

## 2021-05-07 ENCOUNTER — Telehealth: Payer: Self-pay | Admitting: Neurology

## 2021-05-07 NOTE — Telephone Encounter (Signed)
Cancel Pt appt Dr. Trenton Founds sick

## 2021-05-07 NOTE — Progress Notes (Signed)
PATIENT: [name] DOB: 1960/03/29  REASON FOR VISIT: follow up HISTORY FROM: patient  Chief Complaint  Patient presents with   Follow-up    RM 2, with husband. Hasn't used cpap in ~ 2 years. C/o pressure being too fast/difficult. C/o [facility] not helping.     HISTORY OF PRESENT ILLNESS: 05/08/21 ALL: [name] returns for follow up for OSA previously managed on CPAP. She was last seen 12/2018. At that time, she reported difficulty with compliance and felt that this was due to sinus/allergy symptoms. AHI was elevated and settings were changed from set pressure of 12cmH20 to AutoPAP with pressure range of 5-15cmH20. She used CPAP for about 2-3 more months then discontinued due to feeling that she couldn't breath when using her machine. Last usage 02/2019. Today, she returns requesting to resume CPAP. She states that she can not sleep due to waking up coughing. She wakes with a dry mouth. She is not able to lie flat for long periods of time due to feeling that she can't breathe. She is following with PCP as directed. She continues BP, DM and HLD management. No recent stroke symptoms. BP remains elevated. LDL was 48 in 05/2020. A1C 7.5 11/2020. She has not taken levetiracetam in about 2 years. No obvious seizure activity.     01/21/2019 ALL:  [name] is a 61 y.o. female here today for follow up for OSA on CPAP.  She is continue to work on compliance.  She received new supplies about a month ago.  She reports that sinus concerns have cleared up until the past week or so.  She is following closely with her primary care provider.  She is scheduled for follow-up on Monday.  Compliance data reviewed.  Compliance report dated 12/21/2018 through 01/19/2019 reveals that she has used CPAP 25 of the last 30 days for compliance of 83%.  She has used CPAP 17 of the last 30 days for compliance of 57%.  Average usage was 5 hours and 25 minutes.  AHI remains elevated at 18.6 on a set pressure of 12 cm of  water and an EPR of 3.  There is continued concern with a week noted in the 95th percentile of 37.6.  HISTORY: (copied from my note on 10/21/2018)  [name] is a 61 y.o. female here today for follow up of OSA on CPAP. She is also seen for history of ICH and seizure disorder. Last seen by Hoyle Sauer in 03/2018 with noted suboptimal compliance of CPAP therapy. She continues to have chronic sinus issues that she states prevents her form using CPAP. She is working with PCP for management. She is having increased headaches that she feels may be related. She was unaware of the link between headaches and sleep apnea. She has not had new supplies for CPAP in over a year. She has been cleaning the mask she has and reusing.    Compliance report dated 09/20/2018 through 10/19/2018 reveals that she has used CPAP therapy 14 out of the last 30 days for compliance of 47%.  7 of those days she used CPAP therapy for greater than 4 hours for compliance of 23%.  Average usage was 2 hours and 3 minutes.  AHI remains elevated at 13.1 on 12 cm of water.  There is a significant leak noted in the 95th percentile of 47.1.   She continues close follow up with PCP for chronic disease management. She continues BP, DMT2 and HLD medications as prescribed. She  is taking levetiracetam 735m twice daily for seizure management. No seizure activity noted.    HISTORY: (copied from [name] on 04/20/2018)   Ms. Mary CALOCAis a 61y.o. female with history of morbid obesity and hypertension admitted on 10/06/15 for LOC and GTC seizure. CT showed right temporal small ICH, and repeat CT stable hematoma. CTA and CTV head without AVM or CVT. CUS, DVT screening and TTE unremarkable. LTM  EEG no seizure. LD 58 and A1C 5.7. She was put on keppra 7531mbid. She was discharged to CIR with planning outpt sleep study.    11/29/15 follow up Dr. XuErlinda Hongthe patient has been doing well. No seizure activity. She is not driving so far. However, she  complains of right shoulder pain with limited ROM at right UE. Walks with cane at home. BP today 137/78 and also stable at home. BP controlled well with 2 BP meds.   Interval HistoryXU During the interval time, pt has been doing well. Had MRI and MRA in 11/2015 showed resolved bleeding and no AVM or aneurysm. He followed with Dr. PaPosey Prontoor right shoulder pain and was put on Voltaren gel which helped. She continued on keppra and no more seizures. She complains of turning head back from right lateral position fast causing dizzy, but it turning slow then no dizziness. Currently sleep study on hold due to no insurance. Still has outpt PT. BP today 132/78.   UPDATE 07/11/2018[name], [age]ear old female returns for follow-upwith a history of right temporal small ICH. Repeat CT stable and restarted on aspirin at her last visit.she has not had further stroke or TIA symptoms. She has minimal bruising or bleeding. She remains on Keppra for seizure disorder with no further seizure activity. She is back to driving without difficulty. She remains on Lipitor without complaints of myalgias. She is morbidly obese and needs a sleep study however insurance will not pay for this.She continues to see Dr. PaPosey Prontoor her right shoulder pain. She states that she is doing her home exercise program She returns for reevaluation without further neurologic symptoms   UPDATE 1/9/2019[name], [age]ear old female returns for follow-up degree of right temporal small ICH.  She is back on aspirin without further stroke or TIA symptoms.  No bleeding and no bruising.  She is also on Keppra for seizure disorder without side effects or recurrent seizures.  She remains on Lipitor stroke prevention without myalgias.  She is morbidly obese.  She now has Medicaid and can get her sleep study.  Has a lot of daytime drowsiness.She complains of intermittent headache.  She takes Tylenol.  She continues to see Dr. PaPosey Prontoor right shoulder pain.  She  uses Voltaren gel and Robaxin with fairly good pain relief.  She ambulates with a single-point cane she returns for reevaluation   Today, 10/08/2017:SA I reviewed her CPAP compliance data from 09/07/2017 through 10/06/2017 which is a total of 30 days, during which time she used her CPAP every night with percent used days greater than 4 hours at 83%, indicating very good compliance with an average usage of 5 hours and 42 minutes, residual AHI suboptimal at 10.4 per hour, leak on the high side with the 95th percentile at 28.1 L/m on a pressure of 12 cm with EPR of 3. She reports she has had some recurrent headaches. She tried to get a different mask today but the respiratory therapist was not at the DME office today. She reports that she  has noted some benefit including better sleep consolidation, better sleep quality, less nocturia but she is using a fullface mask and has noticed a leak which blows cold air into her face and she wakes up feeling very cold. She also tries to adjust the head gear and has had some pressure headaches from adjusting it too tightly. She now has a mask fit appointment on Monday. She is motivated to continue with treatment and also interested in working harder on weight loss. She has met with a nutritionist in the past.   UPDATE 1/27/2020[name], 61 year old female returns for follow-up with history of stroke right temporal small ICH.  She is back on aspirin without further stroke or TIA symptoms no bleeding and no bruising.  She is also on Keppra for seizure disorder without any recurrent seizures.  She remains on Lipitor without myalgias she is morbidly obese sleep study was positive for obstructive sleep apnea.  She continues to see [name] for right shoulder pain and mild right hemiparesis.  She had an ER admission back in October for acute maxillary sinusitis she was placed on antibiotics and was told not to restart her CPAP until her sinus infection cleared she never has  restarted her CPAP when last seen by Dr. Rexene Alberts in July she was 83% compliant at that time.  Compliance data dated 12/25/2017 to 01/23/2018 shows compliance greater than 4 hours at 60% less than 4 hours at 20% for time compliance of 80%.  Average usage 4 hours 47 minutes set pressure 12 cm AHI 3 ESS 11.  She stopped her CPAP shortly after this reading due to her sinus infection and never restarted.  She returns for reevaluation   REVIEW OF SYSTEMS: Out of a complete 14 system review of symptoms, the patient complains only of the following symptoms, headaches, weakness and all other reviewed systems are negative.  Epworth sleepiness scale: 14 Fatigue severity scale: 32  ALLERGIES: Allergies  Allergen Reactions   Bee Venom Swelling    Facial swelling   Eggs Or Egg-Derived Products Nausea And Vomiting and Swelling    Mouth swelled   Gabapentin Other (See Comments)    Jittery feeling, dyspnea, crawling sensation   Gadolinium Derivatives Itching    Immediately after gad injection pt. complained of tongue numbness and lower lip/ then had rt side of body tingling/ pt. Was assessed by Dr. Jeralyn Ruths before leaving/ no medications were administered/    HOME MEDICATIONS: Outpatient Medications Prior to Visit  Medication Sig Dispense Refill   Accu-Chek FastClix Lancets MISC Check blood sugar up to 3 times daily. E11.59 102 each 6   acetaminophen (TYLENOL) 500 MG tablet Take 500-1,000 mg by mouth every 6 (six) hours as needed for mild pain or headache.      atorvastatin (LIPITOR) 20 MG tablet TAKE 1 TABLET (20 MG TOTAL) BY MOUTH DAILY. 30 tablet 6   Blood Glucose Monitoring Suppl (ACCU-CHEK GUIDE ME) w/Device KIT 1 kit by Does not apply route daily. Check blood sugar up to 3 times daily. E11.59 1 kit 0   cetirizine (ZYRTEC) 10 MG tablet Take 10 mg by mouth daily as needed for allergies.      Colchicine (MITIGARE) 0.6 MG CAPS Take 1 capsule by mouth 2 (two) times daily as needed. 60 capsule 3   colchicine  0.6 MG tablet Take by mouth 2 (two) times daily as needed.     dapagliflozin propanediol (FARXIGA) 5 MG TABS tablet Take 1 tablet (5 mg total) by  mouth daily before breakfast. 30 tablet 3   diclofenac (VOLTAREN) 75 MG EC tablet TAKE 1 TABLET (75 MG TOTAL) BY MOUTH 2 (TWO) TIMES DAILY. 60 tablet 1   diclofenac Sodium (VOLTAREN) 1 % GEL Apply 1 application topically 4 (four) times daily as needed (pain.). OTC     diphenhydrAMINE (BENADRYL) 25 MG tablet Take 25 mg by mouth at bedtime.     fluticasone (FLONASE) 50 MCG/ACT nasal spray PLACE 1 SPRAY INTO BOTH NOSTRILS DAILY AS NEEDED FOR ALLERGIES OR RHINITIS. 16 g 6   glipiZIDE (GLUCOTROL) 5 MG tablet TAKE 1/2 TABLETS BY MOUTH 2 TIMES DAILY BEFORE A MEAL. 30 tablet 6   glucose blood (ACCU-CHEK GUIDE) test strip Check blood sugar up to 3 times daily. E11.59 100 each 6   hydroxypropyl methylcellulose / hypromellose (ISOPTO TEARS / GONIOVISC) 2.5 % ophthalmic solution Place 2 drops into both eyes as needed (irritation). (Patient taking differently: Place 2 drops into both eyes 4 (four) times daily as needed (irritation).) 15 mL 1   lidocaine (LIDODERM) 5 % Place 1 patch onto the skin daily. Remove & Discard patch within 12 hours or as directed by MD 30 patch 0   lisinopril (ZESTRIL) 20 MG tablet Take 1 tablet (20 mg total) by mouth daily. 30 tablet 6   meclizine (ANTIVERT) 25 MG tablet TAKE 1 TABLET (25 MG TOTAL) BY MOUTH 2 (TWO) TIMES DAILY AS NEEDED FOR DIZZINESS. 60 tablet 2   metFORMIN (GLUCOPHAGE-XR) 500 MG 24 hr tablet TAKE 2 TABLETS (1,000 MG TOTAL) BY MOUTH 2 (TWO) TIMES DAILY. 120 tablet 6   methocarbamol (ROBAXIN) 750 MG tablet TAKE 1 TABLET (750 MG TOTAL) BY MOUTH IN THE MORNING AND AT BEDTIME. 60 tablet 5   metoprolol tartrate (LOPRESSOR) 100 MG tablet TAKE 1 TABLET (100 MG TOTAL) BY MOUTH 2 (TWO) TIMES DAILY. 60 tablet 6   levETIRAcetam (KEPPRA) 500 MG tablet TAKE 1 & 1/2 TABLETS BY MOUTH TWICE DAILY. (Patient taking differently: Take 750 mg by  mouth 2 (two) times daily.) 90 tablet 8   Misc. Devices MISC Overnight pulse oximetry test.  Diagnosis obstructive sleep apnea (Patient not taking: Reported on 12/18/2020) 1 each 0   Misc. Devices MISC Discontinue Oxygen. Respiration- room air (Patient not taking: Reported on 12/18/2020) 1 each 0   No facility-administered medications prior to visit.    PAST MEDICAL HISTORY: Past Medical History:  Diagnosis Date   Headache    Hypertension    Iron deficiency anemia    Obesity    Seizures ([place])    Stroke ([place])    Type 2 diabetes mellitus ([place])     PAST SURGICAL HISTORY: Past Surgical History:  Procedure Laterality Date   CARDIAC CATHETERIZATION  04/2010   Dr. Terrence Dupont   COLONOSCOPY WITH PROPOFOL N/A 04/06/2019   Procedure: COLONOSCOPY WITH PROPOFOL;  Surgeon: Mauri Pole, MD;  Location: [facility];  Service: Endoscopy;  Laterality: N/A;   POLYPECTOMY  04/06/2019   Procedure: POLYPECTOMY;  Surgeon: Mauri Pole, MD;  Location: [facility];  Service: Endoscopy;;    FAMILY HISTORY: Family History  Problem Relation Age of Onset   Diabetes Father    Hypertension Father    Hypertension Sister    Stroke Brother     SOCIAL HISTORY: Social History   Socioeconomic History   Marital status: Married    Spouse name: Not on file   Number of children: Not on file   Years of education: Not on file   Highest education  level: Not on file  Occupational History   Not on file  Tobacco Use   Smoking status: Never   Smokeless tobacco: Never  Vaping Use   Vaping Use: Never used  Substance and Sexual Activity   Alcohol use: No   Drug use: No   Sexual activity: Not Currently  Other Topics Concern   Not on file  Social History Narrative   Not on file   Social Determinants of Health   Financial Resource Strain: Low Risk    Difficulty of Paying Living Expenses: Not hard at all  Food Insecurity: No Food Insecurity   Worried About Charity fundraiser in the Last Year:  Never true   [place] in the Last Year: Never true  Transportation Needs: No Transportation Needs   Lack of Transportation (Medical): No   Lack of Transportation (Non-Medical): No  Physical Activity: Inactive   Days of Exercise per Week: 0 days   Minutes of Exercise per Session: 0 min  Stress: No Stress Concern Present   Feeling of Stress : Not at all  Social Connections: Moderately Integrated   Frequency of Communication with Friends and Family: Once a week   Frequency of Social Gatherings with Friends and Family: Once a week   Attends Religious Services: More than 4 times per year   Active Member of [employer] or Organizations: No   Attends Music therapist: More than 4 times per year   Marital Status: Married  Human resources officer Violence: Not At Risk   Fear of Current or Ex-Partner: No   Emotionally Abused: No   Physically Abused: No   Sexually Abused: No      PHYSICAL EXAM  Vitals:   05/08/21 0842  BP: (!) 156/89  Pulse: 78  Weight: 298 lb (135.2 kg)  Height: '4\' 10"'  (1.473 m)    Body mass index is 62.28 kg/m.  Generalized: Well developed, in no acute distress  Cardiology: normal rate and rhythm, no murmur noted Respiratory: Clear to auscultation bilaterally Neurological examination  Mentation: Alert oriented to time, place, history taking. Follows all commands speech and language fluent Cranial nerve II-XII: Pupils were equal round reactive to light. Extraocular movements were full, visual field were full on confrontational test.  Motor: The motor testing reveals 5 over 5 strength of all 4 extremities. Good symmetric motor tone is noted throughout.  Gait and station: Gait is wide, stable with cane   DIAGNOSTIC DATA (LABS, IMAGING, TESTING) - I reviewed patient records, labs, notes, testing and imaging myself where available.  MMSE - Mini Mental State Exam 01/10/2021  Orientation to time 4  Orientation to Place 5  Registration 3  Attention/  Calculation 5  Recall 3  Language- name 2 objects 2  Language- repeat 1  Language- follow 3 step command 1  Language- read & follow direction 1  Write a sentence 1  Copy design 0  Total score 26     Lab Results  Component Value Date   WBC 11.4 (H) 03/12/2020   HGB 12.1 03/12/2020   HCT 40.8 03/12/2020   MCV 78.9 (L) 03/12/2020   PLT 311 03/12/2020      Component Value Date/Time   NA 143 06/14/2020 0911   K 3.7 06/14/2020 0911   CL 102 06/14/2020 0911   CO2 25 06/14/2020 0911   GLUCOSE 135 (H) 06/14/2020 0911   GLUCOSE 111 (H) 03/12/2020 1829   BUN 15 06/14/2020 0911   CREATININE 0.92 06/14/2020 0911  CREATININE 0.86 02/20/2016 0953   CALCIUM 9.4 06/14/2020 0911   PROT 8.9 (H) 03/12/2020 1829   PROT 8.0 10/23/2018 0936   ALBUMIN 3.7 03/12/2020 1829   ALBUMIN 3.7 (L) 10/23/2018 0936   AST 14 (L) 03/12/2020 1829   ALT 12 03/12/2020 1829   ALKPHOS 61 03/12/2020 1829   BILITOT 0.5 03/12/2020 1829   BILITOT 0.4 10/23/2018 0936   GFRNONAA 59 (L) 03/12/2020 1829   GFRNONAA 76 02/20/2016 0953   GFRAA >60 11/16/2019 0415   GFRAA 88 02/20/2016 0953   Lab Results  Component Value Date   CHOL 110 06/14/2020   HDL 43 06/14/2020   LDLCALC 48 06/14/2020   TRIG 98 06/14/2020   CHOLHDL 2.6 06/14/2020   Lab Results  Component Value Date   HGBA1C 7.5 (A) 12/18/2020   Lab Results  Component Value Date   LKTGYBWL89 373 05/18/2010   Lab Results  Component Value Date   TSH 1.99 02/24/2018       ASSESSMENT AND PLAN 61 y.o. year old female  has a past medical history of Headache, Hypertension, Iron deficiency anemia, Obesity, Seizures ([place]), Stroke ([place]), and Type 2 diabetes mellitus ([place]). here with     ICD-10-CM   1. Obstructive sleep apnea  G47.33 For home use only DME continuous positive airway pressure (CPAP)    For home use only DME continuous positive airway pressure (CPAP)    2. Nontraumatic cortical hemorrhage of cerebral hemisphere, unspecified laterality  (HCC)  I61.1     3. Seizure disorder ([place])  G40.909        Tenesia has had difficulty using CPAP and no recent data available for review.  I have encouraged her to resume usage and continue working towards a goal of nightly use and greater than 4 hours each night. I will ask DME to work with her to ensure correct fit of mask and desensitizing as needed. Last data available for review was in 2020. Once she is using CPAP, I will assess download and make setting adjustments as indicated. She will continue close follow-up with primary care for chronic sinus concerns as well as management of comorbidities. Seizure precautions advised. She has not taken AED in over 2 years. She will follow-up with me in 4-6 months, sooner if needed.  She verbalizes understanding and agreement with this plan.   Orders Placed This Encounter  Procedures   For home use only DME continuous positive airway pressure (CPAP)    Supplies    Order Specific Question:   Length of Need    Answer:   Lifetime    Order Specific Question:   Patient has OSA or probable OSA    Answer:   Yes    Order Specific Question:   Is the patient currently using CPAP in the home    Answer:   Yes    Order Specific Question:   Settings    Answer:   Other see comments    Order Specific Question:   CPAP supplies needed    Answer:   Mask, headgear, cushions, filters, heated tubing and water chamber   For home use only DME continuous positive airway pressure (CPAP)    Please call patient to trouble shoot any concerns of using CPAP. May need conditioning to resume regular usage.    Order Specific Question:   Length of Need    Answer:   Lifetime    Order Specific Question:   Patient has OSA or probable OSA  Answer:   Yes    Order Specific Question:   Is the patient currently using CPAP in the home    Answer:   Yes    Order Specific Question:   Settings    Answer:   Other see comments    Order Specific Question:   CPAP supplies needed     Answer:   Mask, headgear, cushions, filters, heated tubing and water chamber     No orders of the defined types were placed in this encounter.     I spent 30 minutes of face-to-face and non-face-to-face time with patient.  This included previsit chart review, lab review, study review, order entry, electronic health record documentation, patient education.   Debbora Presto, FNP-C 05/08/2021, 4:03 PM [name] [address] [place], [place] [address] [phone]

## 2021-05-07 NOTE — Patient Instructions (Addendum)
Please continue using your CPAP regularly. While your insurance requires that you use CPAP at least 4 hours each night on 70% of the nights, I recommend, that you not skip any nights and use it throughout the night if you can. Getting used to CPAP and staying with the treatment long term does take time and patience and discipline. Untreated obstructive sleep apnea when it is moderate to severe can have an adverse impact on cardiovascular health and raise her risk for heart disease, arrhythmias, hypertension, congestive heart failure, stroke and diabetes. Untreated obstructive sleep apnea causes sleep disruption, nonrestorative sleep, and sleep deprivation. This can have an impact on your day to day functioning and cause daytime sleepiness and impairment of cognitive function, memory loss, mood disturbance, and problems focussing. Using CPAP regularly can improve these symptoms.  DME: [facility] Phone: [phone]  We will send an order to [facility] to help you restart CPAP. Please work on using machine consistently. Once you are using the machine, I can review data and make adjustments as needed.   Follow up with me in 4-6 months

## 2021-05-08 ENCOUNTER — Ambulatory Visit (INDEPENDENT_AMBULATORY_CARE_PROVIDER_SITE_OTHER): Payer: Medicare Other | Admitting: Family Medicine

## 2021-05-08 ENCOUNTER — Telehealth: Payer: Self-pay | Admitting: Family Medicine

## 2021-05-08 ENCOUNTER — Other Ambulatory Visit: Payer: Self-pay

## 2021-05-08 ENCOUNTER — Encounter: Payer: Self-pay | Admitting: Family Medicine

## 2021-05-08 VITALS — BP 156/89 | HR 78 | Ht <= 58 in | Wt 298.0 lb

## 2021-05-08 DIAGNOSIS — I611 Nontraumatic intracerebral hemorrhage in hemisphere, cortical: Secondary | ICD-10-CM | POA: Diagnosis not present

## 2021-05-08 DIAGNOSIS — G40909 Epilepsy, unspecified, not intractable, without status epilepticus: Secondary | ICD-10-CM | POA: Diagnosis not present

## 2021-05-08 DIAGNOSIS — G4733 Obstructive sleep apnea (adult) (pediatric): Secondary | ICD-10-CM

## 2021-05-08 NOTE — Telephone Encounter (Signed)
Can you please confirm with pharmacy the dose of levetiracetam and who is filling. Per our records, last filled by Hoyle Sauer in 2020.

## 2021-05-08 NOTE — Telephone Encounter (Signed)
[facility] script was sent in 2020. Pharmacist states he doesn't see anything. States that their system changed over in April 2021 so she definitely has not got this filled since then from them.

## 2021-05-14 MED FILL — Atorvastatin Calcium Tab 20 MG (Base Equivalent): ORAL | 30 days supply | Qty: 30 | Fill #1 | Status: CN

## 2021-05-15 ENCOUNTER — Other Ambulatory Visit: Payer: Self-pay

## 2021-05-20 ENCOUNTER — Other Ambulatory Visit: Payer: Self-pay | Admitting: Family Medicine

## 2021-05-20 DIAGNOSIS — E1159 Type 2 diabetes mellitus with other circulatory complications: Secondary | ICD-10-CM

## 2021-05-21 ENCOUNTER — Other Ambulatory Visit: Payer: Self-pay

## 2021-05-21 MED ORDER — DAPAGLIFLOZIN PROPANEDIOL 5 MG PO TABS
5.0000 mg | ORAL_TABLET | Freq: Every day | ORAL | 0 refills | Status: DC
  Filled 2021-05-21: qty 30, 30d supply, fill #0

## 2021-05-22 ENCOUNTER — Other Ambulatory Visit: Payer: Self-pay

## 2021-05-22 MED FILL — Atorvastatin Calcium Tab 20 MG (Base Equivalent): ORAL | 30 days supply | Qty: 30 | Fill #1 | Status: AC

## 2021-05-28 ENCOUNTER — Telehealth: Payer: Self-pay | Admitting: Family Medicine

## 2021-05-28 NOTE — Telephone Encounter (Signed)
Called the pt and advised that in reviewing the download it looks like the patient when using it, is having air leaks with the masks. I have recommended the pt call the company to see about getting a mask fitting completed. She may be opening her mouth and a chinstrap also could be dicussed to see if that would help. Pt verbalized understanding. ?I provided her the DME company phone number and advised I would also let them know as well. Pt verbalized understanding. ? ?

## 2021-05-28 NOTE — Telephone Encounter (Signed)
Pt said, air on CPAP machine blowing out really strong. When pt trying to blow out machine air is blowing into the pt.. There is constance blowing of air. Would like a call from the nurse. ?

## 2021-05-29 DIAGNOSIS — G4733 Obstructive sleep apnea (adult) (pediatric): Secondary | ICD-10-CM | POA: Diagnosis not present

## 2021-06-12 ENCOUNTER — Other Ambulatory Visit: Payer: Self-pay

## 2021-06-12 MED FILL — Metoprolol Tartrate Tab 100 MG: ORAL | 30 days supply | Qty: 60 | Fill #1 | Status: CN

## 2021-06-12 MED FILL — Metoprolol Tartrate Tab 100 MG: ORAL | 30 days supply | Qty: 60 | Fill #0 | Status: AC

## 2021-07-01 ENCOUNTER — Other Ambulatory Visit: Payer: Self-pay | Admitting: Family Medicine

## 2021-07-01 DIAGNOSIS — E1159 Type 2 diabetes mellitus with other circulatory complications: Secondary | ICD-10-CM

## 2021-07-01 DIAGNOSIS — E785 Hyperlipidemia, unspecified: Secondary | ICD-10-CM

## 2021-07-01 DIAGNOSIS — I611 Nontraumatic intracerebral hemorrhage in hemisphere, cortical: Secondary | ICD-10-CM

## 2021-07-02 ENCOUNTER — Other Ambulatory Visit: Payer: Self-pay

## 2021-07-02 MED ORDER — DAPAGLIFLOZIN PROPANEDIOL 5 MG PO TABS
5.0000 mg | ORAL_TABLET | Freq: Every day | ORAL | 0 refills | Status: DC
  Filled 2021-07-02: qty 30, 30d supply, fill #0

## 2021-07-02 MED ORDER — ATORVASTATIN CALCIUM 20 MG PO TABS
ORAL_TABLET | Freq: Every day | ORAL | 0 refills | Status: DC
  Filled 2021-07-02: qty 30, 30d supply, fill #0

## 2021-07-03 ENCOUNTER — Other Ambulatory Visit: Payer: Self-pay

## 2021-07-19 ENCOUNTER — Other Ambulatory Visit: Payer: Self-pay | Admitting: Family Medicine

## 2021-07-19 DIAGNOSIS — E1159 Type 2 diabetes mellitus with other circulatory complications: Secondary | ICD-10-CM

## 2021-07-30 ENCOUNTER — Other Ambulatory Visit (HOSPITAL_COMMUNITY): Payer: Self-pay

## 2021-07-30 DIAGNOSIS — L309 Dermatitis, unspecified: Secondary | ICD-10-CM | POA: Diagnosis not present

## 2021-07-30 DIAGNOSIS — E785 Hyperlipidemia, unspecified: Secondary | ICD-10-CM | POA: Diagnosis not present

## 2021-07-30 DIAGNOSIS — I509 Heart failure, unspecified: Secondary | ICD-10-CM | POA: Diagnosis not present

## 2021-07-30 DIAGNOSIS — G4730 Sleep apnea, unspecified: Secondary | ICD-10-CM | POA: Diagnosis not present

## 2021-07-30 DIAGNOSIS — Z79899 Other long term (current) drug therapy: Secondary | ICD-10-CM | POA: Diagnosis not present

## 2021-07-30 DIAGNOSIS — D509 Iron deficiency anemia, unspecified: Secondary | ICD-10-CM | POA: Diagnosis not present

## 2021-07-30 DIAGNOSIS — R2681 Unsteadiness on feet: Secondary | ICD-10-CM | POA: Diagnosis not present

## 2021-07-30 DIAGNOSIS — M159 Polyosteoarthritis, unspecified: Secondary | ICD-10-CM | POA: Diagnosis not present

## 2021-07-30 DIAGNOSIS — I10 Essential (primary) hypertension: Secondary | ICD-10-CM | POA: Diagnosis not present

## 2021-07-30 DIAGNOSIS — E559 Vitamin D deficiency, unspecified: Secondary | ICD-10-CM | POA: Diagnosis not present

## 2021-07-30 DIAGNOSIS — Z1159 Encounter for screening for other viral diseases: Secondary | ICD-10-CM | POA: Diagnosis not present

## 2021-07-30 MED ORDER — TRIAMCINOLONE ACETONIDE 0.1 % EX OINT
TOPICAL_OINTMENT | Freq: Two times a day (BID) | CUTANEOUS | 1 refills | Status: DC
  Filled 2021-07-30: qty 45, 20d supply, fill #0
  Filled 2021-08-08: qty 60, 25d supply, fill #0

## 2021-08-06 ENCOUNTER — Other Ambulatory Visit (HOSPITAL_COMMUNITY): Payer: Self-pay

## 2021-08-06 MED ORDER — ERGOCALCIFEROL 1.25 MG (50000 UT) PO CAPS
50000.0000 [IU] | ORAL_CAPSULE | ORAL | 0 refills | Status: DC
  Filled 2021-08-06 – 2021-08-08 (×2): qty 12, 84d supply, fill #0

## 2021-08-08 ENCOUNTER — Other Ambulatory Visit: Payer: Self-pay

## 2021-08-08 ENCOUNTER — Other Ambulatory Visit (HOSPITAL_COMMUNITY): Payer: Self-pay

## 2021-08-13 ENCOUNTER — Other Ambulatory Visit (HOSPITAL_COMMUNITY): Payer: Self-pay

## 2021-08-14 ENCOUNTER — Other Ambulatory Visit (HOSPITAL_COMMUNITY): Payer: Self-pay

## 2021-08-15 ENCOUNTER — Other Ambulatory Visit (HOSPITAL_COMMUNITY): Payer: Self-pay

## 2021-08-15 ENCOUNTER — Other Ambulatory Visit: Payer: Self-pay

## 2021-08-15 ENCOUNTER — Other Ambulatory Visit: Payer: Self-pay | Admitting: Family Medicine

## 2021-08-15 DIAGNOSIS — I10 Essential (primary) hypertension: Secondary | ICD-10-CM

## 2021-08-15 DIAGNOSIS — E1169 Type 2 diabetes mellitus with other specified complication: Secondary | ICD-10-CM | POA: Diagnosis not present

## 2021-08-15 DIAGNOSIS — J302 Other seasonal allergic rhinitis: Secondary | ICD-10-CM

## 2021-08-15 DIAGNOSIS — I152 Hypertension secondary to endocrine disorders: Secondary | ICD-10-CM

## 2021-08-15 DIAGNOSIS — I771 Stricture of artery: Secondary | ICD-10-CM | POA: Diagnosis not present

## 2021-08-15 DIAGNOSIS — R42 Dizziness and giddiness: Secondary | ICD-10-CM

## 2021-08-15 DIAGNOSIS — G8929 Other chronic pain: Secondary | ICD-10-CM

## 2021-08-15 DIAGNOSIS — I611 Nontraumatic intracerebral hemorrhage in hemisphere, cortical: Secondary | ICD-10-CM

## 2021-08-15 DIAGNOSIS — E785 Hyperlipidemia, unspecified: Secondary | ICD-10-CM

## 2021-08-15 DIAGNOSIS — G4733 Obstructive sleep apnea (adult) (pediatric): Secondary | ICD-10-CM | POA: Diagnosis not present

## 2021-08-15 DIAGNOSIS — Z0001 Encounter for general adult medical examination with abnormal findings: Secondary | ICD-10-CM | POA: Diagnosis not present

## 2021-08-15 DIAGNOSIS — E1159 Type 2 diabetes mellitus with other circulatory complications: Secondary | ICD-10-CM

## 2021-08-15 DIAGNOSIS — D509 Iron deficiency anemia, unspecified: Secondary | ICD-10-CM | POA: Diagnosis not present

## 2021-08-15 NOTE — Telephone Encounter (Signed)
Medication Refill - Medication: atorvastatin (LIPITOR) 20 MG tablet diclofenac (VOLTAREN) 75 MG EC tablet  Farxiga 5 MG Tablets fluticasone (FLONASE) 50 MCG/ACT nasal spray  lisinopril (ZESTRIL) 20 MG tablet Loratidine 10 MG Capsules  Meclizine 25 MG Tablets metFORMIN (GLUCOPHAGE-XR) 500 MG 24 hr tablet metoprolol tartrate (LOPRESSOR) 100 MG tablet  Has the patient contacted their pharmacy? Yes.   (Agent: If no, request that the patient contact the pharmacy for the refill. If patient does not wish to contact the pharmacy document the reason why and proceed with request.) (Agent: If yes, when and what did the pharmacy advise?)  Preferred Pharmacy (with phone number or street name):  [facility] - [place] [place] [address]  Phone: [phone] Fax: [phone]   Has the patient been seen for an appointment in the last year OR does the patient have an upcoming appointment? Yes.    Agent: Please be advised that RX refills may take up to 3 business days. We ask that you follow-up with your pharmacy.

## 2021-08-16 NOTE — Telephone Encounter (Signed)
Medication not on current or history list: Loratidine 10 MG Capsules

## 2021-08-17 MED ORDER — METOPROLOL TARTRATE 100 MG PO TABS: ORAL_TABLET | Freq: Two times a day (BID) | ORAL | 0 refills | Status: DC

## 2021-08-17 MED ORDER — DICLOFENAC SODIUM 75 MG PO TBEC: DELAYED_RELEASE_TABLET | Freq: Two times a day (BID) | ORAL | 0 refills | Status: DC

## 2021-08-17 MED ORDER — METFORMIN HCL ER 500 MG PO TB24: ORAL_TABLET | Freq: Two times a day (BID) | ORAL | 0 refills | Status: DC

## 2021-08-17 MED ORDER — ATORVASTATIN CALCIUM 20 MG PO TABS: ORAL_TABLET | Freq: Every day | ORAL | 0 refills | Status: DC

## 2021-08-17 MED ORDER — LISINOPRIL 20 MG PO TABS: 20.0000 mg | ORAL_TABLET | Freq: Every day | ORAL | 0 refills | Status: DC

## 2021-08-17 MED ORDER — DAPAGLIFLOZIN PROPANEDIOL 5 MG PO TABS: 5.0000 mg | ORAL_TABLET | Freq: Every day | ORAL | 0 refills | Status: DC

## 2021-08-17 MED ORDER — FLUTICASONE PROPIONATE 50 MCG/ACT NA SUSP: NASAL | 0 refills | Status: DC

## 2021-08-17 NOTE — Telephone Encounter (Signed)
Requested medication (s) are due for refill today: Yes  Requested medication (s) are on the active medication list: Yes  Last refill:  1 month to 1 year ago  Future visit scheduled: No  Notes to clinic:  Unable to refill per protocol, appointment needed, unable to refill per protocol due to failed labs, no updated results, unable to refill per protocol, cannot delegate.       Requested Prescriptions  Pending Prescriptions Disp Refills   atorvastatin (LIPITOR) 20 MG tablet 30 tablet 0    Sig: TAKE 1 TABLET (20 MG TOTAL) BY MOUTH DAILY.     Cardiovascular:  Antilipid - Statins Failed - 08/16/2021  8:23 AM      Failed - Lipid Panel in normal range within the last 12 months    Cholesterol, Total  Date Value Ref Range Status  06/14/2020 110 100 - 199 mg/dL Final   LDL Chol Calc (NIH)  Date Value Ref Range Status  06/14/2020 48 0 - 99 mg/dL Final   HDL  Date Value Ref Range Status  06/14/2020 43 >39 mg/dL Final   Triglycerides  Date Value Ref Range Status  06/14/2020 98 0 - 149 mg/dL Final         Passed - Patient is not pregnant      Passed - Valid encounter within last 12 months    Recent Outpatient Visits           7 months ago Encounter for [employer] annual wellness exam   [place], [place], MD   8 months ago Type 2 diabetes mellitus with other circulatory complication, without long-term current use of insulin ([place])   [place], [place], MD   1 year ago Type 2 diabetes mellitus with other circulatory complication, without long-term current use of insulin ([place])   [place], Charlane Ferretti, MD   1 year ago Seasonal allergies   [place]in[place], [place], MD   1 year ago Idiopathic chronic gout of left foot without tophus   [place], [place], MD                diclofenac  (VOLTAREN) 75 MG EC tablet 60 tablet 1    Sig: TAKE 1 TABLET (75 MG TOTAL) BY MOUTH 2 (TWO) TIMES DAILY.     Analgesics:  NSAIDS Failed - 08/16/2021  8:23 AM      Failed - Manual Review: Labs are only required if the patient has taken medication for more than 8 weeks.      Failed - Cr in normal range and within 360 days    Creat  Date Value Ref Range Status  02/20/2016 0.86 0.50 - 1.05 mg/dL Final    Comment:      For patients > or = 61 years of age: The upper reference limit for Creatinine is approximately 13% higher for people identified as African-American.      Creatinine, Ser  Date Value Ref Range Status  06/14/2020 0.92 0.57 - 1.00 mg/dL Final         Failed - HGB in normal range and within 360 days    Hemoglobin  Date Value Ref Range Status  03/12/2020 12.1 12.0 - 15.0 g/dL Final  09/10/2016 11.5 11.1 - 15.9 g/dL Final         Failed - PLT in normal range and within 360 days  Platelets  Date Value Ref Range Status  03/12/2020 311 150 - 400 K/uL Final  09/10/2016 341 150 - 379 x10E3/uL Final         Failed - HCT in normal range and within 360 days    HCT  Date Value Ref Range Status  03/12/2020 40.8 36.0 - 46.0 % Final   Hematocrit  Date Value Ref Range Status  09/10/2016 36.4 34.0 - 46.6 % Final         Failed - eGFR is 30 or above and within 360 days    GFR, Est African American  Date Value Ref Range Status  02/20/2016 88 >=60 mL/min Final   GFR calc Af Amer  Date Value Ref Range Status  11/16/2019 >60 >60 mL/min Final   GFR, Est Non African American  Date Value Ref Range Status  02/20/2016 76 >=60 mL/min Final   GFR, Estimated  Date Value Ref Range Status  03/12/2020 59 (L) >60 mL/min Final    Comment:    (NOTE) Calculated using the CKD-EPI Creatinine Equation (2021)    GFR  Date Value Ref Range Status  02/24/2018 77.96 >60.00 mL/min Final   eGFR  Date Value Ref Range Status  06/14/2020 72 >59 mL/min/1.73 Final         Passed -  Patient is not pregnant      Passed - Valid encounter within last 12 months    Recent Outpatient Visits           7 months ago Encounter for [employer] annual wellness exam   [place], [place], MD   8 months ago Type 2 diabetes mellitus with other circulatory complication, without long-term current use of insulin ([place])   [place], Sheldon, MD   1 year ago Type 2 diabetes mellitus with other circulatory complication, without long-term current use of insulin ([place])   [place], [place], MD   1 year ago Seasonal allergies   [place], Charlane Ferretti, MD   1 year ago Idiopathic chronic gout of left foot without tophus   [place], Charlane Ferretti, MD                dapagliflozin propanediol (FARXIGA) 5 MG TABS tablet 30 tablet 0    Sig: Take 1 tablet (5 mg total) by mouth daily before breakfast.     Endocrinology:  Diabetes - SGLT2 Inhibitors Failed - 08/16/2021  8:23 AM      Failed - Cr in normal range and within 360 days    Creat  Date Value Ref Range Status  02/20/2016 0.86 0.50 - 1.05 mg/dL Final    Comment:      For patients > or = 61 years of age: The upper reference limit for Creatinine is approximately 13% higher for people identified as African-American.      Creatinine, Ser  Date Value Ref Range Status  06/14/2020 0.92 0.57 - 1.00 mg/dL Final         Failed - HBA1C is between 0 and 7.9 and within 180 days    HbA1c, POC (controlled diabetic range)  Date Value Ref Range Status  12/18/2020 7.5 (A) 0.0 - 7.0 % Final         Failed - eGFR in normal range and within 360 days    GFR, Est African American  Date Value Ref Range Status  02/20/2016 88 >=60 mL/min Final   GFR calc Af Amer  Date Value Ref Range Status  11/16/2019 >60 >60 mL/min Final   GFR, Est Non African American  Date  Value Ref Range Status  02/20/2016 76 >=60 mL/min Final   GFR, Estimated  Date Value Ref Range Status  03/12/2020 59 (L) >60 mL/min Final    Comment:    (NOTE) Calculated using the CKD-EPI Creatinine Equation (2021)    GFR  Date Value Ref Range Status  02/24/2018 77.96 >60.00 mL/min Final   eGFR  Date Value Ref Range Status  06/14/2020 72 >59 mL/min/1.73 Final         Failed - Valid encounter within last 6 months    Recent Outpatient Visits           7 months ago Encounter for [employer] annual wellness exam   [place], [place], MD   8 months ago Type 2 diabetes mellitus with other circulatory complication, without long-term current use of insulin ([place])   [place], [place], MD   1 year ago Type 2 diabetes mellitus with other circulatory complication, without long-term current use of insulin ([place])   [place], Charlane Ferretti, MD   1 year ago Seasonal allergies   [place], [place], MD   1 year ago Idiopathic chronic gout of left foot without tophus   [place], [place], MD                fluticasone (FLONASE) 50 MCG/ACT nasal spray 16 g 6    Sig: PLACE 1 SPRAY INTO BOTH NOSTRILS DAILY AS NEEDED FOR ALLERGIES OR RHINITIS.     Ear, Nose, and Throat: Nasal Preparations - Corticosteroids Passed - 08/16/2021  8:23 AM      Passed - Valid encounter within last 12 months    Recent Outpatient Visits           7 months ago Encounter for [employer] annual wellness exam   [place], [place], MD   8 months ago Type 2 diabetes mellitus with other circulatory complication, without long-term current use of insulin ([place])   [place], [place], MD   1 year ago Type 2 diabetes mellitus with other circulatory complication,  without long-term current use of insulin ([place])   [place], Charlane Ferretti, MD   1 year ago Seasonal allergies   [place], Charlane Ferretti, MD   1 year ago Idiopathic chronic gout of left foot without tophus   [place], Charlane Ferretti, MD                lisinopril (ZESTRIL) 20 MG tablet 30 tablet 6    Sig: Take 1 tablet (20 mg total) by mouth daily.     Cardiovascular:  ACE Inhibitors Failed - 08/16/2021  8:23 AM      Failed - Cr in normal range and within 180 days    Creat  Date Value Ref Range Status  02/20/2016 0.86 0.50 - 1.05 mg/dL Final    Comment:      For patients > or = 61 years of age: The upper reference limit for Creatinine is approximately 13% higher for people identified as African-American.      Creatinine, Ser  Date  Value Ref Range Status  06/14/2020 0.92 0.57 - 1.00 mg/dL Final         Failed - K in normal range and within 180 days    Potassium  Date Value Ref Range Status  06/14/2020 3.7 3.5 - 5.2 mmol/L Final         Failed - Last BP in normal range    BP Readings from Last 1 Encounters:  05/08/21 (!) 156/89         Failed - Valid encounter within last 6 months    Recent Outpatient Visits           7 months ago Encounter for [employer] annual wellness exam   [place], [place], MD   8 months ago Type 2 diabetes mellitus with other circulatory complication, without long-term current use of insulin ([place])   [place], [place], MD   1 year ago Type 2 diabetes mellitus with other circulatory complication, without long-term current use of insulin ([place])   [place], Charlane Ferretti, MD   1 year ago Seasonal allergies   [place], [place], MD   1 year ago Idiopathic chronic gout of left foot without tophus    [place], Charlane Ferretti, MD               Passed - Patient is not pregnant       metFORMIN (GLUCOPHAGE-XR) 500 MG 24 hr tablet 120 tablet 6    Sig: TAKE 2 TABLETS (1,000 MG TOTAL) BY MOUTH 2 (TWO) TIMES DAILY.     Endocrinology:  Diabetes - Biguanides Failed - 08/16/2021  8:23 AM      Failed - Cr in normal range and within 360 days    Creat  Date Value Ref Range Status  02/20/2016 0.86 0.50 - 1.05 mg/dL Final    Comment:      For patients > or = 61 years of age: The upper reference limit for Creatinine is approximately 13% higher for people identified as African-American.      Creatinine, Ser  Date Value Ref Range Status  06/14/2020 0.92 0.57 - 1.00 mg/dL Final         Failed - HBA1C is between 0 and 7.9 and within 180 days    HbA1c, POC (controlled diabetic range)  Date Value Ref Range Status  12/18/2020 7.5 (A) 0.0 - 7.0 % Final         Failed - eGFR in normal range and within 360 days    GFR, Est African American  Date Value Ref Range Status  02/20/2016 88 >=60 mL/min Final   GFR calc Af Amer  Date Value Ref Range Status  11/16/2019 >60 >60 mL/min Final   GFR, Est Non African American  Date Value Ref Range Status  02/20/2016 76 >=60 mL/min Final   GFR, Estimated  Date Value Ref Range Status  03/12/2020 59 (L) >60 mL/min Final    Comment:    (NOTE) Calculated using the CKD-EPI Creatinine Equation (2021)    GFR  Date Value Ref Range Status  02/24/2018 77.96 >60.00 mL/min Final   eGFR  Date Value Ref Range Status  06/14/2020 72 >59 mL/min/1.73 Final         Failed - B12 Level in normal range and within 720 days    Vitamin B-12  Date Value Ref Range Status  05/18/2010 297  211 - 911 pg/mL Final         Failed - Valid encounter within last 6 months    Recent Outpatient Visits           7 months ago Encounter for Medicare annual wellness exam   [place], [place], MD    8 months ago Type 2 diabetes mellitus with other circulatory complication, without long-term current use of insulin ([place])   [place], Charlane Ferretti, MD   1 year ago Type 2 diabetes mellitus with other circulatory complication, without long-term current use of insulin ([place])   [place], Charlane Ferretti, MD   1 year ago Seasonal allergies   [place], Charlane Ferretti, MD   1 year ago Idiopathic chronic gout of left foot without tophus   [place], Charlane Ferretti, MD               Failed - CBC within normal limits and completed in the last 12 months    WBC  Date Value Ref Range Status  03/12/2020 11.4 (H) 4.0 - 10.5 K/uL Final   RBC  Date Value Ref Range Status  03/12/2020 5.17 (H) 3.87 - 5.11 MIL/uL Final   Hemoglobin  Date Value Ref Range Status  03/12/2020 12.1 12.0 - 15.0 g/dL Final  09/10/2016 11.5 11.1 - 15.9 g/dL Final   HCT  Date Value Ref Range Status  03/12/2020 40.8 36.0 - 46.0 % Final   Hematocrit  Date Value Ref Range Status  09/10/2016 36.4 34.0 - 46.6 % Final   MCHC  Date Value Ref Range Status  03/12/2020 29.7 (L) 30.0 - 36.0 g/dL Final   [facility]  Date Value Ref Range Status  03/12/2020 23.4 (L) 26.0 - 34.0 pg Final   MCV  Date Value Ref Range Status  03/12/2020 78.9 (L) 80.0 - 100.0 fL Final  09/10/2016 77 (L) 79 - 97 fL Final   No results found for: PLTCOUNTKUC, LABPLAT, POCPLA RDW  Date Value Ref Range Status  03/12/2020 14.8 11.5 - 15.5 % Final  09/10/2016 15.5 (H) 12.3 - 15.4 % Final          metoprolol tartrate (LOPRESSOR) 100 MG tablet 60 tablet 6    Sig: TAKE 1 TABLET (100 MG TOTAL) BY MOUTH 2 (TWO) TIMES DAILY.     Cardiovascular:  Beta Blockers Failed - 08/16/2021  8:23 AM      Failed - Last BP in normal range    BP Readings from Last 1 Encounters:  05/08/21 (!) 156/89         Failed - Valid encounter  within last 6 months    Recent Outpatient Visits           7 months ago Encounter for [employer] annual wellness exam   [place], [place], MD   8 months ago Type 2 diabetes mellitus with other circulatory complication, without long-term current use of insulin ([place])   [place], Leonardo, MD   1 year ago Type 2 diabetes mellitus with other circulatory complication, without long-term current use of insulin ([place])   [place], Enobong, MD   1 year ago Seasonal allergies   [place], Charlane Ferretti, MD   1 year ago Idiopathic chronic gout of left foot without tophus  [place], [place], MD               Passed - Last Heart Rate in normal range    Pulse Readings from Last 1 Encounters:  05/08/21 78          meclizine (ANTIVERT) 25 MG tablet 60 tablet 2    Sig: Take 1 tablet (25 mg total) by mouth 2 (two) times daily as needed for dizziness.     Not Delegated - Gastroenterology: Antiemetics Failed - 08/16/2021  8:23 AM      Failed - This refill cannot be delegated      Failed - Valid encounter within last 6 months    Recent Outpatient Visits           7 months ago Encounter for Medicare annual wellness exam   [place], [place], MD   8 months ago Type 2 diabetes mellitus with other circulatory complication, without long-term current use of insulin ([place])   [place], Charlane Ferretti, MD   1 year ago Type 2 diabetes mellitus with other circulatory complication, without long-term current use of insulin ([place])   [place], Enobong, MD   1 year ago Seasonal allergies   [place], Charlane Ferretti, MD   1 year ago Idiopathic chronic gout of left foot without tophus    [facility] Charlott Rakes, MD

## 2021-08-17 NOTE — Telephone Encounter (Signed)
Patient called, no answer, mailbox is full, SMS notification to return call to [phone]. Patient will need a follow up scheduled, last OV 12/18/20, noted to return in 3 months.

## 2021-08-22 ENCOUNTER — Other Ambulatory Visit (HOSPITAL_COMMUNITY): Payer: Self-pay

## 2021-08-22 MED ORDER — LISINOPRIL 20 MG PO TABS
20.0000 mg | ORAL_TABLET | Freq: Every day | ORAL | 3 refills | Status: DC
  Filled 2021-08-22 – 2021-09-04 (×2): qty 90, 90d supply, fill #0

## 2021-08-22 MED ORDER — ATORVASTATIN CALCIUM 20 MG PO TABS
20.0000 mg | ORAL_TABLET | Freq: Every day | ORAL | 3 refills | Status: DC
  Filled 2021-08-22 – 2021-09-04 (×2): qty 90, 90d supply, fill #0

## 2021-08-22 MED ORDER — GLIPIZIDE 5 MG PO TABS
2.5000 mg | ORAL_TABLET | Freq: Two times a day (BID) | ORAL | 3 refills | Status: AC
  Filled 2021-08-22 – 2021-09-04 (×2): qty 90, 90d supply, fill #0

## 2021-08-22 MED ORDER — FARXIGA 5 MG PO TABS
5.0000 mg | ORAL_TABLET | ORAL | 3 refills | Status: DC
  Filled 2021-08-22 – 2021-09-04 (×2): qty 90, 90d supply, fill #0

## 2021-08-30 ENCOUNTER — Other Ambulatory Visit (HOSPITAL_COMMUNITY): Payer: Self-pay

## 2021-09-03 ENCOUNTER — Other Ambulatory Visit (HOSPITAL_COMMUNITY): Payer: Self-pay

## 2021-09-04 ENCOUNTER — Other Ambulatory Visit (HOSPITAL_COMMUNITY): Payer: Self-pay

## 2021-09-05 ENCOUNTER — Other Ambulatory Visit (HOSPITAL_COMMUNITY): Payer: Self-pay

## 2021-09-05 MED ORDER — METOPROLOL TARTRATE 100 MG PO TABS
100.0000 mg | ORAL_TABLET | Freq: Two times a day (BID) | ORAL | 3 refills | Status: DC
  Filled 2021-09-05: qty 180, 90d supply, fill #0

## 2021-09-05 MED ORDER — DICLOFENAC SODIUM 75 MG PO TBEC
75.0000 mg | DELAYED_RELEASE_TABLET | Freq: Two times a day (BID) | ORAL | 5 refills | Status: DC
  Filled 2021-09-05: qty 60, 30d supply, fill #0

## 2021-09-07 ENCOUNTER — Other Ambulatory Visit: Payer: Self-pay

## 2021-09-12 ENCOUNTER — Other Ambulatory Visit (HOSPITAL_COMMUNITY): Payer: Self-pay

## 2021-09-12 MED ORDER — HYDRALAZINE HCL 50 MG PO TABS
50.0000 mg | ORAL_TABLET | Freq: Every day | ORAL | 3 refills | Status: DC
  Filled 2021-09-12: qty 90, 90d supply, fill #0

## 2021-09-17 ENCOUNTER — Other Ambulatory Visit (HOSPITAL_COMMUNITY): Payer: Self-pay

## 2021-09-18 ENCOUNTER — Ambulatory Visit: Payer: Medicare Other | Admitting: Family Medicine

## 2021-09-19 ENCOUNTER — Telehealth: Payer: Self-pay | Admitting: Pharmacist

## 2021-09-19 NOTE — Telephone Encounter (Signed)
Patient attempted to be outreached by Tanja Port, PharmD Candidate on 09/19/21 to discuss hypertension.   Left voicemail for patient to return our call at their convenience.    Catie Hedwig Morton, PharmD, [facility] [phone]

## 2021-09-20 ENCOUNTER — Other Ambulatory Visit (HOSPITAL_COMMUNITY): Payer: Self-pay

## 2021-10-01 ENCOUNTER — Other Ambulatory Visit (HOSPITAL_COMMUNITY): Payer: Self-pay

## 2021-10-02 ENCOUNTER — Other Ambulatory Visit: Payer: Self-pay | Admitting: Family Medicine

## 2021-10-02 DIAGNOSIS — E1159 Type 2 diabetes mellitus with other circulatory complications: Secondary | ICD-10-CM

## 2021-10-04 ENCOUNTER — Emergency Department (HOSPITAL_COMMUNITY): Payer: Medicare Other

## 2021-10-04 ENCOUNTER — Encounter (HOSPITAL_COMMUNITY): Payer: Self-pay

## 2021-10-04 ENCOUNTER — Inpatient Hospital Stay (HOSPITAL_COMMUNITY)
Admission: EM | Admit: 2021-10-04 | Discharge: 2021-10-08 | DRG: 291 | Disposition: A | Discharge status: Home / Self Care | Payer: Medicare Other | Attending: Internal Medicine | Admitting: Internal Medicine

## 2021-10-04 ENCOUNTER — Other Ambulatory Visit: Payer: Self-pay

## 2021-10-04 DIAGNOSIS — R0609 Other forms of dyspnea: Secondary | ICD-10-CM | POA: Diagnosis not present

## 2021-10-04 DIAGNOSIS — Z8249 Family history of ischemic heart disease and other diseases of the circulatory system: Secondary | ICD-10-CM

## 2021-10-04 DIAGNOSIS — Z8616 Personal history of COVID-19: Secondary | ICD-10-CM

## 2021-10-04 DIAGNOSIS — Z833 Family history of diabetes mellitus: Secondary | ICD-10-CM

## 2021-10-04 DIAGNOSIS — Z7984 Long term (current) use of oral hypoglycemic drugs: Secondary | ICD-10-CM | POA: Diagnosis not present

## 2021-10-04 DIAGNOSIS — I110 Hypertensive heart disease with heart failure: Principal | ICD-10-CM | POA: Diagnosis present

## 2021-10-04 DIAGNOSIS — I5032 Chronic diastolic (congestive) heart failure: Secondary | ICD-10-CM | POA: Diagnosis present

## 2021-10-04 DIAGNOSIS — E1169 Type 2 diabetes mellitus with other specified complication: Secondary | ICD-10-CM | POA: Diagnosis present

## 2021-10-04 DIAGNOSIS — Z823 Family history of stroke: Secondary | ICD-10-CM

## 2021-10-04 DIAGNOSIS — Z6841 Body Mass Index (BMI) 40.0 and over, adult: Secondary | ICD-10-CM

## 2021-10-04 DIAGNOSIS — I10 Essential (primary) hypertension: Secondary | ICD-10-CM

## 2021-10-04 DIAGNOSIS — E119 Type 2 diabetes mellitus without complications: Secondary | ICD-10-CM | POA: Diagnosis not present

## 2021-10-04 DIAGNOSIS — E785 Hyperlipidemia, unspecified: Secondary | ICD-10-CM | POA: Diagnosis present

## 2021-10-04 DIAGNOSIS — Z8673 Personal history of transient ischemic attack (TIA), and cerebral infarction without residual deficits: Secondary | ICD-10-CM

## 2021-10-04 DIAGNOSIS — I161 Hypertensive emergency: Secondary | ICD-10-CM | POA: Diagnosis present

## 2021-10-04 DIAGNOSIS — I5033 Acute on chronic diastolic (congestive) heart failure: Secondary | ICD-10-CM | POA: Diagnosis present

## 2021-10-04 DIAGNOSIS — G40909 Epilepsy, unspecified, not intractable, without status epilepticus: Secondary | ICD-10-CM | POA: Diagnosis present

## 2021-10-04 DIAGNOSIS — D509 Iron deficiency anemia, unspecified: Secondary | ICD-10-CM | POA: Diagnosis present

## 2021-10-04 DIAGNOSIS — R0600 Dyspnea, unspecified: Principal | ICD-10-CM

## 2021-10-04 DIAGNOSIS — R778 Other specified abnormalities of plasma proteins: Secondary | ICD-10-CM

## 2021-10-04 DIAGNOSIS — Z79899 Other long term (current) drug therapy: Secondary | ICD-10-CM

## 2021-10-04 LAB — D-DIMER, QUANTITATIVE: D-Dimer, Quant: 0.35 ug/mL-FEU (ref 0.00–0.50)

## 2021-10-04 LAB — RAPID URINE DRUG SCREEN, HOSP PERFORMED
Amphetamines: NOT DETECTED
Barbiturates: NOT DETECTED
Benzodiazepines: NOT DETECTED
Cocaine: NOT DETECTED
Opiates: NOT DETECTED
Tetrahydrocannabinol: NOT DETECTED

## 2021-10-04 LAB — CBC WITH DIFFERENTIAL/PLATELET
Abs Immature Granulocytes: 0.04 10*3/uL (ref 0.00–0.07)
Basophils Absolute: 0 10*3/uL (ref 0.0–0.1)
Basophils Relative: 0 %
Eosinophils Absolute: 0.2 10*3/uL (ref 0.0–0.5)
Eosinophils Relative: 2 %
HCT: 36.3 % (ref 36.0–46.0)
Hemoglobin: 10.4 g/dL — ABNORMAL LOW (ref 12.0–15.0)
Immature Granulocytes: 1 %
Lymphocytes Relative: 38 %
Lymphs Abs: 3.2 10*3/uL (ref 0.7–4.0)
MCH: 21.3 pg — ABNORMAL LOW (ref 26.0–34.0)
MCHC: 28.7 g/dL — ABNORMAL LOW (ref 30.0–36.0)
MCV: 74.2 fL — ABNORMAL LOW (ref 80.0–100.0)
Monocytes Absolute: 0.7 10*3/uL (ref 0.1–1.0)
Monocytes Relative: 8 %
Neutro Abs: 4.3 10*3/uL (ref 1.7–7.7)
Neutrophils Relative %: 51 %
Platelets: 321 10*3/uL (ref 150–400)
RBC: 4.89 MIL/uL (ref 3.87–5.11)
RDW: 18.6 % — ABNORMAL HIGH (ref 11.5–15.5)
WBC: 8.5 10*3/uL (ref 4.0–10.5)
nRBC: 0 % (ref 0.0–0.2)

## 2021-10-04 LAB — COMPREHENSIVE METABOLIC PANEL
ALT: 20 U/L (ref 0–44)
AST: 22 U/L (ref 15–41)
Albumin: 3.1 g/dL — ABNORMAL LOW (ref 3.5–5.0)
Alkaline Phosphatase: 66 U/L (ref 38–126)
Anion gap: 9 (ref 5–15)
BUN: 16 mg/dL (ref 6–20)
CO2: 30 mmol/L (ref 22–32)
Calcium: 9 mg/dL (ref 8.9–10.3)
Chloride: 104 mmol/L (ref 98–111)
Creatinine, Ser: 1.07 mg/dL — ABNORMAL HIGH (ref 0.44–1.00)
GFR, Estimated: 59 mL/min — ABNORMAL LOW (ref >60)
Glucose, Bld: 90 mg/dL (ref 70–99)
Potassium: 3.8 mmol/L (ref 3.5–5.1)
Sodium: 143 mmol/L (ref 135–145)
Total Bilirubin: 0.4 mg/dL (ref 0.3–1.2)
Total Protein: 7.4 g/dL (ref 6.5–8.1)

## 2021-10-04 LAB — URINALYSIS, COMPLETE (UACMP) WITH MICROSCOPIC
Bilirubin Urine: NEGATIVE
Glucose, UA: 150 mg/dL — AB
Hgb urine dipstick: NEGATIVE
Ketones, ur: NEGATIVE mg/dL
Leukocytes,Ua: NEGATIVE
Nitrite: NEGATIVE
Protein, ur: NEGATIVE mg/dL
Specific Gravity, Urine: 1.011 (ref 1.005–1.030)
pH: 6 (ref 5.0–8.0)

## 2021-10-04 LAB — BRAIN NATRIURETIC PEPTIDE: B Natriuretic Peptide: 111.9 pg/mL — ABNORMAL HIGH (ref 0.0–100.0)

## 2021-10-04 LAB — I-STAT VENOUS BLOOD GAS, ED
Acid-Base Excess: 6 mmol/L — ABNORMAL HIGH (ref 0.0–2.0)
Bicarbonate: 32.1 mmol/L — ABNORMAL HIGH (ref 20.0–28.0)
Calcium, Ion: 1.2 mmol/L (ref 1.15–1.40)
HCT: 36 % (ref 36.0–46.0)
Hemoglobin: 12.2 g/dL (ref 12.0–15.0)
O2 Saturation: 42 %
Potassium: 3.8 mmol/L (ref 3.5–5.1)
Sodium: 145 mmol/L (ref 135–145)
TCO2: 34 mmol/L — ABNORMAL HIGH (ref 22–32)
pCO2, Ven: 55.1 mmHg (ref 44–60)
pH, Ven: 7.374 (ref 7.25–7.43)
pO2, Ven: 25 mmHg — CL (ref 32–45)

## 2021-10-04 LAB — TROPONIN I (HIGH SENSITIVITY)
Troponin I (High Sensitivity): 25 ng/L — ABNORMAL HIGH (ref <18)
Troponin I (High Sensitivity): 25 ng/L — ABNORMAL HIGH (ref <18)

## 2021-10-04 LAB — MAGNESIUM: Magnesium: 1.9 mg/dL (ref 1.7–2.4)

## 2021-10-04 MED ORDER — FUROSEMIDE 10 MG/ML IJ SOLN
40.0000 mg | Freq: Once | INTRAMUSCULAR | Status: AC
  Administered 2021-10-04: 40 mg via INTRAVENOUS
  Filled 2021-10-04: qty 4

## 2021-10-04 MED ORDER — HYDRALAZINE HCL 50 MG PO TABS
50.0000 mg | ORAL_TABLET | Freq: Every day | ORAL | Status: DC
  Administered 2021-10-04 – 2021-10-06 (×3): 50 mg via ORAL
  Filled 2021-10-04: qty 1
  Filled 2021-10-04: qty 2
  Filled 2021-10-04: qty 1

## 2021-10-04 MED ORDER — INSULIN ASPART 100 UNIT/ML IJ SOLN
0.0000 [IU] | Freq: Three times a day (TID) | INTRAMUSCULAR | Status: DC
  Administered 2021-10-05 (×2): 2 [IU] via SUBCUTANEOUS
  Administered 2021-10-06 – 2021-10-07 (×3): 1 [IU] via SUBCUTANEOUS
  Administered 2021-10-08 (×3): 2 [IU] via SUBCUTANEOUS

## 2021-10-04 MED ORDER — MELATONIN 3 MG PO TABS
3.0000 mg | ORAL_TABLET | Freq: Every evening | ORAL | Status: DC | PRN
  Administered 2021-10-05 – 2021-10-07 (×3): 3 mg via ORAL
  Filled 2021-10-04 (×4): qty 1

## 2021-10-04 MED ORDER — ACETAMINOPHEN 325 MG PO TABS: 650.0000 mg | ORAL_TABLET | Freq: Four times a day (QID) | ORAL | Status: DC | PRN

## 2021-10-04 MED ORDER — LISINOPRIL 20 MG PO TABS
20.0000 mg | ORAL_TABLET | Freq: Every day | ORAL | Status: DC
  Administered 2021-10-05: 20 mg via ORAL
  Filled 2021-10-04: qty 1

## 2021-10-04 MED ORDER — METOPROLOL TARTRATE 50 MG PO TABS
50.0000 mg | ORAL_TABLET | Freq: Two times a day (BID) | ORAL | Status: DC
  Administered 2021-10-04 – 2021-10-08 (×8): 50 mg via ORAL
  Filled 2021-10-04 (×4): qty 1
  Filled 2021-10-04: qty 2
  Filled 2021-10-04 (×3): qty 1

## 2021-10-04 MED ORDER — NITROGLYCERIN 0.4 MG SL SUBL
0.4000 mg | SUBLINGUAL_TABLET | SUBLINGUAL | Status: DC | PRN
  Administered 2021-10-04 (×2): 0.4 mg via SUBLINGUAL
  Filled 2021-10-04 (×2): qty 1

## 2021-10-04 MED ORDER — FUROSEMIDE 10 MG/ML IJ SOLN
40.0000 mg | Freq: Two times a day (BID) | INTRAMUSCULAR | Status: DC
  Administered 2021-10-05 – 2021-10-07 (×5): 40 mg via INTRAVENOUS
  Filled 2021-10-04 (×5): qty 4

## 2021-10-04 MED ORDER — ATORVASTATIN CALCIUM 10 MG PO TABS
20.0000 mg | ORAL_TABLET | Freq: Every day | ORAL | Status: DC
  Administered 2021-10-05 – 2021-10-08 (×4): 20 mg via ORAL
  Filled 2021-10-04 (×5): qty 2

## 2021-10-04 MED ORDER — ACETAMINOPHEN 650 MG RE SUPP: 650.0000 mg | Freq: Four times a day (QID) | RECTAL | Status: DC | PRN

## 2021-10-04 MED ORDER — POTASSIUM CHLORIDE CRYS ER 20 MEQ PO TBCR
40.0000 meq | EXTENDED_RELEASE_TABLET | Freq: Once | ORAL | Status: AC
  Administered 2021-10-04: 40 meq via ORAL
  Filled 2021-10-04: qty 2

## 2021-10-04 NOTE — H&P (Signed)
History and Physical    PLEASE NOTE THAT DRAGON DICTATION SOFTWARE WAS USED IN THE CONSTRUCTION OF THIS NOTE.   [name] EGB:[id number] DOB: April 02, 1960 DOA: 10/04/2021  PCP: Charlott Rakes, MD  Patient coming from: home   I have personally briefly reviewed patient's old medical records in [place]  Chief Complaint: Shortness of breath  HPI: [name] is a 61 y.o. female with medical history significant for chronic diastolic heart failure, essential hypertension, chronic iron deficiency anemia associated with baseline hemoglobin range of 10-12, type 2 diabetes mellitus, who is admitted to [facility] on 10/04/2021 with acute on chronic diastolic heart failure after presenting from home to [facility] ED complaining of shortness of breath.   The patient reports 2 weeks of progressive shortness of breath associate with new onset orthopnea.  Over that timeframe, she is also noted some worsening of edema in the bilateral lower extremities prompting her to initiate compression stockings for the bilateral lower extremities.  With this measure as well as with elevation of her legs, she is noted some interval improvement in the nature/extent of her edema in the bilateral lower extremities.  Denies any associated new onset calf tenderness or new onset lower extremity erythema.  Not associate any recent chest pain, diaphoresis, palpitations, nausea, vomiting, presyncope, or syncope.  She is noted associated new onset nonproductive cough in the absence of wheezing or hemoptysis.  No recent trauma or travel.  Denies any associated subjective fever, chills, rigors, or generalized myalgias.  Confirms that she is a lifelong non-smoker, denies any known underlying chronic pulmonary pathology.  She has a history of chronic diastolic heart failure, with most recent echocardiogram, which was performed in July 2017, notable for normal left ventricular cavity size, moderate concentric left  ventricular hypertrophy, LVEF 60 to 65%, no focal wall motion abnormalities, grade 2 diastolic dysfunction, trivial mitral regurgitation/tricuspid regurgitation, and normal right ventricular systolic function.  She is not on any diuretic medications as an outpatient.  Confirms a history of essential hypertension, noting baseline systolic blood pressures in the 170s.  Per chart review, baseline creatinine is 0.9-1.1.  She also has a history of chronic iron deficiency anemia associated with baseline hemoglobin range 10-12, with most recent prior hemoglobin noted to be 12.1 in December 2021.    ED Course:  Vital signs in the ED were notable for the following afebrile; heart rate 71-84; BP 189/109 initially, with ensuing improvement to 163/91 following initiation of IV Lasix as well as a dose of sublingual nitroglycerin which was given for antihypertensive purposes as opposed to chest pain; respiratory rate 1424, oxygen saturation 95 to 100% on room air.  Labs were notable for the following: VBG showed 7.37/55.  CMP notable for the following: Potassium 3.8, creatinine 1.07 compared to most recent prior value of 0.92 in March 2022, liver enzymes within normal limits.  CBC notable for white cell count 8500, hemoglobin 10.4.  BMP 111.  Most recent prior value 29 in August 2021; high intensity troponin I initially 25, with repeat value also 25, without any prior high-sensitivity troponin I data points available for point comparison.  D-dimer 0.35.  Imaging and additional notable ED work-up: Chest x-ray showed severe cardiomegaly with moderate central pulmonary vascular congestion without evidence of infiltrate, pleural effusion, or pneumothorax.  While in the ED, the following were administered: Lasix 40 mg IV x1, sublingual nitroglycerin for antihypertensive purposes x1 dose.  Cardiology  Subsequently, the patient was admitted for further evaluation and  management of acute on chronic diastolic heart  failure.     Review of Systems: As per HPI otherwise 10 point review of systems negative.   Past Medical History:  Diagnosis Date   Headache    Hypertension    Iron deficiency anemia    Obesity    Seizures ([place])    Stroke ([place])    Type 2 diabetes mellitus ([place])     Past Surgical History:  Procedure Laterality Date   CARDIAC CATHETERIZATION  04/2010   Dr. Terrence Dupont   COLONOSCOPY WITH PROPOFOL N/A 04/06/2019   Procedure: COLONOSCOPY WITH PROPOFOL;  Surgeon: Mauri Pole, MD;  Location: [facility] ENDOSCOPY;  Service: Endoscopy;  Laterality: N/A;   POLYPECTOMY  04/06/2019   Procedure: POLYPECTOMY;  Surgeon: Mauri Pole, MD;  Location: [facility] ENDOSCOPY;  Service: Endoscopy;;    Social History:  reports that she has never smoked. She has never used smokeless tobacco. She reports that she does not drink alcohol and does not use drugs.   Allergies  Allergen Reactions   Bee Venom Swelling    Facial swelling   Eggs Or Egg-Derived Products Nausea And Vomiting and Swelling    Mouth swelled   Gabapentin Other (See Comments)    Jittery feeling, dyspnea, crawling sensation   Gadolinium Derivatives Itching    Immediately after gad injection pt. complained of tongue numbness and lower lip/ then had rt side of body tingling/ pt. Was assessed by Dr. Jeralyn Ruths before leaving/ no medications were administered/    Family History  Problem Relation Age of Onset   Diabetes Father    Hypertension Father    Hypertension Sister    Stroke Brother     Family history reviewed and not pertinent    Prior to Admission medications   Medication Sig Start Date End Date Taking? Authorizing Provider  Accu-Chek FastClix Lancets MISC Check blood sugar up to 3 times daily. E11.59 02/22/19   Charlott Rakes, MD  acetaminophen (TYLENOL) 500 MG tablet Take 500-1,000 mg by mouth every 6 (six) hours as needed for mild pain or headache.     [provider]  atorvastatin (LIPITOR) 20 MG tablet TAKE 1  TABLET (20 MG TOTAL) BY MOUTH DAILY. 08/17/21   Charlott Rakes, MD  atorvastatin (LIPITOR) 20 MG tablet Take 1 tablet (20 mg total) by mouth daily. 08/22/21     Blood Glucose Monitoring Suppl (ACCU-CHEK GUIDE ME) w/Device KIT 1 kit by Does not apply route daily. Check blood sugar up to 3 times daily. E11.59 02/22/19   Charlott Rakes, MD  cetirizine (ZYRTEC) 10 MG tablet Take 10 mg by mouth daily as needed for allergies.     [provider]  Colchicine (MITIGARE) 0.6 MG CAPS Take 1 capsule by mouth 2 (two) times daily as needed. 03/30/20   Hilts, Legrand Como, MD  colchicine 0.6 MG tablet Take by mouth 2 (two) times daily as needed. 03/28/20   [provider]  dapagliflozin propanediol (FARXIGA) 5 MG TABS tablet Take 1 tablet (5 mg total) by mouth daily before breakfast. 08/17/21   Charlott Rakes, MD  dapagliflozin propanediol (FARXIGA) 5 MG TABS tablet Take 1 tablet (5 mg total) by mouth every morning. 08/22/21     diclofenac (VOLTAREN) 75 MG EC tablet TAKE 1 TABLET (75 MG TOTAL) BY MOUTH 2 (TWO) TIMES DAILY. 08/17/21   Charlott Rakes, MD  diclofenac (VOLTAREN) 75 MG EC tablet Take 1 tablet (75 mg total) by mouth 2 (two) times daily. 09/05/21  diclofenac Sodium (VOLTAREN) 1 % GEL Apply 1 application topically 4 (four) times daily as needed (pain.). OTC    [provider]  diphenhydrAMINE (BENADRYL) 25 MG tablet Take 25 mg by mouth at bedtime.    [provider]  ergocalciferol (VITAMIN D2) 1.25 MG (50000 UT) capsule Take 1 capsule (50,000 Units total) by mouth once a week on Sundays for 12 weeks. 08/06/21     fluticasone (FLONASE) 50 MCG/ACT nasal spray PLACE 1 SPRAY INTO BOTH NOSTRILS DAILY AS NEEDED FOR ALLERGIES OR RHINITIS. 08/17/21   Charlott Rakes, MD  glipiZIDE (GLUCOTROL) 5 MG tablet TAKE 1/2 TABLETS BY MOUTH 2 TIMES DAILY BEFORE A MEAL. 01/26/21 01/26/22  Charlott Rakes, MD  glipiZIDE (GLUCOTROL) 5 MG tablet Take 0.5 tablets (2.5 mg total) by mouth 2 (two) times  daily before a meal. 08/22/21     glucose blood (ACCU-CHEK GUIDE) test strip Check blood sugar up to 3 times daily. E11.59 02/22/19   Charlott Rakes, MD  hydrALAZINE (APRESOLINE) 50 MG tablet Take 1 tablet (50 mg total) by mouth daily for hypertension 09/12/21     hydroxypropyl methylcellulose / hypromellose (ISOPTO TEARS / GONIOVISC) 2.5 % ophthalmic solution Place 2 drops into both eyes as needed (irritation). Patient taking differently: Place 2 drops into both eyes 4 (four) times daily as needed (irritation). 01/25/19   Charlott Rakes, MD  lidocaine (LIDODERM) 5 % Place 1 patch onto the skin daily. Remove & Discard patch within 12 hours or as directed by MD 03/01/18   Maczis, Barth Kirks, PA-C  lisinopril (ZESTRIL) 20 MG tablet Take 1 tablet (20 mg total) by mouth daily. 08/17/21   Charlott Rakes, MD  lisinopril (ZESTRIL) 20 MG tablet Take 1 tablet (20 mg total) by mouth daily. 08/22/21     metFORMIN (GLUCOPHAGE-XR) 500 MG 24 hr tablet TAKE 2 TABLETS (1,000 MG TOTAL) BY MOUTH 2 (TWO) TIMES DAILY. 08/17/21 08/17/22  Charlott Rakes, MD  metoprolol tartrate (LOPRESSOR) 100 MG tablet TAKE 1 TABLET (100 MG TOTAL) BY MOUTH 2 (TWO) TIMES DAILY. 08/17/21 08/17/22  Charlott Rakes, MD  metoprolol tartrate (LOPRESSOR) 100 MG tablet Take 1 tablet (100 mg total) by mouth 2 (two) times daily. 09/05/21     triamcinolone ointment (KENALOG) 0.1 % Apply a thin layer topically to affected area 2 (two) times daily. 07/30/21        Objective    Physical Exam: Vitals:   10/04/21 2045 10/04/21 2100 10/04/21 2110 10/04/21 2115  BP: (!) 171/100 (!) 188/104  (!) 173/103  Pulse: 75 76  83  Resp: 17 (!) 21  19  Temp:   98.5 F (36.9 C)   TempSrc:   Oral   SpO2: 96% 99%  93%  Weight:      Height:        General: appears to be stated age; alert, oriented; mildly increased work of breathing noted. Skin: warm, dry, no rash Head:  AT/[place] Mouth:  Oral mucosa membranes appear moist, normal dentition Neck: supple; trachea  midline Heart:  RRR; did not appreciate any M/R/G Lungs: Bibasilar crackles noted, otherwise CTAB, did not appreciate any wheezes, or rhonchi Abdomen: + BS; soft, ND, NT Vascular: 2+ pedal pulses b/l; 2+ radial pulses b/l Extremities: Trace edema in the bilateral lower extremities, no muscle wasting Neuro: strength and sensation intact in upper and lower extremities b/l    Labs on Admission: I have personally reviewed following labs and imaging studies  CBC: Recent Labs  Lab 10/04/21 1807 10/04/21 1831  WBC  --  8.5  NEUTROABS  --  4.3  HGB 12.2 10.4*  HCT 36.0 36.3  MCV  --  74.2*  PLT  --  333   Basic Metabolic Panel: Recent Labs  Lab 10/04/21 1807 10/04/21 1831 10/04/21 2013  NA 145 143  --   K 3.8 3.8  --   CL  --  104  --   CO2  --  30  --   GLUCOSE  --  90  --   BUN  --  16  --   CREATININE  --  1.07*  --   CALCIUM  --  9.0  --   MG  --   --  1.9   GFR: Estimated Creatinine Clearance: 69.4 mL/min (A) (by C-G formula based on SCr of 1.07 mg/dL (H)). Liver Function Tests: Recent Labs  Lab 10/04/21 1831  AST 22  ALT 20  ALKPHOS 66  BILITOT 0.4  PROT 7.4  ALBUMIN 3.1*   No results for input(s): "LIPASE", "AMYLASE" in the last 168 hours. No results for input(s): "AMMONIA" in the last 168 hours. Coagulation Profile: No results for input(s): "INR", "PROTIME" in the last 168 hours. Cardiac Enzymes: No results for input(s): "CKTOTAL", "CKMB", "CKMBINDEX", "TROPONINI" in the last 168 hours. BNP (last 3 results) No results for input(s): "PROBNP" in the last 8760 hours. HbA1C: No results for input(s): "HGBA1C" in the last 72 hours. CBG: No results for input(s): "GLUCAP" in the last 168 hours. Lipid Profile: No results for input(s): "CHOL", "HDL", "LDLCALC", "TRIG", "CHOLHDL", "LDLDIRECT" in the last 72 hours. Thyroid Function Tests: No results for input(s): "TSH", "T4TOTAL", "FREET4", "T3FREE", "THYROIDAB" in the last 72 hours. Anemia Panel: No results  for input(s): "VITAMINB12", "FOLATE", "FERRITIN", "TIBC", "IRON", "RETICCTPCT" in the last 72 hours. Urine analysis:    Component Value Date/Time   COLORURINE YELLOW 11/14/2019 [place] 11/14/2019 1219   LABSPEC 1.028 11/14/2019 1219   PHURINE 5.0 11/14/2019 1219   GLUCOSEU NEGATIVE 11/14/2019 1219   GLUCOSEU NEGATIVE 02/24/2018 1457   HGBUR NEGATIVE 11/14/2019 1219   [place] 11/14/2019 1219   KETONESUR NEGATIVE 11/14/2019 1219   PROTEINUR 30 (A) 11/14/2019 1219   UROBILINOGEN 0.2 02/24/2018 1457   NITRITE NEGATIVE 11/14/2019 1219   LEUKOCYTESUR NEGATIVE 11/14/2019 1219    Radiological Exams on Admission: DG Chest Portable 1 View  Result Date: 10/04/2021 CLINICAL DATA:  dyspnea EXAM: PORTABLE CHEST 1 VIEW COMPARISON:  November 09, 2019 FINDINGS: There is severe cardiomegaly and moderate central pulmonary vascular congestion. Cardiomegaly appears to have progressed in the interim. No consolidation or obvious pleural effusion seen. The visualized skeletal structures are unremarkable. IMPRESSION: Severe cardiomegaly and pulmonary vascular congestion and have progressed in the interim. Electronically Signed   By: Frazier Richards M.D.   On: 10/04/2021 17:09      Assessment/Plan   Principal Problem:   Acute on chronic diastolic heart failure (HCC) Active Problems:   Benign essential HTN   Chronic iron deficiency anemia   DM2 (diabetes mellitus, type 2) (HCC)   Morbid obesity (HCC)   Elevated troponin      #) Acute on chronic diastolic heart failure: dx of acute decompensation on the basis of presenting to his aggressive shortness of breath associated with orthopnea and worsening of edema in the bilateral lower extremities, with elevated BNP and chest x-ray showing evidence of interval development of moderate pulmonary vascular congestion. This is in the context of a known history of chronic diastolic  heart failure, with most recent echocardiogram performed  in July 2017, which is notable for LVEF 60 to 65% as well as grade 2 diastolic dysfunction, with additional results as conveyed above. Etiology leading to presenting acutely decompensated heart failure is not entirely clear at this time, although she is at risk for development of systolic dysfunction in the setting of her previously noted moderate concentric left ventricular hypertrophy, with additional risk factors for acutely decompensated heart failure in the setting of her poorly controlled hypertension. of note, not on any diuretic medications at home. Would also likely benefit from optimization of bp control for enhanced afterload reduction.  Other potential contributing factors leading to increased risk for fluid retention include her scheduled use of NSAIDs in the form of scheduled oral diclofenac at home. Additional risk factors predisposing patient to acutely discovered stage heart failure include her morbid obesity, with presenting BMI noted to be 62.  I suspect that mildly elevated initial troponin is a consequence of underlying acutely decompensated heart failure as opposed to representing ACS causing presenting acute heart failure exacerbation, particularly in the absence of any recent CP. Will check EKG now.   patient received Lasix 40 IV x 1 while in the ED today. Presentation warrants additional IV diuresis, as further detailed below, with close monitoring of ensuing renal function, electrolytes, and volume status.  As patient is already on a BB at home, will plan to continue this, but will intiially reduce dose of this med.    Plan: monitor strict I's & O's and daily weights. Monitor on telemetry, including trend in HR in response to diuresis, as above. Monitor continuous pulse oximetry. Repeat BMP in the morning, including for monitoring trend of potassium, bicarbonate, and renal function in response to interval diuresis efforts. Add-on serum magnesium level, and repeat this level in the  AM. Kcl 40 meq po x 1 dose now. Close monitoring of ensuing blood pressure response to diuresis efforts, including to help guide need for improvement in afterload reduction in order to optimize cardiac output. Resumed home lisinopril , hydralazine. Resume home bb, but at half outpatient dose, initially. Trend troponin. Lasix 40 iv bid . Echo ordered for the AM. Ekg. add on serum ethanol level as well as urinary drug screen.  Hold outpatient oral diclofenac for now. Tsh.            #) Essential Hypertension: documented h/o such, with outpatient antihypertensive regimen including lisinopril, hydralazine, and Lopressor 100 mg p.o. twice daily.  Currently on this regimen, with which she reports good compliance, she notes that her baseline systolic blood pressures run in the 170s mmHg.  Initial SBP's in the ED today: In the 180s, before subsequently improving following initiation of IV diuresis efforts as well as cyanosis; nitroglycerin for hypertensive process, as further detailed above.  Will strive to optimize prn hypertensive regimen for afterload reduction purposes, particularly context presenting acute on chronic diastolic heart failure, as above.  Plan: Close monitoring of subsequent BP via routine VS. resume home lisinopril and hydralazine.  Resume home metoprolol tartrate, but at half outpatient dose.  Monitor on telemetry.  Repeat CMP in the morning.  IV diuresis via IV Lasix, as above.        #) Chronic iron-deficiency anemia: Documented history of such, associated baseline hemoglobin range of 10-12, with presenting hemoglobin noted to be consistent with his baseline range, in the absence of any evidence of overt active bleed.  Has anemia can contribute to acute decompensated heart failure,  will assess for optimizability of his iron stores, including assessing to see if patient would benefit from IV iron infusion.  Of note, she is not currently on oral iron supplementation at home.  Not on  a blood thinners as outpatient.  Plan: Add on ferritin level, TIBC, total iron, INR.  Repeat CBC in the morning.           #) Type 2 Diabetes Mellitus: documented history of such. Home insulin regimen: None. Home oral hypoglycemic agents: Metformin, dapagliflozin, glipizide. presenting blood sugar: 90.  Most recent hemoglobin A1c was 77.5% in September 2022.   Plan: accuchecks QAC and HS with low dose SSI.  Add on hemoglobin A1c. hold home oral hypoglycemic agents during this hospitalization.          #) Morbid obesity: Meets criteria for such in the setting of presenting BMI of 62.  Serves as risk factor for her presenting acutely decompensated heart failure.   Plan: Monitor strict I's and O's and daily weights.  Add on TSH, A1c.        DVT prophylaxis: SCD's   Code Status: Full code Family Communication: none Disposition Plan: Per Rounding Team Consults called: none;  Admission status: Inpatient    PLEASE NOTE THAT DRAGON DICTATION SOFTWARE WAS USED IN THE CONSTRUCTION OF THIS NOTE.   [place] DO [facility]  From [place]   10/04/2021, 10:03 PM

## 2021-10-04 NOTE — ED Provider Notes (Signed)
[facility] EMERGENCY DEPARTMENT Provider Note  CSN: [id number] Arrival date & time: 10/04/21 1614  Chief Complaint(s) Shortness of Breath  HPI [name] is a 61 y.o. female with PMH seizure disorder, previous CVA, T2DM, CHF who presents emergency department for evaluation of shortness of breath.  Patient states that over the last 5 days her shortness of breath been progressively worsening and she went to urgent care who found the patient to be in significant respiratory distress and sent her to the emergency department for further evaluation.  She currently denies chest pain, abdominal pain, nausea, vomiting or other systemic symptoms.  Patient arrives in respiratory stress with difficulty finishing sentences.   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   BRBPR (bright red blood per rectum)    Polyp of cecum    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[place]) 12/08/2018   Empty sella ([place]) 02/24/2018   Morbid obesity due to excess calories ([place]) 02/05/2018   Abnormality of gait following cerebrovascular accident (CVA) 10/31/2017   History of stroke 07/16/2017   Headache 04/02/2017   Seizure disorder ([place]) 10/02/2016   Obstructive sleep apnea 11/29/2015   Right shoulder pain 11/29/2015   Adjustment disorder with depressed mood 10/19/2015   Nocturnal enuresis    Hypokalemia    Anemia, iron  deficiency    Super obese    Controlled type 2 diabetes mellitus without complication (HCC)    Orthostatic hypotension    Supplemental oxygen dependent    Diastolic dysfunction    Tachypnea    AKI (acute kidney injury) ([place])    Acute blood loss anemia    Lethargy    Wheezing    Benign essential HTN    ICH (intracerebral hemorrhage) ([place]) 10/06/2015   Home Medication(s) Prior to Admission medications   Medication Sig Start Date End Date Taking? Authorizing Provider  Accu-Chek FastClix Lancets MISC Check blood sugar up to 3 times daily. E11.59 02/22/19   Charlott Rakes, MD  acetaminophen (TYLENOL) 500 MG tablet Take 500-1,000 mg by mouth every 6 (six) hours as needed for mild pain or headache.     [provider]  atorvastatin (LIPITOR) 20 MG tablet TAKE 1 TABLET (20 MG TOTAL) BY MOUTH DAILY. 08/17/21   Charlott Rakes, MD  atorvastatin (LIPITOR) 20 MG tablet Take 1 tablet (20 mg total) by mouth daily. 08/22/21     Blood Glucose Monitoring Suppl (ACCU-CHEK GUIDE ME) w/Device KIT 1 kit by Does not apply route daily. Check blood sugar up to 3 times daily. E11.59 02/22/19   Charlott Rakes, MD  cetirizine (ZYRTEC) 10 MG tablet Take 10 mg by mouth daily as needed for allergies.     [provider]  Colchicine (MITIGARE) 0.6 MG CAPS Take 1 capsule by mouth 2 (two) times daily as needed. 03/30/20   Hilts, Legrand Como, MD  colchicine 0.6 MG tablet Take by mouth  2 (two) times daily as needed. 03/28/20   [provider]  dapagliflozin propanediol (FARXIGA) 5 MG TABS tablet Take 1 tablet (5 mg total) by mouth daily before breakfast. 08/17/21   Charlott Rakes, MD  dapagliflozin propanediol (FARXIGA) 5 MG TABS tablet Take 1 tablet (5 mg total) by mouth every morning. 08/22/21     diclofenac (VOLTAREN) 75 MG EC tablet TAKE 1 TABLET (75 MG TOTAL) BY MOUTH 2 (TWO) TIMES DAILY. 08/17/21   Charlott Rakes, MD  diclofenac (VOLTAREN) 75 MG EC tablet Take 1 tablet (75 mg total) by mouth 2 (two)  times daily. 09/05/21     diclofenac Sodium (VOLTAREN) 1 % GEL Apply 1 application topically 4 (four) times daily as needed (pain.). OTC    [provider]  diphenhydrAMINE (BENADRYL) 25 MG tablet Take 25 mg by mouth at bedtime.    [provider]  ergocalciferol (VITAMIN D2) 1.25 MG (50000 UT) capsule Take 1 capsule (50,000 Units total) by mouth once a week on Sundays for 12 weeks. 08/06/21     fluticasone (FLONASE) 50 MCG/ACT nasal spray PLACE 1 SPRAY INTO BOTH NOSTRILS DAILY AS NEEDED FOR ALLERGIES OR RHINITIS. 08/17/21   Charlott Rakes, MD  glipiZIDE (GLUCOTROL) 5 MG tablet TAKE 1/2 TABLETS BY MOUTH 2 TIMES DAILY BEFORE A MEAL. 01/26/21 01/26/22  Charlott Rakes, MD  glipiZIDE (GLUCOTROL) 5 MG tablet Take 0.5 tablets (2.5 mg total) by mouth 2 (two) times daily before a meal. 08/22/21     glucose blood (ACCU-CHEK GUIDE) test strip Check blood sugar up to 3 times daily. E11.59 02/22/19   Charlott Rakes, MD  hydrALAZINE (APRESOLINE) 50 MG tablet Take 1 tablet (50 mg total) by mouth daily for hypertension 09/12/21     hydroxypropyl methylcellulose / hypromellose (ISOPTO TEARS / GONIOVISC) 2.5 % ophthalmic solution Place 2 drops into both eyes as needed (irritation). Patient taking differently: Place 2 drops into both eyes 4 (four) times daily as needed (irritation). 01/25/19   Charlott Rakes, MD  lidocaine (LIDODERM) 5 % Place 1 patch onto the skin daily. Remove & Discard patch within 12 hours or as directed by MD 03/01/18   Maczis, Barth Kirks, PA-C  lisinopril (ZESTRIL) 20 MG tablet Take 1 tablet (20 mg total) by mouth daily. 08/17/21   Charlott Rakes, MD  lisinopril (ZESTRIL) 20 MG tablet Take 1 tablet (20 mg total) by mouth daily. 08/22/21     metFORMIN (GLUCOPHAGE-XR) 500 MG 24 hr tablet TAKE 2 TABLETS (1,000 MG TOTAL) BY MOUTH 2 (TWO) TIMES DAILY. 08/17/21 08/17/22  Charlott Rakes, MD  metoprolol tartrate (LOPRESSOR) 100 MG tablet TAKE 1 TABLET (100 MG TOTAL) BY MOUTH 2 (TWO) TIMES  DAILY. 08/17/21 08/17/22  Charlott Rakes, MD  metoprolol tartrate (LOPRESSOR) 100 MG tablet Take 1 tablet (100 mg total) by mouth 2 (two) times daily. 09/05/21     triamcinolone ointment (KENALOG) 0.1 % Apply a thin layer topically to affected area 2 (two) times daily. 07/30/21  Past Surgical History Past Surgical History:  Procedure Laterality Date   CARDIAC CATHETERIZATION  04/2010   Dr. Terrence Dupont   COLONOSCOPY WITH PROPOFOL N/A 04/06/2019   Procedure: COLONOSCOPY WITH PROPOFOL;  Surgeon: Mauri Pole, MD;  Location: [facility];  Service: Endoscopy;  Laterality: N/A;   POLYPECTOMY  04/06/2019   Procedure: POLYPECTOMY;  Surgeon: Mauri Pole, MD;  Location: [facility];  Service: Endoscopy;;   Family History Family History  Problem Relation Age of Onset   Diabetes Father    Hypertension Father    Hypertension Sister    Stroke Brother     Social History Social History   Tobacco Use   Smoking status: Never   Smokeless tobacco: Never  Vaping Use   Vaping Use: Never used  Substance Use Topics   Alcohol use: No   Drug use: No   Allergies Bee venom, Eggs or egg-derived products, Gabapentin, and Gadolinium derivatives  Review of Systems Review of Systems  Respiratory:  Positive for shortness of breath.     Physical Exam Vital Signs  I have reviewed the triage vital signs BP (!) 166/87   Pulse 79   Resp (!) 21   Ht '4\' 10"'  (1.473 m)   Wt 135.2 kg   LMP 02/17/2011 (Exact Date)   SpO2 97%   BMI 62.30 kg/m   Physical Exam Vitals and nursing note reviewed.  Constitutional:      General: She is not in acute distress.    Appearance: She is well-developed.  HENT:     Head: Normocephalic and atraumatic.  Eyes:     Conjunctiva/sclera: Conjunctivae normal.  Cardiovascular:     Rate and Rhythm: Normal rate and regular rhythm.      Heart sounds: No murmur heard. Pulmonary:     Effort: Pulmonary effort is normal. No respiratory distress.     Breath sounds: Decreased breath sounds present.  Abdominal:     Palpations: Abdomen is soft.     Tenderness: There is no abdominal tenderness.  Musculoskeletal:        General: No swelling.     Cervical back: Neck supple.     Right lower leg: Edema present.     Left lower leg: Edema present.  Skin:    General: Skin is warm and dry.     Capillary Refill: Capillary refill takes less than 2 seconds.  Neurological:     Mental Status: She is alert.  Psychiatric:        Mood and Affect: Mood normal.     ED Results and Treatments Labs (all labs ordered are listed, but only abnormal results are displayed) Labs Reviewed  COMPREHENSIVE METABOLIC PANEL - Abnormal; Notable for the following components:      Result Value   Creatinine, Ser 1.07 (*)    Albumin 3.1 (*)    GFR, Estimated 59 (*)    All other components within normal limits  CBC WITH DIFFERENTIAL/PLATELET - Abnormal; Notable for the following components:   Hemoglobin 10.4 (*)    MCV 74.2 (*)    MCH 21.3 (*)    MCHC 28.7 (*)    RDW 18.6 (*)    All other components within normal limits  I-STAT VENOUS BLOOD GAS, ED - Abnormal; Notable for the following components:   pO2, Ven 25 (*)    Bicarbonate 32.1 (*)    TCO2 34 (*)    Acid-Base Excess 6.0 (*)    All other components within normal limits  TROPONIN I (  HIGH SENSITIVITY) - Abnormal; Notable for the following components:   Troponin I (High Sensitivity) 25 (*)    All other components within normal limits  D-DIMER, QUANTITATIVE  BLOOD GAS, VENOUS  BRAIN NATRIURETIC PEPTIDE  TROPONIN I (HIGH SENSITIVITY)                                                                                                                          Radiology DG Chest Portable 1 View  Result Date: 10/04/2021 CLINICAL DATA:  dyspnea EXAM: PORTABLE CHEST 1 VIEW COMPARISON:  November 09, 2019 FINDINGS: There is severe cardiomegaly and moderate central pulmonary vascular congestion. Cardiomegaly appears to have progressed in the interim. No consolidation or obvious pleural effusion seen. The visualized skeletal structures are unremarkable. IMPRESSION: Severe cardiomegaly and pulmonary vascular congestion and have progressed in the interim. Electronically Signed   By: Frazier Richards M.D.   On: 10/04/2021 17:09    Pertinent labs & imaging results that were available during my care of the patient were reviewed by me and considered in my medical decision making (see MDM for details).  Medications Ordered in ED Medications  nitroGLYCERIN (NITROSTAT) SL tablet 0.4 mg (0.4 mg Sublingual Given 10/04/21 1950)  furosemide (LASIX) injection 40 mg (40 mg Intravenous Given 10/04/21 1959)                                                                                                                                     Procedures .Critical Care  Performed by: Teressa Lower, MD Authorized by: Teressa Lower, MD   Critical care provider statement:    Critical care time (minutes):  30   Critical care was necessary to treat or prevent imminent or life-threatening deterioration of the following conditions:  Respiratory failure   Critical care was time spent personally by me on the following activities:  Development of treatment plan with patient or surrogate, discussions with consultants, evaluation of patient's response to treatment, examination of patient, ordering and review of laboratory studies, ordering and review of radiographic studies, ordering and performing treatments and interventions, pulse oximetry, re-evaluation of patient's condition and review of old charts   (including critical care time)  Medical Decision Making / ED Course   This patient presents to the ED for concern of shortness of breath, this involves an extensive number of treatment options, and is a complaint that  carries with it  a high risk of complications and morbidity.  The differential diagnosis includes CHF exacerbation, pneumonia, PE, ACS  MDM: Patient seen emergency room for evaluation of shortness of breath.  Physical exam with bilateral lower extremity edema, decreased breath sounds bilaterally but is otherwise unremarkable.  Laboratory evaluation with a hemoglobin of 10.4, troponin elevated 25 likely demand ischemia, negative D-dimer, BNP elevated to 119.9.  Laboratory evaluation largely was unremarkable.  Patient arrives with blood pressures greater than 180 and chest x-ray is concerning for severe cardiomegaly with pulmonary edema and due to concern for possible flash pulmonary edema she was given 2 doses of sublingual nitroglycerin which improved her blood pressure.  Diuresis begun. due to concern for CHF exacerbation and need for echocardiogram, patient admitted to the hospital service  Additional history obtained: -Additional history obtained from multiple family members -External records from outside source obtained and reviewed including: Chart review including previous notes, labs, imaging, consultation notes   Lab Tests: -I ordered, reviewed, and interpreted labs.   The pertinent results include:   Labs Reviewed  COMPREHENSIVE METABOLIC PANEL - Abnormal; Notable for the following components:      Result Value   Creatinine, Ser 1.07 (*)    Albumin 3.1 (*)    GFR, Estimated 59 (*)    All other components within normal limits  CBC WITH DIFFERENTIAL/PLATELET - Abnormal; Notable for the following components:   Hemoglobin 10.4 (*)    MCV 74.2 (*)    MCH 21.3 (*)    MCHC 28.7 (*)    RDW 18.6 (*)    All other components within normal limits  I-STAT VENOUS BLOOD GAS, ED - Abnormal; Notable for the following components:   pO2, Ven 25 (*)    Bicarbonate 32.1 (*)    TCO2 34 (*)    Acid-Base Excess 6.0 (*)    All other components within normal limits  TROPONIN I (HIGH SENSITIVITY) -  Abnormal; Notable for the following components:   Troponin I (High Sensitivity) 25 (*)    All other components within normal limits  D-DIMER, QUANTITATIVE  BLOOD GAS, VENOUS  BRAIN NATRIURETIC PEPTIDE  TROPONIN I (HIGH SENSITIVITY)    Imaging Studies ordered: I ordered imaging studies including chest x-ray I independently visualized and interpreted imaging. I agree with the radiologist interpretation   Medicines ordered and prescription drug management: Meds ordered this encounter  Medications   furosemide (LASIX) injection 40 mg   nitroGLYCERIN (NITROSTAT) SL tablet 0.4 mg    -I have reviewed the patients home medicines and have made adjustments as needed  Critical interventions Diuresis, nitroglycerin  Cardiac Monitoring: The patient was maintained on a cardiac monitor.  I personally viewed and interpreted the cardiac monitored which showed an underlying rhythm of: NSR  Social Determinants of Health:  Factors impacting patients care include: none   Reevaluation: After the interventions noted above, I reevaluated the patient and found that they have :improved  Co morbidities that complicate the patient evaluation  Past Medical History:  Diagnosis Date   Headache    Hypertension    Iron deficiency anemia    Obesity    Seizures ([place])    Stroke ([place])    Type 2 diabetes mellitus ([place])       Dispostion: I considered admission for this patient, and due to his CHF exacerbation and significant shortness of breath, patient will require hospital admission     Final Clinical Impression(s) / ED Diagnoses Final diagnoses:  None     '@PCDICTATION' @  Teressa Lower, MD 10/04/21 2238

## 2021-10-04 NOTE — ED Triage Notes (Signed)
5 days of SOB went to Urgent care they called EMS and EMS states they do not know why.  In the urgent care note he reads that she was unable to complete sentences from SOB for EMS EMS reports that she is able to complete sentences.    162/70 HR 70 18 RR 97% On 4L not on O2 at home

## 2021-10-05 ENCOUNTER — Inpatient Hospital Stay (HOSPITAL_COMMUNITY): Payer: Medicare Other

## 2021-10-05 DIAGNOSIS — I10 Essential (primary) hypertension: Secondary | ICD-10-CM | POA: Diagnosis not present

## 2021-10-05 DIAGNOSIS — E1169 Type 2 diabetes mellitus with other specified complication: Secondary | ICD-10-CM

## 2021-10-05 DIAGNOSIS — D509 Iron deficiency anemia, unspecified: Secondary | ICD-10-CM | POA: Diagnosis not present

## 2021-10-05 DIAGNOSIS — E785 Hyperlipidemia, unspecified: Secondary | ICD-10-CM

## 2021-10-05 DIAGNOSIS — R0609 Other forms of dyspnea: Secondary | ICD-10-CM | POA: Diagnosis not present

## 2021-10-05 DIAGNOSIS — I5033 Acute on chronic diastolic (congestive) heart failure: Secondary | ICD-10-CM | POA: Diagnosis not present

## 2021-10-05 LAB — GLUCOSE, CAPILLARY
Glucose-Capillary: 183 mg/dL — ABNORMAL HIGH (ref 70–99)
Glucose-Capillary: 119 mg/dL — ABNORMAL HIGH (ref 70–99)
Glucose-Capillary: 176 mg/dL — ABNORMAL HIGH (ref 70–99)
Glucose-Capillary: 185 mg/dL — ABNORMAL HIGH (ref 70–99)
Glucose-Capillary: 161 mg/dL — ABNORMAL HIGH (ref 70–99)

## 2021-10-05 LAB — CBC WITH DIFFERENTIAL/PLATELET
Abs Immature Granulocytes: 0.04 10*3/uL (ref 0.00–0.07)
Basophils Absolute: 0.1 10*3/uL (ref 0.0–0.1)
Basophils Relative: 1 %
Eosinophils Absolute: 0.2 10*3/uL (ref 0.0–0.5)
Eosinophils Relative: 2 %
HCT: 35.6 % — ABNORMAL LOW (ref 36.0–46.0)
Hemoglobin: 10.5 g/dL — ABNORMAL LOW (ref 12.0–15.0)
Immature Granulocytes: 0 %
Lymphocytes Relative: 40 %
Lymphs Abs: 3.8 10*3/uL (ref 0.7–4.0)
MCH: 21.4 pg — ABNORMAL LOW (ref 26.0–34.0)
MCHC: 29.5 g/dL — ABNORMAL LOW (ref 30.0–36.0)
MCV: 72.7 fL — ABNORMAL LOW (ref 80.0–100.0)
Monocytes Absolute: 0.8 10*3/uL (ref 0.1–1.0)
Monocytes Relative: 9 %
Neutro Abs: 4.7 10*3/uL (ref 1.7–7.7)
Neutrophils Relative %: 48 %
Platelets: 302 10*3/uL (ref 150–400)
RBC: 4.9 MIL/uL (ref 3.87–5.11)
RDW: 18.6 % — ABNORMAL HIGH (ref 11.5–15.5)
WBC: 9.6 10*3/uL (ref 4.0–10.5)
nRBC: 0 % (ref 0.0–0.2)

## 2021-10-05 LAB — IRON AND TIBC
Iron: 30 ug/dL (ref 28–170)
Saturation Ratios: 9 % — ABNORMAL LOW (ref 10.4–31.8)
TIBC: 328 ug/dL (ref 250–450)
UIBC: 298 ug/dL

## 2021-10-05 LAB — COMPREHENSIVE METABOLIC PANEL
ALT: 19 U/L (ref 0–44)
AST: 19 U/L (ref 15–41)
Albumin: 3.1 g/dL — ABNORMAL LOW (ref 3.5–5.0)
Alkaline Phosphatase: 63 U/L (ref 38–126)
Anion gap: 10 (ref 5–15)
BUN: 15 mg/dL (ref 6–20)
CO2: 28 mmol/L (ref 22–32)
Calcium: 8.8 mg/dL — ABNORMAL LOW (ref 8.9–10.3)
Chloride: 105 mmol/L (ref 98–111)
Creatinine, Ser: 1.09 mg/dL — ABNORMAL HIGH (ref 0.44–1.00)
GFR, Estimated: 58 mL/min — ABNORMAL LOW (ref >60)
Glucose, Bld: 130 mg/dL — ABNORMAL HIGH (ref 70–99)
Potassium: 3.9 mmol/L (ref 3.5–5.1)
Sodium: 143 mmol/L (ref 135–145)
Total Bilirubin: 0.7 mg/dL (ref 0.3–1.2)
Total Protein: 7.5 g/dL (ref 6.5–8.1)

## 2021-10-05 LAB — TSH: TSH: 1.809 u[IU]/mL (ref 0.350–4.500)

## 2021-10-05 LAB — PROTIME-INR
INR: 1.1 (ref 0.8–1.2)
Prothrombin Time: 13.8 seconds (ref 11.4–15.2)

## 2021-10-05 LAB — ECHOCARDIOGRAM COMPLETE
Area-P 1/2: 4.12 cm2
Height: 59 in
S' Lateral: 2.9 cm
Weight: 4846.59 oz

## 2021-10-05 LAB — TROPONIN I (HIGH SENSITIVITY): Troponin I (High Sensitivity): 27 ng/L — ABNORMAL HIGH (ref <18)

## 2021-10-05 LAB — MAGNESIUM: Magnesium: 1.9 mg/dL (ref 1.7–2.4)

## 2021-10-05 LAB — HEMOGLOBIN A1C
Hgb A1c MFr Bld: 6.7 % — ABNORMAL HIGH (ref 4.8–5.6)
Mean Plasma Glucose: 145.59 mg/dL

## 2021-10-05 LAB — FERRITIN: Ferritin: 22 ng/mL (ref 11–307)

## 2021-10-05 LAB — ETHANOL: Alcohol, Ethyl (B): <10 mg/dL (ref <10)

## 2021-10-05 MED ORDER — DAPAGLIFLOZIN PROPANEDIOL 10 MG PO TABS
10.0000 mg | ORAL_TABLET | Freq: Every day | ORAL | Status: DC
  Administered 2021-10-05 – 2021-10-08 (×4): 10 mg via ORAL
  Filled 2021-10-05 (×4): qty 1

## 2021-10-05 MED ORDER — ORAL CARE MOUTH RINSE
15.0000 mL | OROMUCOSAL | Status: DC | PRN
Start: 2021-10-05 — End: 2021-10-08

## 2021-10-05 MED ORDER — LOSARTAN POTASSIUM 50 MG PO TABS
50.0000 mg | ORAL_TABLET | Freq: Every day | ORAL | Status: DC
  Administered 2021-10-06 – 2021-10-08 (×3): 50 mg via ORAL
  Filled 2021-10-05 (×3): qty 1

## 2021-10-05 NOTE — ED Notes (Signed)
Pt given [place] sandwich bag and water

## 2021-10-05 NOTE — TOC Progression Note (Signed)
Transition of Care [facility]) - Progression Note    Patient Details  Name: [name] MRN: [id number] Date of Birth: 06-Feb-1961  [facility] of [facility]) CM/SW Contact  Zenon Mayo, RN Phone Number: 10/05/2021, 4:41 PM  Clinical Narrative:    from home with sife, HTN crisis, CHF, conts on IV lasix, room air, oxygen prn, uses walker. TOC following.         Expected Discharge Plan and Services                                                 Social Determinants of Health (SDOH) Interventions    Readmission Risk Interventions     No data to display

## 2021-10-05 NOTE — Assessment & Plan Note (Signed)
Continue with iron supplementation.

## 2021-10-05 NOTE — Progress Notes (Signed)
  Progress Note   Patient: [name] WUG:[id number] DOB: 1960/09/19 DOA: 10/04/2021     1 DOS: the patient was seen and examined on 10/05/2021   Brief hospital course: Mrs. Lutter was admitted to the hospital with the working diagnosis of decompensated heart failure.   61 yo female with the past medical history of diastolic heart failure, hypertension, T2DM, iron deficiency anemia and obesity class 3 who presented with dyspnea. Reported 2 weeks of progressive dyspnea, with orthopnea and lower extremity edema. On her initial physical examination her blood pressure was 189/109, HR 84, RR 21 and 02 saturation 93%, lungs with rales bilaterally, heart with S1 and S2 present with no gallops, abdomen not distended and positive lower extremity edema.   Na 143, K 3.8 CL 104, bicarbonate 30 glucose 90 bun 16 cr 1,07  High sensitive troponin 25 and 25  Wbc 8,5 hgb 10.4 plt 321   Urine analysis with SG 1,011   Chest radiograph with cardiomegaly, with bilateral hilar vascular congestion.  EKG 83 bpm, normal axis, normal intervals, sinus rhythm with no significant ST segment, negative T wave lead I, II and AvL.   Assessment and Plan: * Acute on chronic diastolic heart failure ([place]) Last echocardiogram from 2017 with preserved LV systolic function.   Documented urine output 1000 ml  Continue to have dyspnea but improved from admission, not back to her baseline.   Blood pressure 140 to 130 mmHg.   Continue blood pressure control with metoprolol. Will transition from lisinopril to ARB with possible change to entresto at the time of discharge.  Add SGLT2 inh.     Essential hypertension Hypertensive emergency.   Blood pressure is improving with diuresis.  Continue blood pressure control with metoprolol, hydralazine and will change from lisinopril to losartan.   Type 2 diabetes mellitus with hyperlipidemia (HCC) Continue glucose cover and monitoring with insulin sliding scale.  Patient is  tolerating po well Consult nutrition for T2DM and heart failure teaching.   Continue with statin therapy.   Chronic iron deficiency anemia Continue with iron supplementation.   Class 3 obesity (HCC) Calculated BMI is 61,18  Consult nutrition for teaching.         Subjective: Patient with improvement in dyspnea and edema but not back to baseline, no chest pain   Physical Exam: Vitals:   10/05/21 0009 10/05/21 0420 10/05/21 0755 10/05/21 1119  BP: (!) 157/69 (!) 150/68 (!) 149/77 130/63  Pulse: 79 69 74 72  Resp: '17 20 20 20  '$ Temp: 98.3 F (36.8 C) 98.1 F (36.7 C) 98.4 F (36.9 C) 97.9 F (36.6 C)  TempSrc: Oral Oral Oral Oral  SpO2: 100% 96% 100% 92%  Weight: (!) 137.4 kg     Height: '4\' 11"'$  (1.499 m)      Neurology awake and alert ENT with no pallor Cardiovascular with S1 and S2 present and rhythmic with no gallops No JVD wide neck Respiratory with mild expiratory wheezing and bilateral rales Abdomen protuberant but not tender or distended Positive lower extremity edema ++  Data Reviewed:    Family Communication: I spoke with patient's mother and sister  at the bedside, we talked in detail about patient's condition, plan of care and prognosis and all questions were addressed.   Disposition: Status is: Inpatient Remains inpatient appropriate because: heart failure   Planned Discharge Destination: Home    Author: Tawni Millers, MD 10/05/2021 2:31 PM  For on call review [email or web address].

## 2021-10-05 NOTE — Assessment & Plan Note (Addendum)
Last echocardiogram from 2017 with preserved LV systolic function.   Documented urine output 1000 ml  Continue to have dyspnea but improved from admission, not back to her baseline.   Blood pressure 140 to 130 mmHg.   Continue blood pressure control with metoprolol. Will transition from lisinopril to ARB with possible change to entresto at the time of discharge.  Add SGLT2 inh.

## 2021-10-05 NOTE — Assessment & Plan Note (Addendum)
Her glucose remained stable during her hospitalization, she did received insulin sliding scale for glucose cover and monitoring.  At the time of her discharge she will continue with glipizide.   Continue with statin therapy.

## 2021-10-05 NOTE — Hospital Course (Signed)
Mrs. Croswell was admitted to the hospital with the working diagnosis of decompensated heart failure.   61 yo female with the past medical history of diastolic heart failure, hypertension, T2DM, iron deficiency anemia and obesity class 3 who presented with dyspnea. Reported 2 weeks of progressive dyspnea, with orthopnea and lower extremity edema. On her initial physical examination her blood pressure was 189/109, HR 84, RR 21 and 02 saturation 93%, lungs with rales bilaterally, heart with S1 and S2 present with no gallops, abdomen not distended and positive lower extremity edema.   Na 143, K 3.8 CL 104, bicarbonate 30 glucose 90 bun 16 cr 1,07  High sensitive troponin 25 and 25  Wbc 8,5 hgb 10.4 plt 321   Urine analysis with SG 1,011   Chest radiograph with cardiomegaly, with bilateral hilar vascular congestion.  EKG 83 bpm, normal axis, normal intervals, sinus rhythm with no significant ST segment, negative T wave lead I, II and AvL.   Patient was placed on furosemide for diuresis  Echocardiogram with preserved LV systolic function.

## 2021-10-05 NOTE — Assessment & Plan Note (Addendum)
Calculated BMI is 61,18  Consult nutrition for teaching.

## 2021-10-05 NOTE — Plan of Care (Signed)
  Problem: Pain Managment: Goal: General experience of comfort will improve Outcome: Completed/Met   Problem: Safety: Goal: Ability to remain free from injury will improve Outcome: Completed/Met   Problem: Skin Integrity: Goal: Risk for impaired skin integrity will decrease Outcome: Completed/Met   Problem: Skin Integrity: Goal: Risk for impaired skin integrity will decrease Outcome: Completed/Met

## 2021-10-05 NOTE — Assessment & Plan Note (Addendum)
Hypertensive emergency.   Improved blood pressure with losartan and diuresis. Possible transition to entresto.   Continue with hydralazine, change from daily to bid dosing for now.

## 2021-10-06 DIAGNOSIS — E1169 Type 2 diabetes mellitus with other specified complication: Secondary | ICD-10-CM | POA: Diagnosis not present

## 2021-10-06 DIAGNOSIS — I5033 Acute on chronic diastolic (congestive) heart failure: Secondary | ICD-10-CM | POA: Diagnosis not present

## 2021-10-06 DIAGNOSIS — I10 Essential (primary) hypertension: Secondary | ICD-10-CM | POA: Diagnosis not present

## 2021-10-06 DIAGNOSIS — D509 Iron deficiency anemia, unspecified: Secondary | ICD-10-CM | POA: Diagnosis not present

## 2021-10-06 LAB — GLUCOSE, CAPILLARY
Glucose-Capillary: 143 mg/dL — ABNORMAL HIGH (ref 70–99)
Glucose-Capillary: 128 mg/dL — ABNORMAL HIGH (ref 70–99)
Glucose-Capillary: 103 mg/dL — ABNORMAL HIGH (ref 70–99)
Glucose-Capillary: 142 mg/dL — ABNORMAL HIGH (ref 70–99)

## 2021-10-06 LAB — BASIC METABOLIC PANEL
Anion gap: 10 (ref 5–15)
BUN: 15 mg/dL (ref 6–20)
CO2: 30 mmol/L (ref 22–32)
Calcium: 8.9 mg/dL (ref 8.9–10.3)
Chloride: 98 mmol/L (ref 98–111)
Creatinine, Ser: 1.07 mg/dL — ABNORMAL HIGH (ref 0.44–1.00)
GFR, Estimated: 59 mL/min — ABNORMAL LOW (ref >60)
Glucose, Bld: 107 mg/dL — ABNORMAL HIGH (ref 70–99)
Potassium: 3.7 mmol/L (ref 3.5–5.1)
Sodium: 138 mmol/L (ref 135–145)

## 2021-10-06 MED ORDER — HYDRALAZINE HCL 50 MG PO TABS
50.0000 mg | ORAL_TABLET | Freq: Two times a day (BID) | ORAL | Status: DC
  Administered 2021-10-06 – 2021-10-08 (×4): 50 mg via ORAL
  Filled 2021-10-06 (×4): qty 1

## 2021-10-06 NOTE — Progress Notes (Signed)
  Progress Note   Patient: [name] [id number]:[id number] DOB: Oct 23, 1960 DOA: 10/04/2021     2 DOS: the patient was seen and examined on 10/06/2021   Brief hospital course: Mrs. Glade was admitted to the hospital with the working diagnosis of decompensated heart failure.   61 yo female with the past medical history of diastolic heart failure, hypertension, T2DM, iron deficiency anemia and obesity class 3 who presented with dyspnea. Reported 2 weeks of progressive dyspnea, with orthopnea and lower extremity edema. On her initial physical examination her blood pressure was 189/109, HR 84, RR 21 and 02 saturation 93%, lungs with rales bilaterally, heart with S1 and S2 present with no gallops, abdomen not distended and positive lower extremity edema.   Na 143, K 3.8 CL 104, bicarbonate 30 glucose 90 bun 16 cr 1,07  High sensitive troponin 25 and 25  Wbc 8,5 hgb 10.4 plt 321   Urine analysis with SG 1,011   Chest radiograph with cardiomegaly, with bilateral hilar vascular congestion.  EKG 83 bpm, normal axis, normal intervals, sinus rhythm with no significant ST segment, negative T wave lead I, II and AvL.   Patient was placed on furosemide for diuresis  Echocardiogram with preserved LV systolic function.   Assessment and Plan: * Acute on chronic diastolic heart failure (HCC) Echocardiogram with preserved LV systolic function EF 60 to 65%, mild LVH, RV systolic function preserved. No significant valvular disease.   Documented urine output 0,762 ml  Systolic blood pressure 263 to 128 mmHg.   Continue with metoprolol and losartan for blood pressure control, possible transition to entresto at the time of her discharge.  Continue with dapagliflozin.    Essential hypertension Hypertensive emergency.   Improved blood pressure with losartan and diuresis. Possible transition to entresto.   Continue with hydralazine, change from daily to bid dosing for now.   Type 2 diabetes mellitus with  hyperlipidemia (HCC) Continue glucose cover and monitoring with insulin sliding scale.  Patient is tolerating po well Consult nutrition for T2DM and heart failure teaching.   Continue with statin therapy.   Chronic iron deficiency anemia Continue with iron supplementation.   Class 3 obesity (HCC) Calculated BMI is 61,18  Consult nutrition for teaching.         Subjective: Patient feeling better dyspnea and edema have improved but not back to baseline/  Physical Exam: Vitals:   10/05/21 2351 10/06/21 0516 10/06/21 0810 10/06/21 1044  BP: (!) 157/77 (!) 159/87 (!) 163/91 128/73  Pulse: 71  94 72  Resp: '20 19 20 20  '$ Temp: 98.1 F (36.7 C)  97.9 F (36.6 C) 98.3 F (36.8 C)  TempSrc: Oral  Oral Oral  SpO2: 99% 97% 94% 100%  Weight:  (!) 137.4 kg    Height:       Neurology awake and alert ENT with no pallor Cardiovascular with S1 and S2 present and rhythmic with no gallops or murmurs Respiratory with no rales or wheezing Abdomen not distended Positive pitting bilateral lower extremity edema ++  Data Reviewed:    Family Communication: no family at the bedside   Disposition: Status is: Inpatient Remains inpatient appropriate because: heart failure with IV furosemide   Planned Discharge Destination: Home      Author: Tawni Millers, MD 10/06/2021 11:41 AM  For on call review [email or web address].

## 2021-10-07 DIAGNOSIS — E1169 Type 2 diabetes mellitus with other specified complication: Secondary | ICD-10-CM | POA: Diagnosis not present

## 2021-10-07 DIAGNOSIS — D509 Iron deficiency anemia, unspecified: Secondary | ICD-10-CM | POA: Diagnosis not present

## 2021-10-07 DIAGNOSIS — I5033 Acute on chronic diastolic (congestive) heart failure: Secondary | ICD-10-CM | POA: Diagnosis not present

## 2021-10-07 DIAGNOSIS — I10 Essential (primary) hypertension: Secondary | ICD-10-CM | POA: Diagnosis not present

## 2021-10-07 LAB — BASIC METABOLIC PANEL
Anion gap: 10 (ref 5–15)
BUN: 19 mg/dL (ref 6–20)
CO2: 27 mmol/L (ref 22–32)
Calcium: 9 mg/dL (ref 8.9–10.3)
Chloride: 99 mmol/L (ref 98–111)
Creatinine, Ser: 1.08 mg/dL — ABNORMAL HIGH (ref 0.44–1.00)
GFR, Estimated: 59 mL/min — ABNORMAL LOW (ref >60)
Glucose, Bld: 128 mg/dL — ABNORMAL HIGH (ref 70–99)
Potassium: 3.4 mmol/L — ABNORMAL LOW (ref 3.5–5.1)
Sodium: 136 mmol/L (ref 135–145)

## 2021-10-07 LAB — GLUCOSE, CAPILLARY
Glucose-Capillary: 134 mg/dL — ABNORMAL HIGH (ref 70–99)
Glucose-Capillary: 111 mg/dL — ABNORMAL HIGH (ref 70–99)
Glucose-Capillary: 91 mg/dL (ref 70–99)
Glucose-Capillary: 155 mg/dL — ABNORMAL HIGH (ref 70–99)

## 2021-10-07 MED ORDER — FUROSEMIDE 20 MG PO TABS
20.0000 mg | ORAL_TABLET | Freq: Every day | ORAL | Status: DC
  Administered 2021-10-08: 20 mg via ORAL
  Filled 2021-10-07: qty 1

## 2021-10-07 MED ORDER — ENOXAPARIN SODIUM 40 MG/0.4ML IJ SOSY
40.0000 mg | PREFILLED_SYRINGE | INTRAMUSCULAR | Status: DC
Start: 2021-10-07 — End: 2021-10-08
  Administered 2021-10-07: 40 mg via SUBCUTANEOUS
  Filled 2021-10-07: qty 0.4

## 2021-10-07 MED ORDER — POTASSIUM CHLORIDE CRYS ER 20 MEQ PO TBCR
40.0000 meq | EXTENDED_RELEASE_TABLET | ORAL | Status: AC
  Administered 2021-10-07 (×2): 40 meq via ORAL
  Filled 2021-10-07 (×2): qty 2

## 2021-10-07 NOTE — Progress Notes (Signed)
Progress Note   Patient: [name] [id number]:[id number] DOB: 1960-12-18 DOA: 10/04/2021     3 DOS: the patient was seen and examined on 10/07/2021   Brief hospital course: Mrs. Taussig was admitted to the hospital with the working diagnosis of decompensated heart failure.   61 yo female with the past medical history of diastolic heart failure, hypertension, T2DM, iron deficiency anemia and obesity class 3 who presented with dyspnea. Reported 2 weeks of progressive dyspnea, with orthopnea and lower extremity edema. On her initial physical examination her blood pressure was 189/109, HR 84, RR 21 and 02 saturation 93%, lungs with rales bilaterally, heart with S1 and S2 present with no gallops, abdomen not distended and positive lower extremity edema.   Na 143, K 3.8 CL 104, bicarbonate 30 glucose 90 bun 16 cr 1,07  High sensitive troponin 25 and 25  Wbc 8,5 hgb 10.4 plt 321   Urine analysis with SG 1,011   Chest radiograph with cardiomegaly, with bilateral hilar vascular congestion.  EKG 83 bpm, normal axis, normal intervals, sinus rhythm with no significant ST segment, negative T wave lead I, II and AvL.   Patient was placed on furosemide for diuresis  Echocardiogram with preserved LV systolic function.   Assessment and Plan: * Acute on chronic diastolic heart failure (HCC) Echocardiogram with preserved LV systolic function EF 60 to 65%, mild LVH, RV systolic function preserved. No significant valvular disease.   Documented urine output 8,366 ml  Systolic blood pressure 294 to 140 mmHg.  Improved volume status. Portable ultrasound was used to evaluate: Other pulmonary edema   With A lines bilaterally, consistent with resolution of pulmonary edema.   Plan to continue with metoprolol and losartan for blood pressure control, possible transition to entresto at the time of her discharge.  Continue with dapagliflozin.   Essential hypertension Hypertensive emergency.   Continue with  metoprolol, losartan and hydralazine  Possible transition to entresto at the time of her discharge (if entresto added, will dc hydralazine).     Type 2 diabetes mellitus with hyperlipidemia (HCC) Continue glucose cover and monitoring with insulin sliding scale.  Patient is tolerating po well Consult nutrition for T2DM and heart failure teaching.   Continue with statin therapy.   Chronic iron deficiency anemia Continue with iron supplementation.   Class 3 obesity (HCC) Calculated BMI is 61,18  Consult nutrition for teaching.         Subjective: Patient is feeling better, dyspnea has improved, no chest pain.   Physical Exam: Vitals:   10/07/21 0900 10/07/21 1000 10/07/21 1100 10/07/21 1154  BP:    133/84  Pulse:    71  Resp: '19 16 18 14  '$ Temp:    98 F (36.7 C)  TempSrc:    Oral  SpO2:    96%  Weight:      Height:       Neurology awake and alert ENT With mild pallor Cardiovascular with S1 and S2 present and rhythmic with no gallops, rubs or murmurs No JVD Respiratory with no rales or wheezing Abdomen not distended Lower extremity with trace non pitting edema.  Data Reviewed:    Family Communication: her grandson was at the bedside and her family was on speaker phone at the time of my examination.   Disposition: Status is: Inpatient Remains inpatient appropriate because: resolving heart failure, possible dc home in am.   Planned Discharge Destination: Home      Author: Tawni Millers, MD 10/07/2021 1:58 PM  For on call review [email or web address].

## 2021-10-07 NOTE — Plan of Care (Signed)
  Problem: Education: Goal: Knowledge of General Education information will improve Description: Including pain rating scale, medication(s)/side effects and non-pharmacologic comfort measures Outcome: Progressing   Problem: Fluid Volume: Goal: Ability to maintain a balanced intake and output will improve Outcome: Progressing   Problem: Activity: Goal: Capacity to carry out activities will improve Outcome: Progressing

## 2021-10-08 ENCOUNTER — Telehealth (HOSPITAL_COMMUNITY): Payer: Self-pay | Admitting: Pharmacy Technician

## 2021-10-08 ENCOUNTER — Other Ambulatory Visit (HOSPITAL_COMMUNITY): Payer: Self-pay

## 2021-10-08 LAB — GLUCOSE, CAPILLARY
Glucose-Capillary: 169 mg/dL — ABNORMAL HIGH (ref 70–99)
Glucose-Capillary: 155 mg/dL — ABNORMAL HIGH (ref 70–99)
Glucose-Capillary: 187 mg/dL — ABNORMAL HIGH (ref 70–99)

## 2021-10-08 LAB — BASIC METABOLIC PANEL
Anion gap: 10 (ref 5–15)
BUN: 21 mg/dL — ABNORMAL HIGH (ref 6–20)
CO2: 30 mmol/L (ref 22–32)
Calcium: 9.2 mg/dL (ref 8.9–10.3)
Chloride: 98 mmol/L (ref 98–111)
Creatinine, Ser: 1.16 mg/dL — ABNORMAL HIGH (ref 0.44–1.00)
GFR, Estimated: 54 mL/min — ABNORMAL LOW (ref >60)
Glucose, Bld: 163 mg/dL — ABNORMAL HIGH (ref 70–99)
Potassium: 4 mmol/L (ref 3.5–5.1)
Sodium: 138 mmol/L (ref 135–145)

## 2021-10-08 MED ORDER — FUROSEMIDE 20 MG PO TABS
20.0000 mg | ORAL_TABLET | Freq: Every day | ORAL | 0 refills | Status: DC
  Filled 2021-10-08: qty 30, 30d supply, fill #0

## 2021-10-08 MED ORDER — DAPAGLIFLOZIN PROPANEDIOL 10 MG PO TABS
10.0000 mg | ORAL_TABLET | Freq: Every day | ORAL | 0 refills | Status: DC
  Filled 2021-10-08: qty 30, 30d supply, fill #0

## 2021-10-08 MED ORDER — METOPROLOL TARTRATE 50 MG PO TABS
50.0000 mg | ORAL_TABLET | Freq: Two times a day (BID) | ORAL | 0 refills | Status: DC
  Filled 2021-10-08: qty 60, 30d supply, fill #0

## 2021-10-08 MED ORDER — SACUBITRIL-VALSARTAN 49-51 MG PO TABS
1.0000 | ORAL_TABLET | Freq: Two times a day (BID) | ORAL | Status: DC
  Administered 2021-10-08: 1 via ORAL
  Filled 2021-10-08: qty 1

## 2021-10-08 MED ORDER — ACETAMINOPHEN 325 MG PO TABS: 650.0000 mg | ORAL_TABLET | Freq: Four times a day (QID) | ORAL | Status: AC | PRN

## 2021-10-08 MED ORDER — SACUBITRIL-VALSARTAN 49-51 MG PO TABS
1.0000 | ORAL_TABLET | Freq: Two times a day (BID) | ORAL | 0 refills | Status: DC
Start: 2021-10-08 — End: 2021-10-19
  Filled 2021-10-08: qty 60, 30d supply, fill #0

## 2021-10-08 NOTE — TOC Benefit Eligibility Note (Signed)
Patient Teacher, English as a foreign language completed.    The patient is currently admitted and upon discharge could be taking Entresto 24-26 mg.  The current 30 day co-pay is, $0.00.   The patient is insured through [employer], [place] Patient Advocate Specialist [place] Patient Advocate Team Direct Number: [phone]  Fax: [phone]

## 2021-10-08 NOTE — Progress Notes (Signed)
Mobility Specialist: Progress Note   10/08/21 1236  Mobility  Activity Ambulated with assistance in hallway  Level of Assistance +2 (takes two people) (Chair follow)  Games developer wheel walker  Distance Ambulated (ft) 200 ft  Activity Response Tolerated well  $Mobility charge 1 Mobility   Pre-Mobility: 70 HR, 134/76 (94) BP, 98% SpO2 Post-Mobility: 83 HR, 97% SpO2  Pt received sitting EOB and agreeable to mobility. MinA to stand. To BR and then agreeable to hallway ambulation. C/o initial dizziness, improved with distance. Stopped x 3 for seated breaks secondary to SOB and general fatigue. Pt to the recliner after session with call bell at her side. Chair alarm is on.   [facility] Mobility Specialist Mobility Specialist [address]: [phone]

## 2021-10-08 NOTE — Care Management Important Message (Signed)
Important Message  Patient Details  Name: CHANLER SCHREITER MRN: [id number] Date of Birth: [birth year]   Medicare Important Message Given:  Yes     Shelda Altes 10/08/2021, 10:24 AM

## 2021-10-08 NOTE — TOC Transition Note (Signed)
Transition of Care [facility]) - CM/SW Discharge Note   Patient Details  Name: [name] MRN: [id number] Date of Birth: 13-Jan-1961  [facility] CM/SW Contact:  Zenon Mayo, RN Phone Number: 10/08/2021, 2:00 PM   Clinical Narrative:    Patient is for dc today, TOC to fill meds. Her spouse will transport her home today. She has no other needs.         Patient Goals and CMS Choice        Discharge Placement                       Discharge Plan and Services                                     Social Determinants of Health (SDOH) Interventions     Readmission Risk Interventions     No data to display

## 2021-10-08 NOTE — Telephone Encounter (Signed)
Pharmacy Patient Advocate Encounter  Insurance verification completed.    The patient is insured through [employer] Medicare Part D   The patient is currently admitted and ran test claims for the following: Entresto  Copays and coinsurance results were relayed to Inpatient clinical team. 

## 2021-10-08 NOTE — Discharge Summary (Signed)
Physician Discharge Summary   Patient: [name] MRN: [id number] DOB: [birth year]  Admit date:     10/04/2021  Discharge date: 10/08/21  Discharge Physician: [name] [name]   PCP: Mary Rakes, MD   Recommendations at discharge:    Patient has been placed on aggressive antihypertensive regimen  Added diuretic therapy with furosemide and SGLT 2 inh Will need close follow up and aggressive life style modification.  Patient with resistant hypertension, will refer to the outpatient hypertension clinic for follow up.  Discontinue hydralazine, lisinopril and decrease dose of metoprolol.  Added Entresto.   Discharge Diagnoses: Principal Problem:   Acute on chronic diastolic heart failure (HCC) Active Problems:   Essential hypertension   Type 2 diabetes mellitus with hyperlipidemia (HCC)   Chronic iron deficiency anemia   Class 3 obesity (HCC)  Resolved Problems:   * No resolved hospital problems. [facility] Course: [name] was admitted to the hospital with the working diagnosis of decompensated heart failure.   61 yo female with the past medical history of diastolic heart failure, hypertension, T2DM, iron deficiency anemia and obesity class 3 who presented with dyspnea. Reported 2 weeks of progressive dyspnea, with orthopnea and lower extremity edema. On her initial physical examination her blood pressure was 189/109, HR 84, RR 21 and 02 saturation 93%, lungs with rales bilaterally, heart with S1 and S2 present with no gallops, abdomen not distended and positive lower extremity edema.   Na 143, K 3.8 CL 104, bicarbonate 30 glucose 90 bun 16 cr 1,07  High sensitive troponin 25 and 25  Wbc 8,5 hgb 10.4 plt 321   Urine analysis with SG 1,011   Chest radiograph with cardiomegaly, with bilateral hilar vascular congestion.  EKG 83 bpm, normal axis, normal intervals, sinus rhythm with no significant ST segment, negative T wave lead I, II and AvL.   Patient was placed  on furosemide for diuresis  Echocardiogram with preserved LV systolic function.   Assessment and Plan: * Acute on chronic diastolic heart failure (HCC) Echocardiogram with preserved LV systolic function EF 60 to 65%, mild LVH, RV systolic function preserved. No significant valvular disease.   Documented urine output 1,761 ml  Systolic blood pressure 607 to 140 mmHg.  Improved volume status. Portable ultrasound was used to evaluate: Other pulmonary edema   With A lines bilaterally, consistent with resolution of pulmonary edema.   Plan to continue with metoprolol and losartan for blood pressure control, possible transition to entresto at the time of her discharge.  Continue with dapagliflozin.   Essential hypertension Hypertensive emergency.   Continue with metoprolol, losartan and hydralazine  Possible transition to entresto at the time of her discharge (if entresto added, will dc hydralazine).     Type 2 diabetes mellitus with hyperlipidemia (HCC) Continue glucose cover and monitoring with insulin sliding scale.  Patient is tolerating po well Consult nutrition for T2DM and heart failure teaching.   Continue with statin therapy.   Chronic iron deficiency anemia Continue with iron supplementation.   Class 3 obesity (HCC) Calculated BMI is 61,18  Consult nutrition for teaching.          Consultants: none  Procedures performed: none   Disposition: Home Diet recommendation:  Cardiac diet DISCHARGE MEDICATION: Allergies as of 10/08/2021       Reactions   Bee Venom Swelling   Facial swelling   Eggs Or Egg-derived Products Nausea And Vomiting, Swelling   Mouth swelled   Gabapentin Other (See Comments)  Jittery feeling, dyspnea, crawling sensation   Gadolinium Derivatives Itching   Immediately after gad injection pt. complained of tongue numbness and lower lip/ then had rt side of body tingling/ pt. Was assessed by Dr. Jeralyn [name] before leaving/ no medications were  administered/        Medication List     STOP taking these medications    Colchicine 0.6 MG Caps Commonly known as: Mitigare   diclofenac 75 MG EC tablet Commonly known as: VOLTAREN   hydrALAZINE 50 MG tablet Commonly known as: APRESOLINE   hydroxypropyl methylcellulose / hypromellose 2.5 % ophthalmic solution Commonly known as: ISOPTO TEARS / GONIOVISC   lidocaine 5 % Commonly known as: Lidoderm   lisinopril 20 MG tablet Commonly known as: ZESTRIL   triamcinolone ointment 0.1 % Commonly known as: KENALOG       TAKE these medications    Accu-Chek FastClix Lancets Misc Check blood sugar up to 3 times daily. E11.59   Accu-Chek Guide Me w/Device Kit 1 kit by Does not apply route daily. Check blood sugar up to 3 times daily. E11.59   Accu-Chek Guide test strip Generic drug: glucose blood Check blood sugar up to 3 times daily. E11.59   acetaminophen 325 MG tablet Commonly known as: TYLENOL Take 2 tablets (650 mg total) by mouth every 6 (six) hours as needed for mild pain (or Fever >/= 101).   atorvastatin 20 MG tablet Commonly known as: LIPITOR Take 1 tablet (20 mg total) by mouth daily. What changed: Another medication with the same name was removed. Continue taking this medication, and follow the directions you see here.   dapagliflozin propanediol 10 MG Tabs tablet Commonly known as: FARXIGA Take 1 tablet (10 mg total) by mouth daily. Start taking on: October 09, 2021 What changed:  medication strength how much to take when to take this Another medication with the same name was removed. Continue taking this medication, and follow the directions you see here.   diphenhydrAMINE 25 MG tablet Commonly known as: BENADRYL Take 25 mg by mouth at bedtime as needed for itching or allergies.   fluticasone 50 MCG/ACT nasal spray Commonly known as: FLONASE PLACE 1 SPRAY INTO BOTH NOSTRILS DAILY AS NEEDED FOR ALLERGIES OR RHINITIS.   furosemide 20 MG  tablet Commonly known as: LASIX Take 1 tablet (20 mg total) by mouth daily. Start taking on: October 09, 2021   glipiZIDE 5 MG tablet Commonly known as: GLUCOTROL Take 0.5 tablets (2.5 mg total) by mouth 2 (two) times daily before a meal. What changed:  how much to take when to take this Another medication with the same name was removed. Continue taking this medication, and follow the directions you see here.   loratadine 10 MG tablet Commonly known as: CLARITIN Take 10 mg by mouth daily as needed for allergies.   meclizine 25 MG tablet Commonly known as: ANTIVERT Take 25 mg by mouth 2 (two) times daily as needed for dizziness.   metFORMIN 500 MG 24 hr tablet Commonly known as: GLUCOPHAGE-XR TAKE 2 TABLETS (1,000 MG TOTAL) BY MOUTH 2 (TWO) TIMES DAILY.   methocarbamol 750 MG tablet Commonly known as: ROBAXIN Take 750 mg by mouth 2 (two) times daily as needed for muscle spasms.   metoprolol tartrate 50 MG tablet Commonly known as: LOPRESSOR Take 1 tablet (50 mg total) by mouth 2 (two) times daily. What changed:  medication strength how much to take Another medication with the same name was removed. Continue taking this medication,  and follow the directions you see here.   sacubitril-valsartan 49-51 MG Commonly known as: ENTRESTO Take 1 tablet by mouth 2 (two) times daily.   Vitamin D (Ergocalciferol) 1.25 MG (50000 UNIT) Caps capsule Commonly known as: DRISDOL Take 1 capsule (50,000 Units total) by mouth once a week on _0  bpm. Exam Location:  Inpatient Procedure: 2D Echo, Color Doppler and Cardiac Doppler Indications:    SOB  History:        Patient has prior history of Echocardiogram examinations, most                 recent 10/09/2015. CHF, Stroke; Risk Factors:Diabetes and                 Hypertension.  Sonographer:    Eartha Inch Referring Phys: [id number] Rhetta Mura  Sonographer Comments: Technically difficult study due to poor echo windows and patient is morbidly obese. IMPRESSIONS  1. Left ventricular ejection fraction, by estimation, is 60 to 65%. The left ventricle has normal function. The left ventricle has no regional wall motion abnormalities. There is mild left ventricular hypertrophy. Left ventricular diastolic parameters are consistent with Grade II diastolic dysfunction (pseudonormalization).  2. Right ventricular systolic function is normal. The right ventricular size is normal.  3. The mitral valve is normal in structure. No evidence of mitral valve regurgitation. No evidence of mitral stenosis.  4. The aortic valve is normal in structure. Aortic valve regurgitation is not visualized. No aortic stenosis is present.  5. The inferior vena cava is normal in size with greater than 50% respiratory variability, suggesting right atrial pressure of 3 mmHg. FINDINGS   Left Ventricle: Left ventricular ejection fraction, by estimation, is 60 to 65%. The left ventricle has normal function. The left ventricle has no regional wall motion abnormalities. The left ventricular internal cavity size was normal in size. There is  mild left ventricular hypertrophy. Left ventricular diastolic parameters are consistent with Grade II diastolic dysfunction (pseudonormalization). Right Ventricle: The  right ventricular size is normal. No increase in right ventricular wall thickness. Right ventricular systolic function is normal. Left Atrium: Left atrial size was normal in size. Right Atrium: Right atrial size was normal in size. Pericardium: There is no evidence of pericardial effusion. Mitral Valve: The mitral valve is normal in structure. No evidence of mitral valve regurgitation. No evidence of mitral valve stenosis. Tricuspid Valve: The tricuspid valve is normal in structure. Tricuspid valve regurgitation is not demonstrated. No evidence of tricuspid stenosis. Aortic Valve: The aortic valve is normal in structure. Aortic valve regurgitation is not visualized. No aortic stenosis is present. Pulmonic Valve: The pulmonic valve was normal in structure. Pulmonic valve regurgitation is not visualized. No evidence of pulmonic stenosis. Aorta: The aortic root is normal in size and structure. Venous: The inferior vena cava is normal in size with greater than 50% respiratory variability, suggesting right atrial pressure of 3 mmHg. IAS/Shunts: No atrial level shunt detected by color flow Doppler.  LEFT VENTRICLE PLAX 2D LVIDd:         4.50 cm   Diastology LVIDs:         2.90 cm   LV e' medial:    4.04 cm/s LV PW:         1.30 cm   LV E/e' medial:  21.3 LV IVS:        1.30 cm   LV e' lateral:   7.14 cm/s LVOT diam:     2.10 cm   LV E/e' lateral: 12.1 LV SV:         75 LV SV Index:   34 LVOT Area:     3.46 cm  RIGHT VENTRICLE            IVC RV S prime:     9.30 cm/s  IVC diam: 1.70 cm TAPSE (M-mode): 1.8  cm LEFT ATRIUM             Index        RIGHT ATRIUM           Index LA diam:        4.90 cm 2.23 cm/m   RA Area:     12.70 cm LA Vol (A2C):   42.2 ml 19.18 ml/m  RA Volume:   25.20 ml  11.45 ml/m LA Vol (A4C):   75.7 ml 34.41 ml/m LA Biplane Vol: 60.9 ml 27.68 ml/m  AORTIC VALVE LVOT Vmax:   101.00 cm/s LVOT Vmean:  79.200 cm/s LVOT VTI:    0.216 m  AORTA Ao Root diam: 2.90 cm MITRAL VALVE MV Area (PHT): 4.12 cm    SHUNTS MV Decel Time: 184 msec    Systemic VTI:  0.22 m MV E velocity: 86.25 cm/s  Systemic Diam: 2.10 cm MV A velocity: 73.10 cm/s MV E/A ratio:  1.18 Candee Furbish MD Electronically signed by Candee Furbish MD Signature Date/Time: 10/05/2021/2:37:33 PM    Final    DG Chest Portable 1 View  Result Date: 10/04/2021 CLINICAL DATA:  dyspnea EXAM: PORTABLE CHEST 1 VIEW COMPARISON:  November 09, 2019 FINDINGS: There is severe cardiomegaly and moderate central pulmonary vascular congestion. Cardiomegaly appears to have progressed in the interim. No consolidation or obvious pleural effusion seen. The visualized skeletal structures are unremarkable. IMPRESSION: Severe cardiomegaly and pulmonary vascular congestion and have progressed in the interim. Electronically Signed   By: Frazier Richards M.D.   On: 10/04/2021 17:09    Microbiology: Results for orders placed or performed during the hospital encounter  of 11/10/19  SARS Coronavirus 2 by RT PCR (hospital order, performed in [facility] hospital lab) Nasopharyngeal Nasopharyngeal Swab     Status: Abnormal   Collection Time: 11/10/19  6:13 AM   Specimen: Nasopharyngeal Swab  Result Value Ref Range Status   SARS Coronavirus 2 POSITIVE (A) NEGATIVE Final    Comment: RESULT CALLED TO, READ BACK BY AND VERIFIED WITH: GARRISON,G. RN _0  ON 8.18.2021 BY NMCCOY (NOTE) SARS-CoV-2 target nucleic acids are DETECTED  SARS-CoV-2 RNA is generally detectable in upper respiratory specimens  during the acute phase of infection.  Positive results are indicative   of the presence of the identified virus, but do not rule out bacterial infection or co-infection with other pathogens not detected by the test.  Clinical correlation with patient history and  other diagnostic information is necessary to determine patient infection status.  The expected result is negative.  Fact Sheet for Patients:   StrictlyIdeas.no   Fact Sheet for Healthcare Providers:   BankingDealers.co.za    This test is not yet approved or cleared by the [place] FDA and  has been authorized for detection and/or diagnosis of SARS-CoV-2 by FDA under an Emergency Use Authorization (EUA).  This EUA will remain in effect (meanin g this test can be used) for the duration of  the COVID-19 declaration under Section 564(b)(1) of the Act, 21 U.S.C. section 360-bbb-3(b)(1), unless the authorization is terminated or revoked sooner.  Performed at [facility], [place] [address], [place], [place] [address]   Blood Culture (routine x 2)     Status: None   Collection Time: 11/10/19 12:51 PM   Specimen: BLOOD  Result Value Ref Range Status   Specimen Description   Final    BLOOD LEFT ANTECUBITAL Performed at [facility] [address], [place], [place] [address]    Special Requests   Final    BOTTLES DRAWN AEROBIC AND ANAEROBIC Blood Culture adequate volume Performed at [facility] [address] [place], [place] [address]    Culture   Final    NO GROWTH 5 DAYS Performed at [facility], [place] [address] [place],  [address]    Report Status 11/15/2019 FINAL  Final    Labs: CBC: Recent Labs  Lab 10/04/21 1807 10/04/21 1831 10/05/21 0333  WBC  --  8.5 9.6  NEUTROABS  --  4.3 4.7  HGB 12.2 10.4* 10.5*  HCT 36.0 36.3 35.6*  MCV  --  74.2* 72.7*  PLT  --  321 706   Basic Metabolic Panel: Recent Labs  Lab 10/04/21 1831 10/04/21 2013 10/05/21 0333  10/06/21 0316 10/07/21 0421 10/08/21 0516  NA 143  --  143 138 136 138  K 3.8  --  3.9 3.7 3.4* 4.0  CL 104  --  105 98 99 98  CO2 30  --  _1 GLUCOSE 90  --  130* 107* 128* 163*  BUN 16  --  _2 21*  CREATININE 1.07*  --  1.09* 1.07* 1.08* 1.16*  CALCIUM 9.0  --  8.8* 8.9 9.0 9.2  MG  --  1.9 1.9  --   --   --    Liver Function Tests: Recent Labs  Lab 10/04/21 1831 10/05/21 0333  AST 22 19  ALT 20 19  ALKPHOS 66 63  BILITOT 0.4 0.7  PROT 7.4 7.5  ALBUMIN 3.1* 3.1*   CBG: Recent Labs  Lab  10/07/21 1152 10/07/21 1647 10/07/21 2142 10/08/21 0617 10/08/21 1131  GLUCAP 111* 91 155* 169* 155*    Discharge time spent: greater than 30 minutes.  Signed: Tawni Millers, MD [facility] 10/08/2021

## 2021-10-09 ENCOUNTER — Telehealth: Payer: Self-pay

## 2021-10-09 ENCOUNTER — Other Ambulatory Visit (HOSPITAL_COMMUNITY): Payer: Self-pay

## 2021-10-09 ENCOUNTER — Inpatient Hospital Stay: Payer: Medicare Other | Admitting: Critical Care Medicine

## 2021-10-09 MED ORDER — AZITHROMYCIN 250 MG PO TABS
ORAL_TABLET | ORAL | 0 refills | Status: DC
  Filled 2021-10-09: qty 6, 5d supply, fill #0

## 2021-10-09 MED ORDER — FERROUS SULFATE 325 (65 FE) MG PO TABS: ORAL_TABLET | ORAL | 3 refills | Status: DC

## 2021-10-09 MED ORDER — OMEPRAZOLE 40 MG PO CPDR
40.0000 mg | DELAYED_RELEASE_CAPSULE | Freq: Every day | ORAL | 3 refills | Status: AC
Start: 2021-10-09 — End: ?
  Filled 2021-10-09: qty 90, 90d supply, fill #0

## 2021-10-09 MED ORDER — FLUTICASONE PROPIONATE 50 MCG/ACT NA SUSP
2.0000 | Freq: Every day | NASAL | 0 refills | Status: AC
  Filled 2021-10-09: qty 16, 30d supply, fill #0

## 2021-10-09 MED ORDER — BENZONATATE 100 MG PO CAPS
100.0000 mg | ORAL_CAPSULE | Freq: Three times a day (TID) | ORAL | 0 refills | Status: DC | PRN
  Filled 2021-10-09: qty 30, 10d supply, fill #0

## 2021-10-09 NOTE — Telephone Encounter (Signed)
Transition Care Management Unsuccessful Follow-up Telephone Call  Date of discharge and from where:  10/08/2021, [facility]  Attempts:  1st Attempt  Reason for unsuccessful TCM follow-up call:  Left voice message on # [phone], call back requested.  The patient has a hospital follow up appointment at [facility] with Dr Joya Gaskins this afternoon.

## 2021-10-09 NOTE — Telephone Encounter (Signed)
Transition Care Management Follow-up Telephone Call  Call received from the patient.  Date of discharge and from where: 10/08/2021, [facility]  How have you been since you were released from the hospital? She said she is feeling " a whole lot better." She then explained that she has an appointment with a  new provider, Festus Aloe,  at [facility] this afternoon at 1600. She stated that she will not be seeing Dr Margarita Rana any longer.  She has an appointment scheduled with Dr Joya Gaskins this afternoon at 1500 at [facility] that I will cancel.

## 2021-10-09 NOTE — Progress Notes (Deleted)
Established Patient Office Visit  Subjective   Patient ID: [name], female    DOB: [birth year]  Age: 61 y.o. MRN: [id number]  No chief complaint on file.   PCP [place]  post HFU  Admit date:     10/04/2021 Discharge date: 10/08/21 Discharge Physician: Jimmy Picket Arrien  PCP: Charlott Rakes, MD   Recommendations at discharge:   1.  Patient has been placed on aggressive antihypertensive regimen  2. Added diuretic therapy with furosemide and SGLT 2 inh 3. Will need close follow up and aggressive life style modification.  4. Patient with resistant hypertension, will refer to the outpatient hypertension clinic for follow up.  5. Discontinue hydralazine, lisinopril and decrease dose of metoprolol.  6. Added Entresto.   Discharge Diagnoses: Principal Problem:   Acute on chronic diastolic heart failure (HCC) Active Problems:   Essential hypertension   Type 2 diabetes mellitus with hyperlipidemia (HCC)   Chronic iron deficiency anemia   Class 3 obesity (HCC)  Resolved Problems:   * No resolved hospital problems. [facility] Course: [name] was admitted to the hospital with the working diagnosis of decompensated heart failure.   61 yo female with the past medical history of diastolic heart failure, hypertension, T2DM, iron deficiency anemia and obesity class 3 who presented with dyspnea. Reported 2 weeks of progressive dyspnea, with orthopnea and lower extremity edema. On her initial physical examination her blood pressure was 189/109, HR 84, RR 21 and 02 saturation 93%, lungs with rales bilaterally, heart with S1 and S2 present with no gallops, abdomen not distended and positive lower extremity edema.   Na 143, K 3.8 CL 104, bicarbonate 30 glucose 90 bun 16 cr 1,07  High sensitive troponin 25 and 25  Wbc 8,5 hgb 10.4 plt 321   Urine analysis with SG 1,011   Chest radiograph with cardiomegaly, with bilateral hilar vascular congestion.  EKG 83 bpm,  normal axis, normal intervals, sinus rhythm with no significant ST segment, negative T wave lead I, II and AvL.   Patient was placed on furosemide for diuresis  Echocardiogram with preserved LV systolic function.   Assessment and Plan: * Acute on chronic diastolic heart failure (HCC) Echocardiogram with preserved LV systolic function EF 60 to 65%, mild LVH, RV systolic function preserved. No significant valvular disease.   Documented urine output 2,637 ml  Systolic blood pressure 858 to 140 mmHg.  Improved volume status. Portable ultrasound was used to evaluate: Other pulmonary edema   With A lines bilaterally, consistent with resolution of pulmonary edema.   Plan to continue with metoprolol and losartan for blood pressure control, possible transition to entresto at the time of her discharge.  Continue with dapagliflozin.   Essential hypertension Hypertensive emergency.   Continue with metoprolol, losartan and hydralazine  Possible transition to entresto at the time of her discharge (if entresto added, will dc hydralazine).     Type 2 diabetes mellitus with hyperlipidemia (HCC) Continue glucose cover and monitoring with insulin sliding scale.  Patient is tolerating po well Consult nutrition for T2DM and heart failure teaching.   Continue with statin therapy.   Chronic iron deficiency anemia Continue with iron supplementation.   Class 3 obesity (HCC) Calculated BMI is 61,18  Consult nutrition for teaching.      {History (Optional):23778}  ROS    Objective:     LMP 02/17/2011 (Exact Date)  {Vitals History (Optional):23777}  Physical Exam   No results found for any visits on 10/09/21.  {  Labs (Optional):23779}  The ASCVD Risk score (Arnett DK, et al., 2019) failed to calculate for the following reasons:   The valid total cholesterol range is 130 to 320 mg/dL    Assessment & Plan:   Problem List Items Addressed This Visit   None   No  follow-ups on file.    Mary Noble, MD

## 2021-10-19 ENCOUNTER — Other Ambulatory Visit (HOSPITAL_COMMUNITY): Payer: Self-pay

## 2021-10-19 MED ORDER — ENTRESTO 49-51 MG PO TABS
ORAL_TABLET | ORAL | 3 refills | Status: DC
  Filled 2021-10-19 – 2021-11-09 (×2): qty 180, 90d supply, fill #0

## 2021-10-19 MED ORDER — FERROUS SULFATE 325 (65 FE) MG PO TABS
325.0000 mg | ORAL_TABLET | Freq: Every day | ORAL | 11 refills | Status: DC
Start: 2021-10-19 — End: 2021-12-13
  Filled 2021-10-19: qty 30, 30d supply, fill #0

## 2021-10-22 ENCOUNTER — Other Ambulatory Visit (HOSPITAL_COMMUNITY): Payer: Self-pay

## 2021-10-25 ENCOUNTER — Other Ambulatory Visit (HOSPITAL_COMMUNITY): Payer: Self-pay

## 2021-10-26 ENCOUNTER — Other Ambulatory Visit (HOSPITAL_COMMUNITY): Payer: Self-pay

## 2021-10-26 MED ORDER — FUROSEMIDE 20 MG PO TABS
20.0000 mg | ORAL_TABLET | Freq: Every day | ORAL | 3 refills | Status: AC
  Filled 2021-10-26: qty 90, 90d supply, fill #0
  Filled 2021-11-01: qty 90, 90d supply, fill #1

## 2021-10-26 MED ORDER — BENZONATATE 100 MG PO CAPS
100.0000 mg | ORAL_CAPSULE | Freq: Three times a day (TID) | ORAL | 0 refills | Status: DC | PRN
  Filled 2021-10-26: qty 30, 10d supply, fill #0

## 2021-10-29 ENCOUNTER — Other Ambulatory Visit (HOSPITAL_COMMUNITY): Payer: Self-pay

## 2021-10-30 ENCOUNTER — Other Ambulatory Visit (HOSPITAL_COMMUNITY): Payer: Self-pay

## 2021-10-31 ENCOUNTER — Other Ambulatory Visit (HOSPITAL_COMMUNITY): Payer: Self-pay

## 2021-11-01 ENCOUNTER — Other Ambulatory Visit (HOSPITAL_COMMUNITY): Payer: Self-pay

## 2021-11-09 ENCOUNTER — Other Ambulatory Visit (HOSPITAL_COMMUNITY): Payer: Self-pay

## 2021-11-23 ENCOUNTER — Other Ambulatory Visit (HOSPITAL_COMMUNITY): Payer: Self-pay

## 2021-12-01 ENCOUNTER — Other Ambulatory Visit: Payer: Self-pay

## 2021-12-01 ENCOUNTER — Emergency Department (HOSPITAL_COMMUNITY): Payer: Medicare Other

## 2021-12-01 ENCOUNTER — Emergency Department (HOSPITAL_COMMUNITY)
Admission: EM | Admit: 2021-12-01 | Discharge: 2021-12-02 | Disposition: A | Discharge status: Home / Self Care | Payer: Medicare Other | Attending: Emergency Medicine | Admitting: Emergency Medicine

## 2021-12-01 ENCOUNTER — Encounter (HOSPITAL_COMMUNITY): Payer: Self-pay | Admitting: Emergency Medicine

## 2021-12-01 DIAGNOSIS — E119 Type 2 diabetes mellitus without complications: Secondary | ICD-10-CM | POA: Diagnosis not present

## 2021-12-01 DIAGNOSIS — I10 Essential (primary) hypertension: Secondary | ICD-10-CM | POA: Insufficient documentation

## 2021-12-01 DIAGNOSIS — Y92410 Unspecified street and highway as the place of occurrence of the external cause: Secondary | ICD-10-CM | POA: Diagnosis not present

## 2021-12-01 DIAGNOSIS — Z7984 Long term (current) use of oral hypoglycemic drugs: Secondary | ICD-10-CM | POA: Diagnosis not present

## 2021-12-01 DIAGNOSIS — Z79899 Other long term (current) drug therapy: Secondary | ICD-10-CM | POA: Insufficient documentation

## 2021-12-01 DIAGNOSIS — M545 Low back pain, unspecified: Secondary | ICD-10-CM | POA: Diagnosis not present
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
## 2021-12-02 DIAGNOSIS — M545 Low back pain, unspecified: Secondary | ICD-10-CM | POA: Diagnosis not present

## 2021-12-02 MED ORDER — KETOROLAC TROMETHAMINE 30 MG/ML IJ SOLN: 30.0000 mg | Freq: Once | INTRAMUSCULAR | Status: DC

## 2021-12-02 MED ORDER — CYCLOBENZAPRINE HCL 5 MG PO TABS
7.5000 mg | ORAL_TABLET | Freq: Once | ORAL | Status: AC
  Administered 2021-12-02: 7.5 mg via ORAL
  Filled 2021-12-02: qty 1.5

## 2021-12-02 MED ORDER — TIZANIDINE HCL 4 MG PO TABS: 4.0000 mg | ORAL_TABLET | Freq: Four times a day (QID) | ORAL | 0 refills | Status: DC | PRN

## 2021-12-02 MED ORDER — KETOROLAC TROMETHAMINE 15 MG/ML IJ SOLN
15.0000 mg | Freq: Once | INTRAMUSCULAR | Status: AC
  Administered 2021-12-02: 15 mg via INTRAMUSCULAR
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
## 2021-12-03 NOTE — Progress Notes (Deleted)
PATIENT: [name] DOB: 02-Nov-1960  REASON FOR VISIT: follow up HISTORY FROM: patient  No chief complaint on file.    HISTORY OF PRESENT ILLNESS:  12/03/21 ALL: [name] returns for follow up for OSA on CPAP.   She was initially seen by Dr Roda Shutters in 11/2015 following hospitalization 10/09/2015 for LOC and GTC in setting of right temporal ICH. She was started on levetiracetam 750mg  BID, last written by our office 04/17/2018.   05/08/2021 ALL: [name] returns for follow up for OSA previously managed on CPAP. She was last seen 12/2018. At that time, she reported difficulty with compliance and felt that this was due to sinus/allergy symptoms. AHI was elevated and settings were changed from set pressure of 12cmH20 to AutoPAP with pressure range of 5-15cmH20. She used CPAP for about 2-3 more months then discontinued due to feeling that she couldn't breath when using her machine. Last usage 02/2019. Today, she returns requesting to resume CPAP. She states that she can not sleep due to waking up coughing. She wakes with a dry mouth. She is not able to lie flat for long periods of time due to feeling that she can't breathe. She is following with PCP as directed. She continues BP, DM and HLD management. No recent stroke symptoms. BP remains elevated. LDL was 48 in 05/2020. A1C 7.5 11/2020. She has not taken levetiracetam in about 2 years. No obvious seizure activity.     01/21/2019 ALL:  [name] is a 61 y.o. female here today for follow up for OSA on CPAP.  She is continue to work on compliance.  She received new supplies about a month ago.  She reports that sinus concerns have cleared up until the past week or so.  She is following closely with her primary care provider.  She is scheduled for follow-up on Monday.  Compliance data reviewed.  Compliance report dated 12/21/2018 through 01/19/2019 reveals that she has used CPAP 25 of the last 30 days for compliance of 83%.  She has used CPAP 17 of the  last 30 days for compliance of 57%.  Average usage was 5 hours and 25 minutes.  AHI remains elevated at 18.6 on a set pressure of 12 cm of water and an EPR of 3.  There is continued concern with a week noted in the 95th percentile of 37.6.  10/21/2018 ALL: [name] is a 61 y.o. female here today for follow up of OSA on CPAP. She is also seen for history of ICH and seizure disorder. Last seen by 58 in 03/2018 with noted suboptimal compliance of CPAP therapy. She continues to have chronic sinus issues that she states prevents her form using CPAP. She is working with PCP for management. She is having increased headaches that she feels may be related. She was unaware of the link between headaches and sleep apnea. She has not had new supplies for CPAP in over a year. She has been cleaning the mask she has and reusing.    Compliance report dated 09/20/2018 through 10/19/2018 reveals that she has used CPAP therapy 14 out of the last 30 days for compliance of 47%.  7 of those days she used CPAP therapy for greater than 4 hours for compliance of 23%.  Average usage was 2 hours and 3 minutes.  AHI remains elevated at 13.1 on 12 cm of water.  There is a significant leak noted in the 95th percentile of 47.1.   She continues close follow  up with PCP for chronic disease management. She continues BP, DMT2 and HLD medications as prescribed. She is taking levetiracetam 765m twice daily for seizure management. No seizure activity noted.    HISTORY: (copied from [name] on 04/20/2018)   Ms. Mary BETSILLis a 61y.o. female with history of morbid obesity and hypertension admitted on 10/06/15 for LOC and GTC seizure. CT showed right temporal small ICH, and repeat CT stable hematoma. CTA and CTV head without AVM or CVT. CUS, DVT screening and TTE unremarkable. LTM  EEG no seizure. LD 58 and A1C 5.7. She was put on keppra 7572mbid. She was discharged to CIR with planning outpt sleep study.    11/29/15  follow up Dr. XuErlinda Hongthe patient has been doing well. No seizure activity. She is not driving so far. However, she complains of right shoulder pain with limited ROM at right UE. Walks with cane at home. BP today 137/78 and also stable at home. BP controlled well with 2 BP meds.   Interval HistoryXU During the interval time, pt has been doing well. Had MRI and MRA in 11/2015 showed resolved bleeding and no AVM or aneurysm. He followed with Dr. PaPosey Prontoor right shoulder pain and was put on Voltaren gel which helped. She continued on keppra and no more seizures. She complains of turning head back from right lateral position fast causing dizzy, but it turning slow then no dizziness. Currently sleep study on hold due to no insurance. Still has outpt PT. BP today 132/78.   UPDATE 07/11/2018[name], [age]ear old female returns for follow-upwith a history of right temporal small ICH. Repeat CT stable and restarted on aspirin at her last visit.she has not had further stroke or TIA symptoms. She has minimal bruising or bleeding. She remains on Keppra for seizure disorder with no further seizure activity. She is back to driving without difficulty. She remains on Lipitor without complaints of myalgias. She is morbidly obese and needs a sleep study however insurance will not pay for this.She continues to see Dr. PaPosey Prontoor her right shoulder pain. She states that she is doing her home exercise program She returns for reevaluation without further neurologic symptoms   UPDATE 1/9/2019[name], [age]ear old female returns for follow-up degree of right temporal small ICH.  She is back on aspirin without further stroke or TIA symptoms.  No bleeding and no bruising.  She is also on Keppra for seizure disorder without side effects or recurrent seizures.  She remains on Lipitor stroke prevention without myalgias.  She is morbidly obese.  She now has Medicaid and can get her sleep study.  Has a lot of daytime drowsiness.She  complains of intermittent headache.  She takes Tylenol.  She continues to see Dr. PaPosey Prontoor right shoulder pain.  She uses Voltaren gel and Robaxin with fairly good pain relief.  She ambulates with a single-point cane she returns for reevaluation   Today, 10/08/2017:[name] I reviewed her CPAP compliance data from 09/07/2017 through 10/06/2017 which is a total of 30 days, during which time she used her CPAP every night with percent used days greater than 4 hours at 83%, indicating very good compliance with an average usage of 5 hours and 42 minutes, residual AHI suboptimal at 10.4 per hour, leak on the high side with the 95th percentile at 28.1 L/m on a pressure of 12 cm with EPR of 3. She reports she has had some recurrent headaches. She tried to get a different  mask today but the respiratory therapist was not at the DME office today. She reports that she has noted some benefit including better sleep consolidation, better sleep quality, less nocturia but she is using a fullface mask and has noticed a leak which blows cold air into her face and she wakes up feeling very cold. She also tries to adjust the head gear and has had some pressure headaches from adjusting it too tightly. She now has a mask fit appointment on Monday. She is motivated to continue with treatment and also interested in working harder on weight loss. She has met with a nutritionist in the past.   UPDATE 1/27/2020CM Ms. Rottmann, 61 year old female returns for follow-up with history of stroke right temporal small ICH.  She is back on aspirin without further stroke or TIA symptoms no bleeding and no bruising.  She is also on Keppra for seizure disorder without any recurrent seizures.  She remains on Lipitor without myalgias she is morbidly obese sleep study was positive for obstructive sleep apnea.  She continues to see [name] for right shoulder pain and mild right hemiparesis.  She had an ER admission back in October for acute maxillary sinusitis  she was placed on antibiotics and was told not to restart her CPAP until her sinus infection cleared she never has restarted her CPAP when last seen by Dr. Rexene Alberts in July she was 83% compliant at that time.  Compliance data dated 12/25/2017 to 01/23/2018 shows compliance greater than 4 hours at 60% less than 4 hours at 20% for time compliance of 80%.  Average usage 4 hours 47 minutes set pressure 12 cm AHI 3 ESS 11.  She stopped her CPAP shortly after this reading due to her sinus infection and never restarted.  She returns for reevaluation   REVIEW OF SYSTEMS: Out of a complete 14 system review of symptoms, the patient complains only of the following symptoms, headaches, weakness and all other reviewed systems are negative.  Epworth sleepiness scale: 14 Fatigue severity scale: 32  ALLERGIES: Allergies  Allergen Reactions   Bee Venom Swelling    Facial swelling   Eggs Or Egg-Derived Products Nausea And Vomiting and Swelling    Mouth swelled   Gabapentin Other (See Comments)    Jittery feeling, dyspnea, crawling sensation   Gadolinium Derivatives Itching    Immediately after gad injection pt. complained of tongue numbness and lower lip/ then had rt side of body tingling/ pt. Was assessed by Dr. Jeralyn Ruths before leaving/ no medications were administered/    HOME MEDICATIONS: Outpatient Medications Prior to Visit  Medication Sig Dispense Refill   Accu-Chek FastClix Lancets MISC Check blood sugar up to 3 times daily. E11.59 102 each 6   acetaminophen (TYLENOL) 325 MG tablet Take 2 tablets (650 mg total) by mouth every 6 (six) hours as needed for mild pain (or Fever >/= 101).     atorvastatin (LIPITOR) 20 MG tablet Take 1 tablet (20 mg total) by mouth daily. 90 tablet 3   azithromycin (ZITHROMAX) 250 MG tablet Take 2 tablets in the morning on Day 1 then take 1 tablet in the morning X 4 more days. 6 tablet 0   benzonatate (TESSALON) 100 MG capsule Take 1 capsule (100 mg total) by mouth 3 (three) times  daily as needed for cough 30 capsule 0   Blood Glucose Monitoring Suppl (ACCU-CHEK GUIDE ME) w/Device KIT 1 kit by Does not apply route daily. Check blood sugar up to 3 times daily. E11.59  1 kit 0   dapagliflozin propanediol (FARXIGA) 10 MG TABS tablet Take 1 tablet (10 mg total) by mouth daily. 30 tablet 0   diphenhydrAMINE (BENADRYL) 25 MG tablet Take 25 mg by mouth at bedtime as needed for itching or allergies.     ergocalciferol (VITAMIN D2) 1.25 MG (50000 UT) capsule Take 1 capsule (50,000 Units total) by mouth once a week on Sundays for 12 weeks. 12 capsule 0   ferrous sulfate 325 (65 FE) MG tablet one tablet po daily . 90 tablet 3   ferrous sulfate 325 (65 FE) MG tablet Take 1 tablet (325 mg total) by mouth daily. 30 tablet 11   fluticasone (FLONASE) 50 MCG/ACT nasal spray PLACE 1 SPRAY INTO BOTH NOSTRILS DAILY AS NEEDED FOR ALLERGIES OR RHINITIS. 16 g 0   fluticasone (FLONASE) 50 MCG/ACT nasal spray Place 2 sprays into both nostrils daily. 16 g 0   furosemide (LASIX) 20 MG tablet Take 1 tablet (20 mg total) by mouth daily. 90 tablet 3   glipiZIDE (GLUCOTROL) 5 MG tablet Take 0.5 tablets (2.5 mg total) by mouth 2 (two) times daily before a meal. (Patient taking differently: Take 5 mg by mouth daily.) 90 tablet 3   glucose blood (ACCU-CHEK GUIDE) test strip Check blood sugar up to 3 times daily. E11.59 100 each 6   loratadine (CLARITIN) 10 MG tablet Take 10 mg by mouth daily as needed for allergies.     meclizine (ANTIVERT) 25 MG tablet Take 25 mg by mouth 2 (two) times daily as needed for dizziness.     metFORMIN (GLUCOPHAGE-XR) 500 MG 24 hr tablet TAKE 2 TABLETS (1,000 MG TOTAL) BY MOUTH 2 (TWO) TIMES DAILY. (Patient not taking: Reported on 10/04/2021) 120 tablet 0   methocarbamol (ROBAXIN) 750 MG tablet Take 750 mg by mouth 2 (two) times daily as needed for muscle spasms.     metoprolol tartrate (LOPRESSOR) 50 MG tablet Take 1 tablet (50 mg total) by mouth 2 (two) times daily. 60 tablet 0    omeprazole (PRILOSEC) 40 MG capsule Take 1 capsule (40 mg total) by mouth daily. 90 capsule 3   sacubitril-valsartan (ENTRESTO) 49-51 MG Take 1 tablet by mouth in the morning AND 1 tablet every evening. 180 tablet 3   tiZANidine (ZANAFLEX) 4 MG tablet Take 1 tablet (4 mg total) by mouth every 6 (six) hours as needed for muscle spasms. 30 tablet 0   No facility-administered medications prior to visit.    PAST MEDICAL HISTORY: Past Medical History:  Diagnosis Date   Headache    Hypertension    Iron deficiency anemia    Obesity    Seizures ([place])    Stroke ([place])    Type 2 diabetes mellitus ([place])     PAST SURGICAL HISTORY: Past Surgical History:  Procedure Laterality Date   CARDIAC CATHETERIZATION  04/2010   Dr. Terrence Dupont   COLONOSCOPY WITH PROPOFOL N/A 04/06/2019   Procedure: COLONOSCOPY WITH PROPOFOL;  Surgeon: Mauri Pole, MD;  Location: [facility];  Service: Endoscopy;  Laterality: N/A;   POLYPECTOMY  04/06/2019   Procedure: POLYPECTOMY;  Surgeon: Mauri Pole, MD;  Location: [facility];  Service: Endoscopy;;    FAMILY HISTORY: Family History  Problem Relation Age of Onset   Diabetes Father    Hypertension Father    Hypertension Sister    Stroke Brother     SOCIAL HISTORY: Social History   Socioeconomic History   Marital status: Married    Spouse name: Not on  file   Number of children: Not on file   Years of education: Not on file   Highest education level: Not on file  Occupational History   Not on file  Tobacco Use   Smoking status: Never   Smokeless tobacco: Never  Vaping Use   Vaping Use: Never used  Substance and Sexual Activity   Alcohol use: No   Drug use: No   Sexual activity: Not Currently  Other Topics Concern   Not on file  Social History Narrative   Not on file   Social Determinants of Health   Financial Resource Strain: Low Risk  (12/06/2020)   Overall Financial Resource Strain (CARDIA)    Difficulty of Paying Living  Expenses: Not hard at all  Food Insecurity: No Food Insecurity (12/06/2020)   Hunger Vital Sign    Worried About Running Out of Food in the Last Year: Never true    Ran Out of Food in the Last Year: Never true  Transportation Needs: No Transportation Needs (12/06/2020)   PRAPARE - Hydrologist (Medical): No    Lack of Transportation (Non-Medical): No  Physical Activity: Inactive (12/06/2020)   Exercise Vital Sign    Days of Exercise per Week: 0 days    Minutes of Exercise per Session: 0 min  Stress: No Stress Concern Present (12/06/2020)   [place]    Feeling of Stress : Not at all  Social Connections: Moderately Integrated (12/06/2020)   Social Connection and Isolation Panel [NHANES]    Frequency of Communication with Friends and Family: Once a week    Frequency of Social Gatherings with Friends and Family: Once a week    Attends Religious Services: More than 4 times per year    Active Member of [employer] or Organizations: No    Attends Music therapist: More than 4 times per year    Marital Status: Married  Human resources officer Violence: Not At Risk (12/06/2020)   Humiliation, Afraid, Rape, and Kick questionnaire    Fear of Current or Ex-Partner: No    Emotionally Abused: No    Physically Abused: No    Sexually Abused: No      PHYSICAL EXAM  There were no vitals filed for this visit.   There is no height or weight on file to calculate BMI.  Generalized: Well developed, in no acute distress  Cardiology: normal rate and rhythm, no murmur noted Respiratory: Clear to auscultation bilaterally Neurological examination  Mentation: Alert oriented to time, place, history taking. Follows all commands speech and language fluent Cranial nerve II-XII: Pupils were equal round reactive to light. Extraocular movements were full, visual field were full on confrontational test.  Motor: The  motor testing reveals 5 over 5 strength of all 4 extremities. Good symmetric motor tone is noted throughout.  Gait and station: Gait is wide, stable with cane   DIAGNOSTIC DATA (LABS, IMAGING, TESTING) - I reviewed patient records, labs, notes, testing and imaging myself where available.     01/10/2021   10:09 AM  MMSE - Mini Mental State Exam  Orientation to time 4  Orientation to Place 5  Registration 3  Attention/ Calculation 5  Recall 3  Language- name 2 objects 2  Language- repeat 1  Language- follow 3 step command 1  Language- read & follow direction 1  Write a sentence 1  Copy design 0  Total score 26  Lab Results  Component Value Date   WBC 9.6 10/05/2021   HGB 10.5 (L) 10/05/2021   HCT 35.6 (L) 10/05/2021   MCV 72.7 (L) 10/05/2021   PLT 302 10/05/2021      Component Value Date/Time   NA 138 10/08/2021 0516   NA 143 06/14/2020 0911   K 4.0 10/08/2021 0516   CL 98 10/08/2021 0516   CO2 30 10/08/2021 0516   GLUCOSE 163 (H) 10/08/2021 0516   BUN 21 (H) 10/08/2021 0516   BUN 15 06/14/2020 0911   CREATININE 1.16 (H) 10/08/2021 0516   CREATININE 0.86 02/20/2016 0953   CALCIUM 9.2 10/08/2021 0516   PROT 7.5 10/05/2021 0333   PROT 8.0 10/23/2018 0936   ALBUMIN 3.1 (L) 10/05/2021 0333   ALBUMIN 3.7 (L) 10/23/2018 0936   AST 19 10/05/2021 0333   ALT 19 10/05/2021 0333   ALKPHOS 63 10/05/2021 0333   BILITOT 0.7 10/05/2021 0333   BILITOT 0.4 10/23/2018 0936   GFRNONAA 54 (L) 10/08/2021 0516   GFRNONAA 76 02/20/2016 0953   GFRAA >60 11/16/2019 0415   GFRAA 88 02/20/2016 0953   Lab Results  Component Value Date   CHOL 110 06/14/2020   HDL 43 06/14/2020   LDLCALC 48 06/14/2020   TRIG 98 06/14/2020   CHOLHDL 2.6 06/14/2020   Lab Results  Component Value Date   HGBA1C 6.7 (H) 10/05/2021   Lab Results  Component Value Date   [id number] 694 05/18/2010   Lab Results  Component Value Date   TSH 1.809 10/05/2021       ASSESSMENT AND PLAN 61  y.o. year old female  has a past medical history of Headache, Hypertension, Iron deficiency anemia, Obesity, Seizures ([place]), Stroke ([place]), and Type 2 diabetes mellitus ([place]). here with   No diagnosis found.    Shamanda has had difficulty using CPAP and no recent data available for review.  I have encouraged her to resume usage and continue working towards a goal of nightly use and greater than 4 hours each night. I will ask DME to work with her to ensure correct fit of mask and desensitizing as needed. Last data available for review was in 2020. Once she is using CPAP, I will assess download and make setting adjustments as indicated. She will continue close follow-up with primary care for chronic sinus concerns as well as management of comorbidities. Seizure precautions advised. She has not taken AED in over 2 years. She will follow-up with me in 4-6 months, sooner if needed.  She verbalizes understanding and agreement with this plan.   No orders of the defined types were placed in this encounter.    No orders of the defined types were placed in this encounter.     I spent 30 minutes of face-to-face and non-face-to-face time with patient.  This included previsit chart review, lab review, study review, order entry, electronic health record documentation, patient education.   Debbora Presto, FNP-C 12/03/2021, 9:10 AM [name] [address] [place], [place] [address] [phone]

## 2021-12-04 ENCOUNTER — Ambulatory Visit: Payer: Medicare Other | Admitting: Family Medicine

## 2021-12-11 ENCOUNTER — Other Ambulatory Visit (HOSPITAL_COMMUNITY): Payer: Self-pay

## 2021-12-11 NOTE — Progress Notes (Deleted)
PATIENT: [name] DOB: 1960-11-24  REASON FOR VISIT: follow up HISTORY FROM: patient  No chief complaint on file.    HISTORY OF PRESENT ILLNESS:  12/11/21 ALL: [name] returns for follow up for OSA on CPAP.   She was initially seen by Dr Erlinda Hong in 11/2015 following hospitalization 10/09/2015 for LOC and GTC in setting of right temporal ICH. She was started on levetiracetam 716m BID, last written by our office 04/17/2018.   05/08/2021 ALL: [name] for follow up for OSA previously managed on CPAP. She was last seen 12/2018. At that time, she reported difficulty with compliance and felt that this was due to sinus/allergy symptoms. AHI was elevated and settings were changed from set pressure of 12cmH20 to AutoPAP with pressure range of 5-15cmH20. She used CPAP for about 2-3 more months then discontinued due to feeling that she couldn't breath when using her machine. Last usage 02/2019. Today, she returns requesting to resume CPAP. She states that she can not sleep due to waking up coughing. She wakes with a dry mouth. She is not able to lie flat for long periods of time due to feeling that she can't breathe. She is following with PCP as directed. She continues BP, DM and HLD management. No recent stroke symptoms. BP remains elevated. LDL was 48 in 05/2020. A1C 7.5 11/2020. She has not taken levetiracetam in about 2 years. No obvious seizure activity.     01/21/2019 ALL:  Mary POKORNEYis a 61y.o. female here today for follow up for OSA on CPAP.  She is continue to work on compliance.  She received new supplies about a month ago.  She reports that sinus concerns have cleared up until the past week or so.  She is following closely with her primary care provider.  She is scheduled for follow-up on Monday.  Compliance data reviewed.  Compliance report dated 12/21/2018 through 01/19/2019 reveals that she has used CPAP 25 of the last 30 days for compliance of 83%.  She has used CPAP 17 of the  last 30 days for compliance of 57%.  Average usage was 5 hours and 25 minutes.  AHI remains elevated at 18.6 on a set pressure of 12 cm of water and an EPR of 3.  There is continued concern with a week noted in the 95th percentile of 37.6.  10/21/2018 ALL: Mary MCCARROLLis a 61y.o. female here today for follow up of OSA on CPAP. She is also seen for history of ICH and seizure disorder. Last seen by CHoyle Sauerin 03/2018 with noted suboptimal compliance of CPAP therapy. She continues to have chronic sinus issues that she states prevents her form using CPAP. She is working with PCP for management. She is having increased headaches that she feels may be related. She was unaware of the link between headaches and sleep apnea. She has not had new supplies for CPAP in over a year. She has been cleaning the mask she has and reusing.    Compliance report dated 09/20/2018 through 10/19/2018 reveals that she has used CPAP therapy 14 out of the last 30 days for compliance of 47%.  7 of those days she used CPAP therapy for greater than 4 hours for compliance of 23%.  Average usage was 2 hours and 3 minutes.  AHI remains elevated at 13.1 on 12 cm of water.  There is a significant leak noted in the 95th percentile of 47.1.   She continues close follow  up with PCP for chronic disease management. She continues BP, DMT2 and HLD medications as prescribed. She is taking levetiracetam 765m twice daily for seizure management. No seizure activity noted.    HISTORY: (copied from [name] on 04/20/2018)   Ms. Mary BETSILLis a 61y.o. female with history of morbid obesity and hypertension admitted on 10/06/15 for LOC and GTC seizure. CT showed right temporal small ICH, and repeat CT stable hematoma. CTA and CTV head without AVM or CVT. CUS, DVT screening and TTE unremarkable. LTM  EEG no seizure. LD 58 and A1C 5.7. She was put on keppra 7572mbid. She was discharged to CIR with planning outpt sleep study.    11/29/15  follow up Dr. XuErlinda Hongthe patient has been doing well. No seizure activity. She is not driving so far. However, she complains of right shoulder pain with limited ROM at right UE. Walks with cane at home. BP today 137/78 and also stable at home. BP controlled well with 2 BP meds.   Interval HistoryXU During the interval time, pt has been doing well. Had MRI and MRA in 11/2015 showed resolved bleeding and no AVM or aneurysm. He followed with Dr. PaPosey Prontoor right shoulder pain and was put on Voltaren gel which helped. She continued on keppra and no more seizures. She complains of turning head back from right lateral position fast causing dizzy, but it turning slow then no dizziness. Currently sleep study on hold due to no insurance. Still has outpt PT. BP today 132/78.   UPDATE 07/11/2018[name], [age]ear old female returns for follow-upwith a history of right temporal small ICH. Repeat CT stable and restarted on aspirin at her last visit.she has not had further stroke or TIA symptoms. She has minimal bruising or bleeding. She remains on Keppra for seizure disorder with no further seizure activity. She is back to driving without difficulty. She remains on Lipitor without complaints of myalgias. She is morbidly obese and needs a sleep study however insurance will not pay for this.She continues to see Dr. PaPosey Prontoor her right shoulder pain. She states that she is doing her home exercise program She returns for reevaluation without further neurologic symptoms   UPDATE 1/9/2019[name], [age]ear old female returns for follow-up degree of right temporal small ICH.  She is back on aspirin without further stroke or TIA symptoms.  No bleeding and no bruising.  She is also on Keppra for seizure disorder without side effects or recurrent seizures.  She remains on Lipitor stroke prevention without myalgias.  She is morbidly obese.  She now has Medicaid and can get her sleep study.  Has a lot of daytime drowsiness.She  complains of intermittent headache.  She takes Tylenol.  She continues to see Dr. PaPosey Prontoor right shoulder pain.  She uses Voltaren gel and Robaxin with fairly good pain relief.  She ambulates with a single-point cane she returns for reevaluation   Today, 10/08/2017:[name] I reviewed her CPAP compliance data from 09/07/2017 through 10/06/2017 which is a total of 30 days, during which time she used her CPAP every night with percent used days greater than 4 hours at 83%, indicating very good compliance with an average usage of 5 hours and 42 minutes, residual AHI suboptimal at 10.4 per hour, leak on the high side with the 95th percentile at 28.1 L/m on a pressure of 12 cm with EPR of 3. She reports she has had some recurrent headaches. She tried to get a different  mask today but the respiratory therapist was not at the DME office today. She reports that she has noted some benefit including better sleep consolidation, better sleep quality, less nocturia but she is using a fullface mask and has noticed a leak which blows cold air into her face and she wakes up feeling very cold. She also tries to adjust the head gear and has had some pressure headaches from adjusting it too tightly. She now has a mask fit appointment on Monday. She is motivated to continue with treatment and also interested in working harder on weight loss. She has met with a nutritionist in the past.   UPDATE 1/27/2020[name], 61 year old female returns for follow-up with history of stroke right temporal small ICH.  She is back on aspirin without further stroke or TIA symptoms no bleeding and no bruising.  She is also on Keppra for seizure disorder without any recurrent seizures.  She remains on Lipitor without myalgias she is morbidly obese sleep study was positive for obstructive sleep apnea.  She continues to see [name] for right shoulder pain and mild right hemiparesis.  She had an ER admission back in October for acute maxillary sinusitis  she was placed on antibiotics and was told not to restart her CPAP until her sinus infection cleared she never has restarted her CPAP when last seen by Dr. Rexene Alberts in July she was 83% compliant at that time.  Compliance data dated 12/25/2017 to 01/23/2018 shows compliance greater than 4 hours at 60% less than 4 hours at 20% for time compliance of 80%.  Average usage 4 hours 47 minutes set pressure 12 cm AHI 3 ESS 11.  She stopped her CPAP shortly after this reading due to her sinus infection and never restarted.  She returns for reevaluation   REVIEW OF SYSTEMS: Out of a complete 14 system review of symptoms, the patient complains only of the following symptoms, headaches, weakness and all other reviewed systems are negative.  Epworth sleepiness scale: 14 Fatigue severity scale: 32  ALLERGIES: Allergies  Allergen Reactions   Bee Venom Swelling    Facial swelling   Eggs Or Egg-Derived Products Nausea And Vomiting and Swelling    Mouth swelled   Gabapentin Other (See Comments)    Jittery feeling, dyspnea, crawling sensation   Gadolinium Derivatives Itching    Immediately after gad injection pt. complained of tongue numbness and lower lip/ then had rt side of body tingling/ pt. Was assessed by Dr. Jeralyn Ruths before leaving/ no medications were administered/    HOME MEDICATIONS: Outpatient Medications Prior to Visit  Medication Sig Dispense Refill   Accu-Chek FastClix Lancets MISC Check blood sugar up to 3 times daily. E11.59 102 each 6   acetaminophen (TYLENOL) 325 MG tablet Take 2 tablets (650 mg total) by mouth every 6 (six) hours as needed for mild pain (or Fever >/= 101).     atorvastatin (LIPITOR) 20 MG tablet Take 1 tablet (20 mg total) by mouth daily. 90 tablet 3   azithromycin (ZITHROMAX) 250 MG tablet Take 2 tablets in the morning on Day 1 then take 1 tablet in the morning X 4 more days. 6 tablet 0   benzonatate (TESSALON) 100 MG capsule Take 1 capsule (100 mg total) by mouth 3 (three) times  daily as needed for cough 30 capsule 0   Blood Glucose Monitoring Suppl (ACCU-CHEK GUIDE ME) w/Device KIT 1 kit by Does not apply route daily. Check blood sugar up to 3 times daily. E11.59  1 kit 0   dapagliflozin propanediol (FARXIGA) 10 MG TABS tablet Take 1 tablet (10 mg total) by mouth daily. 30 tablet 0   diphenhydrAMINE (BENADRYL) 25 MG tablet Take 25 mg by mouth at bedtime as needed for itching or allergies.     ergocalciferol (VITAMIN D2) 1.25 MG (50000 UT) capsule Take 1 capsule (50,000 Units total) by mouth once a week on Sundays for 12 weeks. 12 capsule 0   ferrous sulfate 325 (65 FE) MG tablet one tablet po daily . 90 tablet 3   ferrous sulfate 325 (65 FE) MG tablet Take 1 tablet (325 mg total) by mouth daily. 30 tablet 11   fluticasone (FLONASE) 50 MCG/ACT nasal spray PLACE 1 SPRAY INTO BOTH NOSTRILS DAILY AS NEEDED FOR ALLERGIES OR RHINITIS. 16 g 0   fluticasone (FLONASE) 50 MCG/ACT nasal spray Place 2 sprays into both nostrils daily. 16 g 0   furosemide (LASIX) 20 MG tablet Take 1 tablet (20 mg total) by mouth daily. 90 tablet 3   glipiZIDE (GLUCOTROL) 5 MG tablet Take 0.5 tablets (2.5 mg total) by mouth 2 (two) times daily before a meal. (Patient taking differently: Take 5 mg by mouth daily.) 90 tablet 3   glucose blood (ACCU-CHEK GUIDE) test strip Check blood sugar up to 3 times daily. E11.59 100 each 6   loratadine (CLARITIN) 10 MG tablet Take 10 mg by mouth daily as needed for allergies.     meclizine (ANTIVERT) 25 MG tablet Take 25 mg by mouth 2 (two) times daily as needed for dizziness.     metFORMIN (GLUCOPHAGE-XR) 500 MG 24 hr tablet TAKE 2 TABLETS (1,000 MG TOTAL) BY MOUTH 2 (TWO) TIMES DAILY. (Patient not taking: Reported on 10/04/2021) 120 tablet 0   methocarbamol (ROBAXIN) 750 MG tablet Take 750 mg by mouth 2 (two) times daily as needed for muscle spasms.     metoprolol tartrate (LOPRESSOR) 50 MG tablet Take 1 tablet (50 mg total) by mouth 2 (two) times daily. 60 tablet 0    omeprazole (PRILOSEC) 40 MG capsule Take 1 capsule (40 mg total) by mouth daily. 90 capsule 3   sacubitril-valsartan (ENTRESTO) 49-51 MG Take 1 tablet by mouth in the morning AND 1 tablet every evening. 180 tablet 3   tiZANidine (ZANAFLEX) 4 MG tablet Take 1 tablet (4 mg total) by mouth every 6 (six) hours as needed for muscle spasms. 30 tablet 0   No facility-administered medications prior to visit.    PAST MEDICAL HISTORY: Past Medical History:  Diagnosis Date   Headache    Hypertension    Iron deficiency anemia    Obesity    Seizures ([place])    Stroke ([place])    Type 2 diabetes mellitus ([place])     PAST SURGICAL HISTORY: Past Surgical History:  Procedure Laterality Date   CARDIAC CATHETERIZATION  04/2010   Dr. Terrence Dupont   COLONOSCOPY WITH PROPOFOL N/A 04/06/2019   Procedure: COLONOSCOPY WITH PROPOFOL;  Surgeon: Mauri Pole, MD;  Location: [facility];  Service: Endoscopy;  Laterality: N/A;   POLYPECTOMY  04/06/2019   Procedure: POLYPECTOMY;  Surgeon: Mauri Pole, MD;  Location: [facility];  Service: Endoscopy;;    FAMILY HISTORY: Family History  Problem Relation Age of Onset   Diabetes Father    Hypertension Father    Hypertension Sister    Stroke Brother     SOCIAL HISTORY: Social History   Socioeconomic History   Marital status: Married    Spouse name: Not on  file   Number of children: Not on file   Years of education: Not on file   Highest education level: Not on file  Occupational History   Not on file  Tobacco Use   Smoking status: Never   Smokeless tobacco: Never  Vaping Use   Vaping Use: Never used  Substance and Sexual Activity   Alcohol use: No   Drug use: No   Sexual activity: Not Currently  Other Topics Concern   Not on file  Social History Narrative   Not on file   Social Determinants of Health   Financial Resource Strain: Low Risk  (12/06/2020)   Overall Financial Resource Strain (CARDIA)    Difficulty of Paying Living  Expenses: Not hard at all  Food Insecurity: No Food Insecurity (12/06/2020)   Hunger Vital Sign    Worried About Running Out of Food in the Last Year: Never true    Ran Out of Food in the Last Year: Never true  Transportation Needs: No Transportation Needs (12/06/2020)   PRAPARE - Hydrologist (Medical): No    Lack of Transportation (Non-Medical): No  Physical Activity: Inactive (12/06/2020)   Exercise Vital Sign    Days of Exercise per Week: 0 days    Minutes of Exercise per Session: 0 min  Stress: No Stress Concern Present (12/06/2020)   [place]    Feeling of Stress : Not at all  Social Connections: Moderately Integrated (12/06/2020)   Social Connection and Isolation Panel [NHANES]    Frequency of Communication with Friends and Family: Once a week    Frequency of Social Gatherings with Friends and Family: Once a week    Attends Religious Services: More than 4 times per year    Active Member of [employer] or Organizations: No    Attends Music therapist: More than 4 times per year    Marital Status: Married  Human resources officer Violence: Not At Risk (12/06/2020)   Humiliation, Afraid, Rape, and Kick questionnaire    Fear of Current or Ex-Partner: No    Emotionally Abused: No    Physically Abused: No    Sexually Abused: No      PHYSICAL EXAM  There were no vitals filed for this visit.   There is no height or weight on file to calculate BMI.  Generalized: Well developed, in no acute distress  Cardiology: normal rate and rhythm, no murmur noted Respiratory: Clear to auscultation bilaterally Neurological examination  Mentation: Alert oriented to time, place, history taking. Follows all commands speech and language fluent Cranial nerve II-XII: Pupils were equal round reactive to light. Extraocular movements were full, visual field were full on confrontational test.  Motor: The  motor testing reveals 5 over 5 strength of all 4 extremities. Good symmetric motor tone is noted throughout.  Gait and station: Gait is wide, stable with cane   DIAGNOSTIC DATA (LABS, IMAGING, TESTING) - I reviewed patient records, labs, notes, testing and imaging myself where available.     01/10/2021   10:09 AM  MMSE - Mini Mental State Exam  Orientation to time 4  Orientation to Place 5  Registration 3  Attention/ Calculation 5  Recall 3  Language- name 2 objects 2  Language- repeat 1  Language- follow 3 step command 1  Language- read & follow direction 1  Write a sentence 1  Copy design 0  Total score 26  Lab Results  Component Value Date   WBC 9.6 10/05/2021   HGB 10.5 (L) 10/05/2021   HCT 35.6 (L) 10/05/2021   MCV 72.7 (L) 10/05/2021   PLT 302 10/05/2021      Component Value Date/Time   NA 138 10/08/2021 0516   NA 143 06/14/2020 0911   K 4.0 10/08/2021 0516   CL 98 10/08/2021 0516   CO2 30 10/08/2021 0516   GLUCOSE 163 (H) 10/08/2021 0516   BUN 21 (H) 10/08/2021 0516   BUN 15 06/14/2020 0911   CREATININE 1.16 (H) 10/08/2021 0516   CREATININE 0.86 02/20/2016 0953   CALCIUM 9.2 10/08/2021 0516   PROT 7.5 10/05/2021 0333   PROT 8.0 10/23/2018 0936   ALBUMIN 3.1 (L) 10/05/2021 0333   ALBUMIN 3.7 (L) 10/23/2018 0936   AST 19 10/05/2021 0333   ALT 19 10/05/2021 0333   ALKPHOS 63 10/05/2021 0333   BILITOT 0.7 10/05/2021 0333   BILITOT 0.4 10/23/2018 0936   GFRNONAA 54 (L) 10/08/2021 0516   GFRNONAA 76 02/20/2016 0953   GFRAA >60 11/16/2019 0415   GFRAA 88 02/20/2016 0953   Lab Results  Component Value Date   CHOL 110 06/14/2020   HDL 43 06/14/2020   LDLCALC 48 06/14/2020   TRIG 98 06/14/2020   CHOLHDL 2.6 06/14/2020   Lab Results  Component Value Date   HGBA1C 6.7 (H) 10/05/2021   Lab Results  Component Value Date   XBJYNWGN56 213 05/18/2010   Lab Results  Component Value Date   TSH 1.809 10/05/2021       ASSESSMENT AND PLAN 61  y.o. year old female  has a past medical history of Headache, Hypertension, Iron deficiency anemia, Obesity, Seizures ([place]), Stroke ([place]), and Type 2 diabetes mellitus ([place]). here with   No diagnosis found.    Lahna has had difficulty using CPAP and no recent data available for review.  I have encouraged her to resume usage and continue working towards a goal of nightly use and greater than 4 hours each night. I will ask DME to work with her to ensure correct fit of mask and desensitizing as needed. Last data available for review was in 2020. Once she is using CPAP, I will assess download and make setting adjustments as indicated. She will continue close follow-up with primary care for chronic sinus concerns as well as management of comorbidities. Seizure precautions advised. She has not taken AED in over 2 years. She will follow-up with me in 4-6 months, sooner if needed.  She verbalizes understanding and agreement with this plan.   No orders of the defined types were placed in this encounter.    No orders of the defined types were placed in this encounter.     I spent 30 minutes of face-to-face and non-face-to-face time with patient.  This included previsit chart review, lab review, study review, order entry, electronic health record documentation, patient education.   Debbora Presto, FNP-C 12/11/2021, 4:23 PM [name] [address] [place], [place] [address] [phone]

## 2021-12-12 ENCOUNTER — Telehealth: Payer: Self-pay | Admitting: *Deleted

## 2021-12-12 NOTE — Telephone Encounter (Signed)
Called pt. She has not been using CPAP since before her last visit. She will still bring machine to appt.  She states she didn't know who to contact to fix issues she was having w/ machine.  Advised I see phone call where Myriam Jacobson, RN spoke w/ her 05/28/21 and provided # to DME: [employer] to call. She states she called but never heard back. She will discuss further w/ NP tomorrow.

## 2021-12-13 ENCOUNTER — Encounter: Payer: Self-pay | Admitting: Family Medicine

## 2021-12-13 ENCOUNTER — Ambulatory Visit (INDEPENDENT_AMBULATORY_CARE_PROVIDER_SITE_OTHER): Payer: Medicare Other | Admitting: Family Medicine

## 2021-12-13 ENCOUNTER — Ambulatory Visit: Payer: Medicare Other | Admitting: Family Medicine

## 2021-12-13 VITALS — BP 160/74 | HR 73 | Ht 59.0 in | Wt 297.2 lb

## 2021-12-13 DIAGNOSIS — G4733 Obstructive sleep apnea (adult) (pediatric): Secondary | ICD-10-CM

## 2021-12-13 DIAGNOSIS — G40909 Epilepsy, unspecified, not intractable, without status epilepticus: Secondary | ICD-10-CM | POA: Diagnosis not present

## 2021-12-13 DIAGNOSIS — I611 Nontraumatic intracerebral hemorrhage in hemisphere, cortical: Secondary | ICD-10-CM | POA: Diagnosis not present

## 2021-12-13 NOTE — Patient Instructions (Addendum)
Please continue using your CPAP regularly. While your insurance requires that you use CPAP at least 4 hours each night on 70% of the nights, I recommend, that you not skip any nights and use it throughout the night if you can. Getting used to CPAP and staying with the treatment long term does take time and patience and discipline. Untreated obstructive sleep apnea when it is moderate to severe can have an adverse impact on cardiovascular health and raise her risk for heart disease, arrhythmias, hypertension, congestive heart failure, stroke and diabetes. Untreated obstructive sleep apnea causes sleep disruption, nonrestorative sleep, and sleep deprivation. This can have an impact on your day to day functioning and cause daytime sleepiness and impairment of cognitive function, memory loss, mood disturbance, and problems focussing. Using CPAP regularly can improve these symptoms.   Please call [facility] to discuss concerns of incorrect supplies  DME: [facility] Phone: [phone]  Once back on CPAP for at least 60 days call me and we schedule follow up visit.
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
## 2021-12-26 ENCOUNTER — Other Ambulatory Visit (HOSPITAL_COMMUNITY): Payer: Self-pay

## 2021-12-26 MED ORDER — METHOCARBAMOL 750 MG PO TABS
750.0000 mg | ORAL_TABLET | Freq: Two times a day (BID) | ORAL | 3 refills | Status: DC | PRN
  Filled 2021-12-26: qty 20, 10d supply, fill #0

## 2022-01-02 ENCOUNTER — Ambulatory Visit (INDEPENDENT_AMBULATORY_CARE_PROVIDER_SITE_OTHER): Payer: Medicare Other | Admitting: Gastroenterology

## 2022-01-02 ENCOUNTER — Encounter: Payer: Self-pay | Admitting: Gastroenterology

## 2022-01-02 VITALS — BP 160/102 | HR 70 | Ht 59.0 in | Wt 297.0 lb

## 2022-01-02 DIAGNOSIS — E662 Morbid (severe) obesity with alveolar hypoventilation: Secondary | ICD-10-CM

## 2022-01-02 DIAGNOSIS — R1310 Dysphagia, unspecified: Secondary | ICD-10-CM

## 2022-01-02 MED ORDER — AMBULATORY NON FORMULARY MEDICATION: 0 refills | Status: AC

## 2022-01-02 NOTE — Progress Notes (Signed)
[name]    [id number]    [birth year]  Primary Care Physician:Newlin, Charlane Ferretti, MD  Referring Physician: Willene Hatchet, NP [place],  [place] [address]   Chief complaint:  Dysphagia  HPI:  61 year old very pleasant female here with complaints of difficulty swallowing, especially with solid food.  She feels a lot of mucus buildup back of her throat when she tries to eat sometimes she gags.  She has not lost any weight.  She is now able to eat like she did before.  Denies any vomiting.  No change in bowel habits.  No family history of GI malignancy.   Outpatient Encounter Medications as of 01/02/2022  Medication Sig   Accu-Chek FastClix Lancets MISC Check blood sugar up to 3 times daily. E11.59   acetaminophen (TYLENOL) 325 MG tablet Take 2 tablets (650 mg total) by mouth every 6 (six) hours as needed for mild pain (or Fever >/= 101).   atorvastatin (LIPITOR) 20 MG tablet Take 1 tablet (20 mg total) by mouth daily.   Blood Glucose Monitoring Suppl (ACCU-CHEK GUIDE ME) w/Device KIT 1 kit by Does not apply route daily. Check blood sugar up to 3 times daily. E11.59   dapagliflozin propanediol (FARXIGA) 10 MG TABS tablet Take 1 tablet (10 mg total) by mouth daily.   diphenhydrAMINE (BENADRYL) 25 MG tablet Take 25 mg by mouth at bedtime as needed for itching or allergies.   ergocalciferol (VITAMIN D2) 1.25 MG (50000 UT) capsule Take 1 capsule (50,000 Units total) by mouth once a week on Sundays for 12 weeks.   ferrous sulfate 325 (65 FE) MG tablet one tablet po daily .   fluticasone (FLONASE) 50 MCG/ACT nasal spray Place 2 sprays into both nostrils daily.   furosemide (LASIX) 20 MG tablet Take 1 tablet (20 mg total) by mouth daily.   glipiZIDE (GLUCOTROL) 5 MG tablet Take 0.5 tablets (2.5 mg total) by mouth 2 (two) times daily before a meal. (Patient taking differently: Take 5 mg by mouth daily.)   glucose blood (ACCU-CHEK GUIDE) test strip Check  blood sugar up to 3 times daily. E11.59   loratadine (CLARITIN) 10 MG tablet Take 10 mg by mouth daily as needed for allergies.   meclizine (ANTIVERT) 25 MG tablet Take 25 mg by mouth 2 (two) times daily as needed for dizziness.   metFORMIN (GLUCOPHAGE-XR) 500 MG 24 hr tablet TAKE 2 TABLETS (1,000 MG TOTAL) BY MOUTH 2 (TWO) TIMES DAILY.   methocarbamol (ROBAXIN) 750 MG tablet Take 750 mg by mouth 2 (two) times daily as needed for muscle spasms.   methocarbamol (ROBAXIN) 750 MG tablet Take 1 tablet (750 mg total) by mouth in the morning and at bedtime as needed.   omeprazole (PRILOSEC) 40 MG capsule Take 1 capsule (40 mg total) by mouth daily.   sacubitril-valsartan (ENTRESTO) 49-51 MG Take 1 tablet by mouth in the morning AND 1 tablet every evening.   tiZANidine (ZANAFLEX) 4 MG tablet Take 1 tablet (4 mg total) by mouth every 6 (six) hours as needed for muscle spasms.   metoprolol tartrate (LOPRESSOR) 50 MG tablet Take 1 tablet (50 mg total) by mouth 2 (two) times daily.   No facility-administered encounter medications on file as of 01/02/2022.    Allergies as of 01/02/2022 - Review Complete 01/02/2022  Allergen Reaction Noted   Bee venom Swelling 04/21/2017   Eggs or egg-derived products Nausea And Vomiting and  Swelling 11/29/2015   Gabapentin Other (See Comments) 09/10/2016   Gadolinium derivatives Itching 12/12/2015    Past Medical History:  Diagnosis Date   Headache    Hypertension    Iron deficiency anemia    Obesity    Seizures ([place])    Stroke ([place])    Type 2 diabetes mellitus ([place])     Past Surgical History:  Procedure Laterality Date   CARDIAC CATHETERIZATION  04/2010   Dr. Terrence Dupont   COLONOSCOPY WITH PROPOFOL N/A 04/06/2019   Procedure: COLONOSCOPY WITH PROPOFOL;  Surgeon: Mauri Pole, MD;  Location: [facility];  Service: Endoscopy;  Laterality: N/A;   POLYPECTOMY  04/06/2019   Procedure: POLYPECTOMY;  Surgeon: Mauri Pole, MD;  Location: [facility];   Service: Endoscopy;;    Family History  Problem Relation Age of Onset   Diabetes Father    Hypertension Father    Hypertension Sister    Stroke Brother     Social History   Socioeconomic History   Marital status: Married    Spouse name: Not on file   Number of children: Not on file   Years of education: Not on file   Highest education level: Not on file  Occupational History   Not on file  Tobacco Use   Smoking status: Never   Smokeless tobacco: Never  Vaping Use   Vaping Use: Never used  Substance and Sexual Activity   Alcohol use: No   Drug use: No   Sexual activity: Not Currently  Other Topics Concern   Not on file  Social History Narrative   Not on file   Social Determinants of Health   Financial Resource Strain: Low Risk  (12/06/2020)   Overall Financial Resource Strain (CARDIA)    Difficulty of Paying Living Expenses: Not hard at all  Food Insecurity: No Food Insecurity (12/06/2020)   Hunger Vital Sign    Worried About Running Out of Food in the Last Year: Never true    [place] in the Last Year: Never true  Transportation Needs: No Transportation Needs (12/06/2020)   PRAPARE - Hydrologist (Medical): No    Lack of Transportation (Non-Medical): No  Physical Activity: Inactive (12/06/2020)   Exercise Vital Sign    Days of Exercise per Week: 0 days    Minutes of Exercise per Session: 0 min  Stress: No Stress Concern Present (12/06/2020)   [place]    Feeling of Stress : Not at all  Social Connections: Moderately Integrated (12/06/2020)   Social Connection and Isolation Panel [NHANES]    Frequency of Communication with Friends and Family: Once a week    Frequency of Social Gatherings with Friends and Family: Once a week    Attends Religious Services: More than 4 times per year    Active Member of [employer] or Organizations: No    Attends Arts administrator: More than 4 times per year    Marital Status: Married  Human resources officer Violence: Not At Risk (12/06/2020)   Humiliation, Afraid, Rape, and Kick questionnaire    Fear of Current or Ex-Partner: No    Emotionally Abused: No    Physically Abused: No    Sexually Abused: No      Review of systems: All other review of systems negative except as mentioned in the HPI.   Physical Exam: Vitals:   01/02/22 1421  BP: (Abnormal) 160/102  Pulse:  70   Body mass index is 59.99 kg/m. Gen:      No acute distress HEENT:  sclera anicteric Abd:      soft, non-tender; no palpable masses, no distension Ext:    No edema Neuro: alert and oriented x 3 Psych: normal mood and affect  Data Reviewed:  Reviewed labs, radiology imaging, old records and pertinent past GI work up   Assessment and Plan/Recommendations:  61 year old very pleasant female with morbid obesity, BMI~60 with complaints of solid dysphagia We will obtain barium esophagram to exclude any high risk lesions Patient is at risk for significant anesthesia related complications due to her high BMI, if there are any high risk lesions on barium esophagram will plan to proceed with EGD for further evaluation to be scheduled at hospital endoscopy unit Advised patient to eat small bites and to chew well Discussed dietary modifications and exercise Advised patient to consider bariatric surgery for weight loss, she will discuss options with her PMD and request referral   The patient was provided an opportunity to ask questions and all were answered. The patient agreed with the plan and demonstrated an understanding of the instructions.  Damaris Hippo , MD    CC: Willene Hatchet, NP

## 2022-01-02 NOTE — Patient Instructions (Addendum)
You have been scheduled for a Barium Esophogram at [facility] (1st floor of the hospital) on 01/10/22 at 10:30am . Please arrive  30 minutes prior to your appointment for registration. Make certain not to have anything to eat or drink 3 hours prior to your test. If you need to reschedule for any reason, please contact radiology at [phone] to do so. __________________________________________________________________ A barium swallow is an examination that concentrates on views of the esophagus. This tends to be a double contrast exam (barium and two liquids which, when combined, create a gas to distend the wall of the oesophagus) or single contrast (non-ionic iodine based). The study is usually tailored to your symptoms so a good history is essential. Attention is paid during the study to the form, structure and configuration of the esophagus, looking for functional disorders (such as aspiration, dysphagia, achalasia, motility and reflux) EXAMINATION You may be asked to change into a gown, depending on the type of swallow being performed. A radiologist and radiographer will perform the procedure. The radiologist will advise you of the type of contrast selected for your procedure and direct you during the exam. You will be asked to stand, sit or lie in several different positions and to hold a small amount of fluid in your mouth before being asked to swallow while the imaging is performed .In some instances you may be asked to swallow barium coated marshmallows to assess the motility of a solid food bolus. The exam can be recorded as a digital or video fluoroscopy procedure. POST PROCEDURE It will take 1-2 days for the barium to pass through your system. To facilitate this, it is important, unless otherwise directed, to increase your fluids for the next 24-48hrs and to resume your normal diet.  This test typically takes about 30 minutes to  perform. _______________________________________________________________________      We have sent the following medications to your pharmacy for you to pick up at your convenience: Incentive Spirometer  hand written prescription to be taken to a [facility]    Follow up in 3 months -office will contact you to schedule.   Thank you for choosing me and [facility].  Dr. Silverio Decamp

## 2022-01-03 ENCOUNTER — Other Ambulatory Visit (HOSPITAL_COMMUNITY): Payer: Self-pay

## 2022-01-10 ENCOUNTER — Ambulatory Visit (HOSPITAL_COMMUNITY): Admission: RE | Admit: 2022-01-10 | Payer: Medicare Other | Source: Ambulatory Visit

## 2022-01-29 ENCOUNTER — Encounter: Payer: Self-pay | Admitting: Gastroenterology

## 2022-02-05 ENCOUNTER — Other Ambulatory Visit: Payer: Self-pay | Admitting: Nurse Practitioner

## 2022-02-05 DIAGNOSIS — R519 Headache, unspecified: Secondary | ICD-10-CM

## 2022-03-02 ENCOUNTER — Ambulatory Visit
Admission: RE | Admit: 2022-03-02 | Discharge: 2022-03-02 | Disposition: A | Discharge status: Home / Self Care | Payer: Medicare Other | Source: Ambulatory Visit | Attending: Nurse Practitioner | Admitting: Nurse Practitioner

## 2022-03-02 DIAGNOSIS — R519 Headache, unspecified: Secondary | ICD-10-CM

## 2022-03-07 ENCOUNTER — Other Ambulatory Visit (HOSPITAL_COMMUNITY): Payer: Self-pay

## 2022-03-14 ENCOUNTER — Other Ambulatory Visit: Payer: Self-pay

## 2022-03-19 ENCOUNTER — Other Ambulatory Visit (HOSPITAL_COMMUNITY): Payer: Self-pay

## 2022-03-19 MED ORDER — MECLIZINE HCL 25 MG PO TABS
25.0000 mg | ORAL_TABLET | Freq: Two times a day (BID) | ORAL | 3 refills | Status: DC | PRN
  Filled 2022-03-19: qty 60, 30d supply, fill #0

## 2022-03-28 ENCOUNTER — Ambulatory Visit: Payer: Medicare Other | Admitting: Family Medicine

## 2022-03-29 ENCOUNTER — Other Ambulatory Visit (HOSPITAL_COMMUNITY): Payer: Self-pay

## 2022-05-10 ENCOUNTER — Other Ambulatory Visit (HOSPITAL_COMMUNITY): Payer: Self-pay

## 2022-05-10 MED ORDER — CLONIDINE HCL 0.1 MG PO TABS
0.1000 mg | ORAL_TABLET | Freq: Two times a day (BID) | ORAL | 11 refills | Status: DC
  Filled 2022-05-10: qty 60, 30d supply, fill #0
  Filled 2022-06-10: qty 60, 30d supply, fill #1
  Filled 2022-07-09: qty 60, 30d supply, fill #2
  Filled 2022-08-15: qty 60, 30d supply, fill #3
  Filled 2022-08-28 – 2022-09-19 (×2): qty 60, 30d supply, fill #4
  Filled 2022-10-18: qty 60, 30d supply, fill #5
  Filled 2022-11-25: qty 60, 30d supply, fill #6
  Filled 2022-12-30: qty 60, 30d supply, fill #7
  Filled 2023-02-07: qty 60, 30d supply, fill #8
  Filled 2023-03-07: qty 60, 30d supply, fill #9
  Filled 2023-04-12: qty 60, 30d supply, fill #10
  Filled 2023-05-10: qty 60, 30d supply, fill #11

## 2022-05-10 MED ORDER — MOUNJARO 2.5 MG/0.5ML ~~LOC~~ SOAJ
2.5000 mg | SUBCUTANEOUS | 0 refills | Status: DC
  Filled 2022-05-10: qty 2, 28d supply, fill #0

## 2022-05-28 ENCOUNTER — Other Ambulatory Visit (HOSPITAL_COMMUNITY): Payer: Self-pay

## 2022-06-11 ENCOUNTER — Other Ambulatory Visit (HOSPITAL_COMMUNITY): Payer: Self-pay

## 2022-07-09 ENCOUNTER — Other Ambulatory Visit (HOSPITAL_COMMUNITY): Payer: Self-pay

## 2022-07-25 ENCOUNTER — Other Ambulatory Visit (HOSPITAL_COMMUNITY): Payer: Self-pay

## 2022-07-25 MED ORDER — METHOCARBAMOL 750 MG PO TABS
750.0000 mg | ORAL_TABLET | Freq: Two times a day (BID) | ORAL | 3 refills | Status: DC | PRN
  Filled 2022-07-25: qty 20, 10d supply, fill #0
  Filled 2022-10-21: qty 20, 10d supply, fill #1
  Filled 2022-10-28: qty 20, 10d supply, fill #2
  Filled 2022-11-17: qty 20, 10d supply, fill #3

## 2022-08-01 ENCOUNTER — Other Ambulatory Visit (HOSPITAL_COMMUNITY): Payer: Self-pay

## 2022-08-01 MED ORDER — ENTRESTO 49-51 MG PO TABS
1.0000 | ORAL_TABLET | Freq: Two times a day (BID) | ORAL | 3 refills | Status: DC
Start: 2022-08-01 — End: 2023-09-01
  Filled 2022-08-01: qty 180, 90d supply, fill #0
  Filled 2022-10-28: qty 180, 90d supply, fill #1
  Filled 2023-02-07: qty 180, 90d supply, fill #2
  Filled 2023-05-10: qty 180, 90d supply, fill #3

## 2022-08-01 MED ORDER — MOUNJARO 5 MG/0.5ML ~~LOC~~ SOAJ
5.0000 mg | SUBCUTANEOUS | 5 refills | Status: DC
  Filled 2022-08-01: qty 6, 84d supply, fill #0
  Filled 2022-08-01: qty 2, 28d supply, fill #0
  Filled 2022-08-28: qty 2, 28d supply, fill #1

## 2022-08-27 ENCOUNTER — Other Ambulatory Visit (HOSPITAL_COMMUNITY): Payer: Self-pay

## 2022-08-27 MED ORDER — MOUNJARO 7.5 MG/0.5ML ~~LOC~~ SOAJ
7.5000 mg | SUBCUTANEOUS | 3 refills | Status: DC
Start: 2022-08-27 — End: 2022-12-30
  Filled 2022-08-27: qty 2, 28d supply, fill #0
  Filled 2022-09-22: qty 2, 28d supply, fill #1
  Filled 2022-10-20: qty 2, 28d supply, fill #2
  Filled 2022-11-17: qty 2, 28d supply, fill #3
  Filled 2022-12-15: qty 2, 28d supply, fill #4

## 2022-08-28 ENCOUNTER — Other Ambulatory Visit: Payer: Self-pay

## 2022-09-02 ENCOUNTER — Other Ambulatory Visit (HOSPITAL_COMMUNITY): Payer: Self-pay

## 2022-09-18 ENCOUNTER — Other Ambulatory Visit (HOSPITAL_COMMUNITY): Payer: Self-pay

## 2022-10-21 ENCOUNTER — Other Ambulatory Visit (HOSPITAL_COMMUNITY): Payer: Self-pay

## 2022-10-21 ENCOUNTER — Other Ambulatory Visit: Payer: Self-pay

## 2022-10-28 ENCOUNTER — Other Ambulatory Visit (HOSPITAL_COMMUNITY): Payer: Self-pay

## 2022-11-26 ENCOUNTER — Other Ambulatory Visit (HOSPITAL_COMMUNITY): Payer: Self-pay

## 2022-12-01 ENCOUNTER — Other Ambulatory Visit (HOSPITAL_COMMUNITY): Payer: Self-pay

## 2022-12-03 ENCOUNTER — Other Ambulatory Visit (HOSPITAL_COMMUNITY): Payer: Self-pay

## 2022-12-04 ENCOUNTER — Other Ambulatory Visit (HOSPITAL_COMMUNITY): Payer: Self-pay

## 2022-12-04 MED ORDER — METHOCARBAMOL 750 MG PO TABS
750.0000 mg | ORAL_TABLET | Freq: Two times a day (BID) | ORAL | 2 refills | Status: DC | PRN
  Filled 2022-12-04: qty 20, 10d supply, fill #0
  Filled 2022-12-15: qty 20, 10d supply, fill #1
  Filled 2022-12-25: qty 20, 10d supply, fill #2

## 2022-12-06 ENCOUNTER — Other Ambulatory Visit (HOSPITAL_COMMUNITY): Payer: Self-pay

## 2022-12-16 ENCOUNTER — Other Ambulatory Visit: Payer: Self-pay

## 2022-12-16 ENCOUNTER — Other Ambulatory Visit (HOSPITAL_COMMUNITY): Payer: Self-pay

## 2022-12-26 ENCOUNTER — Other Ambulatory Visit: Payer: Self-pay

## 2022-12-26 ENCOUNTER — Other Ambulatory Visit (HOSPITAL_COMMUNITY): Payer: Self-pay

## 2022-12-30 ENCOUNTER — Other Ambulatory Visit (HOSPITAL_COMMUNITY): Payer: Self-pay

## 2022-12-30 MED ORDER — MOUNJARO 10 MG/0.5ML ~~LOC~~ SOAJ
10.0000 mg | SUBCUTANEOUS | 3 refills | Status: DC
Start: 2022-12-30 — End: 2023-05-16
  Filled 2022-12-30: qty 2, 28d supply, fill #0

## 2023-01-10 ENCOUNTER — Other Ambulatory Visit: Payer: Self-pay

## 2023-01-10 ENCOUNTER — Other Ambulatory Visit (HOSPITAL_COMMUNITY): Payer: Self-pay

## 2023-01-10 MED ORDER — IBUPROFEN 800 MG PO TABS
800.0000 mg | ORAL_TABLET | Freq: Four times a day (QID) | ORAL | 0 refills | Status: DC
  Filled 2023-01-10: qty 18, 5d supply, fill #0

## 2023-01-10 MED ORDER — TRAMADOL HCL 50 MG PO TABS
50.0000 mg | ORAL_TABLET | Freq: Four times a day (QID) | ORAL | 0 refills | Status: DC | PRN
  Filled 2023-01-10: qty 10, 3d supply, fill #0

## 2023-01-12 ENCOUNTER — Other Ambulatory Visit (HOSPITAL_COMMUNITY): Payer: Self-pay

## 2023-01-12 MED ORDER — METHOCARBAMOL 750 MG PO TABS
750.0000 mg | ORAL_TABLET | Freq: Two times a day (BID) | ORAL | 1 refills | Status: DC | PRN
  Filled 2023-01-12: qty 20, 10d supply, fill #0
  Filled 2023-01-28: qty 20, 10d supply, fill #1

## 2023-01-13 ENCOUNTER — Other Ambulatory Visit (HOSPITAL_COMMUNITY): Payer: Self-pay

## 2023-01-31 ENCOUNTER — Other Ambulatory Visit (HOSPITAL_COMMUNITY): Payer: Self-pay

## 2023-01-31 ENCOUNTER — Other Ambulatory Visit: Payer: Self-pay

## 2023-01-31 MED ORDER — INDOMETHACIN 50 MG PO CAPS
50.0000 mg | ORAL_CAPSULE | Freq: Three times a day (TID) | ORAL | 0 refills | Status: DC
Start: 2023-01-31 — End: 2023-05-16
  Filled 2023-01-31: qty 21, 7d supply, fill #0

## 2023-01-31 MED ORDER — COLCHICINE 0.6 MG PO TABS
ORAL_TABLET | ORAL | 0 refills | Status: DC
  Filled 2023-01-31 (×2): qty 15, 7d supply, fill #0

## 2023-01-31 MED ORDER — MOUNJARO 12.5 MG/0.5ML ~~LOC~~ SOAJ
12.5000 mg | SUBCUTANEOUS | 3 refills | Status: DC
  Filled 2023-01-31: qty 6, 84d supply, fill #0
  Filled 2023-05-05: qty 6, 84d supply, fill #1

## 2023-02-14 ENCOUNTER — Other Ambulatory Visit (HOSPITAL_COMMUNITY): Payer: Self-pay

## 2023-02-17 ENCOUNTER — Other Ambulatory Visit (HOSPITAL_COMMUNITY): Payer: Self-pay

## 2023-02-17 MED ORDER — METHOCARBAMOL 750 MG PO TABS
750.0000 mg | ORAL_TABLET | Freq: Two times a day (BID) | ORAL | 1 refills | Status: DC | PRN
  Filled 2023-02-17: qty 20, 10d supply, fill #0
  Filled 2023-03-07: qty 20, 10d supply, fill #1

## 2023-03-29 ENCOUNTER — Other Ambulatory Visit (HOSPITAL_COMMUNITY): Payer: Self-pay

## 2023-03-31 ENCOUNTER — Other Ambulatory Visit (HOSPITAL_COMMUNITY): Payer: Self-pay

## 2023-03-31 MED ORDER — METHOCARBAMOL 750 MG PO TABS
750.0000 mg | ORAL_TABLET | Freq: Two times a day (BID) | ORAL | 1 refills | Status: DC | PRN
  Filled 2023-03-31: qty 20, 10d supply, fill #0
  Filled 2023-04-12: qty 20, 10d supply, fill #1

## 2023-04-11 ENCOUNTER — Other Ambulatory Visit (HOSPITAL_COMMUNITY): Payer: Self-pay

## 2023-05-10 ENCOUNTER — Other Ambulatory Visit (HOSPITAL_COMMUNITY): Payer: Self-pay

## 2023-05-12 ENCOUNTER — Other Ambulatory Visit (HOSPITAL_COMMUNITY): Payer: Self-pay

## 2023-05-12 MED ORDER — CLONIDINE HCL 0.1 MG PO TABS
0.1000 mg | ORAL_TABLET | Freq: Two times a day (BID) | ORAL | 11 refills | Status: AC
  Filled 2023-05-12: qty 60, 30d supply, fill #0
  Filled 2023-06-30: qty 60, 30d supply, fill #1
  Filled 2023-07-08 – 2023-08-04 (×2): qty 60, 30d supply, fill #2
  Filled 2023-09-04: qty 60, 30d supply, fill #3
  Filled 2023-10-07: qty 60, 30d supply, fill #4
  Filled 2023-10-31: qty 60, 30d supply, fill #5
  Filled 2023-12-01: qty 60, 30d supply, fill #6
  Filled 2024-01-05: qty 60, 30d supply, fill #7
  Filled 2024-02-02: qty 60, 30d supply, fill #8
  Filled 2024-03-08: qty 60, 30d supply, fill #9
  Filled 2024-03-31: qty 60, 30d supply, fill #10
  Filled 2024-04-08: qty 60, 30d supply, fill #11

## 2023-05-12 MED ORDER — METHOCARBAMOL 750 MG PO TABS
750.0000 mg | ORAL_TABLET | Freq: Two times a day (BID) | ORAL | 2 refills | Status: DC | PRN
  Filled 2023-05-12: qty 20, 10d supply, fill #0
  Filled 2023-06-09: qty 20, 10d supply, fill #1
  Filled 2023-07-08: qty 20, 10d supply, fill #2

## 2023-05-15 ENCOUNTER — Encounter (HOSPITAL_COMMUNITY): Payer: Self-pay | Admitting: Emergency Medicine

## 2023-05-15 ENCOUNTER — Observation Stay (HOSPITAL_COMMUNITY): Payer: 59

## 2023-05-15 ENCOUNTER — Emergency Department (HOSPITAL_COMMUNITY): Payer: 59

## 2023-05-15 ENCOUNTER — Other Ambulatory Visit: Payer: Self-pay

## 2023-05-15 ENCOUNTER — Inpatient Hospital Stay (HOSPITAL_COMMUNITY)
Admission: EM | Admit: 2023-05-15 | Discharge: 2023-05-18 | DRG: 871 | Disposition: A | Discharge status: Home Health | Payer: 59 | Attending: Internal Medicine | Admitting: Internal Medicine

## 2023-05-15 DIAGNOSIS — M109 Gout, unspecified: Secondary | ICD-10-CM | POA: Diagnosis present

## 2023-05-15 DIAGNOSIS — A4189 Other specified sepsis: Secondary | ICD-10-CM | POA: Diagnosis not present

## 2023-05-15 DIAGNOSIS — Z91012 Allergy to eggs: Secondary | ICD-10-CM

## 2023-05-15 DIAGNOSIS — Z7984 Long term (current) use of oral hypoglycemic drugs: Secondary | ICD-10-CM

## 2023-05-15 DIAGNOSIS — D631 Anemia in chronic kidney disease: Secondary | ICD-10-CM | POA: Diagnosis present

## 2023-05-15 DIAGNOSIS — K644 Residual hemorrhoidal skin tags: Secondary | ICD-10-CM | POA: Diagnosis present

## 2023-05-15 DIAGNOSIS — G8929 Other chronic pain: Secondary | ICD-10-CM | POA: Diagnosis present

## 2023-05-15 DIAGNOSIS — Z91030 Bee allergy status: Secondary | ICD-10-CM

## 2023-05-15 DIAGNOSIS — E236 Other disorders of pituitary gland: Secondary | ICD-10-CM | POA: Diagnosis present

## 2023-05-15 DIAGNOSIS — E1169 Type 2 diabetes mellitus with other specified complication: Secondary | ICD-10-CM | POA: Diagnosis present

## 2023-05-15 DIAGNOSIS — G4733 Obstructive sleep apnea (adult) (pediatric): Secondary | ICD-10-CM | POA: Diagnosis present

## 2023-05-15 DIAGNOSIS — R7401 Elevation of levels of liver transaminase levels: Secondary | ICD-10-CM | POA: Diagnosis present

## 2023-05-15 DIAGNOSIS — Z6841 Body Mass Index (BMI) 40.0 and over, adult: Secondary | ICD-10-CM

## 2023-05-15 DIAGNOSIS — R269 Unspecified abnormalities of gait and mobility: Secondary | ICD-10-CM

## 2023-05-15 DIAGNOSIS — N179 Acute kidney failure, unspecified: Secondary | ICD-10-CM | POA: Diagnosis present

## 2023-05-15 DIAGNOSIS — A419 Sepsis, unspecified organism: Secondary | ICD-10-CM | POA: Diagnosis not present

## 2023-05-15 DIAGNOSIS — Z79899 Other long term (current) drug therapy: Secondary | ICD-10-CM

## 2023-05-15 DIAGNOSIS — I69351 Hemiplegia and hemiparesis following cerebral infarction affecting right dominant side: Secondary | ICD-10-CM

## 2023-05-15 DIAGNOSIS — E669 Obesity, unspecified: Secondary | ICD-10-CM | POA: Diagnosis present

## 2023-05-15 DIAGNOSIS — Z860100 Personal history of colon polyps, unspecified: Secondary | ICD-10-CM

## 2023-05-15 DIAGNOSIS — Z8616 Personal history of COVID-19: Secondary | ICD-10-CM

## 2023-05-15 DIAGNOSIS — Z91041 Radiographic dye allergy status: Secondary | ICD-10-CM

## 2023-05-15 DIAGNOSIS — J101 Influenza due to other identified influenza virus with other respiratory manifestations: Secondary | ICD-10-CM | POA: Insufficient documentation

## 2023-05-15 DIAGNOSIS — K219 Gastro-esophageal reflux disease without esophagitis: Secondary | ICD-10-CM | POA: Diagnosis present

## 2023-05-15 DIAGNOSIS — Z7985 Long-term (current) use of injectable non-insulin antidiabetic drugs: Secondary | ICD-10-CM

## 2023-05-15 DIAGNOSIS — J9601 Acute respiratory failure with hypoxia: Secondary | ICD-10-CM | POA: Diagnosis present

## 2023-05-15 DIAGNOSIS — I2489 Other forms of acute ischemic heart disease: Secondary | ICD-10-CM | POA: Diagnosis present

## 2023-05-15 DIAGNOSIS — Z888 Allergy status to other drugs, medicaments and biological substances status: Secondary | ICD-10-CM

## 2023-05-15 DIAGNOSIS — R6520 Severe sepsis without septic shock: Principal | ICD-10-CM | POA: Diagnosis present

## 2023-05-15 DIAGNOSIS — E876 Hypokalemia: Secondary | ICD-10-CM | POA: Diagnosis present

## 2023-05-15 DIAGNOSIS — I69398 Other sequelae of cerebral infarction: Secondary | ICD-10-CM

## 2023-05-15 DIAGNOSIS — R32 Unspecified urinary incontinence: Secondary | ICD-10-CM | POA: Diagnosis present

## 2023-05-15 DIAGNOSIS — M25562 Pain in left knee: Secondary | ICD-10-CM | POA: Diagnosis present

## 2023-05-15 DIAGNOSIS — E1122 Type 2 diabetes mellitus with diabetic chronic kidney disease: Secondary | ICD-10-CM | POA: Diagnosis present

## 2023-05-15 DIAGNOSIS — R2689 Other abnormalities of gait and mobility: Secondary | ICD-10-CM | POA: Diagnosis present

## 2023-05-15 DIAGNOSIS — I130 Hypertensive heart and chronic kidney disease with heart failure and stage 1 through stage 4 chronic kidney disease, or unspecified chronic kidney disease: Secondary | ICD-10-CM | POA: Diagnosis present

## 2023-05-15 DIAGNOSIS — I69322 Dysarthria following cerebral infarction: Secondary | ICD-10-CM

## 2023-05-15 DIAGNOSIS — I5032 Chronic diastolic (congestive) heart failure: Secondary | ICD-10-CM | POA: Diagnosis present

## 2023-05-15 DIAGNOSIS — E785 Hyperlipidemia, unspecified: Secondary | ICD-10-CM | POA: Diagnosis present

## 2023-05-15 DIAGNOSIS — N1832 Chronic kidney disease, stage 3b: Secondary | ICD-10-CM | POA: Insufficient documentation

## 2023-05-15 LAB — LACTIC ACID, PLASMA: Lactic Acid, Venous: 1.1 mmol/L (ref 0.5–1.9)

## 2023-05-15 LAB — CBC
HCT: 32.6 % — ABNORMAL LOW (ref 36.0–46.0)
Hemoglobin: 9.8 g/dL — ABNORMAL LOW (ref 12.0–15.0)
MCH: 24.4 pg — ABNORMAL LOW (ref 26.0–34.0)
MCHC: 30.1 g/dL (ref 30.0–36.0)
MCV: 81.3 fL (ref 80.0–100.0)
Platelets: 197 10*3/uL (ref 150–400)
RBC: 4.01 MIL/uL (ref 3.87–5.11)
RDW: 14.3 % (ref 11.5–15.5)
WBC: 3.8 10*3/uL — ABNORMAL LOW (ref 4.0–10.5)
nRBC: 0 % (ref 0.0–0.2)

## 2023-05-15 LAB — GLUCOSE, CAPILLARY: Glucose-Capillary: 70 mg/dL (ref 70–99)

## 2023-05-15 LAB — CBC WITH DIFFERENTIAL/PLATELET
Abs Immature Granulocytes: 0.05 10*3/uL (ref 0.00–0.07)
Basophils Absolute: 0 10*3/uL (ref 0.0–0.1)
Basophils Relative: 1 %
Eosinophils Absolute: 0 10*3/uL (ref 0.0–0.5)
Eosinophils Relative: 0 %
HCT: 35.5 % — ABNORMAL LOW (ref 36.0–46.0)
Hemoglobin: 10.7 g/dL — ABNORMAL LOW (ref 12.0–15.0)
Immature Granulocytes: 1 %
Lymphocytes Relative: 52 %
Lymphs Abs: 1.9 10*3/uL (ref 0.7–4.0)
MCH: 24.6 pg — ABNORMAL LOW (ref 26.0–34.0)
MCHC: 30.1 g/dL (ref 30.0–36.0)
MCV: 81.6 fL (ref 80.0–100.0)
Monocytes Absolute: 0.3 10*3/uL (ref 0.1–1.0)
Monocytes Relative: 9 %
Neutro Abs: 1.3 10*3/uL — ABNORMAL LOW (ref 1.7–7.7)
Neutrophils Relative %: 37 %
Platelets: 202 10*3/uL (ref 150–400)
RBC: 4.35 MIL/uL (ref 3.87–5.11)
RDW: 14.1 % (ref 11.5–15.5)
WBC: 3.6 10*3/uL — ABNORMAL LOW (ref 4.0–10.5)
nRBC: 0 % (ref 0.0–0.2)

## 2023-05-15 LAB — TROPONIN I (HIGH SENSITIVITY)
Troponin I (High Sensitivity): 121 ng/L (ref <18)
Troponin I (High Sensitivity): 112 ng/L (ref <18)

## 2023-05-15 LAB — COMPREHENSIVE METABOLIC PANEL
ALT: 22 U/L (ref 0–44)
AST: 60 U/L — ABNORMAL HIGH (ref 15–41)
Albumin: 2.7 g/dL — ABNORMAL LOW (ref 3.5–5.0)
Alkaline Phosphatase: 39 U/L (ref 38–126)
Anion gap: 11 (ref 5–15)
BUN: 21 mg/dL (ref 8–23)
CO2: 28 mmol/L (ref 22–32)
Calcium: 7.9 mg/dL — ABNORMAL LOW (ref 8.9–10.3)
Chloride: 98 mmol/L (ref 98–111)
Creatinine, Ser: 1.78 mg/dL — ABNORMAL HIGH (ref 0.44–1.00)
GFR, Estimated: 32 mL/min — ABNORMAL LOW (ref >60)
Glucose, Bld: 99 mg/dL (ref 70–99)
Potassium: 2.7 mmol/L — CL (ref 3.5–5.1)
Sodium: 137 mmol/L (ref 135–145)
Total Bilirubin: 0.6 mg/dL (ref 0.0–1.2)
Total Protein: 7.5 g/dL (ref 6.5–8.1)

## 2023-05-15 LAB — RESP PANEL BY RT-PCR (RSV, FLU A&B, COVID)  RVPGX2
Influenza A by PCR: POSITIVE — AB
Influenza B by PCR: NEGATIVE
Resp Syncytial Virus by PCR: NEGATIVE
SARS Coronavirus 2 by RT PCR: NEGATIVE

## 2023-05-15 LAB — BRAIN NATRIURETIC PEPTIDE: B Natriuretic Peptide: 69.7 pg/mL (ref 0.0–100.0)

## 2023-05-15 LAB — CREATININE, SERUM
Creatinine, Ser: 1.63 mg/dL — ABNORMAL HIGH (ref 0.44–1.00)
GFR, Estimated: 35 mL/min — ABNORMAL LOW (ref >60)

## 2023-05-15 LAB — HIV ANTIBODY (ROUTINE TESTING W REFLEX): HIV Screen 4th Generation wRfx: NONREACTIVE

## 2023-05-15 MED ORDER — DM-GUAIFENESIN ER 30-600 MG PO TB12
1.0000 | ORAL_TABLET | Freq: Two times a day (BID) | ORAL | Status: DC
  Administered 2023-05-16 – 2023-05-18 (×6): 1 via ORAL
  Filled 2023-05-15 (×7): qty 1

## 2023-05-15 MED ORDER — ATORVASTATIN CALCIUM 10 MG PO TABS
20.0000 mg | ORAL_TABLET | Freq: Every day | ORAL | Status: DC
  Administered 2023-05-16 – 2023-05-18 (×3): 20 mg via ORAL
  Filled 2023-05-15 (×3): qty 2

## 2023-05-15 MED ORDER — OSELTAMIVIR PHOSPHATE 30 MG PO CAPS
30.0000 mg | ORAL_CAPSULE | Freq: Two times a day (BID) | ORAL | Status: DC
  Administered 2023-05-16 – 2023-05-18 (×5): 30 mg via ORAL
  Filled 2023-05-15 (×6): qty 1

## 2023-05-15 MED ORDER — FLUTICASONE PROPIONATE 50 MCG/ACT NA SUSP
2.0000 | Freq: Every day | NASAL | Status: DC
  Administered 2023-05-16 – 2023-05-18 (×3): 2 via NASAL
  Filled 2023-05-15: qty 16

## 2023-05-15 MED ORDER — LORATADINE 10 MG PO TABS
10.0000 mg | ORAL_TABLET | Freq: Every day | ORAL | Status: DC | PRN
  Administered 2023-05-16: 10 mg via ORAL
  Filled 2023-05-15: qty 1

## 2023-05-15 MED ORDER — PANTOPRAZOLE SODIUM 40 MG PO TBEC
40.0000 mg | DELAYED_RELEASE_TABLET | Freq: Every day | ORAL | Status: DC
  Administered 2023-05-16 – 2023-05-18 (×3): 40 mg via ORAL
  Filled 2023-05-15 (×3): qty 1

## 2023-05-15 MED ORDER — OSELTAMIVIR PHOSPHATE 75 MG PO CAPS
75.0000 mg | ORAL_CAPSULE | Freq: Once | ORAL | Status: AC
  Administered 2023-05-15: 75 mg via ORAL
  Filled 2023-05-15: qty 1

## 2023-05-15 MED ORDER — SODIUM CHLORIDE 0.9 % IV BOLUS (SEPSIS)
1000.0000 mL | Freq: Once | INTRAVENOUS | Status: AC
  Administered 2023-05-15: 1000 mL via INTRAVENOUS

## 2023-05-15 MED ORDER — IPRATROPIUM-ALBUTEROL 0.5-2.5 (3) MG/3ML IN SOLN: 3.0000 mL | Freq: Four times a day (QID) | RESPIRATORY_TRACT | Status: DC | PRN

## 2023-05-15 MED ORDER — INSULIN ASPART 100 UNIT/ML IJ SOLN: 0.0000 [IU] | Freq: Three times a day (TID) | INTRAMUSCULAR | Status: DC

## 2023-05-15 MED ORDER — METHOCARBAMOL 500 MG PO TABS
750.0000 mg | ORAL_TABLET | Freq: Two times a day (BID) | ORAL | Status: DC | PRN
  Administered 2023-05-16: 750 mg via ORAL
  Filled 2023-05-15: qty 2

## 2023-05-15 MED ORDER — PHENOL 1.4 % MT LIQD
1.0000 | OROMUCOSAL | Status: DC | PRN
  Filled 2023-05-15: qty 177

## 2023-05-15 MED ORDER — LACTATED RINGERS IV SOLN
INTRAVENOUS | Status: DC
  Administered 2023-05-15: 150 mL/h via INTRAVENOUS

## 2023-05-15 MED ORDER — IPRATROPIUM-ALBUTEROL 0.5-2.5 (3) MG/3ML IN SOLN
3.0000 mL | Freq: Four times a day (QID) | RESPIRATORY_TRACT | Status: AC
  Administered 2023-05-15 – 2023-05-16 (×3): 3 mL via RESPIRATORY_TRACT
  Filled 2023-05-15 (×5): qty 3

## 2023-05-15 MED ORDER — ACETAMINOPHEN 325 MG PO TABS: 650.0000 mg | ORAL_TABLET | Freq: Four times a day (QID) | ORAL | Status: DC | PRN

## 2023-05-15 MED ORDER — ACETAMINOPHEN 500 MG PO TABS
1000.0000 mg | ORAL_TABLET | Freq: Once | ORAL | Status: AC
  Administered 2023-05-15: 1000 mg via ORAL
  Filled 2023-05-15: qty 2

## 2023-05-15 MED ORDER — SODIUM CHLORIDE 0.9 % IV BOLUS
1000.0000 mL | Freq: Once | INTRAVENOUS | Status: AC
  Administered 2023-05-15: 1000 mL via INTRAVENOUS

## 2023-05-15 MED ORDER — MENTHOL 3 MG MT LOZG
1.0000 | LOZENGE | OROMUCOSAL | Status: DC | PRN
  Filled 2023-05-15: qty 9

## 2023-05-15 MED ORDER — POTASSIUM CHLORIDE CRYS ER 20 MEQ PO TBCR
40.0000 meq | EXTENDED_RELEASE_TABLET | Freq: Once | ORAL | Status: AC
  Administered 2023-05-16: 40 meq via ORAL
  Filled 2023-05-15 (×2): qty 2

## 2023-05-15 MED ORDER — POTASSIUM CHLORIDE CRYS ER 20 MEQ PO TBCR
40.0000 meq | EXTENDED_RELEASE_TABLET | Freq: Once | ORAL | Status: AC
  Administered 2023-05-15: 40 meq via ORAL
  Filled 2023-05-15: qty 2

## 2023-05-15 MED ORDER — ENOXAPARIN SODIUM 40 MG/0.4ML IJ SOSY
40.0000 mg | PREFILLED_SYRINGE | INTRAMUSCULAR | Status: DC
  Administered 2023-05-16 – 2023-05-17 (×2): 40 mg via SUBCUTANEOUS
  Filled 2023-05-15 (×2): qty 0.4

## 2023-05-15 MED ORDER — SODIUM CHLORIDE 0.9 % IV BOLUS
1000.0000 mL | Freq: Once | INTRAVENOUS | Status: AC
Start: 2023-05-15 — End: 2023-05-15
  Administered 2023-05-15: 1000 mL via INTRAVENOUS

## 2023-05-15 NOTE — Sepsis Progress Note (Signed)
Notified provider and bedside nurse of need to order antibiotics.  

## 2023-05-15 NOTE — ED Notes (Signed)
Placed on O2 for spo2 mataining 70% on room air

## 2023-05-15 NOTE — Hospital Course (Addendum)
#  Acute hypoxic respiratory failure #Influenza A sepsis Presented with fever, tachycardia, hypertension, and hypoxemia.  Tested positive for influenza A.  Given fluids with subsequent improvement in MAP.  Chest x-ray demonstrated no overt consolidation or interstitial disease.  Required supplemental oxygen throughout stay. Provided with supplemental oxygen for use after discharge and to be follow-up outpatient. She was started on Tamiflu.  Follow-up outpatient with new O2 requirements and completion of her Tamiflu course.  #Syncope #Mechanical fall Likely secondary from hypovolemia from influenza A and poor p.o. intake.  Orthostatics negative after fluids.  PT recommended home health PT.  Patient will also be discharged with rolling walker for improved safety.  #Elevated AST Likely secondary to acute viral illness.  Follow-up outpatient.

## 2023-05-15 NOTE — ED Provider Notes (Addendum)
[place] EMERGENCY DEPARTMENT AT [facility] Provider Note   CSN: [id number] Arrival date & time: 05/15/23  1511     History  Chief Complaint  Patient presents with   Dizziness    [name] is a 63 y.o. female.  HPI Patient with multiple medical issues including heart failure, kidney injury, hypertension presents with 3 days of flulike illness including episode of syncope versus near syncope yesterday.  Until about 3 days ago she was well, since that time she has had generalized discomfort, dyspnea, fatigue.  Yesterday she may have felt weak enough that she completely passed out versus came close to doing so.  Currently no chest pain, she continues to feel poorly all over.  She has been able to take her medication.    Home Medications Prior to Admission medications   Medication Sig Start Date End Date Taking? Authorizing Provider  Accu-Chek FastClix Lancets MISC Check blood sugar up to 3 times daily. E11.59 02/22/19   Hoy Register, MD  acetaminophen (TYLENOL) 325 MG tablet Take 2 tablets (650 mg total) by mouth every 6 (six) hours as needed for mild pain (or Fever >/= 101). 10/08/21   Arrien, York Ram, MD  AMBULATORY NON FORMULARY MEDICATION Incentive Spirometer , QTY 1- Use as directed. 01/02/22   Napoleon Form, MD  atorvastatin (LIPITOR) 20 MG tablet Take 1 tablet (20 mg total) by mouth daily. 08/22/21     Blood Glucose Monitoring Suppl (ACCU-CHEK GUIDE ME) w/Device KIT 1 kit by Does not apply route daily. Check blood sugar up to 3 times daily. E11.59 02/22/19   Hoy Register, MD  cloNIDine (CATAPRES) 0.1 MG tablet Take 1 tablet (0.1 mg total) by mouth 2 (two) times daily for hypertension. 05/12/23     colchicine 0.6 MG tablet Take 2 tabs (1.2 mg) at the first sign of flare up, followed by 1 tab (0.6 mg) after 1 hour. (MAX 3 tabs on day 1) then followed by 1 tab (0.6 mg) twice daily until 48 hours after flare resolves. 01/31/23     dapagliflozin  propanediol (FARXIGA) 10 MG TABS tablet Take 1 tablet (10 mg total) by mouth daily. 10/09/21   Arrien, York Ram, MD  diphenhydrAMINE (BENADRYL) 25 MG tablet Take 25 mg by mouth at bedtime as needed for itching or allergies.    [provider]  ergocalciferol (VITAMIN D2) 1.25 MG (50000 UT) capsule Take 1 capsule (50,000 Units total) by mouth once a week on Sundays for 12 weeks. 08/06/21     ferrous sulfate 325 (65 FE) MG tablet one tablet po daily . 10/09/21     fluticasone (FLONASE) 50 MCG/ACT nasal spray Place 2 sprays into both nostrils daily. 10/09/21     furosemide (LASIX) 20 MG tablet Take 1 tablet (20 mg total) by mouth daily. 10/26/21     glipiZIDE (GLUCOTROL) 5 MG tablet Take 0.5 tablets (2.5 mg total) by mouth 2 (two) times daily before a meal. Patient taking differently: Take 5 mg by mouth daily. 08/22/21     glucose blood (ACCU-CHEK GUIDE) test strip Check blood sugar up to 3 times daily. E11.59 02/22/19   Hoy Register, MD  ibuprofen (ADVIL) 800 MG tablet Take 1 tablet by mouth every six to eight hours as needed for pain 01/10/23     indomethacin (INDOCIN) 50 MG capsule Take 1 capsule (50 mg total) by mouth 3 (three) times daily until the gout attack resolves 01/31/23     loratadine (CLARITIN) 10 MG  tablet Take 10 mg by mouth daily as needed for allergies.    [provider]  meclizine (ANTIVERT) 25 MG tablet Take 25 mg by mouth 2 (two) times daily as needed for dizziness.    [provider]  meclizine (DRAMAMINE II) 25 MG tablet Take 1 tablet (25 mg total) by mouth 2 (two) times daily as needed for dizziness. 03/19/22     metFORMIN (GLUCOPHAGE-XR) 500 MG 24 hr tablet TAKE 2 TABLETS (1,000 MG TOTAL) BY MOUTH 2 (TWO) TIMES DAILY. 08/17/21 08/17/22  Hoy Register, MD  methocarbamol (ROBAXIN) 750 MG tablet Take 750 mg by mouth 2 (two) times daily as needed for muscle spasms.    [provider]  methocarbamol (ROBAXIN) 750 MG tablet Take 1 tablet (750 mg  total) by mouth 2 (two) times daily as needed. 05/12/23     metoprolol tartrate (LOPRESSOR) 50 MG tablet Take 1 tablet (50 mg total) by mouth 2 (two) times daily. 10/08/21 11/07/21  Arrien, York Ram, MD  omeprazole (PRILOSEC) 40 MG capsule Take 1 capsule (40 mg total) by mouth daily. 10/09/21     sacubitril-valsartan (ENTRESTO) 49-51 MG Take 1 tablet by mouth in the morning AND 1 tablet every evening. 10/19/21     sacubitril-valsartan (ENTRESTO) 49-51 MG Take 1 tablet by mouth 2 (two) times daily for high blood pressure 08/01/22     tirzepatide (MOUNJARO) 10 MG/0.5ML Pen Inject 10 mg into the skin once a week. 12/30/22     tirzepatide (MOUNJARO) 12.5 MG/0.5ML Pen Inject 12.5 mg into the skin once a week. 01/31/23     tirzepatide (MOUNJARO) 2.5 MG/0.5ML Pen Inject 2.5 mg into the skin once a week for 4 weeks, then increase to 5 mg weekly. 05/10/22     tirzepatide (MOUNJARO) 5 MG/0.5ML Pen Inject 5 mg into the skin once a week. 08/01/22     tiZANidine (ZANAFLEX) 4 MG tablet Take 1 tablet (4 mg total) by mouth every 6 (six) hours as needed for muscle spasms. 12/02/21   Al Decant, PA-C  traMADol (ULTRAM) 50 MG tablet Take 1 tablet (50 mg total) by mouth every 6 (six) hours as needed for pain 01/10/23         Allergies    Bee venom, Egg-derived products, Gabapentin, and Gadolinium derivatives    Review of Systems   Review of Systems  Physical Exam Updated Vital Signs BP (!) 89/53   Pulse 85   Temp (!) 100.5 F (38.1 C)   Resp 19   Ht 5' (1.524 m)   Wt 126.1 kg   LMP 02/17/2011 (Exact Date)   SpO2 100%   BMI 54.29 kg/m  Physical Exam Vitals and nursing note reviewed.  Constitutional:      General: She is not in acute distress.    Appearance: She is well-developed. She is obese.  HENT:     Head: Normocephalic and atraumatic.  Eyes:     Conjunctiva/sclera: Conjunctivae normal.  Cardiovascular:     Rate and Rhythm: Normal rate and regular rhythm.     Pulses: Normal pulses.   Pulmonary:     Effort: Pulmonary effort is normal. No respiratory distress.     Breath sounds: Normal breath sounds. No stridor.  Abdominal:     General: There is no distension.  Skin:    General: Skin is warm and dry.  Neurological:     Mental Status: She is alert and oriented to person, place, and time.     Cranial Nerves: No  cranial nerve deficit.  Psychiatric:        Mood and Affect: Mood normal.     ED Results / Procedures / Treatments   Labs (all labs ordered are listed, but only abnormal results are displayed) Labs Reviewed  RESP PANEL BY RT-PCR (RSV, FLU A&B, COVID)  RVPGX2 - Abnormal; Notable for the following components:      Result Value   Influenza A by PCR POSITIVE (*)    All other components within normal limits  COMPREHENSIVE METABOLIC PANEL - Abnormal; Notable for the following components:   Potassium 2.7 (*)    Creatinine, Ser 1.78 (*)    Calcium 7.9 (*)    Albumin 2.7 (*)    AST 60 (*)    GFR, Estimated 32 (*)    All other components within normal limits  CBC WITH DIFFERENTIAL/PLATELET - Abnormal; Notable for the following components:   WBC 3.6 (*)    Hemoglobin 10.7 (*)    HCT 35.5 (*)    MCH 24.6 (*)    Neutro Abs 1.3 (*)    All other components within normal limits  TROPONIN I (HIGH SENSITIVITY) - Abnormal; Notable for the following components:   Troponin I (High Sensitivity) 121 (*)    All other components within normal limits  CULTURE, BLOOD (SINGLE)  BRAIN NATRIURETIC PEPTIDE  URINALYSIS, ROUTINE W REFLEX MICROSCOPIC  LACTIC ACID, PLASMA  TROPONIN I (HIGH SENSITIVITY)    EKG EKG Interpretation Date/Time:  Thursday May 15 2023 15:15:43 EST Ventricular Rate:  83 PR Interval:  158 QRS Duration:  100 QT Interval:  371 QTC Calculation: 436 R Axis:   45  Text Interpretation: Sinus rhythm Low voltage, precordial leads Nonspecific T abnormalities, diffuse leads Confirmed by Gerhard Munch [phone]) on 05/15/2023 5:11:25  PM  Radiology DG Chest 2 View Result Date: 05/15/2023 CLINICAL DATA:  Dizziness.  Possible syncope. EXAM: CHEST - 2 VIEW COMPARISON:  12/01/2021 FINDINGS: Stable enlarged cardiac silhouette and prominent pulmonary vasculature. No airspace consolidation. Unremarkable bones with overlying bra artifacts noted. IMPRESSION: Cardiomegaly and pulmonary vascular congestion. Electronically Signed   By: Beckie Salts M.D.   On: 05/15/2023 15:51    Procedures Procedures    Medications Ordered in ED Medications  sodium chloride 0.9 % bolus 1,000 mL (has no administration in time range)  lactated ringers infusion (150 mL/hr Intravenous New Bag/Given 05/15/23 1730)  sodium chloride 0.9 % bolus 1,000 mL (1,000 mLs Intravenous New Bag/Given 05/15/23 1729)  sodium chloride 0.9 % bolus 1,000 mL (0 mLs Intravenous Stopped 05/15/23 1659)  acetaminophen (TYLENOL) tablet 1,000 mg (1,000 mg Oral Given 05/15/23 1732)  oseltamivir (TAMIFLU) capsule 75 mg (75 mg Oral Given 05/15/23 1732)    ED Course/ Medical Decision Making/ A&P                                 Medical Decision Making Obese adult female with multiple medical issues including heart failure, kidney injury, hypertension, diabetes and prior hypoxemic respiratory failure secondary to COVID presents with flulike illness.  Patient is awake and alert, but mildly hypotensive, tachypneic and slightly febrile on arrival all concerning for infectious pathology. Cardiac 95 sinus normal Pulse ox 92% on nasal cannula abnormal   Amount and/or Complexity of Data Reviewed Independent Historian: EMS External Data Reviewed: notes. Labs: ordered. Decision-making details documented in ED Course. Radiology: ordered and independent interpretation performed. Decision-making details documented in ED Course. ECG/medicine tests: ordered and  independent interpretation performed. Decision-making details documented in ED Course.  Risk OTC drugs. Prescription drug  management. Decision regarding hospitalization. Diagnosis or treatment significantly limited by social determinants of health.   5:56 PM Blood pressure now 110 systolic, patient mentating appropriately, but continues to require 2 L via nasal cannula for appropriate saturation.  Patient meets sepsis criteria, is influenza positive, has received fluids, Tamiflu, will require admission for further monitoring, management.  Patient has mild worsening of renal function, mild hypokalemia, this will be repleted orally.  No EKG evidence of arrhythmia, patient has no ongoing chest pain, and although she had elevated troponin, the second value was downtrending, consistent with metabolic demand.  She is mentating appropriately.  CRITICAL CARE Performed by: Gerhard Munch Total critical care time: 35 minutes Critical care time was exclusive of separately billable procedures and treating other patients. Critical care was necessary to treat or prevent imminent or life-threatening deterioration. Critical care was time spent personally by me on the following activities: development of treatment plan with patient and/or surrogate as well as nursing, discussions with consultants, evaluation of patient's response to treatment, examination of patient, obtaining history from patient or surrogate, ordering and performing treatments and interventions, ordering and review of laboratory studies, ordering and review of radiographic studies, pulse oximetry and re-evaluation of patient's condition.   Final Clinical Impression(s) / ED Diagnoses Final diagnoses:  Severe sepsis [facility])     Gerhard Munch, MD 05/15/23 [phone]

## 2023-05-15 NOTE — ED Notes (Signed)
Critical lab results reported to provider.

## 2023-05-15 NOTE — Sepsis Progress Note (Signed)
 Elink will follow per sepsis protocol.

## 2023-05-15 NOTE — ED Triage Notes (Signed)
From home, started to have some dizziness last night and has not resolved

## 2023-05-15 NOTE — H&P (Cosign Needed Addendum)
Date: 05/15/2023               Patient Name:  [name] MRN: [id number]  DOB: [birth year] Age / Sex: 63 y.o., female   PCP: System, Provider Not In         Medical Service: Internal Medicine Teaching Service         Attending Physician: Dr. Mercie Eon, MD      First Contact: Dr. Morrie Sheldon, MD Pager [phone]    Second Contact: Dr. Rudene Christians, DO Pager [phone]         After Hours (After 5p/  First Contact Pager: [phone]  weekends / holidays): Second Contact Pager: [phone]   SUBJECTIVE   Chief Complaint: Shortness of breath, weakness  History of Present Illness:   [name] is a 63 yo F with PMH of HFpEF, hemorrhagic stroke, T2DM, Gout, HTN, HLT who presented to [facility] hospital with dyspnea and admitted for Acute Hypoxic Respiratory Failure in setting of Flu A illness.  She was in her usual state of health until 2 days ago when she had myalgias, increased cough without much mucus or phlegm, and severe fatigue. She came to hospital today due to progressive fatigue and new shortness of breath. She has sick contacts in her family who had URI symptoms but have since recovered. She has also one several episodes of loose stools for each of the past two days, noticing bright red blood in her diarrhea yesterday and none today (last bowel movement was this AM). Now, her dyspnea is improved with oxygen running but she still feels weak, chills, congestion, and intermittent nausea but no emesis. She reports reduced intake of food but feels that she has kept up with fluid intake. She has not missed any meidicne.  Additionally, she reports that she fell from her kitchen chair yesterday evening and is not sure if she passed out. She recalls being in the chair on her phone and the next thing she knew she was on the floor. She believes she hit her head on the floor and notes a headache. Her fall was not observed but her husband quickly came to the room. She was on the floor for about 20  minutes until her son arrived to help get her up. She has acute L knee pain on top of chronic L knee pain that she attributes to her fall. She can put weight on her leg but has only walked a few steps. She denies palpitations, vasovagal symptoms, chest pain, or concern of seizure. She does note that she had seizures when she had CVA in 2017 but has had none since and stopped her Keppra due to concern that it was causing weight gain.  At her bseline she does not use oxygen. She has a cPAP but does not use it because it interferes with her sleep.  Last time patient saw PCP in 02/2023  ED Course: Presented to [facility] ED with complaint of dyspnea and dizziness. Required 3L oxygen after sats 70 on RA. BP 89/53 with fever 100.5 and code sepsis activated. 3L fluid resuscitation. Found to be Flu A+. K low at 2.7. Cr up at 1.78, unclear base. No leukocytosis. Troponins 121 -> 112 without EKG changes. CXR vascular congestion no infiltrate. Given dose of tamiflu. Paged for admission.   Medical history HFpEF last ECHO 2023 with EF 60% CVA 2017: residual vertigo, mild dysarthria, and R sided weakness Gout - foot, no recent flares HTN T2DM no insulin HLD Internal Hemorrhoids  Meds Tylenol PRN  Methocarbamol 750 mg BID Lipitor 20 mg daily Loratadine 10 mg daily Meclizine 25 mg BID PRN Entresto 49-51 mg BID Farxiga 10 mg daily Furosemide 20 mg daiy Metoprolol 50 BID Clonidine 0.1 mg Metformin 1000 mf daily Glipizide 5 mg daily Mounjaro - sundary 12.5 mg/weekly Flonase 50 mcg/ BID PRN Prilosec nightly 40mg    Not taking: Meclizine dramamine or antivert since 2023 Iron because stools are black - 1 month Tramadol 50 mg - not taking? Vitamin D   Past Medical History  Past Surgical History:  Procedure Laterality Date   CARDIAC CATHETERIZATION  04/2010   Dr. Sharyn Lull   COLONOSCOPY WITH PROPOFOL N/A 04/06/2019   Procedure: COLONOSCOPY WITH PROPOFOL;  Surgeon: Napoleon Form, MD;  Location: [facility]  ENDOSCOPY;  Service: Endoscopy;  Laterality: N/A;   POLYPECTOMY  04/06/2019   Procedure: POLYPECTOMY;  Surgeon: Napoleon Form, MD;  Location: [facility] ENDOSCOPY;  Service: Endoscopy;;    Social:  Lives With: husband and brother in [place], [place] Occupation: currently on disability. Used to be Home health aide Support: family members Level of Function: patient uses a cane for mobility. Patient does not cook. Otherwise, she is indepented in ADLS/IADLs PCP: Dr. Manson Passey at [facility] Substances: - Denies any smoking, alcohol, drugs   Family History: NA  Allergies: Allergies as of 05/15/2023 - Review Complete 05/15/2023  Allergen Reaction Noted   Bee venom Swelling 04/21/2017   Egg-derived products Nausea And Vomiting and Swelling 11/29/2015   Gabapentin Other (See Comments) 09/10/2016   Gadolinium derivatives Itching 12/12/2015    Review of Systems: A complete ROS was negative except as per HPI.   OBJECTIVE:   Physical Exam: Blood pressure 94/73, pulse 84, temperature (!) 100.5 F (38.1 C), resp. rate 19, height 5' (1.524 m), weight 126.1 kg, last menstrual period 02/17/2011, SpO2 100%.  Constitutional: ill-appearing woman in bed in no acute distress. She is obese. She is wearing oxygen [place]. HENT: normocephalic atraumatic, dry mucous membranes  Cardiovascular: regular rate and rhythm, no m/r/g, Unable to assess JVD Pulmonary/Chest: normal work of breathing on room air. Bilateral wheeze worse on L. Trace rales bilateral lung bases.  Abdominal: soft, non-tender, non-distended. Neurological: alert & oriented x 3 , face symmetric, no CN deficits, mild dysarthria and R weakness pt reports is typical for her. MSK: no gross abnormalities.  Enlarged lower extremity, symmetric, without pitting edema. Mild general tenderness to the L knee and L ankle, no apparent swelling. No tenderness or swelling to the L hip.  Skin: warm and dry Psych: Normal mood and affect  Labs: CBC    Component  Value Date/Time   WBC 3.8 (L) 05/15/2023 2137   RBC 4.01 05/15/2023 2137   HGB 9.8 (L) 05/15/2023 2137   HGB 11.5 09/10/2016 1109   HCT 32.6 (L) 05/15/2023 2137   HCT 36.4 09/10/2016 1109   PLT 197 05/15/2023 2137   PLT 341 09/10/2016 1109   MCV 81.3 05/15/2023 2137   MCV 77 (L) 09/10/2016 1109   MCH 24.4 (L) 05/15/2023 2137   MCHC 30.1 05/15/2023 2137   RDW 14.3 05/15/2023 2137   RDW 15.5 (H) 09/10/2016 1109   LYMPHSABS 1.9 05/15/2023 1531   LYMPHSABS 3.0 09/10/2016 1109   MONOABS 0.3 05/15/2023 1531   EOSABS 0.0 05/15/2023 1531   EOSABS 0.2 09/10/2016 1109   BASOSABS 0.0 05/15/2023 1531   BASOSABS 0.0 09/10/2016 1109     CMP     Component Value Date/Time   NA 137  05/15/2023 1531   NA 143 06/14/2020 0911   K 2.7 (LL) 05/15/2023 1531   CL 98 05/15/2023 1531   CO2 28 05/15/2023 1531   GLUCOSE 99 05/15/2023 1531   BUN 21 05/15/2023 1531   BUN 15 06/14/2020 0911   CREATININE 1.63 (H) 05/15/2023 2137   CREATININE 0.86 02/20/2016 0953   CALCIUM 7.9 (L) 05/15/2023 1531   PROT 7.5 05/15/2023 1531   PROT 8.0 10/23/2018 0936   ALBUMIN 2.7 (L) 05/15/2023 1531   ALBUMIN 3.7 (L) 10/23/2018 0936   AST 60 (H) 05/15/2023 1531   ALT 22 05/15/2023 1531   ALKPHOS 39 05/15/2023 1531   BILITOT 0.6 05/15/2023 1531   BILITOT 0.4 10/23/2018 0936   GFRNONAA 35 (L) 05/15/2023 2137   GFRNONAA 76 02/20/2016 0953   GFRAA >60 11/16/2019 0415   GFRAA 88 02/20/2016 0953   RVP + Flu A Troponin 121 -> 112 BNP 69 Lactic Acid 1.1  Imaging:  CXR - Stable enlarged cardiac silhouette and prominent pulmonary vasculature. No airspace consolidation. Unremarkable bones with overlying bra artifacts noted. IMPRESSION: Cardiomegaly and pulmonary vascular congestion.  EKG: personally reviewed my interpretation is sinus rhythm with no changes from prior EKG 2023  ASSESSMENT & PLAN:   Assessment & Plan by Problem: Principal Problem:   Acute hypoxic respiratory failure (HCC) Active Problems:    Hypokalemia   Type 2 diabetes mellitus with hyperlipidemia (HCC)   Obstructive sleep apnea   Empty sella (HCC)   Gait disturbance, post-stroke   Chronic diastolic heart failure (HCC)   Influenza A   Doris Gruhn is a 63 yo F with PMH of HFpEF, hemorrhagic stroke, T2DM, Gout, HTN, HLT who presented to [facility] hospital with dyspnea and admitted for Acute Hypoxic Respiratory Failure in setting of Flu A illness.  Acute Hypoxic respiratory failure Influenza A sepsis Presented with fever, tachycardia, hypotension, hypoxemia and AKI. Influenza A on admission. S/p 3L NaCl with improvemeent in MAP. Currently on supplemental oxygen 3L [place] and improving. CXR without overt consolidation or Insterstitial disease, however,vascular congestion perhaps suggesting mild HF exacerbation. No elevation in BNP or signs of peripheral congestion otherwise. Last TTE in 2023 with LVEF of 60-65% and grade II diastolic dysfunction without valvular abnormalities. Given unknown history, will repeat TTE during this admission for further evaluation and therapeutic guidance. - CrCl 40; Oseltalmivir 75 mg once in the ED then 30 mg  BID for 9 doses - Duonebs, scheduled q6 HR for 24 hrs, then PRN - Flonase BID - Therapeutic O2, wean as tolerated - Tylenol PRN for fever and pain  Syncope Mechanical fall Impaired mobility Likely in the setting of hypovolemia/ orthostasis in the setting of influenza A infection and low po intake. Low suspicion for cardiac or neuro based on presentation. Last TTE in 2023 without valvular abnormalities. - Telemetry - Orthostatic vital signs, ordered - TTE per above - Follow up CT head without contrast - PT and OT, consulted  Elevated troponin 121 -> 112 without ACS changes on EKG. Denies chest pain at this time. Likely in the setting of demand ischemia due to sepsis, respiratory distress vs possible heart failure exacerbation. - Repeat troponin and EKG if chest pain  AKI vs CKD Cr. 1.7 and  GFR 32 on admission. Significant decrease from one year ago ( 1.16 and 54, respectively). 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 Will work up as new AKI at this time. No lower urinary tract  symptoms. Patient is incontinent at baseline and uses adult briefs. - Follow up Bladder ultrasound - Follow up Renal ultrasound - Follow up U/A  Hypokalemia 2.7 on admission. Likely in the setting on Furosemide, GI distress, and low po intake. - Supplemented x2 40 mEq - CTM  Elevated AST AST 60 on admission.  - Will recheck in the AM s/p 3L IVF - If persistently elevated, consider RUQ abdominal ultrasound  HFpEF HTN HLD Last TTE in 2023 per above. However, patient is now on GDMT. No records from [facility]. Overall, seems euvolemic peripherally, except for rales on presentation. -  Follow up TTE in the AM -  If improvement in Cr, may benefit from one dose of lasix -  Hold Entresto, BB, Farxiga, and furosemide in setting of hypotension -  Continue Lipitor 40 mg daily  Chronic normocytic anemia Hx internal hemorrhoids Hgb 10.7 on admission. No RDW elevation. Previously on Fe supplementation, stopped ~1 month ago for GI distress. Last colonoscpy in 2021 with polyp in the cecum, which was removed, and internal hemorrhoids - CTM during admission - Deferred rectal exam during this evaluation; consider performing if continued bleeding - Colonoscopy in 2026 -2031  Diabetes Mellitus On oral diabetic regimen - SSI with meals - Holding oral medications in setting of low PO intake and GI distress  Hx of ICH in 2017 Hx of Seizures Mild dysarthria and R>L sided weakness. Experienced seizures at the time but none since. Was on Keppra but stopped due to concern of weight gain.  OSA Intermittent use of cPAP at night - cPAP at night during this admission  Gout No active disease  GERD - continue home PPI     Diet:  Carb-Modified VTE: Enoxaparin Code: Full; would like her two daughters to make their decision  Prior to Admission Living Arrangement: Home, living family Anticipated Discharge Location: Home, pending PT/OT Barriers to Discharge: clinical improvement  Dispo: Admit patient to Observation with expected length of stay less than 2 midnights.  Signed:  Katheran James, DO Internal Medicine Resident PGY-1  05/15/2023, 10:17 PM   Please contact the on call pager after 5 pm and on weekends at [phone]. If no response after 15 minutes contact [phone].

## 2023-05-15 NOTE — ED Notes (Signed)
 Ultrasound at bedside

## 2023-05-16 ENCOUNTER — Observation Stay (HOSPITAL_COMMUNITY): Payer: 59

## 2023-05-16 DIAGNOSIS — E1169 Type 2 diabetes mellitus with other specified complication: Secondary | ICD-10-CM | POA: Diagnosis present

## 2023-05-16 DIAGNOSIS — Z6841 Body Mass Index (BMI) 40.0 and over, adult: Secondary | ICD-10-CM | POA: Diagnosis not present

## 2023-05-16 DIAGNOSIS — K219 Gastro-esophageal reflux disease without esophagitis: Secondary | ICD-10-CM | POA: Diagnosis present

## 2023-05-16 DIAGNOSIS — I5032 Chronic diastolic (congestive) heart failure: Secondary | ICD-10-CM | POA: Diagnosis present

## 2023-05-16 DIAGNOSIS — E669 Obesity, unspecified: Secondary | ICD-10-CM | POA: Diagnosis present

## 2023-05-16 DIAGNOSIS — E876 Hypokalemia: Secondary | ICD-10-CM | POA: Diagnosis present

## 2023-05-16 DIAGNOSIS — J101 Influenza due to other identified influenza virus with other respiratory manifestations: Secondary | ICD-10-CM

## 2023-05-16 DIAGNOSIS — J9601 Acute respiratory failure with hypoxia: Secondary | ICD-10-CM | POA: Diagnosis present

## 2023-05-16 DIAGNOSIS — E785 Hyperlipidemia, unspecified: Secondary | ICD-10-CM | POA: Diagnosis present

## 2023-05-16 DIAGNOSIS — M109 Gout, unspecified: Secondary | ICD-10-CM | POA: Diagnosis present

## 2023-05-16 DIAGNOSIS — R6520 Severe sepsis without septic shock: Secondary | ICD-10-CM | POA: Diagnosis present

## 2023-05-16 DIAGNOSIS — G8929 Other chronic pain: Secondary | ICD-10-CM | POA: Diagnosis present

## 2023-05-16 DIAGNOSIS — M25562 Pain in left knee: Secondary | ICD-10-CM | POA: Diagnosis present

## 2023-05-16 DIAGNOSIS — R2689 Other abnormalities of gait and mobility: Secondary | ICD-10-CM | POA: Diagnosis present

## 2023-05-16 DIAGNOSIS — D631 Anemia in chronic kidney disease: Secondary | ICD-10-CM | POA: Diagnosis present

## 2023-05-16 DIAGNOSIS — A4189 Other specified sepsis: Secondary | ICD-10-CM | POA: Diagnosis present

## 2023-05-16 DIAGNOSIS — N1832 Chronic kidney disease, stage 3b: Secondary | ICD-10-CM | POA: Diagnosis present

## 2023-05-16 DIAGNOSIS — I69351 Hemiplegia and hemiparesis following cerebral infarction affecting right dominant side: Secondary | ICD-10-CM | POA: Diagnosis not present

## 2023-05-16 DIAGNOSIS — E1122 Type 2 diabetes mellitus with diabetic chronic kidney disease: Secondary | ICD-10-CM | POA: Diagnosis present

## 2023-05-16 DIAGNOSIS — A419 Sepsis, unspecified organism: Secondary | ICD-10-CM | POA: Diagnosis present

## 2023-05-16 DIAGNOSIS — I2489 Other forms of acute ischemic heart disease: Secondary | ICD-10-CM | POA: Diagnosis present

## 2023-05-16 DIAGNOSIS — Z8616 Personal history of COVID-19: Secondary | ICD-10-CM | POA: Diagnosis not present

## 2023-05-16 DIAGNOSIS — N179 Acute kidney failure, unspecified: Secondary | ICD-10-CM

## 2023-05-16 DIAGNOSIS — G4733 Obstructive sleep apnea (adult) (pediatric): Secondary | ICD-10-CM | POA: Diagnosis present

## 2023-05-16 DIAGNOSIS — I130 Hypertensive heart and chronic kidney disease with heart failure and stage 1 through stage 4 chronic kidney disease, or unspecified chronic kidney disease: Secondary | ICD-10-CM | POA: Diagnosis present

## 2023-05-16 LAB — ECHOCARDIOGRAM COMPLETE
Area-P 1/2: 4.28 cm2
Calc EF: 60.4 %
Height: 60 in
S' Lateral: 2.6 cm
Single Plane A2C EF: 65.7 %
Single Plane A4C EF: 55.4 %
Weight: 4448 [oz_av]

## 2023-05-16 LAB — CBC
HCT: 34.9 % — ABNORMAL LOW (ref 36.0–46.0)
Hemoglobin: 10.4 g/dL — ABNORMAL LOW (ref 12.0–15.0)
MCH: 24 pg — ABNORMAL LOW (ref 26.0–34.0)
MCHC: 29.8 g/dL — ABNORMAL LOW (ref 30.0–36.0)
MCV: 80.4 fL (ref 80.0–100.0)
Platelets: 187 10*3/uL (ref 150–400)
RBC: 4.34 MIL/uL (ref 3.87–5.11)
RDW: 14.4 % (ref 11.5–15.5)
WBC: 4.4 10*3/uL (ref 4.0–10.5)
nRBC: 0 % (ref 0.0–0.2)

## 2023-05-16 LAB — COMPREHENSIVE METABOLIC PANEL
ALT: 21 U/L (ref 0–44)
AST: 56 U/L — ABNORMAL HIGH (ref 15–41)
Albumin: 2.5 g/dL — ABNORMAL LOW (ref 3.5–5.0)
Alkaline Phosphatase: 43 U/L (ref 38–126)
Anion gap: 12 (ref 5–15)
BUN: 15 mg/dL (ref 8–23)
CO2: 25 mmol/L (ref 22–32)
Calcium: 7.7 mg/dL — ABNORMAL LOW (ref 8.9–10.3)
Chloride: 105 mmol/L (ref 98–111)
Creatinine, Ser: 1.41 mg/dL — ABNORMAL HIGH (ref 0.44–1.00)
GFR, Estimated: 42 mL/min — ABNORMAL LOW (ref >60)
Glucose, Bld: 92 mg/dL (ref 70–99)
Potassium: 3.3 mmol/L — ABNORMAL LOW (ref 3.5–5.1)
Sodium: 142 mmol/L (ref 135–145)
Total Bilirubin: 0.6 mg/dL (ref 0.0–1.2)
Total Protein: 7 g/dL (ref 6.5–8.1)

## 2023-05-16 LAB — GLUCOSE, CAPILLARY
Glucose-Capillary: 117 mg/dL — ABNORMAL HIGH (ref 70–99)
Glucose-Capillary: 135 mg/dL — ABNORMAL HIGH (ref 70–99)
Glucose-Capillary: 88 mg/dL (ref 70–99)
Glucose-Capillary: 102 mg/dL — ABNORMAL HIGH (ref 70–99)

## 2023-05-16 LAB — BLOOD CULTURE ID PANEL (REFLEXED) - BCID2

## 2023-05-16 LAB — POTASSIUM: Potassium: 4.3 mmol/L (ref 3.5–5.1)

## 2023-05-16 LAB — HEMOGLOBIN A1C
Hgb A1c MFr Bld: 5.5 % (ref 4.8–5.6)
Mean Plasma Glucose: 111.15 mg/dL

## 2023-05-16 LAB — MAGNESIUM: Magnesium: 1.4 mg/dL — ABNORMAL LOW (ref 1.7–2.4)

## 2023-05-16 MED ORDER — POTASSIUM CHLORIDE CRYS ER 10 MEQ PO TBCR
40.0000 meq | EXTENDED_RELEASE_TABLET | Freq: Once | ORAL | Status: AC
  Filled 2023-05-16: qty 4

## 2023-05-16 MED ORDER — POTASSIUM CHLORIDE 20 MEQ PO PACK
40.0000 meq | PACK | Freq: Once | ORAL | Status: AC
  Administered 2023-05-16: 40 meq via ORAL
  Filled 2023-05-16: qty 2

## 2023-05-16 MED ORDER — INSULIN ASPART 100 UNIT/ML IJ SOLN: 0.0000 [IU] | Freq: Every day | INTRAMUSCULAR | Status: DC

## 2023-05-16 MED ORDER — PERFLUTREN LIPID MICROSPHERE
1.0000 mL | INTRAVENOUS | Status: AC | PRN
  Administered 2023-05-16: 2 mL via INTRAVENOUS

## 2023-05-16 MED ORDER — POTASSIUM CHLORIDE CRYS ER 20 MEQ PO TBCR
40.0000 meq | EXTENDED_RELEASE_TABLET | Freq: Once | ORAL | Status: DC
  Filled 2023-05-16: qty 2

## 2023-05-16 MED ORDER — INSULIN ASPART 100 UNIT/ML IJ SOLN
0.0000 [IU] | Freq: Three times a day (TID) | INTRAMUSCULAR | Status: DC
  Administered 2023-05-16 – 2023-05-17 (×2): 3 [IU] via SUBCUTANEOUS

## 2023-05-16 MED ORDER — MAGNESIUM SULFATE 2 GM/50ML IV SOLN
2.0000 g | Freq: Once | INTRAVENOUS | Status: AC
  Administered 2023-05-16: 2 g via INTRAVENOUS
  Filled 2023-05-16: qty 50

## 2023-05-16 NOTE — Evaluation (Signed)
Physical Therapy Evaluation Patient Details Name: [name] MRN: [id number] DOB: [birth year] Today's Date: 05/16/2023  History of Present Illness  Patient is a 63 yo female presenting to the ED with weakness, fatigue, and SOB on 05/15/23. Admitted with flu A. Patient fell on 2/19 at home and did hit her head, CT clear, MRI pending. PMH of HFpEF, hemorrhagic stroke, T2DM, Gout, HTN, HLT.    Clinical Impression  Pt in bed upon arrival of PT, agreeable to evaluation at this time. Prior to admission the pt was ambulating without DME, but reports recent decline in activity tolerance since her fall on 2/19. The pt reports she lives with her spouse and brother and has some assist available from spouse. She also has a variety of DME available, but could benefit from rollator for improved safety and place for seated rest due to current deficits in endurance. The pt was able to complete bed mobility with min-modA but only needed CGA for remainder of mobility. She was limited to short distance (67ft) of ambulation this session due to SOB and fatigue, will benefit from continued skilled PT acutely and after d/c to facilitate return to prior level of independence.   SpO2 to low of 87% on RA with exertion, 96% on RA at rest    If plan is discharge home, recommend the following: A little help with walking and/or transfers;A little help with bathing/dressing/bathroom;Assistance with cooking/housework;Assist for transportation;Help with stairs or ramp for entrance   Can travel by private vehicle        Equipment Recommendations  (trial rollator)  Recommendations for Other Services       Functional Status Assessment Patient has had a recent decline in their functional status and demonstrates the ability to make significant improvements in function in a reasonable and predictable amount of time.     Precautions / Restrictions Precautions Precautions: Fall Recall of Precautions/Restrictions:  Intact Precaution/Restrictions Comments: watch O2 Restrictions Weight Bearing Restrictions Per Provider Order: No      Mobility  Bed Mobility Overal bed mobility: Needs Assistance Bed Mobility: Rolling, Sidelying to Sit Rolling: Min assist Sidelying to sit: Mod assist       General bed mobility comments: cues to use hand rails and assist  using bed pad to advance fully into sitting, mild instability when in sitting, however feet did not touch the floor    Transfers Overall transfer level: Needs assistance Equipment used: Rolling walker (2 wheels) Transfers: Sit to/from Stand, Bed to chair/wheelchair/BSC Sit to Stand: Min assist, Contact guard assist   Step pivot transfers: Contact guard assist       General transfer comment: initially minA, completed with CGA by end of session. cues for hand placement on armrests    Ambulation/Gait Ambulation/Gait assistance: Contact guard assist Gait Distance (Feet): 6 Feet Assistive device: Rolling walker (2 wheels) Gait Pattern/deviations: Step-through pattern, Decreased stride length, Trunk flexed Gait velocity: decreased Gait velocity interpretation: <1.31 ft/sec, indicative of household ambulator   General Gait Details: decreased stride length, slow, guarded steps, no overt LOB or knee buckling but dependent on RW. increased work of breathing      Balance Overall balance assessment: Needs assistance Sitting-balance support: No upper extremity supported, Feet unsupported Sitting balance-Leahy Scale: Fair Sitting balance - Comments: feet did not reach floor initially   Standing balance support: Bilateral upper extremity supported, During functional activity, Reliant on assistive device for balance Standing balance-Leahy Scale: Poor Standing balance comment: reliant on RW  Pertinent Vitals/Pain Pain Assessment Pain Assessment: Faces Faces Pain Scale: Hurts a little bit Pain Location:  L knee Pain Descriptors / Indicators: Grimacing, Discomfort, Guarding Pain Intervention(s): Limited activity within patient's tolerance, Monitored during session, Repositioned    Home Living Family/patient expects to be discharged to:: Private residence Living Arrangements: Spouse/significant other;Other relatives (brother and husband) Available Help at Discharge: Family;Available 24 hours/day Type of Home: House Home Access: Stairs to enter Entrance Stairs-Rails: Right;Left;Can reach both Entrance Stairs-Number of Steps: 5   Home Layout: One level Home Equipment: Tub bench;Rolling Walker (2 wheels);BSC/3in1;Hand held shower head Additional Comments: pt assists her brother who has seizures he can do simple ADLs, she helps with medicine    Prior Function Prior Level of Function : Needs assist;Driving;History of Falls (last six months)             Mobility Comments: no other falls, no use of DME, unable to stand for more than 5 min, limited household ambulator, no DME ADLs Comments: pt does seated food prep, spouse helps with LB dressing     Extremity/Trunk Assessment   Upper Extremity Assessment Upper Extremity Assessment: Defer to OT evaluation;Generalized weakness RUE Deficits / Details: numbness and tingling in the hand, sensation the same when comparing to the L RUE Sensation: WNL    Lower Extremity Assessment Lower Extremity Assessment: Generalized weakness RLE Deficits / Details: decreased sensation in R upper quad when compared to L    Cervical / Trunk Assessment Cervical / Trunk Assessment: Other exceptions Cervical / Trunk Exceptions: increased body habitus  Communication   Communication Communication: Impaired Factors Affecting Communication: Difficulty expressing self    Cognition Arousal: Alert Behavior During Therapy: Anxious (minimally)   PT - Cognitive impairments: Initiation, Problem solving                         Following commands:  Intact       Cueing Cueing Techniques: Verbal cues, Gestural cues, Tactile cues     General Comments General comments (skin integrity, edema, etc.): very motivated, SOB with any exertion but recovers well with seated rest        Assessment/Plan    PT Assessment Patient needs continued PT services  PT Problem List Cardiopulmonary status limiting activity;Decreased strength;Decreased activity tolerance;Decreased balance;Decreased mobility       PT Treatment Interventions DME instruction;Gait training;Stair training;Functional mobility training;Therapeutic activities;Therapeutic exercise;Balance training;Patient/family education    PT Goals (Current goals can be found in the Care Plan section)  Acute Rehab PT Goals Patient Stated Goal: return home and be able to assist her brother PT Goal Formulation: With patient Time For Goal Achievement: 05/30/23 Potential to Achieve Goals: Good    Frequency Min 1X/week     Co-evaluation   Reason for Co-Treatment: Complexity of the patient's impairments (multi-system involvement);For patient/therapist safety;To address functional/ADL transfers PT goals addressed during session: Mobility/safety with mobility;Proper use of DME;Strengthening/ROM;Balance OT goals addressed during session: ADL's and self-care;Proper use of Adaptive equipment and DME       AM-PAC PT "6 Clicks" Mobility  Outcome Measure Help needed turning from your back to your side while in a flat bed without using bedrails?: A Little Help needed moving from lying on your back to sitting on the side of a flat bed without using bedrails?: A Little Help needed moving to and from a bed to a chair (including a wheelchair)?: A Little Help needed standing up from a chair using your arms (e.g., wheelchair or  bedside chair)?: A Little Help needed to walk in hospital room?: Total (<20 ft) Help needed climbing 3-5 steps with a railing? : A Lot 6 Click Score: 15    End of Session  Equipment Utilized During Treatment: Gait belt;Oxygen Activity Tolerance: Patient tolerated treatment well;Patient limited by fatigue Patient left: in chair;with call bell/phone within reach;with chair alarm set;with nursing/sitter in room Nurse Communication: Mobility status PT Visit Diagnosis: Unsteadiness on feet (R26.81);Other abnormalities of gait and mobility (R26.89);Muscle weakness (generalized) (M62.81)    Time: 2841-3244 PT Time Calculation (min) (ACUTE ONLY): 41 min   Charges:   PT Evaluation $PT Eval Moderate Complexity: 1 Mod   PT General Charges $$ ACUTE PT VISIT: 1 Visit         Vickki Muff, PT, DPT   Acute Rehabilitation Department Office [phone] Secure Chat Communication Preferred  Ronnie Derby 05/16/2023, 2:11 PM

## 2023-05-16 NOTE — Evaluation (Signed)
Occupational Therapy Evaluation Patient Details Name: [name] MRN: [id number] DOB: [birth year] Today's Date: 05/16/2023   History of Present Illness   Patient is a 63 yo female presenting to the ED with weakness, fatigue, and SOB on 05/15/23. Admitted with flu A. Patient fell on 2/19 at home and did hit her head, CT clear, MRI pending. PMH of HFpEF, hemorrhagic stroke, T2DM, Gout, HTN, HLT     Clinical Impressions Prior to this admission, patient living with her spouse and her brother (brother has epilepsy and needs assist with upper level cognitive tasks but not physical assist) and ambulating without DME, but only ambulates short distances and then needs to sit down. Patient's husband helps her with socks and shoes. Currently, patient between room air and 1L of oxygen with movement (increased WOB noted) and need for mod A for bed mobility, mod A for ADLs and CGA for transfers but limited to short distances. Patient also noting she is having expressive language difficulties, R hand numbness and tingling, tremors, and decreased sensation in RLE. OT recommending HHOT at discharge. OT will continue to follow acutely.      If plan is discharge home, recommend the following:   A little help with walking and/or transfers;A lot of help with bathing/dressing/bathroom;Assistance with cooking/housework;Direct supervision/assist for medications management;Direct supervision/assist for financial management;Help with stairs or ramp for entrance;Assist for transportation (initially)     Functional Status Assessment   Patient has had a recent decline in their functional status and demonstrates the ability to make significant improvements in function in a reasonable and predictable amount of time.     Equipment Recommendations   None recommended by OT     Recommendations for Other Services         Precautions/Restrictions   Precautions Precautions: Fall Recall of  Precautions/Restrictions: Intact Precaution/Restrictions Comments: watch O2 Restrictions Weight Bearing Restrictions Per Provider Order: No     Mobility Bed Mobility Overal bed mobility: Needs Assistance Bed Mobility: Rolling, Sidelying to Sit Rolling: Min assist Sidelying to sit: Mod assist       General bed mobility comments: cues to use hand rails and OT using bed pad to advance fully into sitting, mild instability when in sitting, however feet did not touch the floor    Transfers Overall transfer level: Needs assistance Equipment used: Rolling walker (2 wheels) Transfers: Sit to/from Stand, Bed to chair/wheelchair/BSC Sit to Stand: Contact guard assist Stand pivot transfers: Contact guard assist         General transfer comment: CGA to stand pivot to [facility], then ambulated to recliner (less than 10 feet) with CGA      Balance Overall balance assessment: Needs assistance Sitting-balance support: No upper extremity supported, Feet unsupported Sitting balance-Leahy Scale: Fair Sitting balance - Comments: feet did not reach floor initially   Standing balance support: Bilateral upper extremity supported, During functional activity, Reliant on assistive device for balance Standing balance-Leahy Scale: Poor Standing balance comment: reliant on RW                           ADL either performed or assessed with clinical judgement   ADL Overall ADL's : Needs assistance/impaired Eating/Feeding: Set up;Sitting   Grooming: Set up;Sitting   Upper Body Bathing: Contact guard assist;Sitting   Lower Body Bathing: Maximal assistance;Total assistance;Sit to/from stand;Sitting/lateral leans   Upper Body Dressing : Contact guard assist;Sitting   Lower Body Dressing: Maximal assistance;Total assistance;Sit to/from stand;Sitting/lateral leans  Toilet Transfer: Contact guard assist;Stand-pivot;BSC/3in1   Psychiatrist and Hygiene: Total  assistance;Sitting/lateral lean;Sit to/from stand Toileting - Clothing Manipulation Details (indicate cue type and reason): unable to complete peri-care     Functional mobility during ADLs: Moderate assistance;Cueing for sequencing;Cueing for safety;Rolling walker (2 wheels) General ADL Comments: Prior to this admission, patient living with her spouse and her brother (brother has epilepsy and needs assist with upper level cognitive tasks but not physical assist) and ambulating without DME, but only ambulates short distances and then needs to sit down. Patient's husband helps her with socks and shoes. Currently, patient between room air and 1L of oxygen with movement (increased WOB noted) and need for mod A for bed mobility, mod A for ADLs and CGA for transfers but limited to short distances. Patient also noting she is having expressive language difficulties, R hand numbness and tingling, tremors, and decreased sensation in RLE. OT recommending HHOT at discharge. OT will continue to follow acutely.     Vision Baseline Vision/History: 1 Wears glasses Ability to See in Adequate Light: 0 Adequate Patient Visual Report: No change from baseline Vision Assessment?: No apparent visual deficits     Perception Perception: Not tested       Praxis Praxis: Not tested       Pertinent Vitals/Pain Pain Assessment Pain Assessment: Faces Faces Pain Scale: Hurts a little bit Pain Location: L knee Pain Descriptors / Indicators: Grimacing, Discomfort, Guarding Pain Intervention(s): Limited activity within patient's tolerance, Monitored during session, Repositioned     Extremity/Trunk Assessment Upper Extremity Assessment Upper Extremity Assessment: Generalized weakness;Right hand dominant;RUE deficits/detail RUE Deficits / Details: numbness and tingling in the hand, sensation the same when comparing to the L RUE Sensation: WNL   Lower Extremity Assessment Lower Extremity Assessment: Defer to PT  evaluation;RLE deficits/detail RLE Deficits / Details: decreased sensation in R upper quad when compared to L   Cervical / Trunk Assessment Cervical / Trunk Assessment: Other exceptions Cervical / Trunk Exceptions: increased body habitus   Communication Communication Communication: Impaired Factors Affecting Communication: Difficulty expressing self   Cognition Arousal: Alert Behavior During Therapy: Anxious (minimally) Cognition: Cognition impaired     Awareness: Intellectual awareness intact, Online awareness intact Memory impairment (select all impairments): Short-term memory Attention impairment (select first level of impairment): Selective attention Executive functioning impairment (select all impairments): Sequencing, Reasoning, Problem solving OT - Cognition Comments: Slowed responses throughout all session, states that this sometimes happens post stroke but defintely has worsened this hospital stay per patient report                 Following commands: Intact       Cueing  General Comments   Cueing Techniques: Verbal cues;Gestural cues;Tactile cues      Exercises     Shoulder Instructions      Home Living Family/patient expects to be discharged to:: Private residence Living Arrangements: Spouse/significant other;Other relatives (brother and husband) Available Help at Discharge: Family;Available 24 hours/day Type of Home: House Home Access: Stairs to enter [employer] of Steps: 5 Entrance Stairs-Rails: Right;Left;Can reach both Home Layout: One level     Bathroom Shower/Tub: IT trainer: Standard Bathroom Accessibility: No   Home Equipment: Tub bench;Rolling Walker (2 wheels);BSC/3in1;Hand held shower head   Additional Comments: pt assists her brother who has seizures he can do simple ADLs, she helps with medicine      Prior Functioning/Environment Prior Level of Function : Needs assist;Driving;History  of Falls (last  six months)             Mobility Comments: no other falls, no use of DME, unable to stand for more than 5 min, limited household ambulator, no DME ADLs Comments: pt does seated food prep, spouse helps with LB dressing    OT Problem List: Decreased range of motion;Decreased strength;Decreased activity tolerance;Impaired balance (sitting and/or standing);Decreased coordination;Decreased cognition;Decreased safety awareness;Decreased knowledge of use of DME or AE;Decreased knowledge of precautions;Cardiopulmonary status limiting activity;Impaired sensation;Obesity;Pain;Increased edema   OT Treatment/Interventions: Self-care/ADL training;Therapeutic exercise;Energy conservation;Neuromuscular education;DME and/or AE instruction;Manual therapy;Therapeutic activities;Cognitive remediation/compensation;Patient/family education;Balance training      OT Goals(Current goals can be found in the care plan section)   Acute Rehab OT Goals Patient Stated Goal: to get better with my balance OT Goal Formulation: With patient Time For Goal Achievement: 05/30/23 Potential to Achieve Goals: Good   OT Frequency:  Min 1X/week    Co-evaluation   Reason for Co-Treatment: Complexity of the patient's impairments (multi-system involvement);For patient/therapist safety;To address functional/ADL transfers PT goals addressed during session: Mobility/safety with mobility;Proper use of DME;Strengthening/ROM;Balance OT goals addressed during session: ADL's and self-care;Proper use of Adaptive equipment and DME      AM-PAC OT "6 Clicks" Daily Activity     Outcome Measure Help from another person eating meals?: A Little Help from another person taking care of personal grooming?: A Little Help from another person toileting, which includes using toliet, bedpan, or urinal?: A Lot Help from another person bathing (including washing, rinsing, drying)?: A Lot Help from another person to put on and  taking off regular upper body clothing?: A Little Help from another person to put on and taking off regular lower body clothing?: A Lot 6 Click Score: 15   End of Session Equipment Utilized During Treatment: Gait belt;Rolling walker (2 wheels);Oxygen Nurse Communication: Mobility status  Activity Tolerance: Patient limited by fatigue Patient left: in chair;with call bell/phone within reach;with chair alarm set;with nursing/sitter in room  OT Visit Diagnosis: Unsteadiness on feet (R26.81);Other abnormalities of gait and mobility (R26.89);Muscle weakness (generalized) (M62.81);History of falling (Z91.81);Other symptoms and signs involving cognitive function;Pain Pain - Right/Left: Left Pain - part of body: Knee                Time: 4098-1191 OT Time Calculation (min): 38 min Charges:  OT General Charges $OT Visit: 1 Visit OT Evaluation $OT Eval Moderate Complexity: 1 Mod OT Treatments $Self Care/Home Management : 8-22 mins  Pollyann Glen E. Ecko Beasley, OTR/L Acute Rehabilitation Services [phone]   Cherlyn Cushing 05/16/2023, 1:08 PM

## 2023-05-16 NOTE — Progress Notes (Signed)
  Echocardiogram 2D Echocardiogram has been performed.  [name] 05/16/2023, 9:58 AM

## 2023-05-16 NOTE — Progress Notes (Addendum)
Subjective:  Overnight Events: Admitted, no acute events overnight  Interval: Patient says that her knee pain is better, but she feels short of breath with a cough. She has had a headache since her fall. She has passed some stool when coughing. The patient has not had dark stools in the past month and only had one isolated episode of bright red blood per rectum a couple of days ago. She says that she occasionally has bright red blood per rectum due to hemorrhoids. Everyone in the patient's is sick, but they have recovered. The patient ambulates with a cane at baseline with no recent changes. She denies vision changes.  Objective:  Vital signs in last 24 hours: Vitals:   05/15/23 2055 05/15/23 2130 05/15/23 2322 05/16/23 0808  BP:   129/65 123/65  Pulse:  83 81 97  Resp:  20 20   Temp: 98.9 F (37.2 C)  99.2 F (37.3 C)   TempSrc: Oral  Oral   SpO2:  100% 96% 94%  Weight:      Height:       Weight change:   Intake/Output Summary (Last 24 hours) at 05/16/2023 0837 Last data filed at 05/15/2023 2330 Gross per 24 hour  Intake 3540 ml  Output --  Net 3540 ml   Physical Exam HENT:     Head: Normocephalic and atraumatic.     Nose:     Comments: Nasal cannula in place Cardiovascular:     Rate and Rhythm: Normal rate and regular rhythm.     Pulses:          Popliteal pulses are 2+ on the right side and 2+ on the left side.       Dorsalis pedis pulses are 2+ on the right side and 2+ on the left side.     Heart sounds: Normal heart sounds.  Pulmonary:     Comments: Decreased breath sounds, worse at the bases. Wheezing bilaterally present on inspiration and expiration Musculoskeletal:     Right lower leg: No edema.     Left lower leg: No edema.  Neurological:     Mental Status: She is alert.     Comments: Dysarthric at baseline, able to form words but speech is slow     Assessment/Plan:  Principal Problem:   Acute hypoxic respiratory failure (HCC) Active Problems:    Hypokalemia   Type 2 diabetes mellitus with hyperlipidemia (HCC)   Obstructive sleep apnea   Empty sella (HCC)   Gait disturbance, post-stroke   Chronic diastolic heart failure (HCC)   Influenza A   CKD stage 3b, GFR 30-44 ml/min (HCC)   Keayra Graham is a 63 yo F with PMH of HFpEF (60-65% 2023), hemorrhagic CVA (2017), T2DM, Gout, HTN, HLD, OSA not using CPAP who presented to [facility] hospital with dyspnea and admitted for Acute Hypoxic Respiratory Failure in setting of Flu A illness.   Acute Hypoxic respiratory failure Influenza A sepsis Presented with fever, tachycardia, hypotension, hypoxemia and AKI. Influenza A on admission. S/p 3L NaCl with improvement in MAP. Currently on supplemental oxygen 3L [place] and improving. CXR shows cardiomegaly, vascular congestion perhaps suggesting mild HF exacerbation. No elevation in BNP or signs of peripheral congestion otherwise. TTEcho this morning shows EF 65-70% w/ LV grade 1 diastolic dysfunction, some improvement from 2023. - Oseltalmivir 30 mg BID for 9 doses - Duonebs, scheduled q6 HR then PRN tomorrow - Flonase BID - Therapeutic O2, wean as tolerated - Tylenol PRN for fever  and pain   Syncope Mechanical fall Impaired mobility Uses a cane at baseline. CT Head and L Knee XR negative. Likely in the setting of hypovolemia/ orthostasis in the setting of influenza A infection and low po intake. Low suspicion for cardiac or neuro based on presentation. Last TTE in 2023 without valvular abnormalities. - Orthostatics ordered - PT eval ordered   Elevated troponin 121 -> 112 without ACS changes on EKG. Denies chest pain at this time. Likely in the setting of demand ischemia due to sepsis, respiratory distress vs possible heart failure exacerbation. - Repeat troponin and EKG if chest pain   AKI vs CKD Cr. 1.7 and GFR 32 on admission improved to 1.41 this morning. No lower urinary tract symptoms. Patient is incontinent at baseline and uses adult  briefs. RBUS negative. Likely pre-renal secondary to poor intake.   Hypokalemia 2.7 on admission, 3.3 this morning. Likely in the setting on Furosemide, GI distress, and low po intake. - Replete 40 mEq, recheck if here tomorrow  Elevated AST AST 60 on admission, ALT 22. Likely in the setting of acute viral illness. Will leave to her outpatient doctor to evaluate.   HFpEF HTN HLD TTEcho this morning shows EF 65-70% w/ LV grade 1 diastolic dysfunction, some improvement from 2023. Patient is on GDMT per home regimen. No records from [facility]. Overall, seems euvolemic peripherally. Hypotensive on admission, normotensive at this time. -  Hold [place], BB, Farxiga, and furosemide in setting of hypotension -  Continue Lipitor 40 mg daily   Chronic normocytic anemia Hx internal hemorrhoids Hgb 10.7 on admission. No RDW elevation. Previously on Fe supplementation, stopped ~1 month ago for GI distress. Last colonoscopy in 2021 with polyp in the cecum, which was removed, and internal hemorrhoids - Colonoscopy in 2026 - 2031   Diabetes Mellitus On oral diabetic regimen - SSI with meals - Holding oral medications in setting of low PO intake and GI distress   Hx of ICH in 2017 Hx of Seizures Mild dysarthria and R>L sided weakness. Experienced seizures at the time but none since. Was on Keppra but stopped due to concern of weight gain.   OSA Intermittent use of cPAP at night - cPAP at night during this admission   Gout No active disease   GERD - continue home PPI   Diet: Carb-Modified VTE: Enoxaparin Code: Full; would like her two daughters to make their decision   Prior to Admission Living Arrangement: Home, living family Anticipated Discharge Location: Home, pending PT/OT Barriers to Discharge: clinical improvement   LOS: 0 days   Kayleen Memos, Medical Student 05/16/2023, 8:37 AM  Attestation for Student Documentation:  I personally was present and performed or  re-performed the history, physical exam and medical decision-making activities of this service and have verified that the service and findings are accurately documented in the student's note.  Morrie Sheldon, MD 05/16/2023, 12:40 PM

## 2023-05-16 NOTE — Progress Notes (Signed)
PHARMACY - PHYSICIAN COMMUNICATION CRITICAL VALUE ALERT - BLOOD CULTURE IDENTIFICATION (BCID)  [name] is an 63 y.o. female who presented to [facility] on 05/15/2023 with a chief complaint of respiratory failure in the setting of Influenza A.   Assessment:  1/2 Blood Cultures + Staphylococcus epidermidis   Name of physician (or Provider) Contacted: Morrie Sheldon, MD   Current antibiotics: n/a, on Tamiflu for Flu A   Changes to prescribed antibiotics recommended:  No antibiotics at this time, likely contaminant   No results found for this or any previous visit.  Cedric Fishman 05/16/2023  4:59 PM

## 2023-05-17 DIAGNOSIS — J9601 Acute respiratory failure with hypoxia: Secondary | ICD-10-CM | POA: Diagnosis not present

## 2023-05-17 LAB — BASIC METABOLIC PANEL
Anion gap: 9 (ref 5–15)
BUN: 13 mg/dL (ref 8–23)
CO2: 21 mmol/L — ABNORMAL LOW (ref 22–32)
Calcium: 8 mg/dL — ABNORMAL LOW (ref 8.9–10.3)
Chloride: 107 mmol/L (ref 98–111)
Creatinine, Ser: 1.17 mg/dL — ABNORMAL HIGH (ref 0.44–1.00)
GFR, Estimated: 53 mL/min — ABNORMAL LOW (ref >60)
Glucose, Bld: 122 mg/dL — ABNORMAL HIGH (ref 70–99)
Potassium: 4.1 mmol/L (ref 3.5–5.1)
Sodium: 137 mmol/L (ref 135–145)

## 2023-05-17 LAB — CBC
HCT: 38.2 % (ref 36.0–46.0)
Hemoglobin: 11.4 g/dL — ABNORMAL LOW (ref 12.0–15.0)
MCH: 24.5 pg — ABNORMAL LOW (ref 26.0–34.0)
MCHC: 29.8 g/dL — ABNORMAL LOW (ref 30.0–36.0)
MCV: 82 fL (ref 80.0–100.0)
Platelets: 177 10*3/uL (ref 150–400)
RBC: 4.66 MIL/uL (ref 3.87–5.11)
RDW: 14.7 % (ref 11.5–15.5)
WBC: 5.2 10*3/uL (ref 4.0–10.5)
nRBC: 0 % (ref 0.0–0.2)

## 2023-05-17 LAB — GLUCOSE, CAPILLARY
Glucose-Capillary: 94 mg/dL (ref 70–99)
Glucose-Capillary: 101 mg/dL — ABNORMAL HIGH (ref 70–99)
Glucose-Capillary: 174 mg/dL — ABNORMAL HIGH (ref 70–99)
Glucose-Capillary: 108 mg/dL — ABNORMAL HIGH (ref 70–99)

## 2023-05-17 LAB — CULTURE, BLOOD (SINGLE): Special Requests: ADEQUATE

## 2023-05-17 MED ORDER — ENOXAPARIN SODIUM 60 MG/0.6ML IJ SOSY
60.0000 mg | PREFILLED_SYRINGE | INTRAMUSCULAR | Status: DC
  Administered 2023-05-18: 60 mg via SUBCUTANEOUS
  Filled 2023-05-17: qty 0.6

## 2023-05-17 MED ORDER — PHENYLEPHRINE-MINERAL OIL-PET 0.25-14-74.9 % RE OINT: 1.0000 | TOPICAL_OINTMENT | Freq: Two times a day (BID) | RECTAL | Status: DC | PRN

## 2023-05-17 NOTE — Progress Notes (Signed)
 SATURATION QUALIFICATIONS: (This note is used to comply with regulatory documentation for home oxygen)  Patient Saturations on Room Air at Rest = 90%  Patient Saturations on Room Air while Ambulating = 85%  Patient Saturations on 2 Liters of oxygen while Ambulating = 91%  Please briefly explain why patient needs home oxygen: Patient short of breath with activity and drop saturations to 85, came up to 91% with 2L O2

## 2023-05-17 NOTE — Progress Notes (Signed)
 HD#1 Subjective:  Patient Summary: [name] is a 63 y.o. with a pertinent PMH of HFpEF, hemorrhagic stroke, T2DM, gout who presented with dyspnea and admitted on 02/20 for acute hypoxic respiratory failure from flu a on HD#1.   Overnight Events: febrile to 100.87F  Patient seen at bedside. She feels weak this morning when she was moving around to go to the bathroom. She does feel dizzy with standing today. In terms of going home, she does feel that she has good support at home with her husband and brother.   She desaturated to mid 80s with ambulatory saturations. Orthostatics were negative. RN concerned with safety when patient was moving. I have reached out to PT/ OT to see if they could re evaluate her today.  Pt is updated on the plan for today, and all questions and concerns are addressed.   Objective:  Vital signs in last 24 hours: Vitals:   05/16/23 2240 05/16/23 2244 05/16/23 2318 05/17/23 0419  BP: 123/77  (!) 163/89 117/69  Pulse: (!) 103 (!) 102 68 (!) 102  Resp: 18 16 16 20   Temp: (!) 100.8 F (38.2 C)  98.3 F (36.8 C) 100.2 F (37.9 C)  TempSrc: Oral  Oral Oral  SpO2: 95% 94% 99% 98%  Weight:      Height:       Supplemental O2: Nasal Cannula SpO2: 98 % O2 Flow Rate (L/min): 2 L/min   Physical Exam:  Constitutional: appears fatigued, alert and oriented x3 Cardiovascular: regular rate and rhythm, no m/r/g Pulmonary/Chest: normal work of breathing on room air, lungs clear to auscultation bilaterally MSK: no lower extremity edema Skin: warm and dry  Filed Weights   05/15/23 1522 05/16/23 0900  Weight: 126.1 kg 120.6 kg     Intake/Output Summary (Last 24 hours) at 05/17/2023 0939 Last data filed at 05/17/2023 0630 Gross per 24 hour  Intake 120 ml  Output 400 ml  Net -280 ml   Net IO Since Admission: 3,260 mL [05/17/23 0939]  Pertinent Labs:    Latest Ref Rng & Units 05/16/2023    5:29 AM 05/15/2023    9:37 PM 05/15/2023    3:31 PM  CBC  WBC  4.0 - 10.5 K/uL 4.4  3.8  3.6   Hemoglobin 12.0 - 15.0 g/dL 16.1  9.8  09.6   Hematocrit 36.0 - 46.0 % 34.9  32.6  35.5   Platelets 150 - 400 K/uL 187  197  202        Latest Ref Rng & Units 05/16/2023   12:55 PM 05/16/2023    5:29 AM 05/15/2023    9:37 PM  CMP  Glucose 70 - 99 mg/dL  92    BUN 8 - 23 mg/dL  15    Creatinine 0.45 - 1.00 mg/dL  4.09  8.11   Sodium 914 - 145 mmol/L  142    Potassium 3.5 - 5.1 mmol/L 4.3  3.3    Chloride 98 - 111 mmol/L  105    CO2 22 - 32 mmol/L  25    Calcium 8.9 - 10.3 mg/dL  7.7    Total Protein 6.5 - 8.1 g/dL  7.0    Total Bilirubin 0.0 - 1.2 mg/dL  0.6    Alkaline Phos 38 - 126 U/L  43    AST 15 - 41 U/L  56    ALT 0 - 44 U/L  21      Imaging: ECHOCARDIOGRAM COMPLETE Result Date:  05/16/2023    ECHOCARDIOGRAM REPORT   Patient Name:   [name] Bills Date of Exam: 05/16/2023 Medical Rec #:  [id number]      Height:       60.0 in Accession #:    [id number]     Weight:       278.0 lb Date of Birth:  [birth year]      BSA:          2.147 m Patient Age:    62 years       BP:           123/65 mmHg Patient Gender: F              HR:           90 bpm. Exam Location:  Inpatient Procedure: 2D Echo, Cardiac Doppler, Color Doppler and Intracardiac            Opacification Agent (Both Spectral and Color Flow Doppler were            utilized during procedure). Indications:    I50.40* Unspecified combined systolic (congestive) and diastolic                 (congestive) heart failure; R06.02 SOB  History:        Patient has prior history of Echocardiogram examinations, most                 recent 10/05/2021. CHF; Risk Factors:Diabetes, Sleep Apnea and                 Hypertension. FLU positive. ICH.  Sonographer:    Sheralyn Boatman RDCS Referring Phys: [id number] [facility]  Sonographer Comments: Technically difficult study due to poor echo windows and patient is obese. Image acquisition challenging due to patient body habitus. IMPRESSIONS  1. Left ventricular ejection fraction, by  estimation, is 65 to 70%. The left ventricle has normal function. The left ventricle has no regional wall motion abnormalities. Left ventricular diastolic parameters are consistent with Grade I diastolic dysfunction (impaired relaxation).  2. Right ventricular systolic function is normal. The right ventricular size is normal.  3. Left atrial size was mildly dilated.  4. The mitral valve is normal in structure. Trivial mitral valve regurgitation. No evidence of mitral stenosis.  5. The aortic valve is tricuspid. There is mild calcification of the aortic valve. Aortic valve regurgitation is not visualized. No aortic stenosis is present.  6. The inferior vena cava is normal in size with greater than 50% respiratory variability, suggesting right atrial pressure of 3 mmHg. Conclusion(s)/Recommendation(s): Limited images due to poor sound wave transmission. FINDINGS  Left Ventricle: Left ventricular ejection fraction, by estimation, is 65 to 70%. The left ventricle has normal function. The left ventricle has no regional wall motion abnormalities. Definity contrast agent was given IV to delineate the left ventricular  endocardial borders. Strain imaging was not performed. The left ventricular internal cavity size was normal in size. There is no left ventricular hypertrophy. Left ventricular diastolic parameters are consistent with Grade I diastolic dysfunction (impaired relaxation). Right Ventricle: The right ventricular size is normal. No increase in right ventricular wall thickness. Right ventricular systolic function is normal. Left Atrium: Left atrial size was mildly dilated. Right Atrium: Right atrial size was normal in size. Pericardium: There is no evidence of pericardial effusion. Mitral Valve: The mitral valve is normal in structure. Trivial mitral valve regurgitation. No evidence of mitral valve stenosis. Tricuspid Valve: The tricuspid valve is normal in  structure. Tricuspid valve regurgitation is trivial. No  evidence of tricuspid stenosis. Aortic Valve: The aortic valve is tricuspid. There is mild calcification of the aortic valve. Aortic valve regurgitation is not visualized. No aortic stenosis is present. Pulmonic Valve: The pulmonic valve was normal in structure. Pulmonic valve regurgitation is not visualized. No evidence of pulmonic stenosis. Aorta: The aortic root is normal in size and structure. Venous: The inferior vena cava is normal in size with greater than 50% respiratory variability, suggesting right atrial pressure of 3 mmHg. IAS/Shunts: No atrial level shunt detected by color flow Doppler. Additional Comments: 3D imaging was not performed.  LEFT VENTRICLE PLAX 2D LVIDd:         5.00 cm     Diastology LVIDs:         2.60 cm     LV e' medial:    7.30 cm/s LV PW:         1.20 cm     LV E/e' medial:  11.5 LV IVS:        1.00 cm     LV e' lateral:   5.28 cm/s LVOT diam:     2.20 cm     LV E/e' lateral: 15.8 LV SV:         61 LV SV Index:   28 LVOT Area:     3.80 cm  LV Volumes (MOD) LV vol d, MOD A2C: 97.1 ml LV vol d, MOD A4C: 97.7 ml LV vol s, MOD A2C: 33.3 ml LV vol s, MOD A4C: 43.6 ml LV SV MOD A2C:     63.8 ml LV SV MOD A4C:     97.7 ml LV SV MOD BP:      59.2 ml RIGHT VENTRICLE             IVC RV S prime:     12.70 cm/s  IVC diam: 1.00 cm TAPSE (M-mode): 1.5 cm LEFT ATRIUM             Index        RIGHT ATRIUM           Index LA diam:        4.40 cm 2.05 cm/m   RA Area:     13.50 cm LA Vol (A2C):   42.3 ml 19.70 ml/m  RA Volume:   26.80 ml  12.48 ml/m LA Vol (A4C):   34.2 ml 15.93 ml/m LA Biplane Vol: 39.6 ml 18.44 ml/m  AORTIC VALVE LVOT Vmax:   108.00 cm/s LVOT Vmean:  66.100 cm/s LVOT VTI:    0.160 m  AORTA Ao Asc diam: 2.90 cm MITRAL VALVE MV Area (PHT): 4.28 cm    SHUNTS MV Decel Time: 177 msec    Systemic VTI:  0.16 m MV E velocity: 83.64 cm/s  Systemic Diam: 2.20 cm MV A velocity: 78.58 cm/s MV E/A ratio:  1.06 Arvilla Meres MD Electronically signed by Arvilla Meres MD Signature  Date/Time: 05/16/2023/10:06:13 AM    Final     Assessment/Plan:   Principal Problem:   Acute hypoxic respiratory failure (HCC) Active Problems:   Hypokalemia   Type 2 diabetes mellitus with hyperlipidemia (HCC)   Obstructive sleep apnea   Empty sella (HCC)   Gait disturbance, post-stroke   Chronic diastolic heart failure (HCC)   Influenza A   CKD stage 3b, GFR 30-44 ml/min (HCC)   Patient Summary: [name] is a 63 y.o. with a pertinent PMH of HFpEF, hemorrhagic stroke, T2DM, gout  who presented with dyspnea and admitted on 02/20 for acute hypoxic respiratory failure from flu a on HD#1.   Acute hypoxic respiratory failure Influenza A She continues to be hypoxic with ambulatory saturations this morning, recovered with 2L. She does feel like she has good support at home. When RN worked with her this morning, he was concerned about safety at home. I have re consulted PT to see if she is remains appropriate for home health PT. I talked with patient and she would feel safer staying an additional night. -tamiflu 30 mg bid 2/5 days -supportive care -duonebs PRN -O2 with sat goal >92%  Syncope Mechanical fall Orthostatics negative after fluids. She was seen by PT/OT 2/21 who recommended home health PT. This morning, RN is concerned about safety at home. Will see if therapy can come back to see her.  -PT re-eval pending  AKI vs CKD AM labs pending. Creatinine improved yesterday after fluids.  -follow-up on AM labs. Lab holiday tomorrow if creatinine downtrend continued  Hypokalemia K improved to 4.3 2/21. AM labs pending.  Elevated AST AST 60 on admission, ALT 22. Likely in the setting of acute viral illness. Will leave to her outpatient doctor to evaluate.   Chronic stable conditions: HFpEF/ HTN-  holding entresto, BB, farxiga, and furosemide HLD- continue lipitor Chronic normocytic anemia Diabetes Hx of ICH- residual deficits with mild dysarthria and right sided  weakness OSA Gout GERD- continue PPI  Diet: Carb-Modified IVF: None,None VTE: Enoxaparin Code: Full PT/OT recs: Home Health, walker. Family Update:   Dispo: Anticipated discharge to Home tomorrow days pending PT evaluation.   Deandre Brannan M. Nelvin Tomb, D.O.  Internal Medicine Resident, PGY-3 [facility]  Pager: [phone] 9:39 AM, 05/17/2023   **Please contact the on call pager after 5 pm and on weekends at [phone].**

## 2023-05-17 NOTE — Plan of Care (Signed)
 Patient was very weak with ambulation today.  Slept well this afternoon and feels a bit better late in day.

## 2023-05-18 LAB — CBC
HCT: 36.8 % (ref 36.0–46.0)
Hemoglobin: 11.1 g/dL — ABNORMAL LOW (ref 12.0–15.0)
MCH: 24.3 pg — ABNORMAL LOW (ref 26.0–34.0)
MCHC: 30.2 g/dL (ref 30.0–36.0)
MCV: 80.7 fL (ref 80.0–100.0)
Platelets: 177 10*3/uL (ref 150–400)
RBC: 4.56 MIL/uL (ref 3.87–5.11)
RDW: 14.4 % (ref 11.5–15.5)
WBC: 4.7 10*3/uL (ref 4.0–10.5)
nRBC: 0 % (ref 0.0–0.2)

## 2023-05-18 LAB — GLUCOSE, CAPILLARY
Glucose-Capillary: 115 mg/dL — ABNORMAL HIGH (ref 70–99)
Glucose-Capillary: 113 mg/dL — ABNORMAL HIGH (ref 70–99)
Glucose-Capillary: 102 mg/dL — ABNORMAL HIGH (ref 70–99)

## 2023-05-18 MED ORDER — METOPROLOL TARTRATE 100 MG PO TABS
100.0000 mg | ORAL_TABLET | Freq: Two times a day (BID) | ORAL | Status: DC
  Administered 2023-05-18: 100 mg via ORAL
  Filled 2023-05-18: qty 1

## 2023-05-18 MED ORDER — OSELTAMIVIR PHOSPHATE 30 MG PO CAPS: 30.0000 mg | ORAL_CAPSULE | Freq: Two times a day (BID) | ORAL | 0 refills | Status: AC

## 2023-05-18 MED ORDER — OSELTAMIVIR PHOSPHATE 30 MG PO CAPS
30.0000 mg | ORAL_CAPSULE | Freq: Two times a day (BID) | ORAL | Status: DC
Start: 2023-05-18 — End: 2023-05-20
  Administered 2023-05-18: 30 mg via ORAL
  Filled 2023-05-18: qty 1

## 2023-05-18 MED ORDER — HYDROCORTISONE ACETATE 25 MG RE SUPP: 25.0000 mg | Freq: Two times a day (BID) | RECTAL | 0 refills | Status: AC

## 2023-05-18 MED ORDER — SITZ BATH MISC: 0 refills | Status: AC

## 2023-05-18 NOTE — Discharge Instructions (Signed)
 You were hospitalized for Acute Hypoxic Respiratory Failure due to Influenza A as well as an Acute Kidney Injury. Thank you for allowing [place] to be part of your care. I am glad that you are breathing better, [name]!  We have not arranged for follow up and ask that you call [facility] to set up an appointment within the next 2 weeks.   Please note these changes made to your medications:   *Please START taking:  Tamiflu 30 mg TWICE a day for 2 more days (24th and 25th)  *Please STOP taking:  Farxiga 10 mg daily until your follow up with [facility] Furosemide 20 mg daily until your follow up with [facility]  *Please CONTINUE taking: Tylenol every 6 hours as needed for fever Methocarbamol 750 mg twice daily Lipitor 20 mg daily Loratadine 10 mg daily Entresto 49-51 mg twice daily Metoprolol 100 twice daily Clonidine 0.1 mg Metformin 1000 mf daily Glipizide 5 mg daily Mounjaro - sundary 12.5 mg/weekly Flonase 50 mcg/ BID PRN Prilosec nightly 40mg    Please make sure to Follow up with [facility] to assess stopping oxygen at home and restarting your Farxiga 10 mg and Furosemide 20 mg.

## 2023-05-18 NOTE — Progress Notes (Signed)
 [name] to be discharged Home per MD order. Discussed prescriptions and follow up appointments with the patient. Prescriptions and  medication list explained in detail. Patient verbalized understanding.  Skin clean, dry and intact without evidence of skin break down, no evidence of skin tears noted. IV catheter discontinued intact. Site without signs and symptoms of complications. Dressing and pressure applied. Pt denies pain at the site currently. No complaints noted.  Patient free of lines, drains, and wounds.   An After Visit Summary (AVS) was printed and given to the patient. Patient escorted via wheelchair, and discharged home via private auto.  Rollator and oxygen was delivered to room before discharge to home  Arvilla Meres, [place]

## 2023-05-18 NOTE — Progress Notes (Signed)
 Physical Therapy Treatment Patient Details Name: [name] MRN: [id number] DOB: 06-27-1960 Today's Date: 05/18/2023   History of Present Illness Patient is a 63 yo female presenting to the ED with weakness, fatigue, and SOB on 05/15/23. Admitted with flu A. Patient fell on 2/19 at home and did hit her head, CT clear, MRI pending. PMH of HFpEF, hemorrhagic stroke, T2DM, Gout, HTN, HLT    PT Comments  Pt received in bed, continues with frequent cough. Resting HR 105 bpm with O2 sats upper 90's on 2L O2. Pt required increased time and effort to come to EOB from [facility] elevated but was able to accomplish without physical assist. Min A needed for first sit>stand, pt progressed to CGA with subsequent trials. Practiced multiple times from bed and recliner. Pt ambulated 10' and then needed seated rest break and then 10' more. HR elevated to 129 bpm, SPO2 92% on 2L, DOE 3/4. Pt having difficulty advancing LLE, possibly L knee OA? Recommend f/u as outpt for this. Recommend HHPT and wide rollator, PT will continue to follow.     If plan is discharge home, recommend the following: A little help with walking and/or transfers;A little help with bathing/dressing/bathroom;Assistance with cooking/housework;Assist for transportation;Help with stairs or ramp for entrance   Can travel by private vehicle        Equipment Recommendations  Rollator (4 wheels) (bariatric)    Recommendations for Other Services       Precautions / Restrictions Precautions Precautions: Fall Recall of Precautions/Restrictions: Intact Precaution/Restrictions Comments: watch O2 and HR Restrictions Weight Bearing Restrictions Per Provider Order: No     Mobility  Bed Mobility Overal bed mobility: Needs Assistance Bed Mobility: Rolling, Sidelying to Sit Rolling: Contact guard assist Sidelying to sit: Contact guard assist       General bed mobility comments: increased time needed to come to EOB from [facility] elevated but pt did  not need physical assist. SHe reports that at home she stays in a chair that is easier to get up from    Transfers Overall transfer level: Needs assistance Equipment used: Rolling walker (2 wheels) Transfers: Sit to/from Stand, Bed to chair/wheelchair/BSC Sit to Stand: Min assist, Contact guard assist   Step pivot transfers: Contact guard assist       General transfer comment: initially minA, completed with CGA by end of session. Practiced from bed and recliner    Ambulation/Gait Ambulation/Gait assistance: Contact guard assist Gait Distance (Feet): 10 Feet (2x) Assistive device: Rolling walker (2 wheels) Gait Pattern/deviations: Step-through pattern, Decreased stride length, Trunk flexed Gait velocity: decreased Gait velocity interpretation: <1.31 ft/sec, indicative of household ambulator   General Gait Details: difficulty advancing LLE, possibly from knee OA. Pt with increased WOB and HR to upper 120's. SPO2 92% on 2L O2. Pt needed seated rest break after each 10' due to fatigue.   Stairs             Wheelchair Mobility     Tilt Bed    Modified Rankin (Stroke Patients Only)       Balance Overall balance assessment: Needs assistance Sitting-balance support: No upper extremity supported, Feet unsupported Sitting balance-Leahy Scale: Fair     Standing balance support: Bilateral upper extremity supported, During functional activity, Reliant on assistive device for balance Standing balance-Leahy Scale: Poor Standing balance comment: reliant on RW, very unsteady without UE support  Communication Communication Communication: Impaired Factors Affecting Communication: Difficulty expressing self  Cognition Arousal: Alert Behavior During Therapy: Anxious (minimally)   PT - Cognitive impairments: Initiation, Problem solving                       PT - Cognition Comments: word finding difficulties exacerbated when she  is exerting herself and afterwards while her HR comes down Following commands: Intact      Cueing Cueing Techniques: Verbal cues, Gestural cues, Tactile cues  Exercises      General Comments General comments (skin integrity, edema, etc.): pt continues with frequent cough and some nausea this AM      Pertinent Vitals/Pain Pain Assessment Pain Assessment: Faces Faces Pain Scale: Hurts little more Pain Location: L knee Pain Descriptors / Indicators: Grimacing, Discomfort, Guarding Pain Intervention(s): Limited activity within patient's tolerance, Monitored during session    Home Living                          Prior Function            PT Goals (current goals can now be found in the care plan section) Acute Rehab PT Goals Patient Stated Goal: return home and be able to assist her brother PT Goal Formulation: With patient Time For Goal Achievement: 05/30/23 Potential to Achieve Goals: Good Progress towards PT goals: Progressing toward goals    Frequency    Min 1X/week      PT Plan      Co-evaluation              AM-PAC PT "6 Clicks" Mobility   Outcome Measure  Help needed turning from your back to your side while in a flat bed without using bedrails?: None Help needed moving from lying on your back to sitting on the side of a flat bed without using bedrails?: None Help needed moving to and from a bed to a chair (including a wheelchair)?: A Little Help needed standing up from a chair using your arms (e.g., wheelchair or bedside chair)?: A Little Help needed to walk in hospital room?: Total (<20 ft) Help needed climbing 3-5 steps with a railing? : Total 6 Click Score: 16    End of Session Equipment Utilized During Treatment: Gait belt;Oxygen Activity Tolerance: Patient limited by fatigue Patient left: in chair;with call bell/phone within reach;with chair alarm set Nurse Communication: Mobility status PT Visit Diagnosis: Unsteadiness on feet  (R26.81);Other abnormalities of gait and mobility (R26.89);Muscle weakness (generalized) (M62.81)     Time: 0712-0759 PT Time Calculation (min) (ACUTE ONLY): 47 min  Charges:    $Gait Training: 8-22 mins $Therapeutic Activity: 23-37 mins PT General Charges $$ ACUTE PT VISIT: 1 Visit                     Lyanne Co, PT  Acute Rehab Services Secure chat preferred Office [phone]    Lawana Chambers Longino Trefz 05/18/2023, 9:25 AM

## 2023-05-18 NOTE — TOC Transition Note (Signed)
 Transition of Care [facility]) - Discharge Note   Patient Details  Name: [name] MRN: [id number] Date of Birth: December 25, 1960  Transition of [facility]) CM/SW Contact:  Ronny Bacon, RN Phone Number: 05/18/2023, 4:12 PM   Clinical Narrative:   Secure message from provider regarding patient needing home oxygen and rollator. DME ordered from [place] with [employer] to be delivered to bedside at discharge.    Final next level of care: Home/Self Care Barriers to Discharge: No Barriers Identified   Patient Goals and CMS Choice            Discharge Placement                       Discharge Plan and Services Additional resources added to the After Visit Summary for                  DME Arranged: Walker rolling with seat, Oxygen DME Agency: [employer] Date DME Agency Contacted: 05/18/23 Time DME Agency Contacted: [phone] Representative spoke with at DME Agency: Vaughan Basta            Social Drivers of Health (SDOH) Interventions SDOH Screenings   Food Insecurity: No Food Insecurity (05/16/2023)  Housing: Low Risk  (05/16/2023)  Transportation Needs: No Transportation Needs (05/16/2023)  Utilities: Not At Risk (05/16/2023)  Alcohol Screen: Low Risk  (12/06/2020)  Depression (PHQ2-9): Low Risk  (12/18/2020)  Recent Concern: Depression (PHQ2-9) - Medium Risk (12/06/2020)  Financial Resource Strain: Low Risk  (12/06/2020)  Physical Activity: Inactive (12/06/2020)  Social Connections: Moderately Integrated (05/15/2023)  Stress: No Stress Concern Present (12/06/2020)  Tobacco Use: Low Risk  (05/15/2023)     Readmission Risk Interventions     No data to display

## 2023-05-18 NOTE — Discharge Summary (Signed)
 Name: [name] MRN: [id number] DOB: Jan 29, 1961 63 y.o. PCP: System, Provider Not In  Date of Admission: 05/15/2023  3:11 PM Date of Discharge:  05/18/23 Attending Physician: Dr. Antony Contras  DISCHARGE DIAGNOSIS:  Primary Problem: Acute hypoxic respiratory failure [facility])   Hospital Problems: Principal Problem:   Acute hypoxic respiratory failure (HCC) Active Problems:   Hypokalemia   Type 2 diabetes mellitus with hyperlipidemia (HCC)   Obstructive sleep apnea   Empty sella (HCC)   Gait disturbance, post-stroke   Chronic diastolic heart failure (HCC)   Influenza A   CKD stage 3b, GFR 30-44 ml/min (HCC)    DISCHARGE MEDICATIONS:   Allergies as of 05/18/2023       Reactions   Bee Venom Swelling   Facial swelling   Egg-derived Products Nausea And Vomiting, Swelling   Mouth swelled   Gabapentin Other (See Comments)   Jittery feeling, dyspnea, crawling sensation   Gadolinium Derivatives Itching   Immediately after gad injection pt. complained of tongue numbness and lower lip/ then had rt side of body tingling/ pt. Was assessed by Dr. Mosetta Putt before leaving/ no medications were administered/        Medication List     PAUSE taking these medications    Farxiga 5 MG Tabs tablet Wait to take this until your doctor or other care provider tells you to start again. Generic drug: dapagliflozin propanediol Take 5 mg by mouth daily.   furosemide 20 MG tablet Wait to take this until your doctor or other care provider tells you to start again. Commonly known as: LASIX Take 1 tablet (20 mg total) by mouth daily.       TAKE these medications    Accu-Chek FastClix Lancets Misc Check blood sugar up to 3 times daily. E11.59   Accu-Chek Guide Me w/Device Kit 1 kit by Does not apply route daily. Check blood sugar up to 3 times daily. E11.59   Accu-Chek Guide test strip Generic drug: glucose blood Check blood sugar up to 3 times daily. E11.59   acetaminophen 325 MG  tablet Commonly known as: TYLENOL Take 2 tablets (650 mg total) by mouth every 6 (six) hours as needed for mild pain (or Fever >/= 101).   AMBULATORY NON FORMULARY MEDICATION Incentive Spirometer , QTY 1- Use as directed.   atorvastatin 20 MG tablet Commonly known as: LIPITOR Take 1 tablet (20 mg total) by mouth daily.   cloNIDine 0.1 MG tablet Commonly known as: CATAPRES Take 1 tablet (0.1 mg total) by mouth 2 (two) times daily for hypertension.   diphenhydrAMINE 25 MG tablet Commonly known as: BENADRYL Take 25 mg by mouth at bedtime as needed for itching or allergies.   Entresto 49-51 MG Generic drug: sacubitril-valsartan Take 1 tablet by mouth 2 (two) times daily for high blood pressure   fluticasone 50 MCG/ACT nasal spray Commonly known as: FLONASE Place 2 sprays into both nostrils daily. What changed:  when to take this reasons to take this   glipiZIDE 5 MG tablet Commonly known as: GLUCOTROL Take 0.5 tablets (2.5 mg total) by mouth 2 (two) times daily before a meal. What changed:  how much to take when to take this   hydrocortisone 25 MG suppository Commonly known as: ANUSOL-HC Place 1 suppository (25 mg total) rectally 2 (two) times daily for 7 days.   loratadine 10 MG tablet Commonly known as: CLARITIN Take 10 mg by mouth daily as needed for allergies.   meclizine 25 MG tablet Commonly known  as: ANTIVERT Take 25 mg by mouth 2 (two) times daily as needed for dizziness.   metformin 1000 MG (OSM) 24 hr tablet Commonly known as: FORTAMET Take 1,000 mg by mouth daily with breakfast.   methocarbamol 750 MG tablet Commonly known as: ROBAXIN Take 1 tablet (750 mg total) by mouth 2 (two) times daily as needed.   metoprolol tartrate 100 MG tablet Commonly known as: LOPRESSOR Take 100 mg by mouth 2 (two) times daily.   Mounjaro 12.5 MG/0.5ML Pen Generic drug: tirzepatide Inject 12.5 mg into the skin once a week.   omeprazole 40 MG capsule Commonly known  as: PRILOSEC Take 1 capsule (40 mg total) by mouth daily.   oseltamivir 30 MG capsule Commonly known as: TAMIFLU Take 1 capsule (30 mg total) by mouth 2 (two) times daily for 5 doses.   Sitz Bath Misc Please use this kit to sit in hot water at least once daily for the next week               Durable Medical Equipment  (From admission, onward)           Start     Ordered   05/18/23 0919  For home use only DME oxygen  Once       Question Answer Comment  Length of Need Lifetime   Mode or (Route) Nasal cannula   Oxygen delivery system Gas      05/18/23 0918   05/18/23 0830  For home use only DME 4 wheeled rolling walker with seat  Once       Question:  Patient needs a walker to treat with the following condition  Answer:  Weakness   05/18/23 0829            DISPOSITION AND FOLLOW-UP:  [name] was discharged from [facility] in Stable condition. At the hospital follow up visit please address:  [name] was discharged from [facility] in Stable condition.  At the hospital follow up visit please address:   1.  Home 2L Oxygen requirement   2.  Labs / imaging needed at time of follow-up: N/a   3.  Pending labs/ test needing follow-up: CBC    Follow-up Appointments: Patient is calling [facility] to set an appointment   Hospital Course by problem list: 1. Acute hypoxic respiratory failure  Influenza A  HFpEF Patient presented w/ SpO2 in 70's requiring 3L [place] and duonebs. Flu A+ so started on tamiflu. CXR showed vascular congestion, so she received Lasix x1 that was discontinued due to overall hypovolemic status and repeat TTecho showing EF 60-65% with grade 1 LV diastolic dysfunction, slightly improved from 2023 echo. Hypokalemia was repleted accordingly. She was discharged w/ home O2 once ambulatory sats were >90% on 2L with scant wheezing on lower lung fields.  2. Syncope with fall Head CT and L Knee XR  negative. Pain improved by discharge. Per PT recs, discharged with DME Walker w/ seat. 3. Acute Kidney Injury superimposed on Chronic Kidney Disease 3b Admission Cr of 1.78 (no baseline records from [facility], but history of CKD3b noted in chart) normalized to 1.17 prior to discharge w/ IV fluids. Attributed to prerenal etiology. 4. External hemorrhoids  Patient was given one week course of hydrocortisone suppository and sitz bath. Please reassess at outpatient hospital follow-up appointment   HOSPITAL COURSE:  Patient Summary: #Acute hypoxic respiratory failure #Influenza A sepsis Presented with fever, tachycardia, hypertension, and hypoxemia.  Tested positive for influenza A.  Given fluids with subsequent improvement in MAP.  Chest x-ray demonstrated no overt consolidation or interstitial disease.  Required supplemental oxygen throughout stay. Provided with supplemental oxygen for use after discharge and to be follow-up outpatient. She was started on Tamiflu.  Follow-up outpatient with new O2 requirements and completion of her Tamiflu course.  #Syncope #Mechanical fall Likely secondary from hypovolemia from influenza A and poor p.o. intake.  Orthostatics negative after fluids.  PT recommended home health PT.  Patient will also be discharged with rolling walker for improved safety.  #Elevated AST Likely secondary to acute viral illness.  Follow-up outpatient.   DISCHARGE INSTRUCTIONS:   Discharge Instructions     (HEART FAILURE PATIENTS) Call MD:  Anytime you have any of the following symptoms: 1) 3 pound weight gain in 24 hours or 5 pounds in 1 week 2) shortness of breath, with or without a dry hacking cough 3) swelling in the hands, feet or stomach 4) if you have to sleep on extra pillows at night in order to breathe.   Complete by: As directed    Call MD for:  difficulty breathing, headache or visual disturbances   Complete by: As directed    Call MD for:  extreme fatigue   Complete by:  As directed    Call MD for:  hives   Complete by: As directed    Call MD for:  persistant dizziness or light-headedness   Complete by: As directed    Call MD for:  persistant nausea and vomiting   Complete by: As directed    Call MD for:  redness, tenderness, or signs of infection (pain, swelling, redness, odor or green/yellow discharge around incision site)   Complete by: As directed    Call MD for:  severe uncontrolled pain   Complete by: As directed    Call MD for:  temperature >100.4   Complete by: As directed    Diet - low sodium heart healthy   Complete by: As directed    Discharge instructions   Complete by: As directed    Discharge Instructions     You were hospitalized for Acute Hypoxic Respiratory Failure due to  Influenza A as well as an Acute Kidney Injury. Thank you for allowing [place] to be part of your care. I am glad that you are breathing better, Mrs. Galster!  We have not arranged for follow up and ask that you call [facility] to set up an appointment within the next 2 weeks.   Please note these changes made to your medications:   *Please START taking:  Tamiflu 30 mg TWICE a day for 2 more days (24th and 25th) for 5 more doses Hydrocortisone suppository two times per day for seven days  Sitz bath once per day for the next week   *Please STOP taking:  Farxiga 5 mg daily until your follow up with [facility] Furosemide 20 mg daily until your follow up with [facility]  Please continue taking your other medications as prescribed.  Please make sure to Follow up with [facility] to assess stopping  oxygen at home and restarting your Farxiga 10 mg and Furosemide 20 mg.   Increase activity slowly   Complete by: As directed        SUBJECTIVE:  Patient was evaluated at bedside. Continues to feel weak but denies any shortness of breath, chest pain, or other concerns.  Discharge Vitals:   BP  129/87 (BP Location: Left Arm)   Pulse (!) 106    Temp 98.2 F (36.8 C) (Oral)   Resp 20   Ht 5' (1.524 m)   Wt 122 kg   LMP 02/17/2011 (Exact Date)   SpO2 96%   BMI 52.53 kg/m   OBJECTIVE:  Physical Exam HENT:     Head: Normocephalic and atraumatic.     Ears:     Comments: Hearing normal to conversation    Nose:     Comments: Nasal cannula in place Cardiovascular:     Rate and Rhythm: Regular rhythm.     Heart sounds: Normal heart sounds.     Comments: Rate is borderline tachycardic Pulmonary:     Effort: Pulmonary effort is normal.     Comments: Scant wheeze heard at lower lung fields bilaterally, normal breath sounds at the apices. SpO2 92-93% on room air at rest while conversing. Musculoskeletal:        General: No tenderness.     Comments: Sitting up in bedside chair, moves all four extremities spontaneously  Neurological:     Mental Status: She is alert and oriented to person, place, and time. Mental status is at baseline.     Comments: Dysarthric at baseline, able to form words but speech is slow  Psychiatric:        Mood and Affect: Mood normal.        Behavior: Behavior normal.   Pertinent Labs, Studies, and Procedures:     Latest Ref Rng & Units 05/17/2023   10:32 AM 05/16/2023    5:29 AM 05/15/2023    9:37 PM  CBC  WBC 4.0 - 10.5 K/uL 5.2  4.4  3.8   Hemoglobin 12.0 - 15.0 g/dL 13.0  86.5  9.8   Hematocrit 36.0 - 46.0 % 38.2  34.9  32.6   Platelets 150 - 400 K/uL 177  187  197        Latest Ref Rng & Units 05/17/2023   10:32 AM 05/16/2023   12:55 PM 05/16/2023    5:29 AM  CMP  Glucose 70 - 99 mg/dL 784   92   BUN 8 - 23 mg/dL 13   15   Creatinine 6.96 - 1.00 mg/dL 2.95   2.84   Sodium 132 - 145 mmol/L 137   142   Potassium 3.5 - 5.1 mmol/L 4.1  4.3  3.3   Chloride 98 - 111 mmol/L 107   105   CO2 22 - 32 mmol/L 21   25   Calcium 8.9 - 10.3 mg/dL 8.0   7.7   Total Protein 6.5 - 8.1 g/dL   7.0   Total Bilirubin 0.0 - 1.2 mg/dL   0.6   Alkaline Phos 38 - 126 U/L   43   AST 15 - 41 U/L   56   ALT 0 -  44 U/L   21     ECHOCARDIOGRAM COMPLETE Result Date: 05/16/2023    ECHOCARDIOGRAM REPORT   Patient Name:   LUANNE KRZYZANOWSKI Brumett Date of Exam: 05/16/2023 Medical Rec #:  [id number]      Height:       60.0 in Accession #:    [id number]     Weight:       278.0 lb Date of Birth:  Sep 24, 1960      BSA:          2.147 m Patient Age:    52 years  BP:           123/65 mmHg Patient Gender: F              HR:           90 bpm. Exam Location:  Inpatient Procedure: 2D Echo, Cardiac Doppler, Color Doppler and Intracardiac            Opacification Agent (Both Spectral and Color Flow Doppler were            utilized during procedure). Indications:    I50.40* Unspecified combined systolic (congestive) and diastolic                 (congestive) heart failure; R06.02 SOB  History:        Patient has prior history of Echocardiogram examinations, most                 recent 10/05/2021. CHF; Risk Factors:Diabetes, Sleep Apnea and                 Hypertension. FLU positive. ICH.  Sonographer:    Sheralyn Boatman RDCS Referring Phys: [id number] [facility]  Sonographer Comments: Technically difficult study due to poor echo windows and patient is obese. Image acquisition challenging due to patient body habitus. IMPRESSIONS  1. Left ventricular ejection fraction, by estimation, is 65 to 70%. The left ventricle has normal function. The left ventricle has no regional wall motion abnormalities. Left ventricular diastolic parameters are consistent with Grade I diastolic dysfunction (impaired relaxation).  2. Right ventricular systolic function is normal. The right ventricular size is normal.  3. Left atrial size was mildly dilated.  4. The mitral valve is normal in structure. Trivial mitral valve regurgitation. No evidence of mitral stenosis.  5. The aortic valve is tricuspid. There is mild calcification of the aortic valve. Aortic valve regurgitation is not visualized. No aortic stenosis is present.  6. The inferior vena cava is normal in size with  greater than 50% respiratory variability, suggesting right atrial pressure of 3 mmHg. Conclusion(s)/Recommendation(s): Limited images due to poor sound wave transmission. FINDINGS  Left Ventricle: Left ventricular ejection fraction, by estimation, is 65 to 70%. The left ventricle has normal function. The left ventricle has no regional wall motion abnormalities. Definity contrast agent was given IV to delineate the left ventricular  endocardial borders. Strain imaging was not performed. The left ventricular internal cavity size was normal in size. There is no left ventricular hypertrophy. Left ventricular diastolic parameters are consistent with Grade I diastolic dysfunction (impaired relaxation). Right Ventricle: The right ventricular size is normal. No increase in right ventricular wall thickness. Right ventricular systolic function is normal. Left Atrium: Left atrial size was mildly dilated. Right Atrium: Right atrial size was normal in size. Pericardium: There is no evidence of pericardial effusion. Mitral Valve: The mitral valve is normal in structure. Trivial mitral valve regurgitation. No evidence of mitral valve stenosis. Tricuspid Valve: The tricuspid valve is normal in structure. Tricuspid valve regurgitation is trivial. No evidence of tricuspid stenosis. Aortic Valve: The aortic valve is tricuspid. There is mild calcification of the aortic valve. Aortic valve regurgitation is not visualized. No aortic stenosis is present. Pulmonic Valve: The pulmonic valve was normal in structure. Pulmonic valve regurgitation is not visualized. No evidence of pulmonic stenosis. Aorta: The aortic root is normal in size and structure. Venous: The inferior vena cava is normal in size with greater than 50% respiratory variability, suggesting right atrial pressure of 3 mmHg. IAS/Shunts: No atrial  level shunt detected by color flow Doppler. Additional Comments: 3D imaging was not performed.  LEFT VENTRICLE PLAX 2D LVIDd:          5.00 cm     Diastology LVIDs:         2.60 cm     LV e' medial:    7.30 cm/s LV PW:         1.20 cm     LV E/e' medial:  11.5 LV IVS:        1.00 cm     LV e' lateral:   5.28 cm/s LVOT diam:     2.20 cm     LV E/e' lateral: 15.8 LV SV:         61 LV SV Index:   28 LVOT Area:     3.80 cm  LV Volumes (MOD) LV vol d, MOD A2C: 97.1 ml LV vol d, MOD A4C: 97.7 ml LV vol s, MOD A2C: 33.3 ml LV vol s, MOD A4C: 43.6 ml LV SV MOD A2C:     63.8 ml LV SV MOD A4C:     97.7 ml LV SV MOD BP:      59.2 ml RIGHT VENTRICLE             IVC RV S prime:     12.70 cm/s  IVC diam: 1.00 cm TAPSE (M-mode): 1.5 cm LEFT ATRIUM             Index        RIGHT ATRIUM           Index LA diam:        4.40 cm 2.05 cm/m   RA Area:     13.50 cm LA Vol (A2C):   42.3 ml 19.70 ml/m  RA Volume:   26.80 ml  12.48 ml/m LA Vol (A4C):   34.2 ml 15.93 ml/m LA Biplane Vol: 39.6 ml 18.44 ml/m  AORTIC VALVE LVOT Vmax:   108.00 cm/s LVOT Vmean:  66.100 cm/s LVOT VTI:    0.160 m  AORTA Ao Asc diam: 2.90 cm MITRAL VALVE MV Area (PHT): 4.28 cm    SHUNTS MV Decel Time: 177 msec    Systemic VTI:  0.16 m MV E velocity: 83.64 cm/s  Systemic Diam: 2.20 cm MV A velocity: 78.58 cm/s MV E/A ratio:  1.06 Arvilla Meres MD Electronically signed by Arvilla Meres MD Signature Date/Time: 05/16/2023/10:06:13 AM    Final    DG Knee Complete 4 Views Left Result Date: 05/15/2023 CLINICAL DATA:  Fall, knee pain EXAM: LEFT KNEE - COMPLETE 4+ VIEW COMPARISON:  None Available. FINDINGS: Mild degenerative changes with joint space narrowing and spurring most pronounced in the medial and patellofemoral compartments. No acute bony abnormality. Specifically, no fracture, subluxation, or dislocation. No joint effusion. IMPRESSION: No acute bony abnormality. Electronically Signed   By: Charlett Nose M.D.   On: 05/15/2023 22:25   CT HEAD WO CONTRAST ( ) Result Date: 05/15/2023 CLINICAL DATA:  Head trauma, minor, normal mental status (Age 48-64y) Fall from chair, hit head,  headache, history of brain bleed EXAM: CT HEAD WITHOUT CONTRAST TECHNIQUE: Contiguous axial images were obtained from the base of the skull through the vertex without intravenous contrast. RADIATION DOSE REDUCTION: This exam was performed according to the departmental dose-optimization program which includes automated exposure control, adjustment of the mA and/or kV according to patient size and/or use of iterative reconstruction technique. COMPARISON:  MRI head 03/02/2022 FINDINGS: Brain: Patchy and confluent areas  of decreased attenuation are noted throughout the deep and periventricular white matter of the cerebral hemispheres bilaterally, compatible with chronic microvascular ischemic disease. No evidence of large-territorial acute infarction. No parenchymal hemorrhage. No mass lesion. No extra-axial collection. No mass effect or midline shift. No hydrocephalus. Basilar cisterns are patent. Empty sella. Vascular: No hyperdense vessel. Skull: No acute fracture or focal lesion. Sinuses/Orbits: Left maxillary sinus and right sphenoid sinus mucosal thickening. Otherwise paranasal sinuses and mastoid air cells are clear. The orbits are unremarkable. Other: None. IMPRESSION: 1. No acute intracranial abnormality. 2. Empty sella. Findings is often a normal anatomic variant but can be associated with idiopathic intracranial hypertension (pseudotumor cerebri). Electronically Signed   By: Tish Frederickson M.D.   On: 05/15/2023 21:53   US RENAL Result Date: 05/15/2023 CLINICAL DATA:  [id number] AKI (acute kidney injury) (HCC) [id number] EXAM: RENAL / URINARY TRACT ULTRASOUND COMPLETE COMPARISON:  None Available. FINDINGS: Right Kidney: Renal measurements: 10.3 x 4.5 x 4.3 cm = volume: 105 mL. Echogenicity within normal limits. No mass or hydronephrosis visualized. Left Kidney: Renal measurements: 12 x 4.8 x 4.5 cm = volume: 134 mL. Echogenicity within normal limits. No mass or hydronephrosis visualized. Urinary bladder: Appears  normal for degree of bladder distention. Other: None. IMPRESSION: Unremarkable renal ultrasound. Electronically Signed   By: Tish Frederickson M.D.   On: 05/15/2023 21:51   DG Chest 2 View Result Date: 05/15/2023 CLINICAL DATA:  Dizziness.  Possible syncope. EXAM: CHEST - 2 VIEW COMPARISON:  12/01/2021 FINDINGS: Stable enlarged cardiac silhouette and prominent pulmonary vasculature. No airspace consolidation. Unremarkable bones with overlying bra artifacts noted. IMPRESSION: Cardiomegaly and pulmonary vascular congestion. Electronically Signed   By: Beckie Salts M.D.   On: 05/15/2023 15:51     Signed: Morrie Sheldon, MD Internal Medicine Resident, PGY-1 [facility]  Pager: [phone]

## 2023-06-02 ENCOUNTER — Other Ambulatory Visit (HOSPITAL_COMMUNITY): Payer: Self-pay

## 2023-06-02 MED ORDER — MOUNJARO 15 MG/0.5ML ~~LOC~~ SOAJ
15.0000 mg | SUBCUTANEOUS | 3 refills | Status: AC
  Filled 2023-06-02: qty 6, 84d supply, fill #0
  Filled 2023-10-19: qty 6, 84d supply, fill #1
  Filled 2023-12-31: qty 6, 84d supply, fill #2
  Filled 2024-04-04: qty 6, 84d supply, fill #3

## 2023-06-03 ENCOUNTER — Other Ambulatory Visit (HOSPITAL_COMMUNITY): Payer: Self-pay

## 2023-07-09 ENCOUNTER — Other Ambulatory Visit: Payer: Self-pay

## 2023-07-09 ENCOUNTER — Other Ambulatory Visit (HOSPITAL_COMMUNITY): Payer: Self-pay

## 2023-07-14 ENCOUNTER — Other Ambulatory Visit (HOSPITAL_COMMUNITY): Payer: Self-pay

## 2023-07-14 MED ORDER — COLCHICINE 0.6 MG PO TABS
ORAL_TABLET | ORAL | 0 refills | Status: AC
Start: 2023-07-14 — End: ?
  Filled 2023-07-14: qty 15, 30d supply, fill #0

## 2023-07-14 MED ORDER — INDOMETHACIN 50 MG PO CAPS
50.0000 mg | ORAL_CAPSULE | Freq: Three times a day (TID) | ORAL | 0 refills | Status: AC
Start: 2023-07-14 — End: ?
  Filled 2023-07-14: qty 21, 7d supply, fill #0

## 2023-07-25 ENCOUNTER — Other Ambulatory Visit (HOSPITAL_COMMUNITY): Payer: Self-pay

## 2023-07-28 ENCOUNTER — Other Ambulatory Visit (HOSPITAL_COMMUNITY): Payer: Self-pay

## 2023-07-28 MED ORDER — METHOCARBAMOL 750 MG PO TABS
750.0000 mg | ORAL_TABLET | Freq: Two times a day (BID) | ORAL | 5 refills | Status: DC | PRN
  Filled 2023-07-28: qty 20, 10d supply, fill #0
  Filled 2023-08-31: qty 20, 10d supply, fill #1
  Filled 2023-09-18: qty 20, 10d supply, fill #2
  Filled 2023-10-06: qty 20, 10d supply, fill #3
  Filled 2023-10-31: qty 20, 10d supply, fill #4
  Filled 2023-11-28: qty 20, 10d supply, fill #5

## 2023-08-19 ENCOUNTER — Other Ambulatory Visit (HOSPITAL_COMMUNITY): Payer: Self-pay

## 2023-08-31 ENCOUNTER — Other Ambulatory Visit (HOSPITAL_COMMUNITY): Payer: Self-pay

## 2023-09-01 ENCOUNTER — Other Ambulatory Visit (HOSPITAL_COMMUNITY): Payer: Self-pay

## 2023-09-01 MED ORDER — SACUBITRIL-VALSARTAN 49-51 MG PO TABS
1.0000 | ORAL_TABLET | Freq: Two times a day (BID) | ORAL | 3 refills | Status: AC
  Filled 2023-09-01: qty 180, 90d supply, fill #0

## 2023-09-24 ENCOUNTER — Other Ambulatory Visit (HOSPITAL_COMMUNITY): Payer: Self-pay

## 2023-09-24 MED ORDER — OMEPRAZOLE 40 MG PO CPDR
40.0000 mg | DELAYED_RELEASE_CAPSULE | Freq: Every day | ORAL | 3 refills | Status: AC
  Filled 2023-09-24 – 2023-09-29 (×2): qty 90, 90d supply, fill #0

## 2023-09-29 ENCOUNTER — Other Ambulatory Visit (HOSPITAL_COMMUNITY): Payer: Self-pay

## 2023-09-30 ENCOUNTER — Other Ambulatory Visit (HOSPITAL_COMMUNITY): Payer: Self-pay

## 2023-10-07 ENCOUNTER — Other Ambulatory Visit (HOSPITAL_COMMUNITY): Payer: Self-pay

## 2023-10-31 ENCOUNTER — Other Ambulatory Visit (HOSPITAL_COMMUNITY): Payer: Self-pay

## 2023-10-31 MED ORDER — SACUBITRIL-VALSARTAN 49-51 MG PO TABS
1.0000 | ORAL_TABLET | Freq: Two times a day (BID) | ORAL | 4 refills | Status: AC
  Filled 2023-11-25: qty 180, 90d supply, fill #0
  Filled 2024-03-01: qty 180, 90d supply, fill #1

## 2023-11-11 ENCOUNTER — Other Ambulatory Visit (HOSPITAL_COMMUNITY): Payer: Self-pay

## 2023-11-11 MED ORDER — POLYETHYLENE GLYCOL 3350 17 GM/SCOOP PO POWD
17.0000 g | Freq: Every day | ORAL | 11 refills | Status: AC
  Filled 2023-11-11: qty 238, 14d supply, fill #0

## 2023-11-13 ENCOUNTER — Other Ambulatory Visit (HOSPITAL_COMMUNITY): Payer: Self-pay

## 2023-11-14 ENCOUNTER — Other Ambulatory Visit (HOSPITAL_COMMUNITY): Payer: Self-pay

## 2023-11-14 MED ORDER — DAPAGLIFLOZIN PROPANEDIOL 10 MG PO TABS
10.0000 mg | ORAL_TABLET | Freq: Every day | ORAL | 3 refills | Status: AC
  Filled 2023-11-14: qty 90, 90d supply, fill #0
  Filled 2024-03-15: qty 90, 90d supply, fill #1

## 2023-11-25 ENCOUNTER — Other Ambulatory Visit (HOSPITAL_COMMUNITY): Payer: Self-pay

## 2023-12-21 ENCOUNTER — Other Ambulatory Visit (HOSPITAL_COMMUNITY): Payer: Self-pay

## 2023-12-22 ENCOUNTER — Other Ambulatory Visit (HOSPITAL_COMMUNITY): Payer: Self-pay

## 2023-12-22 MED ORDER — METHOCARBAMOL 750 MG PO TABS
750.0000 mg | ORAL_TABLET | Freq: Two times a day (BID) | ORAL | 2 refills | Status: DC | PRN
  Filled 2023-12-22: qty 20, 10d supply, fill #0
  Filled 2024-01-05: qty 20, 10d supply, fill #1
  Filled 2024-02-02: qty 20, 10d supply, fill #2

## 2023-12-31 ENCOUNTER — Other Ambulatory Visit (HOSPITAL_COMMUNITY): Payer: Self-pay

## 2023-12-31 MED ORDER — ATORVASTATIN CALCIUM 20 MG PO TABS
20.0000 mg | ORAL_TABLET | Freq: Every day | ORAL | 3 refills | Status: AC
  Filled 2023-12-31: qty 90, 90d supply, fill #0

## 2024-01-01 ENCOUNTER — Other Ambulatory Visit (HOSPITAL_COMMUNITY): Payer: Self-pay

## 2024-01-01 MED ORDER — ATORVASTATIN CALCIUM 20 MG PO TABS
20.0000 mg | ORAL_TABLET | Freq: Every day | ORAL | 3 refills | Status: AC
  Filled 2024-01-01: qty 90, 90d supply, fill #0

## 2024-01-06 ENCOUNTER — Other Ambulatory Visit (HOSPITAL_COMMUNITY): Payer: Self-pay

## 2024-02-23 ENCOUNTER — Other Ambulatory Visit (HOSPITAL_COMMUNITY): Payer: Self-pay

## 2024-02-23 MED ORDER — METHOCARBAMOL 750 MG PO TABS
750.0000 mg | ORAL_TABLET | Freq: Two times a day (BID) | ORAL | 2 refills | Status: DC | PRN
  Filled 2024-02-23: qty 20, 10d supply, fill #0
  Filled 2024-03-08: qty 20, 10d supply, fill #1
  Filled 2024-03-31: qty 20, 10d supply, fill #2

## 2024-02-24 ENCOUNTER — Other Ambulatory Visit (HOSPITAL_COMMUNITY): Payer: Self-pay

## 2024-04-08 ENCOUNTER — Other Ambulatory Visit (HOSPITAL_COMMUNITY): Payer: Self-pay

## 2024-04-14 ENCOUNTER — Other Ambulatory Visit (HOSPITAL_COMMUNITY): Payer: Self-pay

## 2024-04-22 ENCOUNTER — Other Ambulatory Visit (HOSPITAL_COMMUNITY): Payer: Self-pay

## 2024-04-22 MED ORDER — METHOCARBAMOL 750 MG PO TABS
750.0000 mg | ORAL_TABLET | Freq: Two times a day (BID) | ORAL | 2 refills | Status: AC | PRN
  Filled 2024-04-22: qty 20, 10d supply, fill #0

## 2024-04-23 ENCOUNTER — Other Ambulatory Visit (HOSPITAL_COMMUNITY): Payer: Self-pay
